# Patient Record
Sex: Female | Born: 1935 | Race: White | Hispanic: No | Smoking: Former smoker
Health system: Southern US, Community
[De-identification: ages and names within clinical notes are randomized; demographics above are authoritative.]

## PROBLEM LIST (undated history)

## (undated) DIAGNOSIS — M810 Age-related osteoporosis without current pathological fracture: Secondary | ICD-10-CM

## (undated) DIAGNOSIS — I10 Essential (primary) hypertension: Secondary | ICD-10-CM

## (undated) DIAGNOSIS — J189 Pneumonia, unspecified organism: Secondary | ICD-10-CM

## (undated) DIAGNOSIS — R410 Disorientation, unspecified: Secondary | ICD-10-CM

## (undated) DIAGNOSIS — Z9981 Dependence on supplemental oxygen: Secondary | ICD-10-CM

## (undated) DIAGNOSIS — K529 Noninfective gastroenteritis and colitis, unspecified: Secondary | ICD-10-CM

## (undated) DIAGNOSIS — S3210XA Unspecified fracture of sacrum, initial encounter for closed fracture: Secondary | ICD-10-CM

## (undated) DIAGNOSIS — M1990 Unspecified osteoarthritis, unspecified site: Secondary | ICD-10-CM

## (undated) DIAGNOSIS — S32000A Wedge compression fracture of unspecified lumbar vertebra, initial encounter for closed fracture: Secondary | ICD-10-CM

## (undated) DIAGNOSIS — J449 Chronic obstructive pulmonary disease, unspecified: Secondary | ICD-10-CM

## (undated) DIAGNOSIS — G8929 Other chronic pain: Secondary | ICD-10-CM

## (undated) DIAGNOSIS — M549 Dorsalgia, unspecified: Secondary | ICD-10-CM

## (undated) HISTORY — PX: ABDOMINAL HYSTERECTOMY: SHX81

---

## 1998-10-03 ENCOUNTER — Emergency Department (HOSPITAL_COMMUNITY): Admission: EM | Admit: 1998-10-03 | Discharge: 1998-10-03 | Payer: Self-pay | Admitting: Emergency Medicine

## 2000-03-31 ENCOUNTER — Encounter: Payer: Self-pay | Admitting: Emergency Medicine

## 2000-03-31 ENCOUNTER — Emergency Department (HOSPITAL_COMMUNITY): Admission: EM | Admit: 2000-03-31 | Discharge: 2000-03-31 | Payer: Self-pay | Admitting: Emergency Medicine

## 2000-04-01 ENCOUNTER — Emergency Department (HOSPITAL_COMMUNITY): Admission: EM | Admit: 2000-04-01 | Discharge: 2000-04-01 | Payer: Self-pay

## 2000-04-02 ENCOUNTER — Encounter: Payer: Self-pay | Admitting: Emergency Medicine

## 2000-04-02 ENCOUNTER — Emergency Department (HOSPITAL_COMMUNITY): Admission: EM | Admit: 2000-04-02 | Discharge: 2000-04-02 | Payer: Self-pay | Admitting: Emergency Medicine

## 2000-04-04 ENCOUNTER — Encounter: Payer: Self-pay | Admitting: Emergency Medicine

## 2000-04-04 ENCOUNTER — Emergency Department (HOSPITAL_COMMUNITY): Admission: EM | Admit: 2000-04-04 | Discharge: 2000-04-04 | Payer: Self-pay | Admitting: Emergency Medicine

## 2000-04-09 ENCOUNTER — Emergency Department (HOSPITAL_COMMUNITY): Admission: EM | Admit: 2000-04-09 | Discharge: 2000-04-09 | Payer: Self-pay | Admitting: Emergency Medicine

## 2000-06-05 ENCOUNTER — Encounter: Payer: Self-pay | Admitting: Emergency Medicine

## 2000-06-05 ENCOUNTER — Emergency Department (HOSPITAL_COMMUNITY): Admission: EM | Admit: 2000-06-05 | Discharge: 2000-06-05 | Payer: Self-pay | Admitting: Emergency Medicine

## 2001-05-25 ENCOUNTER — Emergency Department (HOSPITAL_COMMUNITY): Admission: EM | Admit: 2001-05-25 | Discharge: 2001-05-25 | Payer: Self-pay | Admitting: Emergency Medicine

## 2001-09-03 ENCOUNTER — Emergency Department (HOSPITAL_COMMUNITY): Admission: EM | Admit: 2001-09-03 | Discharge: 2001-09-03 | Payer: Self-pay | Admitting: Emergency Medicine

## 2001-09-03 ENCOUNTER — Encounter: Payer: Self-pay | Admitting: Emergency Medicine

## 2001-09-06 ENCOUNTER — Emergency Department (HOSPITAL_COMMUNITY): Admission: EM | Admit: 2001-09-06 | Discharge: 2001-09-06 | Payer: Self-pay

## 2001-09-11 ENCOUNTER — Emergency Department (HOSPITAL_COMMUNITY): Admission: EM | Admit: 2001-09-11 | Discharge: 2001-09-11 | Payer: Self-pay | Admitting: Emergency Medicine

## 2001-09-15 ENCOUNTER — Encounter: Payer: Self-pay | Admitting: Orthopedic Surgery

## 2001-09-15 ENCOUNTER — Ambulatory Visit (HOSPITAL_COMMUNITY): Admission: RE | Admit: 2001-09-15 | Discharge: 2001-09-15 | Payer: Self-pay | Admitting: Orthopedic Surgery

## 2001-10-02 ENCOUNTER — Emergency Department (HOSPITAL_COMMUNITY): Admission: EM | Admit: 2001-10-02 | Discharge: 2001-10-02 | Payer: Self-pay | Admitting: Emergency Medicine

## 2001-10-04 ENCOUNTER — Encounter: Admission: RE | Admit: 2001-10-04 | Discharge: 2001-10-04 | Payer: Self-pay | Admitting: Orthopedic Surgery

## 2001-10-04 ENCOUNTER — Encounter: Payer: Self-pay | Admitting: Orthopedic Surgery

## 2001-10-14 ENCOUNTER — Emergency Department (HOSPITAL_COMMUNITY): Admission: EM | Admit: 2001-10-14 | Discharge: 2001-10-14 | Payer: Self-pay

## 2001-10-18 ENCOUNTER — Encounter: Payer: Self-pay | Admitting: Orthopedic Surgery

## 2001-10-18 ENCOUNTER — Encounter: Admission: RE | Admit: 2001-10-18 | Discharge: 2001-10-18 | Payer: Self-pay | Admitting: Orthopedic Surgery

## 2001-10-21 ENCOUNTER — Emergency Department (HOSPITAL_COMMUNITY): Admission: EM | Admit: 2001-10-21 | Discharge: 2001-10-21 | Payer: Self-pay | Admitting: Emergency Medicine

## 2001-11-01 ENCOUNTER — Encounter: Admission: RE | Admit: 2001-11-01 | Discharge: 2001-11-01 | Payer: Self-pay | Admitting: Orthopedic Surgery

## 2001-11-01 ENCOUNTER — Encounter: Payer: Self-pay | Admitting: Orthopedic Surgery

## 2001-11-03 ENCOUNTER — Emergency Department (HOSPITAL_COMMUNITY): Admission: EM | Admit: 2001-11-03 | Discharge: 2001-11-03 | Payer: Self-pay | Admitting: Emergency Medicine

## 2001-11-09 ENCOUNTER — Emergency Department (HOSPITAL_COMMUNITY): Admission: EM | Admit: 2001-11-09 | Discharge: 2001-11-09 | Payer: Self-pay | Admitting: Emergency Medicine

## 2001-11-14 ENCOUNTER — Emergency Department (HOSPITAL_COMMUNITY): Admission: EM | Admit: 2001-11-14 | Discharge: 2001-11-14 | Payer: Self-pay | Admitting: Emergency Medicine

## 2001-11-21 ENCOUNTER — Emergency Department (HOSPITAL_COMMUNITY): Admission: EM | Admit: 2001-11-21 | Discharge: 2001-11-21 | Payer: Self-pay | Admitting: Emergency Medicine

## 2002-07-08 ENCOUNTER — Encounter: Payer: Self-pay | Admitting: Cardiology

## 2002-07-08 ENCOUNTER — Inpatient Hospital Stay (HOSPITAL_COMMUNITY): Admission: AD | Admit: 2002-07-08 | Discharge: 2002-07-10 | Payer: Self-pay | Admitting: Cardiology

## 2002-07-08 ENCOUNTER — Encounter: Payer: Self-pay | Admitting: Emergency Medicine

## 2002-07-09 ENCOUNTER — Encounter: Payer: Self-pay | Admitting: Cardiology

## 2002-08-14 ENCOUNTER — Emergency Department (HOSPITAL_COMMUNITY): Admission: EM | Admit: 2002-08-14 | Discharge: 2002-08-15 | Payer: Self-pay | Admitting: Emergency Medicine

## 2002-08-15 ENCOUNTER — Encounter: Payer: Self-pay | Admitting: Emergency Medicine

## 2002-08-16 ENCOUNTER — Emergency Department (HOSPITAL_COMMUNITY): Admission: EM | Admit: 2002-08-16 | Discharge: 2002-08-17 | Payer: Self-pay | Admitting: Emergency Medicine

## 2002-08-16 ENCOUNTER — Encounter: Payer: Self-pay | Admitting: Emergency Medicine

## 2003-02-24 ENCOUNTER — Encounter: Payer: Self-pay | Admitting: Emergency Medicine

## 2003-02-24 ENCOUNTER — Emergency Department (HOSPITAL_COMMUNITY): Admission: EM | Admit: 2003-02-24 | Discharge: 2003-02-24 | Payer: Self-pay | Admitting: Emergency Medicine

## 2004-01-13 ENCOUNTER — Encounter: Payer: Self-pay | Admitting: Emergency Medicine

## 2004-01-14 ENCOUNTER — Encounter (INDEPENDENT_AMBULATORY_CARE_PROVIDER_SITE_OTHER): Payer: Self-pay | Admitting: Cardiology

## 2004-01-14 ENCOUNTER — Inpatient Hospital Stay (HOSPITAL_COMMUNITY): Admission: EM | Admit: 2004-01-14 | Discharge: 2004-01-17 | Payer: Self-pay | Admitting: *Deleted

## 2004-01-25 ENCOUNTER — Inpatient Hospital Stay (HOSPITAL_COMMUNITY): Admission: EM | Admit: 2004-01-25 | Discharge: 2004-02-05 | Payer: Self-pay | Admitting: Psychiatry

## 2004-02-07 ENCOUNTER — Other Ambulatory Visit (HOSPITAL_COMMUNITY): Admission: RE | Admit: 2004-02-07 | Discharge: 2004-02-13 | Payer: Self-pay | Admitting: Psychiatry

## 2004-02-20 ENCOUNTER — Inpatient Hospital Stay (HOSPITAL_COMMUNITY): Admission: AD | Admit: 2004-02-20 | Discharge: 2004-02-29 | Payer: Self-pay | Admitting: Psychiatry

## 2004-02-22 ENCOUNTER — Encounter: Payer: Self-pay | Admitting: Psychiatry

## 2004-03-03 ENCOUNTER — Other Ambulatory Visit (HOSPITAL_COMMUNITY): Admission: RE | Admit: 2004-03-03 | Discharge: 2004-03-11 | Payer: Self-pay | Admitting: Psychiatry

## 2004-03-15 ENCOUNTER — Inpatient Hospital Stay (HOSPITAL_COMMUNITY): Admission: EM | Admit: 2004-03-15 | Discharge: 2004-03-25 | Payer: Self-pay | Admitting: Psychiatry

## 2004-03-15 ENCOUNTER — Emergency Department (HOSPITAL_COMMUNITY): Admission: EM | Admit: 2004-03-15 | Discharge: 2004-03-15 | Payer: Self-pay

## 2005-01-27 ENCOUNTER — Inpatient Hospital Stay (HOSPITAL_COMMUNITY): Admission: EM | Admit: 2005-01-27 | Discharge: 2005-02-02 | Payer: Self-pay | Admitting: Emergency Medicine

## 2005-02-16 ENCOUNTER — Inpatient Hospital Stay (HOSPITAL_COMMUNITY): Admission: EM | Admit: 2005-02-16 | Discharge: 2005-02-24 | Payer: Self-pay | Admitting: Emergency Medicine

## 2005-02-20 ENCOUNTER — Encounter: Payer: Self-pay | Admitting: Internal Medicine

## 2005-02-20 ENCOUNTER — Encounter (INDEPENDENT_AMBULATORY_CARE_PROVIDER_SITE_OTHER): Payer: Self-pay | Admitting: *Deleted

## 2005-03-02 ENCOUNTER — Ambulatory Visit: Payer: Self-pay | Admitting: Pulmonary Disease

## 2005-03-05 ENCOUNTER — Ambulatory Visit (HOSPITAL_COMMUNITY): Admission: RE | Admit: 2005-03-05 | Discharge: 2005-03-05 | Payer: Self-pay | Admitting: Interventional Radiology

## 2005-03-13 ENCOUNTER — Ambulatory Visit (HOSPITAL_COMMUNITY): Admission: RE | Admit: 2005-03-13 | Discharge: 2005-03-13 | Payer: Self-pay | Admitting: Interventional Radiology

## 2005-03-24 ENCOUNTER — Ambulatory Visit (HOSPITAL_COMMUNITY): Admission: RE | Admit: 2005-03-24 | Discharge: 2005-03-24 | Payer: Self-pay | Admitting: Interventional Radiology

## 2005-04-06 ENCOUNTER — Ambulatory Visit (HOSPITAL_COMMUNITY): Admission: RE | Admit: 2005-04-06 | Discharge: 2005-04-06 | Payer: Self-pay | Admitting: Interventional Radiology

## 2005-04-10 ENCOUNTER — Encounter: Admission: RE | Admit: 2005-04-10 | Discharge: 2005-04-10 | Payer: Self-pay | Admitting: Interventional Radiology

## 2005-11-15 ENCOUNTER — Emergency Department (HOSPITAL_COMMUNITY): Admission: EM | Admit: 2005-11-15 | Discharge: 2005-11-16 | Payer: Self-pay | Admitting: Emergency Medicine

## 2006-01-18 ENCOUNTER — Ambulatory Visit (HOSPITAL_COMMUNITY): Admission: RE | Admit: 2006-01-18 | Discharge: 2006-01-18 | Payer: Self-pay | Admitting: Interventional Radiology

## 2006-01-19 ENCOUNTER — Ambulatory Visit (HOSPITAL_COMMUNITY): Admission: RE | Admit: 2006-01-19 | Discharge: 2006-01-19 | Payer: Self-pay | Admitting: Interventional Radiology

## 2006-06-05 ENCOUNTER — Inpatient Hospital Stay (HOSPITAL_COMMUNITY): Admission: EM | Admit: 2006-06-05 | Discharge: 2006-06-11 | Payer: Self-pay | Admitting: Emergency Medicine

## 2007-05-21 ENCOUNTER — Ambulatory Visit: Payer: Self-pay | Admitting: *Deleted

## 2007-05-21 ENCOUNTER — Inpatient Hospital Stay (HOSPITAL_COMMUNITY): Admission: EM | Admit: 2007-05-21 | Discharge: 2007-05-24 | Payer: Self-pay | Admitting: Emergency Medicine

## 2008-05-08 ENCOUNTER — Emergency Department (HOSPITAL_COMMUNITY): Admission: EM | Admit: 2008-05-08 | Discharge: 2008-05-08 | Payer: Self-pay | Admitting: Emergency Medicine

## 2008-05-10 ENCOUNTER — Emergency Department (HOSPITAL_COMMUNITY): Admission: EM | Admit: 2008-05-10 | Discharge: 2008-05-10 | Payer: Self-pay | Admitting: Emergency Medicine

## 2008-05-14 ENCOUNTER — Encounter: Payer: Self-pay | Admitting: Interventional Radiology

## 2008-05-15 ENCOUNTER — Ambulatory Visit (HOSPITAL_COMMUNITY): Admission: RE | Admit: 2008-05-15 | Discharge: 2008-05-15 | Payer: Self-pay | Admitting: Interventional Radiology

## 2008-05-25 ENCOUNTER — Ambulatory Visit (HOSPITAL_COMMUNITY): Admission: RE | Admit: 2008-05-25 | Discharge: 2008-05-25 | Payer: Self-pay | Admitting: Interventional Radiology

## 2010-03-13 ENCOUNTER — Encounter: Payer: Self-pay | Admitting: Interventional Radiology

## 2011-01-18 ENCOUNTER — Encounter: Payer: Self-pay | Admitting: Interventional Radiology

## 2011-05-12 NOTE — Consult Note (Signed)
NAME:  GRIER, CZERWINSKI NO.:  [id number]   MEDICAL RECORD NO.:  [id number]          PATIENT TYPE:  INP   LOCATION:  1827                         FACILITY:  [facility]   PHYSICIAN:  Georga Hacking, M.D.DATE OF BIRTH:  [birth year]   DATE OF CONSULTATION:  DATE OF DISCHARGE:                                 CONSULTATION   Thank you for asking me to see this 75 year old female for evaluation of  chest pain.  The patient has a longstanding history of tobacco abuse,  cigarette abuse, and severe chronic low back pain with multiple  kyphoplasties.  She was evaluated by me in January, 2005 with  significant sharp substernal chest pain and arm pain.  During that  admission, she underwent a cardiac catheterization that showed mild-to-  moderate diffuse irregularity in the coronary arteries with no  significant obstructive stenoses noted and normal LV function.  Following that, she was later admitted to the psychiatry service for  depression in March of that year.  She has had admissions for  kyphoplasty and management of chronic back pain and about a year and a  half ago was admitted with severe shortness of breath and was diagnosed  with COPD and has been on home oxygen.  She states that she does not  feel well, she has no energy.  Complains of significant low back pain  and chronic shortness of breath but not much in the way of chest pain.  She lives with her son currently and complains of poor motivation to do  activities or other things.   She was awakened from sleep this morning at about 4:00 a.m. with a sharp  chest pain that she said would recur and last around 6 seconds and  recurred over the course of about 3-4 hours.  She had no sweating with  this and described it as sharp rather than pressure-like.  She complains  of some numbness involving the left arm, which frightened her, and she  was brought by EMS to the emergency room, where her symptoms gradually  subsided.   Her initial point of care enzymes and EKG have been negative  since then.  Her symptoms are not pleuritic and are not related to food.   Her past history is remarkable for hyperlipidemia and COPD.   PAST SURGICAL HISTORY:  Hysterectomy in the 1970s, bilateral  oophorectomy and appendectomy.   ALLERGIES:  None.   CURRENT MEDICATIONS:  She is on a couple of medicines for blood  pressure, she says.  Aspirin, atenolol, and evidently is on Cymbalta for  depression.  She evidently uses some inhalers also.   SOCIAL HISTORY:  She currently lives with her son.  She does not use  alcohol to excess.  She has smoked for a number of years and is  currently smoking around three cigarettes a day and is trying to reduce  the amount she is smoking.   FAMILY HISTORY:  Mother died at age 28 due to a brain tumor.  Father  died at age 33 of emphysema.  Three sisters, one sister with a  stroke at  age 48.  No family history of premature cardiac disease at an early age.  There is a family history of suicide and depression.   REVIEW OF SYSTEMS:  She has gained some weight.  She has significant  depressed and anxiety.  She has a chronic sleep disturbance.  She has  mild-to-moderate dyspnea on exertion.  She denies hematochezia,  hematemesis, gallbladder disease, or hiatal hernia but does have  occasional diarrhea.  She denies any urinary symptoms.  She has severe  chronic back pain and has had kyphoplasties done previously.   PHYSICAL EXAMINATION:  GENERAL:  On examination, she is an elderly woman  appearing her stated age.  VITAL SIGNS:  Blood pressure is currently 120/80.  Pulse was 76 and  regular.  SKIN:  Warm and dry.  HEENT:  EOMI.  PERRLA. C&S clear, Fundi not examined.  Pharynx negative.  NECK:  Supple without masses, JVD, thyromegaly, or bruits.  LUNGS:  Clear to A&P.  CARDIAC:  Normal S1 and S2.  No S3.  ABDOMEN:  Soft and nontender.  EXTREMITIES:  Femoral and distal pulses are 2+.  There is  no edema  noted.   A 12-lead EKG is normal.   Initial laboratory data shows normal CBC.  The pCO2 is 54.  Potassium is  2.9.  Glucose is 103.  Albumin is 3.3.  Initial point-of-care enzymes  were negative.  Drug screen is positive for opiates.   IMPRESSION:  1. Prolonged chest discomfort with atypical features with history of      insignificant coronary artery disease three years ago.  2. Chronic obstructive pulmonary disease with ongoing cigarette      smoking.  3. History of depression and anxiety.  4. Severe chronic low back pain with previous kyphoplasties.   RECOMMENDATIONS:  The pain is atypical at this time.  If her enzymes  remain negative, I would screen her with a Cardiolite stress test.  I  might also consider a gallbladder ultrasound to look for other sources  of chest pain.   Thank you for asking me to see her with you.      Georga Hacking, M.D.  Electronically Signed     WST/MEDQ  D:  05/21/2007  T:  05/21/2007  Job:  [id number]   cc:   Windle Guard, M.D.

## 2011-05-12 NOTE — Consult Note (Signed)
NAME:  [name], [name]                 ACCOUNT NO.:  [id number]   MEDICAL RECORD NO.:  [id number]          PATIENT TYPE:  OUT   LOCATION:  XRAY                         FACILITY:  [facility]   PHYSICIAN:  Sanjeev K. Deveshwar, M.D.DATE OF BIRTH:  [birth year]   DATE OF CONSULTATION:  05/14/2008  DATE OF DISCHARGE:                                 CONSULTATION   CHIEF COMPLAINT:  Back pain.   HISTORY OF PRESENT ILLNESS:  This is a pleasant 75 year old female known  to Dr. Corliss Skains in the past.  She was seen for an L3 compression  fracture in February 2006, and at that time, underwent a kyphoplasty at  that level.  She had recurrent pain, and in March 2006, she had  additional cement placed at the L3 level during a vertebroplasty.  This  resolved her pain.  The patient did well until approximately 1 week ago  when she was trying to raise a window that was stuck in her house, she  pulled very hard on the window and heard a pop in her back, at which  time, she developed severe back pain.  She went to the emergency room  where she had plain x-rays and was told that she had muscle strain.  She  was given a prescription for Percocet and sent home.  She is now out of  Percocet and continues to have 10/10 pain.  She says her pain is worse  when trying to get up or get out of bed.  It is better with ambulation.  She does have a history of chronic low back pain, but this is much worse  than what she normally experiences.  She is quite debilitated by the  pain.  She presents today to be seen in consultation by Dr. Corliss Skains.   PAST MEDICAL HISTORY:  Significant for:  1. Hypertension.  2. COPD.  She uses home oxygen.  3. Gastroesophageal reflux disease.  4. History of chronic back pain.  5. History of anxiety and depression.  6. She had a Cardiolite in June 2002 that was negative for ischemia.      Her ejection fraction was 65%.   PAST SURGICAL HISTORY:  Significant for:  1. Hysterectomy.  2.  Hemorrhoidectomy.  3. The above noted previous KP and VP of the L3 level in 2006.   ALLERGIES:  No known drug allergies.   MEDICATIONS:  The patient did not bring her medications to this visit,  but she believes she is on atenolol, hydrochlorothiazide, and Cymbalta.   SOCIAL HISTORY:  The patient is divorced.  She has 2 children.  She  lives in [place].  Her son lives with her.  She still smokes 1 pack  of cigarettes per day and has done so for greater than 20 years.  She  does not use alcohol.  She is a retired Theatre manager.   FAMILY HISTORY:  Her mother died at age 75 from a brain tumor.  Her  father died in his 34s from COPD.   IMPRESSION AND PLAN:  As noted, the patient presents today for  further  evaluation of back pain.  She was examined by Dr. Corliss Skains who felt  that the pain was probably at about the T8 level.  He is concerned that  she does have a new fracture.  We have ordered an MRI scan.  If the MRI  shows an acute or subacute fracture, we will set her up for a  kyphoplasty or vertebroplasty procedure.  She also requested pain  medication.  We did give her Percocet to be taken 1-2 q.6 h. p.r.n.,  #20, no refill.   Greater than 30 minutes were spent on this consult.      Delton See, P.A.    ______________________________  Grandville Silos. Corliss Skains, M.D.    DR/MEDQ  D:  05/14/2008  T:  05/15/2008  Job:  [id number]   cc:   Windle Guard, M.D.

## 2011-05-15 NOTE — H&P (Signed)
[name], [name] NO.:  [id number]   MEDICAL RECORD NO.:  [id number]          PATIENT TYPE:  EMS   LOCATION:  MAJO                         FACILITY:  [facility]   PHYSICIAN:  Michelene Gardener, MD    DATE OF BIRTH:  Oct 24, 1936   DATE OF ADMISSION:  06/04/2006  DATE OF DISCHARGE:                                HISTORY & PHYSICAL   PRIMARY CARE PHYSICIAN:  Vikki Ports, M.D.   CHIEF COMPLAINT:  The patient fell today.   HISTORY OF PRESENT ILLNESS:  This is a 75 year old Caucasian female with a  history of COPD and chronic back pain who presented with the above mentioned  complaint.  The patient stated that she walked out of her of closet and then  she lost her balance and then she fell down.  She did not lose her  consciousness and she has fell down many times in the last view months.  After she fell down, she hit the back of her head.  She hit her back and her  hips and she felt pain all over her body, especially her hip and her back  and extremities.  She came to the ER where she was evaluated.  X-rays were  done and returned negative for fractures. The patient was about to be sent  home but she had feeling of very bad as she cannot stand up so the  hospitalist service was called for further evaluation.   PAST MEDICAL HISTORY:  Her past medical history is significant for:  1.  History of COPD and emphysema.  2.  History of chronic back pain.  3.  History of hypertension.  4.  Depression.   MEDICATIONS:  1.  Atenolol, unknown dose.  2.  Hydrochlorothiazide, unknown dose.  3.  Prozac, unknown dose.   PAST SURGICAL HISTORY:  Denies.   SOCIAL HISTORY:  The patient smokes one pack per day.  She said that she  smoked since she was 75 years old.  Sometimes she stops smoking but she goes  back to smoking.  She does not drink alcohol and denies history of  recreational drugs.   FAMILY HISTORY:  No hypertension, no diabetes and no coronary artery   disease.   ALLERGIES:  No known drug allergies.   REVIEW OF SYSTEMS:  Significant for fall and generalized muscular pain and  some headaches.  CONSTITUTIONAL:  There is no fever, no weight changes.  EYES:  No blurred vision, no pain, no redness.  ENT:  No ear pain, no  hearing loss or epistaxis.  No visible swelling.  RESPIRATORY:  No cough, no  wheezes, no epistaxis.  No hemoptysis.  CARDIOVASCULAR:  No chest pain, no  orthopnea, no edema, and no palpitations.  GASTROINTESTINAL:  No nausea, no  vomiting, no diarrhea, no abdominal pain, no constipation.  GENITOURINARY:  No dysuria, no hematuria, no frequency.  ENDOCRINE:  No polyuria, no  nocturia, no sweating.  HEMATOLOGY:  No bruises, no bleeding.  INFECTIOUS  DISEASE:  No rashes, no lesions.  NEUROLOGICAL:  No numbness or tingling.  No  weakness, no dysarthria, and no tremors.  The rest of the systems were  reviewed, and they were negative.   PHYSICAL EXAMINATION:  VITAL SIGNS:  Temperature is 96.5, blood pressure  119/73, pulse is 69, respiratory rate 20.  GENERAL APPEARANCE:  She is an elderly Caucasian female who is lying down in  bed and looks in pain.  HEENT:  Conjunctivae are normal.  There is no pallor and no erythema.  Pupils are equal to reactive to light and accommodation.  There is no  ptosis.  Hearing is intact.  There is no ear discharge or infection.  There  is no nose discharge, infection, or bleeding.  Oral mucosa is dry, and there  is no pharyngeal erythema.  NECK:  Supple.  There is no JVD, no carotid bruits, no lymphadenopathy, no  thyroid enlargement or thyroid tenderness.  CARDIOVASCULAR:  S1 and S2 were heard.  There are no additional heart  sounds.  There are no murmurs, no gallops, and no thrills.  RESPIRATORY:  The patient is breathing at 16 to 18.  There is no use of  accessory muscles, no intercostal retractions, and no dullness.  No rales,  no rhonchi, and no wheezes.  ABDOMEN:  The abdomen is soft,  nondistended.  No tenderness.  No  hepatosplenomegaly.  Bowel sounds are normal.  Umbilicus is central.  EXTREMITIES:  Lower extremities had no edema, no rash, and no varicose  veins.  MUSCULOSKELETAL:  There is tenderness in her lower back with some tenderness  in her lower extremities.  The patient has multiple other points of  tenderness.  NEUROLOGIC:  Cranial nerves are intact, II-XII.  There are no motor or  sensory deficits.  PSYCHIATRIC:  The patient is alert and oriented x3.  She has poor insight.  The patient is anxious about her current medical condition.   LABORATORY DATA:  No labs were drawn.   RADIOLOGY:  X-rays of the back and hip returned to be normal.   IMPRESSION/ASSESSMENT:  1.  Weakness and multiple falls.  This patient will be admitted to the      hospital because she is still feeling very weak, and she is not able to      stand.  In addition, she is having very severe pain which is not      relieved by pain medications.  I will put her on regular floor.  I will      try to find some pain medication.  I will also get physical therapy for      further evaluation.  I think this patient might need to get some      assistance, either home health assistance or some kind of short term      rehabilitation if agreed by physical therapy.  2.  Hypertension.  Will continue on current medications and follow her blood      pressure.  3.  Chronic back pain.  As mentioned, will also give her some pain      medications and get physical therapy for further evaluation.  4.  Depression.  Will continue her present medications.  5.  Tobacco abuse.  The patient is advised about the risks of smoking      including coronary artery disease, COPD and different  malignancies.   Assessment time is 55 minutes.      Michelene Gardener, MD  Electronically Signed     NAE/MEDQ  D:  06/05/2006  T:  06/05/2006  Job:  [id number]   cc:   Vikki Ports, M.D.  Fax: [phone]

## 2011-05-15 NOTE — Discharge Summary (Signed)
NAMEJENNESIS, [name] NO.:  [id number]   MEDICAL RECORD NO.:  [id number]          PATIENT TYPE:  OUT   LOCATION:  XRAY                         FACILITY:  [facility]   PHYSICIAN:  Jackie Plum, M.D.DATE OF BIRTH:  1936/05/15   DATE OF ADMISSION:  02/20/2005  DATE OF DISCHARGE:  02/20/2005                                 DISCHARGE SUMMARY   ADDENDUM:  Patient was seen yesterday by me.  She had moderate-to-severe  back pain; however, her kyphoplasty site looked okay.  I therefore added a  fentanyl patch of 10 mcg q.72h. to her medication regimen, and this morning,  this seems to have helped remarkably.  She has gotten enough adequate pain  relief, and she feels ready for discharge today.   Her discharge labs are WBC count 8.4, hemoglobin 12, hematocrit 35.1, MCV  87.6, platelet count 466.  Sodium 139, potassium 3.9, chloride 101, CO2 29,  glucose 227, BUN 17, creatinine 0.9, calcium 8.2.   She is discharged to follow up with Dr. Theresia Lo in 7-10 days.  She will  have [facility] to follow up with her and will be calling regards  appointment for any questions regarding her kyphoplasty.      GO/MEDQ  D:  02/24/2005  T:  02/24/2005  Job:  [id number]

## 2011-05-15 NOTE — Discharge Summary (Signed)
NAMECARON, ODE NO.:  [id number]   MEDICAL RECORD NO.:  [id number]          PATIENT TYPE:  INP   LOCATION:  0357                         FACILITY:  [facility]   PHYSICIAN:  Hollice Espy, M.D.DATE OF BIRTH:  1936-05-07   DATE OF ADMISSION:  01/27/2005  DATE OF DISCHARGE:                                 DISCHARGE SUMMARY   PRIMARY CARE PHYSICIAN:  Patient is wanting to change primary care  physicians and we will forward this copy to her when she has established a  new primary care doctor.   DISCHARGE DIAGNOSES:  1.  Chronic obstructive pulmonary disease with acute exacerbation and      chronic hypoxia, requiring continuous home oxygen.  2.  Anxiety.  3.  Gastroesophageal reflux disease.  4.  Tobacco abuse.   DISCHARGE MEDICATIONS:  1.  Prednisone taper 50 mg p.o. b.i.d.  Decrease by 10 mg b.i.d. until      finished.  2.  Albuterol 2 puffs 4 times a day.  3.  Advair 250/50 1 puff b.i.d.  4.  Protonix 40 daily.  5.  Patient will also be on continuous home oxygen and is receiving a      prescription for Percocet 5/225 1 p.o. q.6h. p.r.n., total of #12.   HOSPITAL COURSE:  Patient is a 75 year old African-American female with a  past medical history of tobacco abuse, who has presented on January 27, 2005  with complaints of severe shortness of breath.  A chest x-ray was done but  showed no evidence of any pneumonia.  A CT was done which showed no evidence  of any pulmonary embolus.  The patient admitted to a heavy 24-pack-year  smoking history, which is ongoing.  She presented extremely hypoxic with an  O2 sat of 77%.  She was started on IV fluids, oxygen, and antibiotics as  well as nebulizing treatments.  An ABG was done, which confirmed her  hypoxia, with a pO2 at approximately 40.  The patient's CPK was slightly  elevated at 250.  Subsequent sets were negative and her troponin level was  not elevated.  This was felt to be mild elevation secondary  to  musculoskeletal breakdown from heavy coughing.  Patient initially also had a  white count of 15.6.  This improved during the course of her hospital stay.  The patient was continued to be treated and repeat ABGs confirmed that she  remained hypoxic.  A follow-up chest x-ray showed no evidence of any new  pneumonia.  It was felt the patient had chronic COPD with hypoxia and would  likely need continuous home oxygen over the next few days.  She continued to  have severe wheezing and was started on IV steroids.  In addition, a proton  pump inhibitor was added to minimize any silent aspiration from GERD.  Patient tolerated the steroids well, and home oxygen was set up through home  health following.  Patient tolerated the oxygen as well.  Prior to  discharge, she was ambulated in the hallway and saturated at 87% on room air  after five  minutes.  After oxygen was placed, her O2 saturations climbed to  92%.  It was felt that these numbers were enough to qualify her for home  oxygen.   Patient was symptomatically stable for discharge on February 02, 2005.  She  is doing well.  The plan will be to continue her prednisone until she is  completed the full 7-day course of antibiotics.  She will need continuous  home O2 as well as inhalers.  She tells me that she will indeed give up  cigarette smoking.  I have offered her a number of options, including  classes, patches, and gum, but she declines, saying that she will be able to  do it on her willpower because she says she does not want to stay on oxygen  forever, and she will not smoke while on oxygen, knowing that it can cause a  fire.   Patient is being discharged to home.  Her disposition is improved.  Her diet  will be a regular diet.  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  She has  requested that she would like a new physician to follow her as a PCP, and I  have given her numbers and contact information to establish with a new  primary  doctor.      SKK/MEDQ  D:  02/02/2005  T:  02/02/2005  Job:  [id number]

## 2011-05-15 NOTE — Consult Note (Signed)
NAMEMICHAELLA, IMAI NO.:  [id number]   MEDICAL RECORD NO.:  [id number]          PATIENT TYPE:  INP   LOCATION:  3009                         FACILITY:  [facility]   PHYSICIAN:  Antonietta Breach, M.D.  DATE OF BIRTH:  Sep 08, 1936   DATE OF CONSULTATION:  06/08/2006  DATE OF DISCHARGE:                                   CONSULTATION   PSYCHIATRIC CONSULTATION   REFERRING M.D.:  Jannette Spanner, M.D.   REASON FOR CONSULTATION:  Bizarre behavior.   HISTORY OF PRESENT ILLNESS:  Mrs. Ojo is a 75 year old female admitted on  June 8 after a fall at home.  She did not receive any fractures.  However,  she has had several falls.  She continues to be impulsive regarding her  safety.  She gets up without assistance and then repeatedly falls.  She has  pulled her IVs out on the floor, and she continues to complain of intense  feeling on edge and pain.  Recent stresses include a 77 year old son who has  had a stroke and has moved in with her since March, along with her 17-year-  old grandson.  The patient has been overwhelmed by this new stress on top of  her general medical problems.  She has poor sleep, severe feeling on edge,  poor energy and lack of interest.  There are no suicidal thoughts and no  homicidal thoughts.  No delusions and no hallucinations.   PAST PSYCHIATRIC HISTORY:  The patient has been tried on Paxil in the past.  She was admitted to [facility] in 2005.  She does have a  history of psychotic features during depression.   PAST PSYCHIATRIC MEDICATIONS:  Zyprexa, Neurontin, Celexa, Ativan and Paxil.   FAMILY PSYCHIATRIC HISTORY:  None known.   SOCIAL HISTORY:  Marital:  Widowed.  Children:  One son, see above and a  grandson.  Alcohol:  None.  Illegal drugs:  None.   GENERAL MEDICAL PROBLEMS:  1.  COPD.  2.  Recurrent falls.  3.  Chronic back pain.  4.  Hypertension.   MEDICATIONS:  MAR is reviewed.  Psychotropics include  Prozac.   LABORATORY DATA:  CBC is within normal limits.  LFTs within normal limits.  Chest x-ray shows cardiomegaly with pulmonary vascular congestion.  Head CT  is negative for acute and has chronic to small-vessel disease with mild  atrophy.   REVIEW OF SYSTEMS:  CONSTITUTIONAL:  No fever.  HEAD: No trauma to the head.  EYES: No visual difficulty.  ENT:  No sore throat.  RESPIRATIONS:  No cough.  CV:  No chest pain.  GI:  No nausea, vomiting or diarrhea.  GU:  No dysuria.  MUSCULOSKELETAL:  No deformities, weaknesses or atrophy.  HEMATOLOGIC/LYMPHATIC:  Unremarkable.  NEUROLOGICAL:  As above.  PSYCHIATRIC:  As above.  ENDOCRINE/METABOLIC:  Unremarkable.   EXAMINATION:  VITAL SIGNS:  Temperature 97.6, pulse 86, respirations 20,  blood pressure 116/61and O2 sat 95% on room air.  MENTAL STATUS EXAM:  Mrs. Mcfayden is an elderly female who is alert and  cooperative at  the time of exam.  She does have some pressured speech and is  sitting up in a hospital chair.  She talks much about the recent stresses  mentioned in the history of present illness.  By content:  The patient  states that she never feels good.  She has no thoughts of harming herself,  no thoughts of harming others, no delusions and no hallucinations.  Her  affect is anxious.  Her concentration is decreased.  Her insight is limited  and she is unable to appreciate the impact of her medical problems and the  concomitant risks.  On memory testing, she acknowledges she has had trouble  remembering how to take her medications.  Formal testing:  3/3 immediate and  2/3 on recall.   Judgment in paired.  Fund of knowledge and intelligence average.   ASSESSMENT:  AXIS I:  Anxiety disorder not otherwise specified, 293.84.  Mood disorder not otherwise specified, depressed, 293.83, rule out major  depressive disorder, severe, 296.33.   AXIS II:  Deferred.   AXIS III:  See general medical problems above.   AXIS IV:  General medical  and primary support group.   AXIS V:  45.   RECOMMENDATIONS:  Would discontinue the Prozac and resume the Paxil at 20 mg  p.o. daily.   DISCUSSION:  The patient has critical deficits, and appreciation of her  medical problems and the concomitant risks, and she also has difficulty  reasoning.  She does not have the capacity for informed consent.      Antonietta Breach, M.D.  Electronically Signed     JW/MEDQ  D:  06/10/2006  T:  06/10/2006  Job:  [id number]

## 2011-05-15 NOTE — Discharge Summary (Signed)
NAME:  [name], [name]                           ACCOUNT NO.:  [id number]   MEDICAL RECORD NO.:  [id number]                   PATIENT TYPE:  IPS   LOCATION:  0307                                 FACILITY:  [facility]   PHYSICIAN:  Jeanice Lim, M.D.              DATE OF BIRTH:  06-05-1936   DATE OF ADMISSION:  03/15/2004  DATE OF DISCHARGE:  03/25/2004                                 DISCHARGE SUMMARY   IDENTIFYING DATA:  This is a 75 year old Caucasian female voluntarily  admitted.  Had been here several times relatively recently.  I had been  actually attending in IOP for the last two weeks and yesterday felt her  depression had escalated and starting to hear voices again.  Realized that  she had no incentive to live and that she wished that God would take her.  She went to the emergency room.  Felt that her family has abandoned her.  Her 23 year old son had been spending the night at her house to help her  feel safe and he was to be in this residence for about two years.  The  patient apparently had difficulty with her income and affording utilities,  having trouble even affording food.   MEDICATIONS:  Vicoprofen, Nitrostat p.r.n., K-Dur.  Had been seen by Dr.  Ladona Ridgel and adjusted her medications to Zyprexa, Neurontin, Celexa, Colace,  Combivent inhaler, Ativan.   ALLERGIES:  No known drug allergies.   PHYSICAL EXAMINATION:  Essentially within normal limits.  Neurologically  nonfocal.  The patient had breath sounds consistent with emphysema on  physical examination.   LABORATORY DATA:  Routine admission labs essentially within normal limits.   MENTAL STATUS EXAM:  Alert and oriented, well-groomed, casually dressed.  Speech not pressured.  Mood somewhat labile, tearful.  Affect full.  Cognition intact.  Some distractibility.  Judgment and insight fair.  No  paranoia.  The patient reported hearing voices.  However, they are not  command in nature.  They appear to be more related to  sounds that she would  hear, which were somewhat not typical of true psychotic symptoms and there  was a question of whether this could be a form of tinnitus due to the  description of how this had developed.  However, they are very distressing  to her because she interpreted them as symptoms that she was going crazy.   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   AXIS II:  Deferred.   AXIS III:  1. Emphysema.  2. Hypertension.   AXIS IV:  Severe (limited support system, economic stressors).   AXIS V:  35/55.   HOSPITAL COURSE:  The patient was admitted and ordered routine p.r.n.  medications and underwent further monitoring.  Was encouraged to participate  in individual, group and milieu therapy.  The patient was resumed on  medications and  given trazodone to restore sleep.  Medications were  optimized and Valium discontinued and Ativan restarted due to patient's  previous difficulty tolerating Klonopin, which eventually caused  oversedation.  Restoril was titrated due to sedation with Zyprexa and  patient reported a decrease in the noises, although these continued to awake  her at times and, again, went quite typical of psychotic symptoms related to  depression.  The patient was agreeable to seeing the ENT regarding these  complaints and she was optimized on Celexa targeting depressive symptoms.  She showed improvement with clinical intervention.   CONDITION ON DISCHARGE:  She was discharged in significantly improved  condition.  Mood was much less depressed.  Affect full.  No longer labile  and mood overall even-keel.  Thought processes goal directed.  Increased  coping skills as well as denial of any acute psychotic symptoms and no  dangerous ideation including no passive suicidal ideation.  The patient was  given medication education.   DISCHARGE MEDICATIONS:  1. Neurontin 400 mg, 2 q.h.s.  2.  Restoril 7.5 mg, 3 q.h.s.  3. Zyprexa 15 mg q.h.s.  4. Celexa 40 mg q.a.m.  5. Ambien 10 mg q.h.s.  6. Aspirin 81 mg q.a.m.  7. Colace 100 mg b.i.d.  8. Combivent inhaler 2 puffs b.i.d.  9. Hydrochlorothiazide 25 mg q.a.m.  10.      Ativan 0.5 mg, 1/2 t.i.d. and 1 q.h.s.  11.      OxyContin 10 mg q.12h. p.r.n. pain.  12.      Vicoprofen 7.5\200 mg, 1-1/2 t.i.d. p.r.n. and q.h.s.   FOLLOW UP:  The patient was to follow up with outpatient services at  [facility] to restart IOP.   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   AXIS II:  Deferred.   AXIS III:  1. Emphysema.  2. Hypertension.   AXIS IV:  Severe (limited support system, economic stressors).   AXIS V:  Global Assessment of Functioning on discharge 55.                                               Jeanice Lim, M.D.    JEM/MEDQ  D:  04/27/2004  T:  04/28/2004  Job:  [id number]

## 2011-05-15 NOTE — Discharge Summary (Signed)
[name], [name] NO.:  [id number]   MEDICAL RECORD NO.:  [id number]          PATIENT TYPE:  INP   LOCATION:  3009                         FACILITY:  [facility]   PHYSICIAN:  Jackie Plum, M.D.DATE OF BIRTH:  01/04/1936   DATE OF ADMISSION:  06/04/2006  DATE OF DISCHARGE:  06/11/2006                                 DISCHARGE SUMMARY   DISCHARGE DIAGNOSES:  1. Falls . Discharge home with home health.  PT and OT to continue      therapy.  2. Chronic obstructive pulmonary disease.  3. Tobacco abuse.   DISCHARGE MEDICATIONS:  Patient was to continue on her pre admission  medications.  Please see admission H and P.  Ambien 5mg  po q.h.s. and Percocet 1 or 2 tablets q.4 hours p.r.n. were  prescribed for patient.   FOLLOWUP:  Patient has followup with PCP, Dr. Theresia Lo, in 2-3 weeks and she  is to report to MD for assistance with any problems.  Her diet was  __________  diet.   REASON FOR ADMISSION:  Fall.  Patient presented to ED after a fall episode.  She is said to have been feeling weak.  X-rays were done which were negative  .  She was seen by Jannette Spanner who, in evaluation, noted that the patient  was clinically dehydrated.  Her cardiopulmonary exam at admission revealed  no acute changes.  Abdomen was soft, non tender and she had some tenderness  in her lower back  .  She was admitted for weakness and multiple falls.  On  admission patient was seen by PT who worked with the patient and felt that  she would benefit from home PT and OT.  She was also seen by a psychiatrist  who felt that patient has anxiety disorder.  __________  and medication  regimen.  Hypertension was followed carefully.  She was continued on her  home antihypertensive medications and she was subsequently discharged with  significant improvement on June 11, 2006 in stable, satisfactory condition.      Jackie Plum, M.D.  Electronically Signed     GO/MEDQ  D:  09/11/2006   T:  09/11/2006  Job:  [id number]

## 2011-05-15 NOTE — Discharge Summary (Signed)
NAME:  [name], [name]                           ACCOUNT NO.:  [id number]   MEDICAL RECORD NO.:  [id number]                   PATIENT TYPE:  IPS   LOCATION:  0505                                 [facility]   PHYSICIAN:  Jeanice Lim, M.D.              DATE OF BIRTH:  06-Jan-1936   DATE OF ADMISSION:  02/20/2004  DATE OF DISCHARGE:  02/29/2004                                 DISCHARGE SUMMARY   IDENTIFYING DATA:  This was one of multiple admissions during a short time  period.  Again, this is a 75 year old Caucasian female voluntarily admitted.  Discharged on February 05, 2004.  Reported increase in auditory  hallucinations which began just prior to discharge from the last admission.  However, in description described more consistent symptoms with tinnitus,  which were noises that have gradually increased over a period of time and  not clear voices.  The patient felt that these sounds meant that she was  losing her mind and, at times, felt that they were noises of a group of  people talking.  Sister had moved away.  Husband died in 05/30/2000 and father had  died.  The patient was having to cope with being without support and appears  to have been somewhat dependent most of her life.  The patient had missed  IOP secondary to transportation issues as per patient.  Therefore,  noncompliant with aftercare plan.  Has two sons with a history of alcohol  and drug abuse with one son, who was her only support system after the first  discharge from [facility].   MEDICATIONS:  Aspirin, Lexapro, Combivent inhaler, hydrochlorothiazide,  Vicoprofen, Protonix.   ALLERGIES:  No known drug allergies except for fainting on ZOLOFT.   PHYSICAL EXAMINATION:  Essentially within normal limits.  Neurologically  nonfocal.   LABORATORY DATA:  Routine admission labs within normal limits.   MENTAL STATUS EXAM:  Fully alert, anxious.  Affect pleasant.  Cooperative.  Speech within normal limits.   Mood anxious, depressed when discussing how  she was doing but, at other times, observed on the unit, she appeared to be  quite social and comfortable, at least in the hospital.  Thought processes  goal directed.  Passive suicidal ideation.  No plan.  No active intent.  No  history of suicide attempts.  The patient reported auditory hallucinations,  again, feeling that maybe this was a crowd of people, not accepting that  this may be tinnitus and, instead, felt that this meant that she was going  crazy.  Cognitively intact.  Judgment and insight fair to poor.   ADMISSION DIAGNOSES:   AXIS I:  Major depressive disorder, recurrent, moderate to severe with  possible psychotic features.   AXIS II:  Dependent and histrionic traits.   AXIS III:  1. Gastroesophageal reflux disease.  2. Hypertension.  3. Chronic lower back pain.  4. Chronic obstructive  pulmonary disease.   AXIS IV:  Moderate to severe (limited support system, somewhat isolated,  some family losses and poor coping skills to live on own).   AXIS V:  35/60.   HOSPITAL COURSE:  The patient was admitted and ordered routine p.r.n.  medications and underwent further monitoring.  Was encouraged to participate  in individual, group and milieu therapy.  The patient was restarted on  Celexa, Ativan, Risperdal, Neurontin and CT of the brain, in August, was  negative.  The patient was placed on 15-minute safety checks and initially  reported positive suicidal ideation and severe anxiety with anxiety being  the primary complaint in addition to the distress related to the noises that  she was hearing.  The patient had some interaction with sister on the phone  and was quite upset by this, feeling that sister completely lacked  understanding and any form of support regarding her condition.  She  described being agitated from voices, frequently asked for p.r.n.  medications as well as frequent pain medications.  However, did report a   positive response to medication changes.  Risperdal was changed to Zyprexa  and Celexa optimized.  The patient tolerated these changes, reported a  decrease in noises as well as improvement in mood and decrease in anxiety.  The patient tolerated medications at this time without significant side  effects.   CONDITION ON DISCHARGE:  Markedly improved.   FOLLOW UP:  The patient was discharged to follow up with [facility], having mobilized to provide a support system and transportation and  socialization for patient.  The patient was given medication education.  The  patient was to start Monday in IOP and transportation was arranged.  She was  to follow up with Dr. Ladona Ridgel on March 14, 2004 at 1:15 p.m. and start  intensive outpatient.  She was also set up with Dr. Jearld Fenton at [facility]  on April 03, 2004 at 1 p.m.   DISCHARGE MEDICATIONS:  1. Vicoprofen 7.5\200 mg, 2 q.12h. p.r.n.  2. OxyContin 10 mg b.i.d. to t.i.d. p.r.n.  3. Zyprexa 12.5 mg q.h.s.  4. Celexa 20 mg b.i.d.  5. Aspirin 81 mg q.a.m.  6. Trazodone 100 mg q.h.s.  7. Ativan 1 mg, 1/2 t.i.d. and 1 q.h.s.   DISCHARGE DIAGNOSES:   AXIS I:  Major depressive disorder, recurrent, moderate to severe with  possible psychotic features.   AXIS II:  Dependent and histrionic traits.   AXIS III:  1. Gastroesophageal reflux disease.  2. Hypertension.  3. Chronic lower back pain.  4. Chronic obstructive pulmonary disease.   AXIS IV:  Moderate to severe (limited support system, somewhat isolated,  some family losses and poor coping skills to live on own).   AXIS V:  Global Assessment of Functioning on discharge 55-60.                                               Jeanice Lim, M.D.    Lovie Macadamia  D:  04/27/2004  T:  04/28/2004  Job:  [id number]

## 2011-05-15 NOTE — H&P (Signed)
NAME:  [name], [name]                           ACCOUNT NO.:  [id number]   MEDICAL RECORD NO.:  [id number]                   PATIENT TYPE:  IPS   LOCATION:  0300                                 [facility]ILITY[facility]  [facility]   PHYSICIAN:  Jeanice Lim, M.D.              DATE OF BIRTH:  1936/10/11   DATE OF ADMISSION:  03/15/2004  DATE OF DISCHARGE:                         PSYCHIATRIC ADMISSION ASSESSMENT   IDENTIFYING INFORMATION:  This is a 75 year old single white female who is  voluntarily admitted at this time.  She has been here several times  recently.  She actually has been attending the IOP the last two weeks and,  yesterday, felt that her depression had escalated and she was starting to  hear voices again.  She realized that she has no incentive to live, she just  wanted God to take her and she presented to the emergency room.  She states  that her family will not have anything to do with her.  Her 49 year old son  has been spending the night at her house to help her feel safe.  This  summer, she will be in this residence about two years.  Apparently, she sold  all of her things after her husband died with a plan to move in to a  permanent townhouse situation with her sister.  Six months into this  arrangement, her sister's daughter had some difficulties and her [place]  [place], leaving the patient totally responsible for the  mortgage and bills.  She states that her income is only $1050.00 a month.  The mortgage and utilities comes to $800-900 a month.  She can barely eat.   PAST PSYCHIATRIC HISTORY:  As already mentioned, she has had several recent  inpatient stays as well as attending IOP the past two weeks.  This is  problematic due to transportation issues but she enjoys the IOP and feels  that she has benefited from it.   SOCIAL HISTORY:  She is currently widowed.  She is living alone.  She does  have her son coming to visit her and her financial information has  already  been addressed.   FAMILY HISTORY:  She denies.   ALCOHOL/DRUG HISTORY:  She continues to smoke one pack of cigarettes per  day.   PRIMARY CARE PHYSICIAN:  Dr. Windle Guard in [place].   MEDICAL PROBLEMS:  Emphysema and back pain.   MEDICATIONS:  She is currently treated with Vicoprofen 7.5/200 mg, 2 in the  morning, 1-1/2 at 1 p.m., 6 p.m. and 2 q.h.s. every day (for her back pain),  Nitrostat 0.4 mg sublingually every five minutes for chest pain, K-Dur 20  mEq q.a.m.  She states that Dr. Ladona Ridgel had recently adjusted her medications  and he had recently written her a prescription for Zyprexa 10 mg q.h.s.  She  was also taking Neurontin 100 mg, 1 in the morning, 1 at  3 p.m. and 2  q.h.s., Celexa 10 mg q.a.m., aspirin 81 mg p.o. q.a.m., Colace 100 mg  b.i.d., Combivent inhaler 2 puffs t.i.d.  She was discharged on Ativan 0.5  mg, 1/2 t.i.d. and 1 mg q.h.s.  She would like [place] to replace it with Valium  and I wrote an order for 2.5 mg b.i.d., 5 mg q.h.s.  She was given Risperdal  last night and I will leave it to the weekly staff to see if they want to  switch her back to Zyprexa or not.   ALLERGIES:  No known drug allergies.   PHYSICAL EXAMINATION:  This is a well-developed, well-nourished, older white  female with no acute findings.  She does have a barrel chest with decreased  breath sounds consistent with her emphysema.   MENTAL STATUS EXAM:  She is alert and oriented x 3.  She is well-groomed.  She is casually dressed.  Her speech is not pressured.  Her mood is labile.  She cries.  Her affect shows a range.  Concentration and memory are intact.  Although she does get distracted, she can be easily redirected.  Judgment  and insight are fair.  Her intelligence is average.  She does not have  paranoia.  She may be somewhat delusional and she is endorsing hearing  voices.  They are not command in nature.   DIAGNOSES:   AXIS I:  Major depressive disorder,  recurrent, severe with psychotic  features, specifically auditory hallucinations.   AXIS II:  None.   AXIS III:  1. Emphysema.  2. Hypertension.   AXIS IV:  Severe (problems with primary support group, economic problems).   AXIS V:  35.   PLAN:  The patient will be admitted for safety.  We will adjust medications  as indicated.  We will try to maintain her participation in the IOP.     Vic Ripper, P.A.-C.               Jeanice Lim, M.D.    MD/MEDQ  D:  03/16/2004  T:  03/16/2004  Job:  [id number]

## 2011-05-15 NOTE — Consult Note (Signed)
[name], [name]                 ACCOUNT NO.:  [id number]   MEDICAL RECORD NO.:  [id number]          PATIENT TYPE:  OUT   LOCATION:  XRAY                         FACILITY:  [facility]   PHYSICIAN:  Sanjeev K. Deveshwar, M.D.DATE OF BIRTH:  01-12-1936   DATE OF CONSULTATION:  DATE OF DISCHARGE:                                   CONSULTATION   CHIEF COMPLAINT:  Back pain.   HISTORY OF PRESENT ILLNESS:  This is a 75 year old female with a long  history of back pain.  She had an L3 kyphoplasty performed by Dr. Corliss Skains  on February 20, 2005, secondary to a compression fracture.  She returned in  March of 2006 for a vertebroplasty at that same level.  She has done fine  since then until approximately three days ago when she was cleaning her  bathroom on her hands and knees.  She developed back pain afterwards.  She  states the pain is a 10 on a 1-10 scale.  She has not had any pain  medication.  She is here today for further evaluation.   PAST MEDICAL HISTORY:  1.  Significant for COPD.  She is on home oxygen therapy.  2.  She has gastroesophageal reflux disease.  3.  She had the above noted kyphoplasty and vertebroplasty.  4.  She has chronic back pain.  5.  She had a Cardiolite performed in June of 2002 that was negative for      ischemia with an ejection fraction of 65%.  6.  She has a history of anxiety and depression.   PAST SURGICAL HISTORY:  1.  Significant for hysterectomy.  2.  Hemorrhoidectomy.   ALLERGIES:  No known drug allergies.   MEDICATIONS:  1.  The patient has stated she is on two blood pressure medications.  She      believes they are atenolol and hydrochlorothiazide; however, she did not      bring her medications with her.  2.  She is also on Paxil.   SOCIAL HISTORY:  The patient is divorced.  She has two children.  She lives  in [place].  Her son lives with her but he was recently admitted to [facility] with a serious stroke.  The patient has a  history of tobacco  use.  She apparently smoked 1-1/2 packs of cigarettes per day for at least  16 years.  She does not use alcohol.  She is retired.   FAMILY HISTORY:  Her mother died at age 25 from a brain tumor.  Her father  died in his 62s from COPD.   IMPRESSION/PLAN:  As noted the patient presents today for evaluation of back  pain.  She had previously undergone kyphoplasty and vertebroplasty  procedures at the L3 level earlier this  year.  She had been pain free until  she cleaned her bathroom on her hands and knees several days ago.  She now  has 10/10 back pain.  She believes she has another compression fracture.   Dr. Corliss Skains evaluated the patient.  He believes that she probably has  a  new compression fracture as well, although the pain is in just about the  same area as her previous fracture.  Dr. Corliss Skains recommended an MRI for  further evaluation and we will schedule her for a repeat procedure in the  near future, probably later this week.   The patient also requested a prescription for pain medication.  She has not  had any pain medication and she is in severe pain.  We gave her a  prescription for Percocet 5/325 one q.6 h. p.r.n.. #30, no refills.  We also  had the nurse's station give her one Percocet here today as she is planning  on visiting her son after this visit and she will not be able to get the  prescription filled until she leaves the hospital.  She is being driven  today by her sister.   Greater than 30 minutes was spent on this consult.      Delton See, P.A.    ______________________________  Grandville Silos. Corliss Skains, M.D.    DR/MEDQ  D:  01/18/2006  T:  01/18/2006  Job:  [id number]   cc:   Vikki Ports, M.D.  Fax: [phone]   Oley Balm. Sung Amabile, M.D. [facility]  [address]  [place]  [place] [address]

## 2011-05-15 NOTE — Cardiovascular Report (Signed)
NAME:  NORENA, [name] NO.:  [id number]   MEDICAL RECORD NO.:  [id number]                   PATIENT TYPE:  OUT   LOCATION:  CATH                                 FACILITY:  [facility]   PHYSICIAN:  W. Ashley Royalty., M.D.         DATE OF BIRTH:  05/29/1936   DATE OF PROCEDURE:  DATE OF DISCHARGE:                              CARDIAC CATHETERIZATION   HISTORY:  This is a 75 year old who was admitted with severe chest pain, had  a myocardial infarction ruled out, continued to have severe chest pain, had  a negative Cardiolite several years ago.  The study is done to evaluate for  significant coronary artery disease.   COMMENTS ABOUT PROCEDURE:  The patient was brought by ambulance from [facility].  She tolerated the catheterization procedure well and had  good hemostasis and peripheral pulses noted at the end of the procedure.   HEMODYNAMIC DATA:  Aorta postcontrast 130/66, LV postcontrast 130/11-17.   ANGIOGRAPHIC DATA:  Left ventriculogram:  Performed in the 30-degree RAO  projection.  The aortic valve was normal.  The mitral valve was normal.  The  left ventricle appears normal in size.  Estimated ejection fraction is 60%.  Coronary arteries arise and distribute normally.  There is calcification  noted in the proximal left anterior descending coronary artery as well as  the circumflex.  The left main coronary artery is normal.  The left anterior  descending is calcified proximally.  There is mild to moderate diffuse  irregularity in the proximal portion of the vessel, but no severe focal  obstructive stenoses are noted.  The circumflex coronary artery has 2  marginal branches which are moderately tortuous but contain only mild  irregularities.  The right coronary artery is a dominant vessel with  moderate distal tortuosity.  It contains mild irregularities.  There are no  severe focal obstructive stenoses noted.  A comment is that the patient  was  complaining of significant chest pain before, during, and after the  procedure.  It was unaffected by the coronary injections.   IMPRESSION:  1. Normal left ventricular function.  2. Mild to moderate coronary artery disease with no severe focal obstructive     stenoses noted, and no stenoses noted to explain her level of pain.   RECOMMENDATIONS:  Evaluate for other causes of chest pain.  She will need  treatment for hyperlipidemia because of the calcification and needs to stop  smoking.                                               Darden Palmer., M.D.    WST/MEDQ  D:  01/15/2004  T:  01/16/2004  Job:  [id number]   cc:   Windle Guard, M.D.  [phone] [place]  [address]  [place], [place] [address]  Fax: [phone]   Elliot Cousin, M.D.

## 2011-05-15 NOTE — Consult Note (Signed)
NAME:  [name], [name]                 ACCOUNT NO.:  [id number]   MEDICAL RECORD NO.:  [id number]          PATIENT TYPE:  OUT   LOCATION:  MRI                          FACILITY:  [facility]   PHYSICIAN:  Sanjeev K. Deveshwar, M.D.DATE OF BIRTH:  May 02, 1936   DATE OF CONSULTATION:  01/19/2006  DATE OF DISCHARGE:                                   CONSULTATION   ADDENDUM:  This is an addendum. Please refer to complete dictated consult on  January 18, 2006. Ms. Kretsch returned today to have her MRI performed. She  had the study in the morning. Dr. Corliss Skains reviewed the results and had the  patient return in the afternoon to discuss the findings. The patient was not  found to have any new significant fractures. Dr. Corliss Skains did not feel that  she was a candidate for further interventions at this time as there was no  obvious fracture. He felt that there could possibly be microfractures  accounting for her pain, although he did not feel comfortable intervening  without definite evidence of a new compression fracture. He asked the  patient to wait another two or three weeks to see if her pain might improve  on its own. She is to call us if she continues to have pain after the next  several weeks.   INDICATIONS:      Delton See, P.A.    ______________________________  Grandville Silos. Corliss Skains, M.D.    DR/MEDQ  D:  01/19/2006  T:  01/19/2006  Job:  [id number]   cc:   Vikki Ports, M.D.  Fax: [phone]

## 2011-05-15 NOTE — H&P (Signed)
[name], [name] NO.:  [id number]   MEDICAL RECORD NO.:  [id number]          PATIENT TYPE:  INP   LOCATION:  0357                         FACILITY:  [facility]   PHYSICIAN:  Jackie Plum, M.D.DATE OF BIRTH:  [birth year]   DATE OF ADMISSION:  02/16/2005  DATE OF DISCHARGE:                                HISTORY & PHYSICAL   CHIEF COMPLAINT:  Shortness of breath.   HISTORY OF PRESENT ILLNESS:  The patient is a 75 year old Caucasian lady  with history of COPD/emphysema who continues to smoke cigarettes. She  presents to her PCP today and was referred for admission on account of  hypoxemia on oxygen. According to the patient, she has been having some  increased cough with change in her sputum color over the last few days, and  she has had some increased shortness of breath on oxygen without any relief.  She therefore went to see her PCP who asked the patient to be directly  admitted on account of very low O2s in the mid 92s. The patient admits to a  mild low-grade fever. No chest pain, but her chest feels congested. Does not  have any rhinorrhea. She denies any hematemesis or hemoptysis, abdominal  pain. No nausea or vomiting but feels quite weak. No lower extremity  swelling or ankle pain. The patient had an x-ray by her PCP which showed  increased pulmonary markings and then was referred to the ED for direct  admission.   PAST MEDICAL HISTORY:  Reveals history of COPD and emphysema. She has  history of chronic back pain.   CURRENT MEDICINES:  Includes:  1.  Vicodin p.r.n. for pain.  2.  Albuterol metered dose inhaler four times daily.  3.  She is on home oxygen at 2 L per minute by nasal cannula.   She does not have any medication allergies.   SOCIAL HISTORY:  The patient continues to smoke a half of a pack of  cigarettes daily. She has been smoking for more than 16 years. Does not  drink alcohol.   REVIEW OF SYSTEMS:  Significant positive and  negatives as in HPI; otherwise  unremarkable.   PHYSICAL EXAMINATION:  VITAL SIGNS:  Blood pressure 102/58, pulse 57 (pulse  was 103 on presentation), respiratory rate of 26. Respirations had come down  to 18 by time of my evaluation after receiving nebulizer treatment in the  ED. O2 saturation of 97% on 3 L of oxygen by nasal cannula.  GENERAL:  She was in mild respiratory distress.  HEENT:  Normocephalic. Pupils were equal, round, reactive to light.  Oropharynx was moist, no exudate or erythema.  NECK:  Supple, no JVD.  LUNGS:  She has decreased breath sounds with diffuse expiratory wheezes.  CARDIAC:  Regular rate and rhythm, no gallops or murmur.  ABDOMEN:  Soft, nontender.  EXTREMITIES:  No cyanosis, no edema.  NEUROLOGIC:  Oriented, no deficit.   LABORATORY DATA:  X-ray from the office indicated increased markings without  any obvious acute infiltrate. She had blood drawn in the ED:  WBC of 7.0,  hemoglobin  of 14.0, hematocrit 32.0, platelet count 393. Sodium 137,  potassium 3.8, chloride 102, CO2 29, glucose 114, BUN 11, creatinine 0.7.  The rest of her complete metabolic panel was unremarkable. Cardiac enzymes:  CK total was 28, MB was 1.0, troponin I 0.01.   IMPRESSION:  Acute chronic obstructive pulmonary disease exacerbation, the  exacerbating factor being acute bronchitis. The patient will be admitted to  telemetry bed for nebulizations, oxygen, steroids, and antibiotic.      GO/MEDQ  D:  02/16/2005  T:  02/16/2005  Job:  [id number]   cc:   Vikki Ports, M.D.  [address] [place] [place]  [place]  [place] [address]  Fax: [phone]

## 2011-05-15 NOTE — Discharge Summary (Signed)
[facility]  Patient:    [name], [name] Visit Number: [id number] MRN: [id number]          Service Type: MED Location: 2000 2021 01 Attending Physician:  Robynn Pane Dictated by:   Eduardo Osier Sharyn Lull, M.D. Admit Date:  07/08/2002 Discharge Date: 07/10/2002   CC:         [facility]   Discharge Summary  ADMISSION DIAGNOSES: 1. New onset angina, rule out myocardial infarction. 2. Tobacco abuse. 3. Questionable history of herniated disc. 4. Degenerative joint disease.  DISCHARGE DIAGNOSES: 1. Stable angina. 2. Hypercholesterolemia. 3. Degenerative joint disease. 4. Gastroesophageal reflux disease. 5. Tobacco abuse.  DISCHARGE MEDICATIONS: 1. Toprol XL 25 mg one tablet daily. 2. Baby aspirin 81 mg one tablet daily. 3. Celebrex 200 mg one capsule daily with food as needed. 4. Lipitor 10 mg one tablet daily. 5. Protonix 40 mg one tablet daily.  ACTIVITY:  As tolerated.  DIET: Low salt, low cholesterol.  FOLLOW-UP:  The patient will follow up with me in two weeks.  Follow-up with primary M.D. as scheduled.  CONDITION ON DISCHARGE:  Stable.  BRIEF HISTORY:  The patient is a 75 year old white female with past medical history significant for herniated disc, tobacco abuse.  She woke up with severe retrosternal chest pain, described as sharp, radiating to the left arm and left elbow associated with nausea and diaphoresis which lasted a few seconds to a minute.  She states she also had mild shortness of breath.  She denies any palpitations, lightheadedness or syncope.  She went to [facility] and was referred to [facility] ER.  The patient received one sublingual nitroglycerin with relief of chest pain from 8/10 to 0.  Presently, the patient denies any chest pain, nausea, vomiting, or diaphoresis. She denies any cardiac work-up in the past.  She denies history of exertional chest pain but  complains of exertional dyspnea.  She denies any fever, chills, cough, generalized PND, orthopnea or leg swelling.  PAST MEDICAL HISTORY:  Questionable history of herniated disc, history of meningitis in 1992.  PAST SURGICAL HISTORY:  She had hysterectomy in 1970s, status post hemorrhoidectomy many years ago.  SOCIAL HISTORY:  She is divorced. She has two children.  She smokes one pack per day for 20+ years.  No history of alcohol abuse.  She is retired.  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 [place]  FAMILY HISTORY:  Father died of abdominal aortic aneurysm rupture.  He also had emphysema at the age of 45.  Mother died of brain tumor. She had three sisters who had strokes.  PHYSICAL EXAMINATION:  GENERAL APPEARANCE:  She was awake, drowsy, and in no acute distress.  VITAL SIGNS:  Blood pressure 130/73, pulse 90, T-max 98.7.  HEENT:  Conjunctiva pink.  NECK:  Supple, no JVD, no bruit.  LUNGS:  There was occasional rhonchi.  Good air entry bilaterally.  CARDIOVASCULAR:  S1 and S2 was normal. There was no S3 gallop.  ABDOMEN:  Soft, bowel sounds present, nontender, no mass, no organomegaly.  EXTREMITIES:  There is no clubbing, cyanosis, or edema.  EKG done at [facility] showed normal sinus rhythm with poor R-wave progression and nonspecific T-wave changes.  Repeat EKG done in [facility] showed normal sinus rhythm with no acute ischemic changes.  LABORATORY:  Chest x-ray showed mild basilar atelectasis.  CRP was elevated which was 5.43. Cholesterol 218, LDL  135, HDL 52, triglyceride 153.  Two sets of CPK, MB and troponin I were negative. CK was 56, MB was 1.3.  Second set showed CK 55, MB 1.3.  Troponin I was 0.01 and 0.01.  Liver enzymes were normal.  Sodium was 145, potassium 3.5, chloride 105, bicarb 31, glucose 97, BUN 7, creatinine 0.9.  Hemoglobin 14.5, hematocrit 42.8.  HOSPITAL COURSE:  The patient was  admitted to telemetry unit.  MI was ruled out by serial enzymes and EKG.  The patient was started on aspirin, beta-blockers and subcu Lovenox.  The patient did not have any episodes of chest pain but complained of nausea and had one episode of vomiting.  The patient underwent Persantine Cardiolite on July 09, 2002, which showed no evidence of reversible ischemia with EF of 65%.  The patient, last night, tripped on the floor.  No apparent injuries were noted.  X-ray of the shoulder was obtained but it showed no evidence of fracture or dislocation.  The patient continues to have mild pain in the shoulder. There is no obvious shoulder injury.  Range of motion is normal. There is no evidence of swelling and x-ray was negative.  The patient will be discharged home on the above medications and will be followed up in my office in two weeks and her family medical doctor as scheduled. Dictated by:   Eduardo Osier Sharyn Lull, M.D. Attending Physician:  Robynn Pane DD:  07/10/02 TD:  07/13/02 Job: [id number]

## 2011-05-15 NOTE — H&P (Signed)
NAME:  [name], [name]                           ACCOUNT NO.:  [id number]   MEDICAL RECORD NO.:  [id number]                   PATIENT TYPE:  EMS   LOCATION:  MAJO                                 FACILITY:  [facility]   PHYSICIAN:  Hettie Holstein, D.O.                 DATE OF BIRTH:  16-Sep-1936   DATE OF ADMISSION:  01/13/2004  DATE OF DISCHARGE:                                HISTORY & PHYSICAL   PRIMARY CARE PHYSICIAN:  The patient is unassigned. She does utilize  [employer], Windle Guard, M.D.   CHIEF COMPLAINT:  Chest pain.   HISTORY OF PRESENT ILLNESS:  This is a 75 year old Caucasian female without  a significant past medical history, only a longstanding and heavy tobacco  abuse and chronic low back pain. She presents today  with chest pain that  initially started on Friday that was mild. She described this as sharp chest  pain. It persisted through Saturday. She stated that she had some light  headedness at this time. Around  6 p.m. the pain got worse. She stated it  radiated to her left arm. She stated her left arm was heavy. She said today  her left arm feels quite heavy. She did not sleep all night. She stated the  pain was off and on and got worse. She decided to  come to the emergency  room, at which time she was  found with a normal EKG and normal first set of  cardiac enzymes.   In review of  her records, it was noted that she  did have an Adenosine  stress test in July 2003. She was seen by Dr. Sharyn Lull at that time and she  was discharged with a diagnosis of stable angina. In the emergency  department she was sent for a CT scan to rule out pulmonary embolism. There  was no evidence of PE on that scan, however, they did report to the ER  physician that there was hilar and mediastinal adenopathy  which could be a  neoplasm. This was reviewed with the radiologist on duty at 9 p.m., and he  stated that this looked more hypoplastic and recommended only follow  up CT  scanning within the next  3 to 6  months.   In any event her pain subsided. She was started on  nitroglycerin and  heparin by  the emergency department physician. Her first set of cardiac  enzymes was unremarkable. Her EKG was normal.   PAST MEDICAL HISTORY:  As above. The patient has a history of stable angina.  However, she has  no medications for this at home, as well as the  hypercholesteremia for which she takes no medications either. She has  chronic  low back pain with a history of a bulging disk  and  gastroesophageal reflux disease. She has a history of  tobacco abuse. The  patient  is status post  hysterectomy in the 1970s  as well as bilateral  oophorectomy and appendectomy.   MEDICATIONS:  Vicodin 5 tablets a day.   ALLERGIES:  No known drug allergies.   SOCIAL HISTORY:  The patient lives alone. She had been living with her  sister until  August, however,  she left for [place]. She is divorced and  has 2 children. She smokes about 1 pack of  cigarettes per day for  the past  25 years, however, she used to have a 2 to 2-1/2 pack a day habit. 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   FAMILY HISTORY:  Her mother died at 29 with a brain  tumor. Her father died  at 80. He had emphysema. She does have a strong family history of suicide.  She has 3 sisters, 1 sister had a CVA at age 25.   REVIEW OF SYSTEMS:  The patient reports recent  weight gain. She also  reports severe depression since her sister moved away. She denies any nausea  or vomiting or diarrhea  or dysuria. The patient denies  hematemesis,  hematochezia, melena or coffee ground emesis. She does not follow routine  screening tests such as colonoscopy or mammograms. She does state that she  checks her breasts for lumps and has not noticed any.   PHYSICAL EXAMINATION:  VITAL SIGNS:  Blood pressure 142/67, heart rate 75,  respiratory rate 20, temperature 97, 99% saturation on room  air.  GENERAL:  The patient is awake, alert, in no acute distress.  HEENT:  Normocephalic and atraumatic. Extraocular muscles intact. No pallor  or jaundiced.  NECK:  Supple, nontender, no palpable thyromegaly.  CHEST:  On auscultation revealed scattered wheezes and crackles in the  peripheral fields. Otherwise her  chest revealed some reproducible  tenderness  in the infraclavicular  region. This was reviewed on the CT scan  with the radiologist  and there was no  finding of soft tissue masses or  lesions on further  review.  EXTREMITIES:  Symmetrical pulses bilaterally. No evidence of clubbing,  cyanosis or edema. She does have a large osteophytic deformity of her radial  side of  her right wrist which  is nontender.   LABORATORY DATA:  EKG was normal sinus rhythm,  no evidence of ischemia. CT  scan revealed no PE, only chronic lung disease and lymphadenopathy was noted  and as described above. Recommendations were for repeat  CT scan in 3 to 6  months.   Sodium 138, potassium 3.6, BUN 9, creatinine 0.6. INR 1.0. CK-MB, troponin I  negative.   IMPRESSION AND PLAN:  1. Left-sided chest pain, atypical with a prior history of angina. At this     time we are going to start her on low-dose beta blocker, aspirin,     nitroglycerin paste. We will initiate 24 hours worth of Lovenox. I do not     suspect this is an acute  ischemic event. However, it may be a     progression of her underlying stable angina. Perhaps we will need     cardiology's input in the morning. She has requested Dr. Donnie Aho, although     she is seen Dr. Sharyn Lull in the past. Dr. Donnie Aho was her father's     cardiologist and  she does request him.  2. Chronic obstructive pulmonary disease, emphysema.  3. Tobacco dependent.  4. Severe depression.  5. Lymphadenopathy.   PLAN:  At this time she will be admitted as noted above. We will ask social services to assist with her as well for possible resources to assist in  her   situation.                                                Hettie Holstein, D.O.    ESS/MEDQ  D:  01/13/2004  T:  01/13/2004  Job:  [id number]

## 2011-05-15 NOTE — Discharge Summary (Signed)
[name], [name] NO.:  [id number]   MEDICAL RECORD NO.:  [id number]          PATIENT TYPE:  INP   LOCATION:  3703                         FACILITY:  [facility]   PHYSICIAN:  Adriana Charity, MD     DATE OF BIRTH:  06-Aug-1936   DATE OF ADMISSION:  05/21/2007  DATE OF DISCHARGE:  05/24/2007                               DISCHARGE SUMMARY   DISCHARGE DIAGNOSES:  1. Atypical chest pain - resolved.  2. 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  3. Chronic obstructive pulmonary disease.  4. Hypertension.  5. Depression.  6. Chronic pain with history of an L3 compression fracture status post      kyphoplasty.  7. Coronary artery disease with moderate diffuse disease without focal      stenosis seen by catheterization in January 2005.  8. Osteoporosis.  9. Gastroesophageal reflux disease.  10.History of total abdominal hysterectomy and bilateral salpingo-      oophorectomy.   DISCHARGE MEDICATIONS:  1. Atenolol 25 mg p.o. daily.  2. Hydrochlorothiazide 25 mg p.o. daily.  3. Cymbalta 30 mg p.o. daily.  4. Advair 250/50 one puff b.i.d.  5. Aspirin 81 mg p.o. daily.  6. Ambien 5 mg p.o. q.h.s. p.r.n. insomnia.  7. Albuterol 2.5 mg nebs every four hours p.r.n. shortness of breath.  8. Vicodin 500/10 one tab p.o. every six hours p.r.n. pain.   DISPOSITION AND FOLLOWUP:  At time of discharge, the patient was in good  condition.  Her chest pain had completely resolved and she felt to be at  her baseline.  She has been scheduled for an outpatient hospital follow  up with Dr. Windle Guard at [facility] on May 31, 2007 at 10 a.m.  At that time, the patient's chest pain and other  chronic medical problems should be re-addressed.   PROCEDURES PERFORMED:  1. Abdominal ultrasound:  This was performed on May 22, 2007 and      showed a hyperechoic focus within the IVC just proximal to the      right atrium.  This was  felt to reflect dystrophic calcifications      related to chronic thrombus.  2. CT angiogram of the chest:  This was performed on May 23, 2007 and      showed mild airway thickening suggesting bronchitis or reactive      airway disease.  Cardiomegaly without overt edema was also noted.      No embolus was identified.  Review of the film with Dr. Charlett Nose      of radiology showed that no IVC thrombus was present to correlate      with the findings on ultrasound.  3. Stress Myoview cardiac imaging:  This was performed on May 24, 2007      and showed no evidence of ischemia or infarct.  Ejection fraction      was estimated at 86%.   CONSULTATIONS:  Dr. Viann Fish, cardiology.   BRIEF ADMITTING HISTORY AND PHYSICAL:  [name] is a 75 year old  woman  with a history of coronary artery disease, hypertension, COPD,  gastroesophageal reflux disease, and depression, who came to the  emergency department after awakening early that morning with 10/10 sharp  left-sided chest pain.  She reported heaviness in her left arm, but  otherwise no radiation of the pain.  Of note, she has baseline dyspnea  requiring 2 liters of oxygen at home, but did not have any significant  worsening of her baseline respiratory status.  She has felt nauseated,  but denied having any fevers, chills, diarrhea, urinary symptoms, or  abdominal pain.  Up until awakening with her chest pain on the morning  of admission, the patient had felt well and has never had this sort of  discomfort in the past.   PHYSICAL EXAMINATION:  VITAL SIGNS:  Temperature 97.2, blood pressure  92/52, pulse 63, respirations 20, oxygen saturation 94% on room air.  GENERAL:  The patient is a well-developed, well-nourished woman in no  acute distress.  HEENT:  Pupils equal, round, and reactive to light and accommodation.  Extraocular eye movements are intact.  The patient has moist mucus  membranes and a clear oropharynx.  NECK:  Supple  without lymphadenopathy or thyromegaly.  RESPIRATORY:  The patient has fair air movement bilaterally.  Bilateral  expiratory wheezes are noted.  No crackles are appreciated.  CARDIOVASCULAR:  The patient has a regular rate and rhythm without  murmurs, rubs, or gallops.  No carotid bruits or JVD are appreciated.  ABDOMEN:  Normoactive bowel sounds are present.  The abdomen is soft,  nontender, and nondistended.  EXTREMITIES:  No clubbing, cyanosis, or edema are seen.  SKIN:  This is without rashes or lesions.  LUNGS:  No supraclavicular or cervical lymphadenopathy is noted.  NEURO:  Cranial nerves are grossly intact.  The remainder of the exam is  nonfocal.  PSYCH:  The patient is alert and oriented times 3.  She is appropriate  in mood and affect.   ADMISSION LABS:  White blood cell count 8.3, hemoglobin 13.4, hematocrit  39.3, platelets 330,000.  ANC 4.8, MCV 89.5.  Sodium 136, potassium 2.9,  chloride 97, bicarbonate 28, BUN 13, creatinine 0.7, glucose 103.  Total  bilirubin 0.5.  Alkaline phosphatase 55, AST 15, ALT 15.  Total protein  6.4, albumin 3.3, calcium 8.5, magnesium 2.2.  Point of care markers  within normal limits times 3.  TSH 1.3.  Urine drug screen positive for  opiates.  Urinalysis within normal limits except for moderate  leukocytes, few squamous cells, 3 to 6 white blood cells, and a few  bacteria.   EKG:  This revealed normal sinus rhythm with no acute abnormalities.   Chest x-ray:  No active cardiopulmonary disease was identified.   HOSPITAL COURSE:  1. Atypical chest pain:  Though the patient's chest pain was atypical      in nature, she was felt to have multiple coronary artery disease      risk factors, including hypertension, a positive family history of      cardiovascular disease, a long smoking history, and prolonged      menopause.  She also underwent coronary angiography in January of     2005, which had shown nonobstructive but diffuse  atherosclerotic      changes of her coronary arteries.  The patient had serial EKGs and      cardiac enzymes obtained, which were all within normal limits.      However, it was felt that further  evaluation was necessary and thus      Dr. Donnie Aho, whom the patient had seen in the past, was consulted.      He recommended a stress Myoview and abdominal ultrasound.  Both of      these studies were subsequently found to be within normal limits      with the exception of the dystrophic IVC calcifications noted      above.  It was felt that the patient's atypical chest pain was not      cardiac in nature.  Furthermore, no evidence of pulmonary embolus,      pneumonia, pneumothorax, aortic dissection, or esophageal rupture      was identified.  It is likely that her chest pain was incidental      and musculoskeletal in nature.  She is to continue using Vicodin as      previously prescribed.  Further follow up will be deferred to Dr.      Jeannetta Nap.  2. Dystrophic calcification of the IVC:  This was an incidental      finding noted on abdominal ultrasound.  However, because this can      be seen with chronic thrombus, a CT angiogram of the chest and      upper abdomen was performed.  No evidence of thrombosis was seen on      this or on prior CT scans.  After discussing the case with Dr.      Charlett Nose, it was felt that further imaging was not necessary as      this was likely an incidental finding of no clinical significance.  3. COPD:  The patient has a history of COPD requiring home oxygen.      However, the patient was able to breath comfortably throughout the      hospitalization on 2 liters of oxygen by nasal cannula.  She was      continued on Advair and p.r.n. albuterol and Atrovent nebulized      treatments.  She is to resume her prior medications after discharge      with further adjustments as necessary by Dr. Jeannetta Nap.  4. Hypertension:  On admission, the patient's blood pressure was       borderline low at 92/52.  At that time, her anti-hypertensives were      held.  However, with resolution of her chest pain and gentle      hydration, the patient's blood pressure returned to the normal      range.  Anti-hypertensive therapy was restarted and the patient was      normotensive at the time of discharge.  5. Chronic pain:  The patient has a history of chronic back pain      secondary to an L3 compression fracture.  She has been taking      Vicodin for this and was continued on this as needed.   DISCHARGE LABS:  White blood cell count 8.2, hemoglobin 13, hematocrit  39, platelets 281,000.  Sodium 141, potassium 4, chloride 102,  bicarbonate 30, BUN 16, creatinine 0.8, glucose 104, calcium 8.7.  Total  cholesterol 168, triglycerides 152, HDL 37, LDL 101.   DISCHARGE VITALS:  Temperature 96.7, blood pressure 130/72, pulse 84,  respirations 20, oxygen saturation 97% on room air.      Yvonne Kendall, M.D. Electronically Signed      Adriana Charity, MD  Electronically Signed    CE/MEDQ  D:  05/25/2007  T:  05/25/2007  Job:  [id number]   cc:   Windle Guard, M.D.  Georga Hacking, M.D.

## 2011-05-15 NOTE — Consult Note (Signed)
NAME:  [name], [name]                           ACCOUNT NO.:  [id number]   MEDICAL RECORD NO.:  [id number]                   PATIENT TYPE:  INP   LOCATION:  0454                                 FACILITY:  [facility]   PHYSICIAN:  Darden Palmer., M.D.         DATE OF BIRTH:  06-11-1936   DATE OF CONSULTATION:  01/14/2004  DATE OF DISCHARGE:                                   CONSULTATION   IDENTIFYING DATA AND CHIEF COMPLAINT:  Thank you for asking me to see this  75 year old female for evaluation of chest discomfort and arm pain.   HISTORY OF PRESENT ILLNESS:  The patient previously had been in good health,  except for a history of severe tobacco abuse and chronic low-back pain.  She  had a three-day history of chest discomfort starting as mild and described  as a sharp shooting pain.  It persisted through Saturday.  She had some  lightheaded with it.  She became some nauseous with the pain and then the  pain radiated into her left midarm, and it was so painful that her arm felt  so heavy that she could not move it.  The arm pain persisted and she did not  sleep all night, and the pain waxed and waned through the evening.   The patient presented to the emergency room where she was found to have a  normal EKG and an initial set cardiac enzymes that were normal.   The patient was admitted to the hospital in July 2003 and had a Persantine  Cardiolite test at that time that was interpreted as normal.  She did not  follow up with Dr. Sharyn Lull at that time.  She has continued to complain of  chest pain since admission, which is described as a sharp pain and also  heavy pain involving her left arm.  It is not made worse with exertion, it  is not pleuritic and it is not made worse with food.  She evidently has a CT  scan of the chest that showed no evidence of dissection of pulmonary  embolus, and there was a question of whether she had some mild hilar  adenopathy.  Her pain subsided  nitroglycerin and heparin, and all serial  enzymes have been normal.  She, however, has had recurrence of pain without  EKG changes since then and I was asked to see her.   PAST HISTORY:  The past history is negative for hypertension.  There is some  mention on th chart of stable angina.  She has had no medications at home.  She has no history of diabetes and she denies any gastrointestinal problems.   PAST SURGICAL HISTORY:  Previous surgery include:  1. Hysterectomy in the 1970s.  2. Bilateral oophorectomy.  3. Appendectomy.   ALLERGIES:  None.   CURRENT MEDICATIONS:  Vicodin 5 mg daily.   FAMILY HISTORY:  Mother died at age 51 due  to a brain timor.  Father died at  age 32 with emphysema.  She has three sisters; one sister had a CVA at age  14.  No family history of premature cardiac disease.  Her father was a  patient of mine and did have some cardiac disease.  There is a strong family  history of suicide and depression.   SOCIAL HISTORY:  The patient has been married twice and has been divorced  twice.  She has two children, daughters; one lives locally and one lives in  [place].  She smoked heavily and continues to smoke about a pack-and-a-  half of cigarettes per day.  She currently lives alone.  She is a former  Theatre manager.   REVIEW OF SYSTEMS:  The patient's weight has been stable.  She has  significant depression as well as anxiety and does not to live alone.  She  has had significant chronic sleep disturbance.  She denies any eye, ear,  nose or throat complaints.  She does have some mild dyspnea, and some mild  cough and wheezing.  She denies an hematemesis,  hematochezia and melena.  She does not have any urinary symptoms.  She does have some low bulging disk  with chronic back pain and does not have claudication.  Other than as noted  above the remainder of the review of systems is unremarkable.   PHYSICAL EXAMINATION:  GENERAL APPEARANCE:  On  examination she is a pleasant  elderly woman in no acute distress.  She is mildly depressed.  VITAL SIGNS:  The patient's blood pressure is 140/80 and pulse is 70.  SKIN:  Skin is warm and dry.  HEENT:  ENT - EOMI, PERRLA and CNS clear.  Fundi not examined.  Pharynx is  negative.  NECK:  Neck is supple without masses, JVD, thyromegaly or bruits.  LYMPHATICS:  Lymph nodes are normal.  LUNGS:  Lungs are clear to A&P.  HEART:  Cardiac exam shows normal S1 and S2.  There is no S3, S4 or murmur.  ABDOMEN:  Abdomen is soft and nontender.  No masses, hepatosplenomegaly or  aneurysm palpated.  VASCULAR EXAMINATION:  Femoral pulses are 2+.  Dorsalis pedis and posterior  tibial pulses are 2+.   LABORATORY DATA:  Twelve-lead EKG is within normal limits.   Laboratory data shows a white count of 10,300, hemoglobin 13.4 and  hematocrit of 39.3.  Sodium is 134, potassium 3.2, chloride 106, CO2 26,  glucose 98, BUN 11, and creatinine 0.6.  All serial CPKs and troponins were  normal.  Cholesterol is 213 with triglycerides of 179, HDL cholesterol of 41  and LDL cholesterol of 136.   IMPRESSION:  1. Left-sided chest pain, which is atypical, with arm pain, which is also     atypical.  Many features of the pain are atypical for angina; however, she does have  risk factors of smoking and hyperlipidemia, and the patient has been ongoing  treatment.   1. Chronic obstructive pulmonary disease with cigarette abuse.  2. Significant depression.  3. Abnormal computerized tomographic scan.  4. Hyperlipidemia.   RECOMMENDATIONS:  At the present time she does have confounding problems  with depression and anxiety, but has severe ongoing chest discomfort.  There  are no EKG changes.  A Cardiolite scan within the past two years was  unremarkable.   I think that the best way to evaluate her is going to be with a cardiac catheterization particularly in light of confounding  depression and anxiety,  and her  focus on her chest pain since it has not bee relieved with  traditional measures.  I would recommend proceeding with catheterization  tomorrow.   The procedure was discussed with her fully including risks of myocardial  infarction, death, stroke, bleeding, arrhythmia, dye allergy, or renal  failure.  She understands and is willing to proceed.  I would check liver  enzymes.  We have advised her to stop smoking.   Thank you for asking me to see her with you.                                               Darden Palmer., M.D.    WST/MEDQ  D:  01/14/2004  T:  01/14/2004  Job:  [id number]   cc:   Windle Guard, M.D.  [address]  [place], [place] [address]  Fax: [phone]   Elliot Cousin, M.D.

## 2011-05-15 NOTE — Discharge Summary (Signed)
NAMEZAHRA, PEFFLEY NO.:  [id number]   MEDICAL RECORD NO.:  [id number]          PATIENT TYPE:  INP   LOCATION:  0357                         FACILITY:  [facility]   PHYSICIAN:  Sherin Quarry, MD      DATE OF BIRTH:  05-May-1936   DATE OF ADMISSION:  02/16/2005  DATE OF DISCHARGE:  02/23/2005                                 DISCHARGE SUMMARY   Emmaline Wahba is a 75 year old lady with severe COPD who continues to smoke  about 1/2 pack of cigarettes per day. She is maintained on home oxygen.  She  presented to [facility] Emergency Room on February 20 with increased cough,  productive of yellowish phlegm as well as progressive increase in shortness  of breath. Chest x-ray obtained in Dr. Aurelio Brash office showed evidence of  increased pulmonary infiltrates.   HOSPITAL COURSE:  Initial physical examination is performed by Dr. Cloyd Stagers-  Bonsu.  The patient blood pressure was 102/58, pulse 57, respirations 26.  O2 saturation was 97% on 3 L.  HEENT within normal limits.  Examination of  the chest revealed markedly decreased breath sounds diffusely with marked  expiratory wheezing.  Cardiovascular revealed normal S1 and S2 with no  murmur, rub, or gallops.  Abdomen was benign.  Neurologic testing and  examination of the extremities was normal.  CBC revealed a white count of  7000, hemoglobin 14. Sodium 137, potassium 3.8, creatinine 0.7, BUN 11.   On admission the patient was placed on oxygen and was given normal saline at  60 mL per hour. Nebulizer treatments were carried out with albuterol and  Atrovent on a q.4h. basis.  Solu-Medrol 60 mg IV every eight hours was  begun.  The patient's respiratory status improved substantially over the  next two days.  It became clear that her main problem seem to be severe low  back pain.  She indicated that she had had a longstanding problem with her  back which dated back at least a year.  However, she stated that about two  weeks prior to  this presentation, her back pain became much worse.  The pain  seemed to be localized to the lumbar portion of the back and did not  radiate.  Therefore, an MRI scan of the lumbar spine was carried out.  This  showed a compression fracture at L3 which appeared be acute with a loss of  height estimated to be about one-third and posterior bowing of posterior  margin of the vertebral body.  The appearance of this fracture was felt to  be consistent with a benign osteoporotic compression fracture.  As the  patient was having a lot of pain, I consulted Dr. Corliss Skains of the radiology  service and a L3 kyphoplasty was carried on February 24.  The patient  tolerated this procedure very well.  Since that time her back pain has  improved although it has not resolved.  Because the patient has limited  social support, it was felt best to keep her in the hospital the weekend and  discharge her on Monday.  Therefore,  on Monday, February 27, the patient was  discharged.   DISCHARGE DIAGNOSES:  1.  Chronic obstructive pulmonary disease with asthmatic bronchitis.  2.  Acute L3 compression fracture secondary to osteoporosis.  3.  Chronic back pain.  4.  Chronic cigarette smoking.   DISCHARGE MEDICATIONS:  1.  Combivent metered dose inhaler, three puffs q.i.d.  2.  Vicodin 10/325 one every four to six hours p.r.n. for pain.  3.  Avelox 400 mg which the patient will take for five additional days.  4.  Prednisone 40 mg p.o. for two days, 20 p.o. for three days, then      discontinue.  5.  The patient will continue home oxygen at 2 L by nasal prongs.   FOLLOW UP:  She will continue to have [facility] follow her in the  home on a regular schedule and physical therapy at home will be instituted.   The patient was very strongly urged to completely discontinue cigarette  smoking.  She will contact Dr. Aurelio Brash office for a followup appointment  on Monday.  It should be pointed that apparently she  has an appointment  which has been scheduled with Dr. Sung Amabile to evaluate her pulmonary status.  Therefore, I am sending a copy of this dictation to Dr. Sung Amabile' office.   CONDITION ON DISCHARGE:  Fair.      SY/MEDQ  D:  02/22/2005  T:  02/23/2005  Job:  [id number]   cc:   Vikki Ports, M.D.  [address] [place]  [place]  [place] [address]  Fax: [phone]   Oley Balm. Sung Amabile, M.D. [facility]   Sanjeev K. Corliss Skains, M.D.  [address]  [place]  [place] [address]  Fax: [phone]

## 2011-05-15 NOTE — Discharge Summary (Signed)
NAME:  [name], [name]                           ACCOUNT NO.:  [id number]   MEDICAL RECORD NO.:  [id number]                   PATIENT TYPE:  IPS   LOCATION:  0304                                 FACILITY:  [facility]   PHYSICIAN:  Jeanice Lim, M.D.              DATE OF BIRTH:  [birth year]   DATE OF ADMISSION:  01/25/2004  DATE OF DISCHARGE:  02/05/2004                                 DISCHARGE SUMMARY   IDENTIFYING DATA:  This is a 75 year old widowed Caucasian female  voluntarily admitted with a history of depression, having multiple losses.  Daughter-in-law suicided in 05-27-1998, father died in 53, husband died in May 27, 2000,  sister moved away.  The patient had a gradual increase in depression, much  worse over the last four weeks with poor concentration and crying spells,  feeling he was unable to calm himself down, anhedonia, and positive suicidal  ideation, feeling that she could not go on.  This was her first [facility] admission.   MEDICATIONS:  1. Neurontin.  2. Lexapro.  3. Protonix.  4. Aspirin.  5. Nitroglycerin.  6. Vicoprofen.   DRUG ALLERGIES:  ZOLOFT, causing fainting.   PHYSICAL EXAMINATION:  GENERAL:  Essentially within normal limits.  NEUROLOGIC:  Nonfocal.   LABORATORY DATA:  Routine admission labs:  Within normal limits.   MENTAL STATUS EXAM:  Fully alert.  Tearful affect.  Psychomotor slowing.  Some latency in responses.  Mood: Depressed, hopeless, and helpless.  Thought process: Positive thought agitation, positive possible thought  blocking, and passive suicidal ideation, possible auditory hallucinations as  well.  Cognitive: Intact.  Judgment and insight: Fair to poor.   ADMISSION DIAGNOSES:   AXIS I:  Major depressive disorder, recurrent, severe with psychotic  features.   AXIS II:  Deferred.   AXIS III:  1. Constipation.  2. Gastroesophageal reflux disease.  3. Chronic back pain.   AXIS IV:  Severe social isolation, problems related to  limited support  system.   AXIS V:  26/55   HOSPITAL COURSE:  The patient was admitted, ordered routine p.r.n.  medications, underwent further monitoring, and was encouraged to participate  in individual, group, and milieu therapy.  The patient was started on  Lexapro and Risperdal targeting severe depressive symptoms.  The patient  reported that she had not ever had to be alone but was having significant  difficulty now, feeling isolated.  Other stressors included and adult who  allegedly used drugs and 55 year old grandson was in custody of [employer].  The  patient felt guilty that she was unable to intervene and initially reported  hopelessness and helplessness.  The patient described significant anxiety,  feeling overwhelmed.  These complaints gradually improved as she was  stabilized on medications and received clinical intervention.  She tolerated  medication changes well, reported decrease in anxiety, improvement in mood,  and improvement in pain.  Anxiety was then her  primary complaint.  She was  able to contract for safety.  Collateral history was obtained and the  patient's report appeared to be mostly reliable.  The patient continued to  have suicidal thoughts periodically but becomes able to contract for safety.  She was seen by internal medicine regarding medical problems.  Medications  were held due to the patient being oversedated.  She slept over 24 hours or  close to 24 hours.  This was likely related to increase in Klonopin due to  the patient's report of severe stress and no response.  As she was resumed  on lower dose of medications, she reported a decrease in anxiety and  improvement in mood.  She was somewhat resistant to suggested coping  mechanisms as well as reaching out to be less isolated.  Care management did  an excellent job connecting the patient with senior services which could get  her to appointments and assist her in socialization, giving her external   support system.  The patient was quite anxious about being discharged,  reported increase in voices whenever talking about discharge.  However, at  the time of discharge, the patient's symptoms had markedly improved.  Her  mood was much less depressed, affect brighter.  She appeared much more calm.  She had no dangerous ideation and appeared to be able to cope with stressors  outside the hospital as long as she was compliant with the followup plan.   DISCHARGE MEDICATIONS:  1. Vicoprofen.  2. Cipro.  3. Ambien 5 mg q.h.s. p.r.n.  4. Nitrostat p.r.n. as previously directed chest pain.  5. K-Dur 20 mEq q.a.m.  6. Neurontin 100 mg b.i.d. and two q.h.s.  7. Ativan 0.5 mg one half t.i.d. and one q.h.s.  8. Celexa 10 mg q.a.m.  9. Risperdal 0.25 mg q.h.s.  10.      Protonix 40 mg q.a.m.  11.      Aspirin 81 mg q.a.m.  12.      Colace 100 mg b.i.d.  13.      Combivent inhaler two puffs t.i.d.  14.      Hydrochlorothiazide 25 mg q.a.m.   FOLLOW UP:  The patient was to follow up with [facility] on Wednesday, February 9 at 9 a.m.  The patient  reported to understand the importance of being complaint with this followup  and the fact that she had lack of coping skills and appeared to get  distressed quickly and then felt unable to manage being out of the hospital  all argue for the patient to be compliant with this intensive outpatient  followup, which she was willing to.  Transportation had been arranged for  her.  The patient was discharged in improved condition with no risk issues.   DISCHARGE DIAGNOSES:   AXIS I:  Major depressive disorder, recurrent, severe with psychotic  features.   AXIS II:  Deferred.   AXIS III:  1. Constipation.  2. Gastroesophageal reflux disease.  3. Chronic back pain.   AXIS IV:  Severe social isolation, problems related to limited support  system.  AXIS V:  Global assessment of functioning on discharge  was 55.                                               Jeanice Lim, M.D.  JEM/MEDQ  D:  04/27/2004  T:  04/28/2004  Job:  [id number]

## 2011-05-15 NOTE — Discharge Summary (Signed)
NAME:  [name], [name]                           ACCOUNT NO.:  [id number]   MEDICAL RECORD NO.:  [id number]                   PATIENT TYPE:  INP   LOCATION:  0352                                 FACILITY:  [facility]   PHYSICIAN:  Elliot Cousin, M.D.                 DATE OF BIRTH:  08/12/1936   DATE OF ADMISSION:  01/14/2004  DATE OF DISCHARGE:  01/17/2004                                 DISCHARGE SUMMARY   DISCHARGE DIAGNOSES:  1. Non-cardiac chest pain.     A. Cardiac catheterization per Dr. Donnie Aho on January 15, 2004.  The        results revealed an ejection fraction estimated at 60%, calcification        noted in the proximal left anterior descending artery as well as the        circumflex, the left main coronary artery is normal, the left anterior        descending is calcified proximally, mild diffuse irregularity in the        proximal portion of the vessel with no severe focal obstructive        stenosis, circumflex artery has 2 marginal branches with only mild        irregularities, right coronary artery is dominant with moderate distal        tortuosity, no severe focal obstructive stenosis noted.     B. 2D echocardiogram on January 14, 2004.  The results revealed an        estimated ejection fraction of 55-65%.  There was no left ventricular        regional wall motion abnormalities.  No significant valvular        abnormalities.     C. CT scan of the chest negative for pleural effusion.  2. Chronic obstructive pulmonary disease with ongoing tobacco abuse (CT scan     revealed emphysema).  3. Nonspecific bilateral hilar and mediastinal adenopathy.  4. Depression.   SECONDARY DISCHARGE DIAGNOSES:  1. Gastroesophageal reflux disease.  2. Chronic low back pain with a history of bulging disk.  3. Status post hysterectomy and bilateral oophorectomy in the past.  4. Status post appendectomy in the past.  5. History of mild hyperlipidemia.   DISCHARGE MEDICATIONS:  1. OxyContin  10 mg q.12 hrs.  2. Neurontin 100 mg q.8-12 hrs as needed.  3. Vicodin per Dr. Jeannetta Nap as previously ordered.  4. Xanax 0.25 mg b.i.d.  as needed.  5. Lexapro 10 mg daily.  6. Aspirin 81 mg daily.  7. Trazodone 50 mg at bedtime.\  8. Combivent MDI 2 puffs b.i.d. to q.i.d. as needed.   DISCHARGE DISPOSITION:  The patient was discharged to home in improved and  stable condition on January 17, 2004.  She has a follow-up appointment with  her primary care physician Dr. Jeannetta Nap on January 28 at 10 a.m.   CONSULTATIONS:  1. Karleen Hampshire  Donnie Aho, M.D.  2. Antonietta Breach, M.D.   HISTORY OF PRESENT ILLNESS:  The patient is a 75 year old lady without a  significant past ;medical history with exception of heavy tobacco abuse and  chronic low back pain.  She presented to the emergency department on January 13, 2004 with a 2-day history of sharp chest pain.  The chest pain  persisted, with intermittent relief throughout the 2 days.  It was  accompanied by lightheadedness occasionally.  The patient started radiating  to her left arm and her left arm began to feel heavy.  She decided to come  to the emergency room for evaluation.  The patient has a history of chest  pain and underwent an adenosine stress test in July of 2003 which was  negative.   HOSPITAL COURSE:  1. CHEST PAIN THOUGHT TO BE NON-CARDIAC.  The initial management included     oxygen therapy, nitro paste at 1 inch every 6 hours, Lovenox 1 mg/kg     subcutaneous q.12 hours, morphine IV as needed for pain, and aspirin     therapy.  For further evaluation cardiac enzymes were collected q.8 hours     x3 and an EKG was repeated the following morning.  The CK, CK MB and     Troponin I were all within normal limits x3 sets.  The EKG on admission     and the following morning revealed normal sinus rhythm with no     abnormalities.  A 2D echocardiogram was ordered for additional     evaluation.  The 2D echocardiogram was virtually unremarkable.   There was     no left ventricular wall motion abnormalities and the patient's ejection     fraction was 55-65%.  There was no significant valvular abnormalities.     The patient continued to complain of chest pain at rest and with minimal     activity.  It was relieved more with morphine than it was with the nitro     paste.  A CT scan of the chest was therefore ordered for evaluation of     PE.  The CT scan of the chest with contrast did not suggest a PE.  The CT     scan did reveal emphysema and changes which could be secondary to     pulmonary arterial hypertension.  The CT scan also revealed nonspecific     mediastinal and bi hilar adenopathy.  The patient was therefore started     on treatment with Combivent inhaler 2 puffs t.i.d. to q.i.d.Marland Kitchen  She was     also strongly advised to stop smoking.   Dr. Viann Fish was consulted for further cardiac evaluation given the  patient's risk factor, primarily cigarette smoking.  Dr. Donnie Aho performed a  cardiac catheterization on January 15, 2004.  The results of the  catheterization revealed mild to moderate coronary artery disease with no  severe focal obstructive stenosis noted and no stenosis noted to explain her  level of pain.  The patient was started on treatment with long-acting  OxyContin 10 mg q.12 hours and Neurontin 100 mg b.i.d. to t.i.d.  A  cervical/neck radiograph was ordered to evaluate for DJD as a possible cause  of her chest pain.  The cervical spine film was completely within normal  limits.  The patient's fasting lipid panel was assessed and was found to  have a total cholesterol of 213, triglycerides of 179, HDL cholesterol of  41, and LDL of  136.  The patient was not started on a statin medication,  however she may need statin therapy in the near future.  This decision will  be deferred to her primary care physician Dr. Jeannetta Nap.  Following the cardiac catheterization the patient's symptoms improved.  She  was  discharged on the medications for pain as stated above.  She also takes  p.r.n. Vicodin as prescribed by Dr. Jeannetta Nap.  1. DEPRESSION.  The patient had signs and symptoms consistent with     depression on history and physical.  The patient also had signs and     symptoms consistent with anxiety.  It was felt that the depression and     the anxiety were probable contributors to the patient's chest pain.  Dr.     Jeanie Sewer, psychiatrist, was consulted for evaluation and management.  He     started the patient on Lexapro at 5 mg daily and trazodone 50 mg q.h.s.     The Lexapro was titrated up to 10 mg daily prior to hospital discharge.     The patient was also treated by the hospitalist staff with Xanax 0.25 mg     t.i.d. p.r.n.  The patient demonstrated no suicidal ideation during the     hospital course.  She was advised to follow up with Behavioral Health in     1-2 weeks per Dr. Providence Crosby recommendation.  2. NONSPECIFIC BI HILAR ADENOPATHY AND MEDIASTINAL ADENOPATHY.  As stated     above, the CT scan revealed these incidental findings.  The radiologist     recommends that the CT scan of the chest be repeated in 3-6 months for     stabilization.  3. COPD/EMPHYSEMA PER CT SCAN OF THE CHEST.  ONGOING TOBACCO ABUSE.  The     patient has a long history of tobacco abuse.  She was advised to stop     smoking and to use the Combivent MDI 2-4 times daily as needed.                                               Elliot Cousin, M.D.    DF/MEDQ  D:  01/26/2004  T:  01/26/2004  Job:  [id number]   cc:   Windle Guard, M.D.  [address]  [place], [place] [address]  Fax: [phone]

## 2011-05-15 NOTE — H&P (Signed)
[name], [place] NO.:  [id number]   MEDICAL RECORD NO.:  [id number]          PATIENT TYPE:  INP   LOCATION:  0101                         [facility]ACILITY:  [facility]   PHYSICIAN:  Hollice Espy, M.D.DATE OF BIRTH:  [birth year]   DATE OF ADMISSION:  01/27/2005  DATE OF DISCHARGE:                                HISTORY & PHYSICAL   PRIMARY CARE PHYSICIAN:  Windle Guard, M.D.   CHIEF COMPLAINT:  Shortness of breath and weakness.   The patient is a 75 year old white female with past medical history of heavy  tobacco abuse and emphysema. She was diagnosed with emphysema a few years  ago; however, she tells me currently she is not on any medications at all;  no inhalers, oxygen, or otherwise. She smokes a pack and a half a day, and  she has been for the last 16 year. She came in. For the last two to three  weeks, she has been having increased dyspnea on exertion and productive  coughing spells of clearish to yellowish sputum. It has continued to worsen,  and then starting earlier on in the day, she started having problems with  some right sided chest wall pain located over the right breast. There was no  other symptoms such as arm radiation or radiation to the neck or jaw. No  dizziness or nausea or vomiting. The patient has felt severely weak all over  for the past few weeks, and she feels like it is getting worse. She came to  the emergency room and was noted to have an O2 saturation of 88% on room  air. She was started on 5 liters, and her saturations came up; however,  whenever she is talking and not breathing through her nose, her oxygen  saturations drop back down to about 88%. She was noted on lab work to have  an elevated white count of 15 with an 88% shift. Chest x-ray showed no  evidence of overt pneumonia or heart failure. BNP was normal. The patient  had a CT scan of her chest which showed some lymphadenopathy but was felt to  be more reactive than anything  else. She did not have any overt evidence of  a lung mass, pulmonary embolus, or pneumonia. Currently, the patient states  she is weak, hurting all over, and very tired. She continues to complain of  shortness of breath. Otherwise, she denies any other complaints such as  headaches or visual changes, dysphagia, palpitations, wheezing, abdominal  pain, hematuria, dysuria, constipation, or diarrhea. She denies any focal  extremity pain or weakness. She complains of hurting in her arms, legs, and  back and chest.   PAST MEDICAL HISTORY:  1.  Tobacco abuse.  2.  Emphysema.  3.  Chronic back pain.   MEDICATIONS:  None.   ALLERGIES:  None.   SOCIAL HISTORY:  She smokes a pack and a half a day for 16 years. She does  not drink or use any drugs.   FAMILY HISTORY:   PHYSICAL EXAMINATION:  VITAL SIGNS:  The patient's vitals on admission,  temperature  97, blood pressure 152/87, heart rate 134 coming down to about  116, O2 saturation is 88% on room air. Now, she is saturating about 95% on 5  liters. Respirations 28.  HEENT:  Normocephalic, atraumatic. Mucous membranes are dry. She has no  carotid bruits.  HEART:  Regular rate and rhythm. S1 and S2. She has decreased breath sounds  throughout with bilateral crackles and poor airway exchange.  ABDOMEN:  Soft, nontender, nondistended. Positive bowel sounds.  EXTREMITIES:  No clubbing, cyanosis, or edema. Trace pitting edema.   LABORATORY DATA:  White count of 15.6 with an 82% shift, H and H 15.2 and  45.4, MCV of 87, platelet count of 497, noted toxic granulation and large  platelets noted. Sodium 136, potassium 3.1, chloride 97, bicarb 28, BUN 12,  creatinine 0.8, glucose 130. LFTs are within normal limits. BNP is normal at  less than 30. D-dimer is normal at 0.36. CPK-MB is elevated at 442 and 5.9  with a normal troponin I of less than 0.05. Subsequent sets show a lessening  of CPK to 292 and then 250.   ASSESSMENT/PLAN:  1.  Chronic  obstructive pulmonary disease exacerbation. The patient is not      on any home medications and has a 24 pack-year smoking history ongoing.      By CT, no evidence of pneumonia or PE outright seen.  No reactive      lymphadenopathy and white blood count with shift. Will check an ABG.      Start O2 and antibiotics and nebulizers. Hold on steroids.  2.  Chronic back pain. Pain control.  3.  Hypokalemia. Replace.  4.  Elevated H and H likely secondary to polycythemia vera from heavy      smoking although clinically she does not really show this, but noted her      BUN and creatinine are normal, so I do not think that she is dehydrated,      and this is not fluid concentration.  5.  Elevated CPK-MB. Likely this is skeletal muscle breakdown from either      coughing heavily or minimal ambulation over the few days secondary to      weakness.      SKK/MEDQ  D:  01/27/2005  T:  01/27/2005  Job:  [id number]   cc:   Windle Guard, M.D.  [address]  [place], [place] [address]  Fax: [phone]

## 2011-09-23 LAB — BASIC METABOLIC PANEL
CO2: 27
Chloride: 94 — ABNORMAL LOW
GFR calc Af Amer: >60
GFR calc non Af Amer: >60
Glucose, Bld: 120 — ABNORMAL HIGH
Sodium: 136

## 2011-09-23 LAB — CBC
Hemoglobin: 15.5 — ABNORMAL HIGH
Platelets: 510 — ABNORMAL HIGH
WBC: 12.2 — ABNORMAL HIGH

## 2011-09-23 LAB — BUN: BUN: 16

## 2011-09-23 LAB — CREATININE, SERUM: GFR calc non Af Amer: 56 — ABNORMAL LOW

## 2011-09-23 LAB — PROTIME-INR: Prothrombin Time: 13.1

## 2011-11-02 ENCOUNTER — Institutional Professional Consult (permissible substitution): Payer: Self-pay | Admitting: Internal Medicine

## 2012-12-28 DIAGNOSIS — S3210XA Unspecified fracture of sacrum, initial encounter for closed fracture: Secondary | ICD-10-CM

## 2012-12-28 HISTORY — DX: Unspecified fracture of sacrum, initial encounter for closed fracture: S32.10XA

## 2013-05-01 ENCOUNTER — Emergency Department (HOSPITAL_COMMUNITY): Payer: Medicare Other

## 2013-05-01 ENCOUNTER — Encounter (HOSPITAL_COMMUNITY): Payer: Self-pay | Admitting: *Deleted

## 2013-05-01 ENCOUNTER — Emergency Department (HOSPITAL_COMMUNITY)
Admission: EM | Admit: 2013-05-01 | Discharge: 2013-05-01 | Disposition: A | Discharge status: Home / Self Care | Payer: Medicare Other | Attending: Emergency Medicine | Admitting: Emergency Medicine

## 2013-05-01 DIAGNOSIS — R05 Cough: Secondary | ICD-10-CM | POA: Insufficient documentation

## 2013-05-01 DIAGNOSIS — I10 Essential (primary) hypertension: Secondary | ICD-10-CM | POA: Insufficient documentation

## 2013-05-01 DIAGNOSIS — R059 Cough, unspecified: Secondary | ICD-10-CM | POA: Insufficient documentation

## 2013-05-01 DIAGNOSIS — M549 Dorsalgia, unspecified: Secondary | ICD-10-CM | POA: Insufficient documentation

## 2013-05-01 DIAGNOSIS — Z79899 Other long term (current) drug therapy: Secondary | ICD-10-CM | POA: Insufficient documentation

## 2013-05-01 DIAGNOSIS — J441 Chronic obstructive pulmonary disease with (acute) exacerbation: Secondary | ICD-10-CM | POA: Insufficient documentation

## 2013-05-01 DIAGNOSIS — Z8739 Personal history of other diseases of the musculoskeletal system and connective tissue: Secondary | ICD-10-CM | POA: Insufficient documentation

## 2013-05-01 DIAGNOSIS — Y9289 Other specified places as the place of occurrence of the external cause: Secondary | ICD-10-CM | POA: Insufficient documentation

## 2013-05-01 DIAGNOSIS — Y9389 Activity, other specified: Secondary | ICD-10-CM | POA: Insufficient documentation

## 2013-05-01 DIAGNOSIS — W1809XA Striking against other object with subsequent fall, initial encounter: Secondary | ICD-10-CM | POA: Insufficient documentation

## 2013-05-01 DIAGNOSIS — Z9981 Dependence on supplemental oxygen: Secondary | ICD-10-CM | POA: Insufficient documentation

## 2013-05-01 DIAGNOSIS — M25552 Pain in left hip: Secondary | ICD-10-CM

## 2013-05-01 DIAGNOSIS — G8929 Other chronic pain: Secondary | ICD-10-CM | POA: Insufficient documentation

## 2013-05-01 DIAGNOSIS — S79929A Unspecified injury of unspecified thigh, initial encounter: Secondary | ICD-10-CM | POA: Insufficient documentation

## 2013-05-01 DIAGNOSIS — S79919A Unspecified injury of unspecified hip, initial encounter: Secondary | ICD-10-CM | POA: Insufficient documentation

## 2013-05-01 HISTORY — DX: Other chronic pain: G89.29

## 2013-05-01 HISTORY — DX: Chronic obstructive pulmonary disease, unspecified: J44.9

## 2013-05-01 HISTORY — DX: Essential (primary) hypertension: I10

## 2013-05-01 HISTORY — DX: Dependence on supplemental oxygen: Z99.81

## 2013-05-01 HISTORY — DX: Dorsalgia, unspecified: M54.9

## 2013-05-01 HISTORY — DX: Age-related osteoporosis without current pathological fracture: M81.0

## 2013-05-01 MED ORDER — OXYCODONE-ACETAMINOPHEN 5-325 MG PO TABS: ORAL_TABLET | ORAL | Status: DC

## 2013-05-01 MED ORDER — HYDROCODONE-ACETAMINOPHEN 5-325 MG PO TABS
2.0000 | ORAL_TABLET | Freq: Once | ORAL | Status: AC
  Administered 2013-05-01: 2 via ORAL
  Filled 2013-05-01: qty 2

## 2013-05-01 MED ORDER — OXYCODONE-ACETAMINOPHEN 5-325 MG PO TABS
1.0000 | ORAL_TABLET | Freq: Once | ORAL | Status: AC
  Administered 2013-05-01: 1 via ORAL
  Filled 2013-05-01: qty 1

## 2013-05-01 MED ORDER — ACETAMINOPHEN 500 MG PO TABS: 500.0000 mg | ORAL_TABLET | Freq: Four times a day (QID) | ORAL | Status: DC | PRN

## 2013-05-01 NOTE — ED Provider Notes (Signed)
Medical screening examination/treatment/procedure(s) were conducted as a shared visit with non-physician practitioner(s) and myself.  I personally evaluated the patient during the encounter  Pt seen and evaluated. Pt with chronic low back pain and now hip pain/buttock pain.  No weakness of legs, no urinary retention or incontinence. No fever.  No midline tenderness of lumbar spine.  Strength full in lower extremities bilaterally.  Pt given pain control and advised outpatient followup.  Discharged with strict return precautions.  Pt agreeable with plan.  Ethelda Chick, MD 05/01/13 [phone]

## 2013-05-01 NOTE — ED Provider Notes (Signed)
History     CSN: [id number]  Arrival date & time 05/01/13  1356   First MD Initiated Contact with Patient 05/01/13 1429      Chief Complaint  Patient presents with  . Hip Pain    (Consider location/radiation/quality/duration/timing/severity/associated sxs/prior treatment) HPI Pt is a 77yo female with hx of chronic back pain, copd, and osteoporosis c/o left sided hip pain, sharp in nature that began 2wks ago but worsened over past week.  Reports fall in bathroom 1-46mo ago hitting left side on toilet and bathtub but states she felt fine the next day.  Pain constant but increases with weightbearing and walking.  Takes Vicodin for chronic back pain but does not help hip pain.  Pain occasionally radiates down left thigh w/o numbness or tingling.    Had routine f/u with pcp 32mo ago but did not mention fall because she was not in any pain at that time.  Has moved in with son and denies any other falls or injuries.  Pt is not on blood thinners.   Pt is on home oxygen for COPD.  Past Medical History  Diagnosis Date  . COPD (chronic obstructive pulmonary disease)   . Chronic back pain   . Osteoporosis   . On home oxygen therapy   . Hypertension     Past Surgical History  Procedure Laterality Date  . Abdominal hysterectomy      History reviewed. No pertinent family history.  History  Substance Use Topics  . Smoking status: Never Smoker   . Smokeless tobacco: Never Used  . Alcohol Use: No    OB History   Grav Para Term Preterm Abortions TAB SAB Ect Mult Living                  Review of Systems  Constitutional: Negative for fever and chills.  Respiratory: Positive for cough. Negative for shortness of breath.   Cardiovascular: Negative for chest pain.  Gastrointestinal: Negative for abdominal pain.  Musculoskeletal: Positive for myalgias, back pain and arthralgias. Negative for joint swelling.  All other systems reviewed and are negative.    Allergies  Review of patient's  allergies indicates no known allergies.  Home Medications   Current Outpatient Rx  Name  Route  Sig  Dispense  Refill  . albuterol (PROVENTIL) (2.5 MG/3ML) 0.083% nebulizer solution   Nebulization   Take 2.5 mg by nebulization every 6 (six) hours as needed for wheezing or shortness of breath.         Marland Kitchen amitriptyline (ELAVIL) 10 MG tablet   Oral   Take 20 mg by mouth at bedtime.         . diphenhydramine-acetaminophen (TYLENOL PM) 25-500 MG TABS   Oral   Take 1 tablet by mouth at bedtime.         . donepezil (ARICEPT) 5 MG tablet   Oral   Take 5 mg by mouth at bedtime.         . DULoxetine (CYMBALTA) 60 MG capsule   Oral   Take 60 mg by mouth at bedtime.         Marland Kitchen estrogens, conjugated, (PREMARIN) 0.625 MG tablet   Oral   Take 0.625 mg by mouth at bedtime.         . furosemide (LASIX) 40 MG tablet   Oral   Take 40 mg by mouth at bedtime.         Marland Kitchen HYDROcodone-acetaminophen (NORCO) 10-325 MG per tablet   Oral  Take 1 tablet by mouth every 4 (four) hours as needed for pain.         . verapamil (CALAN-SR) 120 MG CR tablet   Oral   Take 120 mg by mouth at bedtime.         Marland Kitchen acetaminophen (TYLENOL) 500 MG tablet   Oral   Take 1 tablet (500 mg total) by mouth every 6 (six) hours as needed for pain.   30 tablet   0   . oxyCODONE-acetaminophen (PERCOCET/ROXICET) 5-325 MG per tablet      Take 1-2 pills every 6 hours as needed for pain   6 tablet   0     BP 135/87  Pulse 96  Temp(Src) 97.9 F (36.6 C) (Oral)  Resp 18  SpO2 92%  Physical Exam  Nursing note and vitals reviewed. Constitutional: She appears well-developed and well-nourished. No distress.  Pt lying in exam bed with nasal canula in place. NAD.  HENT:  Head: Normocephalic and atraumatic.  Eyes: Conjunctivae are normal. No scleral icterus.  Neck: Normal range of motion. Neck supple.  Cardiovascular: Normal rate, regular rhythm and normal heart sounds.   Pulmonary/Chest: Effort  normal. No respiratory distress. She has wheezes (bibasilar ). She has rales ( bibasilar). She exhibits no tenderness.  Abdominal: Soft. Bowel sounds are normal. She exhibits no distension and no mass. There is no tenderness. There is no rebound and no guarding.  Musculoskeletal: Normal range of motion. She exhibits tenderness ( Left buttocks over sciatic notch).  Neurological: She is alert.  Skin: Skin is warm and dry. She is not diaphoretic.    ED Course  Procedures (including critical care time)  Labs Reviewed - No data to display Dg Pelvis 1-2 Views  05/01/2013  *RADIOLOGY REPORT*  Clinical Data: Left hip pain for 3 weeks.  No recent trauma.  PELVIS - 1-2 VIEW  Comparison: None.  Findings: Both hips are located.  No fracture is identified.  No notable degenerative change about the hips.  Lower lumbar degenerative disease is incompletely evaluated.  IMPRESSION:  1.  No acute finding.  Normal appearing hips. 2.  Lower lumbar spondylosis.   Original Report Authenticated By: Holley Dexter, M.D.      1. Left hip pain       MDM  Pt with hx of osteoporosis c/o left hip pain x2wks. Reports fall in bathroom 1-54mo ago where she landed on left side on tub and toilet.  Pain worse with weight bearing.  PE: ttp left buttocks over sciatic notch. Plain films pelvis: no fx, nl appearing hips. Lower lumbar spondylosis  Tx pain in ED with Vicodin which brought pain for 10/10 to 8/10. Discussed with Dr. Karma Ganja who also evaluated pt.  Will discharge pt home on pain medications and have f/u with PCP Dr. Jeannetta Nap and Dr. Corliss Skains, radiology, who manages her chronic back pain. Advised pt to alternate ice and heat.  May use a warm damp washcloth since pt is unable to use electric heat due to home oxygen use.  Rx: acetaminophen and percocet.    Vitals: unremarkable. Discharged in stable condition.    Discussed pt with attending during ED encounter.         Junius Finner, PA-C 05/01/13 1643

## 2013-05-01 NOTE — ED Notes (Signed)
Pt states a lil over a week ago started having L hip/back pain, states a month ago fell in bathroom hitting L side on toilet and bath tub, states unsure if this is related.

## 2013-05-03 ENCOUNTER — Other Ambulatory Visit (HOSPITAL_COMMUNITY): Payer: Self-pay | Admitting: Interventional Radiology

## 2013-05-03 DIAGNOSIS — M549 Dorsalgia, unspecified: Secondary | ICD-10-CM

## 2013-05-04 ENCOUNTER — Ambulatory Visit (HOSPITAL_COMMUNITY)
Admission: RE | Admit: 2013-05-04 | Discharge: 2013-05-04 | Disposition: A | Discharge status: Home / Self Care | Payer: Medicare Other | Source: Ambulatory Visit | Attending: Interventional Radiology | Admitting: Interventional Radiology

## 2013-05-04 DIAGNOSIS — M48061 Spinal stenosis, lumbar region without neurogenic claudication: Secondary | ICD-10-CM | POA: Insufficient documentation

## 2013-05-04 DIAGNOSIS — W19XXXA Unspecified fall, initial encounter: Secondary | ICD-10-CM | POA: Insufficient documentation

## 2013-05-04 DIAGNOSIS — S322XXA Fracture of coccyx, initial encounter for closed fracture: Secondary | ICD-10-CM | POA: Insufficient documentation

## 2013-05-04 DIAGNOSIS — S3210XA Unspecified fracture of sacrum, initial encounter for closed fracture: Secondary | ICD-10-CM | POA: Insufficient documentation

## 2013-05-04 DIAGNOSIS — M549 Dorsalgia, unspecified: Secondary | ICD-10-CM

## 2013-05-06 ENCOUNTER — Encounter (HOSPITAL_COMMUNITY): Payer: Self-pay | Admitting: Cardiology

## 2013-05-06 ENCOUNTER — Emergency Department (HOSPITAL_COMMUNITY)
Admission: EM | Admit: 2013-05-06 | Discharge: 2013-05-06 | Disposition: A | Discharge status: Home / Self Care | Payer: Medicare Other | Attending: Emergency Medicine | Admitting: Emergency Medicine

## 2013-05-06 DIAGNOSIS — S79929A Unspecified injury of unspecified thigh, initial encounter: Secondary | ICD-10-CM | POA: Insufficient documentation

## 2013-05-06 DIAGNOSIS — Y9289 Other specified places as the place of occurrence of the external cause: Secondary | ICD-10-CM | POA: Insufficient documentation

## 2013-05-06 DIAGNOSIS — J4489 Other specified chronic obstructive pulmonary disease: Secondary | ICD-10-CM | POA: Insufficient documentation

## 2013-05-06 DIAGNOSIS — S3210XA Unspecified fracture of sacrum, initial encounter for closed fracture: Secondary | ICD-10-CM | POA: Insufficient documentation

## 2013-05-06 DIAGNOSIS — J449 Chronic obstructive pulmonary disease, unspecified: Secondary | ICD-10-CM | POA: Insufficient documentation

## 2013-05-06 DIAGNOSIS — Z9981 Dependence on supplemental oxygen: Secondary | ICD-10-CM | POA: Insufficient documentation

## 2013-05-06 DIAGNOSIS — Y9389 Activity, other specified: Secondary | ICD-10-CM | POA: Insufficient documentation

## 2013-05-06 DIAGNOSIS — W1809XA Striking against other object with subsequent fall, initial encounter: Secondary | ICD-10-CM | POA: Insufficient documentation

## 2013-05-06 DIAGNOSIS — I10 Essential (primary) hypertension: Secondary | ICD-10-CM | POA: Insufficient documentation

## 2013-05-06 DIAGNOSIS — Z8739 Personal history of other diseases of the musculoskeletal system and connective tissue: Secondary | ICD-10-CM | POA: Insufficient documentation

## 2013-05-06 DIAGNOSIS — S322XXA Fracture of coccyx, initial encounter for closed fracture: Secondary | ICD-10-CM | POA: Insufficient documentation

## 2013-05-06 DIAGNOSIS — S79919A Unspecified injury of unspecified hip, initial encounter: Secondary | ICD-10-CM | POA: Insufficient documentation

## 2013-05-06 DIAGNOSIS — Z79899 Other long term (current) drug therapy: Secondary | ICD-10-CM | POA: Insufficient documentation

## 2013-05-06 MED ORDER — OXYCODONE-ACETAMINOPHEN 5-325 MG PO TABS
1.0000 | ORAL_TABLET | Freq: Once | ORAL | Status: AC
  Administered 2013-05-06: 1 via ORAL
  Filled 2013-05-06: qty 1

## 2013-05-06 MED ORDER — OXYCODONE-ACETAMINOPHEN 5-325 MG PO TABS: ORAL_TABLET | ORAL | Status: DC

## 2013-05-06 NOTE — ED Notes (Addendum)
Reports worsening back pain x 1 week. Denies injury. Ran out of pain meds, will see MD Monday

## 2013-05-06 NOTE — ED Notes (Signed)
Pt reports a hx of chronic back pain that has increased over the past couple of days. States she was seen at [facility] for the same a couple of days ago. Reports she followed up with an MRI but did not get the results. States the pain is now severe and she is unsure why. Reports more pain in the left hip.

## 2013-05-06 NOTE — ED Notes (Signed)
Pt ambulated inside the room, she c/o no weakness or dizziness just pain in pt back. PA notified.

## 2013-05-06 NOTE — ED Provider Notes (Signed)
History     CSN: [id number]  Arrival date & time 05/06/13  1253   First MD Initiated Contact with Patient 05/06/13 1311      Chief Complaint  Patient presents with  . Back Pain    (Consider location/radiation/quality/duration/timing/severity/associated sxs/prior treatment) HPI  77 year old female with history of chronic back pain, osteoporosis currently on home oxygen therapy presents complaining of pain to her low back and left hip. Patient recall falling 2 months ago hitting left side of the hip against a bathtub.   She has some pain after the fall but that has resolved. Pain has been increasing in intensity for the past week.  Described as sharp, stabbing sensation to L buttock, non radiating, worse with weight bearing and improves with rest.  Occasional tingling sensation to L leg but none currently .  NO fever, chills, cp, sob, abd pain, dysuria, hematuria, rectal pain or rash.  No urinary/bowel incontinence or saddle paresthesia.  Has had multiple kyphoplasty procedures to her low back in the past.  Has a significant hx of osteoporosis.  Pt has been taking percocet which has helped but ran out.  Had an MRI Lspine done recently which was ordered by her doctor, however unable to get in touch for the result yet.  Plan to f/u with her doctor on Monday but unable to bear the pain.    Past Medical History  Diagnosis Date  . COPD (chronic obstructive pulmonary disease)   . Chronic back pain   . Osteoporosis   . On home oxygen therapy   . Hypertension     Past Surgical History  Procedure Laterality Date  . Abdominal hysterectomy      History reviewed. No pertinent family history.  History  Substance Use Topics  . Smoking status: Never Smoker   . Smokeless tobacco: Never Used  . Alcohol Use: No    OB History   Grav Para Term Preterm Abortions TAB SAB Ect Mult Living                  Review of Systems  Constitutional:       10 Systems reviewed and all are negative for  acute change except as noted in the HPI.     Allergies  Review of patient's allergies indicates no known allergies.  Home Medications   Current Outpatient Rx  Name  Route  Sig  Dispense  Refill  . acetaminophen (TYLENOL) 500 MG tablet   Oral   Take 1 tablet (500 mg total) by mouth every 6 (six) hours as needed for pain.   30 tablet   0   . albuterol (PROVENTIL) (2.5 MG/3ML) 0.083% nebulizer solution   Nebulization   Take 2.5 mg by nebulization every 6 (six) hours as needed for wheezing or shortness of breath.         Marland Kitchen amitriptyline (ELAVIL) 10 MG tablet   Oral   Take 20 mg by mouth at bedtime.         . diphenhydramine-acetaminophen (TYLENOL PM) 25-500 MG TABS   Oral   Take 1 tablet by mouth at bedtime.         . donepezil (ARICEPT) 5 MG tablet   Oral   Take 5 mg by mouth at bedtime.         . DULoxetine (CYMBALTA) 60 MG capsule   Oral   Take 60 mg by mouth at bedtime.         Marland Kitchen estrogens, conjugated, (PREMARIN) 0.625 MG  tablet   Oral   Take 0.625 mg by mouth at bedtime.         . furosemide (LASIX) 40 MG tablet   Oral   Take 40 mg by mouth at bedtime.         Marland Kitchen HYDROcodone-acetaminophen (NORCO) 10-325 MG per tablet   Oral   Take 1 tablet by mouth every 4 (four) hours as needed for pain.         Marland Kitchen oxyCODONE-acetaminophen (PERCOCET/ROXICET) 5-325 MG per tablet      Take 1-2 pills every 6 hours as needed for pain   6 tablet   0   . verapamil (CALAN-SR) 120 MG CR tablet   Oral   Take 120 mg by mouth at bedtime.           BP 148/74  Pulse 109  Temp(Src) 97.9 F (36.6 C) (Oral)  Resp 20  SpO2 94%  Physical Exam  Nursing note and vitals reviewed. Constitutional: She appears well-developed and well-nourished. No distress.  Eyes: Conjunctivae are normal.  Neck: Neck supple.  Cardiovascular: Intact distal pulses.   Abdominal: Soft. There is no tenderness.  Musculoskeletal: She exhibits tenderness (tenderness to L sacroiliac region  on palpation.  Increasing pain with L hip flexion and extension.  Increasing pain with ambulation.  NO deformity.  NO significant midline spine tenderness, crepitus, or stepoff). She exhibits no edema.  Neurological: She is alert.  Skin: No rash noted.  Psychiatric: She has a normal mood and affect.    ED Course  Procedures (including critical care time)  1:30 PM Pt with pain to her L buttock x 1 week.  No immediate trauma recalled.  Has remote fall 2 weeks ago hitting the L side of her hip, but sts no significant pain since that fall up until last week. MRI shows fx on the left aspect of her sacrum, which corresponds to her current focal tenderness.  Otherwise NVI. Pt able to ambulate. Plan to offer pain medication and pt will f/u with her doctor on Monday.  My attending is aware.    Labs Reviewed - No data to display Mr Lumbar Spine Wo Contrast  05/04/2013  *RADIOLOGY REPORT*  Clinical Data: Larey Seat 1 month ago.  Severe low back pain across pelvis extending to left hip.  MRI LUMBAR SPINE WITHOUT CONTRAST  Technique:  Multiplanar and multiecho pulse sequences of the lumbar spine were obtained without intravenous contrast.  Comparison: 05/15/2008 MR lumbar spine.  05/01/2013 plain film exam of the pelvis.  Findings: Last fully open disc space is labeled L5-S1.  Present examination incorporates from T8-T9 disc space through lower sacrum.  Conus T12-L1 level.  Left renal parapelvic cyst versus extrarenal type pelvis unchanged.  Fracture of the left aspect of the sacrum.  Remote T11 vertebral body anterior wedge compression fracture involving superior endplate (10% loss of height) treated with cement augmentation.  Mild retrolisthesis posterior-superior aspect of the T11 vertebra slightly greater to the right contribute to mild spinal stenosis without cord compression.  Remote L3 vertebral body compression fracture most notable centrally with 50% loss of height treated with cement augmentation. Retropulsion  posterior-superior and posterior inferior aspect of the L3 vertebra.  Superimposed degenerative changes as detailed below.  T8-9:  At the edge of the field of view with bulge and mild spinal stenosis.  T9-10:  Negative.  T10-11:  Retropulsion posterior-superior aspect 11 as noted above.  T11-12:  Negative.  T12-L1:  Negative.  L1-2:  Minimal bulge.  L2-3:  Retropulsion as noted above.  Bulge.  Facet joint degenerative changes.  Multifactorial mild spinal stenosis with a trefoil appearance.  L3-4:  Retropulsion as noted above.  Bulge.  Facet joint degenerative changes.  Mild spinal stenosis and right lateral recess stenosis.  L4-5:  Moderate facet joint degenerative changes greater on the right.  Ligamentum flavum hypertrophy.  Minimal anterior slip L4. Bulge.  Multifactorial mild spinal stenosis with a trefoil appearance.  L5-S1:  Mild to moderate facet joint degenerative changes.  Right foraminal small disc protrusion/spur with minimal deflection of the exiting right L5 nerve root.  IMPRESSION: Fracture of the left aspect of the sacrum.  Remote T11 vertebral body anterior wedge compression fracture involving superior endplate (10% loss of height) treated with cement augmentation.  Mild retrolisthesis posterior-superior aspect of the T11 vertebra slightly greater to the right contribute to mild spinal stenosis without cord compression.  Remote L3 vertebral body compression fracture most notable centrally with 50% loss of height treated with cement augmentation. Retropulsion posterior-superior and posterior inferior aspect of the L3 vertebra.  L2-3 multifactorial mild spinal stenosis with a trefoil appearance.  L3-4 multifactorial mild spinal stenosis and right lateral recess stenosis.  L4-5 multifactorial mild spinal stenosis with a trefoil appearance.  L5-S1 right foraminal small disc protrusion/spur with minimal deflection of the exiting right L5 nerve root. Appearance without significant change.  This has been  made a PRA call report utilizing dashboard call feature.   Original Report Authenticated By: Lacy Duverney, M.D.      1. Fracture of sacrum, closed, initial encounter       MDM  BP 148/74  Pulse 109  Temp(Src) 97.9 F (36.6 C) (Oral)  Resp 20  SpO2 94%  I have reviewed nursing notes and vital signs. I personally reviewed the imaging tests through PACS system  I reviewed available ER/hospitalization records thought the EMR         Fayrene Helper, [place] 05/06/13 1341

## 2013-05-08 ENCOUNTER — Other Ambulatory Visit: Payer: Self-pay | Admitting: Radiology

## 2013-05-08 ENCOUNTER — Other Ambulatory Visit (HOSPITAL_COMMUNITY): Payer: Self-pay | Admitting: Interventional Radiology

## 2013-05-08 DIAGNOSIS — IMO0002 Reserved for concepts with insufficient information to code with codable children: Secondary | ICD-10-CM

## 2013-05-09 ENCOUNTER — Other Ambulatory Visit: Payer: Self-pay | Admitting: Radiology

## 2013-05-09 NOTE — ED Provider Notes (Signed)
Medical screening examination/treatment/procedure(s) were conducted as a shared visit with non-physician practitioner(s) and myself.  I personally evaluated the patient during the encounter Pt s/p prior fall, low back/sacral area pain. No radicular pain or numbness/weakness. Lower lumbar tenderness.   Suzi Roots, MD 05/09/13 [phone]

## 2013-05-11 ENCOUNTER — Encounter (HOSPITAL_COMMUNITY): Payer: Self-pay

## 2013-05-11 ENCOUNTER — Ambulatory Visit (HOSPITAL_COMMUNITY)
Admission: RE | Admit: 2013-05-11 | Discharge: 2013-05-11 | Disposition: A | Discharge status: Home / Self Care | Payer: Medicare Other | Source: Ambulatory Visit | Attending: Interventional Radiology | Admitting: Interventional Radiology

## 2013-05-11 DIAGNOSIS — M810 Age-related osteoporosis without current pathological fracture: Secondary | ICD-10-CM | POA: Insufficient documentation

## 2013-05-11 DIAGNOSIS — I10 Essential (primary) hypertension: Secondary | ICD-10-CM | POA: Insufficient documentation

## 2013-05-11 DIAGNOSIS — J449 Chronic obstructive pulmonary disease, unspecified: Secondary | ICD-10-CM | POA: Insufficient documentation

## 2013-05-11 DIAGNOSIS — S3210XA Unspecified fracture of sacrum, initial encounter for closed fracture: Secondary | ICD-10-CM | POA: Insufficient documentation

## 2013-05-11 DIAGNOSIS — X58XXXA Exposure to other specified factors, initial encounter: Secondary | ICD-10-CM | POA: Insufficient documentation

## 2013-05-11 DIAGNOSIS — Z9981 Dependence on supplemental oxygen: Secondary | ICD-10-CM | POA: Insufficient documentation

## 2013-05-11 DIAGNOSIS — G8929 Other chronic pain: Secondary | ICD-10-CM | POA: Insufficient documentation

## 2013-05-11 DIAGNOSIS — J4489 Other specified chronic obstructive pulmonary disease: Secondary | ICD-10-CM | POA: Insufficient documentation

## 2013-05-11 DIAGNOSIS — Z79899 Other long term (current) drug therapy: Secondary | ICD-10-CM | POA: Insufficient documentation

## 2013-05-11 DIAGNOSIS — M549 Dorsalgia, unspecified: Secondary | ICD-10-CM | POA: Insufficient documentation

## 2013-05-11 DIAGNOSIS — IMO0002 Reserved for concepts with insufficient information to code with codable children: Secondary | ICD-10-CM

## 2013-05-11 LAB — CBC WITH DIFFERENTIAL/PLATELET
Hemoglobin: 11.3 g/dL — ABNORMAL LOW (ref 12.0–15.0)
Lymphs Abs: 1.7 10*3/uL (ref 0.7–4.0)
MCH: 28.6 pg (ref 26.0–34.0)
Monocytes Relative: 4 % (ref 3–12)
Neutro Abs: 9 10*3/uL — ABNORMAL HIGH (ref 1.7–7.7)
Neutrophils Relative %: 79 % — ABNORMAL HIGH (ref 43–77)
RBC: 3.95 MIL/uL (ref 3.87–5.11)

## 2013-05-11 LAB — PROTIME-INR: INR: 0.91 (ref 0.00–1.49)

## 2013-05-11 LAB — APTT: aPTT: 29 seconds (ref 24–37)

## 2013-05-11 MED ORDER — IOHEXOL 300 MG/ML  SOLN
50.0000 mL | Freq: Once | INTRAMUSCULAR | Status: AC | PRN
  Administered 2013-05-11: 5 mL

## 2013-05-11 MED ORDER — SODIUM CHLORIDE 0.9 % IV SOLN: INTRAVENOUS | Status: DC

## 2013-05-11 MED ORDER — ONDANSETRON HCL 4 MG/2ML IJ SOLN
INTRAMUSCULAR | Status: AC
  Filled 2013-05-11: qty 2

## 2013-05-11 MED ORDER — OXYCODONE-ACETAMINOPHEN 5-325 MG PO TABS
1.0000 | ORAL_TABLET | Freq: Four times a day (QID) | ORAL | Status: DC | PRN
  Administered 2013-05-11: 2 via ORAL

## 2013-05-11 MED ORDER — HYDROMORPHONE HCL PF 1 MG/ML IJ SOLN
INTRAMUSCULAR | Status: AC
  Filled 2013-05-11: qty 3

## 2013-05-11 MED ORDER — MIDAZOLAM HCL 2 MG/2ML IJ SOLN
INTRAMUSCULAR | Status: AC
  Filled 2013-05-11: qty 6

## 2013-05-11 MED ORDER — CEFAZOLIN SODIUM-DEXTROSE 2-3 GM-% IV SOLR
INTRAVENOUS | Status: AC
  Filled 2013-05-11: qty 50

## 2013-05-11 MED ORDER — SODIUM CHLORIDE 0.9 % IV SOLN: INTRAVENOUS | Status: AC

## 2013-05-11 MED ORDER — CEFAZOLIN SODIUM-DEXTROSE 2-3 GM-% IV SOLR
2.0000 g | Freq: Once | INTRAVENOUS | Status: AC
  Administered 2013-05-11: 2 g via INTRAVENOUS

## 2013-05-11 MED ORDER — TOBRAMYCIN SULFATE 1.2 G IJ SOLR
INTRAMUSCULAR | Status: AC
  Administered 2013-05-11: 1.2 g
  Filled 2013-05-11: qty 1.2

## 2013-05-11 MED ORDER — MIDAZOLAM HCL 2 MG/2ML IJ SOLN
INTRAMUSCULAR | Status: AC | PRN
  Administered 2013-05-11 (×4): 1 mg via INTRAVENOUS

## 2013-05-11 MED ORDER — ONDANSETRON HCL 4 MG/2ML IJ SOLN
INTRAMUSCULAR | Status: AC | PRN
  Administered 2013-05-11: 4 mg via INTRAVENOUS

## 2013-05-11 MED ORDER — FENTANYL CITRATE 0.05 MG/ML IJ SOLN
INTRAMUSCULAR | Status: AC | PRN
  Administered 2013-05-11 (×4): 25 ug via INTRAVENOUS

## 2013-05-11 MED ORDER — FENTANYL CITRATE 0.05 MG/ML IJ SOLN
INTRAMUSCULAR | Status: AC
  Filled 2013-05-11: qty 4

## 2013-05-11 MED ORDER — GELATIN ABSORBABLE 12-7 MM EX MISC
CUTANEOUS | Status: AC
  Filled 2013-05-11: qty 1

## 2013-05-11 MED ORDER — OXYCODONE-ACETAMINOPHEN 5-325 MG PO TABS
ORAL_TABLET | ORAL | Status: AC
  Filled 2013-05-11: qty 2

## 2013-05-11 NOTE — H&P (Signed)
Chief Complaint: "Im here for a back fracture" HPI: [name] is an 77 y.o. female with findings of a sacral fracture that is causing her quite a bit of pain. She has had prior VP at the T11, L3 levels, but this new sacral fracture was recently found after a trip to the ER and MRI was done PMHx and meds reviewed Pt feels ok, no CP, SOB, fevers, illness  Past Medical History:  Past Medical History  Diagnosis Date  . COPD (chronic obstructive pulmonary disease)   . Chronic back pain   . Osteoporosis   . On home oxygen therapy   . Hypertension     Past Surgical History:  Past Surgical History  Procedure Laterality Date  . Abdominal hysterectomy      Family History: No family history on file.  Social History:  reports that she has never smoked. She has never used smokeless tobacco. She reports that she does not drink alcohol or use illicit drugs.  Allergies: No Known Allergies  Medications: Elavil, Aricept, Cymbalta, Premarin, Lasix, Calan-SR, Proventil  Please HPI for pertinent positives, otherwise complete 10 system ROS negative.  Physical Exam: Blood pressure 146/67, pulse 81, temperature 97.9 F (36.6 C), temperature source Oral, resp. rate 18, height 5' (1.524 m), weight 120 lb (54.432 kg), SpO2 98.00%. Body mass index is 23.44 kg/(m^2).   General Appearance:  Alert, cooperative, no distress, appears stated age  Head:  Normocephalic, without obvious abnormality, atraumatic  ENT: Unremarkable  Neck: Supple, symmetrical, trachea midline, no adenopathy, thyroid: not enlarged, symmetric, no tenderness/mass/nodules  Lungs:   Clear to auscultation bilaterally, no w/r/r, respirations unlabored without use of accessory muscles.  Back:  Tender upper sacral/lower lumbar region  Heart:  Regular rate and rhythm, S1, S2 normal, no murmur, rub or gallop. Carotids 2+ without bruit.  Neurologic: Normal affect, no gross deficits.   Results for orders placed during the hospital  encounter of 05/11/13 (from the past 48 hour(s))  CBC WITH DIFFERENTIAL     Status: Abnormal   Collection Time    05/11/13  9:14 AM      Result Value Range   WBC 11.3 (*) 4.0 - 10.5 K/uL   RBC 3.95  3.87 - 5.11 MIL/uL   Hemoglobin 11.3 (*) 12.0 - 15.0 g/dL   HCT 16.1  09.6 - 04.5 %   MCV 91.6  78.0 - 100.0 fL   MCH 28.6  26.0 - 34.0 pg   MCHC 31.2  30.0 - 36.0 g/dL   RDW 40.9  81.1 - 91.4 %   Platelets 435 (*) 150 - 400 K/uL   Neutrophils Relative % 79 (*) 43 - 77 %   Neutro Abs 9.0 (*) 1.7 - 7.7 K/uL   Lymphocytes Relative 15  12 - 46 %   Lymphs Abs 1.7  0.7 - 4.0 K/uL   Monocytes Relative 4  3 - 12 %   Monocytes Absolute 0.5  0.1 - 1.0 K/uL   Eosinophils Relative 1  0 - 5 %   Eosinophils Absolute 0.2  0.0 - 0.7 K/uL   Basophils Relative 0  0 - 1 %   Basophils Absolute 0.0  0.0 - 0.1 K/uL   No results found.  Assessment/Plan Sacral fracture Discussed sacroplasty procedure, risks, complications, use of sedation Labs pending Consent signed in chart  Brayton El PA-C 05/11/2013, 9:51 AM

## 2013-05-11 NOTE — Procedures (Signed)
S/P bilateral  S1 vertebroplasty(sacroplasty) for sacral fracture.

## 2013-05-12 ENCOUNTER — Other Ambulatory Visit (HOSPITAL_COMMUNITY): Payer: Self-pay | Admitting: Interventional Radiology

## 2013-05-12 DIAGNOSIS — IMO0002 Reserved for concepts with insufficient information to code with codable children: Secondary | ICD-10-CM

## 2013-05-25 ENCOUNTER — Ambulatory Visit (HOSPITAL_COMMUNITY): Admission: RE | Admit: 2013-05-25 | Payer: Medicare Other | Source: Ambulatory Visit

## 2013-06-01 ENCOUNTER — Ambulatory Visit (HOSPITAL_COMMUNITY)
Admission: RE | Admit: 2013-06-01 | Discharge: 2013-06-01 | Disposition: A | Discharge status: Home / Self Care | Payer: Medicare Other | Source: Ambulatory Visit | Attending: Interventional Radiology | Admitting: Interventional Radiology

## 2013-06-01 DIAGNOSIS — IMO0002 Reserved for concepts with insufficient information to code with codable children: Secondary | ICD-10-CM

## 2013-06-09 ENCOUNTER — Other Ambulatory Visit: Payer: Self-pay | Admitting: Gastroenterology

## 2013-06-09 DIAGNOSIS — R109 Unspecified abdominal pain: Secondary | ICD-10-CM

## 2013-06-12 ENCOUNTER — Ambulatory Visit
Admission: RE | Admit: 2013-06-12 | Discharge: 2013-06-12 | Disposition: A | Discharge status: Home / Self Care | Payer: Medicare Other | Source: Ambulatory Visit | Attending: Gastroenterology | Admitting: Gastroenterology

## 2013-06-12 DIAGNOSIS — R109 Unspecified abdominal pain: Secondary | ICD-10-CM

## 2013-06-12 MED ORDER — IOHEXOL 300 MG/ML  SOLN
100.0000 mL | Freq: Once | INTRAMUSCULAR | Status: AC | PRN
  Administered 2013-06-12: 100 mL via INTRAVENOUS

## 2013-06-14 ENCOUNTER — Other Ambulatory Visit: Payer: Self-pay | Admitting: Gastroenterology

## 2013-06-14 ENCOUNTER — Encounter (HOSPITAL_COMMUNITY): Payer: Self-pay | Admitting: *Deleted

## 2013-06-14 ENCOUNTER — Encounter (HOSPITAL_COMMUNITY): Payer: Self-pay | Admitting: Pharmacy Technician

## 2013-06-14 DIAGNOSIS — Z9981 Dependence on supplemental oxygen: Secondary | ICD-10-CM

## 2013-06-14 HISTORY — DX: Dependence on supplemental oxygen: Z99.81

## 2013-06-14 HISTORY — PX: OTHER SURGICAL HISTORY: SHX169

## 2013-06-15 ENCOUNTER — Ambulatory Visit (HOSPITAL_COMMUNITY): Payer: Medicare Other | Admitting: Anesthesiology

## 2013-06-15 ENCOUNTER — Ambulatory Visit (HOSPITAL_COMMUNITY)
Admission: RE | Admit: 2013-06-15 | Discharge: 2013-06-15 | Disposition: A | Discharge status: Home / Self Care | Payer: Medicare Other | Source: Ambulatory Visit | Attending: Gastroenterology | Admitting: Gastroenterology

## 2013-06-15 ENCOUNTER — Encounter (HOSPITAL_COMMUNITY): Payer: Self-pay | Admitting: Anesthesiology

## 2013-06-15 ENCOUNTER — Encounter (HOSPITAL_COMMUNITY)
Admission: RE | Disposition: A | Discharge status: Home / Self Care | Payer: Self-pay | Source: Ambulatory Visit | Attending: Gastroenterology

## 2013-06-15 ENCOUNTER — Encounter (HOSPITAL_COMMUNITY): Payer: Self-pay

## 2013-06-15 DIAGNOSIS — K259 Gastric ulcer, unspecified as acute or chronic, without hemorrhage or perforation: Secondary | ICD-10-CM | POA: Insufficient documentation

## 2013-06-15 DIAGNOSIS — K319 Disease of stomach and duodenum, unspecified: Secondary | ICD-10-CM | POA: Insufficient documentation

## 2013-06-15 DIAGNOSIS — K921 Melena: Secondary | ICD-10-CM | POA: Diagnosis present

## 2013-06-15 DIAGNOSIS — K2970 Gastritis, unspecified, without bleeding: Secondary | ICD-10-CM | POA: Insufficient documentation

## 2013-06-15 DIAGNOSIS — R1084 Generalized abdominal pain: Secondary | ICD-10-CM | POA: Diagnosis present

## 2013-06-15 DIAGNOSIS — K449 Diaphragmatic hernia without obstruction or gangrene: Secondary | ICD-10-CM | POA: Insufficient documentation

## 2013-06-15 DIAGNOSIS — D649 Anemia, unspecified: Secondary | ICD-10-CM | POA: Insufficient documentation

## 2013-06-15 DIAGNOSIS — R109 Unspecified abdominal pain: Secondary | ICD-10-CM | POA: Insufficient documentation

## 2013-06-15 HISTORY — PX: ESOPHAGOGASTRODUODENOSCOPY: SHX5428

## 2013-06-15 SURGERY — EGD (ESOPHAGOGASTRODUODENOSCOPY)
Anesthesia: Monitor Anesthesia Care

## 2013-06-15 MED ORDER — PROPOFOL INFUSION 10 MG/ML OPTIME
INTRAVENOUS | Status: DC | PRN
  Administered 2013-06-15: 140 ug/kg/min via INTRAVENOUS

## 2013-06-15 MED ORDER — LACTATED RINGERS IV SOLN
INTRAVENOUS | Status: DC
  Administered 2013-06-15: 13:00:00 via INTRAVENOUS

## 2013-06-15 MED ORDER — BUTAMBEN-TETRACAINE-BENZOCAINE 2-2-14 % EX AERO
INHALATION_SPRAY | CUTANEOUS | Status: DC | PRN
  Administered 2013-06-15: 1 via TOPICAL

## 2013-06-15 MED ORDER — LACTATED RINGERS IV SOLN
INTRAVENOUS | Status: DC | PRN
  Administered 2013-06-15: 14:00:00 via INTRAVENOUS

## 2013-06-15 MED ORDER — OMEPRAZOLE MAGNESIUM 20 MG PO TBEC: 20.0000 mg | DELAYED_RELEASE_TABLET | Freq: Every day | ORAL | Status: DC

## 2013-06-15 MED ORDER — SODIUM CHLORIDE 0.9 % IV SOLN: INTRAVENOUS | Status: DC

## 2013-06-15 NOTE — Op Note (Signed)
[facility] [address] [place], [address]   ENDOSCOPY PROCEDURE REPORT  PATIENT: [name], [name]  MR#: [id number] BIRTHDATE: 02/02/1936 , 76  yrs. old GENDER: Female  ENDOSCOPIST: Charlott Rakes, MD REFERRED [id number] Jeannetta Nap, M.D. ; Willis Modena, M.D.  PROCEDURE DATE:  06/15/2013 PROCEDURE:   EGD w/ biopsy ASA CLASS:   Class III INDICATIONS:Anemia.   abdominal pain.   Melena. MEDICATIONS: See Anesthesia Report.  TOPICAL ANESTHETIC:  DESCRIPTION OF PROCEDURE:   After the risks benefits and alternatives of the procedure were thoroughly explained, informed consent was obtained.  The PENTAX GASTOROSCOPE [id number]  endoscope was introduced through the mouth and advanced to the second portion of the duodenum , limited by Without limitations.   The instrument was slowly withdrawn as the mucosa was fully examined.     FINDINGS: The endoscope was inserted into the oropharynx and esophagus was intubated.  The esophagus was normal in appearance. The gastroesophageal junction was noted to be 38 cm from the incisors. Endoscope was advanced into the stomach, which revealed a few small superficial ulcerations and erosions in the distal stomach with minimal surrounding erythema.  The endoscope was advanced to the duodenal bulb and second portion of duodenum which were unremarkable. Biopsies were taken of the duodenum to check for celiac sprue. Biopsies were taken of the distal stomach for histologic purposes.  The endoscope was withdrawn back into the stomach and retroflexion revealed a small hiatal hernia.  COMPLICATIONS: None  ENDOSCOPIC IMPRESSION:     1. Minimal gastric ulcerations and gastritis - s/p biopsies 2. Small hiatal hernia o/w normal EGD 3. S/P duodenal biopsies to look for celiac sprue 4. No definite source of anemia unless villous blunting seen on duodenal biopsies  RECOMMENDATIONS: F/U on path; PPI QD; Avoid NSAIDs; F/U with Dr. Dulce Sellar as  previously planned   REPEAT EXAM: N/A  _______________________________ Charlott Rakes, MD eSigned:  Charlott Rakes, MD 06/15/2013 2:23 PM    [id number] Jeannetta Nap, MD  PATIENT NAME:  [name] MR#: [id number]

## 2013-06-15 NOTE — Preoperative (Signed)
Beta Blockers   Reason not to administer Beta Blockers:Not Applicable 

## 2013-06-15 NOTE — Anesthesia Postprocedure Evaluation (Signed)
Anesthesia Post Note  Patient: [name]  Procedure(s) Performed: Procedure(s) (LRB): ESOPHAGOGASTRODUODENOSCOPY (EGD) (N/A)  Anesthesia type: MAC  Patient location: PACU  Post pain: Pain level controlled  Post assessment: Post-op Vital signs reviewed  Last Vitals: BP 142/67  Pulse 89  Temp(Src) 36.7 C (Oral)  Resp 21  SpO2 93%  Post vital signs: Reviewed  Level of consciousness: awake  Complications: No apparent anesthesia complications

## 2013-06-15 NOTE — Anesthesia Preprocedure Evaluation (Signed)
Anesthesia Evaluation  Patient identified by MRN, date of birth, ID band Patient awake    Reviewed: Allergy & Precautions, H&P , NPO status , Patient's Chart, lab work & pertinent test results  Airway       Dental  (+) Dental Advisory Given   Pulmonary COPD COPD inhaler,          Cardiovascular hypertension, Pt. on medications     Neuro/Psych negative neurological ROS  negative psych ROS   GI/Hepatic negative GI ROS, Neg liver ROS,   Endo/Other  negative endocrine ROS  Renal/GU negative Renal ROS     Musculoskeletal negative musculoskeletal ROS (+)   Abdominal   Peds  Hematology negative hematology ROS (+)   Anesthesia Other Findings   Reproductive/Obstetrics negative OB ROS                           Anesthesia Physical Anesthesia Plan  ASA: III  Anesthesia Plan: MAC   Post-op Pain Management:    Induction: Intravenous  Airway Management Planned: Simple Face Mask and Nasal Cannula  Additional Equipment:   Intra-op Plan:   Post-operative Plan:   Informed Consent: I have reviewed the patients History and Physical, chart, labs and discussed the procedure including the risks, benefits and alternatives for the proposed anesthesia with the patient or authorized representative who has indicated his/her understanding and acceptance.   Dental advisory given  Plan Discussed with: CRNA  Anesthesia Plan Comments:         Anesthesia Quick Evaluation

## 2013-06-15 NOTE — Addendum Note (Signed)
Addended by: Charlott Rakes on: 06/15/2013 12:54 PM   Modules accepted: Orders

## 2013-06-15 NOTE — Interval H&P Note (Signed)
History and Physical Interval Note:  06/15/2013 1:54 PM  [name]  has presented today for surgery, with the diagnosis of low iron  The various methods of treatment have been discussed with the patient and family. After consideration of risks, benefits and other options for treatment, the patient has consented to  Procedure(s): ESOPHAGOGASTRODUODENOSCOPY (EGD) (N/A) as a surgical intervention .  The patient's history has been reviewed, patient examined, no change in status, stable for surgery.  I have reviewed the patient's chart and labs.  Questions were answered to the patient's satisfaction.     Niemah Schwebke C.

## 2013-06-15 NOTE — Transfer of Care (Signed)
Immediate Anesthesia Transfer of Care Note  Patient: [name]  Procedure(s) Performed: Procedure(s): ESOPHAGOGASTRODUODENOSCOPY (EGD) (N/A)  Patient Location: PACU and Endoscopy Unit  Anesthesia Type:MAC  Level of Consciousness: awake, alert , oriented and patient cooperative  Airway & Oxygen Therapy: Patient Spontanous Breathing and Patient connected to nasal cannula oxygen  Post-op Assessment: Report given to PACU RN and Post -op Vital signs reviewed and stable  Post vital signs: Reviewed and stable  Complications: No apparent anesthesia complications

## 2013-06-15 NOTE — H&P (Signed)
  Date of Initial H&P: 06/09/13  History reviewed, patient examined, no change in status, stable for surgery. EGD due to blood in stool, anemia, and abdominal pain.

## 2013-06-16 ENCOUNTER — Encounter (HOSPITAL_COMMUNITY): Payer: Self-pay | Admitting: Gastroenterology

## 2014-03-10 ENCOUNTER — Encounter (HOSPITAL_COMMUNITY): Payer: Self-pay | Admitting: Emergency Medicine

## 2014-03-10 ENCOUNTER — Inpatient Hospital Stay (HOSPITAL_COMMUNITY)
Admission: EM | Admit: 2014-03-10 | Discharge: 2014-03-13 | DRG: 177 | Disposition: A | Discharge status: Home / Self Care | Payer: Medicare Other | Attending: Internal Medicine | Admitting: Internal Medicine

## 2014-03-10 ENCOUNTER — Emergency Department (HOSPITAL_COMMUNITY)
Admission: EM | Admit: 2014-03-10 | Discharge: 2014-03-10 | Disposition: A | Discharge status: Home / Self Care | Payer: Medicare Other | Source: Home / Self Care | Attending: Emergency Medicine | Admitting: Emergency Medicine

## 2014-03-10 ENCOUNTER — Emergency Department (HOSPITAL_COMMUNITY): Payer: Medicare Other

## 2014-03-10 DIAGNOSIS — Z87891 Personal history of nicotine dependence: Secondary | ICD-10-CM | POA: Insufficient documentation

## 2014-03-10 DIAGNOSIS — R040 Epistaxis: Secondary | ICD-10-CM | POA: Insufficient documentation

## 2014-03-10 DIAGNOSIS — G8929 Other chronic pain: Secondary | ICD-10-CM | POA: Insufficient documentation

## 2014-03-10 DIAGNOSIS — Z79899 Other long term (current) drug therapy: Secondary | ICD-10-CM

## 2014-03-10 DIAGNOSIS — F3289 Other specified depressive episodes: Secondary | ICD-10-CM | POA: Diagnosis present

## 2014-03-10 DIAGNOSIS — J4489 Other specified chronic obstructive pulmonary disease: Secondary | ICD-10-CM

## 2014-03-10 DIAGNOSIS — Z8739 Personal history of other diseases of the musculoskeletal system and connective tissue: Secondary | ICD-10-CM | POA: Insufficient documentation

## 2014-03-10 DIAGNOSIS — T502X5A Adverse effect of carbonic-anhydrase inhibitors, benzothiadiazides and other diuretics, initial encounter: Secondary | ICD-10-CM | POA: Diagnosis present

## 2014-03-10 DIAGNOSIS — D509 Iron deficiency anemia, unspecified: Secondary | ICD-10-CM | POA: Diagnosis present

## 2014-03-10 DIAGNOSIS — K921 Melena: Secondary | ICD-10-CM

## 2014-03-10 DIAGNOSIS — I10 Essential (primary) hypertension: Secondary | ICD-10-CM

## 2014-03-10 DIAGNOSIS — J690 Pneumonitis due to inhalation of food and vomit: Principal | ICD-10-CM

## 2014-03-10 DIAGNOSIS — E876 Hypokalemia: Secondary | ICD-10-CM

## 2014-03-10 DIAGNOSIS — F329 Major depressive disorder, single episode, unspecified: Secondary | ICD-10-CM | POA: Diagnosis present

## 2014-03-10 DIAGNOSIS — Z9981 Dependence on supplemental oxygen: Secondary | ICD-10-CM

## 2014-03-10 DIAGNOSIS — J9600 Acute respiratory failure, unspecified whether with hypoxia or hypercapnia: Secondary | ICD-10-CM

## 2014-03-10 DIAGNOSIS — J449 Chronic obstructive pulmonary disease, unspecified: Secondary | ICD-10-CM | POA: Insufficient documentation

## 2014-03-10 DIAGNOSIS — J441 Chronic obstructive pulmonary disease with (acute) exacerbation: Secondary | ICD-10-CM | POA: Insufficient documentation

## 2014-03-10 DIAGNOSIS — D649 Anemia, unspecified: Secondary | ICD-10-CM

## 2014-03-10 DIAGNOSIS — Z720 Tobacco use: Secondary | ICD-10-CM | POA: Diagnosis present

## 2014-03-10 DIAGNOSIS — R1084 Generalized abdominal pain: Secondary | ICD-10-CM

## 2014-03-10 DIAGNOSIS — D62 Acute posthemorrhagic anemia: Secondary | ICD-10-CM | POA: Diagnosis present

## 2014-03-10 DIAGNOSIS — F17200 Nicotine dependence, unspecified, uncomplicated: Secondary | ICD-10-CM

## 2014-03-10 DIAGNOSIS — J9601 Acute respiratory failure with hypoxia: Secondary | ICD-10-CM | POA: Diagnosis present

## 2014-03-10 DIAGNOSIS — A419 Sepsis, unspecified organism: Secondary | ICD-10-CM

## 2014-03-10 DIAGNOSIS — D473 Essential (hemorrhagic) thrombocythemia: Secondary | ICD-10-CM | POA: Diagnosis present

## 2014-03-10 LAB — CBC WITH DIFFERENTIAL/PLATELET
Basophils Absolute: 0 10*3/uL (ref 0.0–0.1)
Basophils Absolute: 0 10*3/uL (ref 0.0–0.1)
Basophils Relative: 0 % (ref 0–1)
Basophils Relative: 0 % (ref 0–1)
EOS ABS: 0.1 10*3/uL (ref 0.0–0.7)
Eosinophils Absolute: 0.2 10*3/uL (ref 0.0–0.7)
Eosinophils Relative: 2 % (ref 0–5)
Eosinophils Relative: 1 % (ref 0–5)
HCT: 30.3 % — ABNORMAL LOW (ref 36.0–46.0)
HEMATOCRIT: 30.4 % — AB (ref 36.0–46.0)
HEMOGLOBIN: 9.7 g/dL — AB (ref 12.0–15.0)
HEMOGLOBIN: 9.2 g/dL — AB (ref 12.0–15.0)
LYMPHS ABS: 1.5 10*3/uL (ref 0.7–4.0)
LYMPHS ABS: 1.8 10*3/uL (ref 0.7–4.0)
LYMPHS PCT: 18 % (ref 12–46)
Lymphocytes Relative: 14 % (ref 12–46)
MCH: 27.9 pg (ref 26.0–34.0)
MCH: 26.5 pg (ref 26.0–34.0)
MCHC: 32 g/dL (ref 30.0–36.0)
MCHC: 30.3 g/dL (ref 30.0–36.0)
MCV: 87.1 fL (ref 78.0–100.0)
MCV: 87.6 fL (ref 78.0–100.0)
MONO ABS: 0.6 10*3/uL (ref 0.1–1.0)
MONOS PCT: 5 % (ref 3–12)
MONOS PCT: 4 % (ref 3–12)
Monocytes Absolute: 0.5 10*3/uL (ref 0.1–1.0)
NEUTROS PCT: 75 % (ref 43–77)
NEUTROS PCT: 81 % — AB (ref 43–77)
Neutro Abs: 6.5 10*3/uL (ref 1.7–7.7)
Neutro Abs: 10.1 10*3/uL — ABNORMAL HIGH (ref 1.7–7.7)
PLATELETS: 407 10*3/uL — AB (ref 150–400)
Platelets: 418 10*3/uL — ABNORMAL HIGH (ref 150–400)
RBC: 3.48 MIL/uL — AB (ref 3.87–5.11)
RBC: 3.47 MIL/uL — AB (ref 3.87–5.11)
RDW: 17.2 % — ABNORMAL HIGH (ref 11.5–15.5)
RDW: 17.2 % — ABNORMAL HIGH (ref 11.5–15.5)
WBC: 8.7 10*3/uL (ref 4.0–10.5)
WBC: 12.5 10*3/uL — ABNORMAL HIGH (ref 4.0–10.5)

## 2014-03-10 LAB — CBC
HCT: 30.8 % — ABNORMAL LOW (ref 36.0–46.0)
HEMOGLOBIN: 9.3 g/dL — AB (ref 12.0–15.0)
MCH: 26.3 pg (ref 26.0–34.0)
MCHC: 30.2 g/dL (ref 30.0–36.0)
MCV: 87.3 fL (ref 78.0–100.0)
Platelets: 414 10*3/uL — ABNORMAL HIGH (ref 150–400)
RBC: 3.53 MIL/uL — ABNORMAL LOW (ref 3.87–5.11)
RDW: 16.9 % — AB (ref 11.5–15.5)
WBC: 11.9 10*3/uL — AB (ref 4.0–10.5)

## 2014-03-10 LAB — BASIC METABOLIC PANEL
BUN: 17 mg/dL (ref 6–23)
BUN: 17 mg/dL (ref 6–23)
CHLORIDE: 98 meq/L (ref 96–112)
CO2: 32 meq/L (ref 19–32)
CO2: 33 mEq/L — ABNORMAL HIGH (ref 19–32)
CREATININE: 0.64 mg/dL (ref 0.50–1.10)
Calcium: 8.6 mg/dL (ref 8.4–10.5)
Calcium: 9.1 mg/dL (ref 8.4–10.5)
Chloride: 95 mEq/L — ABNORMAL LOW (ref 96–112)
Creatinine, Ser: 0.51 mg/dL (ref 0.50–1.10)
GFR calc Af Amer: >90 mL/min (ref >90)
GFR calc Af Amer: >90 mL/min (ref >90)
GFR, EST NON AFRICAN AMERICAN: 85 mL/min — AB (ref >90)
GLUCOSE: 114 mg/dL — AB (ref 70–99)
GLUCOSE: 167 mg/dL — AB (ref 70–99)
POTASSIUM: 3.5 meq/L — AB (ref 3.7–5.3)
POTASSIUM: 3.6 meq/L — AB (ref 3.7–5.3)
SODIUM: 140 meq/L (ref 137–147)
Sodium: 139 mEq/L (ref 137–147)

## 2014-03-10 LAB — PROTIME-INR
INR: 0.9 (ref 0.00–1.49)
PROTHROMBIN TIME: 12 s (ref 11.6–15.2)

## 2014-03-10 LAB — TYPE AND SCREEN
ABO/RH(D): O POS
Antibody Screen: NEGATIVE

## 2014-03-10 LAB — ABO/RH: ABO/RH(D): O POS

## 2014-03-10 MED ORDER — SODIUM CHLORIDE 0.9 % IJ SOLN: 3.0000 mL | INTRAMUSCULAR | Status: DC | PRN

## 2014-03-10 MED ORDER — PIPERACILLIN-TAZOBACTAM 3.375 G IVPB
3.3750 g | Freq: Three times a day (TID) | INTRAVENOUS | Status: DC
  Administered 2014-03-11 – 2014-03-13 (×7): 3.375 g via INTRAVENOUS
  Filled 2014-03-10 (×11): qty 50

## 2014-03-10 MED ORDER — CEPHALEXIN 500 MG PO CAPS: 500.0000 mg | ORAL_CAPSULE | Freq: Once | ORAL | Status: DC

## 2014-03-10 MED ORDER — PANTOPRAZOLE SODIUM 40 MG PO TBEC
40.0000 mg | DELAYED_RELEASE_TABLET | Freq: Every day | ORAL | Status: DC
  Administered 2014-03-11 – 2014-03-13 (×3): 40 mg via ORAL
  Filled 2014-03-10 (×3): qty 1

## 2014-03-10 MED ORDER — PAROXETINE HCL 20 MG PO TABS
20.0000 mg | ORAL_TABLET | Freq: Every morning | ORAL | Status: DC
  Administered 2014-03-11 – 2014-03-13 (×3): 20 mg via ORAL
  Filled 2014-03-10 (×3): qty 1

## 2014-03-10 MED ORDER — SODIUM CHLORIDE 0.9 % IJ SOLN: 3.0000 mL | Freq: Two times a day (BID) | INTRAMUSCULAR | Status: DC

## 2014-03-10 MED ORDER — POTASSIUM CHLORIDE CRYS ER 20 MEQ PO TBCR
40.0000 meq | EXTENDED_RELEASE_TABLET | Freq: Once | ORAL | Status: AC
  Administered 2014-03-10: 40 meq via ORAL
  Filled 2014-03-10: qty 2

## 2014-03-10 MED ORDER — HYDROCODONE-ACETAMINOPHEN 5-325 MG PO TABS
1.0000 | ORAL_TABLET | ORAL | Status: DC | PRN
  Administered 2014-03-11 (×3): 2 via ORAL
  Administered 2014-03-11: 1 via ORAL
  Administered 2014-03-12 – 2014-03-13 (×7): 2 via ORAL
  Filled 2014-03-10: qty 1
  Filled 2014-03-10 (×10): qty 2

## 2014-03-10 MED ORDER — ESTROGENS CONJUGATED 1.25 MG PO TABS
1.2500 mg | ORAL_TABLET | Freq: Every morning | ORAL | Status: DC
  Administered 2014-03-11 – 2014-03-13 (×3): 1.25 mg via ORAL
  Filled 2014-03-10 (×3): qty 1

## 2014-03-10 MED ORDER — LORAZEPAM 1 MG PO TABS
1.0000 mg | ORAL_TABLET | Freq: Once | ORAL | Status: AC
  Administered 2014-03-10: 1 mg via ORAL
  Filled 2014-03-10: qty 1

## 2014-03-10 MED ORDER — FERROUS SULFATE 325 (65 FE) MG PO TABS
325.0000 mg | ORAL_TABLET | Freq: Every day | ORAL | Status: DC
  Administered 2014-03-11 – 2014-03-13 (×3): 325 mg via ORAL
  Filled 2014-03-10 (×4): qty 1

## 2014-03-10 MED ORDER — SODIUM CHLORIDE 0.9 % IV SOLN
250.0000 mL | INTRAVENOUS | Status: DC | PRN
  Administered 2014-03-11: 250 mL via INTRAVENOUS

## 2014-03-10 MED ORDER — CLINDAMYCIN HCL 300 MG PO CAPS
300.0000 mg | ORAL_CAPSULE | Freq: Once | ORAL | Status: AC
  Administered 2014-03-10: 300 mg via ORAL
  Filled 2014-03-10: qty 1

## 2014-03-10 MED ORDER — FUROSEMIDE 40 MG PO TABS
40.0000 mg | ORAL_TABLET | Freq: Every day | ORAL | Status: DC
  Administered 2014-03-11 – 2014-03-13 (×3): 40 mg via ORAL
  Filled 2014-03-10 (×3): qty 1

## 2014-03-10 MED ORDER — ACETAMINOPHEN 325 MG PO TABS: 650.0000 mg | ORAL_TABLET | Freq: Four times a day (QID) | ORAL | Status: DC | PRN

## 2014-03-10 MED ORDER — VERAPAMIL HCL ER 120 MG PO TBCR
120.0000 mg | EXTENDED_RELEASE_TABLET | Freq: Every morning | ORAL | Status: DC
  Administered 2014-03-11 – 2014-03-13 (×3): 120 mg via ORAL
  Filled 2014-03-10 (×3): qty 1

## 2014-03-10 MED ORDER — SODIUM CHLORIDE 0.9 % IJ SOLN
3.0000 mL | Freq: Two times a day (BID) | INTRAMUSCULAR | Status: DC
  Administered 2014-03-11: 3 mL via INTRAVENOUS

## 2014-03-10 MED ORDER — AMITRIPTYLINE HCL 10 MG PO TABS
10.0000 mg | ORAL_TABLET | Freq: Every day | ORAL | Status: DC
  Administered 2014-03-10 – 2014-03-12 (×3): 10 mg via ORAL
  Filled 2014-03-10 (×4): qty 1

## 2014-03-10 MED ORDER — CLINDAMYCIN HCL 300 MG PO CAPS: 300.0000 mg | ORAL_CAPSULE | Freq: Three times a day (TID) | ORAL | Status: DC

## 2014-03-10 MED ORDER — PIPERACILLIN-TAZOBACTAM 3.375 G IVPB 30 MIN
3.3750 g | Freq: Once | INTRAVENOUS | Status: AC
  Administered 2014-03-10: 3.375 g via INTRAVENOUS
  Filled 2014-03-10: qty 50

## 2014-03-10 MED ORDER — SODIUM CHLORIDE 0.9 % IV SOLN
INTRAVENOUS | Status: DC
  Administered 2014-03-10: via INTRAVENOUS

## 2014-03-10 MED ORDER — ACETAMINOPHEN 650 MG RE SUPP: 650.0000 mg | Freq: Four times a day (QID) | RECTAL | Status: DC | PRN

## 2014-03-10 MED ORDER — SODIUM CHLORIDE 0.9 % IV SOLN: 250.0000 mL | INTRAVENOUS | Status: DC | PRN

## 2014-03-10 NOTE — ED Notes (Signed)
Bed: PQ98 Expected date:  Expected time:  Means of arrival:  Comments: EMS Nosebleed

## 2014-03-10 NOTE — ED Notes (Signed)
Bed: WA18 Expected date:  Expected time:  Means of arrival:  Comments: Hold 

## 2014-03-10 NOTE — ED Notes (Signed)
Continues to have nose bleeds. MD in to evaluate and consult ENT.

## 2014-03-10 NOTE — Procedures (Signed)
Preop diagnosis: Right posterior epistaxis Postop diagnosis: Same Procedure: Right anterior/posterior nasal packing Surgeon: Redmond Baseman Anesth: Topical with 4% lidocaine Compl: None Findings: The right anterior nasal passage had no active bleeding source in spite of bleeding coming down the throat. Description: After discussing risks, benefits, and alternatives, the right anterior pack was deflated and removed.  The patient began bleeding from the posterior nasal passage.  The right anterior nasal passage was examined and no source of bleeding was seen.  The right nasal passage was sprayed with topical anesthetic.  A 10 cm merocel pack was coated with surgilube and then placed in the right nasal passage.  Bleeding continued for a few minutes but gradually stopped.  Lidocaine and Neo-synephrine were added to the pack.  A drip pad was added.  The patient tolerated the patient well.

## 2014-03-10 NOTE — Discharge Instructions (Signed)

## 2014-03-10 NOTE — Progress Notes (Signed)
ANTIBIOTIC CONSULT NOTE - INITIAL  Pharmacy Consult for Zosyn Indication: Aspiration Pneumonia  No Known Allergies  Patient Measurements: Height: 5' (152.4 cm) Weight: 141 lb 5 oz (64.1 kg) IBW/kg (Calculated) : 45.5  Vital Signs: Temp: 97.4 F (36.3 C) (03/14 2214) Temp src: Oral (03/14 2214) BP: 144/75 mmHg (03/14 2214) Pulse Rate: 101 (03/14 2214) Intake/Output from previous day:   Intake/Output from this shift:    Labs:  Recent Labs  03/10/14 0148 03/10/14 1109 03/10/14 2010  WBC 11.9* 8.7 12.5*  HGB 9.3* 9.7* 9.2*  PLT 414* 407* 418*  CREATININE  --  0.51 0.64   Estimated Creatinine Clearance: 50.7 ml/min (by C-G formula based on Cr of 0.64). No results found for this basename: VANCOTROUGH, VANCOPEAK, VANCORANDOM, GENTTROUGH, GENTPEAK, GENTRANDOM, TOBRATROUGH, TOBRAPEAK, TOBRARND, AMIKACINPEAK, AMIKACINTROU, AMIKACIN,  in the last 72 hours   Microbiology: No results found for this or any previous visit (from the past 720 hour(s)).  Medical History: Past Medical History  Diagnosis Date  . COPD (chronic obstructive pulmonary disease)   . Chronic back pain   . Osteoporosis   . On home oxygen therapy 06-14-13    continuos 2.5l/m nasally-24/7  . Hypertension     Medications:  Scheduled:  . sodium chloride   Intravenous STAT  . amitriptyline  10 mg Oral QHS  . [START ON 03/11/2014] estrogens (conjugated)  1.25 mg Oral q morning - 10a  . [START ON 03/11/2014] ferrous sulfate  325 mg Oral QPC breakfast  . [START ON 03/11/2014] furosemide  40 mg Oral Daily  . [START ON 03/11/2014] pantoprazole  40 mg Oral Daily  . [START ON 03/11/2014] PARoxetine  20 mg Oral q morning - 10a  . [START ON 03/11/2014] piperacillin-tazobactam (ZOSYN)  IV  3.375 g Intravenous Q8H  . potassium chloride  40 mEq Oral Once  . sodium chloride  3 mL Intravenous Q12H  . [START ON 03/11/2014] verapamil  120 mg Oral q morning - 10a   Infusions:   Assessment:  78 yr female with main  complaint of nosebleeds over last few days (previously seen in ED). Today with report of swallowing blood.  Chest Xray = + infiltrate with emphysema  Patient received Clindamycin 300mg  @ 18:27 ad Zosyn 3.375gm @ 20:52  IV Zosyn per pharmacy dosing ordered for aspiration pneumonia  Plan to collect sputum culture  Goal of Therapy:  Eradication of infection  Plan:  Zosyn 3.375gm IV q8h (each dose infused over 4 hrs)  Gwendlyon Zumbro, Toribio Harbour, PharmD 03/10/2014,11:36 PM

## 2014-03-10 NOTE — ED Notes (Signed)
Pt just here for nose bleed and rhino placed.  Has returned by EMS for same.

## 2014-03-10 NOTE — ED Notes (Signed)
Pt with nosebleed x 2 episodes starting at 2230 on 03/09/14. EMS was able to control bleeding with pressure and 2 sprays of Afrin bilateral nostril given to stop the bleed. EMS was called back a second time and at that point brought pt to the ED. Pt on o2 at home (non-hummidified). Pt has been having nose bleeds for the past week however tonight was uncontrollable after 1 hour and then she removed a large blood clot. BP 140 by EMS. Pt states that she has ingested a lot of blood.

## 2014-03-10 NOTE — Discharge Instructions (Signed)
Nosebleed  Should your nose begin bleeding again, hold pressure (HARD!) just below the bony part of your nose.  Hold pressure continuously for 15 minutes.  NO PEEKING!.  If bleeding continues, blow your nose to get rid of any clots.  Spray afrin x 2 into affected nostril.  Then apply pressure again, this time for 30 minutes.  Should your nose continue to bleed after this time, return to the ER for further treatment.  EPISTAXIS - WITHOUT PACKING  EPISTAXIS: You have been seen for Epistaxis (bloody nose).  Epistaxis in the medical term for a bloody nose. Epistaxis is a common condition and although frightening, it seldom becomes serious. The skin in the front of the nose is very fragile and in children is most often damaged by direct trauma (such as nose-picking) or when the skin becomes very dry or irritated (such as in dry environments on when you have a cold).  The bleeding often begins at the end of the nose, so pinching the entire nose for 15 minutes is often effective at stopping the bleeding.  When the bleeding area can be found, the doctor may will use a chemical called Silver Nitrate to stop the bleeding.  Other chemicals are sometimes used to stop the bleeding by causing the oozing blood to clot.  You may have had either or both of these treatments done today.  If the bleeding does not stop with chemical treatment, packing may be placed into the nose.  Although packing is sometimes uncomfortable, it is usually very effective at stopping a bloody nose.  Sometimes the bleeding starts from a blood vessel way in the back of the nose or in the throat. This type of bleeding is difficult to control and often requires nasal packing by an Ear, Nose and Throat specialist.  Applying ice to the forehead or back of the neck is not effective and WILL NOT stop the bleeding. Instead, press or squeeze the nose directly.  If the bleeding doesnt stop in 15 minutes, return here or go to the nearest Emergency  Department.  Avoid aspirin and non-steroidal anti-inflammatory medications (Advil, Motrin, Ibuprofen, Aleve, Naprosyn, etc.) and alcohol. These medications will make it difficult for your blood to clot.  Avoid blowing your nose, sneezing and straining for the next several days.  YOU SHOULD SEEK MEDICAL ATTENTION IMMEDIATELY, EITHER HERE OR AT THE NEAREST EMERGENCY DEPARTMENT, IF ANY OF THE FOLLOWING OCCURS:      You are unable to stop the bleeding with direct pressure.     You experience bleeding down the back of the throat.     The packing gets out of place.  If you develop symptoms of Shortness of Breath, Chest Pain, Swelling of lips, mouth or tongue or if your condition becomes worse with any new symptoms, see your doctor or return to the Emergency Department for immediate care. Emergency services are not intended to be a substitute for comprehensive medical attention.  Please contact your doctor for follow up if not improving as expected.   Call your doctor in 5-7 days or as directed if there is no improvement.  

## 2014-03-10 NOTE — ED Provider Notes (Signed)
CSN: [id number]     Arrival date & time 03/10/14  1044 History   First MD Initiated Contact with Patient 03/10/14 1050     Chief Complaint  Patient presents with  . Epistaxis    HPI The patient presents to the emergency room for evaluation of recurrent nosebleed. Patient has been having trouble off and on throughout this past week. She has been trying Afrin as well as pinching her nose.  She had an episode last night that brought her to the emergency room. She was treated and released. Today, she had another episode this morning about 10 AM. She could not get the bleeding to stop so she called 911. EMS applied pressure and was able to get the bleeding to stop. Patient initially noticed bleeding from the right side of her nose but subsequently was noticing bleeding on both sides. She does chronically use nasal cannula oxygen. She does not have a humidifier in her oxygen. Patient has had some nausea after swallowing blood. She denies any trouble with chest pain abdominal pain or bleeding elsewhere. Past Medical History  Diagnosis Date  . COPD (chronic obstructive pulmonary disease)   . Chronic back pain   . Osteoporosis   . On home oxygen therapy 06-14-13    continuos 2.5l/m nasally-24/7  . Hypertension    Past Surgical History  Procedure Laterality Date  . Abdominal hysterectomy    . Sacroplasty  06-14-13    05-10-13 IVR CONE for fracture stabilization  . Esophagogastroduodenoscopy N/A 06/15/2013    Procedure: ESOPHAGOGASTRODUODENOSCOPY (EGD);  Surgeon: [name], MD;  Location: [facility];  Service: Endoscopy;  Laterality: N/A;   History reviewed. No pertinent family history. History  Substance Use Topics  . Smoking status: Former Smoker    Types: Cigarettes    Quit date: 06/15/2011  . Smokeless tobacco: Never Used  . Alcohol Use: No   OB History   Grav Para Term Preterm Abortions TAB SAB Ect Mult Living                 Review of Systems  All other systems reviewed  and are negative.      Allergies  Review of patient's allergies indicates no known allergies.  Home Medications   Current Outpatient Rx  Name  Route  Sig  Dispense  Refill  . amitriptyline (ELAVIL) 10 MG tablet   Oral   Take 10 mg by mouth daily.          Marland Kitchen estrogens, conjugated, (PREMARIN) 1.25 MG tablet   Oral   Take 1.25 mg by mouth every morning.         . ferrous sulfate 325 (65 FE) MG tablet   Oral   Take 325 mg by mouth daily with breakfast.         . furosemide (LASIX) 40 MG tablet   Oral   Take 40 mg by mouth daily.          Marland Kitchen omeprazole (PRILOSEC) 40 MG capsule   Oral   Take 40 mg by mouth every morning.         Marland Kitchen PARoxetine (PAXIL) 20 MG tablet   Oral   Take 20 mg by mouth every morning.         . verapamil (CALAN-SR) 120 MG CR tablet   Oral   Take 120 mg by mouth every morning.           BP 130/106  Pulse 98  Temp(Src) 97.8 F (36.6 C) (  Oral)  Resp 18  SpO2 91% Physical Exam  Nursing note and vitals reviewed. Constitutional: No distress.  HENT:  Head: Normocephalic and atraumatic.  Right Ear: External ear normal.  Left Ear: External ear normal.  Nose: Mucosal edema present. No rhinorrhea, nose lacerations, sinus tenderness, nasal deformity or nasal septal hematoma. Epistaxis is observed.  No foreign bodies.  Erythema bilateral nares,  Clots noted right side, no active bleeding on exam  Eyes: Conjunctivae are normal. Right eye exhibits no discharge. Left eye exhibits no discharge. No scleral icterus.  Neck: Neck supple. No tracheal deviation present.  Cardiovascular: Normal rate, regular rhythm and intact distal pulses.   Pulmonary/Chest: Effort normal and breath sounds normal. No stridor. No respiratory distress. She has no wheezes. She has no rales.  Abdominal: Soft. Bowel sounds are normal. She exhibits no distension. There is no tenderness. There is no rebound and no guarding.  Musculoskeletal: She exhibits no edema and no  tenderness.  Neurological: She is alert. She has normal strength. No cranial nerve deficit (no facial droop, extraocular movements intact, no slurred speech) or sensory deficit. She exhibits normal muscle tone. She displays no seizure activity. Coordination normal.  Skin: Skin is warm and dry. No rash noted.  Psychiatric: She has a normal mood and affect.    ED Course  EPISTAXIS MANAGEMENT Date/Time: 03/10/2014 11:39 AM Performed by: Dorie Rank R Authorized by: Dorie Rank R Consent: Verbal consent obtained. Risks and benefits: risks, benefits and alternatives were discussed Consent given by: patient Patient sedated: no Treatment site: right anterior Repair method: suction and nasal balloon Treatment complexity: simple Recurrence: recurrence of recent bleed Patient tolerance: Patient tolerated the procedure well with no immediate complications.   Labs Review Labs Reviewed  CBC WITH DIFFERENTIAL - Abnormal; Notable for the following:    RBC 3.48 (*)    Hemoglobin 9.7 (*)    HCT 30.3 (*)    RDW 17.2 (*)    Platelets 407 (*)    All other components within normal limits  BASIC METABOLIC PANEL - Abnormal; Notable for the following:    Potassium 3.5 (*)    Glucose, Bld 114 (*)    All other components within normal limits     MDM   Final diagnoses:  Epistaxis    Pt has been to the ED twice now in less than 24 hours.  She noticed the bleeding initially from the right side.  No visible vessel noted.  Will place 5.5 cm rhinostat device since initial more conservative measures were not effective.  Pt is not on humidified o2.  This is likely a contributing factor.  Will recheck CBC.  Inr last night was normal.  1239  Pt monitored in the ED .  No further bleeding.  Continue nasal humidifier.  Follow ENT  Kathalene Frames, MD 03/10/14 1240

## 2014-03-10 NOTE — ED Notes (Signed)
[employer] requested for welfare check to ensure someone is home to let patient into the house.

## 2014-03-10 NOTE — Discharge Instructions (Signed)
Take clindamycin

## 2014-03-10 NOTE — H&P (Signed)
[facility] History and Physical  [name] VQQ:[id number] DOB: 07-12-1936 DOA: 03/10/2014  Referring physician:  PCP: [name], MD  Specialists:   Chief Complaint: Nosebleed  HPI: [name] is a 78 y.o. female  With a history of COPD requiring 2 L of nasal cannula at home 24 hours, hypertension, that presents emergency department for nose bleeds. Patient has been having intermittent nosebleeds over the last several days. She's come to the emergency department twice. She came last night, she was placed on aspirin and was sent home as her bleeding had resolved. Patient is not on any anticoagulation. She presented again today with more bleeding. Patient was seen in the emergency department by Dr. Redmond Baseman, ENT, and Rhino Rocket packing was placed in the right nostril. Patient was then sent home afterwards and she returned for continued nosebleeds and swallowed blood. Chest x-ray in the emergency department did show left base infiltrate with underlying emphysema. Patient was found to be hypoxic on her usual amount of oxygen via nasal cannula. At this time she denies any shortness of breath more than usual, chest pain, dizziness, abdominal pain. Patient denies any sick contacts or recent travel. She denies any trauma to her nose.  Review of Systems:  Constitutional: Denies fever, chills, diaphoresis, appetite change and fatigue.  HEENT: Complains of nasal bleeding. Respiratory: Complains of shortness of breath and requires oxygenation via nasal cannula.  Cardiovascular: Denies chest pain, palpitations and leg swelling.  Gastrointestinal: Denies nausea, vomiting, abdominal pain, diarrhea, constipation, blood in stool and abdominal distention.  Genitourinary: Denies dysuria, urgency, frequency, hematuria, flank pain and difficulty urinating.  Musculoskeletal: Denies myalgias, back pain, joint swelling, arthralgias and gait problem.  Skin: Denies pallor, rash and wound.    Neurological: Denies dizziness, seizures, syncope, weakness, light-headedness, numbness and headaches.  Hematological: Denies adenopathy. Easy bruising, personal or family bleeding history  Psychiatric/Behavioral: Denies suicidal ideation, mood changes, confusion, nervousness, sleep disturbance and agitation  Past Medical History  Diagnosis Date  . COPD (chronic obstructive pulmonary disease)   . Chronic back pain   . Osteoporosis   . On home oxygen therapy 06-14-13    continuos 2.5l/m nasally-24/7  . Hypertension    Past Surgical History  Procedure Laterality Date  . Abdominal hysterectomy    . Sacroplasty  06-14-13    05-10-13 IVR CONE for fracture stabilization  . Esophagogastroduodenoscopy N/A 06/15/2013    Procedure: ESOPHAGOGASTRODUODENOSCOPY (EGD);  Surgeon: [name], MD;  Location: [facility];  Service: Endoscopy;  Laterality: N/A;   Social History:  reports that she quit smoking about 2 years ago. Her smoking use included Cigarettes. She smoked 0.00 packs per day. She has never used smokeless tobacco. She reports that she does not drink alcohol or use illicit drugs. Lives at home. Able to do her daily activities.  No Known Allergies  History reviewed. No pertinent family history.  Prior to Admission medications   Medication Sig Start Date End Date Taking? Authorizing Provider  amitriptyline (ELAVIL) 10 MG tablet Take 10 mg by mouth daily.    Yes Historical Provider, MD  estrogens, conjugated, (PREMARIN) 1.25 MG tablet Take 1.25 mg by mouth every morning.   Yes Historical Provider, MD  ferrous sulfate 325 (65 FE) MG tablet Take 325 mg by mouth daily with breakfast.   Yes Historical Provider, MD  furosemide (LASIX) 40 MG tablet Take 40 mg by mouth daily.    Yes Historical Provider, MD  omeprazole (PRILOSEC) 40 MG capsule Take 40 mg by  mouth every morning.   Yes Historical Provider, MD  PARoxetine (PAXIL) 20 MG tablet Take 20 mg by mouth every morning.   Yes  Historical Provider, MD  verapamil (CALAN-SR) 120 MG CR tablet Take 120 mg by mouth every morning.    Yes Historical Provider, MD  clindamycin (CLEOCIN) 300 MG capsule Take 1 capsule (300 mg total) by mouth 3 (three) times daily. X 7 days 03/10/14   Wandra Arthurs, MD   Physical Exam: Filed Vitals:   03/10/14 1930  BP: 150/56  Pulse: 107  Temp: 98.5 F (36.9 C)  Resp: 20     General: Well developed, well nourished, NAD, appears stated age  HEENT: NCAT, PERRLA, EOMI, Anicteic Sclera, mucous membranes moist.  Packing noted in the right nostril. Dried blood clots noted in the oropharynx.  Neck: Supple, no JVD, no masses  Cardiovascular: S1 S2 auscultated, tachycardic. No murmurs noted.  Respiratory: Essentially Clear to auscultation bilaterally with mild expiratory wheezes.  Abdomen: Soft, obese, nontender, nondistended, + bowel sounds  Extremities: warm dry without cyanosis clubbing or edema  Neuro: AAOx3, cranial nerves grossly intact. Strength 5/5 in patient's upper and lower extremities bilaterally  Skin: Without rashes exudates or nodules  Psych: Normal affect and demeanor with intact judgement and insight  Labs on Admission:  Basic Metabolic Panel:  Recent Labs Lab 03/10/14 1109 03/10/14 2010  NA 140 139  K 3.5* 3.6*  CL 98 95*  CO2 32 33*  GLUCOSE 114* 167*  BUN 17 17  CREATININE 0.51 0.64  CALCIUM 8.6 9.1   Liver Function Tests: No results found for this basename: AST, ALT, ALKPHOS, BILITOT, PROT, ALBUMIN,  in the last 168 hours No results found for this basename: LIPASE, AMYLASE,  in the last 168 hours No results found for this basename: AMMONIA,  in the last 168 hours CBC:  Recent Labs Lab 03/10/14 0148 03/10/14 1109 03/10/14 2010  WBC 11.9* 8.7 12.5*  NEUTROABS  --  6.5 10.1*  HGB 9.3* 9.7* 9.2*  HCT 30.8* 30.3* 30.4*  MCV 87.3 87.1 87.6  PLT 414* 407* 418*   Cardiac Enzymes: No results found for this basename: CKTOTAL, CKMB, CKMBINDEX,  TROPONINI,  in the last 168 hours  BNP (last 3 results) No results found for this basename: PROBNP,  in the last 8760 hours CBG: No results found for this basename: GLUCAP,  in the last 168 hours  Radiological Exams on Admission: Dg Chest Portable 1 View  03/10/2014   CLINICAL DATA:  Hypoxia  EXAM: PORTABLE CHEST - 1 VIEW  COMPARISON:  May 08, 2008  FINDINGS: There is left base infiltrate. There is underlying emphysema. Elsewhere lungs are clear. Heart is upper normal in size with normal pulmonary vascularity. No adenopathy.  IMPRESSION: Left base infiltrate.  Underlying emphysema.   Electronically Signed   By: Lowella Grip M.D.   On: 03/10/2014 19:56    EKG: None  Assessment/Plan  Acute respiratory failure with hypoxia secondary to aspiration pneumonia Patient will be admitted to medical floor. Her aspiration is likely due to her epistaxis. Chest x-ray showed left base infiltrate with underlying emphysema. Patient was placed on IV Zosyn. Wll obtain a sputum culture and Gram stain if possible. Will continue oxygenation via nasal cannula to maintain her oxygenation above 92%, however through the mouth this patient has nasal packing.  Will place patient on aspiration precautions.  Sepsis secondary to aspiration pneumonia Patient does have mild leukocytosis with tachycardia. Treatment and plan as stated above.  Epistaxis, right posterior Patient was seen by Dr. Redmond Baseman in the emergency department. Patient has packing noted in the right nostril. She is a poor surgical candidate.  She was instructed to avoid nose blowing and to spray the pack with saline spray several times daily. Avoid strenuous activity. Followup on Wednesday for pack removal. Currently her hemoglobin is stable. We'll continue to monitor.  Normocytic Anemia Patient's baseline hemoglobin is approximately 9. She's currently at 9.2.  Will continue to monitor as well as continue her ferrous sulfate.  Hypertension Continue  verapamil, Lasix.  Hypokalemia Likely secondary to diuretic use, will replace and continue to monitor her BMP.  Depression Continue Elavil and Paxil.  COPD Patient requires home oxygen at 2-3 L 24 hours daily. Will continue nasal cannula maintain her oxygen saturation above 90%. Patient is currently not on any type of inhalers.  She discusses with her primary care physician.  Tobacco abuse Patient continues to smoke despite her COPD and her oxygen requirements. She was counseled.   DVT prophylaxis: SCDs  Code Status: Full  Condition: Guarded  Family Communication: Son at bedside. Admission, patients condition and plan of care including tests being ordered have been discussed with the patient and son who indicate understanding and agree with the plan and Code Status.  Disposition Plan: Admitted  Time spent: 45 minutes  Tamre Cass D.O. [facility] Pager [phone]  If 7PM-7AM, please contact night-coverage [email or web address] Password [facility] 03/10/2014, 9:06 PM

## 2014-03-10 NOTE — ED Notes (Signed)
Per EMS, picked up from home.  Vitals:  140/80, hr 100, resp 20, 88% ra.  Placed on oxygen in route.

## 2014-03-10 NOTE — ED Notes (Addendum)
Patient with nosebleed which started at 10AM this morning.  She was also seen here last night for same.  EMS was able to stop the flow by spraying Afrin and pinching nostrils.  EMS reported a large amount of bleeding from nostrils.  Unsure of which side was bleeding.  Patient is on O2 at home 2L for COPD.  Patient also c/o nausea so EMS gave her 4mg  Zofran in route.

## 2014-03-10 NOTE — ED Notes (Addendum)
After several attempts to contact family by hospital staff. [facility] was able to get through to daughter to verify that someone was home.

## 2014-03-10 NOTE — ED Provider Notes (Addendum)
CSN: [id number]     Arrival date & time 03/10/14  1455 History   First MD Initiated Contact with Patient 03/10/14 1503     Chief Complaint  Patient presents with  . Epistaxis     (Consider location/radiation/quality/duration/timing/severity/associated sxs/prior Treatment) The history is provided by the patient.  [name] is a 78 y.o. female hx of COPD on home O2, HTN here with nose bleed. She has been having intermittent nosebleeds over the last several days. She is on oxygen for COPD. She tried Afrin and pinching her nose. She came to the ER last night and the bleeding resolved with afrin and INR was nl and she is not on anticoagulation. She came here earlier today for more bleeding and a rhinorocket was placed in right nostril. After she went home, she had another episode of nose bleeding and swallowed and coughed up some blood so came back.    Past Medical History  Diagnosis Date  . COPD (chronic obstructive pulmonary disease)   . Chronic back pain   . Osteoporosis   . On home oxygen therapy 06-14-13    continuos 2.5l/m nasally-24/7  . Hypertension    Past Surgical History  Procedure Laterality Date  . Abdominal hysterectomy    . Sacroplasty  06-14-13    05-10-13 IVR CONE for fracture stabilization  . Esophagogastroduodenoscopy N/A 06/15/2013    Procedure: ESOPHAGOGASTRODUODENOSCOPY (EGD);  Surgeon: [name], MD;  Location: [facility];  Service: Endoscopy;  Laterality: N/A;   History reviewed. No pertinent family history. History  Substance Use Topics  . Smoking status: Former Smoker    Types: Cigarettes    Quit date: 06/15/2011  . Smokeless tobacco: Never Used  . Alcohol Use: No   OB History   Grav Para Term Preterm Abortions TAB SAB Ect Mult Living                 Review of Systems  HENT:       Epistaxis   All other systems reviewed and are negative.      Allergies  Review of patient's allergies indicates no known allergies.  Home  Medications   Current Outpatient Rx  Name  Route  Sig  Dispense  Refill  . amitriptyline (ELAVIL) 10 MG tablet   Oral   Take 10 mg by mouth daily.          Marland Kitchen estrogens, conjugated, (PREMARIN) 1.25 MG tablet   Oral   Take 1.25 mg by mouth every morning.         . ferrous sulfate 325 (65 FE) MG tablet   Oral   Take 325 mg by mouth daily with breakfast.         . furosemide (LASIX) 40 MG tablet   Oral   Take 40 mg by mouth daily.          Marland Kitchen omeprazole (PRILOSEC) 40 MG capsule   Oral   Take 40 mg by mouth every morning.         Marland Kitchen PARoxetine (PAXIL) 20 MG tablet   Oral   Take 20 mg by mouth every morning.         . verapamil (CALAN-SR) 120 MG CR tablet   Oral   Take 120 mg by mouth every morning.          . clindamycin (CLEOCIN) 300 MG capsule   Oral   Take 1 capsule (300 mg total) by mouth 3 (three) times daily. X 7 days  21 capsule   0    BP 150/56  Pulse 107  Temp(Src) 98.5 F (36.9 C) (Oral)  Resp 20  SpO2 88% Physical Exam  Nursing note and vitals reviewed. Constitutional: She is oriented to person, place, and time.  Chronically ill, on oxygen   HENT:  Head: Normocephalic.  Mouth/Throat: Oropharynx is clear and moist.  R rhinorocket in place, soaked with blood. No active bleeding anterior aspect. However, in OP there seem to be some blood and dried clots.   Eyes: Conjunctivae and EOM are normal. Pupils are equal, round, and reactive to light.  Neck: Normal range of motion. Neck supple.  Cardiovascular: Normal rate, regular rhythm and normal heart sounds.   Pulmonary/Chest: Effort normal.  Minimal wheezing throughout, no retractions   Abdominal: Soft. Bowel sounds are normal. She exhibits no distension. There is no tenderness. There is no rebound and no guarding.  Musculoskeletal: Normal range of motion.  Neurological: She is alert and oriented to person, place, and time. No cranial nerve deficit. Coordination normal.  Skin: Skin is warm and  dry.  Psychiatric: She has a normal mood and affect. Her behavior is normal. Judgment and thought content normal.    ED Course  Procedures (including critical care time) Labs Review Labs Reviewed  CBC WITH DIFFERENTIAL - Abnormal; Notable for the following:    WBC 12.5 (*)    RBC 3.47 (*)    Hemoglobin 9.2 (*)    HCT 30.4 (*)    RDW 17.2 (*)    Platelets 418 (*)    Neutrophils Relative % 81 (*)    Neutro Abs 10.1 (*)    All other components within normal limits  BASIC METABOLIC PANEL - Abnormal; Notable for the following:    Potassium 3.6 (*)    Chloride 95 (*)    CO2 33 (*)    Glucose, Bld 167 (*)    GFR calc non Af Amer 85 (*)    All other components within normal limits  TYPE AND SCREEN  ABO/RH   Imaging Review Dg Chest Portable 1 View  03/10/2014   CLINICAL DATA:  Hypoxia  EXAM: PORTABLE CHEST - 1 VIEW  COMPARISON:  May 08, 2008  FINDINGS: There is left base infiltrate. There is underlying emphysema. Elsewhere lungs are clear. Heart is upper normal in size with normal pulmonary vascularity. No adenopathy.  IMPRESSION: Left base infiltrate.  Underlying emphysema.   Electronically Signed   By: Lowella Grip M.D.   On: 03/10/2014 19:56     EKG Interpretation None      MDM   Final diagnoses:  Epistaxis   [name] is a 78 y.o. female here with nose bleed. ? Posterior bleeding and this is her third ED visit since yesterday. CBC this AM was stable. Will call ENT for eval.   7 PM Dr. Redmond Baseman replaced packing. Given multiple attempts at packing, will give clinda for prophylaxis. However, when I tried to discharge patient, she has more bleeding. I called Dr. Redmond Baseman. He states that she is a poor surgical candidate and should be observed on the hospitalist service.   8:54 PM Patient borderline hypoxic. CXR showed new L base infiltrate. I am concerned for possible aspiration. WBC 13, Hg stable. Will admit for aspiration pneumonia. Dr. Redmond Baseman will see in AM.     Wandra Arthurs, MD 03/10/14 Stillmore Yao, MD 03/10/14 [phone]

## 2014-03-10 NOTE — ED Notes (Signed)
Nose continues to ooze slightly

## 2014-03-10 NOTE — Consult Note (Signed)
Reason for Consult:epistaxis Referring Physician: ER  [name] is an 78 y.o. female.  HPI: 78 year old female has been bleeding repetitively from the right nasal passage for the past five days.  Bleeding has been severe.  She came to the ER yesterday and was treated with Afrin and pressure and bleeding stopped.  This morning, bleeding resumed so she returned to the ER and an anterior Rapid Rhino pack was placed.  As soon as she got home, in spite of the pack remaining in place, bleeding resumed and she returned to the ER.  She has worn nasal canula oxygen for several years.  She has never had nosebleeds before.  Past Medical History  Diagnosis Date  . COPD (chronic obstructive pulmonary disease)   . Chronic back pain   . Osteoporosis   . On home oxygen therapy 06-14-13    continuos 2.5l/m nasally-24/7  . Hypertension     Past Surgical History  Procedure Laterality Date  . Abdominal hysterectomy    . Sacroplasty  06-14-13    05-10-13 IVR CONE for fracture stabilization  . Esophagogastroduodenoscopy N/A 06/15/2013    Procedure: ESOPHAGOGASTRODUODENOSCOPY (EGD);  Surgeon: Adriana Ng, MD;  Location: [facility];  Service: Endoscopy;  Laterality: N/A;    History reviewed. No pertinent family history.  Social History:  reports that she quit smoking about 2 years ago. Her smoking use included Cigarettes. She smoked 0.00 packs per day. She has never used smokeless tobacco. She reports that she does not drink alcohol or use illicit drugs.  Allergies: No Known Allergies  Medications: I have reviewed the patient's current medications.  Results for orders placed during the hospital encounter of 03/10/14 (from the past 48 hour(s))  CBC WITH DIFFERENTIAL     Status: Abnormal   Collection Time    03/10/14 11:09 AM      Result Value Ref Range   WBC 8.7  4.0 - 10.5 K/uL   RBC 3.48 (*) 3.87 - 5.11 MIL/uL   Hemoglobin 9.7 (*) 12.0 - 15.0 g/dL   HCT 30.3 (*) 36.0 - 46.0 %   MCV 87.1   78.0 - 100.0 fL   MCH 27.9  26.0 - 34.0 pg   MCHC 32.0  30.0 - 36.0 g/dL   RDW 17.2 (*) 11.5 - 15.5 %   Platelets 407 (*) 150 - 400 K/uL   Neutrophils Relative % 75  43 - 77 %   Neutro Abs 6.5  1.7 - 7.7 K/uL   Lymphocytes Relative 18  12 - 46 %   Lymphs Abs 1.5  0.7 - 4.0 K/uL   Monocytes Relative 5  3 - 12 %   Monocytes Absolute 0.5  0.1 - 1.0 K/uL   Eosinophils Relative 2  0 - 5 %   Eosinophils Absolute 0.2  0.0 - 0.7 K/uL   Basophils Relative 0  0 - 1 %   Basophils Absolute 0.0  0.0 - 0.1 K/uL  BASIC METABOLIC PANEL     Status: Abnormal   Collection Time    03/10/14 11:09 AM      Result Value Ref Range   Sodium 140  137 - 147 mEq/L   Potassium 3.5 (*) 3.7 - 5.3 mEq/L   Chloride 98  96 - 112 mEq/L   CO2 32  19 - 32 mEq/L   Glucose, Bld 114 (*) 70 - 99 mg/dL   BUN 17  6 - 23 mg/dL   Creatinine, Ser 0.51  0.50 -  1.10 mg/dL   Calcium 8.6  8.4 - 10.5 mg/dL   GFR calc non Af Amer >90  >90 mL/min   GFR calc Af Amer >90  >90 mL/min   Comment: (NOTE)     The eGFR has been calculated using the CKD EPI equation.     This calculation has not been validated in all clinical situations.     eGFR's persistently <90 mL/min signify possible Chronic Kidney     Disease.    No results found.  Review of Systems  All other systems reviewed and are negative.   Blood pressure 148/83, pulse 90, temperature 98.5 F (36.9 C), temperature source Oral, resp. rate 17, SpO2 93.00%. Physical Exam  Constitutional: She is oriented to person, place, and time. She appears well-developed and well-nourished. No distress.  HENT:  Head: Normocephalic and atraumatic.  Right Ear: External ear normal.  Left Ear: External ear normal.  Blood coating oropharynx.  Right nasal passage with Rapid Rhino pack in place, no active bleeding.  Eyes: Conjunctivae and EOM are normal. Pupils are equal, round, and reactive to light.  Neck: Normal range of motion. Neck supple.  Cardiovascular: Normal rate.    Respiratory: Effort normal.  Musculoskeletal: Normal range of motion.  Neurological: She is alert and oriented to person, place, and time. No cranial nerve deficit.  Skin: Skin is warm and dry.  Psychiatric: She has a normal mood and affect. Her behavior is normal. Judgment and thought content normal.    Assessment/Plan: Right posterior epistaxis The right anterior pack was removed and exchanged for an anterior/posterior pack with good control of bleeding.  She was instructed to avoid nose blowing and to spray the pack with saline spray several times per day.  She was told to avoid strenuous activity.  Follow-up Wednesday for pack removal.  [name] 03/10/2014, 5:12 PM

## 2014-03-10 NOTE — ED Provider Notes (Signed)
CSN: [id number]     Arrival date & time 03/10/14  0035 History   First MD Initiated Contact with Patient 03/10/14 0122     No chief complaint on file.    (Consider location/radiation/quality/duration/timing/severity/associated sxs/prior Treatment) HPI 78 yo female presents to the ER from home with complaint of nosebleed.  Pt reports over the last week she has had several nosebleeds, all controlled with holding pressure.  Pt has h/o COPD, on 2.5 liters chronically.  Bleeding has been mainly from the right nostril.  Pt seen tonight by EMS, given afrin with initial control of nosebleed, but then recurred with clots.  Pt had been humidified o2 in the past, but not currently.  Nosebleed has since resolved. Past Medical History  Diagnosis Date  . COPD (chronic obstructive pulmonary disease)   . Chronic back pain   . Osteoporosis   . On home oxygen therapy 06-14-13    continuos 2.5l/m nasally-24/7  . Hypertension    Past Surgical History  Procedure Laterality Date  . Abdominal hysterectomy    . Sacroplasty  06-14-13    05-10-13 IVR CONE for fracture stabilization  . Esophagogastroduodenoscopy N/A 06/15/2013    Procedure: ESOPHAGOGASTRODUODENOSCOPY (EGD);  Surgeon: [name], MD;  Location: [facility];  Service: Endoscopy;  Laterality: N/A;   History reviewed. No pertinent family history. History  Substance Use Topics  . Smoking status: Former Smoker    Types: Cigarettes    Quit date: 06/15/2011  . Smokeless tobacco: Never Used  . Alcohol Use: No   OB History   Grav Para Term Preterm Abortions TAB SAB Ect Mult Living                 Review of Systems  All other systems reviewed and are negative.      Allergies  Review of patient's allergies indicates no known allergies.  Home Medications   Current Outpatient Rx  Name  Route  Sig  Dispense  Refill  . amitriptyline (ELAVIL) 10 MG tablet   Oral   Take 10 mg by mouth at bedtime.          Marland Kitchen estrogens,  conjugated, (PREMARIN) 1.25 MG tablet   Oral   Take 1.25 mg by mouth every morning.         . ferrous sulfate 325 (65 FE) MG tablet   Oral   Take 325 mg by mouth daily with breakfast.         . furosemide (LASIX) 40 MG tablet   Oral   Take 40 mg by mouth at bedtime.         Marland Kitchen omeprazole (PRILOSEC) 40 MG capsule   Oral   Take 40 mg by mouth every morning.         Marland Kitchen PARoxetine (PAXIL) 20 MG tablet   Oral   Take 20 mg by mouth every morning.         . verapamil (CALAN-SR) 120 MG CR tablet   Oral   Take 120 mg by mouth every morning.           BP 132/60  Pulse 86  Temp(Src) 97.8 F (36.6 C) (Oral)  Resp 15  SpO2 98% Physical Exam  Nursing note and vitals reviewed. Constitutional: She is oriented to person, place, and time. She appears well-developed and well-nourished. No distress.  HENT:  Head: Normocephalic and atraumatic.  Right Ear: External ear normal.  Left Ear: External ear normal.  Mouth/Throat: Oropharynx is clear and moist.  Dried  blood noted in both nares.  Small area of excoriation to right anterior septum, no active bleeding.  Eyes: Conjunctivae and EOM are normal. Pupils are equal, round, and reactive to light.  Neck: Normal range of motion. Neck supple. No JVD present. No tracheal deviation present. No thyromegaly present.  Cardiovascular: Normal rate, regular rhythm, normal heart sounds and intact distal pulses.  Exam reveals no gallop and no friction rub.   No murmur heard. Pulmonary/Chest: Effort normal. No stridor. No respiratory distress. She has wheezes (mild end expiratory wheeze). She has no rales. She exhibits no tenderness.  Abdominal: Soft. Bowel sounds are normal. She exhibits no distension and no mass. There is no tenderness. There is no rebound and no guarding.  Musculoskeletal: Normal range of motion. She exhibits no edema and no tenderness.  Lymphadenopathy:    She has no cervical adenopathy.  Neurological: She is alert and  oriented to person, place, and time. She has normal reflexes. No cranial nerve deficit. She exhibits normal muscle tone. Coordination normal.  Skin: Skin is warm and dry. No rash noted. No erythema. No pallor.  Psychiatric: She has a normal mood and affect. Her behavior is normal. Judgment and thought content normal.    ED Course  Procedures (including critical care time) Labs Review Labs Reviewed  CBC - Abnormal; Notable for the following:    WBC 11.9 (*)    RBC 3.53 (*)    Hemoglobin 9.3 (*)    HCT 30.8 (*)    RDW 16.9 (*)    Platelets 414 (*)    All other components within normal limits  PROTIME-INR   Imaging Review No results found.   EKG Interpretation None      MDM   Final diagnoses:  Nosebleed    78 yo female on chronic 02 with nose bleed, not on blood thinners.  Bleeding controlled here.  No severe anemia.  Will place bactroban in nose, send home with rx for humidified 02.  F/u with ent prn.    Kalman Drape, MD 03/11/14 [phone]

## 2014-03-11 LAB — CBC
HCT: 29.5 % — ABNORMAL LOW (ref 36.0–46.0)
Hemoglobin: 9 g/dL — ABNORMAL LOW (ref 12.0–15.0)
MCH: 26.8 pg (ref 26.0–34.0)
MCHC: 30.5 g/dL (ref 30.0–36.0)
MCV: 87.8 fL (ref 78.0–100.0)
PLATELETS: 429 10*3/uL — AB (ref 150–400)
RBC: 3.36 MIL/uL — AB (ref 3.87–5.11)
RDW: 17.5 % — ABNORMAL HIGH (ref 11.5–15.5)
WBC: 11 10*3/uL — ABNORMAL HIGH (ref 4.0–10.5)

## 2014-03-11 LAB — BASIC METABOLIC PANEL
BUN: 17 mg/dL (ref 6–23)
CALCIUM: 8.8 mg/dL (ref 8.4–10.5)
CO2: 31 mEq/L (ref 19–32)
Chloride: 99 mEq/L (ref 96–112)
Creatinine, Ser: 0.52 mg/dL (ref 0.50–1.10)
GFR calc Af Amer: >90 mL/min (ref >90)
GLUCOSE: 110 mg/dL — AB (ref 70–99)
Potassium: 3.9 mEq/L (ref 3.7–5.3)
Sodium: 141 mEq/L (ref 137–147)

## 2014-03-11 MED ORDER — IPRATROPIUM-ALBUTEROL 0.5-2.5 (3) MG/3ML IN SOLN
3.0000 mL | RESPIRATORY_TRACT | Status: DC
  Administered 2014-03-11 (×3): 3 mL via RESPIRATORY_TRACT
  Filled 2014-03-11 (×3): qty 3

## 2014-03-11 MED ORDER — IPRATROPIUM-ALBUTEROL 0.5-2.5 (3) MG/3ML IN SOLN: 3.0000 mL | Freq: Four times a day (QID) | RESPIRATORY_TRACT | Status: DC | PRN

## 2014-03-11 MED ORDER — SALINE SPRAY 0.65 % NA SOLN
2.0000 | NASAL | Status: DC
  Administered 2014-03-11 – 2014-03-13 (×16): 2 via NASAL
  Filled 2014-03-11: qty 44

## 2014-03-11 MED ORDER — IPRATROPIUM-ALBUTEROL 0.5-2.5 (3) MG/3ML IN SOLN
3.0000 mL | Freq: Three times a day (TID) | RESPIRATORY_TRACT | Status: DC
  Administered 2014-03-12 (×2): 3 mL via RESPIRATORY_TRACT
  Filled 2014-03-11 (×3): qty 3

## 2014-03-11 NOTE — Progress Notes (Signed)
  Subjective: Doing well this morning.  Did not sleep much.  No active bleeding, only mild bloody drainage from pack.  Objective: Vital signs in last 24 hours: Temp:  [97.4 F (36.3 C)-98.5 F (36.9 C)] 97.4 F (36.3 C) (03/14 2214) Pulse Rate:  [90-108] 101 (03/14 2214) Resp:  [17-24] 20 (03/14 2214) BP: (117-153)/(56-106) 144/75 mmHg (03/14 2214) SpO2:  [88 %-97 %] 97 % (03/14 2214) Weight:  [64.1 kg (141 lb 5 oz)] 64.1 kg (141 lb 5 oz) (03/14 2214)    Intake/Output from previous day:   Intake/Output this shift:    General appearance: alert, cooperative and no distress Nose: Right nasal passage packed, no active bleeding.  Lab Results:   Recent Labs  03/10/14 1109 03/10/14 2010  WBC 8.7 12.5*  HGB 9.7* 9.2*  HCT 30.3* 30.4*  PLT 407* 418*   BMET  Recent Labs  03/10/14 1109 03/10/14 2010  NA 140 139  K 3.5* 3.6*  CL 98 95*  CO2 32 33*  GLUCOSE 114* 167*  BUN 17 17  CREATININE 0.51 0.64  CALCIUM 8.6 9.1   PT/INR  Recent Labs  03/10/14 0148  LABPROT 12.0  INR 0.90   ABG No results found for this basename: PHART, PCO2, PO2, HCO3,  in the last 72 hours  Studies/Results: Dg Chest Portable 1 View  03/10/2014   CLINICAL DATA:  Hypoxia  EXAM: PORTABLE CHEST - 1 VIEW  COMPARISON:  May 08, 2008  FINDINGS: There is left base infiltrate. There is underlying emphysema. Elsewhere lungs are clear. Heart is upper normal in size with normal pulmonary vascularity. No adenopathy.  IMPRESSION: Left base infiltrate.  Underlying emphysema.   Electronically Signed   By: Lowella Grip M.D.   On: 03/10/2014 19:56    Anti-infectives: Anti-infectives   Start     Dose/Rate Route Frequency Ordered Stop   03/11/14 0500  piperacillin-tazobactam (ZOSYN) IVPB 3.375 g     3.375 g 12.5 mL/hr over 240 Minutes Intravenous Every 8 hours 03/10/14 2335     03/10/14 2030  piperacillin-tazobactam (ZOSYN) IVPB 3.375 g     3.375 g 100 mL/hr over 30 Minutes Intravenous  Once  03/10/14 2024 03/10/14 2122   03/10/14 1800  cephALEXin (KEFLEX) capsule 500 mg  Status:  Discontinued     500 mg Oral  Once 03/10/14 1758 03/10/14 1758   03/10/14 1800  clindamycin (CLEOCIN) capsule 300 mg     300 mg Oral  Once 03/10/14 1758 03/10/14 1827   03/10/14 0000  clindamycin (CLEOCIN) 300 MG capsule     300 mg Oral 3 times daily 03/10/14 1851        Assessment/Plan: Right posterior epistaxis s/p packing Plan to keep pack in place until Wednesday.  No active bleeding since pack placement.  Add saline to pack several times per day.  Change drip pad as needed.  LOS: 1 day    [name] 03/11/2014

## 2014-03-11 NOTE — Progress Notes (Signed)
[facility] PROGRESS NOTE  [name] SWF:[id number] DOB: 1936/01/01 DOA: 03/10/2014 PCP: Adriana Downing, MD  Assessment/Plan  Acute respiratory failure with hypoxia and sepsis secondary to aspiration pneumonia, HR and WBC trending down -  Continue oxygen via nasal canula in mouth or mask, whichever patient prefers -  Continue IV zosyn  Epistaxis, right posterior, still having bright red blood draining down posterior OP and slow gtt from right nostril -  Appreciate Dr. Redmond [name]' assistance -  Hemoglobin drifting down -  zofran as needed for nausea -  Continue aspiration precautions  COPD  -  Patient requires home oxygen at 2-3 L 24 hours daily.  - start duonebs -  Consider addition of steroids if breathing declines  Iron deficiency and acute blood loss anemia -  Patient's baseline hemoglobin is approximately 9. She's currently at 9.  -  continue her ferrous sulfate -  Repeat H&H in AM  Hypertension BP elevated Continue verapamil, Lasix.   Depression  Continue Elavil and Paxil.   Tobacco abuse  Patient continues to smoke despite her COPD and her oxygen requirements. She was counseled.  Hypokalemia likely secondary to diuretic use, resolved with supplementation  Leukocytosis due to aspiration pneumonia, trending down  Thrombocytosis, acute phase reactant, rising  -  Repeat CBC in AM  Diet:  Healthy heart Access:  PIV IVF:  off Proph:  scds  Code Status: full Family Communication: patient alone Disposition Plan: pending improvement in breathing and bleeding slowing   Consultants:  Dr. Redmond [name], ENT  Procedures:  Packing right nostril  Antibiotics:  Zosyn 3/14 >>  HPI/Subjective:  Patient states she feels very fatigued and tired.  Initially denies SOB, then endorses.  Denies cough.  Blood still draining from right nostril.    Objective: Filed Vitals:   03/10/14 2100 03/10/14 2139 03/10/14 2214 03/11/14 0705  BP: 117/90 149/74 144/75 149/75   Pulse: 104 108 101 93  Temp:  98.2 F (36.8 C) 97.4 F (36.3 C) 97.3 F (36.3 C)  TempSrc:  Oral Oral Oral  Resp: 24 24 20 20   Height:   5' (1.524 m)   Weight:   64.1 kg (141 lb 5 oz)   SpO2: 97% 88% 97% 90%    Intake/Output Summary (Last 24 hours) at 03/11/14 0948 Last data filed at 03/11/14 2202  Gross per 24 hour  Intake    360 ml  Output    300 ml  Net     60 ml   Filed Weights   03/10/14 2214  Weight: 64.1 kg (141 lb 5 oz)    Exam:   General:  Thin CF, No acute distress  HEENT:  NCAT, MMM, right nare packing soaked with blood and small amount of dressing taped under nose.  Posterior OP with blood draining down and blood in mouth  Cardiovascular:  RRR, nl S1, S2 no mrg, 2+ pulses, warm extremities  Respiratory:  Diminished bilateral BS, faint wheeze, no obvious rales or rhonchi, no increased WOB  Abdomen:   NABS, soft, NT/ND  MSK:   Normal tone and bulk, no LEE  Neuro:  Grossly intact  Data Reviewed: Basic Metabolic Panel:  Recent Labs Lab 03/10/14 1109 03/10/14 2010 03/11/14 0513  NA 140 139 141  K 3.5* 3.6* 3.9  CL 98 95* 99  CO2 32 33* 31  GLUCOSE 114* 167* 110*  BUN 17 17 17   CREATININE 0.51 0.64 0.52  CALCIUM 8.6 9.1 8.8   Liver Function Tests: No results found for  this basename: AST, ALT, ALKPHOS, BILITOT, PROT, ALBUMIN,  in the last 168 hours No results found for this basename: LIPASE, AMYLASE,  in the last 168 hours No results found for this basename: AMMONIA,  in the last 168 hours CBC:  Recent Labs Lab 03/10/14 0148 03/10/14 1109 03/10/14 2010 03/11/14 0513  WBC 11.9* 8.7 12.5* 11.0*  NEUTROABS  --  6.5 10.1*  --   HGB 9.3* 9.7* 9.2* 9.0*  HCT 30.8* 30.3* 30.4* 29.5*  MCV 87.3 87.1 87.6 87.8  PLT 414* 407* 418* 429*   Cardiac Enzymes: No results found for this basename: CKTOTAL, CKMB, CKMBINDEX, TROPONINI,  in the last 168 hours BNP (last 3 results) No results found for this basename: PROBNP,  in the last 8760  hours CBG: No results found for this basename: GLUCAP,  in the last 168 hours  No results found for this or any previous visit (from the past 240 hour(s)).   Studies: Dg Chest Portable 1 View  03/10/2014   CLINICAL DATA:  Hypoxia  EXAM: PORTABLE CHEST - 1 VIEW  COMPARISON:  May 08, 2008  FINDINGS: There is left base infiltrate. There is underlying emphysema. Elsewhere lungs are clear. Heart is upper normal in size with normal pulmonary vascularity. No adenopathy.  IMPRESSION: Left base infiltrate.  Underlying emphysema.   Electronically Signed   By: Lowella Grip M.D.   On: 03/10/2014 19:56    Scheduled Meds: . sodium chloride   Intravenous STAT  . amitriptyline  10 mg Oral QHS  . estrogens (conjugated)  1.25 mg Oral q morning - 10a  . ferrous sulfate  325 mg Oral QPC breakfast  . furosemide  40 mg Oral Daily  . pantoprazole  40 mg Oral Daily  . PARoxetine  20 mg Oral q morning - 10a  . piperacillin-tazobactam (ZOSYN)  IV  3.375 g Intravenous Q8H  . sodium chloride  2 spray Each Nare Q2H while awake  . sodium chloride  3 mL Intravenous Q12H  . verapamil  120 mg Oral q morning - 10a   Continuous Infusions:   Principal Problem:   Acute respiratory failure with hypoxia Active Problems:   Anemia   Aspiration pneumonia   Epistaxis   Sepsis   Hypokalemia   Hypertension   COPD (chronic obstructive pulmonary disease)   Tobacco abuse    Time spent: 30 min    [name], [facility] Pager [phone]. If 7PM-7AM, please contact night-coverage at [email or web address], password [facility] 03/11/2014, 9:48 AM  LOS: 1 day

## 2014-03-11 NOTE — Progress Notes (Signed)
UR completed 

## 2014-03-12 LAB — CBC
HEMATOCRIT: 32.9 % — AB (ref 36.0–46.0)
HEMOGLOBIN: 9.5 g/dL — AB (ref 12.0–15.0)
MCH: 25.8 pg — ABNORMAL LOW (ref 26.0–34.0)
MCHC: 28.9 g/dL — ABNORMAL LOW (ref 30.0–36.0)
MCV: 89.4 fL (ref 78.0–100.0)
Platelets: 432 10*3/uL — ABNORMAL HIGH (ref 150–400)
RBC: 3.68 MIL/uL — ABNORMAL LOW (ref 3.87–5.11)
RDW: 17.6 % — ABNORMAL HIGH (ref 11.5–15.5)
WBC: 11 10*3/uL — ABNORMAL HIGH (ref 4.0–10.5)

## 2014-03-12 LAB — BASIC METABOLIC PANEL
BUN: 14 mg/dL (ref 6–23)
CO2: 36 mEq/L — ABNORMAL HIGH (ref 19–32)
CREATININE: 0.63 mg/dL (ref 0.50–1.10)
Calcium: 9.1 mg/dL (ref 8.4–10.5)
Chloride: 95 mEq/L — ABNORMAL LOW (ref 96–112)
GFR calc Af Amer: >90 mL/min (ref >90)
GFR, EST NON AFRICAN AMERICAN: 86 mL/min — AB (ref >90)
GLUCOSE: 112 mg/dL — AB (ref 70–99)
POTASSIUM: 4 meq/L (ref 3.7–5.3)
Sodium: 143 mEq/L (ref 137–147)

## 2014-03-12 MED ORDER — ONDANSETRON HCL 4 MG/2ML IJ SOLN
4.0000 mg | Freq: Four times a day (QID) | INTRAMUSCULAR | Status: DC | PRN
  Administered 2014-03-12 – 2014-03-13 (×2): 4 mg via INTRAVENOUS
  Filled 2014-03-12 (×2): qty 2

## 2014-03-12 NOTE — Evaluation (Signed)
Physical Therapy Evaluation Patient Details Name: [name] MRN: [id number] DOB: Oct 01, 1936 Today's Date: 03/12/2014 Time: 5188-4166 PT Time Calculation (min): 16 min  PT Assessment / Plan / Recommendation History of Present Illness  78 yo female admitted with nose bleed, acute resp failure.   Clinical Impression  On eval, pt required Min assist for mobility-able to ambulate ~100 feet with 1 HHA-very unsteady and at risk for falls. Recommend RW use, HHPT, 24/7 supervision/assist at discharge.     PT Assessment  Patient needs continued PT services    Follow Up Recommendations  Home health PT;Supervision/Assistance - 24 hour    Does the patient have the potential to tolerate intense rehabilitation      Barriers to Discharge        Equipment Recommendations  None recommended by PT (pt states she has a walker)    Recommendations for Other Services OT consult   Frequency Min 3X/week    Precautions / Restrictions Precautions Precautions: Fall Precaution Comments: nose bleed, monitor sats Restrictions Weight Bearing Restrictions: No   Pertinent Vitals/Pain Chronic back pain-unrated      Mobility  Bed Mobility Overal bed mobility: Modified Independent Transfers Overall transfer level: Needs assistance Transfers: Sit to/from Stand Sit to Stand: Min guard General transfer comment: close guard for safety. unsteady Ambulation/Gait Ambulation/Gait assistance: Min assist Ambulation Distance (Feet): 100 Feet Assistive device: 1 person hand held assist Gait Pattern/deviations: Decreased stride length;Staggering left;Staggering right;Scissoring;Step-through pattern General Gait Details: gait very unsteady. LOB multiple times during walk requiring external assist to prevent fall. Remained on O2 throughout ambulation (pt is O2 dependent at baseline)    Exercises     PT Diagnosis: Difficulty walking;Abnormality of gait;Generalized weakness  PT Problem List: Decreased  strength;Decreased activity tolerance;Decreased balance;Decreased mobility;Decreased knowledge of use of DME;Pain PT Treatment Interventions: DME instruction;Gait training;Functional mobility training;Therapeutic activities;Therapeutic exercise;Patient/family education;Balance training     PT Goals(Current goals can be found in the care plan section) Acute Rehab PT Goals Patient Stated Goal: for nose bleeds to stop; regain strength PT Goal Formulation: With patient Time For Goal Achievement: 03/26/14 Potential to Achieve Goals: Good  Visit Information  Last PT Received On: 03/12/14 Assistance Needed: +1 History of Present Illness: 78 yo female admitted with nose bleed, acute resp failure.        Prior [place] expects to be discharged to:: Private residence Living Arrangements: Children (son) Type of Home: House Home Access: Stairs to enter Technical brewer of Steps: 2 Entrance Stairs-Rails: Right Home Layout: One level Home Equipment: Environmental consultant - 2 wheels Prior Function Level of Independence: Needs assistance ADL's / Homemaking Assistance Needed: son assists with meals, transportation Communication Communication: No difficulties    Cognition  Cognition Arousal/Alertness: Awake/alert Behavior During Therapy: WFL for tasks assessed/performed Overall Cognitive Status: Within Functional Limits for tasks assessed    Extremity/Trunk Assessment Upper Extremity Assessment Upper Extremity Assessment: Generalized weakness Lower Extremity Assessment Lower Extremity Assessment: Generalized weakness Cervical / Trunk Assessment Cervical / Trunk Assessment: Normal   Balance    End of Session PT - End of Session Equipment Utilized During Treatment: Oxygen Activity Tolerance: Patient limited by fatigue Patient left: in bed;with call bell/phone within reach  GP     Weston Anna, MPT Pager: [phone]

## 2014-03-12 NOTE — Progress Notes (Signed)
Discussed with patient the importance of wearing SCD's but she states she feels she is active enough and does not want to wear at this time.

## 2014-03-12 NOTE — Progress Notes (Signed)
[facility] PROGRESS NOTE  [name]:[id number] DOB: December 06, 1936 DOA: 03/10/2014 PCP: [name], MD  Assessment/Plan  Acute respiratory failure with hypoxia and sepsis secondary to aspiration pneumonia, still on 6L O2 -  Continue oxygen via mask -  Continue IV zosyn day 3 -  Wean oxygen as tolerated  Epistaxis, right posterior, still having bright red blood draining down posterior OP and slow gtt from right nostril -  Appreciate Dr. Redmond Baseman' assistance -  zofran as needed for nausea -  Continue aspiration precautions  COPD  -  Patient requires home oxygen at 2-3 L 24 hours daily.  -  Continue duonebs -  Consider addition of steroids if breathing declines  Iron deficiency and acute blood loss anemia -  Patient's baseline hemoglobin is approximately 9. She's currently at 9.  -  continue her ferrous sulfate  Hypertension BP elevated Continue verapamil, Lasix.   Depression  Continue Elavil and Paxil.   Tobacco abuse  Patient continues to smoke despite her COPD and her oxygen requirements. She was counseled.  Hypokalemia likely secondary to diuretic use, resolved with supplementation  Leukocytosis due to aspiration pneumonia, trending down  Thrombocytosis, acute phase reactant, stable -  Will need outpatient follow up with PCP and possible oncology if persists for concern for essential thrombocytosis  Diet:  Healthy heart Access:  PIV IVF:  off Proph:  scds  Code Status: full Family Communication: patient alone Disposition Plan: pending improvement in breathing and bleeding slowing, PT/OT    Consultants:  Dr. Redmond Baseman, ENT  Procedures:  Packing right nostril  Antibiotics:  Zosyn 3/14 >>  HPI/Subjective:  Patient denies SOB and cough.  Blood still draining from right nostril.    Objective: Filed Vitals:   03/11/14 1455 03/11/14 1635 03/11/14 2123 03/12/14 0451  BP: 126/66  151/80 134/72  Pulse: 77  103 87  Temp: 97.8 F (36.6 C)   98 F (36.7 C) 97.8 F (36.6 C)  TempSrc: Oral  Oral Oral  Resp: 20  20 18   Height:      Weight:      SpO2: 96% 94% 93% 95%    Intake/Output Summary (Last 24 hours) at 03/12/14 0855 Last data filed at 03/11/14 1700  Gross per 24 hour  Intake    240 ml  Output    400 ml  Net   -160 ml   Filed Weights   03/10/14 2214  Weight: 64.1 kg (141 lb 5 oz)    Exam:   General:  Thin CF, No acute distress  HEENT:  NCAT, MMM, right nare packing soaked with blood and small amount of dressing taped under nose.  Posterior OP with blood draining down and blood in mouth  Cardiovascular:  RRR, nl S1, S2 no mrg, 2+ pulses, warm extremities  Respiratory:   Bilateral lower lobe rales and rhonchi, Diminished bilateral BS, no obvious wheeze, no increased WOB  Abdomen:   NABS, soft, NT/ND  MSK:   Normal tone and bulk, no LEE  Neuro:  Grossly intact  Data Reviewed: Basic Metabolic Panel:  Recent Labs Lab 03/10/14 1109 03/10/14 2010 03/11/14 0513 03/12/14 0515  NA 140 139 141 143  K 3.5* 3.6* 3.9 4.0  CL 98 95* 99 95*  CO2 32 33* 31 36*  GLUCOSE 114* 167* 110* 112*  BUN 17 17 17 14   CREATININE 0.51 0.64 0.52 0.63  CALCIUM 8.6 9.1 8.8 9.1   Liver Function Tests: No results found for this basename: AST, ALT, ALKPHOS,  BILITOT, PROT, ALBUMIN,  in the last 168 hours No results found for this basename: LIPASE, AMYLASE,  in the last 168 hours No results found for this basename: AMMONIA,  in the last 168 hours CBC:  Recent Labs Lab 03/10/14 0148 03/10/14 1109 03/10/14 2010 03/11/14 0513 03/12/14 0515  WBC 11.9* 8.7 12.5* 11.0* 11.0*  NEUTROABS  --  6.5 10.1*  --   --   HGB 9.3* 9.7* 9.2* 9.0* 9.5*  HCT 30.8* 30.3* 30.4* 29.5* 32.9*  MCV 87.3 87.1 87.6 87.8 89.4  PLT 414* 407* 418* 429* 432*   Cardiac Enzymes: No results found for this basename: CKTOTAL, CKMB, CKMBINDEX, TROPONINI,  in the last 168 hours BNP (last 3 results) No results found for this basename: PROBNP,  in  the last 8760 hours CBG: No results found for this basename: GLUCAP,  in the last 168 hours  No results found for this or any previous visit (from the past 240 hour(s)).   Studies: Dg Chest Portable 1 View  03/10/2014   CLINICAL DATA:  Hypoxia  EXAM: PORTABLE CHEST - 1 VIEW  COMPARISON:  May 08, 2008  FINDINGS: There is left base infiltrate. There is underlying emphysema. Elsewhere lungs are clear. Heart is upper normal in size with normal pulmonary vascularity. No adenopathy.  IMPRESSION: Left base infiltrate.  Underlying emphysema.   Electronically Signed   By: Lowella Grip M.D.   On: 03/10/2014 19:56    Scheduled Meds: . amitriptyline  10 mg Oral QHS  . estrogens (conjugated)  1.25 mg Oral q morning - 10a  . ferrous sulfate  325 mg Oral QPC breakfast  . furosemide  40 mg Oral Daily  . ipratropium-albuterol  3 mL Nebulization TID  . pantoprazole  40 mg Oral Daily  . PARoxetine  20 mg Oral q morning - 10a  . piperacillin-tazobactam (ZOSYN)  IV  3.375 g Intravenous Q8H  . sodium chloride  2 spray Each Nare Q2H while awake  . sodium chloride  3 mL Intravenous Q12H  . verapamil  120 mg Oral q morning - 10a   Continuous Infusions:   Principal Problem:   Acute respiratory failure with hypoxia Active Problems:   Anemia   Aspiration pneumonia   Epistaxis   Sepsis   Hypokalemia   Hypertension   COPD (chronic obstructive pulmonary disease)   Tobacco abuse    Time spent: 30 min    Gregary Blackard, [facility] Pager [phone]. If 7PM-7AM, please contact night-coverage at [email or web address], password [email or web address] 03/12/2014, 8:55 AM  LOS: 2 days

## 2014-03-13 LAB — BASIC METABOLIC PANEL
BUN: 16 mg/dL (ref 6–23)
CALCIUM: 8.4 mg/dL (ref 8.4–10.5)
CO2: 32 mEq/L (ref 19–32)
Chloride: 94 mEq/L — ABNORMAL LOW (ref 96–112)
Creatinine, Ser: 0.69 mg/dL (ref 0.50–1.10)
GFR calc Af Amer: >90 mL/min (ref >90)
GFR, EST NON AFRICAN AMERICAN: 83 mL/min — AB (ref >90)
Glucose, Bld: 141 mg/dL — ABNORMAL HIGH (ref 70–99)
Potassium: 3.9 mEq/L (ref 3.7–5.3)
Sodium: 138 mEq/L (ref 137–147)

## 2014-03-13 LAB — CBC
HEMATOCRIT: 34 % — AB (ref 36.0–46.0)
HEMOGLOBIN: 10.1 g/dL — AB (ref 12.0–15.0)
MCH: 26.2 pg (ref 26.0–34.0)
MCHC: 29.7 g/dL — AB (ref 30.0–36.0)
MCV: 88.3 fL (ref 78.0–100.0)
Platelets: 458 10*3/uL — ABNORMAL HIGH (ref 150–400)
RBC: 3.85 MIL/uL — ABNORMAL LOW (ref 3.87–5.11)
RDW: 17.8 % — ABNORMAL HIGH (ref 11.5–15.5)
WBC: 10.1 10*3/uL (ref 4.0–10.5)

## 2014-03-13 MED ORDER — SALINE SPRAY 0.65 % NA SOLN: 2.0000 | NASAL | Status: DC

## 2014-03-13 MED ORDER — AMOXICILLIN-POT CLAVULANATE 875-125 MG PO TABS
1.0000 | ORAL_TABLET | Freq: Two times a day (BID) | ORAL | Status: DC
  Filled 2014-03-13 (×2): qty 1

## 2014-03-13 MED ORDER — SACCHAROMYCES BOULARDII 250 MG PO CAPS: 250.0000 mg | ORAL_CAPSULE | Freq: Two times a day (BID) | ORAL | Status: DC

## 2014-03-13 MED ORDER — CLINDAMYCIN HCL 300 MG PO CAPS
300.0000 mg | ORAL_CAPSULE | Freq: Three times a day (TID) | ORAL | Status: DC
  Administered 2014-03-13: 300 mg via ORAL
  Filled 2014-03-13 (×5): qty 1

## 2014-03-13 NOTE — Discharge Summary (Signed)
Physician Discharge Summary  [name] [id number] DOB: Jun 17, 1936 DOA: 03/10/2014  PCP: [name], MD  Admit date: 03/10/2014 Discharge date: 03/13/2014  Recommendations for Outpatient Follow-up:  1. Follow up with Dr. Redmond Baseman at already scheduled appointment tomorrow 2. F/u with primary care doctor within 1 week for follow up of aspiration pneumonia.  Given face mask to use for oxygen at home.  Recommend 24 hour assistance.  Home health PT and tub bench.   3. Clindamycin at discharge   Discharge Diagnoses:  Principal Problem:   Acute respiratory failure with hypoxia Active Problems:   Anemia   Aspiration pneumonia   Epistaxis   Sepsis   Hypokalemia   Hypertension   COPD (chronic obstructive pulmonary disease)   Tobacco abuse   Discharge Condition: stable, improved  Diet recommendation: healthy heart  Wt Readings from Last 3 Encounters:  03/10/14 64.1 kg (141 lb 5 oz)  05/11/13 54.432 kg (120 lb)    History of present illness:  ] [name] is a 78 y.o. female  With a history of COPD requiring 2 L of nasal cannula at home 24 hours, hypertension, that presents emergency department for nose bleeds. Patient has been having intermittent nosebleeds over the last several days. She's come to the emergency department twice. She came last night, she was placed on aspirin and was sent home as her bleeding had resolved. Patient is not on any anticoagulation. She presented again today with more bleeding. Patient was seen in the emergency department by Dr. Redmond Baseman, ENT, and Rhino Rocket packing was placed in the right nostril. Patient was then sent home afterwards and she returned for continued nosebleeds and swallowed blood. Chest x-ray in the emergency department did show left base infiltrate with underlying emphysema. Patient was found to be hypoxic on her usual amount of oxygen via nasal cannula. At this time she denies any shortness of breath more than usual, chest pain,  dizziness, abdominal pain. Patient denies any sick contacts or recent travel. She denies any trauma to her nose.   Hospital Course:   Acute respiratory failure with hypoxia and sepsis secondary to aspiration pneumonia, transitioned to venturi mask because she was not able to breath through her nose.  She completed 4 days of zosyn and was transitioned to clindamycin at discharge to complete a 10-day course.   Epistaxis, right posterior, still having bright red blood draining down posterior OP and slow gtt from right nostril.  She will follow up with Dr. Redmond Baseman in one day for reevaluation and possible cautery.    COPD, stable.  Patient requires home oxygen at 2-3 L 24 hours daily, although she suggests that she is not always compliant with her oxgyen at home.  She continued duonebs and did not require systemic steroids.    Iron deficiency and acute blood loss anemia, hemoglobin decreased to 9mg /dl range and remained stable.  She continued iron supplementation and did not require blood transfusion.      Hypertension BP elevated.  Continued verapamil, Lasix.   Depression, Continued Elavil and Paxil.   Tobacco abuse, Patient continues to smoke despite her COPD and her oxygen requirements. She was counseled to stop smoking. Hypokalemia likely secondary to diuretic use, resolved with supplementation  Leukocytosis due to aspiration pneumonia, resolved with abx Thrombocytosis, acute phase reactant, stable.  Will need outpatient follow up with PCP and possible oncology if this  persists for concern for essential thrombocytosis   Consultants:  Dr. Redmond Baseman, ENT Procedures:  Packing right nostril  Antibiotics:  Zosyn 3/14 >>3/17 Augmentin 3/17 >> 3/20   Discharge Exam: Filed Vitals:   03/13/14 0937  BP: 122/56  Pulse:   Temp:   Resp:    Filed Vitals:   03/12/14 1407 03/12/14 2114 03/13/14 0558 03/13/14 0937  BP: 105/50 144/80 118/65 122/56  Pulse: 86 108 100   Temp: 97.4 F (36.3 C) 98 F  (36.7 C) 98.2 F (36.8 C)   TempSrc: Oral Oral Oral   Resp: 24 22 16    Height:      Weight:      SpO2: 96% 96% 95%     General: Thin CF, No acute distress  HEENT: NCAT, MMM, dressing taped under nose clean and intact. Posterior OP with blood draining down Cardiovascular: RRR, nl S1, S2 no mrg, 2+ pulses, warm extremities  Respiratory: Bilateral lower lobe rales and rhonchi, Diminished bilateral BS, no obvious wheeze, no increased WOB  Abdomen: NABS, soft, NT/ND  MSK: Normal tone and bulk, no LEE  Neuro: Grossly intact   Discharge Instructions      Discharge Orders   Future Orders Complete By Expires   Call MD for:  difficulty breathing, headache or visual disturbances  As directed    Call MD for:  extreme fatigue  As directed    Call MD for:  hives  As directed    Call MD for:  persistant dizziness or light-headedness  As directed    Call MD for:  persistant nausea and vomiting  As directed    Call MD for:  severe uncontrolled pain  As directed    Call MD for:  temperature >100.4  As directed    Diet - low sodium heart healthy  As directed    Discharge instructions  As directed    Comments:     You were hospitalized with aspiration pneumonia probably from your nose bleed.  Please continue taking clindamycin until it is completely gone.  Please leave your packing alone in your right nostril.  Use the nasal saline soaked gauze under the nose every few hours as instructed by Dr. Redmond Baseman and follow up with him in clinic tomorrow.  If you have increased shortness of breath or increased bleeding from your nose, please return to the hospital.  Please do not use ibuprofen or aspiration for your chronic pain, but tylenol is okay.  Use heating compresses as needed.  Talk to your primary care doctor about your chronic pain and treatment options.  If you developed watery diarrhea, this can be a sign of infectious diarrhea.  Please return to the hospital if this occurs because infectious diarrhea  can easily cause dehydration and needs to be treated with additional antibiotics.  To reduce the risk of infectious diarrhea, you may take florastor (prescription provided), however, this probiotic is somewhat expensive so if you are unable to afford it, please try eating yogurt with lactobacillus in it every day.   Driving Restrictions  As directed    Comments:     Until deemed safe by your primary care doctor   Increase activity slowly  As directed        Medication List         amitriptyline 10 MG tablet  Commonly known as:  ELAVIL  Take 10 mg by mouth daily.     clindamycin 300 MG capsule  Commonly known as:  CLEOCIN  Take 1 capsule (300 mg total) by mouth 3 (three) times daily. X 7 days     estrogens (  conjugated) 1.25 MG tablet  Commonly known as:  PREMARIN  Take 1.25 mg by mouth every morning.     ferrous sulfate 325 (65 FE) MG tablet  Take 325 mg by mouth daily with breakfast.     furosemide 40 MG tablet  Commonly known as:  LASIX  Take 40 mg by mouth daily.     omeprazole 40 MG capsule  Commonly known as:  PRILOSEC  Take 40 mg by mouth every morning.     PARoxetine 20 MG tablet  Commonly known as:  PAXIL  Take 20 mg by mouth every morning.     saccharomyces boulardii 250 MG capsule  Commonly known as:  FLORASTOR  Take 1 capsule (250 mg total) by mouth 2 (two) times daily.     sodium chloride 0.65 % Soln nasal spray  Commonly known as:  OCEAN  Place 2 sprays into both nostrils every 2 (two) hours while awake.     verapamil 120 MG CR tablet  Commonly known as:  CALAN-SR  Take 120 mg by mouth every morning.       Follow-up Information   Follow up with BATES, DWIGHT, MD. Schedule an appointment as soon as possible for a visit on 03/14/2014.   Specialty:  Otolaryngology   Contact information:   [address] [place] [address] [phone]       Follow up with [name], MD In 1 week.   Specialty:  Family Medicine    Contact information:   [place] [address] [phone]       The results of significant diagnostics from this hospitalization (including imaging, microbiology, ancillary and laboratory) are listed below for reference.    Significant Diagnostic Studies: Dg Chest Portable 1 View  03/10/2014   CLINICAL DATA:  Hypoxia  EXAM: PORTABLE CHEST - 1 VIEW  COMPARISON:  May 08, 2008  FINDINGS: There is left base infiltrate. There is underlying emphysema. Elsewhere lungs are clear. Heart is upper normal in size with normal pulmonary vascularity. No adenopathy.  IMPRESSION: Left base infiltrate.  Underlying emphysema.   Electronically Signed   By: Lowella Grip M.D.   On: 03/10/2014 19:56    Microbiology: No results found for this or any previous visit (from the past 240 hour(s)).   Labs: Basic Metabolic Panel:  Recent Labs Lab 03/10/14 1109 03/10/14 2010 03/11/14 0513 03/12/14 0515 03/13/14 0545  NA 140 139 141 143 138  K 3.5* 3.6* 3.9 4.0 3.9  CL 98 95* 99 95* 94*  CO2 32 33* 31 36* 32  GLUCOSE 114* 167* 110* 112* 141*  BUN 17 17 17 14 16   CREATININE 0.51 0.64 0.52 0.63 0.69  CALCIUM 8.6 9.1 8.8 9.1 8.4   Liver Function Tests: No results found for this basename: AST, ALT, ALKPHOS, BILITOT, PROT, ALBUMIN,  in the last 168 hours No results found for this basename: LIPASE, AMYLASE,  in the last 168 hours No results found for this basename: AMMONIA,  in the last 168 hours CBC:  Recent Labs Lab 03/10/14 1109 03/10/14 2010 03/11/14 0513 03/12/14 0515 03/13/14 0545  WBC 8.7 12.5* 11.0* 11.0* 10.1  NEUTROABS 6.5 10.1*  --   --   --   HGB 9.7* 9.2* 9.0* 9.5* 10.1*  HCT 30.3* 30.4* 29.5* 32.9* 34.0*  MCV 87.1 87.6 87.8 89.4 88.3  PLT 407* 418* 429* 432* 458*   Cardiac Enzymes: No results found for this basename: CKTOTAL, CKMB, CKMBINDEX, TROPONINI,  in  the last 168 hours BNP: BNP (last 3 results) No results found for this basename: PROBNP,  in the  last 8760 hours CBG: No results found for this basename: GLUCAP,  in the last 168 hours  Time coordinating discharge: 45 minutes  Signed:  Javayah Magaw  [facility] 03/13/2014, 10:06 AM

## 2014-03-13 NOTE — Care Management Note (Unsigned)
    Page 1 of 1   03/13/2014     11:31:01 AM   CARE MANAGEMENT NOTE 03/13/2014  Patient:  [name], [name]   Account Number:  [id number]  Date Initiated:  03/13/2014  Documentation initiated by:  [facility]  Subjective/Objective Assessment:   78 year old female admitted with respiratory failure.     Action/Plan:   From home. PT is recommending HHPT services but pt refuses the services.   Anticipated DC Date:  03/13/2014   Anticipated DC Plan:  [place]  CM consult  Patient refused services      Choice offered to / List presented to:             Status of service:  In process, will continue to follow Medicare Important Message given?  NA - LOS <3 / Initial given by admissions (If response is "NO", the following Medicare IM given date fields will be blank) Date Medicare IM given:   Date Additional Medicare IM given:    Discharge Disposition:    Per UR Regulation:  Reviewed for med. necessity/level of care/duration of stay  If discussed at [place] of Stay Meetings, dates discussed:    Comments:  03/13/14 Allene Dillon RN BSN Met with pt to discuss d/c planning. PT has recommended HHPT but she stated she does not want the services. She stated she lives with her son and his girlfriend and they assist her when needed.  Pt is needing oxygen at 6 liters via mask at d/c. [place] her DME company has been notified and they are able to accomodate the increase. She was previously on 2-3 liters [place]. Rodrigo Ran will bring her a tank for transport home and will assess if she needs another/new concentrator at home.

## 2014-03-13 NOTE — Evaluation (Signed)
Occupational Therapy Evaluation Patient Details Name: [name] MRN: [id number] DOB: [birth year] Today's Date: 03/13/2014 Time: 4854-6270 OT Time Calculation (min): 20 min  OT Assessment / Plan / Recommendation History of present illness 78 yo female admitted with nose bleed, acute resp failure.    Clinical Impression   Pt was seen for initial eval. She is at a set up to supervision level and no safety concerns noted during eval.  Pt feels she is at her baseline.  Discussed tub seat for safety and energy conservation, and pt is considering getting one herself.  She does have a non-skid mat    OT Assessment  Patient does not need any further OT services    Follow Up Recommendations  No OT follow up    Barriers to Discharge      Equipment Recommendations   (tub seat, which pt is considering getting)    Recommendations for Other Services    Frequency       Precautions / Restrictions Precautions Precautions: Fall Precaution Comments: nose bleed, monitor sats Restrictions Weight Bearing Restrictions: No   Pertinent Vitals/Pain No pain reported.  Pt is on 6 liters.  She wears 3 at baseline    ADL  Grooming: Teeth care Where Assessed - Grooming: Unsupported standing Toilet Transfer: Copy Method: Sit to Loss adjuster, chartered: Therapist, occupational and Hygiene: Supervision/safety Where Assessed - Toileting Clothing Manipulation and Hygiene: Sit to stand from 3-in-1 or toilet Transfers/Ambulation Related to ADLs: ambulated to sink and back to chair. Was on 3:1 when I arrived.   ADL Comments: Pt able to perform adls with set up.  She feels she is at baseline and doesn't feel she needs any more OT.  Lives with son and his girlfiend as has help as needed, but she is very independent.  Pt does not have a shower seat and is considering getting this.  She is of short stature and doesn't need 3:1 commode    OT  Diagnosis:    OT Problem List:   OT Treatment Interventions:     OT Goals(Current goals can be found in the care plan section)    Visit Information  Last OT Received On: 03/13/14 Assistance Needed: +1 History of Present Illness: 78 yo female admitted with nose bleed, acute resp failure.        Prior [place] expects to be discharged to:: Private residence Living Arrangements: Children Type of Home: [place]: [place] - 2 wheels Prior Function ADL's / Homemaking Assistance Needed: son assists with meals, transportation Communication Communication: No difficulties         Vision/Perception     Cognition  Cognition Arousal/Alertness: Awake/alert Behavior During Therapy: WFL for tasks assessed/performed Overall Cognitive Status: Within Functional Limits for tasks assessed    Extremity/Trunk Assessment Upper Extremity Assessment Upper Extremity Assessment: Generalized weakness     Mobility Transfers Sit to Stand: Supervision General transfer comment: no LOB     Exercise     Balance     End of Session OT - End of Session Activity Tolerance: Patient tolerated treatment well Patient left: in chair;with call bell/phone within reach;with nursing/sitter in room  GO     [name] 03/13/2014, 9:44 AM [name], OTR/L [phone] 03/13/2014

## 2014-03-19 ENCOUNTER — Encounter (HOSPITAL_COMMUNITY): Payer: Self-pay | Admitting: Emergency Medicine

## 2014-03-19 ENCOUNTER — Emergency Department (HOSPITAL_COMMUNITY): Payer: Medicare Other

## 2014-03-19 ENCOUNTER — Inpatient Hospital Stay (HOSPITAL_COMMUNITY)
Admission: EM | Admit: 2014-03-19 | Discharge: 2014-03-21 | DRG: 392 | Disposition: A | Discharge status: Home / Self Care | Payer: Medicare Other | Attending: Internal Medicine | Admitting: Internal Medicine

## 2014-03-19 DIAGNOSIS — J690 Pneumonitis due to inhalation of food and vomit: Secondary | ICD-10-CM

## 2014-03-19 DIAGNOSIS — J9610 Chronic respiratory failure, unspecified whether with hypoxia or hypercapnia: Secondary | ICD-10-CM | POA: Diagnosis present

## 2014-03-19 DIAGNOSIS — D649 Anemia, unspecified: Secondary | ICD-10-CM

## 2014-03-19 DIAGNOSIS — F17200 Nicotine dependence, unspecified, uncomplicated: Secondary | ICD-10-CM | POA: Diagnosis present

## 2014-03-19 DIAGNOSIS — R040 Epistaxis: Secondary | ICD-10-CM

## 2014-03-19 DIAGNOSIS — K529 Noninfective gastroenteritis and colitis, unspecified: Secondary | ICD-10-CM

## 2014-03-19 DIAGNOSIS — F329 Major depressive disorder, single episode, unspecified: Secondary | ICD-10-CM | POA: Diagnosis present

## 2014-03-19 DIAGNOSIS — Z9981 Dependence on supplemental oxygen: Secondary | ICD-10-CM

## 2014-03-19 DIAGNOSIS — D638 Anemia in other chronic diseases classified elsewhere: Secondary | ICD-10-CM | POA: Diagnosis present

## 2014-03-19 DIAGNOSIS — J449 Chronic obstructive pulmonary disease, unspecified: Secondary | ICD-10-CM | POA: Diagnosis present

## 2014-03-19 DIAGNOSIS — K5289 Other specified noninfective gastroenteritis and colitis: Secondary | ICD-10-CM | POA: Diagnosis present

## 2014-03-19 DIAGNOSIS — F3289 Other specified depressive episodes: Secondary | ICD-10-CM | POA: Diagnosis present

## 2014-03-19 DIAGNOSIS — I10 Essential (primary) hypertension: Secondary | ICD-10-CM | POA: Diagnosis present

## 2014-03-19 DIAGNOSIS — J4489 Other specified chronic obstructive pulmonary disease: Secondary | ICD-10-CM | POA: Diagnosis present

## 2014-03-19 DIAGNOSIS — A0811 Acute gastroenteropathy due to Norwalk agent: Secondary | ICD-10-CM | POA: Diagnosis present

## 2014-03-19 DIAGNOSIS — F32A Depression, unspecified: Secondary | ICD-10-CM | POA: Diagnosis present

## 2014-03-19 DIAGNOSIS — A080 Rotaviral enteritis: Secondary | ICD-10-CM | POA: Diagnosis present

## 2014-03-19 DIAGNOSIS — G8929 Other chronic pain: Secondary | ICD-10-CM | POA: Diagnosis present

## 2014-03-19 DIAGNOSIS — J9601 Acute respiratory failure with hypoxia: Secondary | ICD-10-CM

## 2014-03-19 DIAGNOSIS — K921 Melena: Secondary | ICD-10-CM

## 2014-03-19 DIAGNOSIS — M810 Age-related osteoporosis without current pathological fracture: Secondary | ICD-10-CM | POA: Diagnosis present

## 2014-03-19 DIAGNOSIS — Z79899 Other long term (current) drug therapy: Secondary | ICD-10-CM | POA: Diagnosis not present

## 2014-03-19 DIAGNOSIS — E876 Hypokalemia: Secondary | ICD-10-CM

## 2014-03-19 DIAGNOSIS — R1084 Generalized abdominal pain: Secondary | ICD-10-CM

## 2014-03-19 DIAGNOSIS — A419 Sepsis, unspecified organism: Secondary | ICD-10-CM

## 2014-03-19 DIAGNOSIS — Z720 Tobacco use: Secondary | ICD-10-CM

## 2014-03-19 HISTORY — DX: Noninfective gastroenteritis and colitis, unspecified: K52.9

## 2014-03-19 LAB — CBC WITH DIFFERENTIAL/PLATELET
Basophils Absolute: 0 10*3/uL (ref 0.0–0.1)
Basophils Relative: 0 % (ref 0–1)
EOS ABS: 0 10*3/uL (ref 0.0–0.7)
Eosinophils Relative: 0 % (ref 0–5)
HCT: 34 % — ABNORMAL LOW (ref 36.0–46.0)
HEMOGLOBIN: 10.7 g/dL — AB (ref 12.0–15.0)
Lymphocytes Relative: 8 % — ABNORMAL LOW (ref 12–46)
Lymphs Abs: 1.8 10*3/uL (ref 0.7–4.0)
MCH: 26.6 pg (ref 26.0–34.0)
MCHC: 31.5 g/dL (ref 30.0–36.0)
MCV: 84.4 fL (ref 78.0–100.0)
MONOS PCT: 3 % (ref 3–12)
Monocytes Absolute: 0.7 10*3/uL (ref 0.1–1.0)
NEUTROS PCT: 89 % — AB (ref 43–77)
Neutro Abs: 20.6 10*3/uL — ABNORMAL HIGH (ref 1.7–7.7)
Platelets: 692 10*3/uL — ABNORMAL HIGH (ref 150–400)
RBC: 4.03 MIL/uL (ref 3.87–5.11)
RDW: 16.6 % — ABNORMAL HIGH (ref 11.5–15.5)
WBC: 23.2 10*3/uL — ABNORMAL HIGH (ref 4.0–10.5)

## 2014-03-19 LAB — COMPREHENSIVE METABOLIC PANEL
ALT: 10 U/L (ref 0–35)
AST: 12 U/L (ref 0–37)
Albumin: 3.1 g/dL — ABNORMAL LOW (ref 3.5–5.2)
Alkaline Phosphatase: 74 U/L (ref 39–117)
BUN: 9 mg/dL (ref 6–23)
CO2: 23 mEq/L (ref 19–32)
Calcium: 8.7 mg/dL (ref 8.4–10.5)
Chloride: 100 mEq/L (ref 96–112)
Creatinine, Ser: 0.52 mg/dL (ref 0.50–1.10)
GFR calc Af Amer: >90 mL/min (ref >90)
GFR calc non Af Amer: >90 mL/min (ref >90)
Glucose, Bld: 123 mg/dL — ABNORMAL HIGH (ref 70–99)
POTASSIUM: 3.8 meq/L (ref 3.7–5.3)
Sodium: 137 mEq/L (ref 137–147)
TOTAL PROTEIN: 6.9 g/dL (ref 6.0–8.3)

## 2014-03-19 LAB — URINALYSIS, ROUTINE W REFLEX MICROSCOPIC
Bilirubin Urine: NEGATIVE
Glucose, UA: NEGATIVE mg/dL
Hgb urine dipstick: NEGATIVE
KETONES UR: 15 mg/dL — AB
LEUKOCYTES UA: NEGATIVE
Nitrite: NEGATIVE
PH: 5.5 (ref 5.0–8.0)
PROTEIN: NEGATIVE mg/dL
Specific Gravity, Urine: >1.046 — ABNORMAL HIGH (ref 1.005–1.030)
UROBILINOGEN UA: 0.2 mg/dL (ref 0.0–1.0)

## 2014-03-19 LAB — CLOSTRIDIUM DIFFICILE BY PCR: Toxigenic C. Difficile by PCR: NEGATIVE

## 2014-03-19 MED ORDER — SALINE SPRAY 0.65 % NA SOLN
2.0000 | NASAL | Status: DC
  Administered 2014-03-19 – 2014-03-21 (×13): 2 via NASAL
  Filled 2014-03-19: qty 44

## 2014-03-19 MED ORDER — PAROXETINE HCL 20 MG PO TABS
20.0000 mg | ORAL_TABLET | Freq: Every morning | ORAL | Status: DC
Start: 2014-03-20 — End: 2014-03-21
  Administered 2014-03-20 – 2014-03-21 (×2): 20 mg via ORAL
  Filled 2014-03-19 (×2): qty 1

## 2014-03-19 MED ORDER — MORPHINE SULFATE 2 MG/ML IJ SOLN
1.0000 mg | INTRAMUSCULAR | Status: DC | PRN
  Administered 2014-03-20 – 2014-03-21 (×5): 1 mg via INTRAVENOUS
  Filled 2014-03-19 (×5): qty 1

## 2014-03-19 MED ORDER — FENTANYL CITRATE 0.05 MG/ML IJ SOLN
50.0000 ug | Freq: Once | INTRAMUSCULAR | Status: AC
  Administered 2014-03-19: 50 ug via INTRAVENOUS
  Filled 2014-03-19: qty 2

## 2014-03-19 MED ORDER — ONDANSETRON HCL 4 MG PO TABS
4.0000 mg | ORAL_TABLET | Freq: Four times a day (QID) | ORAL | Status: DC | PRN
  Administered 2014-03-20: 4 mg via ORAL
  Filled 2014-03-19: qty 1

## 2014-03-19 MED ORDER — VANCOMYCIN 50 MG/ML ORAL SOLUTION
125.0000 mg | Freq: Four times a day (QID) | ORAL | Status: DC
  Filled 2014-03-19 (×3): qty 2.5

## 2014-03-19 MED ORDER — ONDANSETRON HCL 4 MG/2ML IJ SOLN: 4.0000 mg | Freq: Once | INTRAMUSCULAR | Status: DC

## 2014-03-19 MED ORDER — POTASSIUM CHLORIDE IN NACL 20-0.9 MEQ/L-% IV SOLN
INTRAVENOUS | Status: DC
  Administered 2014-03-19 – 2014-03-21 (×3): via INTRAVENOUS
  Filled 2014-03-19 (×7): qty 1000

## 2014-03-19 MED ORDER — FAMOTIDINE 20 MG PO TABS
20.0000 mg | ORAL_TABLET | Freq: Every day | ORAL | Status: DC
  Administered 2014-03-19 – 2014-03-21 (×3): 20 mg via ORAL
  Filled 2014-03-19 (×4): qty 1

## 2014-03-19 MED ORDER — ACETAMINOPHEN 650 MG RE SUPP: 650.0000 mg | Freq: Four times a day (QID) | RECTAL | Status: DC | PRN

## 2014-03-19 MED ORDER — IOHEXOL 300 MG/ML  SOLN
100.0000 mL | Freq: Once | INTRAMUSCULAR | Status: AC | PRN
  Administered 2014-03-19: 100 mL via INTRAVENOUS

## 2014-03-19 MED ORDER — CIPROFLOXACIN IN D5W 400 MG/200ML IV SOLN
400.0000 mg | Freq: Two times a day (BID) | INTRAVENOUS | Status: DC
  Administered 2014-03-19 – 2014-03-20 (×3): 400 mg via INTRAVENOUS
  Filled 2014-03-19 (×5): qty 200

## 2014-03-19 MED ORDER — IPRATROPIUM-ALBUTEROL 0.5-2.5 (3) MG/3ML IN SOLN
3.0000 mL | Freq: Three times a day (TID) | RESPIRATORY_TRACT | Status: DC
  Filled 2014-03-19 (×2): qty 3

## 2014-03-19 MED ORDER — GUAIFENESIN-DM 100-10 MG/5ML PO SYRP: 5.0000 mL | ORAL_SOLUTION | ORAL | Status: DC | PRN

## 2014-03-19 MED ORDER — OXYCODONE HCL 5 MG PO TABS
5.0000 mg | ORAL_TABLET | ORAL | Status: DC | PRN
  Administered 2014-03-19 – 2014-03-21 (×7): 5 mg via ORAL
  Filled 2014-03-19 (×8): qty 1

## 2014-03-19 MED ORDER — IOHEXOL 300 MG/ML  SOLN
50.0000 mL | Freq: Once | INTRAMUSCULAR | Status: AC | PRN
  Administered 2014-03-19: 50 mL via ORAL

## 2014-03-19 MED ORDER — ESTROGENS CONJUGATED 1.25 MG PO TABS
1.2500 mg | ORAL_TABLET | Freq: Every morning | ORAL | Status: DC
  Administered 2014-03-20 – 2014-03-21 (×2): 1.25 mg via ORAL
  Filled 2014-03-19 (×2): qty 1

## 2014-03-19 MED ORDER — SACCHAROMYCES BOULARDII 250 MG PO CAPS
250.0000 mg | ORAL_CAPSULE | Freq: Two times a day (BID) | ORAL | Status: DC
  Administered 2014-03-19 – 2014-03-21 (×4): 250 mg via ORAL
  Filled 2014-03-19 (×5): qty 1

## 2014-03-19 MED ORDER — AMITRIPTYLINE HCL 10 MG PO TABS
10.0000 mg | ORAL_TABLET | Freq: Every day | ORAL | Status: DC
  Administered 2014-03-19 – 2014-03-21 (×3): 10 mg via ORAL
  Filled 2014-03-19 (×4): qty 1

## 2014-03-19 MED ORDER — ONDANSETRON HCL 4 MG/2ML IJ SOLN
4.0000 mg | Freq: Four times a day (QID) | INTRAMUSCULAR | Status: DC | PRN
  Administered 2014-03-19 – 2014-03-21 (×5): 4 mg via INTRAVENOUS
  Filled 2014-03-19 (×5): qty 2

## 2014-03-19 MED ORDER — ACETAMINOPHEN 325 MG PO TABS: 650.0000 mg | ORAL_TABLET | Freq: Four times a day (QID) | ORAL | Status: DC | PRN

## 2014-03-19 MED ORDER — SODIUM CHLORIDE 0.9 % IV BOLUS (SEPSIS)
500.0000 mL | Freq: Once | INTRAVENOUS | Status: AC
  Administered 2014-03-19: 500 mL via INTRAVENOUS

## 2014-03-19 MED ORDER — VERAPAMIL HCL ER 120 MG PO TBCR
120.0000 mg | EXTENDED_RELEASE_TABLET | Freq: Every morning | ORAL | Status: DC
  Administered 2014-03-20 – 2014-03-21 (×2): 120 mg via ORAL
  Filled 2014-03-19 (×2): qty 1

## 2014-03-19 MED ORDER — ALBUTEROL SULFATE (2.5 MG/3ML) 0.083% IN NEBU: 2.5000 mg | INHALATION_SOLUTION | RESPIRATORY_TRACT | Status: DC | PRN

## 2014-03-19 MED ORDER — METRONIDAZOLE IN NACL 5-0.79 MG/ML-% IV SOLN
500.0000 mg | Freq: Three times a day (TID) | INTRAVENOUS | Status: DC
  Administered 2014-03-19 – 2014-03-21 (×5): 500 mg via INTRAVENOUS
  Filled 2014-03-19 (×6): qty 100

## 2014-03-19 NOTE — Progress Notes (Signed)
Received report from ED RN, Pt arrived unit on stretcher. Will continue with current plan of care.

## 2014-03-19 NOTE — ED Notes (Signed)
Pt not wanting to go to 5th floor. Charge confirmed with [facility] this was the only floor available for pt to go to. Pt explained this and willing to go to floor.

## 2014-03-19 NOTE — H&P (Signed)
PATIENT DETAILS Name: [name] Age: 78 y.o. Sex: female Date of Birth: December 23, 1936 Admit Date: 03/19/2014 JOA:CZYSAY,TKZSWF OLIVER, MD   CHIEF COMPLAINT:  Lower abdominal pain, diarrhea and vomiting since yesterday  HPI: [name] is a 78 y.o. female recently hospitalized and was discharged last week with a Past Medical History COPD with chronic respiratory failure on home O2, hypertension who presents today with the above noted complaint. Patient since discharge from the hospital last week she was doing relatively okay, she did not fill the antibiotic she was prescribed. Yesterday she started having bilateral lower abdominal pain, which he describes as crampy, 10/10 at its worst without any radiation. She denies any associated fever. She also then started to have nausea with vomiting. Patient claims to have vomited numerous times yesterday and numerous times today as well. No blood in the vomitus. Patient claims to have had numerous episodes of loose watery stools, she claims that today she has noticed some frank blood in her stools. She was brought to the emergency room, CBC showed a white count of 23,000, CT of the abdomen shows transverse colitis. I was subsequently asked to admit this patient for further evaluation and treatment. During my evaluation, patient had just received IV fentanyl, she had no abdominal pain during my assessment. She denies any headache, fever, chest pain, worsening shortness of breath, dysuria or hematuria he did  ALLERGIES:  No Known Allergies  PAST MEDICAL HISTORY: Past Medical History  Diagnosis Date  . COPD (chronic obstructive pulmonary disease)   . Chronic back pain   . Osteoporosis   . On home oxygen therapy 06-14-13    continuos 2.5l/m nasally-24/7  . Hypertension     PAST SURGICAL HISTORY: Past Surgical History  Procedure Laterality Date  . Abdominal hysterectomy    . Sacroplasty  06-14-13    05-10-13 IVR CONE for fracture  stabilization  . Esophagogastroduodenoscopy N/A 06/15/2013    Procedure: ESOPHAGOGASTRODUODENOSCOPY (EGD);  Surgeon: [name], MD;  Location: [facility];  Service: Endoscopy;  Laterality: N/A;    MEDICATIONS AT HOME: Prior to Admission medications   Medication Sig Start Date End Date Taking? Authorizing Provider  amitriptyline (ELAVIL) 10 MG tablet Take 10 mg by mouth daily.    Yes Historical Provider, MD  DULoxetine (CYMBALTA) 60 MG capsule Take 60 mg by mouth daily.   Yes Historical Provider, MD  estrogens, conjugated, (PREMARIN) 1.25 MG tablet Take 1.25 mg by mouth every morning.   Yes Historical Provider, MD  furosemide (LASIX) 40 MG tablet Take 40 mg by mouth daily as needed for fluid.    Yes Historical Provider, MD  omeprazole (PRILOSEC) 40 MG capsule Take 40 mg by mouth every morning.   Yes Historical Provider, MD  PARoxetine (PAXIL) 20 MG tablet Take 20 mg by mouth every morning.   Yes Historical Provider, MD  saccharomyces boulardii (FLORASTOR) 250 MG capsule Take 1 capsule (250 mg total) by mouth 2 (two) times daily. 03/13/14  Yes Janece Canterbury, MD  sodium chloride (OCEAN) 0.65 % SOLN nasal spray Place 2 sprays into both nostrils every 2 (two) hours while awake. 03/13/14  Yes Janece Canterbury, MD  verapamil (CALAN-SR) 120 MG CR tablet Take 120 mg by mouth every morning.    Yes Historical Provider, MD  clindamycin (CLEOCIN) 300 MG capsule Take 1 capsule (300 mg total) by mouth 3 (three) times daily. X 7 days 03/10/14   Wandra Arthurs, MD  FAMILY HISTORY: No family history of coronary artery disease  SOCIAL HISTORY:  reports that she still smokes. She reports that she does not drink alcohol or use illicit drugs.  REVIEW OF SYSTEMS:  Constitutional:   No  weight loss, night sweats,  Fevers, chills, fatigue.  HEENT:    No headaches, Difficulty swallowing,Tooth/dental problems,Sore throat,  No sneezing, itching, ear ache, nasal congestion, post nasal drip,    Cardio-vascular: No chest pain,  Orthopnea, PND, swelling in lower extremities, anasarca,  dizziness, palpitations  GI:  No heartburn, indigestion Resp: No shortness of breath with exertion or at rest.  No excess mucus, no productive cough, No non-productive cough,  No coughing up of blood.No change in color of mucus.No wheezing.No chest wall deformity  Skin:  no rash or lesions.  GU:  no dysuria, change in color of urine, no urgency or frequency.  No flank pain.  Musculoskeletal: No joint pain or swelling.  No decreased range of motion.  No back pain.  Psych: No change in mood or affect. No depression or anxiety.  No memory loss.   PHYSICAL EXAM: Blood pressure 153/77, pulse 108, temperature 98.5 F (36.9 C), temperature source Rectal, resp. rate 18, SpO2 100.00%.  General appearance :Awake, alert, not in any distress. Speech Clear. Not toxic Looking HEENT: Atraumatic and Normocephalic, pupils equally reactive to light and accomodation Neck: supple, no JVD. No cervical lymphadenopathy.  Chest:Good air entry bilaterally, no added sounds  CVS: S1 S2 regular, no murmurs.  Abdomen: Bowel sounds present, mildly tender in the lower abdomen (status post IV fentanyl) and not distended with no gaurding, rigidity or rebound. Extremities: B/L Lower Ext shows no edema, both legs are warm to touch Neurology: Awake alert, and oriented X 3, CN II-XII intact, Non focal Skin:No Rash Wounds:N/A  LABS ON ADMISSION:   Recent Labs  03/19/14 1429  NA 137  K 3.8  CL 100  CO2 23  GLUCOSE 123*  BUN 9  CREATININE 0.52  CALCIUM 8.7    Recent Labs  03/19/14 1429  AST 12  ALT 10  ALKPHOS 74  BILITOT <0.2*  PROT 6.9  ALBUMIN 3.1*   No results found for this basename: LIPASE, AMYLASE,  in the last 72 hours  Recent Labs  03/19/14 1429  WBC 23.2*  NEUTROABS 20.6*  HGB 10.7*  HCT 34.0*  MCV 84.4  PLT 692*   No results found for this basename: CKTOTAL, CKMB, CKMBINDEX,  TROPONINI,  in the last 72 hours No results found for this basename: DDIMER,  in the last 72 hours No components found with this basename: POCBNP,    RADIOLOGIC STUDIES ON ADMISSION: Ct Abdomen Pelvis W Contrast  03/19/2014   CLINICAL DATA:  Diarrhea.  EXAM: CT ABDOMEN AND PELVIS WITH CONTRAST  TECHNIQUE: Multidetector CT imaging of the abdomen and pelvis was performed using the standard protocol following bolus administration of intravenous contrast.  CONTRAST:  172mL OMNIPAQUE IOHEXOL 300 MG/ML  SOLN  COMPARISON:  03/19/2014 plain film exam.  06/12/2013 CT.  FINDINGS: Diffuse colitis predominately involving the transverse colon although with extension into the proximal ascending colon. No free intraperitoneal air or pneumatosis. Free fluid within the pelvis most likely related to the colitis. Source of the colitis is indeterminate whether from infectious, inflammatory or ischemic etiology.  Major vascular structures are patent. There are significant atherosclerotic type changes of the abdominal aorta and branch vessels. Ectatic abdominal aorta measuring up to 1.9 cm. Narrowing of the common iliac arteries.  Elongated  liver spanning over 19.2 cm with slightly lobulated contour and mildly widened fissures but without definitive findings of cirrhosis and appearance unchanged.  No calcified gallstones. No focal splenic, pancreatic or adrenal abnormality.  Sub cm right renal low-density structure too small to characterize and without significant change (possibly a small cyst). 5 mm and 3 mm nonobstructing left lower pole renal calculi. Extra renal type pelvis more notable on the left.  Fatty containing periumbilical hernia. Fatty and vessel containing left Spigelian hernia.  Remote fracture T11 and L3 treated with cement augmentation. Cement augmentation of the sacrum. No new compression fracture noted.  Heart slightly enlarged.  Non contrast filled views of the urinary bladder unremarkable.  Post hysterectomy.   Left ovarian cyst slightly larger than on the prior examination with maximal dimension of 2.8 cm versus prior 2.4 cm. This can be assessed with elective pelvic sonogram. .  IMPRESSION: Diffuse colitis predominantly involving transverse colon. Please see above for further detail and additional findings.  These results were called by telephone at the time of interpretation on 03/19/2014 at 4:20 PM to Dr. Mali Shelton , who verbally acknowledged these results.   Electronically Signed   By: Chauncey Cruel M.D.   On: 03/19/2014 16:35   Dg Abd Acute W/chest  03/19/2014   CLINICAL DATA:  Abdominal pain.  EXAM: ACUTE ABDOMEN SERIES (ABDOMEN 2 VIEW & CHEST 1 VIEW)  COMPARISON:  03/10/2014 chest x-ray. CT abdomen pelvis performed same day.  FINDINGS: Lung parenchymal scarring suspected without segmental infiltrate, pulmonary edema, or pneumothorax.  Fullness in the aortic pulmonary window region. It is possible this is related to technique however, followup two view chest or chest CT may be considered for further delineation.  Abnormal bowel gas pattern better demonstrated on the accompanying CT which reveals diffuse colitis predominantly involving the transverse colon without evidence of free intraperitoneal air.  Remote cement augmentation L3, T11 and bilateral upper sacrum.  IMPRESSION: Abnormal bowel gas pattern with transverse colon colitis better demonstrated on CT performed same day.  Fullness of the aorta pulmonary window region may be projectional but will require followup two view chest or chest CT to help exclude mass/adenopathy.   Electronically Signed   By: Chauncey Cruel M.D.   On: 03/19/2014 16:45    EKG: Independently reviewed. Sinus tachycardia  ASSESSMENT AND PLAN: Present on Admission:  . Colitis - High suspicion for infectious colitis-likely C. difficile colitis, await C. difficile PCR, in the meantime empirically start oral vancomycin. Continue with probiotic. Continue with supportive care in the  form of IV fluids, IV antiemetics and narcotics. If C. difficile PCR negative, will send out further stool studies. Will continue to monitor clinically and follow her clinical course and make adjustments plans accordingly. Patient looks much better than her lab numbers, she is afebrile and Has a nontoxic appearance.  . Mild hematochezia - Suspect likely secondary to above other than a overt GI bleeding. Continue to monitor CBC.  Marland Kitchen COPD (chronic obstructive pulmonary disease) - Stable, lungs clear. As needed nebulized bronchodilators   . Hypertension - Continue verapamil   . Chronic respiratory failure - On home O2, continue   . Depression - Continue Elavil and Paxil   Further plan will depend as patient's clinical course evolves and further radiologic and laboratory data become available. Patient will be monitored closely.  Above noted plan was discussed with patient she was in agreement.   DVT Prophylaxis: SCD's  Code Status: Full Code  Total time spent for admission  equals 45 minutes.  [facility] Pager [phone]  If 7PM-7AM, please contact night-coverage [email or web address] Password [email or web address] 03/19/2014, 5:27 PM

## 2014-03-19 NOTE — Progress Notes (Signed)
ANTIBIOTIC CONSULT NOTE - INITIAL  Pharmacy Consult for Vancomycin Indication: C.diff  No Known Allergies  Patient Measurements:     Vital Signs: Temp: 98.5 F (36.9 C) (03/23 1507) Temp src: Rectal (03/23 1507) BP: 153/77 mmHg (03/23 1338) Pulse Rate: 108 (03/23 1338) Intake/Output from previous day:   Intake/Output from this shift:    Labs:  Recent Labs  03/19/14 1429  WBC 23.2*  HGB 10.7*  PLT 692*  CREATININE 0.52   The CrCl is unknown because both a height and weight (above a minimum accepted value) are required for this calculation. No results found for this basename: VANCOTROUGH, VANCOPEAK, VANCORANDOM, GENTTROUGH, GENTPEAK, GENTRANDOM, TOBRATROUGH, TOBRAPEAK, TOBRARND, AMIKACINPEAK, AMIKACINTROU, AMIKACIN,  in the last 72 hours   Microbiology: No results found for this or any previous visit (from the past 720 hour(s)).  Medical History: Past Medical History  Diagnosis Date  . COPD (chronic obstructive pulmonary disease)   . Chronic back pain   . Osteoporosis   . On home oxygen therapy 06-14-13    continuos 2.5l/m nasally-24/7  . Hypertension     Assessment: 60 yoF presents with nausea, vomiting, abdominal pain, and bloody diarrhea.  Patient recently discharged 3/17 for acute respiratory failure and sepsis secondary to aspiration pneumonia.  She completed 4 days of zosyn and was transitioned to clindamycin at discharge to complete a 10-day course.  Per EDP note, patient did not take outpatient antibiotics.  Patient's WBC is 23K and has frank red blood on rectal exam.  CT abdomen pending.  Pharmacy consulted to dose PO vancomycin for r/o C.diff.    Afebrile  WBC 23.2  SCr 0.52  C.diff PCR pending.  Goal of Therapy:  Eradication of infection  Plan:  1.  Vancomycin 125 mg PO QID x 14 days. 2.  F/u result of C.diff PCR.  Hershal Coria 03/19/2014,4:31 PM

## 2014-03-19 NOTE — Progress Notes (Signed)
C Diff PCR negative, will stop oral Vanco, and start empiric Cipro/Flagy-?ischemic colitis, ?infectious-await further stool studies.

## 2014-03-19 NOTE — ED Provider Notes (Signed)
CSN: [id number]     Arrival date & time 03/19/14  1329 History   First MD Initiated Contact with Patient 03/19/14 1342     Chief Complaint  Patient presents with  . Diarrhea  . Emesis  . Nausea  . Abdominal Pain     (Consider location/radiation/quality/duration/timing/severity/associated sxs/prior Treatment) Patient is a 77 y.o. female presenting with diarrhea, vomiting, and abdominal pain. The history is provided by the patient.  Diarrhea Associated symptoms: abdominal pain, chills and vomiting   Associated symptoms: no fever and no headaches   Emesis Associated symptoms: abdominal pain, chills and diarrhea   Associated symptoms: no headaches   Abdominal Pain Associated symptoms: chills, diarrhea, fatigue, nausea and vomiting   Associated symptoms: no chest pain, no fever and no shortness of breath    patient presents with nausea vomiting and diarrhea. She also states she's had pain and diarrhea with blood in it. She was recently in the hospital with a pneumonia. She was supposed to clindamycin, but states she did not get filled. She states her breathing has been doing okay. She also states she's severe lower abdominal pain. She denies fever but states she's had some chills. She denies dysuria. She's had a decreased appetite.  Past Medical History  Diagnosis Date  . COPD (chronic obstructive pulmonary disease)   . Chronic back pain   . Osteoporosis   . On home oxygen therapy 06-14-13    continuos 2.5l/m nasally-24/7  . Hypertension    Past Surgical History  Procedure Laterality Date  . Abdominal hysterectomy    . Sacroplasty  06-14-13    05-10-13 IVR CONE for fracture stabilization  . Esophagogastroduodenoscopy N/A 06/15/2013    Procedure: ESOPHAGOGASTRODUODENOSCOPY (EGD);  Surgeon: [name], MD;  Location: [facility];  Service: Endoscopy;  Laterality: N/A;   No family history on file. History  Substance Use Topics  . Smoking status: Former Smoker    Types:  Cigarettes    Quit date: 06/15/2011  . Smokeless tobacco: Never Used  . Alcohol Use: No   OB History   Grav Para Term Preterm Abortions TAB SAB Ect Mult Living                 Review of Systems  Constitutional: Positive for chills, appetite change and fatigue. Negative for fever and activity change.  Eyes: Negative for pain.  Respiratory: Negative for chest tightness and shortness of breath.   Cardiovascular: Negative for chest pain and leg swelling.  Gastrointestinal: Positive for nausea, vomiting, abdominal pain and diarrhea.  Genitourinary: Negative for flank pain.  Musculoskeletal: Negative for back pain and neck stiffness.  Skin: Negative for rash.  Neurological: Negative for weakness, numbness and headaches.  Psychiatric/Behavioral: Negative for behavioral problems.      Allergies  Review of patient's allergies indicates no known allergies.  Home Medications   Current Outpatient Rx  Name  Route  Sig  Dispense  Refill  . amitriptyline (ELAVIL) 10 MG tablet   Oral   Take 10 mg by mouth daily.          . DULoxetine (CYMBALTA) 60 MG capsule   Oral   Take 60 mg by mouth daily.         Marland Kitchen estrogens, conjugated, (PREMARIN) 1.25 MG tablet   Oral   Take 1.25 mg by mouth every morning.         . furosemide (LASIX) 40 MG tablet   Oral   Take 40 mg by mouth daily as needed  for fluid.          Marland Kitchen omeprazole (PRILOSEC) 40 MG capsule   Oral   Take 40 mg by mouth every morning.         Marland Kitchen PARoxetine (PAXIL) 20 MG tablet   Oral   Take 20 mg by mouth every morning.         Marland Kitchen saccharomyces boulardii (FLORASTOR) 250 MG capsule   Oral   Take 1 capsule (250 mg total) by mouth 2 (two) times daily.   60 capsule   0   . sodium chloride (OCEAN) 0.65 % SOLN nasal spray   Each Nare   Place 2 sprays into both nostrils every 2 (two) hours while awake.         . verapamil (CALAN-SR) 120 MG CR tablet   Oral   Take 120 mg by mouth every morning.          .  clindamycin (CLEOCIN) 300 MG capsule   Oral   Take 1 capsule (300 mg total) by mouth 3 (three) times daily. X 7 days   21 capsule   0    BP 153/77  Pulse 108  Temp(Src) 98.5 F (36.9 C) (Rectal)  Resp 18  SpO2 100% Physical Exam  Constitutional: She is oriented to person, place, and time. She appears well-developed and well-nourished.  HENT:  Head: Normocephalic.  Eyes: Pupils are equal, round, and reactive to light.  Cardiovascular: Regular rhythm.   Mild tachycardia  Pulmonary/Chest: Effort normal and breath sounds normal.  Patient is wearing a face mask for oxygen  Abdominal: She exhibits distension.  Hyperactive bowel sounds. Mild distention. Tender worse inferiorly. Reducible umbilical hernia.  Genitourinary:  Mild thin red gross blood.  Musculoskeletal: She exhibits edema.  Neurological: She is alert and oriented to person, place, and time.  Skin: Skin is warm.    ED Course  Procedures (including critical care time) Labs Review Labs Reviewed  CBC WITH DIFFERENTIAL - Abnormal; Notable for the following:    WBC 23.2 (*)    Hemoglobin 10.7 (*)    HCT 34.0 (*)    RDW 16.6 (*)    Platelets 692 (*)    Neutrophils Relative % 89 (*)    Neutro Abs 20.6 (*)    Lymphocytes Relative 8 (*)    All other components within normal limits  COMPREHENSIVE METABOLIC PANEL - Abnormal; Notable for the following:    Glucose, Bld 123 (*)    Albumin 3.1 (*)    Total Bilirubin <0.2 (*)    All other components within normal limits  CLOSTRIDIUM DIFFICILE BY PCR  URINALYSIS, ROUTINE W REFLEX MICROSCOPIC   Imaging Review No results found.   EKG Interpretation   Date/Time:  Monday March 19 2014 13:42:56 EDT Ventricular Rate:  105 PR Interval:  150 QRS Duration: 90 QT Interval:  376 QTC Calculation: 497 R Axis:   27 Text Interpretation:  Sinus tachycardia Low voltage, precordial leads  Borderline prolonged QT interval Confirmed by Alvino Chapel  MD, Ovid Curd  [phone]) on 03/19/2014  3:16:27 PM      MDM   Final diagnoses:  None    Patient presents with nausea vomiting abdominal pain and some bloody diarrhea. Recent admission to the hospital. Lab work shows a white count of 23. She has frank blood on rectal exam. Had had recent pneumonia and did not take outpatient antibiotics. Will likely require admission. CT scan of abdomen and x-ray of chest and abdomen pending.    Ovid Curd  Rita Ohara, MD 03/19/14 [phone]

## 2014-03-19 NOTE — ED Notes (Signed)
Bed: WA06 Expected date:  Expected time:  Means of arrival:  Comments: ems- diarrhea, vomiting,

## 2014-03-19 NOTE — ED Notes (Signed)
Per EMS, pt states she has had diarrhea ever since returning home from hospital last Wednesday. Pt has had nausea, emesis, "pink" blood in stool, and 8/10 abdominal pain since yesterday afternoon. Pt was treated for pnuemonia last week, she was not able to fill her abx prescription. Pt given 4mg  zofran and 364ml NS via EMS.

## 2014-03-20 LAB — CBC
HEMATOCRIT: 29.5 % — AB (ref 36.0–46.0)
Hemoglobin: 9.2 g/dL — ABNORMAL LOW (ref 12.0–15.0)
MCH: 26.7 pg (ref 26.0–34.0)
MCHC: 31.2 g/dL (ref 30.0–36.0)
MCV: 85.5 fL (ref 78.0–100.0)
Platelets: 619 10*3/uL — ABNORMAL HIGH (ref 150–400)
RBC: 3.45 MIL/uL — ABNORMAL LOW (ref 3.87–5.11)
RDW: 16.9 % — ABNORMAL HIGH (ref 11.5–15.5)
WBC: 17.7 10*3/uL — ABNORMAL HIGH (ref 4.0–10.5)

## 2014-03-20 LAB — GI PATHOGEN PANEL BY PCR, STOOL
C DIFFICILE TOXIN A/B: NEGATIVE
Campylobacter by PCR: NEGATIVE
Cryptosporidium by PCR: NEGATIVE
E coli (ETEC) LT/ST: NEGATIVE
E coli (STEC): NEGATIVE
E coli 0157 by PCR: NEGATIVE
G lamblia by PCR: NEGATIVE
Norovirus GI/GII: POSITIVE
ROTAVIRUS A BY PCR: POSITIVE
SALMONELLA BY PCR: NEGATIVE
Shigella by PCR: NEGATIVE

## 2014-03-20 LAB — BASIC METABOLIC PANEL
BUN: 7 mg/dL (ref 6–23)
CHLORIDE: 101 meq/L (ref 96–112)
CO2: 26 mEq/L (ref 19–32)
CREATININE: 0.64 mg/dL (ref 0.50–1.10)
Calcium: 8.4 mg/dL (ref 8.4–10.5)
GFR calc Af Amer: >90 mL/min (ref >90)
GFR, EST NON AFRICAN AMERICAN: 85 mL/min — AB (ref >90)
Glucose, Bld: 120 mg/dL — ABNORMAL HIGH (ref 70–99)
Potassium: 3.5 mEq/L — ABNORMAL LOW (ref 3.7–5.3)
Sodium: 138 mEq/L (ref 137–147)

## 2014-03-20 LAB — GLUCOSE, CAPILLARY: Glucose-Capillary: 98 mg/dL (ref 70–99)

## 2014-03-20 MED ORDER — METOCLOPRAMIDE HCL 5 MG/ML IJ SOLN
5.0000 mg | Freq: Four times a day (QID) | INTRAMUSCULAR | Status: DC | PRN
  Administered 2014-03-20: 5 mg via INTRAVENOUS
  Filled 2014-03-20: qty 2

## 2014-03-20 NOTE — Progress Notes (Addendum)
PATIENT DETAILS Name: [name] Age: 78 y.o. Sex: female Date of Birth: 1936-06-25 Admit Date: 03/19/2014 Admitting Physician Evalee Mutton Kristeen Mans, MD IEP:PIRJJO,ACZYSA Danne Baxter, MD  Subjective: Claims to have decreased abd pain, diarrhea seems to have slowed down, no vomiting since last evening.  Assessment/Plan: Principal Problem:   Colitis -involving transverse colon-given recent admission and antibiotic use-initially C Diff Colitis suspected and started on oral Vanco, however, C Diff PCR neg-therefore now on Cipro/Flagy day 2. -Suspect infectious colitis-await Stool culture and GI pathogen panel -some clinical improvement-c/w supportive care and follow clinical course  Active Problems: . Mild hematochezia  - Suspect likely secondary to above other than a overt GI bleeding. Continue to monitor CBC. Hb stable -Per patient no further hematochezia today-continue to monitor-if any worsening will consult GI  . Anemia -Mild drop in Hb-close to usual levels from recent Hb-suspect was hemoconcentrated on admission-therefore Hb 10.7 on admit -anemia likely from acute/chronic illness rather than hematochezia  . COPD (chronic obstructive pulmonary disease)  - Stable, lungs clear. As needed nebulized bronchodilators -refuses scheduled nebs  . Hypertension  - Continue verapamil   . Chronic respiratory failure  - On home O2, continue   . Depression  - Continue Elavil and Paxil   Disposition: Remain inpatient  DVT Prophylaxis: SCD's  Code Status: Full code   Family Communication None at bedside  Procedures:  None  CONSULTS:  None  Time spent 40 minutes-which includes 50% of the time with face-to-face with patient/ family and coordinating care related to the above assessment and plan.    MEDICATIONS: Scheduled Meds: . amitriptyline  10 mg Oral Daily  . ciprofloxacin  400 mg Intravenous Q12H  . estrogens (conjugated)  1.25 mg Oral q morning - 10a  .  famotidine  20 mg Oral Daily  . metronidazole  500 mg Intravenous Q8H  . PARoxetine  20 mg Oral q morning - 10a  . saccharomyces boulardii  250 mg Oral BID  . sodium chloride  2 spray Each Nare Q2H while awake  . verapamil  120 mg Oral q morning - 10a   Continuous Infusions: . 0.9 % NaCl with KCl 20 mEq / L 100 mL/hr at 03/19/14 1954   PRN Meds:.acetaminophen, acetaminophen, albuterol, guaiFENesin-dextromethorphan, metoCLOPramide (REGLAN) injection, morphine injection, ondansetron (ZOFRAN) IV, ondansetron, oxyCODONE  Antibiotics: Anti-infectives   Start     Dose/Rate Route Frequency Ordered Stop   03/19/14 2000  ciprofloxacin (CIPRO) IVPB 400 mg     400 mg 200 mL/hr over 60 Minutes Intravenous Every 12 hours 03/19/14 1922     03/19/14 2000  metroNIDAZOLE (FLAGYL) IVPB 500 mg     500 mg 100 mL/hr over 60 Minutes Intravenous Every 8 hours 03/19/14 1922     03/19/14 1800  vancomycin (VANCOCIN) 50 mg/mL oral solution 125 mg  Status:  Discontinued     125 mg Oral 4 times per day 03/19/14 1640 03/19/14 1922       PHYSICAL EXAM: Vital signs in last 24 hours: Filed Vitals:   03/20/14 0500 03/20/14 0631 03/20/14 0739 03/20/14 0800  BP:  124/62 113/54   Pulse:  94 86   Temp:   97.9 F (36.6 C)   TempSrc:  Oral Oral   Resp:  16 16   Height:      Weight: 62.732 kg (138 lb 4.8 oz)     SpO2:  100% 100% 100%    Weight change:  [employer]  03/20/14 0500  Weight: 62.732 kg (138 lb 4.8 oz)   Body mass index is 27.92 kg/(m^2).   Gen Exam: Awake and alert with clear speech.   Neck: Supple, No JVD.   Chest: B/L Clear.   CVS: S1 S2 Regular, no murmurs.  Abdomen: soft, BS +, mildly tender in mid abdomen, but no rebound rigidity or gaurding  Extremities: no edema, lower extremities warm to touch. Neurologic: Non Focal.   Skin: No Rash.   Wounds: N/A.   Intake/Output from previous day: No intake or output data in the 24 hours ending 03/20/14 1252   LAB  RESULTS: CBC  Recent Labs Lab 03/19/14 1429 03/20/14 0446  WBC 23.2* 17.7*  HGB 10.7* 9.2*  HCT 34.0* 29.5*  PLT 692* 619*  MCV 84.4 85.5  MCH 26.6 26.7  MCHC 31.5 31.2  RDW 16.6* 16.9*  LYMPHSABS 1.8  --   MONOABS 0.7  --   EOSABS 0.0  --   BASOSABS 0.0  --     Chemistries   Recent Labs Lab 03/19/14 1429 03/20/14 0446  NA 137 138  K 3.8 3.5*  CL 100 101  CO2 23 26  GLUCOSE 123* 120*  BUN 9 7  CREATININE 0.52 0.64  CALCIUM 8.7 8.4    CBG:  Recent Labs Lab 03/20/14 0728  GLUCAP 98    GFR Estimated Creatinine Clearance: 48.9 ml/min (by C-G formula based on Cr of 0.64).  Coagulation profile No results found for this basename: INR, PROTIME,  in the last 168 hours  Cardiac Enzymes No results found for this basename: CK, CKMB, TROPONINI, MYOGLOBIN,  in the last 168 hours  No components found with this basename: POCBNP,  No results found for this basename: DDIMER,  in the last 72 hours No results found for this basename: HGBA1C,  in the last 72 hours No results found for this basename: CHOL, HDL, LDLCALC, TRIG, CHOLHDL, LDLDIRECT,  in the last 72 hours No results found for this basename: TSH, T4TOTAL, FREET3, T3FREE, THYROIDAB,  in the last 72 hours No results found for this basename: VITAMINB12, FOLATE, FERRITIN, TIBC, IRON, RETICCTPCT,  in the last 72 hours No results found for this basename: LIPASE, AMYLASE,  in the last 72 hours  Urine Studies No results found for this basename: UACOL, UAPR, USPG, UPH, UTP, UGL, UKET, UBIL, UHGB, UNIT, UROB, ULEU, UEPI, UWBC, URBC, UBAC, CAST, CRYS, UCOM, BILUA,  in the last 72 hours  MICROBIOLOGY: Recent Results (from the past 240 hour(s))  CLOSTRIDIUM DIFFICILE BY PCR     Status: None   Collection Time    03/19/14  3:00 PM      Result Value Ref Range Status   C difficile by pcr NEGATIVE  NEGATIVE Final   Comment: Performed at [facility]     Status: None   Collection Time    03/19/14   8:58 PM      Result Value Ref Range Status   Specimen Description STOOL   Final   Special Requests NONE   Final   Culture     Final   Value: Culture reincubated for better growth     Performed at [facility]   Report Status PENDING   Incomplete    RADIOLOGY STUDIES/RESULTS: Ct Abdomen Pelvis W Contrast  03/19/2014   CLINICAL DATA:  Diarrhea.  EXAM: CT ABDOMEN AND PELVIS WITH CONTRAST  TECHNIQUE: Multidetector CT imaging of the abdomen and pelvis was performed using the standard  protocol following bolus administration of intravenous contrast.  CONTRAST:  162mL OMNIPAQUE IOHEXOL 300 MG/ML  SOLN  COMPARISON:  03/19/2014 plain film exam.  06/12/2013 CT.  FINDINGS: Diffuse colitis predominately involving the transverse colon although with extension into the proximal ascending colon. No free intraperitoneal air or pneumatosis. Free fluid within the pelvis most likely related to the colitis. Source of the colitis is indeterminate whether from infectious, inflammatory or ischemic etiology.  Major vascular structures are patent. There are significant atherosclerotic type changes of the abdominal aorta and branch vessels. Ectatic abdominal aorta measuring up to 1.9 cm. Narrowing of the common iliac arteries.  Elongated liver spanning over 19.2 cm with slightly lobulated contour and mildly widened fissures but without definitive findings of cirrhosis and appearance unchanged.  No calcified gallstones. No focal splenic, pancreatic or adrenal abnormality.  Sub cm right renal low-density structure too small to characterize and without significant change (possibly a small cyst). 5 mm and 3 mm nonobstructing left lower pole renal calculi. Extra renal type pelvis more notable on the left.  Fatty containing periumbilical hernia. Fatty and vessel containing left Spigelian hernia.  Remote fracture T11 and L3 treated with cement augmentation. Cement augmentation of the sacrum. No new compression fracture noted.   Heart slightly enlarged.  Non contrast filled views of the urinary bladder unremarkable.  Post hysterectomy.  Left ovarian cyst slightly larger than on the prior examination with maximal dimension of 2.8 cm versus prior 2.4 cm. This can be assessed with elective pelvic sonogram. .  IMPRESSION: Diffuse colitis predominantly involving transverse colon. Please see above for further detail and additional findings.  These results were called by telephone at the time of interpretation on 03/19/2014 at 4:20 PM to Dr. Mali Shelton , who verbally acknowledged these results.   Electronically Signed   By: Chauncey Cruel M.D.   On: 03/19/2014 16:35   Dg Chest Portable 1 View  03/10/2014   CLINICAL DATA:  Hypoxia  EXAM: PORTABLE CHEST - 1 VIEW  COMPARISON:  May 08, 2008  FINDINGS: There is left base infiltrate. There is underlying emphysema. Elsewhere lungs are clear. Heart is upper normal in size with normal pulmonary vascularity. No adenopathy.  IMPRESSION: Left base infiltrate.  Underlying emphysema.   Electronically Signed   By: Lowella Grip M.D.   On: 03/10/2014 19:56   Dg Abd Acute W/chest  03/19/2014   CLINICAL DATA:  Abdominal pain.  EXAM: ACUTE ABDOMEN SERIES (ABDOMEN 2 VIEW & CHEST 1 VIEW)  COMPARISON:  03/10/2014 chest x-ray. CT abdomen pelvis performed same day.  FINDINGS: Lung parenchymal scarring suspected without segmental infiltrate, pulmonary edema, or pneumothorax.  Fullness in the aortic pulmonary window region. It is possible this is related to technique however, followup two view chest or chest CT may be considered for further delineation.  Abnormal bowel gas pattern better demonstrated on the accompanying CT which reveals diffuse colitis predominantly involving the transverse colon without evidence of free intraperitoneal air.  Remote cement augmentation L3, T11 and bilateral upper sacrum.  IMPRESSION: Abnormal bowel gas pattern with transverse colon colitis better demonstrated on CT performed same  day.  Fullness of the aorta pulmonary window region may be projectional but will require followup two view chest or chest CT to help exclude mass/adenopathy.   Electronically Signed   By: Chauncey Cruel M.D.   On: 03/19/2014 16:45    Oren Binet, MD  [facility] Pager:[phone]  If 7PM-7AM, please contact night-coverage [email or web address] Password [facility] 03/20/2014, 12:52 PM  LOS: 1 day

## 2014-03-21 DIAGNOSIS — D649 Anemia, unspecified: Secondary | ICD-10-CM

## 2014-03-21 LAB — CBC
HCT: 28 % — ABNORMAL LOW (ref 36.0–46.0)
Hemoglobin: 8 g/dL — ABNORMAL LOW (ref 12.0–15.0)
MCH: 25.2 pg — ABNORMAL LOW (ref 26.0–34.0)
MCHC: 28.6 g/dL — ABNORMAL LOW (ref 30.0–36.0)
MCV: 88.3 fL (ref 78.0–100.0)
Platelets: 553 K/uL — ABNORMAL HIGH (ref 150–400)
RBC: 3.17 MIL/uL — ABNORMAL LOW (ref 3.87–5.11)
RDW: 16.9 % — ABNORMAL HIGH (ref 11.5–15.5)
WBC: 9.2 K/uL (ref 4.0–10.5)

## 2014-03-21 LAB — BASIC METABOLIC PANEL
BUN: 6 mg/dL (ref 6–23)
CO2: 26 meq/L (ref 19–32)
Calcium: 8.5 mg/dL (ref 8.4–10.5)
Chloride: 103 mEq/L (ref 96–112)
Creatinine, Ser: 0.59 mg/dL (ref 0.50–1.10)
GFR calc Af Amer: >90 mL/min (ref >90)
GFR calc non Af Amer: 87 mL/min — ABNORMAL LOW (ref >90)
Glucose, Bld: 105 mg/dL — ABNORMAL HIGH (ref 70–99)
Potassium: 3.9 mEq/L (ref 3.7–5.3)
Sodium: 139 mEq/L (ref 137–147)

## 2014-03-21 LAB — GLUCOSE, CAPILLARY: Glucose-Capillary: 103 mg/dL — ABNORMAL HIGH (ref 70–99)

## 2014-03-21 MED ORDER — OXYCODONE HCL 5 MG PO TABS: 5.0000 mg | ORAL_TABLET | ORAL | Status: DC | PRN

## 2014-03-21 MED ORDER — ONDANSETRON HCL 4 MG PO TABS: 4.0000 mg | ORAL_TABLET | Freq: Four times a day (QID) | ORAL | Status: DC | PRN

## 2014-03-21 NOTE — Progress Notes (Signed)
Notified that pt needed an oxygen tank for travel home. I called Lecretia with [facility] and requested a tank.   Allene Dillon RN BSN   [phone]

## 2014-03-21 NOTE — Progress Notes (Signed)
Overheard nurse Ene, RN stating that patient called her a "dumbass" and that she decided to just walk away. Kizzy, NT states that she overheard her.  I approached patient, asked how she was doing and if she had indeed had a not so positive interaction with one of the nurses. The patient stated no. I then asked her if she had called anyone a bad name. She stated "Who said that?", I didn't say anything. I replied that it was "an expectation that we treat each other with respect. I expect that my nurse would respect her and that she'd do the same to my nurse". Patient become upset and stated loudly to "get on out of my face", "y'all are trying to start something", "you don't tell me". Patient stated that she would "call back here and handle things" (after her discharge home). I gave her my business card and let her know that this would be fine. She stated, she had a ride waiting on her downstairs. I replied, ok and stated that I hope she does well at home and have a nice day. Pt. left via wheelchair mumbling negative comments to me and about the staff. Kizzy, NT took patient to awaiting vehicle in stable condition.

## 2014-03-21 NOTE — Discharge Summary (Signed)
Physician Discharge Summary  [name] VEH:[id number] DOB: 1936/04/04 DOA: 03/19/2014  PCP: Adriana Downing, MD  Admit date: 03/19/2014 Discharge date: 03/21/2014  Time spent: 45 minutes  Recommendations for Outpatient Follow-up:  -Will be discharged home today. -Advised to follow up with her PCP in 2 weeks.   Discharge Diagnoses:  Principal Problem:   Colitis Active Problems:   Hypertension   COPD (chronic obstructive pulmonary disease)   Tobacco abuse   Chronic respiratory failure   Depression   Discharge Condition: Stable and improved  Filed Weights   03/20/14 0500 03/21/14 0659  Weight: 62.732 kg (138 lb 4.8 oz) 64.003 kg (141 lb 1.6 oz)    History of present illness:  [name] is a 78 y.o. female recently hospitalized and was discharged last week with a Past Medical History COPD with chronic respiratory failure on home O2, hypertension who presents today with the above noted complaint. Patient since discharge from the hospital last week she was doing relatively okay, she did not fill the antibiotic she was prescribed. Yesterday she started having bilateral lower abdominal pain, which he describes as crampy, 10/10 at its worst without any radiation. She denies any associated fever. She also then started to have nausea with vomiting. Patient claims to have vomited numerous times yesterday and numerous times today as well. No blood in the vomitus. Patient claims to have had numerous episodes of loose watery stools, she claims that today she has noticed some frank blood in her stools. She was brought to the emergency room, CBC showed a white count of 23,000, CT of the abdomen shows transverse colitis. Admission was requested  Hospital Course:   Acute viral gastroenteritis/colitis -Stool pathogen panel positive for rotavirus and norovirus. -No need for antibiotics, they have been discontinued. -Patient is symptomatically improved, no longer has diarrhea, no nausea or  vomiting. -She has tolerated solid meals without issues.  COPD, without acute exacerbation -Stable. Continue home oxygen and when necessary nebulizers.  Depression -Continue Elavil and Paxil  Hypertension -Well controlled. -Continue verapamil  Procedures:  None   Consultations:  None  Discharge Instructions  Discharge Orders   Future Orders Complete By Expires   Discontinue IV  As directed    Increase activity slowly  As directed        Medication List    STOP taking these medications       clindamycin 300 MG capsule  Commonly known as:  CLEOCIN     furosemide 40 MG tablet  Commonly known as:  LASIX      TAKE these medications       amitriptyline 10 MG tablet  Commonly known as:  ELAVIL  Take 10 mg by mouth daily.     DULoxetine 60 MG capsule  Commonly known as:  CYMBALTA  Take 60 mg by mouth daily.     estrogens (conjugated) 1.25 MG tablet  Commonly known as:  PREMARIN  Take 1.25 mg by mouth every morning.     omeprazole 40 MG capsule  Commonly known as:  PRILOSEC  Take 40 mg by mouth every morning.     ondansetron 4 MG tablet  Commonly known as:  ZOFRAN  Take 1 tablet (4 mg total) by mouth every 6 (six) hours as needed for nausea.     oxyCODONE 5 MG immediate release tablet  Commonly known as:  Oxy IR/ROXICODONE  Take 1 tablet (5 mg total) by mouth every 4 (four) hours as needed for moderate pain.  PARoxetine 20 MG tablet  Commonly known as:  PAXIL  Take 20 mg by mouth every morning.     saccharomyces boulardii 250 MG capsule  Commonly known as:  FLORASTOR  Take 1 capsule (250 mg total) by mouth 2 (two) times daily.     sodium chloride 0.65 % Soln nasal spray  Commonly known as:  OCEAN  Place 2 sprays into both nostrils every 2 (two) hours while awake.     verapamil 120 MG CR tablet  Commonly known as:  CALAN-SR  Take 120 mg by mouth every morning.       No Known Allergies     Follow-up Information   Follow up with  Adriana Downing, MD. Schedule an appointment as soon as possible for a visit in 2 weeks.   Specialty:  Family Medicine   Contact information:   [address] [place] [address] [phone]        The results of significant diagnostics from this hospitalization (including imaging, microbiology, ancillary and laboratory) are listed below for reference.    Significant Diagnostic Studies: Ct Abdomen Pelvis W Contrast  03/19/2014   CLINICAL DATA:  Diarrhea.  EXAM: CT ABDOMEN AND PELVIS WITH CONTRAST  TECHNIQUE: Multidetector CT imaging of the abdomen and pelvis was performed using the standard protocol following bolus administration of intravenous contrast.  CONTRAST:  155mL OMNIPAQUE IOHEXOL 300 MG/ML  SOLN  COMPARISON:  03/19/2014 plain film exam.  06/12/2013 CT.  FINDINGS: Diffuse colitis predominately involving the transverse colon although with extension into the proximal ascending colon. No free intraperitoneal air or pneumatosis. Free fluid within the pelvis most likely related to the colitis. Source of the colitis is indeterminate whether from infectious, inflammatory or ischemic etiology.  Major vascular structures are patent. There are significant atherosclerotic type changes of the abdominal aorta and branch vessels. Ectatic abdominal aorta measuring up to 1.9 cm. Narrowing of the common iliac arteries.  Elongated liver spanning over 19.2 cm with slightly lobulated contour and mildly widened fissures but without definitive findings of cirrhosis and appearance unchanged.  No calcified gallstones. No focal splenic, pancreatic or adrenal abnormality.  Sub cm right renal low-density structure too small to characterize and without significant change (possibly a small cyst). 5 mm and 3 mm nonobstructing left lower pole renal calculi. Extra renal type pelvis more notable on the left.  Fatty containing periumbilical hernia. Fatty and vessel containing left Spigelian hernia.  Remote  fracture T11 and L3 treated with cement augmentation. Cement augmentation of the sacrum. No new compression fracture noted.  Heart slightly enlarged.  Non contrast filled views of the urinary bladder unremarkable.  Post hysterectomy.  Left ovarian cyst slightly larger than on the prior examination with maximal dimension of 2.8 cm versus prior 2.4 cm. This can be assessed with elective pelvic sonogram. .  IMPRESSION: Diffuse colitis predominantly involving transverse colon. Please see above for further detail and additional findings.  These results were called by telephone at the time of interpretation on 03/19/2014 at 4:20 PM to Dr. Mali Shelton , who verbally acknowledged these results.   Electronically Signed   By: Chauncey Cruel M.D.   On: 03/19/2014 16:35   Dg Chest Portable 1 View  03/10/2014   CLINICAL DATA:  Hypoxia  EXAM: PORTABLE CHEST - 1 VIEW  COMPARISON:  May 08, 2008  FINDINGS: There is left base infiltrate. There is underlying emphysema. Elsewhere lungs are clear. Heart is upper normal in size with normal pulmonary vascularity. No adenopathy.  IMPRESSION: Left base infiltrate.  Underlying emphysema.   Electronically Signed   By: Lowella Grip M.D.   On: 03/10/2014 19:56   Dg Abd Acute W/chest  03/19/2014   CLINICAL DATA:  Abdominal pain.  EXAM: ACUTE ABDOMEN SERIES (ABDOMEN 2 VIEW & CHEST 1 VIEW)  COMPARISON:  03/10/2014 chest x-ray. CT abdomen pelvis performed same day.  FINDINGS: Lung parenchymal scarring suspected without segmental infiltrate, pulmonary edema, or pneumothorax.  Fullness in the aortic pulmonary window region. It is possible this is related to technique however, followup two view chest or chest CT may be considered for further delineation.  Abnormal bowel gas pattern better demonstrated on the accompanying CT which reveals diffuse colitis predominantly involving the transverse colon without evidence of free intraperitoneal air.  Remote cement augmentation L3, T11 and bilateral  upper sacrum.  IMPRESSION: Abnormal bowel gas pattern with transverse colon colitis better demonstrated on CT performed same day.  Fullness of the aorta pulmonary window region may be projectional but will require followup two view chest or chest CT to help exclude mass/adenopathy.   Electronically Signed   By: Chauncey Cruel M.D.   On: 03/19/2014 16:45    Microbiology: Recent Results (from the past 240 hour(s))  CLOSTRIDIUM DIFFICILE BY PCR     Status: None   Collection Time    03/19/14  3:00 PM      Result Value Ref Range Status   C difficile by pcr NEGATIVE  NEGATIVE Final   Comment: Performed at [facility]     Status: None   Collection Time    03/19/14  8:58 PM      Result Value Ref Range Status   Specimen Description STOOL   Final   Special Requests NONE   Final   Culture     Final   Value: NO SUSPICIOUS COLONIES, CONTINUING TO HOLD     Performed at [facility]   Report Status PENDING   Incomplete     Labs: Basic Metabolic Panel:  Recent Labs Lab 03/19/14 1429 03/20/14 0446 03/21/14 0422  NA 137 138 139  K 3.8 3.5* 3.9  CL 100 101 103  CO2 23 26 26   GLUCOSE 123* 120* 105*  BUN 9 7 6   CREATININE 0.52 0.64 0.59  CALCIUM 8.7 8.4 8.5   Liver Function Tests:  Recent Labs Lab 03/19/14 1429  AST 12  ALT 10  ALKPHOS 74  BILITOT <0.2*  PROT 6.9  ALBUMIN 3.1*   No results found for this basename: LIPASE, AMYLASE,  in the last 168 hours No results found for this basename: AMMONIA,  in the last 168 hours CBC:  Recent Labs Lab 03/19/14 1429 03/20/14 0446 03/21/14 0422  WBC 23.2* 17.7* 9.2  NEUTROABS 20.6*  --   --   HGB 10.7* 9.2* 8.0*  HCT 34.0* 29.5* 28.0*  MCV 84.4 85.5 88.3  PLT 692* 619* 553*   Cardiac Enzymes: No results found for this basename: CKTOTAL, CKMB, CKMBINDEX, TROPONINI,  in the last 168 hours BNP: BNP (last 3 results) No results found for this basename: PROBNP,  in the last 8760 hours CBG:  Recent  Labs Lab 03/20/14 0728 03/21/14 0822  GLUCAP 98 103*       Signed:  HERNANDEZ ACOSTA,ESTELA  [facility] Pager: [phone] 03/21/2014, 3:57 PM

## 2014-03-23 LAB — STOOL CULTURE

## 2014-04-16 ENCOUNTER — Other Ambulatory Visit (HOSPITAL_COMMUNITY): Payer: Self-pay | Admitting: Physician Assistant

## 2014-04-16 ENCOUNTER — Ambulatory Visit (HOSPITAL_COMMUNITY)
Admission: RE | Admit: 2014-04-16 | Discharge: 2014-04-16 | Disposition: A | Discharge status: Home / Self Care | Payer: Medicare Other | Source: Ambulatory Visit | Attending: Physician Assistant | Admitting: Physician Assistant

## 2014-04-16 DIAGNOSIS — R9389 Abnormal findings on diagnostic imaging of other specified body structures: Secondary | ICD-10-CM

## 2014-04-16 DIAGNOSIS — R0602 Shortness of breath: Secondary | ICD-10-CM | POA: Insufficient documentation

## 2014-04-16 DIAGNOSIS — R059 Cough, unspecified: Secondary | ICD-10-CM | POA: Insufficient documentation

## 2014-04-16 DIAGNOSIS — R05 Cough: Secondary | ICD-10-CM | POA: Insufficient documentation

## 2015-12-19 ENCOUNTER — Emergency Department (HOSPITAL_COMMUNITY): Payer: Medicare Other

## 2015-12-19 ENCOUNTER — Inpatient Hospital Stay (HOSPITAL_COMMUNITY): Payer: Medicare Other

## 2015-12-19 ENCOUNTER — Encounter (HOSPITAL_COMMUNITY): Payer: Self-pay

## 2015-12-19 ENCOUNTER — Inpatient Hospital Stay (HOSPITAL_COMMUNITY)
Admission: EM | Admit: 2015-12-19 | Discharge: 2015-12-26 | DRG: 871 | Disposition: A | Discharge status: Home Health | Payer: Medicare Other | Attending: Internal Medicine | Admitting: Internal Medicine

## 2015-12-19 DIAGNOSIS — D649 Anemia, unspecified: Secondary | ICD-10-CM | POA: Diagnosis present

## 2015-12-19 DIAGNOSIS — J189 Pneumonia, unspecified organism: Secondary | ICD-10-CM | POA: Diagnosis present

## 2015-12-19 DIAGNOSIS — F329 Major depressive disorder, single episode, unspecified: Secondary | ICD-10-CM | POA: Diagnosis present

## 2015-12-19 DIAGNOSIS — F419 Anxiety disorder, unspecified: Secondary | ICD-10-CM | POA: Diagnosis present

## 2015-12-19 DIAGNOSIS — G8929 Other chronic pain: Secondary | ICD-10-CM | POA: Diagnosis present

## 2015-12-19 DIAGNOSIS — J440 Chronic obstructive pulmonary disease with acute lower respiratory infection: Secondary | ICD-10-CM | POA: Diagnosis present

## 2015-12-19 DIAGNOSIS — I159 Secondary hypertension, unspecified: Secondary | ICD-10-CM

## 2015-12-19 DIAGNOSIS — I10 Essential (primary) hypertension: Secondary | ICD-10-CM | POA: Diagnosis present

## 2015-12-19 DIAGNOSIS — M810 Age-related osteoporosis without current pathological fracture: Secondary | ICD-10-CM | POA: Diagnosis present

## 2015-12-19 DIAGNOSIS — A419 Sepsis, unspecified organism: Secondary | ICD-10-CM

## 2015-12-19 DIAGNOSIS — J9620 Acute and chronic respiratory failure, unspecified whether with hypoxia or hypercapnia: Secondary | ICD-10-CM | POA: Diagnosis present

## 2015-12-19 DIAGNOSIS — G9340 Encephalopathy, unspecified: Secondary | ICD-10-CM | POA: Diagnosis present

## 2015-12-19 DIAGNOSIS — J449 Chronic obstructive pulmonary disease, unspecified: Secondary | ICD-10-CM | POA: Diagnosis present

## 2015-12-19 DIAGNOSIS — R0602 Shortness of breath: Secondary | ICD-10-CM | POA: Diagnosis not present

## 2015-12-19 DIAGNOSIS — Z9981 Dependence on supplemental oxygen: Secondary | ICD-10-CM

## 2015-12-19 DIAGNOSIS — Z87891 Personal history of nicotine dependence: Secondary | ICD-10-CM

## 2015-12-19 DIAGNOSIS — F19921 Other psychoactive substance use, unspecified with intoxication with delirium: Secondary | ICD-10-CM

## 2015-12-19 DIAGNOSIS — G92 Toxic encephalopathy: Secondary | ICD-10-CM | POA: Diagnosis not present

## 2015-12-19 DIAGNOSIS — J9621 Acute and chronic respiratory failure with hypoxia: Secondary | ICD-10-CM | POA: Diagnosis not present

## 2015-12-19 DIAGNOSIS — Z79899 Other long term (current) drug therapy: Secondary | ICD-10-CM

## 2015-12-19 DIAGNOSIS — T50905A Adverse effect of unspecified drugs, medicaments and biological substances, initial encounter: Secondary | ICD-10-CM | POA: Diagnosis present

## 2015-12-19 DIAGNOSIS — J9601 Acute respiratory failure with hypoxia: Secondary | ICD-10-CM | POA: Diagnosis present

## 2015-12-19 HISTORY — DX: Noninfective gastroenteritis and colitis, unspecified: K52.9

## 2015-12-19 LAB — LACTIC ACID, PLASMA
LACTIC ACID, VENOUS: 4.4 mmol/L — AB (ref 0.5–2.0)
LACTIC ACID, VENOUS: 3.6 mmol/L — AB (ref 0.5–2.0)

## 2015-12-19 LAB — BLOOD GAS, ARTERIAL
Acid-Base Excess: 8.4 mmol/L — ABNORMAL HIGH (ref 0.0–2.0)
BICARBONATE: 34.7 meq/L — AB (ref 20.0–24.0)
DRAWN BY: 295031
O2 CONTENT: 3.5 L/min
O2 SAT: 91.8 %
Patient temperature: 98.6
TCO2: 32.1 mmol/L (ref 0–100)
pCO2 arterial: 60.2 mmHg (ref 35.0–45.0)
pH, Arterial: 7.379 (ref 7.350–7.450)
pO2, Arterial: 64.9 mmHg — ABNORMAL LOW (ref 80.0–100.0)

## 2015-12-19 LAB — CBC WITH DIFFERENTIAL/PLATELET
BASOS PCT: 0 %
Basophils Absolute: 0 10*3/uL (ref 0.0–0.1)
Basophils Absolute: 0 10*3/uL (ref 0.0–0.1)
Basophils Relative: 0 %
Eosinophils Absolute: 0.1 10*3/uL (ref 0.0–0.7)
Eosinophils Absolute: 0 10*3/uL (ref 0.0–0.7)
Eosinophils Relative: 1 %
Eosinophils Relative: 0 %
HEMATOCRIT: 37.5 % (ref 36.0–46.0)
HEMATOCRIT: 33.4 % — AB (ref 36.0–46.0)
Hemoglobin: 11.3 g/dL — ABNORMAL LOW (ref 12.0–15.0)
Hemoglobin: 10.1 g/dL — ABNORMAL LOW (ref 12.0–15.0)
LYMPHS ABS: 0.5 10*3/uL — AB (ref 0.7–4.0)
Lymphocytes Relative: 13 %
Lymphocytes Relative: 2 %
Lymphs Abs: 2.6 10*3/uL (ref 0.7–4.0)
MCH: 28.8 pg (ref 26.0–34.0)
MCH: 28.1 pg (ref 26.0–34.0)
MCHC: 30.1 g/dL (ref 30.0–36.0)
MCHC: 30.2 g/dL (ref 30.0–36.0)
MCV: 95.4 fL (ref 78.0–100.0)
MCV: 93 fL (ref 78.0–100.0)
MONO ABS: 1.5 10*3/uL — AB (ref 0.1–1.0)
MONO ABS: 0.5 10*3/uL (ref 0.1–1.0)
MONOS PCT: 7 %
MONOS PCT: 2 %
NEUTROS ABS: 15.8 10*3/uL — AB (ref 1.7–7.7)
NEUTROS PCT: 96 %
Neutro Abs: 21.6 10*3/uL — ABNORMAL HIGH (ref 1.7–7.7)
Neutrophils Relative %: 79 %
Platelets: 493 10*3/uL — ABNORMAL HIGH (ref 150–400)
Platelets: 489 10*3/uL — ABNORMAL HIGH (ref 150–400)
RBC: 3.93 MIL/uL (ref 3.87–5.11)
RBC: 3.59 MIL/uL — ABNORMAL LOW (ref 3.87–5.11)
RDW: 13.5 % (ref 11.5–15.5)
RDW: 13.4 % (ref 11.5–15.5)
WBC: 19.9 10*3/uL — ABNORMAL HIGH (ref 4.0–10.5)
WBC: 22.7 10*3/uL — ABNORMAL HIGH (ref 4.0–10.5)

## 2015-12-19 LAB — PROCALCITONIN: PROCALCITONIN: 0.11 ng/mL

## 2015-12-19 LAB — PHOSPHORUS: PHOSPHORUS: 3.9 mg/dL (ref 2.5–4.6)

## 2015-12-19 LAB — PROTIME-INR
INR: 1.2 (ref 0.00–1.49)
PROTHROMBIN TIME: 15.3 s — AB (ref 11.6–15.2)

## 2015-12-19 LAB — COMPREHENSIVE METABOLIC PANEL
ALBUMIN: 3.3 g/dL — AB (ref 3.5–5.0)
ALBUMIN: 3.1 g/dL — AB (ref 3.5–5.0)
ALT: 22 U/L (ref 14–54)
ALT: 22 U/L (ref 14–54)
ANION GAP: 15 (ref 5–15)
ANION GAP: 17 — AB (ref 5–15)
AST: 23 U/L (ref 15–41)
AST: 27 U/L (ref 15–41)
Alkaline Phosphatase: 81 U/L (ref 38–126)
Alkaline Phosphatase: 76 U/L (ref 38–126)
BILIRUBIN TOTAL: 0.7 mg/dL (ref 0.3–1.2)
BILIRUBIN TOTAL: 0.2 mg/dL — AB (ref 0.3–1.2)
BUN: 22 mg/dL — ABNORMAL HIGH (ref 6–20)
BUN: 22 mg/dL — ABNORMAL HIGH (ref 6–20)
CALCIUM: 9.1 mg/dL (ref 8.9–10.3)
CO2: 37 mmol/L — ABNORMAL HIGH (ref 22–32)
CO2: 34 mmol/L — ABNORMAL HIGH (ref 22–32)
Calcium: 9.5 mg/dL (ref 8.9–10.3)
Chloride: 88 mmol/L — ABNORMAL LOW (ref 101–111)
Chloride: 85 mmol/L — ABNORMAL LOW (ref 101–111)
Creatinine, Ser: 0.75 mg/dL (ref 0.44–1.00)
Creatinine, Ser: 0.87 mg/dL (ref 0.44–1.00)
GFR calc Af Amer: >60 mL/min (ref >60)
GLUCOSE: 186 mg/dL — AB (ref 65–99)
Glucose, Bld: 383 mg/dL — ABNORMAL HIGH (ref 65–99)
POTASSIUM: 3.9 mmol/L (ref 3.5–5.1)
POTASSIUM: 3.9 mmol/L (ref 3.5–5.1)
Sodium: 140 mmol/L (ref 135–145)
Sodium: 136 mmol/L (ref 135–145)
TOTAL PROTEIN: 7.9 g/dL (ref 6.5–8.1)
Total Protein: 8.5 g/dL — ABNORMAL HIGH (ref 6.5–8.1)

## 2015-12-19 LAB — I-STAT TROPONIN, ED: TROPONIN I, POC: 0 ng/mL (ref 0.00–0.08)

## 2015-12-19 LAB — MAGNESIUM: Magnesium: 2.2 mg/dL (ref 1.7–2.4)

## 2015-12-19 LAB — APTT: aPTT: 30 seconds (ref 24–37)

## 2015-12-19 LAB — LIPASE, BLOOD: LIPASE: 26 U/L (ref 11–51)

## 2015-12-19 LAB — MRSA PCR SCREENING: MRSA by PCR: NEGATIVE

## 2015-12-19 MED ORDER — DEXTROSE 5 % IV SOLN: 1.0000 g | Freq: Once | INTRAVENOUS | Status: DC

## 2015-12-19 MED ORDER — ONDANSETRON HCL 4 MG PO TABS: 4.0000 mg | ORAL_TABLET | Freq: Four times a day (QID) | ORAL | Status: DC | PRN

## 2015-12-19 MED ORDER — VERAPAMIL HCL ER 120 MG PO TBCR
120.0000 mg | EXTENDED_RELEASE_TABLET | Freq: Every morning | ORAL | Status: DC
Start: 2015-12-19 — End: 2015-12-26
  Administered 2015-12-19 – 2015-12-26 (×8): 120 mg via ORAL
  Filled 2015-12-19 (×8): qty 1

## 2015-12-19 MED ORDER — ONDANSETRON HCL 4 MG/2ML IJ SOLN
4.0000 mg | Freq: Four times a day (QID) | INTRAMUSCULAR | Status: DC | PRN
  Administered 2015-12-19 – 2015-12-22 (×3): 4 mg via INTRAVENOUS
  Filled 2015-12-19 (×3): qty 2

## 2015-12-19 MED ORDER — DULOXETINE HCL 60 MG PO CPEP
60.0000 mg | ORAL_CAPSULE | Freq: Every day | ORAL | Status: DC
  Administered 2015-12-19 – 2015-12-26 (×8): 60 mg via ORAL
  Filled 2015-12-19: qty 1
  Filled 2015-12-19: qty 2
  Filled 2015-12-19: qty 1
  Filled 2015-12-19: qty 2
  Filled 2015-12-19 (×4): qty 1

## 2015-12-19 MED ORDER — HEPARIN SODIUM (PORCINE) 5000 UNIT/ML IJ SOLN
5000.0000 [IU] | Freq: Three times a day (TID) | INTRAMUSCULAR | Status: DC
  Administered 2015-12-19 – 2015-12-26 (×21): 5000 [IU] via SUBCUTANEOUS
  Filled 2015-12-19 (×24): qty 1

## 2015-12-19 MED ORDER — METHYLPREDNISOLONE SODIUM SUCC 40 MG IJ SOLR
30.0000 mg | Freq: Two times a day (BID) | INTRAMUSCULAR | Status: DC
  Administered 2015-12-19 – 2015-12-20 (×2): 30 mg via INTRAVENOUS
  Filled 2015-12-19 (×2): qty 1

## 2015-12-19 MED ORDER — ACETAMINOPHEN 325 MG PO TABS
650.0000 mg | ORAL_TABLET | Freq: Four times a day (QID) | ORAL | Status: DC | PRN
  Administered 2015-12-23: 650 mg via ORAL
  Filled 2015-12-19: qty 2

## 2015-12-19 MED ORDER — SODIUM CHLORIDE 0.9 % IJ SOLN: 3.0000 mL | INTRAMUSCULAR | Status: DC | PRN

## 2015-12-19 MED ORDER — DEXTROSE 5 % IV SOLN
500.0000 mg | INTRAVENOUS | Status: DC
  Administered 2015-12-19: 500 mg via INTRAVENOUS
  Filled 2015-12-19: qty 500

## 2015-12-19 MED ORDER — ACETAMINOPHEN 650 MG RE SUPP: 650.0000 mg | Freq: Four times a day (QID) | RECTAL | Status: DC | PRN

## 2015-12-19 MED ORDER — IPRATROPIUM-ALBUTEROL 0.5-2.5 (3) MG/3ML IN SOLN
3.0000 mL | Freq: Four times a day (QID) | RESPIRATORY_TRACT | Status: DC
  Administered 2015-12-19 – 2015-12-24 (×14): 3 mL via RESPIRATORY_TRACT
  Filled 2015-12-19 (×16): qty 3

## 2015-12-19 MED ORDER — MORPHINE SULFATE (PF) 2 MG/ML IV SOLN
1.0000 mg | INTRAVENOUS | Status: DC | PRN
  Administered 2015-12-19 – 2015-12-23 (×10): 1 mg via INTRAVENOUS
  Filled 2015-12-19 (×10): qty 1

## 2015-12-19 MED ORDER — KETOROLAC TROMETHAMINE 30 MG/ML IJ SOLN
30.0000 mg | Freq: Once | INTRAMUSCULAR | Status: AC
  Administered 2015-12-19: 30 mg via INTRAVENOUS
  Filled 2015-12-19: qty 1

## 2015-12-19 MED ORDER — SODIUM CHLORIDE 0.9 % IV SOLN: 250.0000 mL | INTRAVENOUS | Status: DC | PRN

## 2015-12-19 MED ORDER — DEXTROSE 5 % IV SOLN: 500.0000 mg | Freq: Once | INTRAVENOUS | Status: DC

## 2015-12-19 MED ORDER — DEXTROSE 5 % IV SOLN
500.0000 mg | INTRAVENOUS | Status: DC
  Administered 2015-12-20 – 2015-12-22 (×3): 500 mg via INTRAVENOUS
  Filled 2015-12-19 (×4): qty 500

## 2015-12-19 MED ORDER — DEXTROSE 5 % IV SOLN
1.0000 g | INTRAVENOUS | Status: DC
  Administered 2015-12-19: 1 g via INTRAVENOUS
  Filled 2015-12-19: qty 10

## 2015-12-19 MED ORDER — SODIUM CHLORIDE 0.9 % IV SOLN
INTRAVENOUS | Status: DC
  Administered 2015-12-19 (×2): via INTRAVENOUS
  Administered 2015-12-20: 75 mL/h via INTRAVENOUS

## 2015-12-19 MED ORDER — SODIUM CHLORIDE 0.9 % IJ SOLN
3.0000 mL | Freq: Two times a day (BID) | INTRAMUSCULAR | Status: DC
  Administered 2015-12-20 – 2015-12-25 (×4): 3 mL via INTRAVENOUS

## 2015-12-19 MED ORDER — DEXTROSE 5 % IV SOLN
1.0000 g | INTRAVENOUS | Status: DC
  Administered 2015-12-20 – 2015-12-23 (×4): 1 g via INTRAVENOUS
  Filled 2015-12-19 (×4): qty 10

## 2015-12-19 MED ORDER — MORPHINE SULFATE (PF) 2 MG/ML IV SOLN: 1.0000 mg | Freq: Once | INTRAVENOUS | Status: DC | PRN

## 2015-12-19 MED ORDER — SODIUM CHLORIDE 0.9 % IV BOLUS (SEPSIS)
500.0000 mL | Freq: Once | INTRAVENOUS | Status: AC
  Administered 2015-12-19: 500 mL via INTRAVENOUS

## 2015-12-19 MED ORDER — SODIUM CHLORIDE 0.9 % IJ SOLN
3.0000 mL | Freq: Two times a day (BID) | INTRAMUSCULAR | Status: DC
  Administered 2015-12-21 – 2015-12-26 (×8): 3 mL via INTRAVENOUS

## 2015-12-19 MED ORDER — CETYLPYRIDINIUM CHLORIDE 0.05 % MT LIQD
7.0000 mL | Freq: Two times a day (BID) | OROMUCOSAL | Status: DC
  Administered 2015-12-20 – 2015-12-26 (×12): 7 mL via OROMUCOSAL

## 2015-12-19 MED ORDER — OXYCODONE HCL 5 MG PO TABS
5.0000 mg | ORAL_TABLET | ORAL | Status: DC | PRN
  Administered 2015-12-19 – 2015-12-26 (×21): 5 mg via ORAL
  Filled 2015-12-19 (×21): qty 1

## 2015-12-19 MED ORDER — MORPHINE SULFATE (PF) 4 MG/ML IV SOLN
4.0000 mg | Freq: Once | INTRAVENOUS | Status: AC
  Administered 2015-12-19: 4 mg via INTRAVENOUS
  Filled 2015-12-19: qty 1

## 2015-12-19 NOTE — Consult Note (Signed)
PULMONARY / CRITICAL CARE MEDICINE   Name: [name] MRN: [id number] DOB: 1936/08/27    ADMISSION DATE:  12/19/2015 CONSULTATION DATE:  12/22  REFERRING MD:  Wendee Beavers ([facility])   CHIEF COMPLAINT:  Respiratory failure   HISTORY OF PRESENT ILLNESS:   79yo female with hx COPD on 3.5L home O2, HTN, chronic pain, presented 12/22 with 3 day hx L sided pleuritic chest pain and malaise.  She has SOB at baseline r/t her COPD and on a "good day" can ambulate around the house with mild SOB but cannot leave the home, grocery shop, etc.  She currently denies SOB, cough or increased sputum production.  Denies fevers, chills, hemoptysis, leg swelling, recent sick contacts or recent travel.  Denies recent trauma.  In ER found to have O2 sats in the 80's, WBC 20, and L sided infiltrate and ?effusion.  Admitted by [facility] with CAP and PCCM consulted for hypoxia.     PAST MEDICAL HISTORY :  She  has a past medical history of COPD (chronic obstructive pulmonary disease) ([place]); Chronic back pain; Osteoporosis; On home oxygen therapy (06-14-13); and Hypertension.  PAST SURGICAL HISTORY: She  has past surgical history that includes Abdominal hysterectomy; sacroplasty (06-14-13); and Esophagogastroduodenoscopy (N/A, 06/15/2013).  No Known Allergies  No current facility-administered medications on file prior to encounter.   Current Outpatient Prescriptions on File Prior to Encounter  Medication Sig  . DULoxetine (CYMBALTA) 60 MG capsule Take 60 mg by mouth daily.  . sodium chloride (OCEAN) 0.65 % SOLN nasal spray Place 2 sprays into both nostrils 2 (two) times daily as needed for congestion.   . verapamil (CALAN-SR) 120 MG CR tablet Take 120 mg by mouth every morning.     FAMILY HISTORY:  Her has no family status information on file.   No known family hx autoimmune disease.   SOCIAL HISTORY: She  reports that she quit smoking about 4 years ago. Her smoking use included Cigarettes. She has never used smokeless  tobacco. She reports that she does not drink alcohol or use illicit drugs.  REVIEW OF SYSTEMS:   As per HPI - All other systems reviewed and were neg.   SUBJECTIVE:    VITAL SIGNS: BP 169/69 mmHg  Pulse 119  Temp(Src) 97.9 F (36.6 C) (Oral)  Resp 15  Ht 4\' 10"  (1.473 m)  Wt 144 lb 13.5 oz (65.7 kg)  BMI 30.28 kg/m2  SpO2 86%  HEMODYNAMICS:    VENTILATOR SETTINGS: Vent Mode:  [-]  FiO2 (%):  [55 %] 55 %  INTAKE / OUTPUT:    PHYSICAL EXAMINATION: General:  Chronically ill appearing female, appears uncomfortable  Neuro:  Awake, alert, appropriate, MAE  HEENT:  Mm dry, venti mask, no JVD  Cardiovascular:  s1s2 rrr, tachycardic  Lungs:  resps even, obvious splinting, sats improved on venti mask Abdomen:  Round, soft, non tender  Musculoskeletal:  Warm and dry, no edema  LABS:  BMET  Recent Labs Lab 12/19/15 0827  NA 140  K 3.9  CL 88*  CO2 37*  BUN 22*  CREATININE 0.75  GLUCOSE 186*    Electrolytes  Recent Labs Lab 12/19/15 0827  CALCIUM 9.5    CBC  Recent Labs Lab 12/19/15 0827  WBC 19.9*  HGB 11.3*  HCT 37.5  PLT 493*    Coag's No results for input(s): APTT, INR in the last 168 hours.  Sepsis Markers No results for input(s): LATICACIDVEN, PROCALCITON, O2SATVEN in the last 168 hours.  ABG  Recent Labs Lab 12/19/15 1010  PHART 7.379  PCO2ART 60.2*  PO2ART 64.9*    Liver Enzymes  Recent Labs Lab 12/19/15 0827  AST 23  ALT 22  ALKPHOS 81  BILITOT 0.7  ALBUMIN 3.3*    Cardiac Enzymes No results for input(s): TROPONINI, PROBNP in the last 168 hours.  Glucose No results for input(s): GLUCAP in the last 168 hours.  Imaging Dg Chest 2 View  12/19/2015  CLINICAL DATA:  Worsening shortness of breath and chest pain over 3 days EXAM: CHEST - 2 VIEW COMPARISON:  04/16/2014 FINDINGS: Cardiac shadow is stable. The right lung remains clear. There is diffuse infiltrate identified in the right lung base as well as associated  effusion. This extends superiorly along the lateral chest wall. Changes of prior vertebral augmentation are seen. IMPRESSION: New left basilar infiltrate with associated effusion. Followup PA and lateral chest X-ray is recommended in 3-4 weeks following trial of antibiotic therapy to ensure resolution and exclude underlying malignancy. Electronically Signed   By: Inez Catalina M.D.   On: 12/19/2015 08:42     STUDIES:    CULTURES: BCx2 12/22>>>  ANTIBIOTICS: Rocephin 12/22>>> Azithro 12/22>>>  SIGNIFICANT EVENTS: U/s L chest 12/22- minimal effusion   LINES/TUBES:   DISCUSSION: 79yo female with hx COPD on 3L home O2 presenting with L pleuritic chest pain r/t severe L sided CAP and ?effusion.  Only minimal effusion seen on bedside u/s eval.    ASSESSMENT / PLAN:  PULMONARY Acute respiratory failure - r/t CAP  P:   Supplemental O2 - titrate as able to keep sats 88-92%  Low dose solumedrol  F/u CXR in am  Pct protocol  BD's  Aggressive pain control to avoid splinting  Toradol x 1   Aggressive pulmonary hygiene once pain managed  If no sig improvement, consider further imaging chest - hold for now   CARDIOVASCULAR Hx HTN  Tachycardia - sinus - r/t pain  P:  Pain control as below  Cont home antiHTN   RENAL No active issue  P:   F/u chem   GASTROINTESTINAL No active issue  P:   Heart healthy diet   HEMATOLOGIC Leukocytosis  Anemia - mild, chronic  P:  F/u CBC   INFECTIOUS Severe CAP  P:   Trend pct  Rocephin, Azithro as above  F/u culture data   ENDOCRINE No active issue P:   F/u glucose with steroids   NEUROLOGIC Severe pleuritic chest pain - r/t CAP  P:   PRN morphine, oxy IR  Toradol x 1    FAMILY  - Updates:  Friend and son updated at length at bedside 12/22  PCCM will cont to follow.   Nickolas Madrid, NP 12/19/2015  11:56 AM Pager: [phone] or [phone]

## 2015-12-19 NOTE — Progress Notes (Signed)
Pt extremely upset c/o "I've never been treated so bad in my life from a hospital and place", When asked to elaborate she stated, "I have asked and asked for something for pain, there have been one nurse after another come and go, one doctor came in and sent another doctor in and they've been gone for well over an hour, damn it I hurt!" This RN looked to see orders for pain med, and checked with her RN to assure she had seen the orders. Her bedside RN had pain meds in hand on the way to the room. I reassured pt that it had not been an hour and we had to get the order before we could give medication. I also retreived Toradol to be given with her Morphine and Zofran. Bedside RN in room with meds.

## 2015-12-19 NOTE — Progress Notes (Signed)
ANTIBIOTIC CONSULT NOTE - INITIAL  Pharmacy Consult for ceftriaxone/azithromycin Indication: pneumonia  No Known Allergies  Patient Measurements: Height: 4\' 10"  (147.3 cm) Weight: 144 lb 13.5 oz (65.7 kg) IBW/kg (Calculated) : 40.9 Adjusted Body Weight:   Vital Signs: Temp: 98.4 F (36.9 C) (12/22 1200) Temp Source: Oral (12/22 1200) BP: 138/48 mmHg (12/22 1300) Pulse Rate: 106 (12/22 1300) Intake/Output from previous day:   Intake/Output from this shift: Total I/O In: 300 [IV Piggyback:300] Out: -   Labs:  Recent Labs  12/19/15 0827 12/19/15 1201  WBC 19.9* 22.7*  HGB 11.3* 10.1*  PLT 493* 489*  CREATININE 0.75 0.87   Estimated Creatinine Clearance: 43.4 mL/min (by C-G formula based on Cr of 0.87). No results for input(s): VANCOTROUGH, VANCOPEAK, VANCORANDOM, GENTTROUGH, GENTPEAK, GENTRANDOM, TOBRATROUGH, TOBRAPEAK, TOBRARND, AMIKACINPEAK, AMIKACINTROU, AMIKACIN in the last 72 hours.   Microbiology: Recent Results (from the past 720 hour(s))  Blood culture (routine x 2)     Status: None (Preliminary result)   Collection Time: 12/19/15  9:45 AM  Result Value Ref Range Status   Specimen Description BLOOD LEFT ARM  Final   Special Requests   Final    BOTTLES DRAWN AEROBIC AND ANAEROBIC 5 CC Performed at [facility]    Culture PENDING  Incomplete   Report Status PENDING  Incomplete  Blood culture (routine x 2)     Status: None (Preliminary result)   Collection Time: 12/19/15  9:55 AM  Result Value Ref Range Status   Specimen Description BLOOD RIGHT HAND  Final   Special Requests   Final    BOTTLES DRAWN AEROBIC AND ANAEROBIC 5 CC Performed at [facility]    Culture PENDING  Incomplete   Report Status PENDING  Incomplete  MRSA PCR Screening     Status: None   Collection Time: 12/19/15 11:01 AM  Result Value Ref Range Status   MRSA by PCR NEGATIVE NEGATIVE Final    Comment:        The GeneXpert MRSA Assay (FDA approved for NASAL  specimens only), is one component of a comprehensive MRSA colonization surveillance program. It is not intended to diagnose MRSA infection nor to guide or monitor treatment for MRSA infections.     Medical History: Past Medical History  Diagnosis Date  . COPD (chronic obstructive pulmonary disease) ([place])   . Chronic back pain   . Osteoporosis   . On home oxygen therapy 06-14-13    continuos 2.5l/m nasally-24/7  . Hypertension    Assessment: 74 YOF with respiratory failure and pleuritic chest pain.  History of O2 dependent COPD.  New infiltrate on CXR. Ceftriaxone and azithromycin order for CAP.   12/22 >>ceftriaxone >> 12/22 >>azithro >>  12/22 blood:  MRSA PCR: neg  Goal of Therapy:  Dose for indication and for patient-specific parameters   Plan:   Ceftriaxone 1gm IV q24h  Azithromycin 500mg  IV q24h  Both antibiotics dose appropriately and do NOT require renal dose adjustment, thus pharmacy will sign-off at this time  Doreene Eland, PharmD, BCPS.   Pager: [phone] 12/19/2015 1:41 PM

## 2015-12-19 NOTE — ED Notes (Signed)
Pt requesting pain meds before being transferred to the floor. Attempted to find ER physician and was unable to locate her to request pain meds. No holding orders for pain meds at this time. Will transfer pt to floor and advise nurse about situation and pt's request for pain meds so as not to delay pt care.

## 2015-12-19 NOTE — ED Provider Notes (Signed)
CSN: [id number]     Arrival date & time 12/19/15  0745 History   First MD Initiated Contact with Patient 12/19/15 [phone]     Chief Complaint  Patient presents with  . Shortness of Breath     (Consider location/radiation/quality/duration/timing/severity/associated sxs/prior Treatment) HPI Comments: 79 y.o. Female with history of COPD on 3.5 L [place] chronically, chronic back pain, osteoporosis, HTN presents for chest pain.  The patient reports that over the last 3 days she has pain in her chest that was initially right sided and is now left sided that seems to radiate from her chest down her side.  She reports the pain has been so bad that she has just been laying in bed which is unusual for her.  She reports she has needed to increased her supplemental O2 but denies increased shortness of breath or cough.  She denies sputum production.  She says she was hoping this was just related to pneumonia because she has had that many times in the past but that this feels different from those episodes.   Past Medical History  Diagnosis Date  . COPD (chronic obstructive pulmonary disease) ([place])   . Chronic back pain   . Osteoporosis   . On home oxygen therapy 06-14-13    continuos 2.5l/m nasally-24/7  . Hypertension    Past Surgical History  Procedure Laterality Date  . Abdominal hysterectomy    . Sacroplasty  06-14-13    05-10-13 IVR [facility] for fracture stabilization  . Esophagogastroduodenoscopy N/A 06/15/2013    Procedure: ESOPHAGOGASTRODUODENOSCOPY (EGD);  Surgeon: [name], MD;  Location: [facility];  Service: Endoscopy;  Laterality: N/A;   No family history on file. Social History  Substance Use Topics  . Smoking status: Former Smoker    Types: Cigarettes    Quit date: 06/15/2011  . Smokeless tobacco: Never Used  . Alcohol Use: No   OB History    No data available     Review of Systems  Constitutional: Positive for fatigue. Negative for fever, chills and appetite change.   HENT: Negative for congestion, postnasal drip and rhinorrhea.   Eyes: Negative for visual disturbance.  Respiratory: Positive for cough (chronic). Negative for chest tightness and shortness of breath.   Cardiovascular: Positive for chest pain (left sided). Negative for palpitations and leg swelling.  Gastrointestinal: Negative for nausea, vomiting and diarrhea.  Genitourinary: Negative for dysuria, urgency and flank pain.  Musculoskeletal: Positive for back pain. Negative for myalgias.  Skin: Negative for rash.  Neurological: Negative for dizziness, syncope, light-headedness, numbness and headaches.  Hematological: Does not bruise/bleed easily.      Allergies  Review of patient's allergies indicates no known allergies.  Home Medications   Prior to Admission medications   Medication Sig Start Date End Date Taking? Authorizing Provider  DULoxetine (CYMBALTA) 60 MG capsule Take 60 mg by mouth daily.   Yes Historical Provider, MD  sodium chloride (OCEAN) 0.65 % SOLN nasal spray Place 2 sprays into both nostrils 2 (two) times daily as needed for congestion.  03/13/14  Yes Janece Canterbury, MD  verapamil (CALAN-SR) 120 MG CR tablet Take 120 mg by mouth every morning.    Yes Historical Provider, MD   BP 155/59 mmHg  Pulse 118  Temp(Src) 97.9 F (36.6 C) (Oral)  Resp 24  SpO2 91% Physical Exam  Constitutional: She is oriented to person, place, and time. She appears well-developed and well-nourished. No distress.  HENT:  Head: Normocephalic and atraumatic.  Right Ear: External  ear normal.  Left Ear: External ear normal.  Nose: Nose normal.  Mouth/Throat: Oropharynx is clear and moist. No oropharyngeal exudate.  Eyes: EOM are normal. Pupils are equal, round, and reactive to light.  Neck: Normal range of motion. Neck supple.  Cardiovascular: Regular rhythm, normal heart sounds and intact distal pulses.  Tachycardia present.   No murmur heard. Pulmonary/Chest: Effort normal. No  respiratory distress. She has wheezes (mild, scattered). She has rales (bilateral especially on coughing).  Abdominal: Soft. She exhibits no distension. There is no tenderness.  Musculoskeletal: Normal range of motion. She exhibits no edema or tenderness.  Neurological: She is alert and oriented to person, place, and time.  Skin: Skin is warm and dry. No rash noted. She is not diaphoretic.  Vitals reviewed.   ED Course  Procedures (including critical care time) Labs Review Labs Reviewed  CBC WITH DIFFERENTIAL/PLATELET - Abnormal; Notable for the following:    WBC 19.9 (*)    Hemoglobin 11.3 (*)    Platelets 493 (*)    Neutro Abs 15.8 (*)    Monocytes Absolute 1.5 (*)    All other components within normal limits  COMPREHENSIVE METABOLIC PANEL - Abnormal; Notable for the following:    Chloride 88 (*)    CO2 37 (*)    Glucose, Bld 186 (*)    BUN 22 (*)    Total Protein 8.5 (*)    Albumin 3.3 (*)    All other components within normal limits  CULTURE, BLOOD (ROUTINE X 2)  CULTURE, BLOOD (ROUTINE X 2)  LIPASE, BLOOD  BLOOD GAS, ARTERIAL  I-STAT TROPOININ, ED    Imaging Review Dg Chest 2 View  12/19/2015  CLINICAL DATA:  Worsening shortness of breath and chest pain over 3 days EXAM: CHEST - 2 VIEW COMPARISON:  04/16/2014 FINDINGS: Cardiac shadow is stable. The right lung remains clear. There is diffuse infiltrate identified in the right lung base as well as associated effusion. This extends superiorly along the lateral chest wall. Changes of prior vertebral augmentation are seen. IMPRESSION: New left basilar infiltrate with associated effusion. Followup PA and lateral chest X-ray is recommended in 3-4 weeks following trial of antibiotic therapy to ensure resolution and exclude underlying malignancy. Electronically Signed   By: Inez Catalina M.D.   On: 12/19/2015 08:42   I have personally reviewed and evaluated these images and lab results as part of my medical decision-making.   EKG  Interpretation   Date/Time:  Thursday December 19 2015 07:55:41 EST Ventricular Rate:  112 PR Interval:  137 QRS Duration: 82 QT Interval:  322 QTC Calculation: 439 R Axis:   46 Text Interpretation:  Sinus tachycardia No significant change since last  tracing Confirmed by NGUYEN, EMILY ([id number]) on 12/19/2015 9:17:14 AM      MDM  Patient seen and evaluated at bedside.  Patient on breathing treatment and tachycardic on initial evaluation.  Patient already given 125 mg solumedrol.  Chest xray with pneumonia with effusion.  Trop normal.  WBC 19.9.  Patient started on Rocephin and Azithromycin for CAP.  Discussed with Dr. Wendee Beavers who agreed with admission.  Patient admitted under the care of Dr. Wendee Beavers to the stepdown unit.  Patietn updated on results and plan of care. Final diagnoses:  CAP (community acquired pneumonia)  Sepsis, due to unspecified organism ([place])    1. Community Acquired Pneumonia  2. Sepsis      Harvel Quale, MD 12/19/15 [phone]

## 2015-12-19 NOTE — Progress Notes (Signed)
*  PRELIMINARY RESULTS* Echocardiogram 2D Echocardiogram has been performed.  Leavy Cella 12/19/2015, 1:47 PM

## 2015-12-19 NOTE — ED Notes (Signed)
Pt presents via EMS from home with c/o chest pain and shortness of breath. Per EMS, pt reports that her symptoms started 3 days ago, started on the right side and moved to the left and around to her back. Hx of COPD. Pt is also coughing. Pt wears home O2, 3.5 liters. Pt did turn her O2 up to 5L to compensate. Per EMS, diminished in the upper fields and wheezing in the lower fields. Pt has a 20 g left AC placed by EMS, 5 albuterol and .5 atrovent, 125 solumedrol given by EMS en route.

## 2015-12-19 NOTE — ED Notes (Signed)
Bed: WA20 Expected date:  Expected time:  Means of arrival:  Comments: respiratory

## 2015-12-19 NOTE — ED Notes (Signed)
Respiratory called for ABG

## 2015-12-20 ENCOUNTER — Inpatient Hospital Stay (HOSPITAL_COMMUNITY): Payer: Medicare Other

## 2015-12-20 DIAGNOSIS — G92 Toxic encephalopathy: Secondary | ICD-10-CM

## 2015-12-20 LAB — CBC
HCT: 30.8 % — ABNORMAL LOW (ref 36.0–46.0)
HEMOGLOBIN: 9.3 g/dL — AB (ref 12.0–15.0)
MCH: 28.3 pg (ref 26.0–34.0)
MCHC: 30.2 g/dL (ref 30.0–36.0)
MCV: 93.6 fL (ref 78.0–100.0)
PLATELETS: 491 10*3/uL — AB (ref 150–400)
RBC: 3.29 MIL/uL — AB (ref 3.87–5.11)
RDW: 13.5 % (ref 11.5–15.5)
WBC: 20.7 10*3/uL — ABNORMAL HIGH (ref 4.0–10.5)

## 2015-12-20 LAB — BASIC METABOLIC PANEL
Anion gap: 11 (ref 5–15)
BUN: 22 mg/dL — AB (ref 6–20)
CHLORIDE: 92 mmol/L — AB (ref 101–111)
CO2: 36 mmol/L — ABNORMAL HIGH (ref 22–32)
Calcium: 8.7 mg/dL — ABNORMAL LOW (ref 8.9–10.3)
Creatinine, Ser: 0.68 mg/dL (ref 0.44–1.00)
GFR calc Af Amer: >60 mL/min (ref >60)
GFR calc non Af Amer: >60 mL/min (ref >60)
GLUCOSE: 185 mg/dL — AB (ref 65–99)
POTASSIUM: 4.2 mmol/L (ref 3.5–5.1)
Sodium: 139 mmol/L (ref 135–145)

## 2015-12-20 LAB — LACTIC ACID, PLASMA: Lactic Acid, Venous: 1 mmol/L (ref 0.5–2.0)

## 2015-12-20 LAB — STREP PNEUMONIAE URINARY ANTIGEN: Strep Pneumo Urinary Antigen: NEGATIVE

## 2015-12-20 MED ORDER — HALOPERIDOL 0.5 MG PO TABS
0.5000 mg | ORAL_TABLET | ORAL | Status: AC
  Administered 2015-12-20: 0.5 mg via ORAL
  Filled 2015-12-20: qty 1

## 2015-12-20 MED ORDER — PREDNISONE 50 MG PO TABS
50.0000 mg | ORAL_TABLET | Freq: Every day | ORAL | Status: DC
  Administered 2015-12-21 – 2015-12-26 (×6): 50 mg via ORAL
  Filled 2015-12-20 (×8): qty 1

## 2015-12-20 MED ORDER — ZOLPIDEM TARTRATE 5 MG PO TABS
5.0000 mg | ORAL_TABLET | Freq: Every evening | ORAL | Status: DC | PRN
  Administered 2015-12-20: 5 mg via ORAL
  Filled 2015-12-20: qty 1

## 2015-12-20 NOTE — Progress Notes (Signed)
   12/20/15 1500  Clinical Encounter Type  Visited With Patient;Health care provider;Other (Comment) (Nurse)  Visit Type Initial;Spiritual support;Psychological support;Critical Care  Referral From Nurse  Consult/Referral To [place] (Comment) (Pastoral Conversation)   The Chaplain was referred to the patient via the nurse.  The patient was confused and anxious upon the Chaplain's arrival. The patient stated that she wanted to talk to her son. The patient was attempting to get out of the bed and the sitter was able to position her back into the bed. Patient continued to get angry with the staff.  Chaplain gave her a neck pillow and that seemed to calm her down. Patient requested to hug the Chaplain, which was done in front of the sitter.  Follow-up upon request.    Chaplain Shanon Ace M.Div.

## 2015-12-20 NOTE — Progress Notes (Signed)
[facility] PROGRESS NOTE  [name] [id number] DOB: [birth year] DOA: 12/19/2015 PCP: [name], MD  Assessment/Plan:  Active Problems:   Sepsis ([place]) - resolving with antibiotics - f/u with cultures    CAP (community acquired pneumonia) - improving on current antibiotic therapy azithromycin and Rocephin  AMS/toxic encephalopathy - most likely delirium secondary to [place] - Will discontinue ambien - administered low dose haldol  Depression - continue cymbalta   Code Status: full Family Communication: no family at bedside Disposition Plan: awaiting resolution of delirium and continued improvement in respiratory condition.   Consultants:  Critical care  Procedures:  None  Antibiotics:  Azithromycin and Rocephin  HPI/Subjective: Pt has been confused and required closer monitoring. Nursing reports patient was not following recommendations and kept trying to climb out of bed.  Objective: Filed Vitals:   12/20/15 1200 12/20/15 1545  BP:  128/64  Pulse:  97  Temp: 98.3 F (36.8 C) 97.8 F (36.6 C)  Resp:  20    Intake/Output Summary (Last 24 hours) at 12/20/15 1643 Last data filed at 12/20/15 1400  Gross per 24 hour  Intake 2063.75 ml  Output    300 ml  Net 1763.75 ml   Filed Weights   12/19/15 1100 12/20/15 0026  Weight: 65.7 kg (144 lb 13.5 oz) 54.8 kg (120 lb 13 oz)    Exam:   General:  Patient in no acute distress, alert and awake, confused  Cardiovascular: S1 and S2 within normal limits, no rubs  Respiratory: No increased work of breathing, no wheezes  Abdomen: Soft, nondistended, nontender  Musculoskeletal: No cyanosis or clubbing   Data Reviewed: Basic Metabolic Panel:  Recent Labs Lab 12/19/15 0827 12/19/15 1201 12/20/15 0810  NA 140 136 139  K 3.9 3.9 4.2  CL 88* 85* 92*  CO2 37* 34* 36*  GLUCOSE 186* 383* 185*  BUN 22* 22* 22*  CREATININE 0.75 0.87 0.68  CALCIUM 9.5 9.1 8.7*  MG  --  2.2  --    PHOS  --  3.9  --    Liver Function Tests:  Recent Labs Lab 12/19/15 0827 12/19/15 1201  AST 23 27  ALT 22 22  ALKPHOS 81 76  BILITOT 0.7 0.2*  PROT 8.5* 7.9  ALBUMIN 3.3* 3.1*    Recent Labs Lab 12/19/15 0827  LIPASE 26   No results for input(s): AMMONIA in the last 168 hours. CBC:  Recent Labs Lab 12/19/15 0827 12/19/15 1201 12/20/15 0810  WBC 19.9* 22.7* 20.7*  NEUTROABS 15.8* 21.6*  --   HGB 11.3* 10.1* 9.3*  HCT 37.5 33.4* 30.8*  MCV 95.4 93.0 93.6  PLT 493* 489* 491*   Cardiac Enzymes: No results for input(s): CKTOTAL, CKMB, CKMBINDEX, TROPONINI in the last 168 hours. BNP (last 3 results) No results for input(s): BNP in the last 8760 hours.  ProBNP (last 3 results) No results for input(s): PROBNP in the last 8760 hours.  CBG: No results for input(s): GLUCAP in the last 168 hours.  Recent Results (from the past 240 hour(s))  Blood culture (routine x 2)     Status: None (Preliminary result)   Collection Time: 12/19/15  9:45 AM  Result Value Ref Range Status   Specimen Description BLOOD LEFT ARM  Final   Special Requests BOTTLES DRAWN AEROBIC AND ANAEROBIC 5 CC  Final   Culture   Final    NO GROWTH 1 DAY Performed at [facility]    Report Status PENDING  Incomplete  Blood culture (routine x 2)     Status: None (Preliminary result)   Collection Time: 12/19/15  9:55 AM  Result Value Ref Range Status   Specimen Description BLOOD RIGHT HAND  Final   Special Requests BOTTLES DRAWN AEROBIC AND ANAEROBIC 5 CC  Final   Culture   Final    NO GROWTH 1 DAY Performed at [facility]    Report Status PENDING  Incomplete  MRSA PCR Screening     Status: None   Collection Time: 12/19/15 11:01 AM  Result Value Ref Range Status   MRSA by PCR NEGATIVE NEGATIVE Final    Comment:        The GeneXpert MRSA Assay (FDA approved for NASAL specimens only), is one component of a comprehensive MRSA colonization surveillance program. It is  not intended to diagnose MRSA infection nor to guide or monitor treatment for MRSA infections.      Studies: Dg Chest 2 View  12/19/2015  CLINICAL DATA:  Worsening shortness of breath and chest pain over 3 days EXAM: CHEST - 2 VIEW COMPARISON:  04/16/2014 FINDINGS: Cardiac shadow is stable. The right lung remains clear. There is diffuse infiltrate identified in the right lung base as well as associated effusion. This extends superiorly along the lateral chest wall. Changes of prior vertebral augmentation are seen. IMPRESSION: New left basilar infiltrate with associated effusion. Followup PA and lateral chest X-ray is recommended in 3-4 weeks following trial of antibiotic therapy to ensure resolution and exclude underlying malignancy. Electronically Signed   By: Inez Catalina M.D.   On: 12/19/2015 08:42   Dg Chest Port 1 View  12/20/2015  CLINICAL DATA:  Community acquired pneumonia. EXAM: PORTABLE CHEST 1 VIEW COMPARISON:  12/19/2015 FINDINGS: The patient is rotated to the right, limiting evaluation of the mediastinum. The cardiac silhouette is not grossly enlarged. Thoracic aortic calcification is noted. The patient has taken a shallower inspiration than on the prior study. There is mild pulmonary vascular congestion and diffuse interstitial prominence which is new from the prior study. Subsegmental atelectasis is present in the right lung base. Left basilar consolidation does not appear significantly changed, again with an associated small left pleural effusion. No pneumothorax is identified. IMPRESSION: 1. Unchanged left basilar consolidation consistent with pneumonia. Left pleural effusion. 2. Hypoinflation with new pulmonary vascular congestion and diffuse interstitial prominence. Electronically Signed   By: Logan Bores M.D.   On: 12/20/2015 07:27    Scheduled Meds: . antiseptic oral rinse  7 mL Mouth Rinse BID  . azithromycin (ZITHROMAX) 500 MG IVPB  500 mg Intravenous Q24H  . cefTRIAXone  (ROCEPHIN)  IV  1 g Intravenous Q24H  . DULoxetine  60 mg Oral Daily  . heparin  5,000 Units Subcutaneous 3 times per day  . ipratropium-albuterol  3 mL Nebulization Q6H  . [START ON 12/21/2015] predniSONE  50 mg Oral Q breakfast  . sodium chloride  3 mL Intravenous Q12H  . sodium chloride  3 mL Intravenous Q12H  . verapamil  120 mg Oral q morning - 10a   Continuous Infusions: . sodium chloride 75 mL/hr (12/20/15 1058)   Time spent: > 35 minutes  Keri Veale, [facility] Pager [phone]. If 7PM-7AM, please contact night-coverage at [email or web address], password [facility] 12/20/2015, 4:43 PM  LOS: 1 day

## 2015-12-20 NOTE — Care Management Note (Signed)
Case Management Note  Patient Details  Name: [name] MRN: [id number] Date of Birth: [birth year]  Subjective/Objective:              sepsis      Action/Plan:Date: December 20, 2015 Chart reviewed for concurrent status and case management needs. Will continue to follow patient for changes and needs: Adriana Harman, RN, BSN, [place]   [phone]   Expected Discharge Date:   (UNKNOWN)               Expected Discharge Plan:  Home/Self Care  In-House Referral:  NA  Discharge planning Services  CM Consult  Post Acute Care Choice:  NA Choice offered to:  NA  DME Arranged:    DME Agency:     HH Arranged:    HH Agency:     Status of Service:  In process, will continue to follow  Medicare Important Message Given:    Date Medicare IM Given:    Medicare IM give by:    Date Additional Medicare IM Given:    Additional Medicare Important Message give by:     If discussed at [place] of Stay Meetings, dates discussed:    Additional Comments:  Adriana Cha, RN 12/20/2015, 10:16 AM

## 2015-12-20 NOTE — Progress Notes (Signed)
The patient is confused, verbally abusive, agitated, consistently climbing out of bed, removing equipment and hallucinating.  Notified charge RN and MD that this patient is an extreme fall risk.  Patient stated. "I want to find my slippers, I will do what I want when you're not in the room".  The patient also stated, "Why is everyone running in and out of my house, this is my resident".  Have reoriented the pat consistently but soon forgets where she is and what is going on.  Notified the MD.

## 2015-12-20 NOTE — Progress Notes (Signed)
Inpatient Diabetes Program Recommendations  AACE/ADA: New Consensus Statement on Inpatient Glycemic Control (2015)  Target Ranges:  Prepandial:   less than 140 mg/dL      Peak postprandial:   less than 180 mg/dL (1-2 hours)      Critically ill patients:  140 - 180 mg/dL    Results for [name], [name] (MRN [id number]) as of 12/20/2015 07:34  Ref. Range 12/19/2015 08:27 12/19/2015 12:01  Glucose Latest Ref Range: 65-99 mg/dL 186 (H) 383 (H)     -Patient currently receiving IV Solumedrol 30 mg bid.  -No History of DM noted in H&P.  -Glucose levels likely elevated due to IV steroids.     MD- Please consider placing order for CBG checks TID AC + HS   If elevated, may want to start Novolog Sensitive SSI (0-9 units) TID AC + HS     --Will follow patient during hospitalization--  Wyn Quaker RN, MSN, CDE Diabetes Coordinator Inpatient Glycemic Control Team Team Pager: [phone] (8a-5p)

## 2015-12-20 NOTE — Progress Notes (Signed)
Received report from [facility], Pt arrived unit accompanied by bed side sitter. MD notified of Pt's location. Will continue with current plan of care.

## 2015-12-20 NOTE — Consult Note (Signed)
PULMONARY / CRITICAL CARE MEDICINE   Name: [name] MRN: [id number] DOB: 21-Jun-1936    ADMISSION DATE:  12/19/2015 CONSULTATION DATE:  12/22  REFERRING MD:  Wendee Beavers ([facility])   CHIEF COMPLAINT:  Respiratory failure   HISTORY OF PRESENT ILLNESS:   79yo female with hx COPD on 3.5L home O2, HTN, chronic pain, presented 12/22 with 3 day hx L sided pleuritic chest pain and malaise.  She has SOB at baseline r/t her COPD and on a "good day" can ambulate around the house with mild SOB but cannot leave the home, grocery shop, etc.  She currently denies SOB, cough or increased sputum production.  Denies fevers, chills, hemoptysis, leg swelling, recent sick contacts or recent travel.  Denies recent trauma.  In ER found to have O2 sats in the 80's, WBC 20, and L sided infiltrate and ?effusion.  Admitted by [facility] with CAP and PCCM consulted for hypoxia.     PAST MEDICAL HISTORY :  She  has a past medical history of COPD (chronic obstructive pulmonary disease) ([place]); Chronic back pain; Osteoporosis; On home oxygen therapy (06-14-13); and Hypertension.  PAST SURGICAL HISTORY: She  has past surgical history that includes Abdominal hysterectomy; sacroplasty (06-14-13); and Esophagogastroduodenoscopy (N/A, 06/15/2013).  Allergies  Allergen Reactions  . Ambien [Zolpidem] Other (See Comments)    Hallucinations and paranoid    No current facility-administered medications on file prior to encounter.   Current Outpatient Prescriptions on File Prior to Encounter  Medication Sig  . DULoxetine (CYMBALTA) 60 MG capsule Take 60 mg by mouth daily.  . sodium chloride (OCEAN) 0.65 % SOLN nasal spray Place 2 sprays into both nostrils 2 (two) times daily as needed for congestion.   . verapamil (CALAN-SR) 120 MG CR tablet Take 120 mg by mouth every morning.     FAMILY HISTORY:  Her has no family status information on file.   No known family hx autoimmune disease.   SOCIAL HISTORY: She  reports that she quit  smoking about 4 years ago. Her smoking use included Cigarettes. She has never used smokeless tobacco. She reports that she does not drink alcohol or use illicit drugs.  REVIEW OF SYSTEMS:   As per HPI - All other systems reviewed and were neg.   SUBJECTIVE:  Feels better today. Pain is improved.  VITAL SIGNS: BP 151/58 mmHg  Pulse 82  Temp(Src) 97.5 F (36.4 C) (Oral)  Resp 27  Ht 4\' 10"  (1.473 m)  Wt 54.8 kg (120 lb 13 oz)  BMI 25.26 kg/m2  SpO2 92%  HEMODYNAMICS:    VENTILATOR SETTINGS: Vent Mode:  [-]  FiO2 (%):  [55 %] 55 %  INTAKE / OUTPUT: I/O last 3 completed shifts: In: 1263.8 [I.V.:963.8; IV Piggyback:300] Out: 300 [Urine:300]  PHYSICAL EXAMINATION: General:  Chronically ill appearing female, appears uncomfortable  Neuro:  Awake, alert, appropriate, MAE  HEENT:  Mm dry, venti mask, no JVD  Cardiovascular:  s1s2 rrr, tachycardic  Lungs:  resps even, obvious splinting, sats improved on venti mask Abdomen:  Round, soft, non tender  Musculoskeletal:  Warm and dry, no edema  LABS:  BMET  Recent Labs Lab 12/19/15 0827 12/19/15 1201 12/20/15 0810  NA 140 136 139  K 3.9 3.9 4.2  CL 88* 85* 92*  CO2 37* 34* 36*  BUN 22* 22* 22*  CREATININE 0.75 0.87 0.68  GLUCOSE 186* 383* 185*    Electrolytes  Recent Labs Lab 12/19/15 0827 12/19/15 1201 12/20/15 0810  CALCIUM 9.5 9.1  8.7*  MG  --  2.2  --   PHOS  --  3.9  --     CBC  Recent Labs Lab 12/19/15 0827 12/19/15 1201 12/20/15 0810  WBC 19.9* 22.7* 20.7*  HGB 11.3* 10.1* 9.3*  HCT 37.5 33.4* 30.8*  PLT 493* 489* 491*    Coag's  Recent Labs Lab 12/19/15 1201  APTT 30  INR 1.20    Sepsis Markers  Recent Labs Lab 12/19/15 1201 12/19/15 1506  LATICACIDVEN 4.4* 3.6*  PROCALCITON 0.11  --     ABG  Recent Labs Lab 12/19/15 1010  PHART 7.379  PCO2ART 60.2*  PO2ART 64.9*    Liver Enzymes  Recent Labs Lab 12/19/15 0827 12/19/15 1201  AST 23 27  ALT 22 22  ALKPHOS  81 76  BILITOT 0.7 0.2*  ALBUMIN 3.3* 3.1*    Cardiac Enzymes No results for input(s): TROPONINI, PROBNP in the last 168 hours.  Glucose No results for input(s): GLUCAP in the last 168 hours.  Imaging Dg Chest Port 1 View  12/20/2015  CLINICAL DATA:  Community acquired pneumonia. EXAM: PORTABLE CHEST 1 VIEW COMPARISON:  12/19/2015 FINDINGS: The patient is rotated to the right, limiting evaluation of the mediastinum. The cardiac silhouette is not grossly enlarged. Thoracic aortic calcification is noted. The patient has taken a shallower inspiration than on the prior study. There is mild pulmonary vascular congestion and diffuse interstitial prominence which is new from the prior study. Subsegmental atelectasis is present in the right lung base. Left basilar consolidation does not appear significantly changed, again with an associated small left pleural effusion. No pneumothorax is identified. IMPRESSION: 1. Unchanged left basilar consolidation consistent with pneumonia. Left pleural effusion. 2. Hypoinflation with new pulmonary vascular congestion and diffuse interstitial prominence. Electronically Signed   By: Logan Bores M.D.   On: 12/20/2015 07:27     STUDIES:    CULTURES: BCx2 12/22>>>  ANTIBIOTICS: Rocephin 12/22>>> Azithro 12/22>>>  SIGNIFICANT EVENTS: U/s L chest 12/22- minimal effusion   LINES/TUBES:   DISCUSSION: 79yo female with hx COPD on 3L home O2 presenting with L pleuritic chest pain r/t severe L sided CAP and ?effusion.  Only minimal effusion seen on bedside u/s eval.    ASSESSMENT / PLAN:  PULMONARY Acute respiratory failure - r/t CAP  P:   Supplemental O2 - titrate as able to keep sats 88-92%  Continue steroids. Can change to prednisone 50 mg for total of 5 days.  Pct protocol  BD's  Aggressive pain control to avoid splinting  Aggressive pulmonary hygiene once pain managed. Incentive spirometry   CARDIOVASCULAR Hx HTN  Tachycardia - sinus - r/t  pain  P:  Pain control as below  Cont home antiHTN   RENAL No active issue  P:   F/u chem   GASTROINTESTINAL No active issue  P:   Heart healthy diet   HEMATOLOGIC Leukocytosis  Anemia - mild, chronic  P:  F/u CBC   INFECTIOUS Severe CAP  P:   Trend pct  Rocephin, Azithro as above  F/u culture data   ENDOCRINE No active issue P:   F/u glucose with steroids   NEUROLOGIC Severe pleuritic chest pain - r/t CAP  P:   PRN morphine, oxy IR  Toradol x 1   FAMILY  - Updates:  Patient updated at bedside. PCCM will sign off. Please call back if any new questions arise.  Marshell Garfinkel MD [facility] Pager [phone] If no answer  or after 3pm call: [phone] 12/20/2015, 9:14 AM

## 2015-12-20 NOTE — Progress Notes (Signed)
Haldol 0.5 mg PO administered at 1414.  Agitated, aggressive behavior improved within 30 minutes of receiving the Haldol.  Dr. Wendee Beavers came and assessed the patient.  He helped deescalate her behavior and ordered a Air cabin crew.  Patient transferred to room 1502 via bed.

## 2015-12-21 LAB — PROCALCITONIN: Procalcitonin: <0.1 ng/mL

## 2015-12-21 NOTE — Progress Notes (Signed)
[facility] PROGRESS NOTE  [name] [id number] DOB: 1936/08/26 DOA: 12/19/2015 PCP: [name], MD  Assessment/Plan:  Active Problems:   Sepsis ([place]) - resolving with antibiotics - f/u with cultures    CAP (community acquired pneumonia) - improving on current antibiotic therapy azithromycin and Rocephin  AMS/toxic encephalopathy - most likely delirium secondary to [place] - Will continue to hold ambien - administered low dose haldol  Depression - continue cymbalta   Code Status: full Family Communication: d/c patient Disposition Plan: awaiting resolution of delirium and continued improvement in respiratory condition.   Consultants:  Critical care  Procedures:  None  Antibiotics:  Azithromycin and Rocephin  HPI/Subjective: Pt has no new complaints. She states she feels better and is less confused. States she does not want any more ambien moving forward  Objective: Filed Vitals:   12/20/15 2135 12/21/15 0534  BP: 127/61 118/53  Pulse: 91 76  Temp: 98.2 F (36.8 C) 97.3 F (36.3 C)  Resp: 18 18    Intake/Output Summary (Last 24 hours) at 12/21/15 1151 Last data filed at 12/20/15 1400  Gross per 24 hour  Intake    850 ml  Output      0 ml  Net    850 ml   Filed Weights   12/19/15 1100 12/20/15 0026  Weight: 65.7 kg (144 lb 13.5 oz) 54.8 kg (120 lb 13 oz)    Exam:   General:  Patient in no acute distress, alert and awake, confused  Cardiovascular: S1 and S2 within normal limits, no rubs  Respiratory: No increased work of breathing, no wheezes  Abdomen: Soft, nondistended, nontender  Musculoskeletal: No cyanosis or clubbing   Data Reviewed: Basic Metabolic Panel:  Recent Labs Lab 12/19/15 0827 12/19/15 1201 12/20/15 0810  NA 140 136 139  K 3.9 3.9 4.2  CL 88* 85* 92*  CO2 37* 34* 36*  GLUCOSE 186* 383* 185*  BUN 22* 22* 22*  CREATININE 0.75 0.87 0.68  CALCIUM 9.5 9.1 8.7*  MG  --  2.2  --   PHOS  --   3.9  --    Liver Function Tests:  Recent Labs Lab 12/19/15 0827 12/19/15 1201  AST 23 27  ALT 22 22  ALKPHOS 81 76  BILITOT 0.7 0.2*  PROT 8.5* 7.9  ALBUMIN 3.3* 3.1*    Recent Labs Lab 12/19/15 0827  LIPASE 26   No results for input(s): AMMONIA in the last 168 hours. CBC:  Recent Labs Lab 12/19/15 0827 12/19/15 1201 12/20/15 0810  WBC 19.9* 22.7* 20.7*  NEUTROABS 15.8* 21.6*  --   HGB 11.3* 10.1* 9.3*  HCT 37.5 33.4* 30.8*  MCV 95.4 93.0 93.6  PLT 493* 489* 491*   Cardiac Enzymes: No results for input(s): CKTOTAL, CKMB, CKMBINDEX, TROPONINI in the last 168 hours. BNP (last 3 results) No results for input(s): BNP in the last 8760 hours.  ProBNP (last 3 results) No results for input(s): PROBNP in the last 8760 hours.  CBG: No results for input(s): GLUCAP in the last 168 hours.  Recent Results (from the past 240 hour(s))  Blood culture (routine x 2)     Status: None (Preliminary result)   Collection Time: 12/19/15  9:45 AM  Result Value Ref Range Status   Specimen Description BLOOD LEFT ARM  Final   Special Requests BOTTLES DRAWN AEROBIC AND ANAEROBIC 5 CC  Final   Culture   Final    NO GROWTH 1 DAY Performed at [facility]  Report Status PENDING  Incomplete  Blood culture (routine x 2)     Status: None (Preliminary result)   Collection Time: 12/19/15  9:55 AM  Result Value Ref Range Status   Specimen Description BLOOD RIGHT HAND  Final   Special Requests BOTTLES DRAWN AEROBIC AND ANAEROBIC 5 CC  Final   Culture   Final    NO GROWTH 1 DAY Performed at [facility]    Report Status PENDING  Incomplete  MRSA PCR Screening     Status: None   Collection Time: 12/19/15 11:01 AM  Result Value Ref Range Status   MRSA by PCR NEGATIVE NEGATIVE Final    Comment:        The GeneXpert MRSA Assay (FDA approved for NASAL specimens only), is one component of a comprehensive MRSA colonization surveillance program. It is not intended to  diagnose MRSA infection nor to guide or monitor treatment for MRSA infections.      Studies: Dg Chest Port 1 View  12/20/2015  CLINICAL DATA:  Community acquired pneumonia. EXAM: PORTABLE CHEST 1 VIEW COMPARISON:  12/19/2015 FINDINGS: The patient is rotated to the right, limiting evaluation of the mediastinum. The cardiac silhouette is not grossly enlarged. Thoracic aortic calcification is noted. The patient has taken a shallower inspiration than on the prior study. There is mild pulmonary vascular congestion and diffuse interstitial prominence which is new from the prior study. Subsegmental atelectasis is present in the right lung base. Left basilar consolidation does not appear significantly changed, again with an associated small left pleural effusion. No pneumothorax is identified. IMPRESSION: 1. Unchanged left basilar consolidation consistent with pneumonia. Left pleural effusion. 2. Hypoinflation with new pulmonary vascular congestion and diffuse interstitial prominence. Electronically Signed   By: Logan Bores M.D.   On: 12/20/2015 07:27    Scheduled Meds: . antiseptic oral rinse  7 mL Mouth Rinse BID  . azithromycin (ZITHROMAX) 500 MG IVPB  500 mg Intravenous Q24H  . cefTRIAXone (ROCEPHIN)  IV  1 g Intravenous Q24H  . DULoxetine  60 mg Oral Daily  . heparin  5,000 Units Subcutaneous 3 times per day  . ipratropium-albuterol  3 mL Nebulization Q6H  . predniSONE  50 mg Oral Q breakfast  . sodium chloride  3 mL Intravenous Q12H  . sodium chloride  3 mL Intravenous Q12H  . verapamil  120 mg Oral q morning - 10a   Continuous Infusions:   Time spent: > 35 minutes  Velvet Bathe  [facility] Pager [phone]. If 7PM-7AM, please contact night-coverage at [email or web address], password [email or web address] 12/21/2015, 11:51 AM  LOS: 2 days

## 2015-12-22 LAB — BASIC METABOLIC PANEL
Anion gap: 10 (ref 5–15)
BUN: 19 mg/dL (ref 6–20)
CHLORIDE: 94 mmol/L — AB (ref 101–111)
CO2: 39 mmol/L — ABNORMAL HIGH (ref 22–32)
CREATININE: 0.6 mg/dL (ref 0.44–1.00)
Calcium: 9.1 mg/dL (ref 8.9–10.3)
GFR calc non Af Amer: >60 mL/min (ref >60)
Glucose, Bld: 109 mg/dL — ABNORMAL HIGH (ref 65–99)
POTASSIUM: 4.2 mmol/L (ref 3.5–5.1)
SODIUM: 143 mmol/L (ref 135–145)

## 2015-12-22 LAB — CBC
HCT: 34.4 % — ABNORMAL LOW (ref 36.0–46.0)
HEMOGLOBIN: 10.2 g/dL — AB (ref 12.0–15.0)
MCH: 27.8 pg (ref 26.0–34.0)
MCHC: 29.7 g/dL — ABNORMAL LOW (ref 30.0–36.0)
MCV: 93.7 fL (ref 78.0–100.0)
Platelets: 585 10*3/uL — ABNORMAL HIGH (ref 150–400)
RBC: 3.67 MIL/uL — AB (ref 3.87–5.11)
RDW: 13.4 % (ref 11.5–15.5)
WBC: 16.3 10*3/uL — AB (ref 4.0–10.5)

## 2015-12-22 NOTE — Progress Notes (Signed)
[facility] PROGRESS NOTE  [name] [id number] DOB: 1936-03-09 DOA: 12/19/2015 PCP: [name], MD  Assessment/Plan:  Active Problems:   Sepsis ([place]) - resolving with antibiotics - f/u with cultures    CAP (community acquired pneumonia) - improving on current antibiotic therapy azithromycin and Rocephin will continue for another day then transition to oral regimen. - wbc trending down  AMS/toxic encephalopathy - most likely delirium secondary to [place] - Will continue to hold ambien - administered low dose haldol  Depression - continue cymbalta   Code Status: full Family Communication: d/c patient Disposition Plan: awaiting resolution of delirium and continued improvement in respiratory condition.   Consultants:  Critical care  Procedures:  None  Antibiotics:  Azithromycin and Rocephin  HPI/Subjective: Pt has no new complaints. Was asking for a diet change yesterday. Is happy with her diet today.  Objective: Filed Vitals:   12/21/15 2110 12/22/15 0611  BP: 137/62 118/82  Pulse: 94 77  Temp: 98.2 F (36.8 C) 97.6 F (36.4 C)  Resp: 18 17    Intake/Output Summary (Last 24 hours) at 12/22/15 1204 Last data filed at 12/21/15 2130  Gross per 24 hour  Intake    360 ml  Output      0 ml  Net    360 ml   Filed Weights   12/19/15 1100 12/20/15 0026  Weight: 65.7 kg (144 lb 13.5 oz) 54.8 kg (120 lb 13 oz)    Exam:   General:  Patient in no acute distress, alert and awake, confused  Cardiovascular: S1 and S2 within normal limits, no rubs  Respiratory: No increased work of breathing, no wheezes  Abdomen: Soft, nondistended, nontender  Musculoskeletal: No cyanosis or clubbing   Data Reviewed: Basic Metabolic Panel:  Recent Labs Lab 12/19/15 0827 12/19/15 1201 12/20/15 0810 12/22/15 0509  NA 140 136 139 143  K 3.9 3.9 4.2 4.2  CL 88* 85* 92* 94*  CO2 37* 34* 36* 39*  GLUCOSE 186* 383* 185* 109*  BUN 22* 22* 22*  19  CREATININE 0.75 0.87 0.68 0.60  CALCIUM 9.5 9.1 8.7* 9.1  MG  --  2.2  --   --   PHOS  --  3.9  --   --    Liver Function Tests:  Recent Labs Lab 12/19/15 0827 12/19/15 1201  AST 23 27  ALT 22 22  ALKPHOS 81 76  BILITOT 0.7 0.2*  PROT 8.5* 7.9  ALBUMIN 3.3* 3.1*    Recent Labs Lab 12/19/15 0827  LIPASE 26   No results for input(s): AMMONIA in the last 168 hours. CBC:  Recent Labs Lab 12/19/15 0827 12/19/15 1201 12/20/15 0810 12/22/15 0509  WBC 19.9* 22.7* 20.7* 16.3*  NEUTROABS 15.8* 21.6*  --   --   HGB 11.3* 10.1* 9.3* 10.2*  HCT 37.5 33.4* 30.8* 34.4*  MCV 95.4 93.0 93.6 93.7  PLT 493* 489* 491* 585*   Cardiac Enzymes: No results for input(s): CKTOTAL, CKMB, CKMBINDEX, TROPONINI in the last 168 hours. BNP (last 3 results) No results for input(s): BNP in the last 8760 hours.  ProBNP (last 3 results) No results for input(s): PROBNP in the last 8760 hours.  CBG: No results for input(s): GLUCAP in the last 168 hours.  Recent Results (from the past 240 hour(s))  Blood culture (routine x 2)     Status: None (Preliminary result)   Collection Time: 12/19/15  9:45 AM  Result Value Ref Range Status   Specimen Description BLOOD LEFT  ARM  Final   Special Requests BOTTLES DRAWN AEROBIC AND ANAEROBIC 5 CC  Final   Culture   Final    NO GROWTH 2 DAYS Performed at [facility]    Report Status PENDING  Incomplete  Blood culture (routine x 2)     Status: None (Preliminary result)   Collection Time: 12/19/15  9:55 AM  Result Value Ref Range Status   Specimen Description BLOOD RIGHT HAND  Final   Special Requests BOTTLES DRAWN AEROBIC AND ANAEROBIC 5 CC  Final   Culture   Final    NO GROWTH 2 DAYS Performed at [facility]    Report Status PENDING  Incomplete  MRSA PCR Screening     Status: None   Collection Time: 12/19/15 11:01 AM  Result Value Ref Range Status   MRSA by PCR NEGATIVE NEGATIVE Final    Comment:        The GeneXpert MRSA  Assay (FDA approved for NASAL specimens only), is one component of a comprehensive MRSA colonization surveillance program. It is not intended to diagnose MRSA infection nor to guide or monitor treatment for MRSA infections.      Studies: No results found.  Scheduled Meds: . antiseptic oral rinse  7 mL Mouth Rinse BID  . azithromycin (ZITHROMAX) 500 MG IVPB  500 mg Intravenous Q24H  . cefTRIAXone (ROCEPHIN)  IV  1 g Intravenous Q24H  . DULoxetine  60 mg Oral Daily  . heparin  5,000 Units Subcutaneous 3 times per day  . ipratropium-albuterol  3 mL Nebulization Q6H  . predniSONE  50 mg Oral Q breakfast  . sodium chloride  3 mL Intravenous Q12H  . sodium chloride  3 mL Intravenous Q12H  . verapamil  120 mg Oral q morning - 10a   Continuous Infusions:   Time spent: > 35 minutes  Velvet Bathe  [facility] Pager [phone]. If 7PM-7AM, please contact night-coverage at [email or web address], password [facility] 12/22/2015, 12:04 PM  LOS: 3 days

## 2015-12-23 LAB — CBC
HEMATOCRIT: 31.2 % — AB (ref 36.0–46.0)
Hemoglobin: 9.5 g/dL — ABNORMAL LOW (ref 12.0–15.0)
MCH: 28.4 pg (ref 26.0–34.0)
MCHC: 30.4 g/dL (ref 30.0–36.0)
MCV: 93.4 fL (ref 78.0–100.0)
Platelets: 463 10*3/uL — ABNORMAL HIGH (ref 150–400)
RBC: 3.34 MIL/uL — ABNORMAL LOW (ref 3.87–5.11)
RDW: 13.3 % (ref 11.5–15.5)
WBC: 15.1 10*3/uL — AB (ref 4.0–10.5)

## 2015-12-23 LAB — LEGIONELLA ANTIGEN, URINE

## 2015-12-23 MED ORDER — CEFPODOXIME PROXETIL 200 MG PO TABS
200.0000 mg | ORAL_TABLET | Freq: Two times a day (BID) | ORAL | Status: DC
  Administered 2015-12-23 – 2015-12-26 (×7): 200 mg via ORAL
  Filled 2015-12-23 (×8): qty 1

## 2015-12-23 MED ORDER — AZITHROMYCIN 500 MG PO TABS
500.0000 mg | ORAL_TABLET | Freq: Every day | ORAL | Status: DC
  Administered 2015-12-23 – 2015-12-26 (×4): 500 mg via ORAL
  Filled 2015-12-23 (×4): qty 1

## 2015-12-23 NOTE — Progress Notes (Signed)
[facility] PROGRESS NOTE  [name] [id number] DOB: [birth year] DOA: 12/19/2015 PCP: [name], MD  Assessment/Plan:  Active Problems:   Sepsis ([place]) - resolving with antibiotics - f/u with cultures    CAP (community acquired pneumonia) - Given improvement will transition to oral antibiotic regimen and observe. If doing well will plan on d/c next am. - wbc trending down  AMS/toxic encephalopathy - most likely delirium secondary to ambien - resolved  Depression - continue cymbalta   Code Status: full Family Communication: d/c patient Disposition Plan: awaiting resolution of delirium and continued improvement in respiratory condition.   Consultants:  Critical care  Procedures:  None  Antibiotics:  Azithromycin and Rocephin  HPI/Subjective: Pt has no new complaints. No acute issues overnight reported.  Objective: Filed Vitals:   12/23/15 0600 12/23/15 1418  BP: 152/63 151/80  Pulse: 79 90  Temp: 97.7 F (36.5 C) 98.5 F (36.9 C)  Resp: 18 19    Intake/Output Summary (Last 24 hours) at 12/23/15 1626 Last data filed at 12/23/15 1228  Gross per 24 hour  Intake   1320 ml  Output      0 ml  Net   1320 ml   Filed Weights   12/19/15 1100 12/20/15 0026  Weight: 65.7 kg (144 lb 13.5 oz) 54.8 kg (120 lb 13 oz)    Exam:   General:  Patient in no acute distress, alert and awake, confused  Cardiovascular: S1 and S2 within normal limits, no rubs  Respiratory: No increased work of breathing, no wheezes  Abdomen: Soft, nondistended, nontender  Musculoskeletal: No cyanosis or clubbing   Data Reviewed: Basic Metabolic Panel:  Recent Labs Lab 12/19/15 0827 12/19/15 1201 12/20/15 0810 12/22/15 0509  NA 140 136 139 143  K 3.9 3.9 4.2 4.2  CL 88* 85* 92* 94*  CO2 37* 34* 36* 39*  GLUCOSE 186* 383* 185* 109*  BUN 22* 22* 22* 19  CREATININE 0.75 0.87 0.68 0.60  CALCIUM 9.5 9.1 8.7* 9.1  MG  --  2.2  --   --   PHOS  --   3.9  --   --    Liver Function Tests:  Recent Labs Lab 12/19/15 0827 12/19/15 1201  AST 23 27  ALT 22 22  ALKPHOS 81 76  BILITOT 0.7 0.2*  PROT 8.5* 7.9  ALBUMIN 3.3* 3.1*    Recent Labs Lab 12/19/15 0827  LIPASE 26   No results for input(s): AMMONIA in the last 168 hours. CBC:  Recent Labs Lab 12/19/15 0827 12/19/15 1201 12/20/15 0810 12/22/15 0509 12/23/15 0541  WBC 19.9* 22.7* 20.7* 16.3* 15.1*  NEUTROABS 15.8* 21.6*  --   --   --   HGB 11.3* 10.1* 9.3* 10.2* 9.5*  HCT 37.5 33.4* 30.8* 34.4* 31.2*  MCV 95.4 93.0 93.6 93.7 93.4  PLT 493* 489* 491* 585* 463*   Cardiac Enzymes: No results for input(s): CKTOTAL, CKMB, CKMBINDEX, TROPONINI in the last 168 hours. BNP (last 3 results) No results for input(s): BNP in the last 8760 hours.  ProBNP (last 3 results) No results for input(s): PROBNP in the last 8760 hours.  CBG: No results for input(s): GLUCAP in the last 168 hours.  Recent Results (from the past 240 hour(s))  Blood culture (routine x 2)     Status: None (Preliminary result)   Collection Time: 12/19/15  9:45 AM  Result Value Ref Range Status   Specimen Description BLOOD LEFT ARM  Final   Special Requests BOTTLES  DRAWN AEROBIC AND ANAEROBIC 5 CC  Final   Culture   Final    NO GROWTH 4 DAYS Performed at [facility]    Report Status PENDING  Incomplete  Blood culture (routine x 2)     Status: None (Preliminary result)   Collection Time: 12/19/15  9:55 AM  Result Value Ref Range Status   Specimen Description BLOOD RIGHT HAND  Final   Special Requests BOTTLES DRAWN AEROBIC AND ANAEROBIC 5 CC  Final   Culture   Final    NO GROWTH 4 DAYS Performed at [facility]    Report Status PENDING  Incomplete  MRSA PCR Screening     Status: None   Collection Time: 12/19/15 11:01 AM  Result Value Ref Range Status   MRSA by PCR NEGATIVE NEGATIVE Final    Comment:        The GeneXpert MRSA Assay (FDA approved for NASAL specimens only), is  one component of a comprehensive MRSA colonization surveillance program. It is not intended to diagnose MRSA infection nor to guide or monitor treatment for MRSA infections.      Studies: No results found.  Scheduled Meds: . antiseptic oral rinse  7 mL Mouth Rinse BID  . azithromycin  500 mg Oral Daily  . cefpodoxime  200 mg Oral Q12H  . DULoxetine  60 mg Oral Daily  . heparin  5,000 Units Subcutaneous 3 times per day  . ipratropium-albuterol  3 mL Nebulization Q6H  . predniSONE  50 mg Oral Q breakfast  . sodium chloride  3 mL Intravenous Q12H  . sodium chloride  3 mL Intravenous Q12H  . verapamil  120 mg Oral q morning - 10a   Continuous Infusions:   Time spent: > 35 minutes  Velvet Bathe  [facility] Pager [phone]. If 7PM-7AM, please contact night-coverage at [email or web address], password [facility] 12/23/2015, 4:26 PM  LOS: 4 days

## 2015-12-24 LAB — CBC
HEMATOCRIT: 34.5 % — AB (ref 36.0–46.0)
Hemoglobin: 10.5 g/dL — ABNORMAL LOW (ref 12.0–15.0)
MCH: 28.3 pg (ref 26.0–34.0)
MCHC: 30.4 g/dL (ref 30.0–36.0)
MCV: 93 fL (ref 78.0–100.0)
PLATELETS: 520 10*3/uL — AB (ref 150–400)
RBC: 3.71 MIL/uL — ABNORMAL LOW (ref 3.87–5.11)
RDW: 13.5 % (ref 11.5–15.5)
WBC: 14.6 10*3/uL — AB (ref 4.0–10.5)

## 2015-12-24 LAB — CULTURE, BLOOD (ROUTINE X 2)
Culture: NO GROWTH
Culture: NO GROWTH

## 2015-12-24 MED ORDER — LIP MEDEX EX OINT
TOPICAL_OINTMENT | CUTANEOUS | Status: AC
  Administered 2015-12-24: 16:00:00
  Filled 2015-12-24: qty 7

## 2015-12-24 NOTE — Care Management Note (Signed)
Case Management Note  Patient Details  Name: [name] MRN: [id number] Date of Birth: 1936-09-28  Subjective/Objective:    Admitted with PNA                Action/Plan: Discharge planning, no HH needs identified. Home O2 in place already, has travel tank.   Expected Discharge Date:   (UNKNOWN)               Expected Discharge Plan:  Home/Self Care  In-House Referral:  NA  Discharge planning Services  CM Consult  Post Acute Care Choice:  NA Choice offered to:  NA  DME Arranged:  N/A DME Agency:  NA  HH Arranged:  NA HH Agency:  NA  Status of Service:  Completed, signed off  Medicare Important Message Given:    Date Medicare IM Given:    Medicare IM give by:    Date Additional Medicare IM Given:    Additional Medicare Important Message give by:     If discussed at [place] of Stay Meetings, dates discussed:    Additional Comments:  Guadalupe Maple, RN 12/24/2015, 11:47 AM

## 2015-12-24 NOTE — Progress Notes (Signed)
[facility] PROGRESS NOTE  [name] [id number] DOB: [birth year] DOA: 12/19/2015 PCP: [name], MD  Brief narrative: Patient is a 79 year old with history of chronic respiratory failure and COPD on home oxygen 3 L. Presented to the hospital sepsis secondary to community-acquired pneumonia. Hospital stay was complicated by delirium secondary to Ambien which has been discontinued.  Assessment/Plan:  Active Problems:   Sepsis ([place]) - resolving with antibiotics - f/u with cultures - WBC trending down    CAP (community acquired pneumonia) - Continue oral antibiotic regimen - Patient still at 6 L oxygen supplementation. Her home regimen is 3 L. We'll try to wean back down to her home regimen  AMS/toxic encephalopathy - most likely delirium secondary to ambien - resolved  Depression - continue cymbalta   Code Status: full Family Communication: d/c patient Disposition Plan: d/c in the next 1-2 days with continued improvement in respiratory condition with supplemental oxygen coming back down to home regimen   Consultants:  Critical care  Procedures:  None  Antibiotics:  Azithromycin and Vantin  HPI/Subjective: Pt has no new complaints. No acute issues overnight reported.  Objective: Filed Vitals:   12/24/15 0529 12/24/15 1336  BP: 132/68 142/54  Pulse: 96 91  Temp: 98.8 F (37.1 C) 98.2 F (36.8 C)  Resp: 18 16    Intake/Output Summary (Last 24 hours) at 12/24/15 1413 Last data filed at 12/24/15 1258  Gross per 24 hour  Intake    720 ml  Output      0 ml  Net    720 ml   Filed Weights   12/19/15 1100 12/20/15 0026  Weight: 65.7 kg (144 lb 13.5 oz) 54.8 kg (120 lb 13 oz)    Exam:   General:  Patient in no acute distress, alert and awake, confused  Cardiovascular: S1 and S2 within normal limits, no rubs  Respiratory: No increased work of breathing, no wheezes  Abdomen: Soft, nondistended, nontender  Musculoskeletal: No  cyanosis or clubbing   Data Reviewed: Basic Metabolic Panel:  Recent Labs Lab 12/19/15 0827 12/19/15 1201 12/20/15 0810 12/22/15 0509  NA 140 136 139 143  K 3.9 3.9 4.2 4.2  CL 88* 85* 92* 94*  CO2 37* 34* 36* 39*  GLUCOSE 186* 383* 185* 109*  BUN 22* 22* 22* 19  CREATININE 0.75 0.87 0.68 0.60  CALCIUM 9.5 9.1 8.7* 9.1  MG  --  2.2  --   --   PHOS  --  3.9  --   --    Liver Function Tests:  Recent Labs Lab 12/19/15 0827 12/19/15 1201  AST 23 27  ALT 22 22  ALKPHOS 81 76  BILITOT 0.7 0.2*  PROT 8.5* 7.9  ALBUMIN 3.3* 3.1*    Recent Labs Lab 12/19/15 0827  LIPASE 26   No results for input(s): AMMONIA in the last 168 hours. CBC:  Recent Labs Lab 12/19/15 0827 12/19/15 1201 12/20/15 0810 12/22/15 0509 12/23/15 0541 12/24/15 0521  WBC 19.9* 22.7* 20.7* 16.3* 15.1* 14.6*  NEUTROABS 15.8* 21.6*  --   --   --   --   HGB 11.3* 10.1* 9.3* 10.2* 9.5* 10.5*  HCT 37.5 33.4* 30.8* 34.4* 31.2* 34.5*  MCV 95.4 93.0 93.6 93.7 93.4 93.0  PLT 493* 489* 491* 585* 463* 520*   Cardiac Enzymes: No results for input(s): CKTOTAL, CKMB, CKMBINDEX, TROPONINI in the last 168 hours. BNP (last 3 results) No results for input(s): BNP in the last 8760 hours.  ProBNP (last  3 results) No results for input(s): PROBNP in the last 8760 hours.  CBG: No results for input(s): GLUCAP in the last 168 hours.  Recent Results (from the past 240 hour(s))  Blood culture (routine x 2)     Status: None   Collection Time: 12/19/15  9:45 AM  Result Value Ref Range Status   Specimen Description BLOOD LEFT ARM  Final   Special Requests BOTTLES DRAWN AEROBIC AND ANAEROBIC 5 CC  Final   Culture   Final    NO GROWTH 5 DAYS Performed at [facility]    Report Status 12/24/2015 FINAL  Final  Blood culture (routine x 2)     Status: None   Collection Time: 12/19/15  9:55 AM  Result Value Ref Range Status   Specimen Description BLOOD RIGHT HAND  Final   Special Requests BOTTLES DRAWN  AEROBIC AND ANAEROBIC 5 CC  Final   Culture   Final    NO GROWTH 5 DAYS Performed at [facility]    Report Status 12/24/2015 FINAL  Final  MRSA PCR Screening     Status: None   Collection Time: 12/19/15 11:01 AM  Result Value Ref Range Status   MRSA by PCR NEGATIVE NEGATIVE Final    Comment:        The GeneXpert MRSA Assay (FDA approved for NASAL specimens only), is one component of a comprehensive MRSA colonization surveillance program. It is not intended to diagnose MRSA infection nor to guide or monitor treatment for MRSA infections.      Studies: No results found.  Scheduled Meds: . antiseptic oral rinse  7 mL Mouth Rinse BID  . azithromycin  500 mg Oral Daily  . cefpodoxime  200 mg Oral Q12H  . DULoxetine  60 mg Oral Daily  . heparin  5,000 Units Subcutaneous 3 times per day  . ipratropium-albuterol  3 mL Nebulization Q6H  . predniSONE  50 mg Oral Q breakfast  . sodium chloride  3 mL Intravenous Q12H  . sodium chloride  3 mL Intravenous Q12H  . verapamil  120 mg Oral q morning - 10a   Continuous Infusions:   Time spent: > 35 minutes  Velvet Bathe  [facility] Pager [phone]. If 7PM-7AM, please contact night-coverage at [email or web address], password [email or web address] 12/24/2015, 2:13 PM  LOS: 5 days

## 2015-12-25 ENCOUNTER — Encounter (HOSPITAL_COMMUNITY): Payer: Self-pay | Admitting: Internal Medicine

## 2015-12-25 DIAGNOSIS — G9340 Encephalopathy, unspecified: Secondary | ICD-10-CM

## 2015-12-25 DIAGNOSIS — R410 Disorientation, unspecified: Secondary | ICD-10-CM | POA: Diagnosis present

## 2015-12-25 DIAGNOSIS — J9620 Acute and chronic respiratory failure, unspecified whether with hypoxia or hypercapnia: Secondary | ICD-10-CM | POA: Diagnosis present

## 2015-12-25 DIAGNOSIS — J449 Chronic obstructive pulmonary disease, unspecified: Secondary | ICD-10-CM

## 2015-12-25 DIAGNOSIS — F19921 Other psychoactive substance use, unspecified with intoxication with delirium: Secondary | ICD-10-CM

## 2015-12-25 DIAGNOSIS — I10 Essential (primary) hypertension: Secondary | ICD-10-CM

## 2015-12-25 DIAGNOSIS — J9621 Acute and chronic respiratory failure with hypoxia: Secondary | ICD-10-CM

## 2015-12-25 MED ORDER — IPRATROPIUM-ALBUTEROL 0.5-2.5 (3) MG/3ML IN SOLN
3.0000 mL | Freq: Three times a day (TID) | RESPIRATORY_TRACT | Status: DC
  Administered 2015-12-25 – 2015-12-26 (×5): 3 mL via RESPIRATORY_TRACT
  Filled 2015-12-25 (×5): qty 3

## 2015-12-25 MED ORDER — ALBUTEROL SULFATE (2.5 MG/3ML) 0.083% IN NEBU
2.5000 mg | INHALATION_SOLUTION | RESPIRATORY_TRACT | Status: DC | PRN
  Administered 2015-12-26: 2.5 mg via RESPIRATORY_TRACT
  Filled 2015-12-25: qty 3

## 2015-12-25 MED ORDER — ALPRAZOLAM 0.25 MG PO TABS
0.2500 mg | ORAL_TABLET | Freq: Two times a day (BID) | ORAL | Status: DC | PRN
  Administered 2015-12-25 – 2015-12-26 (×3): 0.25 mg via ORAL
  Filled 2015-12-25 (×3): qty 1

## 2015-12-25 MED ORDER — POLYETHYLENE GLYCOL 3350 17 G PO PACK
17.0000 g | PACK | Freq: Every day | ORAL | Status: DC
  Administered 2015-12-25 – 2015-12-26 (×2): 17 g via ORAL
  Filled 2015-12-25 (×2): qty 1

## 2015-12-25 NOTE — Progress Notes (Signed)
Progress Note   JANET GOELZ [id number] DOB: 24-Oct-1936 DOA: 12/19/2015 PCP: [name], MD   Brief Narrative:   SELENY SMULLEN is an 79 y.o. female the PMH of chronic respiratory failure secondary to COPD, on 3 L of home oxygen, who was admitted 12/19/15 with sepsis secondary to community-acquired pneumonia. Hospital course complicated by the development of delirium secondary to Ambien which has been discontinued.  Assessment/Plan:   Principal Problem:   Sepsis ([place]) secondary to community-acquired pneumonia - Sepsis resolved with IV antibiotics: Rocephin/azithromycin. - Blood cultures negative.  Active Problems:   Anxiety - Already on Cymbalta.  Will add low dose Xanax.    Acute on chronic respiratory failure with hypoxia (HCC)/COPD - Oxygen weaned to her usual home dose of 3.5 L/min.    Hypertension - Continue verapamil.    Acute encephalopathy/Drug-induced delirium ([place]) - Secondary to Ambien which has been discontinued.    DVT Prophylaxis - Heparin ordered.   Family Communication/Anticipated D/C date and plan/Code Status   Family Communication: No family at the bedside. Disposition Plan: Home tomorrow pending PT evaluation.  Lives with son. Anticipated D/C date:   12/26/15 if PT agrees she is stable for home placement. Code Status: Full code.   IV Access:    Peripheral IV   Procedures and diagnostic studies:   Dg Chest 2 View  12/19/2015  CLINICAL DATA:  Worsening shortness of breath and chest pain over 3 days EXAM: CHEST - 2 VIEW COMPARISON:  04/16/2014 FINDINGS: Cardiac shadow is stable. The right lung remains clear. There is diffuse infiltrate identified in the right lung base as well as associated effusion. This extends superiorly along the lateral chest wall. Changes of prior vertebral augmentation are seen. IMPRESSION: New left basilar infiltrate with associated effusion. Followup PA and lateral chest X-ray is recommended in 3-4  weeks following trial of antibiotic therapy to ensure resolution and exclude underlying malignancy. Electronically Signed   By: Inez Catalina M.D.   On: 12/19/2015 08:42   Dg Chest Port 1 View  12/20/2015  CLINICAL DATA:  Community acquired pneumonia. EXAM: PORTABLE CHEST 1 VIEW COMPARISON:  12/19/2015 FINDINGS: The patient is rotated to the right, limiting evaluation of the mediastinum. The cardiac silhouette is not grossly enlarged. Thoracic aortic calcification is noted. The patient has taken a shallower inspiration than on the prior study. There is mild pulmonary vascular congestion and diffuse interstitial prominence which is new from the prior study. Subsegmental atelectasis is present in the right lung base. Left basilar consolidation does not appear significantly changed, again with an associated small left pleural effusion. No pneumothorax is identified. IMPRESSION: 1. Unchanged left basilar consolidation consistent with pneumonia. Left pleural effusion. 2. Hypoinflation with new pulmonary vascular congestion and diffuse interstitial prominence. Electronically Signed   By: Logan Bores M.D.   On: 12/20/2015 07:27     Medical Consultants:    PCCM: Marshell Garfinkel, MD  Anti-Infectives:   Rocephin 12/19/15---> 12/23/15 Azithromycin 12/19/15---> 12/23/15 Cefpodoxime 12/23/15--->   Subjective:   KONICA MCHARG says her breathing is better, but still has a tightness in the chest, especially on the right.  No cough.  No N/V.  Appetite poor.  Last BM was several days ago, but says she hasn't eaten much. Ambulated in hall yesterday. Did well but had some pleuritic pain.  Objective:    Filed Vitals:   12/24/15 1957 12/24/15 2119 12/25/15 0533 12/25/15 0825  BP:  131/57 156/60   Pulse:  98 73  Temp:  98.1 F (36.7 C) 97.8 F (36.6 C)   TempSrc:  Oral Oral   Resp:  16 16   Height:      Weight:      SpO2: 98% 94% 99% 95%    Intake/Output Summary (Last 24 hours) at 12/25/15  0856 Last data filed at 12/24/15 1258  Gross per 24 hour  Intake    480 ml  Output      0 ml  Net    480 ml   Filed Weights   12/19/15 1100 12/20/15 0026  Weight: 65.7 kg (144 lb 13.5 oz) 54.8 kg (120 lb 13 oz)    Exam: Gen:  NAD Cardiovascular:  RRR, No M/R/G Respiratory:  Lungs markedly diminished Gastrointestinal:  Abdomen soft, NT/ND, + BS Extremities:  No C/E/C   Data Reviewed:    Labs: Basic Metabolic Panel:  Recent Labs Lab 12/19/15 0827 12/19/15 1201 12/20/15 0810 12/22/15 0509  NA 140 136 139 143  K 3.9 3.9 4.2 4.2  CL 88* 85* 92* 94*  CO2 37* 34* 36* 39*  GLUCOSE 186* 383* 185* 109*  BUN 22* 22* 22* 19  CREATININE 0.75 0.87 0.68 0.60  CALCIUM 9.5 9.1 8.7* 9.1  MG  --  2.2  --   --   PHOS  --  3.9  --   --    GFR Estimated Creatinine Clearance: 43.2 mL/min (by C-G formula based on Cr of 0.6). Liver Function Tests:  Recent Labs Lab 12/19/15 0827 12/19/15 1201  AST 23 27  ALT 22 22  ALKPHOS 81 76  BILITOT 0.7 0.2*  PROT 8.5* 7.9  ALBUMIN 3.3* 3.1*    Recent Labs Lab 12/19/15 0827  LIPASE 26   Coagulation profile  Recent Labs Lab 12/19/15 1201  INR 1.20    CBC:  Recent Labs Lab 12/19/15 0827 12/19/15 1201 12/20/15 0810 12/22/15 0509 12/23/15 0541 12/24/15 0521  WBC 19.9* 22.7* 20.7* 16.3* 15.1* 14.6*  NEUTROABS 15.8* 21.6*  --   --   --   --   HGB 11.3* 10.1* 9.3* 10.2* 9.5* 10.5*  HCT 37.5 33.4* 30.8* 34.4* 31.2* 34.5*  MCV 95.4 93.0 93.6 93.7 93.4 93.0  PLT 493* 489* 491* 585* 463* 520*   Sepsis Labs:  Recent Labs Lab 12/19/15 1201 12/19/15 1506 12/20/15 0810 12/20/15 0929 12/21/15 0510 12/22/15 0509 12/23/15 0541 12/24/15 0521  PROCALCITON 0.11  --   --   --  <0.10  --   --   --   WBC 22.7*  --  20.7*  --   --  16.3* 15.1* 14.6*  LATICACIDVEN 4.4* 3.6*  --  1.0  --   --   --   --    Microbiology Recent Results (from the past 240 hour(s))  Blood culture (routine x 2)     Status: None   Collection Time:  12/19/15  9:45 AM  Result Value Ref Range Status   Specimen Description BLOOD LEFT ARM  Final   Special Requests BOTTLES DRAWN AEROBIC AND ANAEROBIC 5 CC  Final   Culture   Final    NO GROWTH 5 DAYS Performed at [facility]    Report Status 12/24/2015 FINAL  Final  Blood culture (routine x 2)     Status: None   Collection Time: 12/19/15  9:55 AM  Result Value Ref Range Status   Specimen Description BLOOD RIGHT HAND  Final   Special Requests BOTTLES DRAWN AEROBIC AND ANAEROBIC 5 CC  Final   Culture   Final    NO GROWTH 5 DAYS Performed at [facility]    Report Status 12/24/2015 FINAL  Final  MRSA PCR Screening     Status: None   Collection Time: 12/19/15 11:01 AM  Result Value Ref Range Status   MRSA by PCR NEGATIVE NEGATIVE Final    Comment:        The GeneXpert MRSA Assay (FDA approved for NASAL specimens only), is one component of a comprehensive MRSA colonization surveillance program. It is not intended to diagnose MRSA infection nor to guide or monitor treatment for MRSA infections.      Medications:   . antiseptic oral rinse  7 mL Mouth Rinse BID  . azithromycin  500 mg Oral Daily  . cefpodoxime  200 mg Oral Q12H  . DULoxetine  60 mg Oral Daily  . heparin  5,000 Units Subcutaneous 3 times per day  . ipratropium-albuterol  3 mL Nebulization TID  . predniSONE  50 mg Oral Q breakfast  . sodium chloride  3 mL Intravenous Q12H  . sodium chloride  3 mL Intravenous Q12H  . verapamil  120 mg Oral q morning - 10a   Continuous Infusions:   Time spent: 25 minutes.   LOS: 6 days   [facility] Pager [phone]. If unable to reach me by pager, please call my cell phone at [phone].  *Please refer to [email or web address], password [email or web address] to get updated schedule on who will round on this patient, as hospitalists switch teams weekly. If 7PM-7AM, please contact night-coverage at [email or web address], password [email or web address] for any overnight  needs.  12/25/2015, 8:56 AM

## 2015-12-26 MED ORDER — ALPRAZOLAM 0.25 MG PO TABS: 0.2500 mg | ORAL_TABLET | Freq: Two times a day (BID) | ORAL | Status: DC | PRN

## 2015-12-26 MED ORDER — PREDNISONE 10 MG (21) PO TBPK: ORAL_TABLET | ORAL | Status: DC

## 2015-12-26 NOTE — Progress Notes (Signed)
Patient given discharge instructions, and verbalized an understanding of all discharge instructions.  Patient agrees with discharge plan, and is being discharged in stable medical condition.  Patient given transportation via [facility]. 

## 2015-12-26 NOTE — Clinical Social Work Note (Signed)
CSW arranged transportation for pt via [facility]. Transport form was given to RN, Shanon Brow for d/c.   Cindra Presume, LCSW [phone] Hospital [facility] & 5E, 5W [id number] Licensed Clinical Social Worker

## 2015-12-26 NOTE — Discharge Summary (Signed)
Physician Discharge Summary  KAZIYAH PIGNOTTI [id number] DOB: 1936-07-22 DOA: 12/19/2015  PCP: [name], MD  Admit date: 12/19/2015 Discharge date: 12/26/2015   Recommendations for Outpatient Follow-Up:   1. Recommend F/U with PCP in 1 week to ensure stability. 2. Home health PT, RN, aide set up.   Discharge Diagnosis:   Principal Problem:    Sepsis ([place]) secondary to CAP Active Problems:    Acute respiratory failure with hypoxia (HCC)    Hypertension    COPD (chronic obstructive pulmonary disease) ([place])    CAP (community acquired pneumonia)    Acute encephalopathy    Drug-induced delirium ([place])    Acute on chronic respiratory failure [facility])   Discharge disposition:  Home.    Discharge Condition: Improved.  Diet recommendation: Low sodium, heart healthy.     History of Present Illness:   [name] is an 79 y.o. female the PMH of chronic respiratory failure secondary to COPD, on 3 L of home oxygen, who was admitted 12/19/15 with sepsis secondary to community-acquired pneumonia. Hospital course complicated by the development of delirium secondary to Ambien which has been discontinued.   Hospital Course by Problem:      Principal Problem:  Sepsis ([place]) secondary to community-acquired pneumonia - Sepsis resolved with IV antibiotics: Rocephin/azithromycin. - Blood cultures negative.  Active Problems:  Anxiety - Already on Cymbalta. Will add low dose Xanax.   Acute on chronic respiratory failure with hypoxia (HCC)/COPD - Oxygen weaned to her usual home dose of 3.5 L/min.   Hypertension - Continue verapamil.   Acute encephalopathy/Drug-induced delirium ([place]) - Secondary to Ambien which has been discontinued.      Medical Consultants:    None.   Discharge Exam:   Filed Vitals:   12/25/15 2112 12/26/15 0642  BP: 134/58 136/62  Pulse: 82 76  Temp: 97.6 F (36.4 C) 97.9 F (36.6 C)  Resp: 18 19   Filed Vitals:   12/25/15 2034 12/25/15 2112 12/26/15 0642 12/26/15 0903  BP:  134/58 136/62   Pulse:  82 76   Temp:  97.6 F (36.4 C) 97.9 F (36.6 C)   TempSrc:  Oral Oral   Resp:  18 19   Height:      Weight:      SpO2: 98% 97% 99% 98%    Gen:  NAD Cardiovascular:  RRR, No M/R/G Respiratory: Lungs diminished Gastrointestinal: Abdomen soft, NT/ND with normal active bowel sounds. Extremities: No C/E/C   The results of significant diagnostics from this hospitalization (including imaging, microbiology, ancillary and laboratory) are listed below for reference.     Procedures and Diagnostic Studies:   Dg Chest 2 View  12/19/2015  CLINICAL DATA:  Worsening shortness of breath and chest pain over 3 days EXAM: CHEST - 2 VIEW COMPARISON:  04/16/2014 FINDINGS: Cardiac shadow is stable. The right lung remains clear. There is diffuse infiltrate identified in the right lung base as well as associated effusion. This extends superiorly along the lateral chest wall. Changes of prior vertebral augmentation are seen. IMPRESSION: New left basilar infiltrate with associated effusion. Followup PA and lateral chest X-ray is recommended in 3-4 weeks following trial of antibiotic therapy to ensure resolution and exclude underlying malignancy. Electronically Signed   By: Inez Catalina M.D.   On: 12/19/2015 08:42   Dg Chest Port 1 View  12/20/2015  CLINICAL DATA:  Community acquired pneumonia. EXAM: PORTABLE CHEST 1 VIEW COMPARISON:  12/19/2015 FINDINGS: The patient is rotated to the right, limiting  evaluation of the mediastinum. The cardiac silhouette is not grossly enlarged. Thoracic aortic calcification is noted. The patient has taken a shallower inspiration than on the prior study. There is mild pulmonary vascular congestion and diffuse interstitial prominence which is new from the prior study. Subsegmental atelectasis is present in the right lung base. Left basilar consolidation does not appear significantly changed,  again with an associated small left pleural effusion. No pneumothorax is identified. IMPRESSION: 1. Unchanged left basilar consolidation consistent with pneumonia. Left pleural effusion. 2. Hypoinflation with new pulmonary vascular congestion and diffuse interstitial prominence. Electronically Signed   By: Logan Bores M.D.   On: 12/20/2015 07:27     Labs:   Basic Metabolic Panel:  Recent Labs Lab 12/20/15 0810 12/22/15 0509  NA 139 143  K 4.2 4.2  CL 92* 94*  CO2 36* 39*  GLUCOSE 185* 109*  BUN 22* 19  CREATININE 0.68 0.60  CALCIUM 8.7* 9.1   GFR Estimated Creatinine Clearance: 43.2 mL/min (by C-G formula based on Cr of 0.6).  CBC:  Recent Labs Lab 12/20/15 0810 12/22/15 0509 12/23/15 0541 12/24/15 0521  WBC 20.7* 16.3* 15.1* 14.6*  HGB 9.3* 10.2* 9.5* 10.5*  HCT 30.8* 34.4* 31.2* 34.5*  MCV 93.6 93.7 93.4 93.0  PLT 491* 585* 463* 520*   Microbiology Recent Results (from the past 240 hour(s))  Blood culture (routine x 2)     Status: None   Collection Time: 12/19/15  9:45 AM  Result Value Ref Range Status   Specimen Description BLOOD LEFT ARM  Final   Special Requests BOTTLES DRAWN AEROBIC AND ANAEROBIC 5 CC  Final   Culture   Final    NO GROWTH 5 DAYS Performed at [facility]    Report Status 12/24/2015 FINAL  Final  Blood culture (routine x 2)     Status: None   Collection Time: 12/19/15  9:55 AM  Result Value Ref Range Status   Specimen Description BLOOD RIGHT HAND  Final   Special Requests BOTTLES DRAWN AEROBIC AND ANAEROBIC 5 CC  Final   Culture   Final    NO GROWTH 5 DAYS Performed at [facility]    Report Status 12/24/2015 FINAL  Final  MRSA PCR Screening     Status: None   Collection Time: 12/19/15 11:01 AM  Result Value Ref Range Status   MRSA by PCR NEGATIVE NEGATIVE Final    Comment:        The GeneXpert MRSA Assay (FDA approved for NASAL specimens only), is one component of a comprehensive MRSA colonization surveillance  program. It is not intended to diagnose MRSA infection nor to guide or monitor treatment for MRSA infections.      Discharge Instructions:   Discharge Instructions    Call MD for:  extreme fatigue    Complete by:  As directed      Call MD for:  severe uncontrolled pain    Complete by:  As directed      Call MD for:  temperature >100.4    Complete by:  As directed      Diet - low sodium heart healthy    Complete by:  As directed      Face-to-face encounter (required for Medicare/Medicaid patients)    Complete by:  As directed   I Issiah Huffaker certify that this patient is under my care and that I, or a nurse practitioner or physician's assistant working with me, had a face-to-face encounter that meets the physician  face-to-face encounter requirements with this patient on 12/26/2015. The encounter with the patient was in whole, or in part for the following medical condition(s) which is the primary reason for home health care (List medical condition): Deconditioned, lives alone, will need help with ADLs, PT for strength training, and an RN to assess stability given deconditioning.  The encounter with the patient was in whole, or in part, for the following medical condition, which is the primary reason for home health care:  Deconditioning post hosp for PNA  I certify that, based on my findings, the following services are medically necessary home health services:   Nursing Physical therapy    Reason for Medically Necessary Home Health Services:   Skilled Nursing- Skilled Assessment/Observation Skilled Nursing- Change/Decline in Patient Status Skilled Nursing- Changes in Medication/Medication Management Therapy- Home Adaptation to Facilitate Safety    My clinical findings support the need for the above services:  Unable to leave home safely without assistance and/or assistive device  Further, I certify that my clinical findings support that this patient is homebound due to:  Unable to  leave home safely without assistance     Home Health    Complete by:  As directed   To provide the following care/treatments:   PT RN Home Health Aide       Increase activity slowly    Complete by:  As directed             Medication List    TAKE these medications        ALPRAZolam 0.25 MG tablet  Commonly known as:  XANAX  Take 1 tablet (0.25 mg total) by mouth 2 (two) times daily as needed for anxiety.     DULoxetine 60 MG capsule  Commonly known as:  CYMBALTA  Take 60 mg by mouth daily.     predniSONE 10 MG (21) Tbpk tablet  Commonly known as:  STERAPRED UNI-PAK 21 TAB  Take 6 tabs tomorrow, then decrease by 1 tab daily until all pills gone.     sodium chloride 0.65 % Soln nasal spray  Commonly known as:  OCEAN  Place 2 sprays into both nostrils 2 (two) times daily as needed for congestion.     verapamil 120 MG CR tablet  Commonly known as:  CALAN-SR  Take 120 mg by mouth every morning.           Follow-up Information    Follow up with [name], MD In 1 week.   Specialty:  Family Medicine   Why:  Hospital follow up.   Contact information:   [place] [address] [phone]        Time coordinating discharge: 35 minutes.  Signed:  Shearon Clonch  Pager [phone] [facility] 12/26/2015, 1:56 PM

## 2015-12-26 NOTE — Evaluation (Signed)
Physical Therapy Evaluation Patient Details Name: [name] MRN: [id number] DOB: 1936-11-04 Today's Date: 12/26/2015   History of Present Illness  79 yo female admitted with sepsis.   Clinical Impression  On eval, pt was Min guard assist for mobility-walked ~115 feet with RW. O2 sats 91% on 4L O2 during ambulation (pt  wears 3.5L at baseline). Pt states her son lives with her and that she would like to d/c home. Recommend HHPT, [place], and home health aide (if possible).     Follow Up Recommendations Home health PT;Supervision/Assistance - 24 hour; HHOT; [place] (if possible)    Equipment Recommendations  None recommended by PT    Recommendations for Other Services       Precautions / Restrictions Precautions Precautions: Fall Precaution Comments: O2 dep Restrictions Weight Bearing Restrictions: No      Mobility  Bed Mobility               General bed mobility comments: sitting EOB  Transfers Overall transfer level: Needs assistance Equipment used: Rolling walker (2 wheeled) Transfers: Sit to/from Stand Sit to Stand: Min guard         General transfer comment: close guard for safety. Increased time.   Ambulation/Gait Ambulation/Gait assistance: Min guard Ambulation Distance (Feet): 115 Feet Assistive device: Rolling walker (2 wheeled) Gait Pattern/deviations: Step-through pattern;Decreased stride length     General Gait Details: close guard for safety. dyspneas 2/4. O2 sats 91% on 4L O2 during ambulation.  Stairs            Wheelchair Mobility    Modified Rankin (Stroke Patients Only)       Balance Overall balance assessment: Needs assistance         Standing balance support: During functional activity Standing balance-Leahy Scale: Fair Standing balance comment: RW required at this time                             Pertinent Vitals/Pain Pain Assessment: No/denies pain    Home Living Family/patient expects  to be discharged to:: Private residence Living Arrangements: Children (son lives with her (he does not work per pt)) Available Help at Discharge: Family Type of Home: House Home Access: Stairs to enter   Technical brewer of Steps: 2 Home Layout: One level [place]: Environmental consultant - 2 wheels      Prior Function Level of Independence: Needs assistance   Gait / Transfers Assistance Needed: ambulating without device prior to admission.           Hand Dominance        Extremity/Trunk Assessment   Upper Extremity Assessment: Generalized weakness           Lower Extremity Assessment: Generalized weakness      Cervical / Trunk Assessment: Kyphotic  Communication   Communication: No difficulties  Cognition Arousal/Alertness: Awake/alert Behavior During Therapy: WFL for tasks assessed/performed Overall Cognitive Status: Within Functional Limits for tasks assessed                      General Comments      Exercises        Assessment/Plan    PT Assessment Patient needs continued PT services  PT Diagnosis Difficulty walking;Generalized weakness   PT Problem List Decreased strength;Decreased activity tolerance;Decreased balance;Decreased mobility  PT Treatment Interventions DME instruction;Gait training;Functional mobility training;Therapeutic activities;Patient/family education;Therapeutic exercise;Balance training   PT Goals (Current goals can be found in  the Care Plan section) Acute Rehab PT Goals Patient Stated Goal: home today! PT Goal Formulation: With patient Time For Goal Achievement: 01/09/16 Potential to Achieve Goals: Good    Frequency Min 3X/week   Barriers to discharge        Co-evaluation               End of Session Equipment Utilized During Treatment: Oxygen;Gait belt Activity Tolerance: Patient tolerated treatment well Patient left: in bed;with call bell/phone within reach           Time: 1101-1118 PT Time  Calculation (min) (ACUTE ONLY): 17 min   Charges:   PT Evaluation $Initial PT Evaluation Tier I: 1 Procedure     PT G Codes:        Weston Anna, MPT Pager: [phone]

## 2015-12-26 NOTE — Progress Notes (Signed)
[id number] accept patient for RN, PT and Aide are not covered under Medicaid glines/tct-Dr. Rama-made aware of the above/Akeen Ledyard,RN,BSN,CCM.

## 2016-01-16 NOTE — H&P (Signed)
[facility] History and Physical  [name] [id number] DOB: 12/16/1936 DOA: 12/19/2015  Referring physician: ED physician PCP: [name], MD   Chief Complaint: SOB  HPI: [name] is a 80 y.o. female  80 year old female with history of COPD on 3.5 L of supplemental oxygen via nasal cannula chronically, hypertension, osteoporosis, presented to the ED complaining of chest discomfort. I-STAT troponin negative. Imaging studies specifically chest x-ray reported new left basilar infiltrate with associated effusion. Patient was referred to [place] for further evaluation recommendations.   Review of Systems:  12 point review of system reviewed and negative unless otherwise mentioned above  Past Medical History  Diagnosis Date  . COPD (chronic obstructive pulmonary disease) ([place])   . Chronic back pain   . Osteoporosis   . On home oxygen therapy 06-14-13    continuos 2.5l/m nasally-24/7  . Hypertension   . Colitis 03/19/2014   Past Surgical History  Procedure Laterality Date  . Abdominal hysterectomy    . Sacroplasty  06-14-13    05-10-13 IVR [facility] for fracture stabilization  . Esophagogastroduodenoscopy N/A 06/15/2013    Procedure: ESOPHAGOGASTRODUODENOSCOPY (EGD);  Surgeon: [name], MD;  Location: [facility];  Service: Endoscopy;  Laterality: N/A;   Social History:  reports that she quit smoking about 4 years ago. Her smoking use included Cigarettes. She has never used smokeless tobacco. She reports that she does not drink alcohol or use illicit drugs.  Allergies  Allergen Reactions  . Ambien [Zolpidem] Other (See Comments)    Hallucinations and paranoid    Family hx - None reported to me  Prior to Admission medications   Medication Sig Start Date End Date Taking? Authorizing Provider  DULoxetine (CYMBALTA) 60 MG capsule Take 60 mg by mouth daily.   Yes Historical Provider, MD  sodium chloride (OCEAN) 0.65 % SOLN nasal spray Place 2 sprays  into both nostrils 2 (two) times daily as needed for congestion.  03/13/14  Yes Janece Canterbury, MD  verapamil (CALAN-SR) 120 MG CR tablet Take 120 mg by mouth every morning.    Yes Historical Provider, MD  ALPRAZolam (XANAX) 0.25 MG tablet Take 1 tablet (0.25 mg total) by mouth 2 (two) times daily as needed for anxiety. 12/26/15   Christina P Rama, MD  predniSONE (STERAPRED UNI-PAK 21 TAB) 10 MG (21) TBPK tablet Take 6 tabs tomorrow, then decrease by 1 tab daily until all pills gone. 12/26/15   Venetia Maxon Rama, MD   Physical Exam: Filed Vitals:   12/25/15 2112 12/26/15 0642 12/26/15 0903 12/26/15 1506  BP: 134/58 136/62  114/47  Pulse: 82 76  76  Temp: 97.6 F (36.4 C) 97.9 F (36.6 C)  98 F (36.7 C)  TempSrc: Oral Oral  Oral  Resp: 18 19  18   Height:      Weight:      SpO2: 97% 99% 98% 97%    Wt Readings from Last 3 Encounters:  12/20/15 54.8 kg (120 lb 13 oz)  03/21/14 64.003 kg (141 lb 1.6 oz)  03/10/14 64.1 kg (141 lb 5 oz)     General: Patient in no acute distress, alert and awake, confused  Cardiovascular: S1 and S2 within normal limits, no rubs  Respiratory: No increased work of breathing, no wheezes, equal chest rise, decreased breath sounds over left lung field  Abdomen: Soft, nondistended, nontender  Musculoskeletal: No cyanosis or clubbing Psychiatric: grossly normal mood and affect, speech fluent and appropriate Neurologic: grossly non-focal.  Labs on Admission:  Basic Metabolic Panel: No results for input(s): NA, K, CL, CO2, GLUCOSE, BUN, CREATININE, CALCIUM, MG, PHOS in the last 168 hours. Liver Function Tests: No results for input(s): AST, ALT, ALKPHOS, BILITOT, PROT, ALBUMIN in the last 168 hours. No results for input(s): LIPASE, AMYLASE in the last 168 hours. No results for input(s): AMMONIA in the last 168 hours. CBC: No results for input(s): WBC, NEUTROABS, HGB, HCT, MCV, PLT in the last 168 hours. Cardiac Enzymes: No results for  input(s): CKTOTAL, CKMB, CKMBINDEX, TROPONINI in the last 168 hours.  BNP (last 3 results) No results for input(s): BNP in the last 8760 hours.  ProBNP (last 3 results) No results for input(s): PROBNP in the last 8760 hours.  CBG: No results for input(s): GLUCAP in the last 168 hours.  Radiological Exams on Admission: No results found.  EKG: Independently reviewed. Sinus tachycardia with no ST elevations or depressions  Assessment/Plan Active Problems:  Sepsis ([place]) - resolving with antibiotics - f/u with cultures   CAP (community acquired pneumonia) - improving on current antibiotic therapy azithromycin and Rocephin  AMS/toxic encephalopathy - most likely delirium secondary to [place] - Will discontinue ambien - administered low dose haldol  Depression - continue cymbalta   Code Status: full DVT Prophylaxis: heparin  Time spent: > 35 minutes  Velvet Bathe [facility] Pager [phone]

## 2016-10-10 ENCOUNTER — Emergency Department (HOSPITAL_COMMUNITY): Payer: Medicare Other

## 2016-10-10 ENCOUNTER — Inpatient Hospital Stay (HOSPITAL_COMMUNITY)
Admission: EM | Admit: 2016-10-10 | Discharge: 2016-10-17 | DRG: 516 | Disposition: A | Discharge status: Home Health | Payer: Medicare Other | Attending: Internal Medicine | Admitting: Internal Medicine

## 2016-10-10 ENCOUNTER — Encounter (HOSPITAL_COMMUNITY): Payer: Self-pay

## 2016-10-10 DIAGNOSIS — W010XXA Fall on same level from slipping, tripping and stumbling without subsequent striking against object, initial encounter: Secondary | ICD-10-CM | POA: Diagnosis present

## 2016-10-10 DIAGNOSIS — M545 Low back pain: Secondary | ICD-10-CM | POA: Diagnosis not present

## 2016-10-10 DIAGNOSIS — F329 Major depressive disorder, single episode, unspecified: Secondary | ICD-10-CM | POA: Diagnosis present

## 2016-10-10 DIAGNOSIS — M48061 Spinal stenosis, lumbar region without neurogenic claudication: Secondary | ICD-10-CM | POA: Diagnosis present

## 2016-10-10 DIAGNOSIS — N3000 Acute cystitis without hematuria: Secondary | ICD-10-CM

## 2016-10-10 DIAGNOSIS — Z9981 Dependence on supplemental oxygen: Secondary | ICD-10-CM

## 2016-10-10 DIAGNOSIS — J9610 Chronic respiratory failure, unspecified whether with hypoxia or hypercapnia: Secondary | ICD-10-CM | POA: Diagnosis present

## 2016-10-10 DIAGNOSIS — R52 Pain, unspecified: Secondary | ICD-10-CM

## 2016-10-10 DIAGNOSIS — Z8249 Family history of ischemic heart disease and other diseases of the circulatory system: Secondary | ICD-10-CM

## 2016-10-10 DIAGNOSIS — J449 Chronic obstructive pulmonary disease, unspecified: Secondary | ICD-10-CM | POA: Diagnosis not present

## 2016-10-10 DIAGNOSIS — S32011A Stable burst fracture of first lumbar vertebra, initial encounter for closed fracture: Principal | ICD-10-CM | POA: Diagnosis present

## 2016-10-10 DIAGNOSIS — J9611 Chronic respiratory failure with hypoxia: Secondary | ICD-10-CM | POA: Diagnosis present

## 2016-10-10 DIAGNOSIS — Z87891 Personal history of nicotine dependence: Secondary | ICD-10-CM

## 2016-10-10 DIAGNOSIS — D649 Anemia, unspecified: Secondary | ICD-10-CM | POA: Diagnosis present

## 2016-10-10 DIAGNOSIS — S32010A Wedge compression fracture of first lumbar vertebra, initial encounter for closed fracture: Secondary | ICD-10-CM

## 2016-10-10 DIAGNOSIS — I159 Secondary hypertension, unspecified: Secondary | ICD-10-CM | POA: Diagnosis not present

## 2016-10-10 DIAGNOSIS — M810 Age-related osteoporosis without current pathological fracture: Secondary | ICD-10-CM | POA: Diagnosis present

## 2016-10-10 DIAGNOSIS — Z23 Encounter for immunization: Secondary | ICD-10-CM

## 2016-10-10 DIAGNOSIS — R339 Retention of urine, unspecified: Secondary | ICD-10-CM | POA: Diagnosis present

## 2016-10-10 DIAGNOSIS — S32029A Unspecified fracture of second lumbar vertebra, initial encounter for closed fracture: Secondary | ICD-10-CM | POA: Diagnosis present

## 2016-10-10 DIAGNOSIS — Y92003 Bedroom of unspecified non-institutional (private) residence as the place of occurrence of the external cause: Secondary | ICD-10-CM

## 2016-10-10 DIAGNOSIS — I10 Essential (primary) hypertension: Secondary | ICD-10-CM | POA: Diagnosis present

## 2016-10-10 DIAGNOSIS — Z9889 Other specified postprocedural states: Secondary | ICD-10-CM

## 2016-10-10 DIAGNOSIS — M549 Dorsalgia, unspecified: Secondary | ICD-10-CM

## 2016-10-10 HISTORY — DX: Dependence on supplemental oxygen: Z99.81

## 2016-10-10 LAB — URINE MICROSCOPIC-ADD ON: Squamous Epithelial / LPF: NONE SEEN

## 2016-10-10 LAB — CBC WITH DIFFERENTIAL/PLATELET
BASOS ABS: 0 10*3/uL (ref 0.0–0.1)
Basophils Relative: 0 %
EOS ABS: 0 10*3/uL (ref 0.0–0.7)
EOS PCT: 0 %
HCT: 36.7 % (ref 36.0–46.0)
Hemoglobin: 10.7 g/dL — ABNORMAL LOW (ref 12.0–15.0)
Lymphocytes Relative: 4 %
Lymphs Abs: 0.6 10*3/uL — ABNORMAL LOW (ref 0.7–4.0)
MCH: 25.8 pg — ABNORMAL LOW (ref 26.0–34.0)
MCHC: 29.2 g/dL — ABNORMAL LOW (ref 30.0–36.0)
MCV: 88.6 fL (ref 78.0–100.0)
Monocytes Absolute: 0.6 10*3/uL (ref 0.1–1.0)
Monocytes Relative: 4 %
Neutro Abs: 14.3 10*3/uL — ABNORMAL HIGH (ref 1.7–7.7)
Neutrophils Relative %: 92 %
PLATELETS: 394 10*3/uL (ref 150–400)
RBC: 4.14 MIL/uL (ref 3.87–5.11)
RDW: 14.1 % (ref 11.5–15.5)
WBC: 15.5 10*3/uL — AB (ref 4.0–10.5)

## 2016-10-10 LAB — URINALYSIS, ROUTINE W REFLEX MICROSCOPIC
BILIRUBIN URINE: NEGATIVE
Glucose, UA: NEGATIVE mg/dL
KETONES UR: NEGATIVE mg/dL
Leukocytes, UA: NEGATIVE
Nitrite: NEGATIVE
Protein, ur: NEGATIVE mg/dL
Specific Gravity, Urine: 1.019 (ref 1.005–1.030)
pH: 6.5 (ref 5.0–8.0)

## 2016-10-10 LAB — BASIC METABOLIC PANEL
ANION GAP: 7 (ref 5–15)
BUN: 15 mg/dL (ref 6–20)
CALCIUM: 8.7 mg/dL — AB (ref 8.9–10.3)
CO2: 33 mmol/L — ABNORMAL HIGH (ref 22–32)
Chloride: 96 mmol/L — ABNORMAL LOW (ref 101–111)
Creatinine, Ser: 0.56 mg/dL (ref 0.44–1.00)
GFR calc Af Amer: >60 mL/min (ref >60)
Glucose, Bld: 166 mg/dL — ABNORMAL HIGH (ref 65–99)
POTASSIUM: 4.1 mmol/L (ref 3.5–5.1)
SODIUM: 136 mmol/L (ref 135–145)

## 2016-10-10 MED ORDER — DOCUSATE SODIUM 100 MG PO CAPS
100.0000 mg | ORAL_CAPSULE | Freq: Two times a day (BID) | ORAL | Status: DC
Start: 2016-10-10 — End: 2016-10-16
  Administered 2016-10-10 – 2016-10-15 (×11): 100 mg via ORAL
  Filled 2016-10-10 (×11): qty 1

## 2016-10-10 MED ORDER — FENTANYL CITRATE (PF) 100 MCG/2ML IJ SOLN
100.0000 ug | Freq: Once | INTRAMUSCULAR | Status: AC
  Administered 2016-10-10: 100 ug via INTRAVENOUS
  Filled 2016-10-10: qty 2

## 2016-10-10 MED ORDER — OXYCODONE HCL 5 MG PO TABS
5.0000 mg | ORAL_TABLET | ORAL | Status: DC | PRN
  Administered 2016-10-10 – 2016-10-11 (×4): 5 mg via ORAL
  Filled 2016-10-10 (×4): qty 1

## 2016-10-10 MED ORDER — INFLUENZA VAC SPLIT QUAD 0.5 ML IM SUSY
0.5000 mL | PREFILLED_SYRINGE | INTRAMUSCULAR | Status: AC
  Administered 2016-10-11: 0.5 mL via INTRAMUSCULAR

## 2016-10-10 MED ORDER — ACETAMINOPHEN 650 MG RE SUPP: 650.0000 mg | Freq: Four times a day (QID) | RECTAL | Status: DC | PRN

## 2016-10-10 MED ORDER — MORPHINE SULFATE (PF) 2 MG/ML IV SOLN
1.0000 mg | INTRAVENOUS | Status: DC | PRN
  Administered 2016-10-11: 1 mg via INTRAVENOUS
  Administered 2016-10-12: 2 mg via INTRAVENOUS
  Administered 2016-10-12 (×2): 1 mg via INTRAVENOUS
  Administered 2016-10-13 – 2016-10-15 (×6): 2 mg via INTRAVENOUS
  Administered 2016-10-15: 1 mg via INTRAVENOUS
  Administered 2016-10-15 – 2016-10-17 (×8): 2 mg via INTRAVENOUS
  Filled 2016-10-10 (×20): qty 1

## 2016-10-10 MED ORDER — HEPARIN SODIUM (PORCINE) 5000 UNIT/ML IJ SOLN
5000.0000 [IU] | Freq: Three times a day (TID) | INTRAMUSCULAR | Status: DC
  Administered 2016-10-10 – 2016-10-11 (×2): 5000 [IU] via SUBCUTANEOUS
  Filled 2016-10-10 (×2): qty 1

## 2016-10-10 MED ORDER — DULOXETINE HCL 30 MG PO CPEP
30.0000 mg | ORAL_CAPSULE | Freq: Every day | ORAL | Status: DC
  Administered 2016-10-10 – 2016-10-11 (×2): 30 mg via ORAL
  Filled 2016-10-10 (×2): qty 1

## 2016-10-10 MED ORDER — ONDANSETRON HCL 4 MG PO TABS
4.0000 mg | ORAL_TABLET | Freq: Four times a day (QID) | ORAL | Status: DC | PRN
  Administered 2016-10-12: 4 mg via ORAL
  Filled 2016-10-10: qty 1

## 2016-10-10 MED ORDER — POLYETHYLENE GLYCOL 3350 17 G PO PACK
17.0000 g | PACK | Freq: Every day | ORAL | Status: DC | PRN
  Administered 2016-10-13 – 2016-10-14 (×2): 17 g via ORAL
  Filled 2016-10-10 (×2): qty 1

## 2016-10-10 MED ORDER — ONDANSETRON HCL 4 MG/2ML IJ SOLN
4.0000 mg | Freq: Four times a day (QID) | INTRAMUSCULAR | Status: DC | PRN
  Administered 2016-10-13 – 2016-10-17 (×3): 4 mg via INTRAVENOUS
  Filled 2016-10-10 (×3): qty 2

## 2016-10-10 MED ORDER — METHOCARBAMOL 500 MG PO TABS
500.0000 mg | ORAL_TABLET | Freq: Three times a day (TID) | ORAL | Status: DC | PRN
  Administered 2016-10-10: 500 mg via ORAL
  Filled 2016-10-10: qty 1

## 2016-10-10 MED ORDER — ALBUTEROL SULFATE (2.5 MG/3ML) 0.083% IN NEBU
2.5000 mg | INHALATION_SOLUTION | RESPIRATORY_TRACT | Status: DC | PRN
Start: 2016-10-10 — End: 2016-10-17

## 2016-10-10 MED ORDER — BISACODYL 10 MG RE SUPP
10.0000 mg | Freq: Every day | RECTAL | Status: DC | PRN
  Administered 2016-10-14: 10 mg via RECTAL
  Filled 2016-10-10: qty 1

## 2016-10-10 MED ORDER — ACETAMINOPHEN 325 MG PO TABS: 650.0000 mg | ORAL_TABLET | Freq: Four times a day (QID) | ORAL | Status: DC | PRN

## 2016-10-10 NOTE — ED Notes (Signed)
Upon instruction of Dr. Winfred Leeds, I attempt to assist pt. Out of bed to ambulate after waiting a few minutes after the second dose of IV pain medicine. I sit up the head of the bed; however, she simply found it intolerable d/t pain in her low back to sit up any higher than about 30 degrees; and I ultimately ceased the attempt and notified Dr. Lenna Sciara of same.

## 2016-10-10 NOTE — H&P (Signed)
TRH H&P   Patient Demographics:    [name], is a 80 y.o. female  MRN: [id number]   DOB - July 18, 1936  Admit Date - 10/10/2016  Outpatient Primary MD for the patient is [name], MD  Referring MD/NP/PA: Dr. Cathleen Fears  Patient coming from: Home  Chief Complaint  Patient presents with  . Weakness      HPI:    [name]  is a 80 y.o. female, With past medical history of COPD, chronic respiratory failure on 3 L nasal cannula, hypertension, osteoporosis, depression, patient presents with mechanical fall this morning and lower back pain, report she had a full 3 AM today, when she tripped on her oxygen tube, she denies any loss of consciousness, dizziness or lightheadedness, no shortness of breath, fever or chills or chest pain, worse of significant mid back pain after the fall, ED patient received IV pain medication 2, where she could not stand up secondary to severe pain, number spine were obtained, significant for L2 fracture, patient with history of kyphoplasty in the past, she is with no focal deficits. - Of note: Patient stopped taking all her medication a few months ago after her son passed away secondary to MI.    Review of systems:    In addition to the HPI above No Fever-chills, No Headache, No changes with Vision or hearing, No problems swallowing food or Liquids, No Chest pain, Cough or Shortness of Breath, No Abdominal pain, No Nausea or Vommitting, Bowel movements are regular, No Blood in stool or Urine, No dysuria, No new skin rashes or bruises, Complains of severe lower back pain No new weakness, tingling, numbness in any extremity, No recent weight gain or loss, No polyuria, polydypsia or polyphagia, She reports she is feeling sad that she lost her son, but no suicidal ideation or thoughts.  A full 10 point Review of Systems was done, except  as stated above, all other Review of Systems were negative.   With Past History of the following :    Past Medical History:  Diagnosis Date  . Chronic back pain   . Colitis 03/19/2014  . COPD (chronic obstructive pulmonary disease) ([place])   . Hypertension   . On home oxygen therapy 06-14-13   continuos 2.5l/m nasally-24/7  . Osteoporosis   . Oxygen dependent       Past Surgical History:  Procedure Laterality Date  . ABDOMINAL HYSTERECTOMY    . ESOPHAGOGASTRODUODENOSCOPY N/A 06/15/2013   Procedure: ESOPHAGOGASTRODUODENOSCOPY (EGD);  Surgeon: [name], MD;  Location: [facility];  Service: Endoscopy;  Laterality: N/A;  . sacroplasty  06-14-13   05-10-13 IVR CONE for fracture stabilization      Social History:     Social History  Substance Use Topics  . Smoking status: Former Smoker    Types: Cigarettes    Quit date: 06/15/2011  . Smokeless tobacco: Never Used  .  Alcohol use No     Lives - At home  Mobility - with assistance from her son, denies any cocaine use. To full today     Family History :   Significant for CAD in the family, son died at age of 37 because of MI   Home Medications:   Prior to Admission medications   Medication Sig Start Date End Date Taking? Authorizing Provider  ALPRAZolam (XANAX) 0.25 MG tablet Take 1 tablet (0.25 mg total) by mouth 2 (two) times daily as needed for anxiety. Patient not taking: Reported on 10/10/2016 12/26/15   Venetia Maxon Rama, MD     Allergies:     Allergies  Allergen Reactions  . Ambien [Zolpidem] Other (See Comments)    Hallucinations and paranoid     Physical Exam:   Vitals  Blood pressure 135/65, pulse 92, temperature 97.5 F (36.4 C), temperature source Oral, resp. rate 21, SpO2 95 %.   1. General Frail elderly female lying in bed in NAD,    2. Shunt appears to be depressed but Not Suicidal or Homicidal, Awake Alert, Oriented X 3.  3. No F.N deficits, ALL C.Nerves Intact, Strength 5/5 all  4 extremities, Sensation intact all 4 extremities, Plantars down going.  4. Ears and Eyes appear Normal, Conjunctivae clear, PERRLA. Moist Oral Mucosa.  5. Supple Neck, No JVD, No cervical lymphadenopathy appriciated, No Carotid Bruits.  6. Symmetrical Chest wall movement, Good air movement bilaterally, CTAB.  7. RRR, No Gallops, Rubs or Murmurs, No Parasternal Heave.  8. Positive Bowel Sounds, Abdomen Soft, No tenderness, No organomegaly appriciated,No rebound -guarding or rigidity.  9.  No Cyanosis, Normal Skin Turgor, No Skin Rash or Bruise.  10. Good muscle tone,  joints appear normal , no effusions, significant tenderness to palpation in mid back area  11. No Palpable Lymph Nodes in Neck or Axillae    Data Review:    CBC  Recent Labs Lab 10/10/16 1200  WBC 15.5*  HGB 10.7*  HCT 36.7  PLT 394  MCV 88.6  MCH 25.8*  MCHC 29.2*  RDW 14.1  LYMPHSABS 0.6*  MONOABS 0.6  EOSABS 0.0  BASOSABS 0.0   ------------------------------------------------------------------------------------------------------------------  Chemistries   Recent Labs Lab 10/10/16 1200  NA 136  K 4.1  CL 96*  CO2 33*  GLUCOSE 166*  BUN 15  CREATININE 0.56  CALCIUM 8.7*   ------------------------------------------------------------------------------------------------------------------ CrCl cannot be calculated (Unknown ideal weight.). ------------------------------------------------------------------------------------------------------------------ No results for input(s): TSH, T4TOTAL, T3FREE, THYROIDAB in the last 72 hours.  Invalid input(s): FREET3  Coagulation profile No results for input(s): INR, PROTIME in the last 168 hours. ------------------------------------------------------------------------------------------------------------------- No results for input(s): DDIMER in the last 72  hours. -------------------------------------------------------------------------------------------------------------------  Cardiac Enzymes No results for input(s): CKMB, TROPONINI, MYOGLOBIN in the last 168 hours.  Invalid input(s): CK ------------------------------------------------------------------------------------------------------------------ No results found for: BNP   ---------------------------------------------------------------------------------------------------------------  Urinalysis    Component Value Date/Time   COLORURINE YELLOW 10/10/2016 1200   APPEARANCEUR CLEAR 10/10/2016 1200   LABSPEC 1.019 10/10/2016 1200   PHURINE 6.5 10/10/2016 1200   GLUCOSEU NEGATIVE 10/10/2016 1200   HGBUR MODERATE (A) 10/10/2016 1200   BILIRUBINUR NEGATIVE 10/10/2016 1200   KETONESUR NEGATIVE 10/10/2016 1200   PROTEINUR NEGATIVE 10/10/2016 1200   UROBILINOGEN 0.2 03/19/2014 1657   NITRITE NEGATIVE 10/10/2016 1200   LEUKOCYTESUR NEGATIVE 10/10/2016 1200    ----------------------------------------------------------------------------------------------------------------   Imaging Results:    Dg Thoracic Spine 2 View  Result Date: 10/10/2016 CLINICAL DATA:  Tripped over oxygen tubing now  with generalized back pain, worse regional to T12. EXAM: THORACIC SPINE 2 VIEWS COMPARISON:  Chest radiograph- 12/19/2015 ; lumbar spine radiographs - earlier same day CT abdomen pelvis - 03/19/2014 FINDINGS: There is a very mild scoliotic curvature of the thoracolumbar spine, presumably degenerative in etiology. No definite anterolisthesis or retrolisthesis. Post cement augmentation of the T12 vertebral body with cement seen within the adjacent lumbar veins, unchanged compared to remote CT the abdomen pelvis performed 03/19/2014. Remaining thoracic and lumbar vertebral body heights appear preserved. Thoracic intervertebral disc space heights appear preserved. Limited visualization adjacent thorax  demonstrates atherosclerotic plaque within the thoracic aorta. Regional soft tissues appear normal. IMPRESSION: 1. No acute findings. 2. Post cement augmentation of T12. 3.  Aortic Atherosclerosis (ICD10-170.0) Electronically Signed   By: Sandi Mariscal M.D.   On: 10/10/2016 12:05   Dg Lumbar Spine Complete  Result Date: 10/10/2016 CLINICAL DATA:  Patient tripped over oxygen tubing now with generalized back pain. EXAM: LUMBAR SPINE - COMPLETE 4+ VIEW COMPARISON:  Thoracic spine radiographs - earlier same day ; CT abdomen pelvis - 03/19/2014 FINDINGS: There are 6 non rib-bearing lumbar type vertebral bodies. For the purposes of this dictation, lumbar levels be labeled L1 through L6. There is minimal (approximately 6 mm) of retrolisthesis of L4 upon L5, unchanged. Otherwise, normal alignment of the lumbar spine. Stable sequela of a T12, L4 and bilateral sacroplasty without interval change. Cement is again seen within draining lumbar veins at both the T12 and L4 vertebral bodies. Interval development of a mild (approximately 25%) compression deformity involving the superior endplate of the L2 vertebral body with approximately 5 mm of retropulsion at this level. Remaining lumbar vertebral body heights appear preserved. Mild to moderate multilevel lumbar spine DDD, worse at L3-L4 with disc space height loss, endplate irregularity and sclerosis. Limited visualization the bilateral SI joints is normal. Multiple phleboliths overlie the lower pelvis bilaterally, left greater than right. Calcified atherosclerotic plaque within the abdominal aorta. Regional bowel gas pattern is normal. IMPRESSION: 1. Transitional anatomy with spinal labeling as above. 2. Interval development of a presumably acute mild (approximately 25%) compression deformity involving the superior endplate of the L2 vertebral body with minimal (approximately 5 mm) of retropulsion at this level. Correlation for point tenderness at this location is  recommended. Further evaluation lumbar spine MRI could be performed as clinically indicated. 3. Stable sequela of cement augmentation of T12, L4 and bilateral sacroplasty. 4. Mild-to-moderate multilevel lumbar spine DDD, worse at L3-L4. Electronically Signed   By: Sandi Mariscal M.D.   On: 10/10/2016 12:10     Assessment & Plan:    Active Problems:   Hypertension   COPD (chronic obstructive pulmonary disease) (HCC)   Chronic respiratory failure (HCC)   Lower back pain  Lumbar 2 fracture/acute Lower Back Pain - This secondary to fall, and L2 fracture, discussed with IR Dr. Earleen Newport, patient would be a candidate for kyphoplasty, they will see tomorrow. - Continue with when necessary pain medication, muscle relaxant, laxatives, will consult PT.  Hypertension - Patient stop taking home medication , will monitor closely, if elevated will start on amlodipine .   Chronic hypoxic resp failure - at baseline, 3.5 L  COPD - stable, No wheezing, Cont with PRN albuterol  Brief reaction/depression - Lost her son 6 months ago, consult chaplain/spiritual care -We'll start on Cymbalta for depression ,it will help with pain as well(patient reports she was on it in the past but stopped it with rest of her medication)  DVT Prophylaxis Heparin - SCDs   AM Labs Ordered, also please review Full Orders  Family Communication: Admission, patients condition and plan of care including tests being ordered have been discussed with the patient  who indicate understanding and agree with the plan and Code Status.  Code Status Full, confirmed by patient  Likely DC to  Home/Pending PT  Condition GUARDED    Consults called: None  Admission status: Observation  Time spent in minutes : 50 minutes   [name] M.D on 10/10/2016 at 3:08 PM  Between 7am to 7pm - Pager - [phone]. After 7pm go to [email or web address] - password [facility]  [facility] - Office  [phone]

## 2016-10-10 NOTE — ED Notes (Signed)
She has just been seen by the hospitalist; and I take her to 6th floor at this time and give bedside report.

## 2016-10-10 NOTE — ED Notes (Signed)
She remains in no distress and is breathing easier--resp. Rate now 20.

## 2016-10-10 NOTE — ED Triage Notes (Signed)
She c/o feeling "weak" since yesterday. She phoned EMS this morning d/t persistent weakness and states she tripped and fell by her bed this morning. She c/o some non-radiating low back pain; and EMS found her to be wheezing, which was made much better/;nearly resolved with a Duoneb treatment. She was also given Zofran IV for mild nausea en route to hospital.  She arrives here in no distress. EMS were told by pt. That "My sone died, so I just quit going to doctors".

## 2016-10-10 NOTE — ED Notes (Signed)
She states she tripped "over my oxygen tube"; fell backward and struck a wall, then slid down same. She tells me she hurts in her (low) back; belly arms and legs". She moves extremities on command with ease and is oriented x 4 with clear speech. She is mild/mod. Short of breath and in no distress.

## 2016-10-10 NOTE — ED Provider Notes (Signed)
[facility] DEPT Provider Note   CSN: [id number] Arrival date & time: 10/10/16  0930     History   Chief Complaint Chief Complaint  Patient presents with  . Weakness   Chief complaint back pain after fall HPI [name] is a 80 y.o. female.  HPI patient tripped over her oxygen tank 3 AM today while on her home falling onto the floor. She complains of pain at midthoracic back and lower back since the fall. No other injury. She was not feeling weak at all. She did complain of some weakness 7 or 8 days ago which has since resolved. She denies any chest pain denies shortness of breath denies cough denies fever denies abdominal pain she complains of nonradiating back pain since the fall. Felt well prior to the fall at 3 AM today. Pain is worse with attempting to walk or move improved with remaining still. No treatment prior to coming here  Past Medical History:  Diagnosis Date  . Chronic back pain   . Colitis 03/19/2014  . COPD (chronic obstructive pulmonary disease) ([place])   . Hypertension   . On home oxygen therapy 06-14-13   continuos 2.5l/m nasally-24/7  . Osteoporosis   . Oxygen dependent     Patient Active Problem List   Diagnosis Date Noted  . Acute encephalopathy 12/25/2015  . Drug-induced delirium ([place]) 12/25/2015  . Acute on chronic respiratory failure ([place]) 12/25/2015  . CAP (community acquired pneumonia) 12/19/2015  . Chronic respiratory failure ([place]) 03/19/2014  . Depression 03/19/2014  . Aspiration pneumonia ([place]) 03/10/2014  . Acute respiratory failure with hypoxia ([place]) 03/10/2014  . Sepsis ([place]) 03/10/2014  . Hypokalemia 03/10/2014  . Hypertension 03/10/2014  . COPD (chronic obstructive pulmonary disease) ([place]) 03/10/2014  . Tobacco abuse 03/10/2014  . Anemia 06/15/2013  . Generalized abdominal pain 06/15/2013  . Blood in the stool 06/15/2013    Past Surgical History:  Procedure Laterality Date  . ABDOMINAL HYSTERECTOMY    .  ESOPHAGOGASTRODUODENOSCOPY N/A 06/15/2013   Procedure: ESOPHAGOGASTRODUODENOSCOPY (EGD);  Surgeon: [name], MD;  Location: [facility];  Service: Endoscopy;  Laterality: N/A;  . sacroplasty  06-14-13   05-10-13 IVR CONE for fracture stabilization    OB History    No data available       Home Medications    Prior to Admission medications   Medication Sig Start Date End Date Taking? Authorizing Provider  ALPRAZolam (XANAX) 0.25 MG tablet Take 1 tablet (0.25 mg total) by mouth 2 (two) times daily as needed for anxiety. 12/26/15   Christina P Rama, MD  DULoxetine (CYMBALTA) 60 MG capsule Take 60 mg by mouth daily.    Historical Provider, MD  predniSONE (STERAPRED UNI-PAK 21 TAB) 10 MG (21) TBPK tablet Take 6 tabs tomorrow, then decrease by 1 tab daily until all pills gone. 12/26/15   Venetia Maxon Rama, MD  sodium chloride (OCEAN) 0.65 % SOLN nasal spray Place 2 sprays into both nostrils 2 (two) times daily as needed for congestion.  03/13/14   Janece Canterbury, MD  verapamil (CALAN-SR) 120 MG CR tablet Take 120 mg by mouth every morning.     Historical Provider, MD    Family History No family history on file.  Social History Social History  Substance Use Topics  . Smoking status: Former Smoker    Types: Cigarettes    Quit date: 06/15/2011  . Smokeless tobacco: Never Used  . Alcohol use No     Allergies   Ambien [zolpidem]  Review of Systems Review of Systems  Constitutional: Negative.   HENT: Negative.   Respiratory: Negative.   Cardiovascular: Negative.   Gastrointestinal: Negative.   Musculoskeletal: Positive for back pain.  Skin: Negative.   Neurological: Negative.   Psychiatric/Behavioral: Negative.   All other systems reviewed and are negative.    Physical Exam Updated Vital Signs BP 175/69   Pulse 92   Temp 97.5 F (36.4 C) (Oral)   Resp 23   SpO2 94%   Physical Exam  Constitutional: She is oriented to person, place, and time. She appears  well-developed and well-nourished. She appears distressed.  Appears uncomfortable alert Glasgow Coma Score 15  HENT:  Head: Normocephalic and atraumatic.  Eyes: Conjunctivae are normal. Pupils are equal, round, and reactive to light.  Neck: Neck supple. No tracheal deviation present. No thyromegaly present.  Cardiovascular: Normal rate and regular rhythm.   No murmur heard. Pulmonary/Chest: Effort normal and breath sounds normal.  Abdominal: Soft. Bowel sounds are normal. She exhibits no distension. There is no tenderness.  Musculoskeletal: Normal range of motion. She exhibits no edema or tenderness.  Cervical spine nontender. Mild diffuse tenderness along thoracic and lumbar spine. No ecchymosis. pelvis stable nontender. all 4 extremities or contusion abrasion or tenderness neurovascularly intact  Neurological: She is alert and oriented to person, place, and time. Coordination normal.  Skin: Skin is warm and dry. No rash noted.  Psychiatric: She has a normal mood and affect.  Nursing note and vitals reviewed.    ED Treatments / Results  Labs (all labs ordered are listed, but only abnormal results are displayed) Labs Reviewed  CBC WITH DIFFERENTIAL/PLATELET  BASIC METABOLIC PANEL  URINALYSIS, ROUTINE W REFLEX MICROSCOPIC (NOT AT [facility])    EKG  EKG Interpretation None       Radiology No results found.  Procedures Procedures (including critical care time)  Medications Ordered in ED Medications  fentaNYL (SUBLIMAZE) injection 100 mcg (not administered)    X-rays viewed by me Results for orders placed or performed during the hospital encounter of 10/10/16  CBC with Differential/Platelet  Result Value Ref Range   WBC 15.5 (H) 4.0 - 10.5 K/uL   RBC 4.14 3.87 - 5.11 MIL/uL   Hemoglobin 10.7 (L) 12.0 - 15.0 g/dL   HCT 36.7 36.0 - 46.0 %   MCV 88.6 78.0 - 100.0 fL   MCH 25.8 (L) 26.0 - 34.0 pg   MCHC 29.2 (L) 30.0 - 36.0 g/dL   RDW 14.1 11.5 - 15.5 %   Platelets 394  150 - 400 K/uL   Neutrophils Relative % 92 %   Neutro Abs 14.3 (H) 1.7 - 7.7 K/uL   Lymphocytes Relative 4 %   Lymphs Abs 0.6 (L) 0.7 - 4.0 K/uL   Monocytes Relative 4 %   Monocytes Absolute 0.6 0.1 - 1.0 K/uL   Eosinophils Relative 0 %   Eosinophils Absolute 0.0 0.0 - 0.7 K/uL   Basophils Relative 0 %   Basophils Absolute 0.0 0.0 - 0.1 K/uL  Basic metabolic panel  Result Value Ref Range   Sodium 136 135 - 145 mmol/L   Potassium 4.1 3.5 - 5.1 mmol/L   Chloride 96 (L) 101 - 111 mmol/L   CO2 33 (H) 22 - 32 mmol/L   Glucose, Bld 166 (H) 65 - 99 mg/dL   BUN 15 6 - 20 mg/dL   Creatinine, Ser 0.56 0.44 - 1.00 mg/dL   Calcium 8.7 (L) 8.9 - 10.3 mg/dL   GFR calc  non Af Amer >60 >60 mL/min   GFR calc Af Amer >60 >60 mL/min   Anion gap 7 5 - 15  Urinalysis, Routine w reflex microscopic (not at [facility])  Result Value Ref Range   Color, Urine YELLOW YELLOW   APPearance CLEAR CLEAR   Specific Gravity, Urine 1.019 1.005 - 1.030   pH 6.5 5.0 - 8.0   Glucose, UA NEGATIVE NEGATIVE mg/dL   Hgb urine dipstick MODERATE (A) NEGATIVE   Bilirubin Urine NEGATIVE NEGATIVE   Ketones, ur NEGATIVE NEGATIVE mg/dL   Protein, ur NEGATIVE NEGATIVE mg/dL   Nitrite NEGATIVE NEGATIVE   Leukocytes, UA NEGATIVE NEGATIVE  Urine microscopic-add on  Result Value Ref Range   Squamous Epithelial / LPF NONE SEEN NONE SEEN   WBC, UA 0-5 0 - 5 WBC/hpf   RBC / HPF TOO NUMEROUS TO COUNT 0 - 5 RBC/hpf   Bacteria, UA RARE (A) NONE SEEN   Dg Thoracic Spine 2 View  Result Date: 10/10/2016 CLINICAL DATA:  Tripped over oxygen tubing now with generalized back pain, worse regional to T12. EXAM: THORACIC SPINE 2 VIEWS COMPARISON:  Chest radiograph- 12/19/2015 ; lumbar spine radiographs - earlier same day CT abdomen pelvis - 03/19/2014 FINDINGS: There is a very mild scoliotic curvature of the thoracolumbar spine, presumably degenerative in etiology. No definite anterolisthesis or retrolisthesis. Post cement augmentation of the  T12 vertebral body with cement seen within the adjacent lumbar veins, unchanged compared to remote CT the abdomen pelvis performed 03/19/2014. Remaining thoracic and lumbar vertebral body heights appear preserved. Thoracic intervertebral disc space heights appear preserved. Limited visualization adjacent thorax demonstrates atherosclerotic plaque within the thoracic aorta. Regional soft tissues appear normal. IMPRESSION: 1. No acute findings. 2. Post cement augmentation of T12. 3.  Aortic Atherosclerosis (ICD10-170.0) Electronically Signed   By: Sandi Mariscal M.D.   On: 10/10/2016 12:05   Dg Lumbar Spine Complete  Result Date: 10/10/2016 CLINICAL DATA:  Patient tripped over oxygen tubing now with generalized back pain. EXAM: LUMBAR SPINE - COMPLETE 4+ VIEW COMPARISON:  Thoracic spine radiographs - earlier same day ; CT abdomen pelvis - 03/19/2014 FINDINGS: There are 6 non rib-bearing lumbar type vertebral bodies. For the purposes of this dictation, lumbar levels be labeled L1 through L6. There is minimal (approximately 6 mm) of retrolisthesis of L4 upon L5, unchanged. Otherwise, normal alignment of the lumbar spine. Stable sequela of a T12, L4 and bilateral sacroplasty without interval change. Cement is again seen within draining lumbar veins at both the T12 and L4 vertebral bodies. Interval development of a mild (approximately 25%) compression deformity involving the superior endplate of the L2 vertebral body with approximately 5 mm of retropulsion at this level. Remaining lumbar vertebral body heights appear preserved. Mild to moderate multilevel lumbar spine DDD, worse at L3-L4 with disc space height loss, endplate irregularity and sclerosis. Limited visualization the bilateral SI joints is normal. Multiple phleboliths overlie the lower pelvis bilaterally, left greater than right. Calcified atherosclerotic plaque within the abdominal aorta. Regional bowel gas pattern is normal. IMPRESSION: 1. Transitional  anatomy with spinal labeling as above. 2. Interval development of a presumably acute mild (approximately 25%) compression deformity involving the superior endplate of the L2 vertebral body with minimal (approximately 5 mm) of retropulsion at this level. Correlation for point tenderness at this location is recommended. Further evaluation lumbar spine MRI could be performed as clinically indicated. 3. Stable sequela of cement augmentation of T12, L4 and bilateral sacroplasty. 4. Mild-to-moderate multilevel lumbar spine DDD, worse  at L3-L4. Electronically Signed   By: Sandi Mariscal M.D.   On: 10/10/2016 12:10   Initial Impression / Assessment and Plan / ED Course  I have reviewed the triage vital signs and the nursing notes.  Pertinent labs & imaging results that were available during my care of the patient were reviewed by me and considered in my medical decision making (see chart for details).  Clinical Course    3:40 PM after treatment with 2 doses of intravenous opioids pain medicine patient has too much pain to stand up. Dr.Elgergawy consulted and will arrange for overnight stay Compression fracture at L2 is felt to be acute in light of acute pain as result fall. Anemia is chronic Final Clinical Impressions(s) / ED Diagnoses  Diagnosis #1 lumbar compression fracture L2 #2 intractable pain #3 hyperglycemia Final diagnoses:  None    New Prescriptions New Prescriptions   No medications on file     Orlie Dakin, MD 10/10/16 1544

## 2016-10-10 NOTE — ED Notes (Signed)
Unable to collect labs patient is not in the room 

## 2016-10-10 NOTE — ED Notes (Signed)
Bed: [id number] Expected date: 10/10/16 Expected time: 9:27 AM Means of arrival: Ambulance Comments: fall

## 2016-10-11 DIAGNOSIS — J9611 Chronic respiratory failure with hypoxia: Secondary | ICD-10-CM | POA: Diagnosis not present

## 2016-10-11 DIAGNOSIS — M545 Low back pain: Secondary | ICD-10-CM | POA: Diagnosis not present

## 2016-10-11 DIAGNOSIS — R52 Pain, unspecified: Secondary | ICD-10-CM | POA: Diagnosis not present

## 2016-10-11 DIAGNOSIS — S32029A Unspecified fracture of second lumbar vertebra, initial encounter for closed fracture: Secondary | ICD-10-CM | POA: Diagnosis not present

## 2016-10-11 DIAGNOSIS — N3000 Acute cystitis without hematuria: Secondary | ICD-10-CM | POA: Diagnosis not present

## 2016-10-11 LAB — BASIC METABOLIC PANEL
ANION GAP: 8 (ref 5–15)
BUN: 11 mg/dL (ref 6–20)
CHLORIDE: 96 mmol/L — AB (ref 101–111)
CO2: 33 mmol/L — AB (ref 22–32)
CREATININE: 0.6 mg/dL (ref 0.44–1.00)
Calcium: 8.6 mg/dL — ABNORMAL LOW (ref 8.9–10.3)
GFR calc non Af Amer: >60 mL/min (ref >60)
Glucose, Bld: 162 mg/dL — ABNORMAL HIGH (ref 65–99)
POTASSIUM: 3.7 mmol/L (ref 3.5–5.1)
SODIUM: 137 mmol/L (ref 135–145)

## 2016-10-11 LAB — CBC
HEMATOCRIT: 35.8 % — AB (ref 36.0–46.0)
HEMOGLOBIN: 10.8 g/dL — AB (ref 12.0–15.0)
MCH: 25.8 pg — AB (ref 26.0–34.0)
MCHC: 30.2 g/dL (ref 30.0–36.0)
MCV: 85.6 fL (ref 78.0–100.0)
PLATELETS: 353 10*3/uL (ref 150–400)
RBC: 4.18 MIL/uL (ref 3.87–5.11)
RDW: 14.1 % (ref 11.5–15.5)
WBC: 10.2 10*3/uL (ref 4.0–10.5)

## 2016-10-11 MED ORDER — HYDROCODONE-ACETAMINOPHEN 7.5-325 MG PO TABS
1.0000 | ORAL_TABLET | ORAL | Status: DC | PRN
  Administered 2016-10-11 – 2016-10-12 (×4): 1 via ORAL
  Filled 2016-10-11 (×4): qty 1

## 2016-10-11 MED ORDER — DULOXETINE HCL 60 MG PO CPEP
60.0000 mg | ORAL_CAPSULE | Freq: Every day | ORAL | Status: DC
  Administered 2016-10-12 – 2016-10-17 (×6): 60 mg via ORAL
  Filled 2016-10-11 (×6): qty 1

## 2016-10-11 MED ORDER — METHOCARBAMOL 500 MG PO TABS
500.0000 mg | ORAL_TABLET | Freq: Four times a day (QID) | ORAL | Status: DC
  Administered 2016-10-11 – 2016-10-17 (×23): 500 mg via ORAL
  Filled 2016-10-11 (×23): qty 1

## 2016-10-11 MED ORDER — ENOXAPARIN SODIUM 40 MG/0.4ML ~~LOC~~ SOLN
40.0000 mg | SUBCUTANEOUS | Status: DC
  Administered 2016-10-11 – 2016-10-12 (×2): 40 mg via SUBCUTANEOUS
  Filled 2016-10-11 (×2): qty 0.4

## 2016-10-11 MED ORDER — HYDROCODONE-ACETAMINOPHEN 7.5-325 MG/15ML PO SOLN: 10.0000 mL | ORAL | Status: DC | PRN

## 2016-10-11 NOTE — Progress Notes (Signed)
PROGRESS NOTE    [name]  [id number] DOB: June 28, 1936 DOA: 10/10/2016 PCP: [name], MD    Brief Narrative: 80 y.o. female w/ past medical history of COPD, chronic respiratory failure on 3 L nasal cannula, hypertension, osteoporosis, depression, presented with mechanical fall (tripped on her oxygen tube) and lower back pain.Xray significant for L2 fracture, patient with history of kyphoplasty in the past, she is with no focal deficits.  Assessment & Plan:   # Severe lower back due to L2 vertebral fracture after the fall:  -Continue Norco and morphine when necessary for pain control. -Changed Robaxin to 4 times a day -Bowel regimen -Patient has no sign of cord compression. -IR was consulted for evaluation and possible kyphoplasty. -PT and OT therapy. PT recommended SNF. Social worker consulted for safe discharge planning.  # Essential Hypertension: Monitor blood pressure. Not on medication this time.  # COPD (chronic obstructive pulmonary disease) with chronic hypoxic respiratory failure on home oxygen: Stable. Patient is currently on 2-3 L of oxygen. She has no respiratory symptoms.    #Normocytic anemia: Hemoglobin around baseline.    Continue current medical and supportive care.  DVT prophylaxis: Lovenox subcutaneous Code Status: Full code Family Communication: No family present at bedside Disposition Plan: Likely discharge to SNF versus rehabilitation versus home with PT in 1-2 days.   Consultants:   IR  Procedures: None Antimicrobials: None  Subjective: Patient was seen and examined at bedside. Patient reported back pain after the fall at home. Denied headache, dizziness, fever, chills, nausea, vomiting, chest pain, shortness of breath. Denies bowel or urinary incontinence. Denies weakness, numbness or tingling in lower extremities.   Objective: Vitals:   10/10/16 1615 10/10/16 1930 10/10/16 2235 10/11/16 0654  BP: (!) 147/56  (!) 143/66 (!)  119/47  Pulse: 99  (!) 106 98  Resp:   20 16  Temp: 98.7 F (37.1 C)  97.9 F (36.6 C) 98.4 F (36.9 C)  TempSrc: Oral  Oral Oral  SpO2: 94%  94% 96%  Weight:  62.6 kg (138 lb)    Height:  5' (1.524 m)      Intake/Output Summary (Last 24 hours) at 10/11/16 1317 Last data filed at 10/11/16 0755  Gross per 24 hour  Intake              444 ml  Output              100 ml  Net              344 ml   Filed Weights   10/10/16 1930  Weight: 62.6 kg (138 lb)    Examination:  General exam: Appears calm and comfortable  Respiratory system: Clear to auscultation. Respiratory effort normal. Cardiovascular system: S1 & S2 heard, RRR. No pedal edema. Gastrointestinal system: Abdomen is nondistended, soft and nontender.Normal bowel sounds heard. Central nervous system: Alert and oriented. No focal neurological deficits. Extremities: Symmetric 5 x 5 power. Skin: No rashes, lesions or ulcers Psychiatry: Judgement and insight appear normal. Mood & affect appropriate.     Data Reviewed: I have personally reviewed following labs and imaging studies  CBC:  Recent Labs Lab 10/10/16 1200 10/11/16 0423  WBC 15.5* 10.2  NEUTROABS 14.3*  --   HGB 10.7* 10.8*  HCT 36.7 35.8*  MCV 88.6 85.6  PLT 394 0000000   Basic Metabolic Panel:  Recent Labs Lab 10/10/16 1200 10/11/16 0423  NA 136 137  K 4.1 3.7  CL 96* 96*  CO2 33* 33*  GLUCOSE 166* 162*  BUN 15 11  CREATININE 0.56 0.60  CALCIUM 8.7* 8.6*   GFR: Estimated Creatinine Clearance: 48.6 mL/min (by C-G formula based on SCr of 0.6 mg/dL). Liver Function Tests: No results for input(s): AST, ALT, ALKPHOS, BILITOT, PROT, ALBUMIN in the last 168 hours. No results for input(s): LIPASE, AMYLASE in the last 168 hours. No results for input(s): AMMONIA in the last 168 hours. Coagulation Profile: No results for input(s): INR, PROTIME in the last 168 hours. Cardiac Enzymes: No results for input(s): CKTOTAL, CKMB, CKMBINDEX, TROPONINI in  the last 168 hours. BNP (last 3 results) No results for input(s): PROBNP in the last 8760 hours. HbA1C: No results for input(s): HGBA1C in the last 72 hours. CBG: No results for input(s): GLUCAP in the last 168 hours. Lipid Profile: No results for input(s): CHOL, HDL, LDLCALC, TRIG, CHOLHDL, LDLDIRECT in the last 72 hours. Thyroid Function Tests: No results for input(s): TSH, T4TOTAL, FREET4, T3FREE, THYROIDAB in the last 72 hours. Anemia Panel: No results for input(s): VITAMINB12, FOLATE, FERRITIN, TIBC, IRON, RETICCTPCT in the last 72 hours. Sepsis Labs: No results for input(s): PROCALCITON, LATICACIDVEN in the last 168 hours.  No results found for this or any previous visit (from the past 240 hour(s)).       Radiology Studies: Dg Thoracic Spine 2 View  Result Date: 10/10/2016 CLINICAL DATA:  Tripped over oxygen tubing now with generalized back pain, worse regional to T12. EXAM: THORACIC SPINE 2 VIEWS COMPARISON:  Chest radiograph- 12/19/2015 ; lumbar spine radiographs - earlier same day CT abdomen pelvis - 03/19/2014 FINDINGS: There is a very mild scoliotic curvature of the thoracolumbar spine, presumably degenerative in etiology. No definite anterolisthesis or retrolisthesis. Post cement augmentation of the T12 vertebral body with cement seen within the adjacent lumbar veins, unchanged compared to remote CT the abdomen pelvis performed 03/19/2014. Remaining thoracic and lumbar vertebral body heights appear preserved. Thoracic intervertebral disc space heights appear preserved. Limited visualization adjacent thorax demonstrates atherosclerotic plaque within the thoracic aorta. Regional soft tissues appear normal. IMPRESSION: 1. No acute findings. 2. Post cement augmentation of T12. 3.  Aortic Atherosclerosis (ICD10-170.0) Electronically Signed   By: Sandi Mariscal M.D.   On: 10/10/2016 12:05   Dg Lumbar Spine Complete  Result Date: 10/10/2016 CLINICAL DATA:  Patient tripped over oxygen  tubing now with generalized back pain. EXAM: LUMBAR SPINE - COMPLETE 4+ VIEW COMPARISON:  Thoracic spine radiographs - earlier same day ; CT abdomen pelvis - 03/19/2014 FINDINGS: There are 6 non rib-bearing lumbar type vertebral bodies. For the purposes of this dictation, lumbar levels be labeled L1 through L6. There is minimal (approximately 6 mm) of retrolisthesis of L4 upon L5, unchanged. Otherwise, normal alignment of the lumbar spine. Stable sequela of a T12, L4 and bilateral sacroplasty without interval change. Cement is again seen within draining lumbar veins at both the T12 and L4 vertebral bodies. Interval development of a mild (approximately 25%) compression deformity involving the superior endplate of the L2 vertebral body with approximately 5 mm of retropulsion at this level. Remaining lumbar vertebral body heights appear preserved. Mild to moderate multilevel lumbar spine DDD, worse at L3-L4 with disc space height loss, endplate irregularity and sclerosis. Limited visualization the bilateral SI joints is normal. Multiple phleboliths overlie the lower pelvis bilaterally, left greater than right. Calcified atherosclerotic plaque within the abdominal aorta. Regional bowel gas pattern is normal. IMPRESSION: 1. Transitional anatomy with spinal labeling as above. 2. Interval development of a  presumably acute mild (approximately 25%) compression deformity involving the superior endplate of the L2 vertebral body with minimal (approximately 5 mm) of retropulsion at this level. Correlation for point tenderness at this location is recommended. Further evaluation lumbar spine MRI could be performed as clinically indicated. 3. Stable sequela of cement augmentation of T12, L4 and bilateral sacroplasty. 4. Mild-to-moderate multilevel lumbar spine DDD, worse at L3-L4. Electronically Signed   By: Sandi Mariscal M.D.   On: 10/10/2016 12:10        Scheduled Meds: . docusate sodium  100 mg Oral BID  . [START ON  10/12/2016] DULoxetine  60 mg Oral Daily  . heparin  5,000 Units Subcutaneous Q8H   Continuous Infusions:    LOS: 0 days    Time spent: 25 minutes.    Vayla Wilhelmi Tanna Furry, MD [facility] Pager [phone]  If 7PM-7AM, please contact night-coverage [email or web address] Password [email or web address] 10/11/2016, 1:17 PM

## 2016-10-11 NOTE — Evaluation (Signed)
Physical Therapy Evaluation Patient Details Name: [name] MRN: [id number] DOB: [birth year] Today's Date: 10/11/2016   History of Present Illness  80 y/o female admitted from home s/p fall with c/o back pain.  X-ray report 25% L2 comression fx.  Pt wtih hx of COPD, chronic back pain, osteoporosis, home O2 dependency and, per pt, "several kyphoplasties.  Clinical Impression  Pt s/p fall with L2 compression fx and presents with functional mobility limitations 2* generalized weakness, balance deficits and debilitating back pain with attempts to mobilize.  Dependent on acute care progress and pain control, pt may benefit from follow up rehab at SNF level to maximize IND and safety prior to return home.    Follow Up Recommendations SNF;Home health PT (dependent on pain control and acute stay progress)    Equipment Recommendations  None recommended by PT    Recommendations for Other Services OT consult     Precautions / Restrictions Precautions Precautions: Back;Fall Restrictions Weight Bearing Restrictions: No      Mobility  Bed Mobility Overal bed mobility: Needs Assistance;+ 2 for safety/equipment Bed Mobility: Sit to Supine       Sit to supine: Mod assist;+2 for safety/equipment   General bed mobility comments: cues and mod assist for log roll technique  Transfers Overall transfer level: Needs assistance Equipment used: Rolling walker (2 wheeled) Transfers: Sit to/from Stand Sit to Stand: Min assist;Mod assist;+2 physical assistance;+2 safety/equipment;From elevated surface Stand pivot transfers: Min assist;Mod assist;+2 physical assistance;+2 safety/equipment;From elevated surface       General transfer comment: 2 attempts and increased time with cues for use of UEs to self assist and for basic saftey awareness  Ambulation/Gait             General Gait Details: Pt performed stand/pvt from [facility] to bedside only.  Ltd by report of 10/10 pain with attempts to  mobilize  Financial trader Rankin (Stroke Patients Only)       Balance                                             Pertinent Vitals/Pain Pain Assessment: 0-10 Pain Score: 10-Worst pain ever Pain Location: back pain 10/10 with attempts to mobilize but min pain in supine Pain Descriptors / Indicators: Aching;Sore;Grimacing Pain Intervention(s): Limited activity within patient's tolerance;Monitored during session;Premedicated before session    Home Living Family/patient expects to be discharged to:: Private residence Living Arrangements: Children Available Help at Discharge: Family Type of Home: House Home Access: Stairs to enter Entrance Stairs-Rails: Right Entrance Stairs-Number of Steps: 2 Home Layout: One level Home Equipment: Environmental consultant - 2 wheels;Cane - single point      Prior Function Level of Independence: Needs assistance   Gait / Transfers Assistance Needed: ambulating without device prior to admission.  ADL's / Homemaking Assistance Needed: son performs all homemaking tasks.  Pt states she is IND with ADLs        Hand Dominance        Extremity/Trunk Assessment   Upper Extremity Assessment: Generalized weakness           Lower Extremity Assessment: Generalized weakness      Cervical / Trunk Assessment: Kyphotic  Communication   Communication: No difficulties  Cognition Arousal/Alertness: Awake/alert Behavior During Therapy: WFL for tasks assessed/performed Overall  Cognitive Status: Within Functional Limits for tasks assessed                      General Comments      Exercises     Assessment/Plan    PT Assessment Patient needs continued PT services  PT Problem List Decreased strength;Decreased activity tolerance;Decreased mobility;Decreased balance;Decreased knowledge of use of DME;Pain;Decreased safety awareness          PT Treatment Interventions DME  instruction;Gait training;Stair training;Functional mobility training;Therapeutic activities;Therapeutic exercise;Patient/family education    PT Goals (Current goals can be found in the Care Plan section)  Acute Rehab PT Goals Patient Stated Goal: less pain PT Goal Formulation: With patient Time For Goal Achievement: 10/24/16 Potential to Achieve Goals: Fair    Frequency Min 3X/week   Barriers to discharge        Co-evaluation               End of Session Equipment Utilized During Treatment: Gait belt;Oxygen Activity Tolerance: Patient limited by pain Patient left: in bed;with call bell/phone within reach Nurse Communication: Mobility status    Functional Assessment Tool Used: Clinical jugement Functional Limitation: Mobility: Walking and moving around Mobility: Walking and Moving Around Current Status [id number]): At least 60 percent but less than 80 percent impaired, limited or restricted Mobility: Walking and Moving Around Goal Status [phone]): At least 20 percent but less than 40 percent impaired, limited or restricted    Time: 0815-0837 PT Time Calculation (min) (ACUTE ONLY): 22 min   Charges:   PT Evaluation $PT Eval Low Complexity: 1 Procedure     PT G Codes:   PT G-Codes **NOT FOR INPATIENT CLASS** Functional Assessment Tool Used: Clinical jugement Functional Limitation: Mobility: Walking and moving around Mobility: Walking and Moving Around Current Status [id number]): At least 60 percent but less than 80 percent impaired, limited or restricted Mobility: Walking and Moving Around Goal Status [phone]): At least 20 percent but less than 40 percent impaired, limited or restricted    Zuzanna Maroney 10/11/2016, 1:02 PM

## 2016-10-11 NOTE — Progress Notes (Signed)
Pt was sitting up in bed about to have a snack when [facility] arrived. She asked for a visit later. I learned pt had arrived to the floor shortly before my visit. Please page when additional assistance is needed.[place], M.Div.   10/10/16 1615  Clinical Encounter Type  Visited With Patient  Visit Type Initial  Referral From Nurse  Consult/Referral To Chaplain

## 2016-10-11 NOTE — Care Management Note (Signed)
Case Management Note  Patient Details  Name: [name] MRN: [id number] Date of Birth: 05-10-1936  Subjective/Objective:   Lumbar fx                 Action/Plan: Discharge Planning:  NCM spoke to pt and explained Advanced Beneficiary of Noncoverage. States she did understand notice. She lives at home with son. Offered choice for [facility] and states she had [facility] in the past. Pt is hoping she can have her surgery while she is in the hospital. States she does not want to go to SNF-rehab. Will continue to follow for dc needs.    Expected Discharge Date:                  Expected Discharge Plan:  [place]  In-House Referral:  Clinical Social Work  Discharge planning Services  CM Consult  Post Acute Care Choice:  Home Health Choice offered to:  Patient  DME Arranged:    DME Agency:     HH Arranged:    [place] Agency:  [place]  Status of Service:  In process, will continue to follow  If discussed at Long Length of Stay Meetings, dates discussed:    Additional Comments:  Erenest Rasher, RN 10/11/2016, 7:21 PM

## 2016-10-11 NOTE — Progress Notes (Signed)
Patient having difficulty voiding. States that she feels like her bladder is full, and cannot empty. Bladder scan volume is 800 mL.  MD paged about results, and RN requested a straight cath order. MD gave order to straight cath x1 at this time. Patient straight cathed at 1145. 853ml of urine obtained

## 2016-10-12 ENCOUNTER — Observation Stay (HOSPITAL_COMMUNITY): Payer: Medicare Other

## 2016-10-12 ENCOUNTER — Encounter (HOSPITAL_COMMUNITY): Payer: Self-pay | Admitting: Radiology

## 2016-10-12 DIAGNOSIS — S32029A Unspecified fracture of second lumbar vertebra, initial encounter for closed fracture: Secondary | ICD-10-CM | POA: Diagnosis not present

## 2016-10-12 DIAGNOSIS — R52 Pain, unspecified: Secondary | ICD-10-CM | POA: Diagnosis not present

## 2016-10-12 DIAGNOSIS — J9611 Chronic respiratory failure with hypoxia: Secondary | ICD-10-CM | POA: Diagnosis not present

## 2016-10-12 MED ORDER — DEXAMETHASONE SODIUM PHOSPHATE 4 MG/ML IJ SOLN
4.0000 mg | Freq: Four times a day (QID) | INTRAMUSCULAR | Status: AC
  Administered 2016-10-12 – 2016-10-13 (×3): 4 mg via INTRAVENOUS
  Filled 2016-10-12 (×5): qty 1

## 2016-10-12 MED ORDER — LIP MEDEX EX OINT
TOPICAL_OINTMENT | CUTANEOUS | Status: AC
  Administered 2016-10-12: 16:00:00
  Filled 2016-10-12: qty 7

## 2016-10-12 MED ORDER — OXYCODONE-ACETAMINOPHEN 7.5-325 MG PO TABS
1.0000 | ORAL_TABLET | ORAL | Status: DC | PRN
  Administered 2016-10-12 – 2016-10-16 (×15): 1 via ORAL
  Filled 2016-10-12 (×15): qty 1

## 2016-10-12 MED ORDER — GADOBENATE DIMEGLUMINE 529 MG/ML IV SOLN
13.0000 mL | Freq: Once | INTRAVENOUS | Status: AC | PRN
  Administered 2016-10-12: 13 mL via INTRAVENOUS

## 2016-10-12 NOTE — Progress Notes (Signed)
CSW received consult for SNF placement, note per [facility] & RN, Hinton Dyer - patient is declining SNF placement & plans to return home at discharge.   No further CSW needs identified - CSW signing off.   Raynaldo Opitz, [facility] Clinical Social Worker cell #: [phone]

## 2016-10-12 NOTE — Care Management Note (Signed)
Case Management Note  Patient Details  Name: [name] MRN: [id number] Date of Birth: 08-19-1936  Subjective/Objective:  Patient for MRI, IR-VP/KP. [facility] already following, patient has rw, shower chair, uses [facility]-home  02.PT-recc SNF vs HHC. Await post procedure recc.                  Action/Plan:d/c plan SNF/HHC   Expected Discharge Date:                  Expected Discharge Plan:  [name]  In-House Referral:  Clinical Social Work  Discharge planning Services  CM Consult  Post Acute Care Choice:  Home Health Choice offered to:  Patient  DME Arranged:    DME Agency:     HH Arranged:    [place] Agency:  [place]  Status of Service:  In process, will continue to follow  If discussed at Long Length of Stay Meetings, dates discussed:    Additional Comments:  Dessa Phi, RN 10/12/2016, 12:38 PM

## 2016-10-12 NOTE — Progress Notes (Signed)
Physical Therapy Treatment Patient Details Name: AMERICA CALICA MRN: [id number] DOB: 12/17/1936 Today's Date: 10/12/2016    History of Present Illness 80 y/o female admitted from home s/p fall with c/o back pain.  X-ray report 25% L2 comression fx.  Pt wtih hx of COPD, chronic back pain, osteoporosis, home O2 dependency and, per pt, "several kyphoplasties.    PT Comments    Earlier pt crying in pain.  Morphine given prior to PT session.  With increased time, assisted OOB to [facility] then back to bed only due to pain level.  Very limited activity tolerance.    Follow Up Recommendations  SNF;Home health PT     Equipment Recommendations  None recommended by PT    Recommendations for Other Services       Precautions / Restrictions Precautions Precautions: Back;Fall Restrictions Weight Bearing Restrictions: No    Mobility  Bed Mobility Overal bed mobility: Needs Assistance;+ 2 for safety/equipment Bed Mobility: Supine to Sit;Sit to Supine     Supine to sit: Mod assist;Max assist Sit to supine: Mod assist;Max assist   General bed mobility comments: cues and mod assist for log roll technique and increased time to minimize pain level  Transfers Overall transfer level: Needs assistance Equipment used: None Transfers: Sit to/from Stand;Stand Pivot Transfers Sit to Stand: Min assist;Mod assist Stand pivot transfers: Min assist;Mod assist       General transfer comment: assisted from elevated bed to [facility] then back to bed with 50% VC's on proper hand placement and turn completion  Ambulation/Gait Ambulation/Gait assistance: Min assist Ambulation Distance (Feet): 1 Feet Assistive device: Rolling walker (2 wheeled) Gait Pattern/deviations: Step-to pattern;Decreased step length - right;Decreased step length - left     General Gait Details: pt [facility] with increased time and VC's for direction.  Pain with standing < pain with transfer but still  intense.    Stairs            Wheelchair Mobility    Modified Rankin (Stroke Patients Only)       Balance                                    Cognition Arousal/Alertness: Awake/alert Behavior During Therapy: WFL for tasks assessed/performed Overall Cognitive Status: Within Functional Limits for tasks assessed                      Exercises      General Comments        Pertinent Vitals/Pain Pain Assessment: Faces Faces Pain Scale: Hurts whole lot Pain Location: back and now neck Pain Descriptors / Indicators: Grimacing;Tightness Pain Intervention(s): Monitored during session;Repositioned    Home Living                      Prior Function            PT Goals (current goals can now be found in the care plan section) Progress towards PT goals: Progressing toward goals    Frequency    Min 3X/week      PT Plan Current plan remains appropriate    Co-evaluation             End of Session Equipment Utilized During Treatment: Gait belt Activity Tolerance: Patient limited by pain Patient left: in bed;with call bell/phone within reach;with bed alarm set  Time: [id number] PT Time Calculation (min) (ACUTE ONLY): 24 min  Charges:  $Therapeutic Activity: 23-37 mins                    G Codes:      Rica Koyanagi  PTA [facility]  Acute  Rehab Pager      [phone]

## 2016-10-12 NOTE — Progress Notes (Signed)
PROGRESS NOTE    [name]  [id number] DOB: 03/11/1936 DOA: 10/10/2016 PCP: [name], MD    Brief Narrative: 80 y.o. female w/ past medical history of COPD, chronic respiratory failure on 3 L nasal cannula, hypertension, osteoporosis, depression, presented with mechanical fall (tripped on her oxygen tube) and lower back pain.Xray significant for L2 fracture, patient with history of kyphoplasty in the past, she is with no focal deficits.  Assessment & Plan:   # Severe lower back due to L2 vertebral fracture after the fall, pain management: -pain is not controlled. Changed norco to percocet as per patients' request. Continue scheduled robaxin and as needed IV morphine. Added decadron IV for 4 doses for antiinflammatory property. -Bowel regimen colace and miralax prn. -Patient has no sign of cord compression. -IR consult appreciated, plan for MRI spine and possible kyphoplasty. -PT and OT therapy. PT recommended SNF. Social worker consulted for safe discharge planning. Discussed with SW today.  # Essential Hypertension: Monitor blood pressure. Not on medication this time. Elevated BP readings likely due to pain.  # acute urinary retention on 10/11/16  Required straight cath: bladder scan, mobility.   # COPD (chronic obstructive pulmonary disease) with chronic hypoxic respiratory failure on home oxygen: Stable. Patient is currently on 2-3 L of oxygen. She has no respiratory symptoms.    #Normocytic anemia: Hemoglobin around baseline.    Continue current medical and supportive care.  DVT prophylaxis: Lovenox subcutaneous Code Status: Full code Family Communication: No family present at bedside Disposition Plan: Likely discharge to SNF versus rehabilitation versus home with PT in 1-2 days.   Consultants:   IR  Procedures: None Antimicrobials: None  Subjective: Patient was seen and examined at bedside. Patient with severe back pain and reported weakness.  Reported difficulty moving because of back pain. No headache, dizziness, nausea, vomiting, chest pain, shortness of breath. Reported urinating today.  Objective: Vitals:   10/10/16 2235 10/11/16 0654 10/11/16 1357 10/11/16 2248  BP: (!) 143/66 (!) 119/47 130/72 (!) 144/46  Pulse: (!) 106 98 90 (!) 101  Resp: 20 16 16 16   Temp: 97.9 F (36.6 C) 98.4 F (36.9 C) 98.3 F (36.8 C) 98.3 F (36.8 C)  TempSrc: Oral Oral Oral Oral  SpO2: 94% 96%  93%  Weight:      Height:        Intake/Output Summary (Last 24 hours) at 10/12/16 1246 Last data filed at 10/12/16 0843  Gross per 24 hour  Intake              600 ml  Output              550 ml  Net               50 ml   Filed Weights   10/10/16 1930  Weight: 62.6 kg (138 lb)    Examination:  General exam: Appears distressed because of back pain. Respiratory system: Clear to auscultation, no crackle or wheeze. Cardiovascular system: Regular rate rhythm, S1-S2 normal. Gastrointestinal system: Abdomen soft, nontender, nondistended. Bowel sound positive. Central nervous system: Alert, awake, following commands. No focal deficit.  Extremities: Symmetric 5 x 5 power. Skin: No rashes, lesions or ulcers Psychiatry: Judgement and insight appear normal. Mood & affect appropriate.     Data Reviewed: I have personally reviewed following labs and imaging studies  CBC:  Recent Labs Lab 10/10/16 1200 10/11/16 0423  WBC 15.5* 10.2  NEUTROABS 14.3*  --   HGB 10.7* 10.8*  HCT 36.7 35.8*  MCV 88.6 85.6  PLT 394 0000000   Basic Metabolic Panel:  Recent Labs Lab 10/10/16 1200 10/11/16 0423  NA 136 137  K 4.1 3.7  CL 96* 96*  CO2 33* 33*  GLUCOSE 166* 162*  BUN 15 11  CREATININE 0.56 0.60  CALCIUM 8.7* 8.6*   GFR: Estimated Creatinine Clearance: 48.6 mL/min (by C-G formula based on SCr of 0.6 mg/dL). Liver Function Tests: No results for input(s): AST, ALT, ALKPHOS, BILITOT, PROT, ALBUMIN in the last 168 hours. No results for  input(s): LIPASE, AMYLASE in the last 168 hours. No results for input(s): AMMONIA in the last 168 hours. Coagulation Profile: No results for input(s): INR, PROTIME in the last 168 hours. Cardiac Enzymes: No results for input(s): CKTOTAL, CKMB, CKMBINDEX, TROPONINI in the last 168 hours. BNP (last 3 results) No results for input(s): PROBNP in the last 8760 hours. HbA1C: No results for input(s): HGBA1C in the last 72 hours. CBG: No results for input(s): GLUCAP in the last 168 hours. Lipid Profile: No results for input(s): CHOL, HDL, LDLCALC, TRIG, CHOLHDL, LDLDIRECT in the last 72 hours. Thyroid Function Tests: No results for input(s): TSH, T4TOTAL, FREET4, T3FREE, THYROIDAB in the last 72 hours. Anemia Panel: No results for input(s): VITAMINB12, FOLATE, FERRITIN, TIBC, IRON, RETICCTPCT in the last 72 hours. Sepsis Labs: No results for input(s): PROCALCITON, LATICACIDVEN in the last 168 hours.  No results found for this or any previous visit (from the past 240 hour(s)).       Radiology Studies: No results found.      Scheduled Meds: . dexamethasone  4 mg Intravenous Q6H  . docusate sodium  100 mg Oral BID  . DULoxetine  60 mg Oral Daily  . enoxaparin (LOVENOX) injection  40 mg Subcutaneous Q24H  . methocarbamol  500 mg Oral QID   Continuous Infusions:    LOS: 0 days    Time spent: 26 minutes.    Dron Tanna Furry, MD [facility] Pager [phone]  If 7PM-7AM, please contact night-coverage [email or web address] Password [email or web address] 10/12/2016, 12:46 PM

## 2016-10-12 NOTE — Care Management Note (Signed)
Case Management Note  Patient Details  Name: [name] MRN: [id number] Date of Birth: 1936-12-28  Subjective/Objective:  Per [facility] pcp do not sign their [place] orders. IR-do not sign post d/c HHC orders.Will check w/other [facility] agencies.                  Action/Plan:d/c home w/HHC.   Expected Discharge Date:                  Expected Discharge Plan:  [place]  In-House Referral:  Clinical Social Work  Discharge planning Services  CM Consult  Post Acute Care Choice:  Home Health Choice offered to:  Patient  DME Arranged:    DME Agency:     HH Arranged:    [place] Agency:     Status of Service:  In process, will continue to follow  If discussed at Long Length of Stay Meetings, dates discussed:    Additional Comments:  Dessa Phi, RN 10/12/2016, 12:55 PM

## 2016-10-12 NOTE — Progress Notes (Signed)
Patient ID: [name], female   DOB: 11/07/1936, 80 y.o.   MRN: [id number] Aware of request for VP/KP on pt. Hx noted; she has had prior T1,L3 VP and sacroplasty . Will need MRI of lumbar spine to properly assess for additional VP/KP- study ordered today. Will continue to monitor.

## 2016-10-12 NOTE — Care Management Note (Signed)
Case Management Note  Patient Details  Name: [name] MRN: [id number] Date of Birth: Sep 09, 1936  Subjective/Objective:[facility] will be able to accept patient-Ptient agrees to [place] aware & following for [facility] orders.RN/HHPT,[id number]  Order, [facility] already provides home 02. Reminded patient of Advanced Beneficiary Notice, & also IR-Kevin aware.                    Action/Plan:d/c plan home w/HHC.   Expected Discharge Date:                  Expected Discharge Plan:  [place]  In-House Referral:  Clinical Social Work  Discharge planning Services  CM Consult  Post Acute Care Choice:  Home Health Choice offered to:  Patient  DME Arranged:    DME Agency:     HH Arranged:    [place] Agency:  [facility]  Status of Service:  In process, will continue to follow  If discussed at Long Length of Stay Meetings, dates discussed:    Additional Comments:  Dessa Phi, RN 10/12/2016, 2:32 PM

## 2016-10-12 NOTE — Progress Notes (Signed)
Physical Therapy Treatment Patient Details Name: [name] MRN: [id number] DOB: 1936-02-16 Today's Date: 10/12/2016    History of Present Illness 80 y/o female admitted from home s/p fall with c/o back pain.  X-ray report 25% L2 comression fx.  Pt wtih hx of COPD, chronic back pain, osteoporosis, home O2 dependency and, per pt, "several kyphoplasties.    PT Comments    Assisted pt OOB to [facility] then back to bed.  Increased pain level greater this session.  Still very limited tolerance and inability to functionally amb due to pain.  Pt awaiting MRI.    Follow Up Recommendations  SNF;Home health PT     Equipment Recommendations  None recommended by PT    Recommendations for Other Services       Precautions / Restrictions Precautions Precautions: Back;Fall Restrictions Weight Bearing Restrictions: No    Mobility  Bed Mobility Overal bed mobility: Needs Assistance;+ 2 for safety/equipment Bed Mobility: Supine to Sit;Sit to Supine     Supine to sit: Mod assist;Max assist Sit to supine: Mod assist;Max assist   General bed mobility comments: cues and mod assist for log roll technique and increased time to minimize pain level  Transfers Overall transfer level: Needs assistance Equipment used: None Transfers: Sit to/from Stand;Stand Pivot Transfers Sit to Stand: Min assist;Mod assist Stand pivot transfers: Min assist;Mod assist       General transfer comment: assisted from elevated bed to [facility] then back to bed with 50% VC's on proper hand placement and turn completion  Ambulation/Gait Ambulation/Gait assistance: Min assist Ambulation Distance (Feet): 1 Feet Assistive device: Rolling walker (2 wheeled) Gait Pattern/deviations: Step-to pattern;Decreased step length - right;Decreased step length - left     General Gait Details: pt was able to take several steps twoards [facility] with increased time and VC's for direction.  Pain with standing < pain with transfer but still  intense.    Stairs            Wheelchair Mobility    Modified Rankin (Stroke Patients Only)       Balance                                    Cognition Arousal/Alertness: Awake/alert Behavior During Therapy: WFL for tasks assessed/performed Overall Cognitive Status: Within Functional Limits for tasks assessed                      Exercises      General Comments        Pertinent Vitals/Pain Pain Assessment: Faces Faces Pain Scale: Hurts whole lot Pain Location: back and now neck Pain Descriptors / Indicators: Grimacing;Tightness Pain Intervention(s): Monitored during session;Repositioned    Home Living                      Prior Function            PT Goals (current goals can now be found in the care plan section) Progress towards PT goals: Progressing toward goals    Frequency    Min 3X/week      PT Plan Current plan remains appropriate    Co-evaluation             End of Session Equipment Utilized During Treatment: Gait belt Activity Tolerance: Patient limited by pain Patient left: in bed;with call bell/phone within reach;with bed alarm set  Time: [id number] PT Time Calculation (min) (ACUTE ONLY): 17 min  Charges:  $Therapeutic Activity: 8-22 mins                    G Codes:      Rica Koyanagi  PTA [facility]  Acute  Rehab Pager      [phone]

## 2016-10-12 NOTE — Care Management Obs Status (Signed)
Effort NOTIFICATION   Patient Details  Name: SIMRYN KRISTY MRN: [id number] Date of Birth: Dec 10, 1936   Medicare Observation Status Notification Given:  Yes    MahabirJuliann Pulse, RN 10/12/2016, 12:35 PM

## 2016-10-12 NOTE — Progress Notes (Signed)
OT Cancellation Note  Patient Details Name: [name] MRN: [id number] DOB: March 05, 1936   Cancelled Treatment:    Reason Eval/Treat Not Completed: Pain limiting ability to participate -- pt reports 10/10 pain, also states she received "stronger pain medication" and is very groggy. Will attempt OT evaluation tomorrow.  Trino Higinbotham A 10/12/2016, 11:30 AM

## 2016-10-13 DIAGNOSIS — Y92003 Bedroom of unspecified non-institutional (private) residence as the place of occurrence of the external cause: Secondary | ICD-10-CM | POA: Diagnosis not present

## 2016-10-13 DIAGNOSIS — D649 Anemia, unspecified: Secondary | ICD-10-CM | POA: Diagnosis present

## 2016-10-13 DIAGNOSIS — R339 Retention of urine, unspecified: Secondary | ICD-10-CM | POA: Diagnosis present

## 2016-10-13 DIAGNOSIS — Z87891 Personal history of nicotine dependence: Secondary | ICD-10-CM | POA: Diagnosis not present

## 2016-10-13 DIAGNOSIS — N3000 Acute cystitis without hematuria: Secondary | ICD-10-CM | POA: Diagnosis not present

## 2016-10-13 DIAGNOSIS — Z9981 Dependence on supplemental oxygen: Secondary | ICD-10-CM | POA: Diagnosis not present

## 2016-10-13 DIAGNOSIS — R52 Pain, unspecified: Secondary | ICD-10-CM | POA: Diagnosis not present

## 2016-10-13 DIAGNOSIS — M545 Low back pain: Secondary | ICD-10-CM | POA: Diagnosis present

## 2016-10-13 DIAGNOSIS — M48061 Spinal stenosis, lumbar region without neurogenic claudication: Secondary | ICD-10-CM | POA: Diagnosis present

## 2016-10-13 DIAGNOSIS — Z23 Encounter for immunization: Secondary | ICD-10-CM | POA: Diagnosis not present

## 2016-10-13 DIAGNOSIS — S32029A Unspecified fracture of second lumbar vertebra, initial encounter for closed fracture: Secondary | ICD-10-CM | POA: Diagnosis not present

## 2016-10-13 DIAGNOSIS — J449 Chronic obstructive pulmonary disease, unspecified: Secondary | ICD-10-CM | POA: Diagnosis not present

## 2016-10-13 DIAGNOSIS — S32011A Stable burst fracture of first lumbar vertebra, initial encounter for closed fracture: Secondary | ICD-10-CM | POA: Diagnosis present

## 2016-10-13 DIAGNOSIS — M810 Age-related osteoporosis without current pathological fracture: Secondary | ICD-10-CM | POA: Diagnosis present

## 2016-10-13 DIAGNOSIS — W010XXA Fall on same level from slipping, tripping and stumbling without subsequent striking against object, initial encounter: Secondary | ICD-10-CM | POA: Diagnosis present

## 2016-10-13 DIAGNOSIS — S32010A Wedge compression fracture of first lumbar vertebra, initial encounter for closed fracture: Secondary | ICD-10-CM | POA: Diagnosis not present

## 2016-10-13 DIAGNOSIS — F329 Major depressive disorder, single episode, unspecified: Secondary | ICD-10-CM | POA: Diagnosis present

## 2016-10-13 DIAGNOSIS — Z8249 Family history of ischemic heart disease and other diseases of the circulatory system: Secondary | ICD-10-CM | POA: Diagnosis not present

## 2016-10-13 DIAGNOSIS — I10 Essential (primary) hypertension: Secondary | ICD-10-CM | POA: Diagnosis present

## 2016-10-13 DIAGNOSIS — J9611 Chronic respiratory failure with hypoxia: Secondary | ICD-10-CM | POA: Diagnosis not present

## 2016-10-13 LAB — URINALYSIS, ROUTINE W REFLEX MICROSCOPIC
Bilirubin Urine: NEGATIVE
Glucose, UA: NEGATIVE mg/dL
KETONES UR: NEGATIVE mg/dL
NITRITE: POSITIVE — AB
PROTEIN: NEGATIVE mg/dL
Specific Gravity, Urine: 1.017 (ref 1.005–1.030)
pH: 5.5 (ref 5.0–8.0)

## 2016-10-13 LAB — URINE MICROSCOPIC-ADD ON

## 2016-10-13 MED ORDER — CEFTRIAXONE SODIUM 1 G IJ SOLR
1.0000 g | INTRAMUSCULAR | Status: DC
  Administered 2016-10-13 – 2016-10-14 (×2): 1 g via INTRAVENOUS
  Filled 2016-10-13 (×4): qty 10

## 2016-10-13 MED ORDER — CEFAZOLIN SODIUM-DEXTROSE 2-4 GM/100ML-% IV SOLN
2.0000 g | INTRAVENOUS | Status: AC
  Administered 2016-10-15: 2 g via INTRAVENOUS
  Filled 2016-10-13 (×2): qty 100

## 2016-10-13 MED ORDER — DEXAMETHASONE SODIUM PHOSPHATE 4 MG/ML IJ SOLN
4.0000 mg | Freq: Four times a day (QID) | INTRAMUSCULAR | Status: AC
  Administered 2016-10-13 – 2016-10-14 (×5): 4 mg via INTRAVENOUS
  Filled 2016-10-13 (×5): qty 1

## 2016-10-13 NOTE — Progress Notes (Signed)
PROGRESS NOTE    [name]  [id number] DOB: 11-09-1936 DOA: 10/10/2016 PCP: [name], MD    Brief Narrative: 80 y.o. female w/ past medical history of COPD, chronic respiratory failure on 3 L nasal cannula, hypertension, osteoporosis, depression, presented with mechanical fall (tripped on her oxygen tube) and lower back pain.Xray significant for L2 fracture, patient with history of kyphoplasty in the past, she is with no focal deficits.  Assessment & Plan:   # Severe lower back due to L2 vertebral fracture after the fall, pain management: -Pain is not adequately controlled with Robaxin, hydrocodone and morphine as needed. Added IV Decadron yesterday for uncontrolled pain and anti-inflammatory property. MRI of spine reviewed. Concern of possible epidural fluid likely blurred therefore Lovenox discontinued. I discussed with interventional radiology, planning for kyphoplasty tomorrow. Continue bowel regimen and pain management. -Patient has difficulty getting out of bed because of severe back pain. I hope the kyphoplasty with current medication will help better control of pain. Continue PT, OT therapy. Patient has no bowel or urinary incontinence. She has no weakness, numbness or tingling in lower extremities. -IR and Education officer, museum consult appreciated. I discussed with the both regarding further plan. Social worker evaluation ongoing for safe discharge to SNF at the end of this hospitalization.  # Essential Hypertension: Monitor blood pressure. Not on medication this time.  # Acute cystitis without hematuria, has acute urinary retention required a straight cath: Patient reported dysuria and urgency. We will start IV ceftriaxone pending culture result. Discussed with the pharmacist.  # COPD (chronic obstructive pulmonary disease) with chronic hypoxic respiratory failure on home oxygen: Stable. Patient is currently on 2-3 L of oxygen. She has no respiratory symptoms.      #Normocytic anemia: Hemoglobin around baseline.    Continue current medical and supportive care.  DVT prophylaxis: SCD and early ambulation. Holding anticoagulation because of MRI finding. Code Status: Full code Family Communication: No family present at bedside Disposition Plan: Likely discharge to SNF versus rehabilitation in 1-2 days.   Consultants:   IR  Procedures: None Antimicrobials: Ceftriaxone started on October 17.   Subjective: Patient was seen and examined at bedside. Patient reported that back pain is somewhat better than yesterday after starting Decadron and with pain medication. Very difficult getting out of bed because of severe back pain. Reported dysuria and urgency. Denied incontinence of bowel or urine. No fever, chills, nausea, vomiting, chest pain or shortness of breath. Objective: Vitals:   10/11/16 2248 10/12/16 1323 10/12/16 2214 10/13/16 0600  BP: (!) 144/46 (!) 142/65 (!) 158/68 130/72  Pulse: (!) 101 (!) 102 (!) 107 (!) 101  Resp: 16 16 16 15   Temp: 98.3 F (36.8 C) 97.5 F (36.4 C) 97.5 F (36.4 C) 97.6 F (36.4 C)  TempSrc: Oral Oral Oral Oral  SpO2: 93% 95% 93% 97%  Weight:      Height:        Intake/Output Summary (Last 24 hours) at 10/13/16 1229 Last data filed at 10/13/16 0913  Gross per 24 hour  Intake              700 ml  Output             1350 ml  Net             -650 ml   Filed Weights   10/10/16 1930  Weight: 62.6 kg (138 lb)    Examination:  General exam: Not on distress today. Complaining of back pain and  difficulty getting out of bed. Respiratory system: Clear to auscultation bilateral, no crackle or wheeze. Cardiovascular system: RRR, s1s2 nl, no murmur appreciated. Gastrointestinal system: Abdomen soft, non-tender, non-distended. BS+ Central nervous system: alert, awake, non-focal deficit.  Extremities: Symmetric 5 x 5 power. Skin: No rashes, lesions or ulcers Psychiatry: Judgement and insight appear normal. Mood &  affect appropriate.     Data Reviewed: I have personally reviewed following labs and imaging studies  CBC:  Recent Labs Lab 10/10/16 1200 10/11/16 0423  WBC 15.5* 10.2  NEUTROABS 14.3*  --   HGB 10.7* 10.8*  HCT 36.7 35.8*  MCV 88.6 85.6  PLT 394 0000000   Basic Metabolic Panel:  Recent Labs Lab 10/10/16 1200 10/11/16 0423  NA 136 137  K 4.1 3.7  CL 96* 96*  CO2 33* 33*  GLUCOSE 166* 162*  BUN 15 11  CREATININE 0.56 0.60  CALCIUM 8.7* 8.6*   GFR: Estimated Creatinine Clearance: 48.6 mL/min (by C-G formula based on SCr of 0.6 mg/dL). Liver Function Tests: No results for input(s): AST, ALT, ALKPHOS, BILITOT, PROT, ALBUMIN in the last 168 hours. No results for input(s): LIPASE, AMYLASE in the last 168 hours. No results for input(s): AMMONIA in the last 168 hours. Coagulation Profile: No results for input(s): INR, PROTIME in the last 168 hours. Cardiac Enzymes: No results for input(s): CKTOTAL, CKMB, CKMBINDEX, TROPONINI in the last 168 hours. BNP (last 3 results) No results for input(s): PROBNP in the last 8760 hours. HbA1C: No results for input(s): HGBA1C in the last 72 hours. CBG: No results for input(s): GLUCAP in the last 168 hours. Lipid Profile: No results for input(s): CHOL, HDL, LDLCALC, TRIG, CHOLHDL, LDLDIRECT in the last 72 hours. Thyroid Function Tests: No results for input(s): TSH, T4TOTAL, FREET4, T3FREE, THYROIDAB in the last 72 hours. Anemia Panel: No results for input(s): VITAMINB12, FOLATE, FERRITIN, TIBC, IRON, RETICCTPCT in the last 72 hours. Sepsis Labs: No results for input(s): PROCALCITON, LATICACIDVEN in the last 168 hours.  No results found for this or any previous visit (from the past 240 hour(s)).       Radiology Studies: Mr Lumbar Spine W Wo Contrast  Result Date: 10/12/2016 CLINICAL DATA:  Back pain. Recent fall. History of osteoporosis and fractures. EXAM: MRI LUMBAR SPINE WITHOUT AND WITH CONTRAST TECHNIQUE: Multiplanar and  multiecho pulse sequences of the lumbar spine were obtained without and with intravenous contrast. CONTRAST:  38mL MULTIHANCE GADOBENATE DIMEGLUMINE 529 MG/ML IV SOLN COMPARISON:  Lumbar MRI 05/04/2013.  Lumbar radiographs 10/10/2016 FINDINGS: Segmentation:  Normal Alignment: Mild anterior slip L2-3. Mild retrolisthesis L3-4. This is unchanged from prior studies. Vertebrae: Chronic compression fracture T11 with cement augmentation unchanged Moderate compression fracture of L1 appears acute or subacute. This appears to be a burst fracture with fracture the posterior cortex of the vertebral body. There is diffuse bone marrow edema and enhancement in the bone marrow. There is fluid within the vertebral body. There is a small amount of ventral epidural fluid bilaterally. There is retropulsion of bone into the canal with mild spinal stenosis. This appears to be a benign fracture. Chronic compression fracture L3 with cement vertebral augmentation unchanged from the prior MRI. Conus medullaris: Extends to the T12-L1 level and appears normal. Paraspinal and other soft tissues: Paraspinous muscles intact. No retroperitoneal mass. Disc levels: T12-L1: Mild disc degeneration. Mild spinal stenosis due to retropulsion of L1 into the canal. L1-2:  Negative L2-3: Mild spinal stenosis due to endplate spurring. Mild facet degeneration L3-4:  Disc bulging  and spurring without significant spinal stenosis L4-5: Mild disc degeneration. Moderate facet hypertrophy causing mild spinal stenosis. L5-S1:  Mild facet degeneration without stenosis. IMPRESSION: Acute or subacute burst fracture of L1 causing mild spinal stenosis due to retropulsion of bone into the canal. In addition, ventral epidural fluid, likely blood, it is present contributing to spinal stenosis. Chronic compression fractures T11 and L3 Mild spinal stenosis L4-5. Electronically Signed   By: Franchot Gallo M.D.   On: 10/12/2016 17:37        Scheduled Meds: .  cefTRIAXone (ROCEPHIN)  IV  1 g Intravenous Q24H  . dexamethasone  4 mg Intravenous Q6H  . docusate sodium  100 mg Oral BID  . DULoxetine  60 mg Oral Daily  . methocarbamol  500 mg Oral QID   Continuous Infusions:    LOS: 0 days    Time spent: 25 minutes.    Jaecob Lowden Tanna Furry, MD [facility] Pager [phone]  If 7PM-7AM, please contact night-coverage [email or web address] Password [email or web address] 10/13/2016, 12:29 PM

## 2016-10-13 NOTE — Progress Notes (Signed)
Occupational Therapy Evaluation Patient Details Name: TRESE JAEGERS MRN: [id number] DOB: [birth year] Today's Date: 10/13/2016    History of Present Illness 80 y/o female admitted from home s/p fall with c/o back pain.  X-ray report 25% L2 comression fx.  Pt wtih hx of COPD, chronic back pain, osteoporosis, home O2 dependency and, per pt, "several kyphoplasties.   Clinical Impression   Limited evaluation performed due to patient's pain; continue OT per plan of care. Plan is for pt to have procedure tomorrow per RN.    Follow Up Recommendations  Supervision/Assistance - 24 hour;Home health OT    Equipment Recommendations  None recommended by OT    Recommendations for Other Services       Precautions / Restrictions Precautions Precautions: Back;Fall Restrictions Weight Bearing Restrictions: No      Mobility Bed Mobility                  Transfers                      Balance                                            ADL Overall ADL's : Needs assistance/impaired Eating/Feeding: Set up;Bed level   Grooming: Set up;Bed level       Lower Body Bathing: Total assistance;Bed level       Lower Body Dressing: Total assistance;Bed level                 General ADL Comments: Patient had just toileted with nursing staff. Needs assistance with bed mobility, BSC transfer and toilet hygiene at this time. Plan is for procedure tomorrow. OT will follow.     Vision     Perception     Praxis      Pertinent Vitals/Pain Pain Assessment: 0-10 Pain Score: 8  Pain Location: back Pain Descriptors / Indicators: Aching;Constant;Sore Pain Intervention(s): Limited activity within patient's tolerance;Monitored during session     Hand Dominance     Extremity/Trunk Assessment Upper Extremity Assessment Upper Extremity Assessment: Generalized weakness   Lower Extremity Assessment Lower Extremity Assessment: Defer to PT  evaluation       Communication Communication Communication: No difficulties   Cognition Arousal/Alertness: Awake/alert Behavior During Therapy: WFL for tasks assessed/performed Overall Cognitive Status: Within Functional Limits for tasks assessed                     General Comments       Exercises       Shoulder Instructions      Home Living Family/patient expects to be discharged to:: Private residence Living Arrangements: Children Available Help at Discharge: Family;Available PRN/intermittently Type of Home: House Home Access: Stairs to enter [employer] of Steps: 2 Entrance Stairs-Rails: Right Home Layout: One level     Bathroom Shower/Tub: Teacher,  years/pre: Standard     Home Equipment: Environmental consultant - 2 wheels;Cane - single point;Shower seat;Bedside commode          Prior Functioning/Environment Level of Independence: Needs assistance  Gait / Transfers Assistance Needed: ambulating without device prior to admission. ADL's / Homemaking Assistance Needed: son performs all homemaking tasks.  Pt states she is IND with ADLs            OT Problem List: Decreased activity tolerance;Decreased strength;Impaired  balance (sitting and/or standing);Decreased safety awareness;Decreased knowledge of use of DME or AE;Decreased knowledge of precautions;Pain   OT Treatment/Interventions: Self-care/ADL training;DME and/or AE instruction;Therapeutic activities;Patient/family education    OT Goals(Current goals can be found in the care plan section) Acute Rehab OT Goals Patient Stated Goal: less pain OT Goal Formulation: With patient Time For Goal Achievement: 10/27/16 Potential to Achieve Goals: Good ADL Goals Pt Will Perform Upper Body Bathing: with set-up;sitting Pt Will Perform Lower Body Bathing: with min assist;sit to/from stand Pt Will Perform Upper Body Dressing: with set-up;sitting Pt Will Perform Lower Body Dressing: with min  assist;sit to/from stand Pt Will Transfer to Toilet: with supervision;ambulating;bedside commode Pt Will Perform Toileting - Clothing Manipulation and hygiene: with supervision;sit to/from stand  OT Frequency: Min 2X/week   Barriers to D/C:            Co-evaluation              End of Session    Activity Tolerance: Patient limited by pain Patient left: in bed;with call bell/phone within reach;with bed alarm set   Time: 1206-1219 OT Time Calculation (min): 13 min Charges:  OT General Charges $OT Visit: 1 Procedure OT Evaluation $OT Eval Low Complexity: 1 Procedure G-Codes: OT G-codes **NOT FOR INPATIENT CLASS** Functional Assessment Tool Used: clinical judgment Functional Limitation: Self care Self Care Current Status [id number]): At least 60 percent but less than 80 percent impaired, limited or restricted Self Care Goal Status [id number]): At least 20 percent but less than 40 percent impaired, limited or restricted  Mariellen Blaney A 10/13/2016, 1:33 PM

## 2016-10-13 NOTE — Progress Notes (Signed)
Referring Physician(s): Elgergawy,D  Supervising Physician: Luanne Bras  Patient Status:  [name] - In-pt  Chief Complaint:  Back pain, L1 compression fracture  Subjective: Patient familiar to [facility] service from prior L3 kyphoplasty in February 2006 with additional vertebroplasty at same level in April 2006,  T11 vertebroplasty in 2009, and S1 sacroplasty in 2014. She has a known past medical history significant for COPD with chronic respiratory failure on 3 L nasal cannula, hypertension, osteoporosis, and depression who presented to the [place] ED on 10/14 after fall at home with subsequent lower back pain. MRI lumbar spine performed on 10/16 revealed acute/subacute burst fracture of L1 causing mild spinal stenosis due to retropulsion of bone into the canal with chronic compression fractures at T11 and L3 and mild spinal stenosis at L4-5. Request now received for L1 vertebroplasty/kyphoplasty. Additional medical history as listed below. She currently denies fever, headache, chest pain, nausea, vomiting or abnormal bleeding. She currently rates her back pain as an 8 out of 10. Past Medical History:  Diagnosis Date  . Chronic back pain   . Colitis 03/19/2014  . COPD (chronic obstructive pulmonary disease) ([place])   . Hypertension   . On home oxygen therapy 06-14-13   continuos 2.5l/m nasally-24/7  . Osteoporosis   . Oxygen dependent    Past Surgical History:  Procedure Laterality Date  . ABDOMINAL HYSTERECTOMY    . ESOPHAGOGASTRODUODENOSCOPY N/A 06/15/2013   Procedure: ESOPHAGOGASTRODUODENOSCOPY (EGD);  Surgeon: [name], MD;  Location: [facility];  Service: Endoscopy;  Laterality: N/A;  . sacroplasty  06-14-13   05-10-13 IVR CONE for fracture stabilization      Allergies: Ambien [zolpidem]  Medications: Prior to Admission medications   Medication Sig Start Date End Date Taking? Authorizing Provider  ALPRAZolam (XANAX) 0.25 MG tablet Take 1 tablet (0.25 mg  total) by mouth 2 (two) times daily as needed for anxiety. Patient not taking: Reported on 10/10/2016 12/26/15   Venetia Maxon Rama, MD     Vital Signs: BP 130/72 (BP Location: Right Arm)   Pulse (!) 101   Temp 97.6 F (36.4 C) (Oral)   Resp 15   Ht 5' (1.524 m)   Wt 138 lb (62.6 kg)   SpO2 97%   BMI 26.95 kg/m   Physical Exam pt awake, alert. Chest with distant breath sounds bilaterally. Heart with slightly tachycardic but regular rhythm; abdomen soft, last bowel sounds, nontender. Moderate to severe paravertebral tenderness L1 region; moving all fours okay; no significant lower extremity edema  Imaging: Dg Thoracic Spine 2 View  Result Date: 10/10/2016 CLINICAL DATA:  Tripped over oxygen tubing now with generalized back pain, worse regional to T12. EXAM: THORACIC SPINE 2 VIEWS COMPARISON:  Chest radiograph- 12/19/2015 ; lumbar spine radiographs - earlier same day CT abdomen pelvis - 03/19/2014 FINDINGS: There is a very mild scoliotic curvature of the thoracolumbar spine, presumably degenerative in etiology. No definite anterolisthesis or retrolisthesis. Post cement augmentation of the T12 vertebral body with cement seen within the adjacent lumbar veins, unchanged compared to remote CT the abdomen pelvis performed 03/19/2014. Remaining thoracic and lumbar vertebral body heights appear preserved. Thoracic intervertebral disc space heights appear preserved. Limited visualization adjacent thorax demonstrates atherosclerotic plaque within the thoracic aorta. Regional soft tissues appear normal. IMPRESSION: 1. No acute findings. 2. Post cement augmentation of T12. 3.  Aortic Atherosclerosis (ICD10-170.0) Electronically Signed   By: Sandi Mariscal M.D.   On: 10/10/2016 12:05   Dg Lumbar Spine Complete  Result Date:  10/10/2016 CLINICAL DATA:  Patient tripped over oxygen tubing now with generalized back pain. EXAM: LUMBAR SPINE - COMPLETE 4+ VIEW COMPARISON:  Thoracic spine radiographs - earlier same  day ; CT abdomen pelvis - 03/19/2014 FINDINGS: There are 6 non rib-bearing lumbar type vertebral bodies. For the purposes of this dictation, lumbar levels be labeled L1 through L6. There is minimal (approximately 6 mm) of retrolisthesis of L4 upon L5, unchanged. Otherwise, normal alignment of the lumbar spine. Stable sequela of a T12, L4 and bilateral sacroplasty without interval change. Cement is again seen within draining lumbar veins at both the T12 and L4 vertebral bodies. Interval development of a mild (approximately 25%) compression deformity involving the superior endplate of the L2 vertebral body with approximately 5 mm of retropulsion at this level. Remaining lumbar vertebral body heights appear preserved. Mild to moderate multilevel lumbar spine DDD, worse at L3-L4 with disc space height loss, endplate irregularity and sclerosis. Limited visualization the bilateral SI joints is normal. Multiple phleboliths overlie the lower pelvis bilaterally, left greater than right. Calcified atherosclerotic plaque within the abdominal aorta. Regional bowel gas pattern is normal. IMPRESSION: 1. Transitional anatomy with spinal labeling as above. 2. Interval development of a presumably acute mild (approximately 25%) compression deformity involving the superior endplate of the L2 vertebral body with minimal (approximately 5 mm) of retropulsion at this level. Correlation for point tenderness at this location is recommended. Further evaluation lumbar spine MRI could be performed as clinically indicated. 3. Stable sequela of cement augmentation of T12, L4 and bilateral sacroplasty. 4. Mild-to-moderate multilevel lumbar spine DDD, worse at L3-L4. Electronically Signed   By: Sandi Mariscal M.D.   On: 10/10/2016 12:10   Mr Lumbar Spine W Wo Contrast  Result Date: 10/12/2016 CLINICAL DATA:  Back pain. Recent fall. History of osteoporosis and fractures. EXAM: MRI LUMBAR SPINE WITHOUT AND WITH CONTRAST TECHNIQUE: Multiplanar and  multiecho pulse sequences of the lumbar spine were obtained without and with intravenous contrast. CONTRAST:  15mL MULTIHANCE GADOBENATE DIMEGLUMINE 529 MG/ML IV SOLN COMPARISON:  Lumbar MRI 05/04/2013.  Lumbar radiographs 10/10/2016 FINDINGS: Segmentation:  Normal Alignment: Mild anterior slip L2-3. Mild retrolisthesis L3-4. This is unchanged from prior studies. Vertebrae: Chronic compression fracture T11 with cement augmentation unchanged Moderate compression fracture of L1 appears acute or subacute. This appears to be a burst fracture with fracture the posterior cortex of the vertebral body. There is diffuse bone marrow edema and enhancement in the bone marrow. There is fluid within the vertebral body. There is a small amount of ventral epidural fluid bilaterally. There is retropulsion of bone into the canal with mild spinal stenosis. This appears to be a benign fracture. Chronic compression fracture L3 with cement vertebral augmentation unchanged from the prior MRI. Conus medullaris: Extends to the T12-L1 level and appears normal. Paraspinal and other soft tissues: Paraspinous muscles intact. No retroperitoneal mass. Disc levels: T12-L1: Mild disc degeneration. Mild spinal stenosis due to retropulsion of L1 into the canal. L1-2:  Negative L2-3: Mild spinal stenosis due to endplate spurring. Mild facet degeneration L3-4:  Disc bulging and spurring without significant spinal stenosis L4-5: Mild disc degeneration. Moderate facet hypertrophy causing mild spinal stenosis. L5-S1:  Mild facet degeneration without stenosis. IMPRESSION: Acute or subacute burst fracture of L1 causing mild spinal stenosis due to retropulsion of bone into the canal. In addition, ventral epidural fluid, likely blood, it is present contributing to spinal stenosis. Chronic compression fractures T11 and L3 Mild spinal stenosis L4-5. Electronically Signed   By: Juanda Crumble  Carlis Abbott M.D.   On: 10/12/2016 17:37    Labs:  CBC:  Recent Labs   12/23/15 0541 12/24/15 0521 10/10/16 1200 10/11/16 0423  WBC 15.1* 14.6* 15.5* 10.2  HGB 9.5* 10.5* 10.7* 10.8*  HCT 31.2* 34.5* 36.7 35.8*  PLT 463* 520* 394 353    COAGS:  Recent Labs  12/19/15 1201  INR 1.20  APTT 30    BMP:  Recent Labs  12/20/15 0810 12/22/15 0509 10/10/16 1200 10/11/16 0423  NA 139 143 136 137  K 4.2 4.2 4.1 3.7  CL 92* 94* 96* 96*  CO2 36* 39* 33* 33*  GLUCOSE 185* 109* 166* 162*  BUN 22* 19 15 11   CALCIUM 8.7* 9.1 8.7* 8.6*  CREATININE 0.68 0.60 0.56 0.60  GFRNONAA >60 >60 >60 >60  GFRAA >60 >60 >60 >60    LIVER FUNCTION TESTS:  Recent Labs  12/19/15 0827 12/19/15 1201  BILITOT 0.7 0.2*  AST 23 27  ALT 22 22  ALKPHOS 81 76  PROT 8.5* 7.9  ALBUMIN 3.3* 3.1*    Assessment and Plan: Pt with prior L3 kyphoplasty in February 2006 with additional vertebroplasty at same level in April 2006,  T11 vertebroplasty in 2009, and S1 sacroplasty in 2014. She has a known past medical history significant for COPD with chronic respiratory failure on 3 L nasal cannula, hypertension, osteoporosis, and depression who presented to the [place] ED on 10/14 after fall at home with subsequent lower back pain. MRI lumbar spine performed on 10/16 revealed acute/subacute burst fracture of L1 causing mild spinal stenosis due to retropulsion of bone into the canal with chronic compression fractures at T11 and L3 and mild spinal stenosis at L4-5. Request now received for L1 vertebroplasty/kyphoplasty. Imaging studies have been reviewed by Dr.  Estanislado Pandy and he feels patient is candidate for L1 VP/KP. Details/risks of procedure, including but not limited to, internal bleeding, infection, PMMA migration, injury to adjacent structures, inability to relieve back pain d/w pt with her understanding and consent. Procedure tent scheduled for 10/19 at 1000 am at [facility]. She would return to [name] following procedure.    Electronically Signed: D. Rowe Robert 10/13/2016, 12:40  PM   I spent a total of 30 minutes at the the patient's bedside AND on the patient's hospital floor or unit, greater than 50% of which was counseling/coordinating care for L1 vertebroplasty/kyphoplasty    Patient ID: [name], female   DOB: [birth year], 80 y.o.   MRN: [id number]

## 2016-10-13 NOTE — Progress Notes (Addendum)
PT Cancellation Note  Patient Details Name: KALYANA FLUHR MRN: [id number] DOB: 09-19-1936   Cancelled Treatment:     will see after Vertebral Plasty.  Pt is getting OOB with nursing to use [facility] but with much pain.    Rica Koyanagi  PTA [facility]  Acute  Rehab Pager      [phone]

## 2016-10-14 DIAGNOSIS — N3000 Acute cystitis without hematuria: Secondary | ICD-10-CM

## 2016-10-14 DIAGNOSIS — M545 Low back pain: Secondary | ICD-10-CM

## 2016-10-14 DIAGNOSIS — S32010A Wedge compression fracture of first lumbar vertebra, initial encounter for closed fracture: Secondary | ICD-10-CM

## 2016-10-14 DIAGNOSIS — J9611 Chronic respiratory failure with hypoxia: Secondary | ICD-10-CM

## 2016-10-14 LAB — URINE CULTURE

## 2016-10-14 NOTE — Progress Notes (Signed)
PT Cancellation Note  Patient Details Name: [name] MRN: [id number] DOB: 01-19-1936   Cancelled Treatment:       Pt vertebroplasty scheduled for yesterday deferred to 10/15/16.  Pt in "a lot of pain" this am.  PT deferred to after procedure.  Will follow at that time.    Sharronda Schweers 10/14/2016, 11:57 AM

## 2016-10-14 NOTE — Progress Notes (Signed)
[facility] PROGRESS NOTE  [name] [id number] DOB: 06-Sep-1936 DOA: 10/10/2016  PCP: [name], MD  Brief History/Interval Summary: 80 year old Caucasian female with a past medical history of COPD, chronic respiratory failure on home oxygen, hypertension, osteoporosis, presented after she had a mechanical fall resulting in lower back pain. Imaging studies revealed L1 fracture. She did not have any neurological deficits. She was hospitalized for pain control.  Reason for Visit: Acute back pain due to vertebral fracture  Consultants: Interventional radiology  Procedures: Plan is for kyphoplasty on 10/19  Antibiotics: None  Subjective/Interval History: Patient continues to have severe low back pain. Disappointed that her procedure will only be done tomorrow. Denies any nausea, vomiting  ROS: Denies any chest pain or shortness of breath  Objective:  Vital Signs  Vitals:   10/13/16 0600 10/13/16 1353 10/13/16 2131 10/14/16 0532  BP: 130/72 (!) 127/58 (!) 196/65 137/63  Pulse: (!) 101 91 81 70  Resp: 15 16 18 16   Temp: 97.6 F (36.4 C) 97.9 F (36.6 C) 97.9 F (36.6 C) 97.5 F (36.4 C)  TempSrc: Oral Oral Other (Comment) Oral  SpO2: 97% 98% 97% 100%  Weight:      Height:        Intake/Output Summary (Last 24 hours) at 10/14/16 1136 Last data filed at 10/14/16 1039  Gross per 24 hour  Intake              770 ml  Output              750 ml  Net               20 ml   Filed Weights   10/10/16 1930  Weight: 62.6 kg (138 lb)    General appearance: alert, cooperative, appears stated age and no distress Resp: clear to auscultation bilaterally Cardio: regular rate and rhythm, S1, S2 normal, no murmur, click, rub or gallop GI: soft, non-tender; bowel sounds normal; no masses,  no organomegaly Extremities: extremities normal, atraumatic, no cyanosis or edema Neurologic: Awake and alert. Oriented 3. Able to move both legs and left him without  any difficulty.  Lab Results:  Data Reviewed: I have personally reviewed following labs and imaging studies  CBC:  Recent Labs Lab 10/10/16 1200 10/11/16 0423  WBC 15.5* 10.2  NEUTROABS 14.3*  --   HGB 10.7* 10.8*  HCT 36.7 35.8*  MCV 88.6 85.6  PLT 394 0000000    Basic Metabolic Panel:  Recent Labs Lab 10/10/16 1200 10/11/16 0423  NA 136 137  K 4.1 3.7  CL 96* 96*  CO2 33* 33*  GLUCOSE 166* 162*  BUN 15 11  CREATININE 0.56 0.60  CALCIUM 8.7* 8.6*    GFR: Estimated Creatinine Clearance: 48.6 mL/min (by C-G formula based on SCr of 0.6 mg/dL).    Recent Results (from the past 240 hour(s))  Culture, Urine     Status: Abnormal   Collection Time: 10/13/16  7:50 AM  Result Value Ref Range Status   Specimen Description URINE, CATHETERIZED  Final   Special Requests NONE  Final   Culture MULTIPLE SPECIES PRESENT, SUGGEST RECOLLECTION (A)  Final   Report Status 10/14/2016 FINAL  Final      Radiology Studies: Mr Lumbar Spine W Wo Contrast  Result Date: 10/12/2016 CLINICAL DATA:  Back pain. Recent fall. History of osteoporosis and fractures. EXAM: MRI LUMBAR SPINE WITHOUT AND WITH CONTRAST TECHNIQUE: Multiplanar and multiecho pulse sequences of the lumbar spine were obtained  without and with intravenous contrast. CONTRAST:  45mL MULTIHANCE GADOBENATE DIMEGLUMINE 529 MG/ML IV SOLN COMPARISON:  Lumbar MRI 05/04/2013.  Lumbar radiographs 10/10/2016 FINDINGS: Segmentation:  Normal Alignment: Mild anterior slip L2-3. Mild retrolisthesis L3-4. This is unchanged from prior studies. Vertebrae: Chronic compression fracture T11 with cement augmentation unchanged Moderate compression fracture of L1 appears acute or subacute. This appears to be a burst fracture with fracture the posterior cortex of the vertebral body. There is diffuse bone marrow edema and enhancement in the bone marrow. There is fluid within the vertebral body. There is a small amount of ventral epidural fluid  bilaterally. There is retropulsion of bone into the canal with mild spinal stenosis. This appears to be a benign fracture. Chronic compression fracture L3 with cement vertebral augmentation unchanged from the prior MRI. Conus medullaris: Extends to the T12-L1 level and appears normal. Paraspinal and other soft tissues: Paraspinous muscles intact. No retroperitoneal mass. Disc levels: T12-L1: Mild disc degeneration. Mild spinal stenosis due to retropulsion of L1 into the canal. L1-2:  Negative L2-3: Mild spinal stenosis due to endplate spurring. Mild facet degeneration L3-4:  Disc bulging and spurring without significant spinal stenosis L4-5: Mild disc degeneration. Moderate facet hypertrophy causing mild spinal stenosis. L5-S1:  Mild facet degeneration without stenosis. IMPRESSION: Acute or subacute burst fracture of L1 causing mild spinal stenosis due to retropulsion of bone into the canal. In addition, ventral epidural fluid, likely blood, it is present contributing to spinal stenosis. Chronic compression fractures T11 and L3 Mild spinal stenosis L4-5. Electronically Signed   By: Franchot Gallo M.D.   On: 10/12/2016 17:37     Medications:  Scheduled: . [START ON 10/15/2016]  ceFAZolin (ANCEF) IV  2 g Intravenous to XRAY  . cefTRIAXone (ROCEPHIN)  IV  1 g Intravenous Q24H  . dexamethasone  4 mg Intravenous Q6H  . docusate sodium  100 mg Oral BID  . DULoxetine  60 mg Oral Daily  . methocarbamol  500 mg Oral QID   Continuous:  [id number] **OR** acetaminophen, albuterol, bisacodyl, morphine injection, ondansetron **OR** ondansetron (ZOFRAN) IV, oxyCODONE-acetaminophen, polyethylene glycol  Assessment/Plan:  Active Problems:   Hypertension   COPD (chronic obstructive pulmonary disease) (H[facility])   Chronic respiratory failure ([facility])   Lower back pain   L2 vertebral fracture ([facility])   Intractable pain   Acute cystitis without hematuria   Pain management    Severe lower back due to L1  vertebral fracture after the fall Pain appears to be well-controlled today compared to previous days. Patient was given steroids, which seems to have helped. Continue with Robaxin, hydrocodone and morphine as needed. Concern of possible epidural fluid likely blood therefore Lovenox discontinued. No evidence for neurological deficits. Plan is for kyphoplasty tomorrow. PT and OT is ongoing. May need to go to SNF.   Essential Hypertension Monitor blood pressure. Not on medication this time.  Acute cystitis without hematuria Has had acute urinary retention required a straight cath. Patient reported dysuria and urgency. UA was abnormal. Patient started on IV ceftriaxone. Urine culture showed multiple species. We will resend it.   History of COPD (chronic obstructive pulmonary disease) with chronic hypoxic respiratory failure on home oxygen Stable. Patient is currently on 2-3 L of oxygen. She has no respiratory symptoms.    Normocytic anemia Hemoglobin around baseline.  DVT Prophylaxis: SCDs    Code Status: Full code  Family Communication: Discussed with the patient  Disposition Plan: Await kyphoplasty. PT and OT ongoing.    LOS: 1 day  [facility]  [facility] Pager [phone] 10/14/2016, 11:36 AM  If 7PM-7AM, please contact night-coverage at [email or web address], password [facility]

## 2016-10-14 NOTE — Progress Notes (Signed)
Transportation to [facility] Radiology Dept for vertebraplasty arranged with [facility]. [facility] will arrive ~08:30 on 10/19 to transport pt to [facility] Radiology Dept.

## 2016-10-15 ENCOUNTER — Ambulatory Visit (HOSPITAL_COMMUNITY)
Admit: 2016-10-15 | Discharge: 2016-10-15 | Disposition: A | Discharge status: Other Acute Inpatient Hospital | Payer: Medicare Other | Attending: Internal Medicine | Admitting: Internal Medicine

## 2016-10-15 DIAGNOSIS — J449 Chronic obstructive pulmonary disease, unspecified: Secondary | ICD-10-CM | POA: Insufficient documentation

## 2016-10-15 DIAGNOSIS — M810 Age-related osteoporosis without current pathological fracture: Secondary | ICD-10-CM | POA: Insufficient documentation

## 2016-10-15 DIAGNOSIS — Z9981 Dependence on supplemental oxygen: Secondary | ICD-10-CM | POA: Insufficient documentation

## 2016-10-15 DIAGNOSIS — J9611 Chronic respiratory failure with hypoxia: Secondary | ICD-10-CM | POA: Insufficient documentation

## 2016-10-15 DIAGNOSIS — S32019A Unspecified fracture of first lumbar vertebra, initial encounter for closed fracture: Secondary | ICD-10-CM | POA: Insufficient documentation

## 2016-10-15 DIAGNOSIS — D649 Anemia, unspecified: Secondary | ICD-10-CM | POA: Insufficient documentation

## 2016-10-15 DIAGNOSIS — N3000 Acute cystitis without hematuria: Secondary | ICD-10-CM | POA: Insufficient documentation

## 2016-10-15 DIAGNOSIS — Z79899 Other long term (current) drug therapy: Secondary | ICD-10-CM | POA: Insufficient documentation

## 2016-10-15 DIAGNOSIS — W19XXXA Unspecified fall, initial encounter: Secondary | ICD-10-CM | POA: Insufficient documentation

## 2016-10-15 DIAGNOSIS — I10 Essential (primary) hypertension: Secondary | ICD-10-CM | POA: Insufficient documentation

## 2016-10-15 HISTORY — PX: IR GENERIC HISTORICAL: IMG1180011

## 2016-10-15 LAB — PROTIME-INR
INR: 1.05
Prothrombin Time: 13.8 seconds (ref 11.4–15.2)

## 2016-10-15 LAB — URINE CULTURE

## 2016-10-15 LAB — BASIC METABOLIC PANEL
Anion gap: 11 (ref 5–15)
BUN: 23 mg/dL — AB (ref 6–20)
CHLORIDE: 92 mmol/L — AB (ref 101–111)
CO2: 33 mmol/L — ABNORMAL HIGH (ref 22–32)
CREATININE: 0.66 mg/dL (ref 0.44–1.00)
Calcium: 9 mg/dL (ref 8.9–10.3)
GFR calc Af Amer: >60 mL/min (ref >60)
Glucose, Bld: 116 mg/dL — ABNORMAL HIGH (ref 65–99)
Potassium: 3.9 mmol/L (ref 3.5–5.1)
SODIUM: 136 mmol/L (ref 135–145)

## 2016-10-15 LAB — CBC WITH DIFFERENTIAL/PLATELET
BASOS ABS: 0 10*3/uL (ref 0.0–0.1)
BASOS PCT: 0 %
EOS PCT: 0 %
Eosinophils Absolute: 0 10*3/uL (ref 0.0–0.7)
HCT: 37.5 % (ref 36.0–46.0)
Hemoglobin: 11.2 g/dL — ABNORMAL LOW (ref 12.0–15.0)
Lymphocytes Relative: 17 %
Lymphs Abs: 1.5 10*3/uL (ref 0.7–4.0)
MCH: 25.9 pg — ABNORMAL LOW (ref 26.0–34.0)
MCHC: 29.9 g/dL — ABNORMAL LOW (ref 30.0–36.0)
MCV: 86.6 fL (ref 78.0–100.0)
MONO ABS: 1 10*3/uL (ref 0.1–1.0)
Monocytes Relative: 11 %
Neutro Abs: 6.5 10*3/uL (ref 1.7–7.7)
Neutrophils Relative %: 72 %
PLATELETS: 469 10*3/uL — AB (ref 150–400)
RBC: 4.33 MIL/uL (ref 3.87–5.11)
RDW: 13.9 % (ref 11.5–15.5)
WBC: 9 10*3/uL (ref 4.0–10.5)

## 2016-10-15 MED ORDER — BUPIVACAINE HCL (PF) 0.25 % IJ SOLN
INTRAMUSCULAR | Status: AC
  Filled 2016-10-15: qty 30

## 2016-10-15 MED ORDER — MIDAZOLAM HCL 2 MG/2ML IJ SOLN
INTRAMUSCULAR | Status: AC
  Filled 2016-10-15: qty 4

## 2016-10-15 MED ORDER — HYDROMORPHONE HCL 1 MG/ML IJ SOLN
INTRAMUSCULAR | Status: AC
  Filled 2016-10-15: qty 1

## 2016-10-15 MED ORDER — BUPIVACAINE HCL (PF) 0.5 % IJ SOLN
INTRAMUSCULAR | Status: AC | PRN
  Administered 2016-10-15: 15 mL

## 2016-10-15 MED ORDER — IOPAMIDOL (ISOVUE-300) INJECTION 61%
INTRAVENOUS | Status: AC
  Administered 2016-10-15: 5 mL
  Filled 2016-10-15: qty 50

## 2016-10-15 MED ORDER — TOBRAMYCIN SULFATE 1.2 G IJ SOLR
INTRAMUSCULAR | Status: AC
  Administered 2016-10-15: 0.1 g
  Filled 2016-10-15: qty 1.2

## 2016-10-15 MED ORDER — MIDAZOLAM HCL 2 MG/2ML IJ SOLN
INTRAMUSCULAR | Status: AC | PRN
  Administered 2016-10-15 (×2): 1 mg via INTRAVENOUS

## 2016-10-15 MED ORDER — FENTANYL CITRATE (PF) 100 MCG/2ML IJ SOLN
INTRAMUSCULAR | Status: AC | PRN
  Administered 2016-10-15 (×2): 25 ug via INTRAVENOUS

## 2016-10-15 MED ORDER — SODIUM CHLORIDE 0.9 % IV SOLN
INTRAVENOUS | Status: AC | PRN
  Administered 2016-10-15: 10 mL/h via INTRAVENOUS

## 2016-10-15 MED ORDER — HYDROMORPHONE HCL 1 MG/ML IJ SOLN
INTRAMUSCULAR | Status: AC | PRN
  Administered 2016-10-15: 1 mg via INTRAVENOUS

## 2016-10-15 MED ORDER — SODIUM CHLORIDE 0.9 % IV SOLN
INTRAVENOUS | Status: AC
  Administered 2016-10-15: 12:00:00 via INTRAVENOUS

## 2016-10-15 MED ORDER — CEFAZOLIN SODIUM-DEXTROSE 2-4 GM/100ML-% IV SOLN
INTRAVENOUS | Status: AC
  Filled 2016-10-15: qty 100

## 2016-10-15 MED ORDER — FENTANYL CITRATE (PF) 100 MCG/2ML IJ SOLN
INTRAMUSCULAR | Status: AC
  Filled 2016-10-15: qty 4

## 2016-10-15 NOTE — Procedures (Signed)
S/P L1 balloon KP 

## 2016-10-15 NOTE — Progress Notes (Addendum)
Pt very confused and agitated. Very hard to reorient and redirect. Pt wants to get out of bed alone and refuses to wear yellow safety socks. Feels that this is not her assigned room and her room is across the hall with her dresser drawer of belongs and wants to call Law enforcement. Pt stated "Im going to punch that teenage girl in the mouth" (referring to NT), because NT encouraged pt to put on safety socks and maintain bed alarm.   Call out to Ortencia Kick [phone])  to help with reorientation of pt.  Pt able to finally calm down and go to sleep. Pt maintained NPO since Midnight. Will continue to monitor per Doctor orders and unit policy.

## 2016-10-15 NOTE — Progress Notes (Signed)
[facility] PROGRESS NOTE  KHARLI TROUNG [id number] DOB: 11/15/1936 DOA: 10/10/2016  PCP: [name], MD  Brief History/Interval Summary: 80 year old Caucasian female with a past medical history of COPD, chronic respiratory failure on home oxygen, hypertension, osteoporosis, presented after she had a mechanical fall resulting in lower back pain. Imaging studies revealed L1 fracture. She did not have any neurological deficits. She was hospitalized for pain control.  Reason for Visit: Acute back pain due to vertebral fracture  Consultants: Interventional radiology  Procedures: Plan is for kyphoplasty on 10/19  Antibiotics: None  Subjective/Interval History: Patient had a rough night. Continues to have severe pain in her lower back. She is also quite upset when she was told that she may not be able to go to [facility] inpatient rehabilitation and may have to consider a skilled nursing facility. Denies any other complaints at this time. She was confused earlier today, which is thought to be secondary to pain medications. No longer confused.   ROS: Denies any chest pain or shortness of breath  Objective:  Vital Signs  Vitals:   10/13/16 2131 10/14/16 0532 10/14/16 1335 10/14/16 2130  BP: (!) 196/65 137/63 101/74 122/84  Pulse: 81 70 88   Resp: 18 16 16 18   Temp: 97.9 F (36.6 C) 97.5 F (36.4 C) 97.5 F (36.4 C) 98.4 F (36.9 C)  TempSrc: Other (Comment) Oral Oral Oral  SpO2: 97% 100% 97% 92%  Weight:      Height:        Intake/Output Summary (Last 24 hours) at 10/15/16 0801 Last data filed at 10/15/16 0142  Gross per 24 hour  Intake              530 ml  Output              650 ml  Net             -120 ml   Filed Weights   10/10/16 1930  Weight: 62.6 kg (138 lb)    General appearance: alert, cooperative. Somewhat upset. Resp: clear to auscultation bilaterally Cardio: regular rate and rhythm, S1, S2 normal, no murmur, click, rub or gallop GI:  soft, non-tender; bowel sounds normal; no masses,  no organomegaly Extremities: extremities normal, atraumatic, no cyanosis or edema Neurologic: Awake and alert. Oriented 3. Able to move both legs and left him without any difficulty.  Lab Results:  Data Reviewed: I have personally reviewed following labs and imaging studies  CBC:  Recent Labs Lab 10/10/16 1200 10/11/16 0423 10/15/16 0428  WBC 15.5* 10.2 9.0  NEUTROABS 14.3*  --  6.5  HGB 10.7* 10.8* 11.2*  HCT 36.7 35.8* 37.5  MCV 88.6 85.6 86.6  PLT 394 353 469*    Basic Metabolic Panel:  Recent Labs Lab 10/10/16 1200 10/11/16 0423 10/15/16 0428  NA 136 137 136  K 4.1 3.7 3.9  CL 96* 96* 92*  CO2 33* 33* 33*  GLUCOSE 166* 162* 116*  BUN 15 11 23*  CREATININE 0.56 0.60 0.66  CALCIUM 8.7* 8.6* 9.0    GFR: Estimated Creatinine Clearance: 48.6 mL/min (by C-G formula based on SCr of 0.66 mg/dL).    Recent Results (from the past 240 hour(s))  Culture, Urine     Status: Abnormal   Collection Time: 10/13/16  7:50 AM  Result Value Ref Range Status   Specimen Description URINE, CATHETERIZED  Final   Special Requests NONE  Final   Culture MULTIPLE SPECIES PRESENT, SUGGEST RECOLLECTION (A)  Final   Report Status 10/14/2016 FINAL  Final      Radiology Studies: No results found.   Medications:  Scheduled: .  ceFAZolin (ANCEF) IV  2 g Intravenous to XRAY  . cefTRIAXone (ROCEPHIN)  IV  1 g Intravenous Q24H  . docusate sodium  100 mg Oral BID  . DULoxetine  60 mg Oral Daily  . methocarbamol  500 mg Oral QID   Continuous:  [id number] **OR** acetaminophen, albuterol, bisacodyl, morphine injection, ondansetron **OR** ondansetron (ZOFRAN) IV, oxyCODONE-acetaminophen, polyethylene glycol  Assessment/Plan:  Active Problems:   Hypertension   COPD (chronic obstructive pulmonary disease) (HCC)   Chronic respiratory failure (HCC)   Back pain   L2 vertebral fracture (HCC)   Intractable pain   Acute  cystitis without hematuria   Pain management   Closed compression fracture of L1 lumbar vertebra (HCC)    Severe lower back due to L1 vertebral fracture after the fall Continues to have severe pain in her lower back. Patient was given steroids. Continue with Robaxin, hydrocodone and morphine as needed. Concern of possible epidural fluid likely blood therefore Lovenox discontinued. No evidence for neurological deficits. Plan is for kyphoplasty today. PT and OT is ongoing. May need to go to SNF. Patient requesting evaluation by inpatient rehabilitation, although have told her that it is unlikely she meets the criteria for inpatient rehabilitation. We will go ahead and consult them for now.  Essential Hypertension Monitor blood pressure. Not on medication this time.  Acute cystitis without hematuria Has had acute urinary retention required a straight cath. Patient reported dysuria and urgency. UA was abnormal. Patient started on IV ceftriaxone. Urine culture showed multiple species. Another sample has been sent for culture.  History of COPD (chronic obstructive pulmonary disease) with chronic hypoxic respiratory failure on home oxygen Stable. Patient is currently on 2-3 L of oxygen. She has no respiratory symptoms.    Normocytic anemia Hemoglobin around baseline.  DVT Prophylaxis: SCDs    Code Status: Full code  Family Communication: Discussed with the patient  Disposition Plan: Await kyphoplasty. PT and OT ongoing. Anticipate discharge tomorrow.    LOS: 2 days   [place] [facility] Pager [phone] 10/15/2016, 8:01 AM  If 7PM-7AM, please contact night-coverage at [email or web address], password [facility]

## 2016-10-15 NOTE — Progress Notes (Signed)
Rehab admissions - Patient has [employer].  It is unlikely that they would authorize an inpatient rehab stay given current diagnosis.  Likely insurance would approve HH or perhaps SNF.  Need to see how patient does post procedure with therapies.  Call me for questions.  [phone]

## 2016-10-15 NOTE — Progress Notes (Signed)
Patient returned from procedure via EMS.  VSS. Pain under control. Pt aaox4. Orders reviewed. Diet resumed.  Will cont to monitor pt.

## 2016-10-15 NOTE — Progress Notes (Signed)
Pt given 2mg  morphine prior to transport via [facility] to [place]. Report given to [facility] RN.

## 2016-10-15 NOTE — Discharge Instructions (Signed)
1.No stooping,bending or lifting more than 10 lbs for 2 weeks. °2.Use walker to ambulate for 2 weeks. °3.RTC PRN in 2 weeks °

## 2016-10-15 NOTE — Sedation Documentation (Signed)
Pt transported back to [facility] by [facility]. Awake and alert. In no distress.

## 2016-10-15 NOTE — Progress Notes (Signed)
CM spoke with [facility] rep, LaShauna (sp?) to update on pt's progress (kyphoplasyty).  Plan is for [facility] to render [facility] services.  CM will NEED to fax [facility] orders to [facility] on day of discharge [phone].  Will continue to follow.

## 2016-10-16 DIAGNOSIS — R52 Pain, unspecified: Secondary | ICD-10-CM

## 2016-10-16 LAB — BASIC METABOLIC PANEL
Anion gap: 7 (ref 5–15)
BUN: 23 mg/dL — AB (ref 6–20)
CO2: 34 mmol/L — ABNORMAL HIGH (ref 22–32)
CREATININE: 0.6 mg/dL (ref 0.44–1.00)
Calcium: 8.5 mg/dL — ABNORMAL LOW (ref 8.9–10.3)
Chloride: 96 mmol/L — ABNORMAL LOW (ref 101–111)
GFR calc Af Amer: >60 mL/min (ref >60)
GLUCOSE: 134 mg/dL — AB (ref 65–99)
Potassium: 3.8 mmol/L (ref 3.5–5.1)
SODIUM: 137 mmol/L (ref 135–145)

## 2016-10-16 LAB — CBC
HCT: 35.3 % — ABNORMAL LOW (ref 36.0–46.0)
Hemoglobin: 10.6 g/dL — ABNORMAL LOW (ref 12.0–15.0)
MCH: 25.7 pg — AB (ref 26.0–34.0)
MCHC: 30 g/dL (ref 30.0–36.0)
MCV: 85.7 fL (ref 78.0–100.0)
PLATELETS: 433 10*3/uL — AB (ref 150–400)
RBC: 4.12 MIL/uL (ref 3.87–5.11)
RDW: 13.9 % (ref 11.5–15.5)
WBC: 7.6 10*3/uL (ref 4.0–10.5)

## 2016-10-16 MED ORDER — DULOXETINE HCL 60 MG PO CPEP: 60.0000 mg | ORAL_CAPSULE | Freq: Every day | ORAL | 1 refills | Status: DC

## 2016-10-16 MED ORDER — OXYCODONE-ACETAMINOPHEN 7.5-325 MG PO TABS
1.0000 | ORAL_TABLET | ORAL | Status: DC | PRN
  Administered 2016-10-16 – 2016-10-17 (×7): 2 via ORAL
  Filled 2016-10-16 (×7): qty 2

## 2016-10-16 MED ORDER — OXYCODONE-ACETAMINOPHEN 7.5-325 MG PO TABS: 1.0000 | ORAL_TABLET | ORAL | 0 refills | Status: DC | PRN

## 2016-10-16 MED ORDER — CEFPODOXIME PROXETIL 200 MG PO TABS
200.0000 mg | ORAL_TABLET | Freq: Two times a day (BID) | ORAL | Status: DC
  Administered 2016-10-16 – 2016-10-17 (×3): 200 mg via ORAL
  Filled 2016-10-16 (×3): qty 1

## 2016-10-16 MED ORDER — METHOCARBAMOL 500 MG PO TABS
500.0000 mg | ORAL_TABLET | Freq: Four times a day (QID) | ORAL | 0 refills | Status: DC
Start: 2016-10-16 — End: 2018-06-09

## 2016-10-16 MED ORDER — SENNA 8.6 MG PO TABS
1.0000 | ORAL_TABLET | Freq: Two times a day (BID) | ORAL | Status: DC
  Administered 2016-10-16 – 2016-10-17 (×3): 8.6 mg via ORAL
  Filled 2016-10-16 (×3): qty 1

## 2016-10-16 MED ORDER — CEFPODOXIME PROXETIL 200 MG PO TABS: 200.0000 mg | ORAL_TABLET | Freq: Two times a day (BID) | ORAL | 0 refills | Status: DC

## 2016-10-16 MED ORDER — SENNA 8.6 MG PO TABS: 1.0000 | ORAL_TABLET | Freq: Two times a day (BID) | ORAL | 0 refills | Status: DC

## 2016-10-16 MED ORDER — POLYETHYLENE GLYCOL 3350 17 G PO PACK: 17.0000 g | PACK | Freq: Every day | ORAL | 0 refills | Status: DC | PRN

## 2016-10-16 NOTE — Progress Notes (Signed)
[facility] PROGRESS NOTE  [name] [id number] DOB: 27-Mar-1936 DOA: 10/10/2016  PCP: [name], MD  Brief History/Interval Summary: 80 year old Caucasian female with a past medical history of COPD, chronic respiratory failure on home oxygen, hypertension, osteoporosis, presented after she had a mechanical fall resulting in lower back pain. Imaging studies revealed L1 fracture. She did not have any neurological deficits. She was hospitalized for pain control.  Reason for Visit: Acute back pain due to vertebral fracture  Consultants: Interventional radiology  Procedures: Kyphoplasty on 10/19  Antibiotics: None  Subjective/Interval History: Patient continued to have back pain through the night. She is somewhat disappointed that the kyphoplasty has not helped her pain significantly. She is very upset that she is not able to go to the inpatient rehabilitation unit at [facility]. She adamantly refuses any attempts at discussing short-term rehabilitation at a skilled nursing facility.   ROS: Denies any chest pain or shortness of breath  Objective:  Vital Signs  Vitals:   10/15/16 1140 10/15/16 1300 10/15/16 2200 10/16/16 0520  BP: (!) 144/59 (!) 141/49 138/61 (!) 159/68  Pulse: 84 95 87 77  Resp: 18 18 16 16   Temp: 97.5 F (36.4 C) 97.5 F (36.4 C) 97.6 F (36.4 C) 97.5 F (36.4 C)  TempSrc: Oral Oral Oral Oral  SpO2: 98% 98% 96% 98%  Weight:      Height:        Intake/Output Summary (Last 24 hours) at 10/16/16 0925 Last data filed at 10/16/16 0805  Gross per 24 hour  Intake             1020 ml  Output             1080 ml  Net              -60 ml   Filed Weights   10/10/16 1930  Weight: 62.6 kg (138 lb)    General appearance: alert, cooperative. Somewhat upsetAnd anxious. Resp: clear to auscultation bilaterally Cardio: regular rate and rhythm, S1, S2 normal, no murmur, click, rub or gallop GI: soft, non-tender; bowel sounds  normal; no masses,  no organomegaly Extremities: extremities normal, atraumatic, no cyanosis or edema Neurologic: Awake and alert. Oriented 3. Able to move both legs without any difficulty.  Lab Results:  Data Reviewed: I have personally reviewed following labs and imaging studies  CBC:  Recent Labs Lab 10/10/16 1200 10/11/16 0423 10/15/16 0428 10/16/16 0502  WBC 15.5* 10.2 9.0 7.6  NEUTROABS 14.3*  --  6.5  --   HGB 10.7* 10.8* 11.2* 10.6*  HCT 36.7 35.8* 37.5 35.3*  MCV 88.6 85.6 86.6 85.7  PLT 394 353 469* 433*    Basic Metabolic Panel:  Recent Labs Lab 10/10/16 1200 10/11/16 0423 10/15/16 0428 10/16/16 0502  NA 136 137 136 137  K 4.1 3.7 3.9 3.8  CL 96* 96* 92* 96*  CO2 33* 33* 33* 34*  GLUCOSE 166* 162* 116* 134*  BUN 15 11 23* 23*  CREATININE 0.56 0.60 0.66 0.60  CALCIUM 8.7* 8.6* 9.0 8.5*    GFR: Estimated Creatinine Clearance: 48.6 mL/min (by C-G formula based on SCr of 0.6 mg/dL).    Recent Results (from the past 240 hour(s))  Culture, Urine     Status: Abnormal   Collection Time: 10/13/16  7:50 AM  Result Value Ref Range Status   Specimen Description URINE, CATHETERIZED  Final   Special Requests NONE  Final   Culture MULTIPLE SPECIES PRESENT, SUGGEST  RECOLLECTION (A)  Final   Report Status 10/14/2016 FINAL  Final  Urine culture     Status: Abnormal   Collection Time: 10/14/16 11:50 AM  Result Value Ref Range Status   Specimen Description URINE, CLEAN CATCH  Final   Special Requests NONE  Final   Culture (A)  Final    <10,000 COLONIES/mL INSIGNIFICANT GROWTH Performed at [facility]    Report Status 10/15/2016 FINAL  Final      Radiology Studies: No results found.   Medications:  Scheduled: . cefpodoxime  200 mg Oral Q12H  . DULoxetine  60 mg Oral Daily  . methocarbamol  500 mg Oral QID  . senna  1 tablet Oral BID   Continuous:  [id number] **OR** acetaminophen, albuterol, bisacodyl, morphine injection,  ondansetron **OR** ondansetron (ZOFRAN) IV, oxyCODONE-acetaminophen, polyethylene glycol  Assessment/Plan:  Active Problems:   Hypertension   COPD (chronic obstructive pulmonary disease) (HCC)   Chronic respiratory failure (HCC)   Back pain   L2 vertebral fracture (HCC)   Intractable pain   Acute cystitis without hematuria   Pain management   Closed compression fracture of L1 lumbar vertebra (HCC)    Severe lower back due to L1 vertebral fracture after the fall Continues to have severe pain in her lower back. Patient was given steroids. Continue with Robaxin, hydrocodone and morphine as needed. Concern of possible epidural fluid likely blood therefore Lovenox discontinued. No evidence for neurological deficits. Patient is status post kyphoplasty. Continues to have ongoing pain. Repeat PT evaluation is pending. Patient refuses skilled nursing facility. Have told her that she will, in all likelihood, not qualify for inpatient rehabilitation.   Essential Hypertension Monitor blood pressure. Not on medication this time.  Acute cystitis without hematuria Has had acute urinary retention required a straight cath. Patient reported dysuria and urgency. UA was abnormal. Patient started on IV ceftriaxone. Urine culture showed multiple species. Repeat sample, also did not show any growth. Change to Vantin.   History of COPD (chronic obstructive pulmonary disease) with chronic hypoxic respiratory failure on home oxygen Stable. Patient is currently on 2-3 L of oxygen. She has no respiratory symptoms.    Normocytic anemia Hemoglobin around baseline.  DVT Prophylaxis: SCDs    Code Status: Full code  Family Communication: Discussed with the patient  Disposition Plan: Await repeat PT evaluation. Increase oral pain medications. Patient reassured. Discharge in 1-2 days.    LOS: 3 days   [place] [facility] Pager [phone] 10/16/2016, 9:25 AM  If 7PM-7AM, please contact  night-coverage at [email or web address], password [email or web address]

## 2016-10-16 NOTE — Progress Notes (Signed)
Physical Therapy Treatment Patient Details Name: SOUTHERN FLANDERS MRN: [id number] DOB: 09-08-1936 Today's Date: 10/16/2016    History of Present Illness 80 y/o female admitted from home s/p fall with c/o back pain.  X-ray report 25% L2 comression fx.  Pt wtih hx of COPD, chronic back pain, osteoporosis, home O2 dependency and, per pt, "several kyphoplasties.    PT Comments    Pt sitting EOB indep on arrival.  Attempted amb however pt unable to due back pain.  Required increased time to mobilize.    Follow Up Recommendations  SNF;Home health PT (will need 24/7 if D/C to home)     Equipment Recommendations       Recommendations for Other Services       Precautions / Restrictions Precautions Precautions: Back;Fall Restrictions Weight Bearing Restrictions: No    Mobility  Bed Mobility               General bed mobility comments: NT pt sitting EOB on arrival  Transfers Overall transfer level: Needs assistance Equipment used: Rolling walker (2 wheeled) Transfers: Sit to/from [facility] Sit to Stand: Min assist;Mod assist Stand pivot transfers: Min assist;Mod assist       General transfer comment: assisted from elevated bed to standing to attempt amb with 50% VC's on proper hand placement and 75% VC's for turn completion prior to sit  Ambulation/Gait Ambulation/Gait assistance: Min assist;+2 safety/equipment Ambulation Distance (Feet): 2 Feet Assistive device: Rolling walker (2 wheeled) Gait Pattern/deviations: Step-to pattern;Decreased step length - right;Decreased step length - left Gait velocity: decreased   General Gait Details: limited amb distance due to pain.  Excessive WBing thru walker B LE due to pain and limited activity tolerance due to pain.     Stairs            Wheelchair Mobility    Modified Rankin (Stroke Patients Only)       Balance                                    Cognition Arousal/Alertness:  Awake/alert Behavior During Therapy: WFL for tasks assessed/performed Overall Cognitive Status: Within Functional Limits for tasks assessed                      Exercises      General Comments        Pertinent Vitals/Pain Pain Assessment: 0-10 Pain Score: 10-Worst pain ever Pain Location: back Pain Descriptors / Indicators: Aching;Grimacing Pain Intervention(s): Monitored during session;Repositioned;Ice applied    Home Living                      Prior Function            PT Goals (current goals can now be found in the care plan section) Progress towards PT goals: Progressing toward goals    Frequency    Min 3X/week      PT Plan Current plan remains appropriate    Co-evaluation             End of Session Equipment Utilized During Treatment: Gait belt Activity Tolerance: Patient tolerated treatment well Patient left: in chair;with call bell/phone within reach     Time: 1115-1145 PT Time Calculation (min) (ACUTE ONLY): 30 min  Charges:  $Gait Training: 8-22 mins $Therapeutic Activity: 8-22 mins  G Codes:      Rica Koyanagi  PTA [facility]  Acute  Rehab Pager      [phone]

## 2016-10-16 NOTE — Progress Notes (Signed)
    Chief Complaint: Patient was seen today for follow up kyphoplasty   Supervising Physician: Luanne Bras  Patient Status: [facility] - In-pt  Subjective: S/p L1 kyphoplasty yesterday., Did well, no immediate complications. States pain is improved today, but still present.   Objective: Physical Exam: BP (!) 144/60 (BP Location: Right Arm)   Pulse 93   Temp 97.6 F (36.4 C) (Oral)   Resp 16   Ht 5' (1.524 m)   Wt 138 lb (62.6 kg)   SpO2 94%   BMI 26.95 kg/m  Back: puncture site clean, dressing dry, soft, no hematoma   Current Facility-Administered Medications:  .  acetaminophen (TYLENOL) tablet 650 mg, 650 mg, Oral, Q6H PRN **OR** acetaminophen (TYLENOL) suppository 650 mg, 650 mg, Rectal, Q6H PRN, Dawood S Elgergawy, MD .  albuterol (PROVENTIL) (2.5 MG/3ML) 0.083% nebulizer solution 2.5 mg, 2.5 mg, Nebulization, Q4H PRN, Albertine Patricia, MD .  bisacodyl (DULCOLAX) suppository 10 mg, 10 mg, Rectal, Daily PRN, Albertine Patricia, MD, 10 mg at 10/14/16 1403 .  cefpodoxime (VANTIN) tablet 200 mg, 200 mg, Oral, Q12H, Bonnielee Haff, MD, 200 mg at 10/16/16 1058 .  DULoxetine (CYMBALTA) DR capsule 60 mg, 60 mg, Oral, Daily, Dron Tanna Furry, MD, 60 mg at 10/16/16 0946 .  methocarbamol (ROBAXIN) tablet 500 mg, 500 mg, Oral, QID, Dron Tanna Furry, MD, 500 mg at 10/16/16 0946 .  morphine 2 MG/ML injection 1-2 mg, 1-2 mg, Intravenous, Q3H PRN, Albertine Patricia, MD, 2 mg at 10/16/16 0843 .  ondansetron (ZOFRAN) tablet 4 mg, 4 mg, Oral, Q6H PRN, 4 mg at 10/12/16 0256 **OR** ondansetron (ZOFRAN) injection 4 mg, 4 mg, Intravenous, Q6H PRN, Albertine Patricia, MD, 4 mg at 10/14/16 0928 .  oxyCODONE-acetaminophen (PERCOCET) 7.5-325 MG per tablet 1-2 tablet, 1-2 tablet, Oral, Q4H PRN, Bonnielee Haff, MD, 2 tablet at 10/16/16 1058 .  polyethylene glycol (MIRALAX / GLYCOLAX) packet 17 g, 17 g, Oral, Daily PRN, Albertine Patricia, MD, 17 g at 10/14/16 1158 .  senna (SENOKOT) tablet  8.6 mg, 1 tablet, Oral, BID, Bonnielee Haff, MD, 8.6 mg at 10/16/16 0946  Labs: CBC  Recent Labs  10/15/16 0428 10/16/16 0502  WBC 9.0 7.6  HGB 11.2* 10.6*  HCT 37.5 35.3*  PLT 469* 433*   BMET  Recent Labs  10/15/16 0428 10/16/16 0502  NA 136 137  K 3.9 3.8  CL 92* 96*  CO2 33* 34*  GLUCOSE 116* 134*  BUN 23* 23*  CREATININE 0.66 0.60  CALCIUM 9.0 8.5*   LFT No results for input(s): PROT, ALBUMIN, AST, ALT, ALKPHOS, BILITOT, BILIDIR, IBILI, LIPASE in the last 72 hours. PT/INR  Recent Labs  10/15/16 0428  LABPROT 13.8  INR 1.05     Studies/Results: No results found.  Assessment/Plan: S/p L1 KP-improved. Hopefully will continue to see pain improvement over next 7-10 days Discussed post op care with pt. She does not want to go to rehab facility, but will participate in home PT Encourage use of RW at all times for the next 2 weeks. Can follow up with Dr. Estanislado Pandy prn. Can call Anderson Malta at [phone] to schedule follow up appt if still having pain at 2 weeks.     LOS: 3 days   I spent a total of 20 minutes in face to face in clinical consultation, greater than 50% of which was counseling/coordinating care for KP  Ascencion Dike PA-C 10/16/2016 1:56 PM

## 2016-10-16 NOTE — Progress Notes (Signed)
Occupational Therapy Treatment Patient Details Name: [name] MRN: [id number] DOB: 12-Dec-1936 Today's Date: 10/16/2016    History of present illness 81 y/o female admitted from home s/p fall with c/o back pain.  X-ray report 25% L2 comression fx.  Pt wtih hx of COPD, chronic back pain, osteoporosis, home O2 dependency and, per pt, "several kyphoplasties.   OT comments  Pt making progress with functional goals, however requires cues for safety using RW and encouragement to increase activity. Pt is limited by pain  Follow Up Recommendations  Supervision/Assistance - 24 hour;Home health OT (may need SNF if unable to 24 hour sup/assist at home)    Equipment Recommendations  ADL A/E kit    Recommendations for Other Services      Precautions / Restrictions Precautions Precautions: Back;Fall Precaution Comments: reviewed back precautions with pt Restrictions Weight Bearing Restrictions: No       Mobility Bed Mobility Overal bed mobility: Needs Assistance Bed Mobility: Sit to Sidelying;Rolling Rolling: Min assist       Sit to sidelying: Mod assist General bed mobility comments: pt able to assist scooting to [facility]  Transfers Overall transfer level: Needs assistance Equipment used: Rolling walker (2 wheeled) Transfers: Sit to/from [facility] Sit to Stand: Min assist;Mod assist Stand pivot transfers: Min assist;Mod assist       General transfer comment: cues for correct hand placement and safe technique during stand - sit transitions    Balance Overall balance assessment: Needs assistance   Sitting balance-Leahy Scale: Good       Standing balance-Leahy Scale: Fair                     ADL Overall ADL's : Needs assistance/impaired     Grooming: Set up;Sitting   Upper Body Bathing: Set up;Sitting   Lower Body Bathing: Maximal assistance   Upper Body Dressing : Set up;Sitting   Lower Body Dressing: Maximal assistance   Toilet  Transfer: BSC;RW;Cueing for safety;Minimal assistance;Moderate assistance Toilet Transfer Details (indicate cue type and reason): cues for correct hand placement and safe technique during stand - sit transitions Toileting- Clothing Manipulation and Hygiene: Moderate assistance   Tub/ Shower Transfer: Minimal assistance;Moderate assistance;3 in 1;Cueing for safety     General ADL Comments: pt educated on ADL A/E for home use      Vision  no change from baseline                              Cognition   Behavior During Therapy: WFL for tasks assessed/performed Overall Cognitive Status: Within Functional Limits for tasks assessed                       Extremity/Trunk Assessment   WFL                        General Comments  pt pleasant    Pertinent Vitals/ Pain       Pain Assessment: 0-10 Pain Score: 7  Pain Location: back Pain Descriptors / Indicators: Aching;Grimacing;Guarding Pain Intervention(s): Limited activity within patient's tolerance;Monitored during session;Premedicated before session;Repositioned  Home Living Family/patient expects to be discharged to:: Private residence  Frequency  Min 2X/week        Progress Toward Goals  OT Goals(current goals can now be found in the care plan section)  Progress towards OT goals: Progressing toward goals     Plan Discharge plan remains appropriate                     End of Session Equipment Utilized During Treatment: Gait belt;Rolling walker   Activity Tolerance Patient limited by pain   Patient Left in bed;with call bell/phone within reach;with bed alarm set             Time: 1220-1251 OT Time Calculation (min): 31 min  Charges: OT General Charges $OT Visit: 1 Procedure OT Treatments $Self Care/Home Management : 8-22 mins $Therapeutic Activity: 8-22 mins  Britt Bottom 10/16/2016, 1:39 PM

## 2016-10-16 NOTE — Progress Notes (Signed)
OT Cancellation Note  Patient Details Name: [name] MRN: [id number] DOB: [birth year]   Cancelled Treatment:    Reason Eval/Treat Not Completed: Pain limiting ability to participate;Fatigue/lethargy limiting ability to participate. Pt just got back in bed from RN assist to [facility] and pain has increased and due for next pain meds shortly. Pt requested OT return later  Britt Bottom 10/16/2016, 10:08 AM

## 2016-10-16 NOTE — Discharge Instructions (Signed)
Balloon Kyphoplasty Balloon kyphoplasty is a procedure to treat a spinal compression fracture, which is a collapse of the bones that form the spine (vertebrae). With this type of fracture, the vertebrae become squashed (compressed) into a wedge shape, and this causes pain. In this procedure, the collapsed vertebrae are expanded with a balloon, and bone cement is injected into them to strengthen them. LET [facility] CARE PROVIDER KNOW ABOUT:  Any allergies you have.  All medicines you are taking, including vitamins, herbs, eye drops, creams, and over-the-counter medicines.  Previous problems you or members of your family have had with the use of anesthetics.  Any blood disorders you have.  Previous surgeries you have had.  Any medical conditions you have.  Whether you are pregnant or may be pregnant. RISKS AND COMPLICATIONS Generally, this is a safe procedure. However, problems may occur, including:  Infection.  Bleeding.  Allergic reactions to medicines.  Damage to other structures or organs.  Leaking of bone cement into other parts of the body. BEFORE THE PROCEDURE  Follow instructions from your health care provider about eating or drinking restrictions.  Ask your health care provider about:  Changing or stopping your regular medicines. This is especially important if you are taking diabetes medicines or blood thinners.  Taking medicines such as aspirin and ibuprofen. These medicines can thin your blood. Do not take these medicines before your procedure if your health care provider instructs you not to.  Ask your health care provider how your surgical site will be marked or identified.  You may be given antibiotic medicine to help prevent infection.  Do not use tobacco products, including cigarettes, chewing tobacco, or e-cigarettes. If you need help quitting, ask your health care provider.  Plan to have someone take you home after the procedure.  If you go home  right after the procedure, plan to have someone with you for 24 hours. PROCEDURE  To reduce your risk of infection:  Your health care team will wash or sanitize their hands.  Your skin will be washed with soap.  An IV tube will be inserted into one of your veins.  You will be given one or more of the following:  A medicine to help you relax (sedative).  A medicine to numb the area (local anesthetic).  A medicine to make you fall asleep (general anesthetic).  Your surgeon will use an X-ray machine to see your spinal compression fracture.  Two small incisions will be made near your spine.  A thin tube will be inserted into your spine. Through this tube, the balloon will be placed in your spine where the fractures are.  The balloon will be inflated. This will create space and push the bone back toward its normal height and shape.  The balloon will be removed.  The newly created space in your spine will be filled with bone cement.  When the cement hardens, the tube in your spine will be removed.  Your incisions will be closed with stitches (sutures), skin glue, or adhesive strips.  A bandage (dressing) may be used to cover your incisions. The procedure may vary among health care providers and hospitals. AFTER THE PROCEDURE  Your blood pressure, heart rate, breathing rate, and blood oxygen level will be monitored often until the medicines you were given have worn off.  You will have some pain. Pain medicine will be available to help you.   This information is not intended to replace advice given to you by your  health care provider. Make sure you discuss any questions you have with your health care provider.   Document Released: 11/19/2004 Document Revised: 09/04/2015 Document Reviewed: 04/08/2015 Elsevier Interactive Patient Education 2016 [employer]   Vertebral Fracture A vertebral fracture is a break in one of the bones that make up the spine (vertebrae). The  vertebrae are stacked on top of each other to form the spinal column. They support the body and protect the spinal cord. The vertebral column has an upper part (cervical spine), a middle part (thoracic spine), and a lower part (lumbar spine). Most vertebral fractures occur in the thoracic spine or lumbar spine. There are three main types of vertebral fractures:  Flexion fracture. This happens when vertebrae collapse. Vertebrae can collapse:  In the front (compression fracture). This type of fracture is common in people who have a condition that causes their bones to be weak and brittle (osteoporosis). The fracture can make a person lose height.  In the front and back (axial burst fracture).  Extension fracture. This happens when an external force pulls apart the vertebrae.  Rotation fracture. This happens when the spine bends extremely in one direction. This type can cause a piece of a vertebra to break off (transverse process fracture) or move out of its normal position (fracture dislocation). This type of fracture has a high risk for spinal cord injury. Vertebral fractures can range from mild to very severe. The most severe types are those that cause the broken bones to move out of place (unstable) and those that injure or press on the spinal cord. CAUSES This condition is usually caused by a forceful injury. This type of injury commonly results from:  Car accidents.  Falling or jumping from a great height.  Collisions in contact sports.  Violent acts, such as an assault or a gunshot wound. RISK FACTORS This injury is more likely to happen to people who:  Have osteoporosis.  Participate in contact sports.  Are in situations that could result in falls or other violent injuries. SYMPTOMS Symptoms of this injury depend on the location and the type of fracture. The most common symptom is back pain that gets worse with movement. You may also have trouble standing or walking. If a  fracture has damaged your spinal cord or is pressing on it, you may also have:  Numbness.  Tingling.  Weakness.  Loss of movement.  Loss of bowel or bladder control. DIAGNOSIS This injury may be diagnosed based on symptoms, medical history, and a physical exam. You may also have imaging tests to confirm the diagnosis. These may include:  Spine X-ray.  CT scan.  MRI. TREATMENT Treatment for this injury depends on the type of fracture. If your fracture is stable and does not affect your spinal cord, it may heal with nonsurgical treatment, such as:  Taking pain medicine.  Wearing a cast or a brace.  Doing physical therapy exercises. If your vertebral fracture is unstable or it affects your spinal cord, you may need surgical treatment, such as:  Laminectomy. This procedure involves removing the part of a vertebra that is pushing on the spinal cord (spinal decompression surgery). Bone fragments may also be removed.  Spinal fusion. This procedure is used to stabilize an unstable fracture. Vertebrae may be joined together with a piece of bone from another part of your body (graft) and held in place with rods, plates, or screws.  Vertebroplasty. In this procedure, bone cement is used to rebuild collapsed vertebrae. HOME CARE  INSTRUCTIONS General Instructions  Take medicines only as directed by your health care provider.  Do not drive or operate heavy machinery while taking pain medicine.  If directed, apply ice to the injured area:  Put ice in a plastic bag.  Place a towel between your skin and the bag.  Leave the ice on for 30 minutes every two hours at first. Then apply the ice as needed.  Wear your neck brace or back brace as directed by your health care provider.  Do not drink alcohol. Alcohol can interfere with your treatment.  Keep all follow-up visits as directed by your health care provider. This is important. It can help to prevent permanent injury, disability,  and long-lasting (chronic) pain. Activity  Stay in bed (on bed rest) only as directed by your health care provider. Being on bed rest for too long can make your condition worse.  Return to your normal activities as directed by your health care provider. Ask what activities are safe for you.  Do exercises to improve motion and strength in your back (physical therapy), as recommended by your health care provider.   Exercise regularly as directed by your health care provider. SEEK MEDICAL CARE IF:  You have a fever.  You develop a cough that makes your pain worse.  Your pain medicine is not helping.  Your pain does not get better over time.  You cannot return to your normal activities as planned or expected. SEEK IMMEDIATE MEDICAL CARE IF:  Your pain is very bad and it suddenly gets worse.  You are unable to move any body part (paralysis) that is below the level of your injury.  You have numbness, tingling, or weakness in any body part that is below the level of your injury.  You cannot control your bladder or bowels.   This information is not intended to replace advice given to you by your health care provider. Make sure you discuss any questions you have with your health care provider.   Document Released: 01/21/2005 Document Revised: 04/30/2015 Document Reviewed: 12/19/2014 Elsevier Interactive Patient Education [employer].

## 2016-10-17 LAB — BASIC METABOLIC PANEL
ANION GAP: 6 (ref 5–15)
BUN: 16 mg/dL (ref 6–20)
CO2: 36 mmol/L — AB (ref 22–32)
Calcium: 8.8 mg/dL — ABNORMAL LOW (ref 8.9–10.3)
Chloride: 96 mmol/L — ABNORMAL LOW (ref 101–111)
Creatinine, Ser: 0.65 mg/dL (ref 0.44–1.00)
GFR calc Af Amer: >60 mL/min (ref >60)
GLUCOSE: 149 mg/dL — AB (ref 65–99)
POTASSIUM: 3.9 mmol/L (ref 3.5–5.1)
Sodium: 138 mmol/L (ref 135–145)

## 2016-10-17 LAB — CBC
HEMATOCRIT: 38.6 % (ref 36.0–46.0)
HEMOGLOBIN: 11.3 g/dL — AB (ref 12.0–15.0)
MCH: 25.9 pg — AB (ref 26.0–34.0)
MCHC: 29.3 g/dL — AB (ref 30.0–36.0)
MCV: 88.5 fL (ref 78.0–100.0)
Platelets: 447 10*3/uL — ABNORMAL HIGH (ref 150–400)
RBC: 4.36 MIL/uL (ref 3.87–5.11)
RDW: 14 % (ref 11.5–15.5)
WBC: 8.7 10*3/uL (ref 4.0–10.5)

## 2016-10-17 NOTE — Care Management Note (Addendum)
Case Management Note  Patient Details  Name: [name] MRN: [id number] Date of Birth: November 02, 1936  Subjective/Objective:    S/p LI KP                Action/Plan: Discharge Planning: AVS reviewed: NCM spoke to pt and states she lives at home with her son. HH was arranged with [facility]. Faxed orders, dc summary and facesheet to [facility] # [phone]. PTAR arranged. Faxed dc summary to Dr. Arelia Sneddon office requesting [facility].   PCP [name] MD    Expected Discharge Date:  10/17/2016              Expected Discharge Plan:  [place]  In-House Referral:  Clinical Social Work  Discharge planning Services  CM Consult  Post Acute Care Choice:  Home Health Choice offered to:  Patient  DME Arranged:  N/A DME Agency:  NA  HH Arranged:  PT HH Agency:  [facility]  Status of Service:  Completed, signed off  If discussed at [place] of Stay Meetings, dates discussed:    Additional Comments:  Erenest Rasher, RN 10/17/2016, 1:05 PM

## 2016-10-17 NOTE — Progress Notes (Signed)
Occupational Therapy Treatment Patient Details Name: [name] MRN: [id number] DOB: 11-21-1936 Today's Date: 10/17/2016    History of present illness 80 y/o female admitted from home s/p fall with c/o back pain.  X-ray report 25% L2 comression fx.  Pt wtih hx of COPD, chronic back pain, osteoporosis, home O2 dependency and, per pt, "several kyphoplasties.   OT comments  Patient limited by pain this session. Still requires hands on assistance with all mobility and ADLs. Would not be safe to discharge home without 24/7 A. OT will continue to follow.   Follow Up Recommendations  Supervision/Assistance - 24 hour;Home health OT (recommend SNF if no 24/7 A available)   Equipment Recommendations  None recommended by OT    Recommendations for Other Services      Precautions / Restrictions Precautions Precautions: Back;Fall Precaution Comments: reviewed back precautions with pt Restrictions Weight Bearing Restrictions: No       Mobility Bed Mobility               General bed mobility comments: sitting EOB upon arrival  Transfers Overall transfer level: Needs assistance Equipment used: Rolling walker (2 wheeled) Transfers: Sit to/from [facility] Sit to Stand: Min assist;From elevated surface Stand pivot transfers: Min assist       General transfer comment: cues for correct hand placement and safe technique during stand - sit transitions    Balance                                   ADL Overall ADL's : Needs assistance/impaired Eating/Feeding: Set up;Sitting   Grooming: Set up;Sitting                   Toilet Transfer: Minimal assistance;Stand-pivot;Cueing for safety;BSC;RW   Toileting- Clothing Manipulation and Hygiene: Moderate assistance       Functional mobility during ADLs: Minimal assistance;Rolling walker        Vision                     Perception     Praxis      Cognition   Behavior  During Therapy: WFL for tasks assessed/performed Overall Cognitive Status: Within Functional Limits for tasks assessed                       Extremity/Trunk Assessment               Exercises     Shoulder Instructions       General Comments      Pertinent Vitals/ Pain       Pain Assessment: 0-10 Pain Score: 10-Worst pain ever Pain Location: back, legs Pain Descriptors / Indicators: Aching;Constant;Burning;Sore Pain Intervention(s): Limited activity within patient's tolerance;Monitored during session;Patient requesting pain meds-RN notified;Repositioned  Home Living                                          Prior Functioning/Environment              Frequency  Min 2X/week        Progress Toward Goals  OT Goals(current goals can now be found in the care plan section)  Progress towards OT goals: Progressing toward goals     Plan Discharge plan remains appropriate    Co-evaluation  End of Session Equipment Utilized During Treatment: Rolling walker;Oxygen   Activity Tolerance Patient limited by pain   Patient Left in chair;with call bell/phone within reach;with chair alarm set   Nurse Communication Patient requests pain meds;Mobility status        Time: [id number] OT Time Calculation (min): 12 min  Charges: OT General Charges $OT Visit: 1 Procedure OT Treatments $Self Care/Home Management : 8-22 mins  Asyia Hornung A 10/17/2016, 8:43 AM

## 2016-10-17 NOTE — Progress Notes (Signed)
Discharged from floor via EMS stretcher for transport home. Belongings with pt, family member waiting at home. No changes in assessment. Teniola Tseng, [employer]

## 2016-10-17 NOTE — Progress Notes (Signed)
RNCM assisted with Transportation home.

## 2016-10-17 NOTE — Discharge Summary (Signed)
[facility]  Physician Discharge Summary   Patient ID: RALPHINE CAMMER MRN: [id number] DOB/AGE: 01-26-1936 80 y.o.  Admit date: 10/10/2016 Discharge date: 10/17/2016  PCP: [name], MD  DISCHARGE DIAGNOSES:  Active Problems:   Hypertension   COPD (chronic obstructive pulmonary disease) (HCC)   Chronic respiratory failure (HCC)   Back pain   L2 vertebral fracture (HCC)   Intractable pain   Acute cystitis without hematuria   Pain management   Closed compression fracture of L1 lumbar vertebra (HCC)   RECOMMENDATIONS FOR OUTPATIENT FOLLOW UP: 1. Outpatient follow-up with Dr. Estanislado Pandy if her pain does not improve in 7-10 days 2. Needs outpatient blood pressure monitoring   DISCHARGE CONDITION: fair  Diet recommendation: As before  Filed Weights   10/10/16 1930  Weight: 62.6 kg (138 lb)    INITIAL HISTORY: 80 year old Caucasian female with a past medical history of COPD, chronic respiratory failure on home oxygen, hypertension, osteoporosis, presented after she had a mechanical fall resulting in lower back pain. Imaging studies revealed L1 fracture. She did not have any neurological deficits. She was hospitalized for pain control.  Consultants: Interventional radiology  Procedures: Kyphoplasty on 10/19  HOSPITAL COURSE:   Severe lower back due to L1 vertebral fracture after the fall Patient was admitted to the hospital. She was given steroids along with narcotic pain medications and Robaxin. Pain did not improve. She was subsequently seen by interventional radiology. She underwent kyphoplasty. Continue to have significant pain yesterday, so she was kept in the hospital an additional day. Pain appears to be improved today. Still upset that pain hasn't completely resolved. Told her that this may take up to 2 weeks for resolution. She does not have any neurological deficits. Patient seen by physical and occupational therapy. Skilled nursing facility was  offered, however, patient adamantly declines. She wants to go back home with her son. Home health will be ordered. Okay for discharge home today.   Questionable history of Essential Hypertension Noted to be on any medications at home for the same. Blood pressure has been high due to pain. Needs outpatient monitoring.   Acute cystitis without hematuria Has had acute urinary retention required a straight cath. Patient reported dysuria and urgency. UA was abnormal. Patient started on IV ceftriaxone. Urine culture showed multiple species. Repeat sample, also did not show any growth. Changed to Vantin.   History of COPD (chronic obstructive pulmonary disease) with chronic hypoxic respiratory failure on home oxygen Stable. Patient is currently on 2-3 L of oxygen. She has no respiratory symptoms.  Normocytic anemia Hemoglobin around baseline.  Overall, stable. Okay for discharge home today.   PERTINENT LABS:  The results of significant diagnostics from this hospitalization (including imaging, microbiology, ancillary and laboratory) are listed below for reference.    Microbiology: Recent Results (from the past 240 hour(s))  Culture, Urine     Status: Abnormal   Collection Time: 10/13/16  7:50 AM  Result Value Ref Range Status   Specimen Description URINE, CATHETERIZED  Final   Special Requests NONE  Final   Culture MULTIPLE SPECIES PRESENT, SUGGEST RECOLLECTION (A)  Final   Report Status 10/14/2016 FINAL  Final  Urine culture     Status: Abnormal   Collection Time: 10/14/16 11:50 AM  Result Value Ref Range Status   Specimen Description URINE, CLEAN CATCH  Final   Special Requests NONE  Final   Culture (A)  Final    <10,000 COLONIES/mL INSIGNIFICANT GROWTH Performed at [facility]  Report Status 10/15/2016 FINAL  Final     Labs: Basic Metabolic Panel:  Recent Labs Lab 10/11/16 0423 10/15/16 0428 10/16/16 0502 10/17/16 0523  NA 137 136 137 138  K 3.7 3.9 3.8  3.9  CL 96* 92* 96* 96*  CO2 33* 33* 34* 36*  GLUCOSE 162* 116* 134* 149*  BUN 11 23* 23* 16  CREATININE 0.60 0.66 0.60 0.65  CALCIUM 8.6* 9.0 8.5* 8.8*   CBC:  Recent Labs Lab 10/11/16 0423 10/15/16 0428 10/16/16 0502 10/17/16 0523  WBC 10.2 9.0 7.6 8.7  NEUTROABS  --  6.5  --   --   HGB 10.8* 11.2* 10.6* 11.3*  HCT 35.8* 37.5 35.3* 38.6  MCV 85.6 86.6 85.7 88.5  PLT 353 469* 433* 447*     IMAGING STUDIES Dg Thoracic Spine 2 View  Result Date: 10/10/2016 CLINICAL DATA:  Tripped over oxygen tubing now with generalized back pain, worse regional to T12. EXAM: THORACIC SPINE 2 VIEWS COMPARISON:  Chest radiograph- 12/19/2015 ; lumbar spine radiographs - earlier same day CT abdomen pelvis - 03/19/2014 FINDINGS: There is a very mild scoliotic curvature of the thoracolumbar spine, presumably degenerative in etiology. No definite anterolisthesis or retrolisthesis. Post cement augmentation of the T12 vertebral body with cement seen within the adjacent lumbar veins, unchanged compared to remote CT the abdomen pelvis performed 03/19/2014. Remaining thoracic and lumbar vertebral body heights appear preserved. Thoracic intervertebral disc space heights appear preserved. Limited visualization adjacent thorax demonstrates atherosclerotic plaque within the thoracic aorta. Regional soft tissues appear normal. IMPRESSION: 1. No acute findings. 2. Post cement augmentation of T12. 3.  Aortic Atherosclerosis (ICD10-170.0) Electronically Signed   By: Sandi Mariscal M.D.   On: 10/10/2016 12:05   Dg Lumbar Spine Complete  Result Date: 10/10/2016 CLINICAL DATA:  Patient tripped over oxygen tubing now with generalized back pain. EXAM: LUMBAR SPINE - COMPLETE 4+ VIEW COMPARISON:  Thoracic spine radiographs - earlier same day ; CT abdomen pelvis - 03/19/2014 FINDINGS: There are 6 non rib-bearing lumbar type vertebral bodies. For the purposes of this dictation, lumbar levels be labeled L1 through L6. There is  minimal (approximately 6 mm) of retrolisthesis of L4 upon L5, unchanged. Otherwise, normal alignment of the lumbar spine. Stable sequela of a T12, L4 and bilateral sacroplasty without interval change. Cement is again seen within draining lumbar veins at both the T12 and L4 vertebral bodies. Interval development of a mild (approximately 25%) compression deformity involving the superior endplate of the L2 vertebral body with approximately 5 mm of retropulsion at this level. Remaining lumbar vertebral body heights appear preserved. Mild to moderate multilevel lumbar spine DDD, worse at L3-L4 with disc space height loss, endplate irregularity and sclerosis. Limited visualization the bilateral SI joints is normal. Multiple phleboliths overlie the lower pelvis bilaterally, left greater than right. Calcified atherosclerotic plaque within the abdominal aorta. Regional bowel gas pattern is normal. IMPRESSION: 1. Transitional anatomy with spinal labeling as above. 2. Interval development of a presumably acute mild (approximately 25%) compression deformity involving the superior endplate of the L2 vertebral body with minimal (approximately 5 mm) of retropulsion at this level. Correlation for point tenderness at this location is recommended. Further evaluation lumbar spine MRI could be performed as clinically indicated. 3. Stable sequela of cement augmentation of T12, L4 and bilateral sacroplasty. 4. Mild-to-moderate multilevel lumbar spine DDD, worse at L3-L4. Electronically Signed   By: Sandi Mariscal M.D.   On: 10/10/2016 12:10   Mr Lumbar Spine W Wo Contrast  Result Date: 10/12/2016 CLINICAL DATA:  Back pain. Recent fall. History of osteoporosis and fractures. EXAM: MRI LUMBAR SPINE WITHOUT AND WITH CONTRAST TECHNIQUE: Multiplanar and multiecho pulse sequences of the lumbar spine were obtained without and with intravenous contrast. CONTRAST:  32mL MULTIHANCE GADOBENATE DIMEGLUMINE 529 MG/ML IV SOLN COMPARISON:  Lumbar MRI  05/04/2013.  Lumbar radiographs 10/10/2016 FINDINGS: Segmentation:  Normal Alignment: Mild anterior slip L2-3. Mild retrolisthesis L3-4. This is unchanged from prior studies. Vertebrae: Chronic compression fracture T11 with cement augmentation unchanged Moderate compression fracture of L1 appears acute or subacute. This appears to be a burst fracture with fracture the posterior cortex of the vertebral body. There is diffuse bone marrow edema and enhancement in the bone marrow. There is fluid within the vertebral body. There is a small amount of ventral epidural fluid bilaterally. There is retropulsion of bone into the canal with mild spinal stenosis. This appears to be a benign fracture. Chronic compression fracture L3 with cement vertebral augmentation unchanged from the prior MRI. Conus medullaris: Extends to the T12-L1 level and appears normal. Paraspinal and other soft tissues: Paraspinous muscles intact. No retroperitoneal mass. Disc levels: T12-L1: Mild disc degeneration. Mild spinal stenosis due to retropulsion of L1 into the canal. L1-2:  Negative L2-3: Mild spinal stenosis due to endplate spurring. Mild facet degeneration L3-4:  Disc bulging and spurring without significant spinal stenosis L4-5: Mild disc degeneration. Moderate facet hypertrophy causing mild spinal stenosis. L5-S1:  Mild facet degeneration without stenosis. IMPRESSION: Acute or subacute burst fracture of L1 causing mild spinal stenosis due to retropulsion of bone into the canal. In addition, ventral epidural fluid, likely blood, it is present contributing to spinal stenosis. Chronic compression fractures T11 and L3 Mild spinal stenosis L4-5. Electronically Signed   By: Franchot Gallo M.D.   On: 10/12/2016 17:37    DISCHARGE EXAMINATION: Vitals:   10/16/16 1326 10/16/16 2200 10/17/16 0326 10/17/16 0600  BP: (!) 144/60 133/62 (!) 144/69 (!) 172/79  Pulse: 93 85 77 82  Resp: 16 16 14 16   Temp: 97.6 F (36.4 C) 98.4 F (36.9 C) 97.4  F (36.3 C) 97.8 F (36.6 C)  TempSrc: Oral Oral Oral Oral  SpO2: 94% 93% 95% 98%  Weight:      Height:       General appearance: alert, cooperative, appears stated age and no distress Resp: clear to auscultation bilaterally Cardio: regular rate and rhythm, S1, S2 normal, no murmur, click, rub or gallop GI: soft, non-tender; bowel sounds normal; no masses,  no organomegaly Extremities: extremities normal, atraumatic, no cyanosis or edema  DISPOSITION: Home with home health  Discharge Instructions    Call MD for:  difficulty breathing, headache or visual disturbances    Complete by:  As directed    Call MD for:  extreme fatigue    Complete by:  As directed    Call MD for:  persistant dizziness or light-headedness    Complete by:  As directed    Call MD for:  persistant nausea and vomiting    Complete by:  As directed    Call MD for:  severe uncontrolled pain    Complete by:  As directed    Call MD for:  temperature >100.4    Complete by:  As directed    Diet general    Complete by:  As directed    Discharge instructions    Complete by:  As directed    Please be sure to follow-up with either your primary care  provider or with Dr. Estanislado Pandy for persistent back pain. Blood pressure is high, most likely due to pain. This will need outpatient check.  You were cared for by a hospitalist during your hospital stay. If you have any questions about your discharge medications or the care you received while you were in the hospital after you are discharged, you can call the unit and asked to speak with the hospitalist on call if the hospitalist that took care of you is not available. Once you are discharged, your primary care physician will handle any further medical issues. Please note that NO REFILLS for any discharge medications will be authorized once you are discharged, as it is imperative that you return to your primary care physician (or establish a relationship with a primary care  physician if you do not have one) for your aftercare needs so that they can reassess your need for medications and monitor your lab values. If you do not have a primary care physician, you can call [phone] for a physician referral.   Increase activity slowly    Complete by:  As directed       ALLERGIES:  Allergies  Allergen Reactions  . Ambien [Zolpidem] Other (See Comments)    Hallucinations and paranoid     Current Discharge Medication List    START taking these medications   Details  cefpodoxime (VANTIN) 200 MG tablet Take 1 tablet (200 mg total) by mouth every 12 (twelve) hours. For 5 more days Qty: 10 tablet, Refills: 0    DULoxetine (CYMBALTA) 60 MG capsule Take 1 capsule (60 mg total) by mouth daily. Qty: 30 capsule, Refills: 1    methocarbamol (ROBAXIN) 500 MG tablet Take 1 tablet (500 mg total) by mouth 4 (four) times daily. Qty: 30 tablet, Refills: 0    oxyCODONE-acetaminophen (PERCOCET) 7.5-325 MG tablet Take 1-2 tablets by mouth every 4 (four) hours as needed for moderate pain. Qty: 30 tablet, Refills: 0    polyethylene glycol (MIRALAX / GLYCOLAX) packet Take 17 g by mouth daily as needed for mild constipation. Qty: 14 each, Refills: 0    senna (SENOKOT) 8.6 MG TABS tablet Take 1 tablet (8.6 mg total) by mouth 2 (two) times daily. Qty: 120 each, Refills: 0      STOP taking these medications     ALPRAZolam (XANAX) 0.25 MG tablet          Follow-up Information    [facility] .   Specialty:  Home Health Services Contact information: [address] [phone]        [name], MD. Schedule an appointment as soon as possible for a visit in 1 week(s).   Specialty:  Family Medicine Contact information: [place] [address] [phone]        Rob Hickman, MD .   Specialty:  Interventional Radiology Why:  call if pain hasn't improved in 7 days. Contact  information: [address] [place] [address] [phone]           TOTAL DISCHARGE TIME: 35 minutes  [facility]  [facility] Pager [phone]  10/17/2016, 1:14 PM

## 2016-10-17 NOTE — Care Management Important Message (Signed)
Important Message  Patient Details  Name: [name] MRN: [id number] Date of Birth: 1936/09/11   Medicare Important Message Given:  Yes    Erenest Rasher, RN 10/17/2016, 1:05 PM

## 2016-10-17 NOTE — Progress Notes (Signed)
Physical Therapy Treatment Patient Details Name: [name] MRN: [id number] DOB: 09-18-1936 Today's Date: 10/17/2016    History of Present Illness 80 y/o female admitted from home s/p fall with c/o back pain.  X-ray report 25% L2 comression fx.  Pt wtih hx of COPD, chronic back pain, osteoporosis, home O2 dependency and, per pt, "several kyphoplasties.    PT Comments    Pt progressing slowly; she adamantly refuses SNF; recommend assist/supervision for transfers and amb/any OOB activity  Follow Up Recommendations  SNF;Home health PT (refuses SNF)     Equipment Recommendations  None recommended by PT    Recommendations for Other Services       Precautions / Restrictions Precautions Precautions: Back;Fall Restrictions Weight Bearing Restrictions: No    Mobility  Bed Mobility Overal bed mobility: Needs Assistance Bed Mobility: Rolling;Sidelying to Sit Rolling: Supervision Sidelying to sit: Supervision       General bed mobility comments: incr time, effortful but no physical assist  Transfers Overall transfer level: Needs assistance Equipment used: Rolling walker (2 wheeled) Transfers: Sit to/from Stand Sit to Stand: Min assist;From elevated surface         General transfer comment: cues for correct hand placement and safe technique   Ambulation/Gait Ambulation/Gait assistance: Min assist;Min guard Ambulation Distance (Feet): 4 Feet Assistive device: Rolling walker (2 wheeled) Gait Pattern/deviations: Step-to pattern;Decreased step length - right;Decreased step length - left;Narrow base of support;Trunk flexed     General Gait Details: cues for posture, RW position, moves slowly but no overt LOB--pivotal steps to chair   Stairs            Wheelchair Mobility    Modified Rankin (Stroke Patients Only)       Balance                                    Cognition Arousal/Alertness: Awake/alert Behavior During Therapy: WFL for  tasks assessed/performed Overall Cognitive Status: Within Functional Limits for tasks assessed                      Exercises      General Comments        Pertinent Vitals/Pain Pain Assessment: 0-10 Pain Score: 5  Pain Location: back, legs Pain Descriptors / Indicators: Burning;Grimacing Pain Intervention(s): Limited activity within patient's tolerance;Monitored during session;Premedicated before session;Repositioned    Home Living                      Prior Function            PT Goals (current goals can now be found in the care plan section) Acute Rehab PT Goals Patient Stated Goal: less pain PT Goal Formulation: With patient Time For Goal Achievement: 10/24/16 Potential to Achieve Goals: Fair Progress towards PT goals: Progressing toward goals    Frequency    Min 3X/week      PT Plan Current plan remains appropriate    Co-evaluation             End of Session Equipment Utilized During Treatment: Gait belt Activity Tolerance: Patient tolerated treatment well Patient left: in chair;with call bell/phone within reach;with chair alarm set     Time: [id number] PT Time Calculation (min) (ACUTE ONLY): 20 min  Charges:  $Therapeutic Activity: 8-22 mins  G CodesKenyon Ana 10/17/2016, 1:35 PM

## 2016-10-20 ENCOUNTER — Telehealth (HOSPITAL_COMMUNITY): Payer: Self-pay

## 2016-10-20 NOTE — Telephone Encounter (Signed)
Daughter stated that pt was still have some pain after procedure. Will talk with pt and call back to let us know if f/u is needed. AW

## 2016-10-26 ENCOUNTER — Encounter (HOSPITAL_COMMUNITY): Payer: Self-pay | Admitting: Interventional Radiology

## 2016-12-10 ENCOUNTER — Other Ambulatory Visit (HOSPITAL_COMMUNITY): Payer: Self-pay | Admitting: Interventional Radiology

## 2016-12-10 DIAGNOSIS — IMO0001 Reserved for inherently not codable concepts without codable children: Secondary | ICD-10-CM

## 2016-12-10 DIAGNOSIS — M4850XS Collapsed vertebra, not elsewhere classified, site unspecified, sequela of fracture: Principal | ICD-10-CM

## 2016-12-14 ENCOUNTER — Telehealth (HOSPITAL_COMMUNITY): Payer: Self-pay | Admitting: Radiology

## 2016-12-15 ENCOUNTER — Ambulatory Visit (HOSPITAL_COMMUNITY): Admission: RE | Admit: 2016-12-15 | Payer: Medicare Other | Source: Ambulatory Visit

## 2018-06-09 ENCOUNTER — Encounter (HOSPITAL_COMMUNITY): Payer: Self-pay | Admitting: Emergency Medicine

## 2018-06-09 ENCOUNTER — Emergency Department (HOSPITAL_COMMUNITY): Payer: Medicare Other

## 2018-06-09 ENCOUNTER — Emergency Department (HOSPITAL_COMMUNITY)
Admission: EM | Admit: 2018-06-09 | Discharge: 2018-06-09 | Disposition: A | Discharge status: Home / Self Care | Payer: Medicare Other | Attending: Emergency Medicine | Admitting: Emergency Medicine

## 2018-06-09 DIAGNOSIS — Z87891 Personal history of nicotine dependence: Secondary | ICD-10-CM | POA: Diagnosis not present

## 2018-06-09 DIAGNOSIS — I10 Essential (primary) hypertension: Secondary | ICD-10-CM | POA: Diagnosis not present

## 2018-06-09 DIAGNOSIS — R0602 Shortness of breath: Secondary | ICD-10-CM | POA: Insufficient documentation

## 2018-06-09 DIAGNOSIS — R4182 Altered mental status, unspecified: Secondary | ICD-10-CM | POA: Insufficient documentation

## 2018-06-09 DIAGNOSIS — J449 Chronic obstructive pulmonary disease, unspecified: Secondary | ICD-10-CM | POA: Diagnosis not present

## 2018-06-09 DIAGNOSIS — F418 Other specified anxiety disorders: Secondary | ICD-10-CM

## 2018-06-09 DIAGNOSIS — Z79899 Other long term (current) drug therapy: Secondary | ICD-10-CM | POA: Diagnosis not present

## 2018-06-09 DIAGNOSIS — F329 Major depressive disorder, single episode, unspecified: Secondary | ICD-10-CM | POA: Diagnosis not present

## 2018-06-09 DIAGNOSIS — F419 Anxiety disorder, unspecified: Secondary | ICD-10-CM | POA: Insufficient documentation

## 2018-06-09 DIAGNOSIS — J441 Chronic obstructive pulmonary disease with (acute) exacerbation: Secondary | ICD-10-CM

## 2018-06-09 LAB — CBC WITH DIFFERENTIAL/PLATELET
BASOS PCT: 0 %
Basophils Absolute: 0 10*3/uL (ref 0.0–0.1)
EOS ABS: 0.2 10*3/uL (ref 0.0–0.7)
EOS PCT: 2 %
HCT: 38.7 % (ref 36.0–46.0)
Hemoglobin: 11.7 g/dL — ABNORMAL LOW (ref 12.0–15.0)
LYMPHS PCT: 20 %
Lymphs Abs: 1.2 10*3/uL (ref 0.7–4.0)
MCH: 27.5 pg (ref 26.0–34.0)
MCHC: 30.2 g/dL (ref 30.0–36.0)
MCV: 91.1 fL (ref 78.0–100.0)
Monocytes Absolute: 0.3 10*3/uL (ref 0.1–1.0)
Monocytes Relative: 6 %
Neutro Abs: 4.5 10*3/uL (ref 1.7–7.7)
Neutrophils Relative %: 72 %
PLATELETS: 366 10*3/uL (ref 150–400)
RBC: 4.25 MIL/uL (ref 3.87–5.11)
RDW: 13.4 % (ref 11.5–15.5)
WBC: 6.2 10*3/uL (ref 4.0–10.5)

## 2018-06-09 LAB — COMPREHENSIVE METABOLIC PANEL
ALBUMIN: 3.8 g/dL (ref 3.5–5.0)
ALT: 12 U/L — AB (ref 14–54)
AST: 17 U/L (ref 15–41)
Alkaline Phosphatase: 65 U/L (ref 38–126)
Anion gap: 8 (ref 5–15)
BUN: 11 mg/dL (ref 6–20)
CHLORIDE: 100 mmol/L — AB (ref 101–111)
CO2: 37 mmol/L — AB (ref 22–32)
CREATININE: 0.62 mg/dL (ref 0.44–1.00)
Calcium: 8.9 mg/dL (ref 8.9–10.3)
GFR calc Af Amer: >60 mL/min (ref >60)
GFR calc non Af Amer: >60 mL/min (ref >60)
Glucose, Bld: 121 mg/dL — ABNORMAL HIGH (ref 65–99)
Potassium: 3.7 mmol/L (ref 3.5–5.1)
SODIUM: 145 mmol/L (ref 135–145)
Total Bilirubin: 0.5 mg/dL (ref 0.3–1.2)
Total Protein: 7.2 g/dL (ref 6.5–8.1)

## 2018-06-09 LAB — TROPONIN I: Troponin I: <0.03 ng/mL (ref <0.03)

## 2018-06-09 LAB — BRAIN NATRIURETIC PEPTIDE: B Natriuretic Peptide: 33.4 pg/mL (ref 0.0–100.0)

## 2018-06-09 MED ORDER — OXYCODONE-ACETAMINOPHEN 5-325 MG PO TABS: 1.0000 | ORAL_TABLET | Freq: Four times a day (QID) | ORAL | 0 refills | Status: DC | PRN

## 2018-06-09 MED ORDER — AEROCHAMBER PLUS FLO-VU MEDIUM MISC
1.0000 | Freq: Once | Status: AC
  Administered 2018-06-09: 1
  Filled 2018-06-09: qty 1

## 2018-06-09 MED ORDER — ALBUTEROL SULFATE (2.5 MG/3ML) 0.083% IN NEBU
5.0000 mg | INHALATION_SOLUTION | Freq: Once | RESPIRATORY_TRACT | Status: DC
  Filled 2018-06-09: qty 6

## 2018-06-09 MED ORDER — DULOXETINE HCL 30 MG PO CPEP: 30.0000 mg | ORAL_CAPSULE | Freq: Every day | ORAL | 0 refills | Status: DC

## 2018-06-09 MED ORDER — AMLODIPINE BESYLATE 2.5 MG PO TABS: 2.5000 mg | ORAL_TABLET | Freq: Every day | ORAL | 0 refills | Status: DC

## 2018-06-09 MED ORDER — PREDNISONE 20 MG PO TABS: 40.0000 mg | ORAL_TABLET | Freq: Every day | ORAL | 0 refills | Status: AC

## 2018-06-09 MED ORDER — ALBUTEROL SULFATE HFA 108 (90 BASE) MCG/ACT IN AERS
2.0000 | INHALATION_SPRAY | Freq: Once | RESPIRATORY_TRACT | Status: AC
  Administered 2018-06-09: 2 via RESPIRATORY_TRACT
  Filled 2018-06-09: qty 6.7

## 2018-06-09 MED ORDER — IPRATROPIUM-ALBUTEROL 0.5-2.5 (3) MG/3ML IN SOLN
3.0000 mL | Freq: Once | RESPIRATORY_TRACT | Status: AC
  Administered 2018-06-09: 3 mL via RESPIRATORY_TRACT
  Filled 2018-06-09: qty 3

## 2018-06-09 MED ORDER — ALBUTEROL SULFATE HFA 108 (90 BASE) MCG/ACT IN AERS: 1.0000 | INHALATION_SPRAY | RESPIRATORY_TRACT | 0 refills | Status: DC | PRN

## 2018-06-09 MED ORDER — METHOCARBAMOL 500 MG PO TABS: 500.0000 mg | ORAL_TABLET | Freq: Three times a day (TID) | ORAL | 0 refills | Status: DC | PRN

## 2018-06-09 MED ORDER — PREDNISONE 20 MG PO TABS
60.0000 mg | ORAL_TABLET | Freq: Once | ORAL | Status: AC
  Administered 2018-06-09: 60 mg via ORAL
  Filled 2018-06-09: qty 3

## 2018-06-09 MED ORDER — DOCUSATE SODIUM 250 MG PO CAPS
250.0000 mg | ORAL_CAPSULE | Freq: Every day | ORAL | 0 refills | Status: DC
Start: 2018-06-09 — End: 2019-06-20

## 2018-06-09 NOTE — ED Notes (Signed)
Patient out of ED via [facility]. Prescriptions and discharge paperwork reviewed with patient. Patient out of ED in no distress.

## 2018-06-09 NOTE — ED Triage Notes (Signed)
Pt comes to ed via em[facility], c/o sob. V/s 180/81, rr 18, spo2 100 3 liters, hr 72 reg. Sob started last nightt.   hx of copd. Hx of htn, ran out of bp meds.

## 2018-06-09 NOTE — Discharge Instructions (Addendum)
I think that many of your symptoms are due to being off your medications.  For your breathing, use the ALBUTEROL INHALER every 4 to 6 hours as needed for wheezing. Start taking the PREDNISONE (a steroid) as prescribed.  For your depression, we'll restart the cymbalta at a lower dose. Be aware that when taking this again, it does put you at a slight increase risk of worsening depression/suicidal thoughts, so follow-up with your doctor.  Take the pain medications only as prescribed.

## 2018-06-09 NOTE — ED Provider Notes (Signed)
[facility] DEPT Provider Note   CSN: [id number] Arrival date & time: 06/09/18  0706     History   Chief Complaint Chief Complaint  Patient presents with  . Shortness of Breath    HPI [name] is a 82 y.o. female.  HPI   82 yo F with PMHx COPD, HTN, chronic O2 requirement here with multiple complaints. Primary complaint is SOB x several weeks. Pt reports she stopped taken her inhalers and meds after her son passed away 1 year ago - she was reporteldy depressed then but is now "doing better," denies any HI, SI, AVH. She reports persistent fatigue, SOB x 1 week with worsening cough. She does not move much but does report she has had worsening dyspnea at rest as well. No LE swelling. She also reports intermittent HA and states she has not been taking her BP meds. She called EMS for eval and SBP 200/110s, so she was told to come. Denies any CP. No LE swelling. No known weight changes. No other med complaints.   Past Medical History:  Diagnosis Date  . Chronic back pain   . Colitis 03/19/2014  . COPD (chronic obstructive pulmonary disease) ([place])   . Hypertension   . On home oxygen therapy 06-14-13   continuos 2.5l/m nasally-24/7  . Osteoporosis   . Oxygen dependent     Patient Active Problem List   Diagnosis Date Noted  . Closed compression fracture of L1 lumbar vertebra   . Acute cystitis without hematuria   . Pain management   . Intractable pain   . Back pain 10/10/2016  . L2 vertebral fracture ([place]) 10/10/2016  . Acute encephalopathy 12/25/2015  . Drug-induced delirium ([place]) 12/25/2015  . Acute on chronic respiratory failure ([place]) 12/25/2015  . CAP (community acquired pneumonia) 12/19/2015  . Chronic respiratory failure ([place]) 03/19/2014  . Depression 03/19/2014  . Aspiration pneumonia ([place]) 03/10/2014  . Acute respiratory failure with hypoxia ([place]) 03/10/2014  . Sepsis ([place]) 03/10/2014  . Hypokalemia 03/10/2014  . Hypertension  03/10/2014  . COPD (chronic obstructive pulmonary disease) ([place]) 03/10/2014  . Tobacco abuse 03/10/2014  . Anemia 06/15/2013  . Generalized abdominal pain 06/15/2013  . Blood in the stool 06/15/2013    Past Surgical History:  Procedure Laterality Date  . ABDOMINAL HYSTERECTOMY    . ESOPHAGOGASTRODUODENOSCOPY N/A 06/15/2013   Procedure: ESOPHAGOGASTRODUODENOSCOPY (EGD);  Surgeon: [name], MD;  Location: [facility];  Service: Endoscopy;  Laterality: N/A;  . IR GENERIC HISTORICAL  10/15/2016   IR KYPHO LUMBAR INC FX REDUCE BONE BX UNI/BIL CANNULATION INC/IMAGING 10/15/2016 Luanne Bras, MD [facility]  . sacroplasty  06-14-13   05-10-13 IVR [facility] for fracture stabilization     OB History   None      Home Medications    Prior to Admission medications   Medication Sig Start Date End Date Taking? Authorizing Provider  albuterol (PROVENTIL HFA;VENTOLIN HFA) 108 (90 Base) MCG/ACT inhaler Inhale 1-2 puffs into the lungs every 4 (four) hours as needed for wheezing or shortness of breath. 06/09/18   Duffy Bruce, MD  cefpodoxime (VANTIN) 200 MG tablet Take 1 tablet (200 mg total) by mouth every 12 (twelve) hours. For 5 more days 10/16/16   Bonnielee Haff, MD  docusate sodium (COLACE) 250 MG capsule Take 1 capsule (250 mg total) by mouth daily. 06/09/18   Duffy Bruce, MD  DULoxetine (CYMBALTA) 30 MG capsule Take 1 capsule (30 mg total) by mouth daily for 14 days.  06/09/18 06/23/18  Duffy Bruce, MD  methocarbamol (ROBAXIN) 500 MG tablet Take 1 tablet (500 mg total) by mouth every 8 (eight) hours as needed for muscle spasms. 06/09/18   Duffy Bruce, MD  oxyCODONE-acetaminophen (PERCOCET/ROXICET) 5-325 MG tablet Take 1-2 tablets by mouth every 6 (six) hours as needed for moderate pain or severe pain. 06/09/18   Duffy Bruce, MD  polyethylene glycol [employer] / Floria Raveling) packet Take 17 g by mouth daily as needed for mild constipation. 10/16/16   Bonnielee Haff, MD    predniSONE (DELTASONE) 20 MG tablet Take 2 tablets (40 mg total) by mouth daily for 5 days. 06/09/18 06/14/18  Duffy Bruce, MD  senna (SENOKOT) 8.6 MG TABS tablet Take 1 tablet (8.6 mg total) by mouth 2 (two) times daily. 10/16/16   Bonnielee Haff, MD    Family History History reviewed. No pertinent family history.  Social History Social History   Tobacco Use  . Smoking status: Former Smoker    Types: Cigarettes    Last attempt to quit: 06/15/2011    Years since quitting: 6.9  . Smokeless tobacco: Never Used  Substance Use Topics  . Alcohol use: No  . Drug use: No     Allergies   Ambien [zolpidem]   Review of Systems Review of Systems  Constitutional: Positive for fatigue.  Respiratory: Positive for cough, shortness of breath and wheezing.   Neurological: Positive for weakness and headaches.  All other systems reviewed and are negative.    Physical Exam Updated Vital Signs BP (!) 168/72 (BP Location: Left Arm)   Pulse 82   Temp (!) 97.5 F (36.4 C) (Oral)   Resp 18   SpO2 100%   Physical Exam  Constitutional: She is oriented to person, place, and time. She appears well-developed and well-nourished. No distress.  HENT:  Head: Normocephalic and atraumatic.  Mouth/Throat: Oropharynx is clear and moist.  Eyes: Conjunctivae are normal.  Neck: Neck supple.  Cardiovascular: Normal rate, regular rhythm and normal heart sounds. Exam reveals no friction rub.  No murmur heard. Pulmonary/Chest: Effort normal. Tachypnea noted. No respiratory distress. She has decreased breath sounds. She has wheezes in the right upper field, the right middle field, the right lower field, the left upper field, the left middle field and the left lower field. She has no rhonchi. She has no rales.  Abdominal: She exhibits no distension.  Musculoskeletal: She exhibits no edema.  Neurological: She is alert and oriented to person, place, and time. She exhibits normal muscle tone.  Skin: Skin  is warm. Capillary refill takes less than 2 seconds.  Psychiatric: She has a normal mood and affect.  Nursing note and vitals reviewed.    ED Treatments / Results  Labs (all labs ordered are listed, but only abnormal results are displayed) Labs Reviewed  CBC WITH DIFFERENTIAL/PLATELET - Abnormal; Notable for the following components:      Result Value   Hemoglobin 11.7 (*)    All other components within normal limits  COMPREHENSIVE METABOLIC PANEL - Abnormal; Notable for the following components:   Chloride 100 (*)    CO2 37 (*)    Glucose, Bld 121 (*)    ALT 12 (*)    All other components within normal limits  TROPONIN I  BRAIN NATRIURETIC PEPTIDE    EKG EKG Interpretation  Date/Time:  Thursday June 09 2018 08:17:54 EDT Ventricular Rate:  69 PR Interval:    QRS Duration: 76 QT Interval:  424 QTC Calculation: 455 R Axis:  38 Text Interpretation:  Sinus rhythm No significant change since last tracing Confirmed by Duffy Bruce [phone]) on 06/09/2018 9:00:37 AM   Radiology Dg Chest 2 View  Result Date: 06/09/2018 CLINICAL DATA:  Dyspnea EXAM: CHEST - 2 VIEW COMPARISON:  12/20/2015 chest radiograph. FINDINGS: Stable cardiomediastinal silhouette with mild cardiomegaly. No pneumothorax. No pleural effusion. No pulmonary edema. No acute consolidative airspace disease. Vertebroplasty material overlies lower thoracic/upper lumbar vertebral bodies. IMPRESSION: Stable mild cardiomegaly without pulmonary edema. No active pulmonary disease. Electronically Signed   By: Ilona Sorrel M.D.   On: 06/09/2018 08:00   Ct Head Wo Contrast  Result Date: 06/09/2018 CLINICAL DATA:  Altered level of consciousness EXAM: CT HEAD WITHOUT CONTRAST TECHNIQUE: Contiguous axial images were obtained from the base of the skull through the vertex without intravenous contrast. COMPARISON:  06/04/2006 FINDINGS: Brain: Mild age related volume loss. No acute intracranial abnormality. Specifically, no  hemorrhage, hydrocephalus, mass lesion, acute infarction, or significant intracranial injury. Vascular: No hyperdense vessel or unexpected calcification. Skull: No acute calvarial abnormality. Sinuses/Orbits: Mucosal thickening in the right paranasal sinuses. Mastoid air cells are clear. Orbital soft tissues unremarkable. Other: None IMPRESSION: No acute intracranial abnormality. Chronic sinusitis. Electronically Signed   By: Rolm Baptise M.D.   On: 06/09/2018 08:47    Procedures Procedures (including critical care time)  Medications Ordered in ED Medications  ipratropium-albuterol (DUONEB) 0.5-2.5 (3) MG/3ML nebulizer solution 3 mL (3 mLs Nebulization Given 06/09/18 0807)  predniSONE (DELTASONE) tablet 60 mg (60 mg Oral Given 06/09/18 0807)  albuterol (PROVENTIL HFA;VENTOLIN HFA) 108 (90 Base) MCG/ACT inhaler 2 puff (2 puffs Inhalation Given 06/09/18 0930)  AEROCHAMBER PLUS FLO-VU MEDIUM MISC 1 each (1 each Other Given 06/09/18 0930)     Initial Impression / Assessment and Plan / ED Course  I have reviewed the triage vital signs and the nursing notes.  Pertinent labs & imaging results that were available during my care of the patient were reviewed by me and considered in my medical decision making (see chart for details).    82 yo F here with multiple chronic complaints, primary c/o SOB.  SOB: Likely 2/2 untreated COPD. On O2 only, has been off her meds. CXR is w/o PNA. EKG non-ischemic. Trop, BNP neg despite constant sx - doubt [name], ACS. No actual CP. Feels better after nebs here. Will place on steroid burst, start rescue inhaler at home, refer to PCP as she likely needs a controller med.  Pain: Chronic, not acutely changed. Likely worsened 2/2 her chronic depression which is untreated. I had a long discussion wth pt re: her depression. No SI, HI, AVH but I suspect this is a primary driver for her sx. Recently ran out of her cymbalta. Will place back on lower dose, refer to psychiatrist and  PCP.  HTN: Likely essential HTN with component of anxiety/stressors. Tx above, will hold on antiHTN agents at this time and defer to PCP give no signs of HTN urgency or emergency.   Final Clinical Impressions(s) / ED Diagnoses   Final diagnoses:  COPD exacerbation ([place])  Depression with anxiety    ED Discharge Orders        Ordered    DULoxetine (CYMBALTA) 30 MG capsule  Daily     06/09/18 1006    methocarbamol (ROBAXIN) 500 MG tablet  Every 8 hours PRN     06/09/18 1006    predniSONE (DELTASONE) 20 MG tablet  Daily     06/09/18 1006    oxyCODONE-acetaminophen (PERCOCET/ROXICET) 5-325  MG tablet  Every 6 hours PRN     06/09/18 1006    docusate sodium (COLACE) 250 MG capsule  Daily     06/09/18 1006    albuterol (PROVENTIL HFA;VENTOLIN HFA) 108 (90 Base) MCG/ACT inhaler  Every 4 hours PRN     06/09/18 1008       Duffy Bruce, MD 06/09/18 1037

## 2018-06-09 NOTE — ED Notes (Signed)
Bed: WA04 Expected date:  Expected time:  Means of arrival:  Comments: Ems-sob

## 2018-06-09 NOTE — ED Notes (Addendum)
[phone] Pandora Leiter, [name]).

## 2018-08-18 ENCOUNTER — Emergency Department (HOSPITAL_COMMUNITY): Payer: Medicare Other

## 2018-08-18 ENCOUNTER — Inpatient Hospital Stay (HOSPITAL_COMMUNITY)
Admission: EM | Admit: 2018-08-18 | Discharge: 2018-08-22 | DRG: 682 | Disposition: A | Discharge status: Home Health | Payer: Medicare Other | Attending: Internal Medicine | Admitting: Internal Medicine

## 2018-08-18 DIAGNOSIS — J431 Panlobular emphysema: Secondary | ICD-10-CM | POA: Diagnosis not present

## 2018-08-18 DIAGNOSIS — M810 Age-related osteoporosis without current pathological fracture: Secondary | ICD-10-CM | POA: Diagnosis not present

## 2018-08-18 DIAGNOSIS — E538 Deficiency of other specified B group vitamins: Secondary | ICD-10-CM | POA: Diagnosis present

## 2018-08-18 DIAGNOSIS — R410 Disorientation, unspecified: Secondary | ICD-10-CM | POA: Diagnosis not present

## 2018-08-18 DIAGNOSIS — M549 Dorsalgia, unspecified: Secondary | ICD-10-CM

## 2018-08-18 DIAGNOSIS — M48061 Spinal stenosis, lumbar region without neurogenic claudication: Secondary | ICD-10-CM | POA: Diagnosis present

## 2018-08-18 DIAGNOSIS — R4182 Altered mental status, unspecified: Secondary | ICD-10-CM | POA: Diagnosis present

## 2018-08-18 DIAGNOSIS — F0390 Unspecified dementia without behavioral disturbance: Secondary | ICD-10-CM | POA: Diagnosis not present

## 2018-08-18 DIAGNOSIS — G8929 Other chronic pain: Secondary | ICD-10-CM | POA: Diagnosis not present

## 2018-08-18 DIAGNOSIS — M4856XA Collapsed vertebra, not elsewhere classified, lumbar region, initial encounter for fracture: Secondary | ICD-10-CM | POA: Diagnosis present

## 2018-08-18 DIAGNOSIS — M545 Low back pain: Secondary | ICD-10-CM | POA: Diagnosis present

## 2018-08-18 DIAGNOSIS — M4854XA Collapsed vertebra, not elsewhere classified, thoracic region, initial encounter for fracture: Secondary | ICD-10-CM | POA: Diagnosis not present

## 2018-08-18 DIAGNOSIS — R7989 Other specified abnormal findings of blood chemistry: Secondary | ICD-10-CM | POA: Diagnosis not present

## 2018-08-18 DIAGNOSIS — Z87891 Personal history of nicotine dependence: Secondary | ICD-10-CM | POA: Diagnosis not present

## 2018-08-18 DIAGNOSIS — I10 Essential (primary) hypertension: Secondary | ICD-10-CM | POA: Diagnosis present

## 2018-08-18 DIAGNOSIS — S32010A Wedge compression fracture of first lumbar vertebra, initial encounter for closed fracture: Secondary | ICD-10-CM | POA: Diagnosis present

## 2018-08-18 DIAGNOSIS — F329 Major depressive disorder, single episode, unspecified: Secondary | ICD-10-CM | POA: Diagnosis not present

## 2018-08-18 DIAGNOSIS — G9341 Metabolic encephalopathy: Secondary | ICD-10-CM | POA: Diagnosis not present

## 2018-08-18 DIAGNOSIS — E860 Dehydration: Secondary | ICD-10-CM | POA: Diagnosis not present

## 2018-08-18 DIAGNOSIS — J449 Chronic obstructive pulmonary disease, unspecified: Secondary | ICD-10-CM | POA: Diagnosis not present

## 2018-08-18 DIAGNOSIS — N179 Acute kidney failure, unspecified: Secondary | ICD-10-CM | POA: Diagnosis not present

## 2018-08-18 DIAGNOSIS — R000 Tachycardia, unspecified: Secondary | ICD-10-CM | POA: Diagnosis not present

## 2018-08-18 DIAGNOSIS — Z8673 Personal history of transient ischemic attack (TIA), and cerebral infarction without residual deficits: Secondary | ICD-10-CM

## 2018-08-18 DIAGNOSIS — Z90710 Acquired absence of both cervix and uterus: Secondary | ICD-10-CM

## 2018-08-18 DIAGNOSIS — Z888 Allergy status to other drugs, medicaments and biological substances status: Secondary | ICD-10-CM

## 2018-08-18 DIAGNOSIS — G9340 Encephalopathy, unspecified: Secondary | ICD-10-CM | POA: Diagnosis present

## 2018-08-18 DIAGNOSIS — Z79899 Other long term (current) drug therapy: Secondary | ICD-10-CM

## 2018-08-18 DIAGNOSIS — D72829 Elevated white blood cell count, unspecified: Secondary | ICD-10-CM | POA: Diagnosis present

## 2018-08-18 DIAGNOSIS — R52 Pain, unspecified: Secondary | ICD-10-CM

## 2018-08-18 LAB — BLOOD GAS, VENOUS
ACID-BASE EXCESS: 0.5 mmol/L (ref 0.0–2.0)
Bicarbonate: 25.7 mmol/L (ref 20.0–28.0)
FIO2: 21
O2 SAT: 56 %
Patient temperature: 98.2
pCO2, Ven: 45.5 mmHg (ref 44.0–60.0)
pH, Ven: 7.368 (ref 7.250–7.430)
pO2, Ven: 31.7 mmHg — CL (ref 32.0–45.0)

## 2018-08-18 LAB — RAPID URINE DRUG SCREEN, HOSP PERFORMED
Amphetamines: NOT DETECTED
BARBITURATES: NOT DETECTED
Benzodiazepines: NOT DETECTED
Cocaine: NOT DETECTED
Opiates: NOT DETECTED
Tetrahydrocannabinol: NOT DETECTED

## 2018-08-18 LAB — CBC
HEMATOCRIT: 42.2 % (ref 36.0–46.0)
Hemoglobin: 13.3 g/dL (ref 12.0–15.0)
MCH: 27.8 pg (ref 26.0–34.0)
MCHC: 31.5 g/dL (ref 30.0–36.0)
MCV: 88.3 fL (ref 78.0–100.0)
Platelets: 478 10*3/uL — ABNORMAL HIGH (ref 150–400)
RBC: 4.78 MIL/uL (ref 3.87–5.11)
RDW: 14.2 % (ref 11.5–15.5)
WBC: 14.9 10*3/uL — ABNORMAL HIGH (ref 4.0–10.5)

## 2018-08-18 LAB — COMPREHENSIVE METABOLIC PANEL
ALK PHOS: 76 U/L (ref 38–126)
ALT: 11 U/L (ref 0–44)
ANION GAP: 16 — AB (ref 5–15)
AST: 24 U/L (ref 15–41)
Albumin: 4.6 g/dL (ref 3.5–5.0)
BUN: 36 mg/dL — ABNORMAL HIGH (ref 8–23)
CALCIUM: 9.7 mg/dL (ref 8.9–10.3)
CHLORIDE: 97 mmol/L — AB (ref 98–111)
CO2: 27 mmol/L (ref 22–32)
Creatinine, Ser: 1.31 mg/dL — ABNORMAL HIGH (ref 0.44–1.00)
GFR calc Af Amer: 44 mL/min — ABNORMAL LOW (ref >60)
GFR calc non Af Amer: 38 mL/min — ABNORMAL LOW (ref >60)
GLUCOSE: 151 mg/dL — AB (ref 70–99)
Potassium: 4.4 mmol/L (ref 3.5–5.1)
SODIUM: 140 mmol/L (ref 135–145)
Total Bilirubin: 1 mg/dL (ref 0.3–1.2)
Total Protein: 8.9 g/dL — ABNORMAL HIGH (ref 6.5–8.1)

## 2018-08-18 LAB — URINALYSIS, ROUTINE W REFLEX MICROSCOPIC
BACTERIA UA: NONE SEEN
Bilirubin Urine: NEGATIVE
GLUCOSE, UA: NEGATIVE mg/dL
Hgb urine dipstick: NEGATIVE
Ketones, ur: 5 mg/dL — AB
Nitrite: NEGATIVE
PROTEIN: 30 mg/dL — AB
Specific Gravity, Urine: 1.019 (ref 1.005–1.030)
pH: 5 (ref 5.0–8.0)

## 2018-08-18 LAB — AMMONIA: AMMONIA: 40 umol/L — AB (ref 9–35)

## 2018-08-18 LAB — I-STAT CG4 LACTIC ACID, ED
LACTIC ACID, VENOUS: 4.06 mmol/L — AB (ref 0.5–1.9)
LACTIC ACID, VENOUS: 2.16 mmol/L — AB (ref 0.5–1.9)

## 2018-08-18 LAB — CBG MONITORING, ED: Glucose-Capillary: 144 mg/dL — ABNORMAL HIGH (ref 70–99)

## 2018-08-18 MED ORDER — ENOXAPARIN SODIUM 40 MG/0.4ML ~~LOC~~ SOLN
40.0000 mg | Freq: Every day | SUBCUTANEOUS | Status: DC
  Administered 2018-08-19 – 2018-08-21 (×4): 40 mg via SUBCUTANEOUS
  Filled 2018-08-18 (×4): qty 0.4

## 2018-08-18 MED ORDER — SODIUM CHLORIDE 0.9 % IV BOLUS
1000.0000 mL | Freq: Once | INTRAVENOUS | Status: AC
  Administered 2018-08-18: 1000 mL via INTRAVENOUS

## 2018-08-18 MED ORDER — CLONIDINE HCL 0.1 MG PO TABS
0.1000 mg | ORAL_TABLET | Freq: Three times a day (TID) | ORAL | Status: DC | PRN
  Administered 2018-08-19 (×2): 0.1 mg via ORAL
  Filled 2018-08-18 (×2): qty 1

## 2018-08-18 MED ORDER — GABAPENTIN 100 MG PO CAPS
100.0000 mg | ORAL_CAPSULE | Freq: Every day | ORAL | Status: DC
  Administered 2018-08-19: 100 mg via ORAL
  Filled 2018-08-18: qty 1

## 2018-08-18 MED ORDER — SENNA 8.6 MG PO TABS
1.0000 | ORAL_TABLET | Freq: Two times a day (BID) | ORAL | Status: DC
  Administered 2018-08-19 – 2018-08-22 (×7): 8.6 mg via ORAL
  Filled 2018-08-18 (×8): qty 1

## 2018-08-18 MED ORDER — IPRATROPIUM-ALBUTEROL 0.5-2.5 (3) MG/3ML IN SOLN
3.0000 mL | Freq: Four times a day (QID) | RESPIRATORY_TRACT | Status: DC
  Filled 2018-08-18: qty 3

## 2018-08-18 MED ORDER — ONDANSETRON HCL 4 MG/2ML IJ SOLN: 4.0000 mg | Freq: Four times a day (QID) | INTRAMUSCULAR | Status: DC | PRN

## 2018-08-18 MED ORDER — DULOXETINE HCL 60 MG PO CPEP
60.0000 mg | ORAL_CAPSULE | Freq: Every day | ORAL | Status: DC
  Administered 2018-08-20 – 2018-08-22 (×3): 60 mg via ORAL
  Filled 2018-08-18: qty 1
  Filled 2018-08-18 (×2): qty 2
  Filled 2018-08-18 (×2): qty 1
  Filled 2018-08-18 (×2): qty 2
  Filled 2018-08-18: qty 1

## 2018-08-18 MED ORDER — SODIUM CHLORIDE 0.9 % IV SOLN
INTRAVENOUS | Status: DC
  Administered 2018-08-18 – 2018-08-22 (×7): via INTRAVENOUS

## 2018-08-18 MED ORDER — ONDANSETRON HCL 4 MG PO TABS: 4.0000 mg | ORAL_TABLET | Freq: Four times a day (QID) | ORAL | Status: DC | PRN

## 2018-08-18 NOTE — ED Notes (Signed)
Bed: LV74 Expected date:  Expected time:  Means of arrival:  Comments: EMS-AMS
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
## 2018-08-18 NOTE — ED Notes (Signed)
EDP [place] notified of POC [place] of 4.06

## 2018-08-18 NOTE — Progress Notes (Addendum)
CSW reviewed chart from June and saw pt's son's on last vist to the ED provided the number ph: [phone] which is the pt's home number.  CSW called pt's son [name] at ph: [phone] and left was unable to leave a VM.  CSW called non-emergency dispatch and requested that [employer] conduct a wellness check to the pt's home to insure that pt's son is aware the hospital is attempting contact him for assistance with treating the pt in the ED and for collateral information.   CSW awaiting return call from [employer] who is en route now.  CSW will continue to follow for D/C needs.  Alphonse Guild. Jeorgia Helming, LCSW, LCAS, CSI Clinical Social Worker Ph: [phone]

## 2018-08-18 NOTE — ED Notes (Addendum)
Patient transported to X-ray 
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
## 2018-08-18 NOTE — ED Notes (Signed)
Pt able to tell me her name however unable to state time, situation or location. Pt repeating phrase such as "there she goes". When attempting to perform assessment or provide any care, pt becomes increasingly agitated and yells outs. Pt freely moves all extremities with equal strength to all extremeties.

## 2018-08-18 NOTE — ED Notes (Signed)
EDP [place] notified of 2.16 POC [place]

## 2018-08-18 NOTE — ED Notes (Signed)
Pt continues to call out and repeat her words and talk to an unseen person. RN has tried to communicate with pt multiple times. Pt will repeat what RN says but does not respond well when asked questions.
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
## 2018-08-18 NOTE — H&P (Signed)
History and Physical    [name] FXT:[id number] DOB: [birth year] DOA: 08/18/2018  Referring MD/NP/PA: Maury Dus, PA  PCP: [name], MD   Patient coming from: Home  Chief Complaint: Confusion  HPI: [name] is a 82 y.o. female with medical history significant of hypertension and questionable dementia who was brought in by EMS with altered mental status from home.  Patient is currently confused and not able to give any history.  She is oriented to self only.  She has so on the is not available.  He himself has issues with polysubstance abuse apparently and is therefore not reliable.  No prior psychiatric history.  There was not that the nurses communicated with the son who said mother was doing fine when he left work early this morning but by 4 PM she was altered.  She was previously able to participate in her ADLs and take care of herself.  She has history of COPD but not on home oxygen.  Patient is noted to have osteoporosis and chronic back pain for which she takes narcotics and other medications.  Initial work-up in the ER shows no obvious cause of her confusion.  ED Course: Patient is afebrile with a temperature 98.2 but blood pressure 173/76 pulse 140 respiratory of 21 oxygen sat 92% room air.  She has a white count of 15,000 hemoglobin 13.3 and platelet 478.  Sodium 140 with potassium 4.4, chloride is 97 CO2 27 BUN is 36 creatinine 1.31.  Glucose is 151 and lactic acid 2.16.  Head CT without contrast is negative urinalysis is negative and chest x-ray is negative.  She has an ABG showed normal pH 7.36 PCO2 45 this is venous blood.  Review of Systems: Patient is confused so a full review of system is not obtainable   Past Medical History:  Diagnosis Date  . Chronic back pain   . Colitis 03/19/2014  . COPD (chronic obstructive pulmonary disease) ([place])   . Hypertension   . On home oxygen therapy 06-14-13   continuos 2.5l/m nasally-24/7  . Osteoporosis   .  Oxygen dependent     Past Surgical History:  Procedure Laterality Date  . ABDOMINAL HYSTERECTOMY    . ESOPHAGOGASTRODUODENOSCOPY N/A 06/15/2013   Procedure: ESOPHAGOGASTRODUODENOSCOPY (EGD);  Surgeon: [name], MD;  Location: [facility];  Service: Endoscopy;  Laterality: N/A;  . IR GENERIC HISTORICAL  10/15/2016   IR KYPHO LUMBAR INC FX REDUCE BONE BX UNI/BIL CANNULATION INC/IMAGING 10/15/2016 Luanne Bras, MD [facility]  . sacroplasty  06-14-13   05-10-13 IVR [facility] for fracture stabilization     reports that she quit smoking about 7 years ago. Her smoking use included cigarettes. She has never used smokeless tobacco. She reports that she does not drink alcohol or use drugs.  Allergies  Allergen Reactions  . Ambien [Zolpidem] Other (See Comments)    Hallucinations and paranoid    No family history on file.   Prior to Admission medications   Medication Sig Start Date End Date Taking? Authorizing Provider  albuterol (PROVENTIL HFA;VENTOLIN HFA) 108 (90 Base) MCG/ACT inhaler Inhale 1-2 puffs into the lungs every 4 (four) hours as needed for wheezing or shortness of breath. 06/09/18  Yes Duffy Bruce, MD  DULoxetine (CYMBALTA) 30 MG capsule Take 1 capsule by mouth as directed. 1 capsule daily for 1 week, increase to 2 capsules therafter 07/12/18  Yes [provider]  gabapentin (NEURONTIN) 100 MG capsule Take 100-300 mg by mouth at bedtime as  needed for pain. 07/12/18  Yes [provider]  lisinopril-hydrochlorothiazide (PRINZIDE,ZESTORETIC) 10-12.5 MG tablet Take 1 tablet by mouth daily. 07/12/18  Yes [provider]  senna (SENOKOT) 8.6 MG TABS tablet Take 1 tablet (8.6 mg total) by mouth 2 (two) times daily. 10/16/16  Yes Bonnielee Haff, MD  amLODipine (NORVASC) 2.5 MG tablet Take 1 tablet (2.5 mg total) by mouth daily for 14 days. Patient not taking: Reported on 08/18/2018 06/09/18 06/23/18  Duffy Bruce, MD  cefpodoxime (VANTIN) 200 MG  tablet Take 1 tablet (200 mg total) by mouth every 12 (twelve) hours. For 5 more days Patient not taking: Reported on 08/18/2018 10/16/16   Bonnielee Haff, MD  docusate sodium (COLACE) 250 MG capsule Take 1 capsule (250 mg total) by mouth daily. Patient not taking: Reported on 08/18/2018 06/09/18   Duffy Bruce, MD  DULoxetine (CYMBALTA) 30 MG capsule Take 1 capsule (30 mg total) by mouth daily for 14 days. Patient not taking: Reported on 08/18/2018 06/09/18 06/23/18  Duffy Bruce, MD  methocarbamol (ROBAXIN) 500 MG tablet Take 1 tablet (500 mg total) by mouth every 8 (eight) hours as needed for muscle spasms. Patient not taking: Reported on 08/18/2018 06/09/18   Duffy Bruce, MD  oxyCODONE-acetaminophen (PERCOCET/ROXICET) 5-325 MG tablet Take 1-2 tablets by mouth every 6 (six) hours as needed for moderate pain or severe pain. Patient not taking: Reported on 08/18/2018 06/09/18   Duffy Bruce, MD  polyethylene glycol [facility] / Floria Raveling) packet Take 17 g by mouth daily as needed for mild constipation. Patient not taking: Reported on 08/18/2018 10/16/16   Bonnielee Haff, MD    Physical Exam: Vitals:   08/18/18 1931 08/18/18 2051 08/18/18 2226 08/18/18 2251  BP:  (!) 162/73 (!) 173/76 (!) 169/82  Pulse:  98 (!) 104 (!) 103  Resp:  18 17 (!) 21  Temp: 98.3 F (36.8 C)   98.3 F (36.8 C)  TempSrc: Rectal     SpO2:  95% 95% 96%      Constitutional: NAD, calm, comfortable Vitals:   08/18/18 1931 08/18/18 2051 08/18/18 2226 08/18/18 2251  BP:  (!) 162/73 (!) 173/76 (!) 169/82  Pulse:  98 (!) 104 (!) 103  Resp:  18 17 (!) 21  Temp: 98.3 F (36.8 C)   98.3 F (36.8 C)  TempSrc: Rectal     SpO2:  95% 95% 96%   Eyes: PERRL, lids and conjunctivae normal ENMT: Mucous membranes are moist. Posterior pharynx clear of any exudate or lesions.Normal dentition.  Neck: normal, supple, no masses, no thyromegaly Respiratory: clear to auscultation bilaterally, no wheezing, no crackles. Normal  respiratory effort. No accessory muscle use.  Cardiovascular: Regular rate and rhythm, no murmurs / rubs / gallops. No extremity edema. 2+ pedal pulses. No carotid bruits.  Abdomen: no tenderness, no masses palpated. No hepatosplenomegaly. Bowel sounds positive.  Musculoskeletal: no clubbing / cyanosis. No joint deformity upper and lower extremities. Good ROM, no contractures. Normal muscle tone.  Skin: no rashes, lesions, ulcers. No induration Neurologic: Confused, no focal findings CN 2-12 grossly intact. Sensation intact, DTR normal. Strength 5/5 in all 4.  Psychiatric: Patient is confused, alert but disoriented, she is only oriented in person.   Labs on Admission: I have personally reviewed following labs and imaging studies  CBC: Recent Labs  Lab 08/18/18 1747  WBC 14.9*  HGB 13.3  HCT 42.2  MCV 88.3  PLT 834*   Basic Metabolic Panel: Recent Labs  Lab 08/18/18 1747  NA 140  K  4.4  CL 97*  CO2 27  GLUCOSE 151*  BUN 36*  CREATININE 1.31*  CALCIUM 9.7   GFR: CrCl cannot be calculated (Unknown ideal weight.). Liver Function Tests: Recent Labs  Lab 08/18/18 1747  AST 24  ALT 11  ALKPHOS 76  BILITOT 1.0  PROT 8.9*  ALBUMIN 4.6   No results for input(s): LIPASE, AMYLASE in the last 168 hours. Recent Labs  Lab 08/18/18 1935  AMMONIA 40*   Coagulation Profile: No results for input(s): INR, PROTIME in the last 168 hours. Cardiac Enzymes: No results for input(s): CKTOTAL, CKMB, CKMBINDEX, TROPONINI in the last 168 hours. BNP (last 3 results) No results for input(s): PROBNP in the last 8760 hours. HbA1C: No results for input(s): HGBA1C in the last 72 hours. CBG: Recent Labs  Lab 08/18/18 1807  GLUCAP 144*   Lipid Profile: No results for input(s): CHOL, HDL, LDLCALC, TRIG, CHOLHDL, LDLDIRECT in the last 72 hours. Thyroid Function Tests: No results for input(s): TSH, T4TOTAL, FREET4, T3FREE, THYROIDAB in the last 72 hours. Anemia Panel: No results for  input(s): VITAMINB12, FOLATE, FERRITIN, TIBC, IRON, RETICCTPCT in the last 72 hours. Urine analysis:    Component Value Date/Time   COLORURINE YELLOW 08/18/2018 1820   APPEARANCEUR CLEAR 08/18/2018 1820   LABSPEC 1.019 08/18/2018 1820   PHURINE 5.0 08/18/2018 1820   GLUCOSEU NEGATIVE 08/18/2018 1820   HGBUR NEGATIVE 08/18/2018 1820   BILIRUBINUR NEGATIVE 08/18/2018 1820   KETONESUR 5 (A) 08/18/2018 1820   PROTEINUR 30 (A) 08/18/2018 1820   UROBILINOGEN 0.2 03/19/2014 1657   NITRITE NEGATIVE 08/18/2018 1820   LEUKOCYTESUR TRACE (A) 08/18/2018 1820   Sepsis Labs: @LABRCNTIP (procalcitonin:4,lacticidven:4) )No results found for this or any previous visit (from the past 240 hour(s)).   Radiological Exams on Admission: Dg Chest 2 View  Result Date: 08/18/2018 CLINICAL DATA:  Altered mental status.  Sepsis. EXAM: CHEST - 2 VIEW COMPARISON:  None. FINDINGS: Lungs are hypoinflated without focal airspace consolidation or effusion. Cardiomediastinal silhouette and remainder of the exam is unchanged. IMPRESSION: Hypoinflation without acute cardiopulmonary disease. Electronically Signed   By: Marin Olp M.D.   On: 08/18/2018 18:46   Ct Head Wo Contrast  Result Date: 08/18/2018 CLINICAL DATA:  Altered mental status. EXAM: CT HEAD WITHOUT CONTRAST TECHNIQUE: Contiguous axial images were obtained from the base of the skull through the vertex without intravenous contrast. COMPARISON:  06/09/2018 FINDINGS: Brain: Ventricles, cisterns and other CSF spaces are normal. Small old lacune infarct over the left lentiform nucleus. No mass, mass effect, shift of midline structures or acute hemorrhage. No evidence of acute infarction. Vascular: No hyperdense vessel or unexpected calcification. Skull: Normal. Negative for fracture or focal lesion. Sinuses/Orbits: Orbits are within normal. There is opacification/mucosal membrane thickening involving the right maxillary sinus unchanged. Other: None. IMPRESSION: No  acute findings. Old small left central lacunar infarct. Chronic inflammatory change of the right maxillary sinus. Electronically Signed   By: Marin Olp M.D.   On: 08/18/2018 21:37      Assessment/Plan Principal Problem:   Altered mental state Active Problems:   Hypertension   COPD (chronic obstructive pulmonary disease) ([place])   Acute encephalopathy     #1 altered mental status: Patient appears to be having acute encephalopathy probably metabolic due to dehydration.  She could also be having this due to medications.  She is taking narcotics and muscle relaxants for chronic low back pain.  We will check urine drug screen.  Patient lives with her son and there is  question of medication abuse by the son.  If so patient may have mistakenly taken some medication that she should not be taking.  Her urinalysis as well as chest x-ray showed no obvious infection.  We will monitor patient closely especially neurologically.  Social worker consult as well as PT OT consult when stable.  She may require going to skilled facility.  #2 COPD: No evidence of exacerbation.  No significant hypercarbia to explain the current confusion.  We will empirically treat patient with nebulizer.  #3 hypertension: continue with blood pressure control.  Avoid diuretics.  #4 leukocytosis: Probably secondary to dehydration.  Aggressively hydrate patient.  #5 dehydration: Elevated BUN and creatinine.  Patient has received 2 L of normal saline in the ER.  We will continue with normal saline infusion in the hospital.  #6 acute kidney injury: Most likely prerenal secondary to dehydration.  Continue with aggressive hydration with   DVT prophylaxis: 11  Code Status: Full code  Family Communication: No family available.  Nurses have discussed with son over the phone but not reachable now  Disposition Plan: To be determined  Consults called: Social worker  Admission status: Inpatient  Severity of Illness: The appropriate  patient status for this patient is INPATIENT. Inpatient status is judged to be reasonable and necessary in order to provide the required intensity of service to ensure the patient's safety. The patient's presenting symptoms, physical exam findings, and initial radiographic and laboratory data in the context of their chronic comorbidities is felt to place them at high risk for further clinical deterioration. Furthermore, it is not anticipated that the patient will be medically stable for discharge from the hospital within 2 midnights of admission. The following factors support the patient status of inpatient.   " The patient's presenting symptoms include confusion. " The worrisome physical exam findings include altered mental status. " The initial radiographic and laboratory data are worrisome because of evidence of dehydration and leukocytosis. " The chronic co-morbidities include history of COPD with hypertension.   * I certify that at the point of admission it is my clinical judgment that the patient will require inpatient hospital care spanning beyond 2 midnights from the point of admission due to high intensity of service, high risk for further deterioration and high frequency of surveillance required.Barbette Merino MD [facility] Pager [phone]  If 7PM-7AM, please contact night-coverage [email or web address] Password [facility]  08/18/2018, 11:16 PM

## 2018-08-19 ENCOUNTER — Inpatient Hospital Stay (HOSPITAL_COMMUNITY): Payer: Medicare Other

## 2018-08-19 ENCOUNTER — Encounter (HOSPITAL_COMMUNITY): Payer: Self-pay

## 2018-08-19 DIAGNOSIS — E860 Dehydration: Secondary | ICD-10-CM

## 2018-08-19 DIAGNOSIS — M4856XA Collapsed vertebra, not elsewhere classified, lumbar region, initial encounter for fracture: Secondary | ICD-10-CM | POA: Diagnosis not present

## 2018-08-19 DIAGNOSIS — I10 Essential (primary) hypertension: Secondary | ICD-10-CM

## 2018-08-19 DIAGNOSIS — R4182 Altered mental status, unspecified: Secondary | ICD-10-CM | POA: Diagnosis not present

## 2018-08-19 DIAGNOSIS — E538 Deficiency of other specified B group vitamins: Secondary | ICD-10-CM | POA: Diagnosis present

## 2018-08-19 DIAGNOSIS — G9340 Encephalopathy, unspecified: Secondary | ICD-10-CM

## 2018-08-19 DIAGNOSIS — J431 Panlobular emphysema: Secondary | ICD-10-CM | POA: Diagnosis not present

## 2018-08-19 DIAGNOSIS — M4854XA Collapsed vertebra, not elsewhere classified, thoracic region, initial encounter for fracture: Secondary | ICD-10-CM | POA: Diagnosis not present

## 2018-08-19 DIAGNOSIS — G9341 Metabolic encephalopathy: Secondary | ICD-10-CM | POA: Diagnosis not present

## 2018-08-19 DIAGNOSIS — N179 Acute kidney failure, unspecified: Secondary | ICD-10-CM | POA: Diagnosis not present

## 2018-08-19 DIAGNOSIS — R000 Tachycardia, unspecified: Secondary | ICD-10-CM

## 2018-08-19 LAB — COMPREHENSIVE METABOLIC PANEL
ALK PHOS: 55 U/L (ref 38–126)
ALT: 11 U/L (ref 0–44)
AST: 16 U/L (ref 15–41)
Albumin: 3.4 g/dL — ABNORMAL LOW (ref 3.5–5.0)
Anion gap: 9 (ref 5–15)
BUN: 28 mg/dL — AB (ref 8–23)
CALCIUM: 8.2 mg/dL — AB (ref 8.9–10.3)
CHLORIDE: 106 mmol/L (ref 98–111)
CO2: 24 mmol/L (ref 22–32)
CREATININE: 0.75 mg/dL (ref 0.44–1.00)
GFR calc Af Amer: >60 mL/min (ref >60)
Glucose, Bld: 112 mg/dL — ABNORMAL HIGH (ref 70–99)
Potassium: 3.9 mmol/L (ref 3.5–5.1)
SODIUM: 139 mmol/L (ref 135–145)
Total Bilirubin: 0.8 mg/dL (ref 0.3–1.2)
Total Protein: 6.6 g/dL (ref 6.5–8.1)

## 2018-08-19 LAB — CBC
HCT: 33.7 % — ABNORMAL LOW (ref 36.0–46.0)
Hemoglobin: 10.5 g/dL — ABNORMAL LOW (ref 12.0–15.0)
MCH: 27.1 pg (ref 26.0–34.0)
MCHC: 31.2 g/dL (ref 30.0–36.0)
MCV: 87.1 fL (ref 78.0–100.0)
Platelets: 378 10*3/uL (ref 150–400)
RBC: 3.87 MIL/uL (ref 3.87–5.11)
RDW: 14.5 % (ref 11.5–15.5)
WBC: 8.7 10*3/uL (ref 4.0–10.5)

## 2018-08-19 LAB — IRON AND TIBC
IRON: 69 ug/dL (ref 28–170)
Saturation Ratios: 21 % (ref 10.4–31.8)
TIBC: 335 ug/dL (ref 250–450)
UIBC: 266 ug/dL

## 2018-08-19 LAB — VITAMIN B12: VITAMIN B 12: 104 pg/mL — AB (ref 180–914)

## 2018-08-19 LAB — TSH
TSH: 3.182 u[IU]/mL (ref 0.350–4.500)
TSH: 1.884 u[IU]/mL (ref 0.350–4.500)

## 2018-08-19 LAB — FOLATE: FOLATE: 10 ng/mL (ref >5.9)

## 2018-08-19 LAB — FERRITIN: Ferritin: 50 ng/mL (ref 11–307)

## 2018-08-19 LAB — AMMONIA: Ammonia: 18 umol/L (ref 9–35)

## 2018-08-19 LAB — LACTIC ACID, PLASMA: Lactic Acid, Venous: 0.7 mmol/L (ref 0.5–1.9)

## 2018-08-19 MED ORDER — SODIUM CHLORIDE 0.9 % IV BOLUS
500.0000 mL | Freq: Once | INTRAVENOUS | Status: AC
  Administered 2018-08-19: 500 mL via INTRAVENOUS

## 2018-08-19 MED ORDER — AMLODIPINE BESYLATE 5 MG PO TABS
2.5000 mg | ORAL_TABLET | Freq: Every day | ORAL | Status: DC
  Administered 2018-08-19 – 2018-08-20 (×2): 2.5 mg via ORAL
  Filled 2018-08-19 (×2): qty 1

## 2018-08-19 MED ORDER — HYDRALAZINE HCL 20 MG/ML IJ SOLN
5.0000 mg | Freq: Four times a day (QID) | INTRAMUSCULAR | Status: DC | PRN
  Filled 2018-08-19 (×2): qty 1

## 2018-08-19 MED ORDER — GABAPENTIN 100 MG PO CAPS: 100.0000 mg | ORAL_CAPSULE | Freq: Every evening | ORAL | Status: DC | PRN

## 2018-08-19 MED ORDER — CYANOCOBALAMIN 1000 MCG/ML IJ SOLN
1000.0000 ug | Freq: Every day | INTRAMUSCULAR | Status: DC
  Administered 2018-08-19 – 2018-08-22 (×4): 1000 ug via INTRAMUSCULAR
  Filled 2018-08-19 (×4): qty 1

## 2018-08-19 MED ORDER — IPRATROPIUM-ALBUTEROL 0.5-2.5 (3) MG/3ML IN SOLN: 3.0000 mL | RESPIRATORY_TRACT | Status: DC | PRN

## 2018-08-19 NOTE — Progress Notes (Signed)
Received pt. From ED A&OX2.VS stabl with slightly elevated BP.169/82,93% on room air.Pt. oriented to the room ,denied pain when asked,pt. repeats to  Her own words several times and talked to the unseen persons.Pt. Is resting conformably and continue to monitor.

## 2018-08-19 NOTE — Care Management Note (Signed)
Case Management Note  Patient Details  Name: [name] MRN: [id number] Date of Birth: 20-Jul-1936  Subjective/Objective:                  82 y.o. female with medical history significant of hypertension and questionable dementia who was brought in by EMS with altered mental status from home.  Patient is currently confused and not able to give any history.  She is oriented to self only.  She has so on the is not available.  He himself has issues with polysubstance abuse apparently and is therefore not reliable.  No prior psychiatric history.  There was not that the nurses communicated with the son who said mother was doing fine when he left work early this morning but by 4 PM she was altered.  She was previously able to participate in her ADLs and take care of herself.  She has history of COPD but not on home oxygen.  Patient is noted to have osteoporosis and chronic back pain for which she takes narcotics and other medications.  Initial work-up in the ER shows no obvious cause of her confusion. afebrile with a temperature 98.2 but blood pressure 173/76 pulse 140 respiratory of 21 oxygen sat 92% room air.  She has a white count of 15,000 hemoglobin 13.3 and platelet 478.  Sodium 140 with potassium 4.4, chloride is 97 CO2 27 BUN is 36 creatinine 1.31.  Glucose is 151 and lactic acid 2.16.  Head CT without contrast is negative urinalysis is negative and chest x-ray is negative.  She has an ABG showed normal pH 7.36 PCO2 45 this is venous blood.   Action/Plan: Will follow for cm needs aND PROGRESSION Expected Discharge Date:                  Expected Discharge Plan:  Home/Self Care  In-House Referral:     Discharge planning Services  CM Consult  Post Acute Care Choice:    Choice offered to:     DME Arranged:    DME Agency:     HH Arranged:    HH Agency:     Status of Service:  In process, will continue to follow  If discussed at Long Length of Stay Meetings, dates discussed:     Additional Comments:  Leeroy Cha, RN 08/19/2018, 2:35 PM

## 2018-08-19 NOTE — Progress Notes (Addendum)
PROGRESS NOTE    [name]  JQB:[id number] DOB: 1936/11/24 DOA: 08/18/2018 PCP: [name], MD    Brief Narrative: Patient 82 year old female history of hypertension, questionable dementia presented to the ED via EMS secondary to altered mental status.  Infectious work-up negative to date.  Patient admitted.  CT head negative.  Patient noted to be dehydrated and placed on IV fluids.   Assessment & Plan:   Principal Problem:   Altered mental state Active Problems:   Hypertension   COPD (chronic obstructive pulmonary disease) ([facility])   Acute encephalopathy  #1 acute metabolic encephalopathy Questionable etiology.  Likely secondary to dehydration in the setting of possible underlying dementia.  Patient noted to be on narcotics and muscle relaxants for chronic low back pain in the past at home however patient and son state patient has not been on any narcotic pain medications recently.  Patient improving clinically.  Patient was being very repetitive however once patient was redirected she seemed to answer questions appropriately and following commands.  Patient alert and oriented x2 only.  Continue hydration with IV fluids.  Follow.  2.  COPD Stable.  Continue nebs as needed.  3.  Hypertension Start Norvasc 2.5 mg daily uptitrate as needed for better blood pressure control.  4.  Dehydration Continue hydration with IV fluids.  5.  Acute kidney injury Likely secondary to a prerenal azotemia.  Urinalysis with trace leukocytes, nitrite negative, no bacteria seen, 0-5 WBCs.  Renal function improving with hydration.  Continue IV fluids.  Follow.  6 tachycardia Patient noted to be tachycardiac early on.  EKG obtained consistent with a sinus tachycardia.  Check a TSH.  Give a bolus of normal saline.  Continue IV fluids.  Follow.  7.  Vitamin B12 deficiency Placed on vitamin B12 1000 MCG's IM daily x7 days, and then weekly x1 month, and then monthly.  Outpatient follow-up  with PCP.   DVT prophylaxis: Lovenox Code Status: Full Family Communication: Updated patient and son at bedside. Disposition Plan: Likely back home when clinically improved and back at baseline.   Consultants:   None  Procedures:   CT head 08/18/2018  Chest x-ray 08/18/2018  Antimicrobials:   None   Subjective: Patient in bed initially yelling repetitively however went home down answers questions appropriately and following commands.  No chest pain.  No shortness of breath.  No abdominal pain.  No dysuria.  Objective: Vitals:   08/19/18 0606 08/19/18 0725 08/19/18 1013 08/19/18 1016  BP: (!) 169/80  (!) 174/66 (!) 158/75  Pulse: 95 (!) 102 93 93  Resp: 20 18    Temp: 98.6 F (37 C)     TempSrc:      SpO2: 93% 94%    Weight: 64.1 kg      No intake or output data in the 24 hours ending 08/19/18 1024 Filed Weights   08/19/18 0606  Weight: 64.1 kg    Examination:  General exam: Appears calm and comfortable  Respiratory system: Clear to auscultation. Respiratory effort normal. Cardiovascular system: S1 & S2 heard, RRR. No JVD, murmurs, rubs, gallops or clicks. No pedal edema. Gastrointestinal system: Abdomen is nondistended, soft and nontender. No organomegaly or masses felt. Normal bowel sounds heard. Central nervous system: Alert and oriented. No focal neurological deficits. Extremities: Symmetric 5 x 5 power. Skin: No rashes, lesions or ulcers Psychiatry: Judgement and insight appear fair. Mood & affect appropriate.     Data Reviewed: I have personally reviewed following labs and imaging  studies  CBC: Recent Labs  Lab 08/18/18 1747 08/19/18 0544  WBC 14.9* 8.7  HGB 13.3 10.5*  HCT 42.2 33.7*  MCV 88.3 87.1  PLT 478* 220   Basic Metabolic Panel: Recent Labs  Lab 08/18/18 1747 08/19/18 0544  NA 140 139  K 4.4 3.9  CL 97* 106  CO2 27 24  GLUCOSE 151* 112*  BUN 36* 28*  CREATININE 1.31* 0.75  CALCIUM 9.7 8.2*   GFR: CrCl cannot be  calculated (Unknown ideal weight.). Liver Function Tests: Recent Labs  Lab 08/18/18 1747 08/19/18 0544  AST 24 16  ALT 11 11  ALKPHOS 76 55  BILITOT 1.0 0.8  PROT 8.9* 6.6  ALBUMIN 4.6 3.4*   No results for input(s): LIPASE, AMYLASE in the last 168 hours. Recent Labs  Lab 08/18/18 1935 08/19/18 0836  AMMONIA 40* 18   Coagulation Profile: No results for input(s): INR, PROTIME in the last 168 hours. Cardiac Enzymes: No results for input(s): CKTOTAL, CKMB, CKMBINDEX, TROPONINI in the last 168 hours. BNP (last 3 results) No results for input(s): PROBNP in the last 8760 hours. HbA1C: No results for input(s): HGBA1C in the last 72 hours. CBG: Recent Labs  Lab 08/18/18 1807  GLUCAP 144*   Lipid Profile: No results for input(s): CHOL, HDL, LDLCALC, TRIG, CHOLHDL, LDLDIRECT in the last 72 hours. Thyroid Function Tests: Recent Labs    08/18/18 2331  TSH 1.884   Anemia Panel: Recent Labs    08/19/18 0836  VITAMINB12 104*  FOLATE 10.0  FERRITIN 50  TIBC 335  IRON 69   Sepsis Labs: Recent Labs  Lab 08/18/18 1754 08/18/18 1944 08/19/18 0836  LATICACIDVEN 4.06* 2.16* 0.7    No results found for this or any previous visit (from the past 240 hour(s)).       Radiology Studies: Dg Chest 2 View  Result Date: 08/18/2018 CLINICAL DATA:  Altered mental status.  Sepsis. EXAM: CHEST - 2 VIEW COMPARISON:  None. FINDINGS: Lungs are hypoinflated without focal airspace consolidation or effusion. Cardiomediastinal silhouette and remainder of the exam is unchanged. IMPRESSION: Hypoinflation without acute cardiopulmonary disease. Electronically Signed   By: Marin Olp M.D.   On: 08/18/2018 18:46   Ct Head Wo Contrast  Result Date: 08/18/2018 CLINICAL DATA:  Altered mental status. EXAM: CT HEAD WITHOUT CONTRAST TECHNIQUE: Contiguous axial images were obtained from the base of the skull through the vertex without intravenous contrast. COMPARISON:  06/09/2018 FINDINGS:  Brain: Ventricles, cisterns and other CSF spaces are normal. Small old lacune infarct over the left lentiform nucleus. No mass, mass effect, shift of midline structures or acute hemorrhage. No evidence of acute infarction. Vascular: No hyperdense vessel or unexpected calcification. Skull: Normal. Negative for fracture or focal lesion. Sinuses/Orbits: Orbits are within normal. There is opacification/mucosal membrane thickening involving the right maxillary sinus unchanged. Other: None. IMPRESSION: No acute findings. Old small left central lacunar infarct. Chronic inflammatory change of the right maxillary sinus. Electronically Signed   By: Marin Olp M.D.   On: 08/18/2018 21:37        Scheduled Meds: . amLODipine  2.5 mg Oral Daily  . DULoxetine  60 mg Oral Daily  . enoxaparin (LOVENOX) injection  40 mg Subcutaneous QHS  . senna  1 tablet Oral BID   Continuous Infusions: . sodium chloride 100 mL/hr at 08/18/18 2322     LOS: 1 day    Time spent: 35 minutes    Irine Seal, MD [facility] Pager [phone]  If 7PM-7AM, please contact night-coverage [email or web address] Password [facility] 08/19/2018, 10:24 AM

## 2018-08-20 ENCOUNTER — Other Ambulatory Visit: Payer: Self-pay

## 2018-08-20 ENCOUNTER — Encounter (HOSPITAL_COMMUNITY): Payer: Self-pay

## 2018-08-20 DIAGNOSIS — M4854XA Collapsed vertebra, not elsewhere classified, thoracic region, initial encounter for fracture: Secondary | ICD-10-CM | POA: Diagnosis not present

## 2018-08-20 DIAGNOSIS — M545 Low back pain: Secondary | ICD-10-CM

## 2018-08-20 DIAGNOSIS — E860 Dehydration: Secondary | ICD-10-CM | POA: Diagnosis not present

## 2018-08-20 DIAGNOSIS — J431 Panlobular emphysema: Secondary | ICD-10-CM | POA: Diagnosis not present

## 2018-08-20 DIAGNOSIS — R4182 Altered mental status, unspecified: Secondary | ICD-10-CM | POA: Diagnosis not present

## 2018-08-20 DIAGNOSIS — N179 Acute kidney failure, unspecified: Secondary | ICD-10-CM | POA: Diagnosis not present

## 2018-08-20 DIAGNOSIS — I10 Essential (primary) hypertension: Secondary | ICD-10-CM | POA: Diagnosis not present

## 2018-08-20 DIAGNOSIS — M4856XA Collapsed vertebra, not elsewhere classified, lumbar region, initial encounter for fracture: Secondary | ICD-10-CM | POA: Diagnosis not present

## 2018-08-20 DIAGNOSIS — G9340 Encephalopathy, unspecified: Secondary | ICD-10-CM | POA: Diagnosis not present

## 2018-08-20 DIAGNOSIS — G9341 Metabolic encephalopathy: Secondary | ICD-10-CM | POA: Diagnosis not present

## 2018-08-20 DIAGNOSIS — G8929 Other chronic pain: Secondary | ICD-10-CM

## 2018-08-20 LAB — CBC WITH DIFFERENTIAL/PLATELET
BASOS PCT: 0 %
Basophils Absolute: 0 10*3/uL (ref 0.0–0.1)
EOS ABS: 0.2 10*3/uL (ref 0.0–0.7)
Eosinophils Relative: 2 %
HCT: 33.2 % — ABNORMAL LOW (ref 36.0–46.0)
Hemoglobin: 10.6 g/dL — ABNORMAL LOW (ref 12.0–15.0)
LYMPHS ABS: 1.3 10*3/uL (ref 0.7–4.0)
Lymphocytes Relative: 15 %
MCH: 27.8 pg (ref 26.0–34.0)
MCHC: 31.9 g/dL (ref 30.0–36.0)
MCV: 87.1 fL (ref 78.0–100.0)
MONO ABS: 0.6 10*3/uL (ref 0.1–1.0)
MONOS PCT: 7 %
Neutro Abs: 6.4 10*3/uL (ref 1.7–7.7)
Neutrophils Relative %: 76 %
Platelets: 345 10*3/uL (ref 150–400)
RBC: 3.81 MIL/uL — ABNORMAL LOW (ref 3.87–5.11)
RDW: 14.4 % (ref 11.5–15.5)
WBC: 8.5 10*3/uL (ref 4.0–10.5)

## 2018-08-20 LAB — BASIC METABOLIC PANEL
Anion gap: 14 (ref 5–15)
BUN: 14 mg/dL (ref 8–23)
CALCIUM: 8.2 mg/dL — AB (ref 8.9–10.3)
CO2: 20 mmol/L — AB (ref 22–32)
CREATININE: 0.61 mg/dL (ref 0.44–1.00)
Chloride: 106 mmol/L (ref 98–111)
GFR calc non Af Amer: >60 mL/min (ref >60)
Glucose, Bld: 91 mg/dL (ref 70–99)
Potassium: 3.5 mmol/L (ref 3.5–5.1)
Sodium: 140 mmol/L (ref 135–145)

## 2018-08-20 LAB — URINE CULTURE: Culture: NO GROWTH

## 2018-08-20 MED ORDER — ACETAMINOPHEN 500 MG PO TABS
500.0000 mg | ORAL_TABLET | Freq: Three times a day (TID) | ORAL | Status: DC
  Administered 2018-08-20 – 2018-08-22 (×7): 500 mg via ORAL
  Filled 2018-08-20 (×7): qty 1

## 2018-08-20 MED ORDER — AMLODIPINE BESYLATE 5 MG PO TABS
5.0000 mg | ORAL_TABLET | Freq: Every day | ORAL | Status: DC
  Administered 2018-08-21 – 2018-08-22 (×2): 5 mg via ORAL
  Filled 2018-08-20 (×2): qty 1

## 2018-08-20 MED ORDER — OXYCODONE HCL 5 MG PO TABS
5.0000 mg | ORAL_TABLET | Freq: Four times a day (QID) | ORAL | Status: DC | PRN
  Administered 2018-08-20: 5 mg via ORAL
  Filled 2018-08-20: qty 1

## 2018-08-20 MED ORDER — POTASSIUM CHLORIDE CRYS ER 20 MEQ PO TBCR
40.0000 meq | EXTENDED_RELEASE_TABLET | Freq: Once | ORAL | Status: AC
  Administered 2018-08-20: 40 meq via ORAL
  Filled 2018-08-20: qty 2

## 2018-08-20 MED ORDER — LIDOCAINE 5 % EX PTCH
1.0000 | MEDICATED_PATCH | CUTANEOUS | Status: DC
  Administered 2018-08-20 – 2018-08-22 (×3): 1 via TRANSDERMAL
  Filled 2018-08-20 (×3): qty 1

## 2018-08-20 MED ORDER — IBUPROFEN 200 MG PO TABS
400.0000 mg | ORAL_TABLET | Freq: Four times a day (QID) | ORAL | Status: DC | PRN
  Administered 2018-08-20 – 2018-08-21 (×2): 400 mg via ORAL
  Filled 2018-08-20 (×3): qty 2

## 2018-08-20 NOTE — Progress Notes (Signed)
PROGRESS NOTE    [name]  XNA:[id number] DOB: 11/10/1936 DOA: 08/18/2018 PCP: [name], MD    Brief Narrative: Patient 82 year old female history of hypertension, questionable dementia presented to the ED via EMS secondary to altered mental status.  Infectious work-up negative to date.  Patient admitted.  CT head negative.  Patient noted to be dehydrated and placed on IV fluids.   Assessment & Plan:   Principal Problem:   Altered mental state Active Problems:   Hypertension   COPD (chronic obstructive pulmonary disease) (HCC)   Acute encephalopathy   Vitamin B12 deficiency   Dehydration   Tachycardia  #1 acute metabolic encephalopathy Questionable etiology.  Likely secondary to dehydration in the setting of possible underlying dementia.  Patient noted to be on narcotics and muscle relaxants for chronic low back pain in the past at home however patient and son state patient has not been on any narcotic pain medications recently.  Patient improving clinically.  Patient was being very repetitive however once patient was redirected on 08/19/2018, she seemed to answer questions appropriately and following commands.  Patient alert and oriented x2 only.  Continue hydration with IV fluids.  Follow.  2.  COPD Stable.  Continue nebs as needed.  3.  Hypertension Increase Norvasc to 5 mg daily for better blood pressure control.    4.  Dehydration Continue IV fluids.    5.  Acute kidney injury Likely secondary to a prerenal azotemia.  Urinalysis with trace leukocytes, nitrite negative, no bacteria seen, 0-5 WBCs.  Renal function improving with hydration.  Continue IV fluids.  Follow.  6 tachycardia Patient noted to be tachycardiac on 08/19/2018.  Could be secondary to pain as patient with complaints of worsening chronic low back pain.  TSH within normal limits.  Continue IV fluids.   7.  Vitamin B12 deficiency Patient started on vitamin B12 1000 MCG's IM daily x7 days,  and then weekly x1 month, and then monthly.  Outpatient follow-up with PCP.  8.  Acute on chronic low back pain Patient states has had worsening significant low back pain which has essentially made her sedentary with difficulty ambulating and getting out of bed.  Patient states has a prior history of compression fractures and has had what she describes like a kyphoplasty in the past.  Patient denied any lower extremity numbness or weakness.  MRI of the L-spine done 10/12/2016 with acute or subacute burst fracture of L1 causing mild spinal stenosis due to retropulsion of bone into the canal.  Chronic compression fractures T11 and L3, mild spinal stenosis L4-5.  We will get a MRI of the lumbar spine to rule out any new compression fractures.  Oxycodone 5 mg p.o. every 4 hours as needed.  Place a Lidoderm patch.  Follow.   DVT prophylaxis: Lovenox Code Status: Full Family Communication: Updated patient.  No family at bedside. Disposition Plan: Likely back home when clinically improved and back at baseline.   Consultants:   None  Procedures:   CT head 08/18/2018  Chest x-ray 08/18/2018  MRI L-spine pending  Antimicrobials:   None   Subjective: Patient complaining of low back pain which she states has been worsened recently.  Patient concerned she might have another compression fracture.  Patient denies any chest pain.  No shortness of breath.  No abdominal pain.  Mentation improving.    Objective: Vitals:   08/19/18 1016 08/19/18 1419 08/19/18 2159 08/20/18 0513  BP: (!) 158/75 (!) 165/73 (!) 166/74 Marland Kitchen)  152/80  Pulse: 93 (!) 117 86 89  Resp:  20 20 18   Temp:  98.3 F (36.8 C) 98.1 F (36.7 C) 98 F (36.7 C)  TempSrc:   Oral Oral  SpO2:  97% 96% 98%  Weight:        Intake/Output Summary (Last 24 hours) at 08/20/2018 1149 Last data filed at 08/20/2018 0939 Gross per 24 hour  Intake 1620.64 ml  Output 1400 ml  Net 220.64 ml   Filed Weights   08/19/18 0606  Weight: 64.1 kg     Examination:  General exam: NAD Respiratory system: CTAB.  No wheezes, no crackles, no rhonchi.  Normal respiratory effort.   Cardiovascular system: Regular rate and rhythm no murmurs rubs or gallops.  No JVD.  No lower extremity edema.  Gastrointestinal system: Abdomen is soft, nontender, nondistended, positive bowel sounds.  No rebound.  No guarding. Central nervous system: Alert and oriented. No focal neurological deficits. Extremities: Symmetric 5 x 5 power. Skin: No rashes, lesions or ulcers Psychiatry: Judgement and insight appear fair. Mood & affect appropriate.     Data Reviewed: I have personally reviewed following labs and imaging studies  CBC: Recent Labs  Lab 08/18/18 1747 08/19/18 0544 08/20/18 0548  WBC 14.9* 8.7 8.5  NEUTROABS  --   --  6.4  HGB 13.3 10.5* 10.6*  HCT 42.2 33.7* 33.2*  MCV 88.3 87.1 87.1  PLT 478* 378 500   Basic Metabolic Panel: Recent Labs  Lab 08/18/18 1747 08/19/18 0544 08/20/18 0548  NA 140 139 140  K 4.4 3.9 3.5  CL 97* 106 106  CO2 27 24 20*  GLUCOSE 151* 112* 91  BUN 36* 28* 14  CREATININE 1.31* 0.75 0.61  CALCIUM 9.7 8.2* 8.2*   GFR: CrCl cannot be calculated (Unknown ideal weight.). Liver Function Tests: Recent Labs  Lab 08/18/18 1747 08/19/18 0544  AST 24 16  ALT 11 11  ALKPHOS 76 55  BILITOT 1.0 0.8  PROT 8.9* 6.6  ALBUMIN 4.6 3.4*   No results for input(s): LIPASE, AMYLASE in the last 168 hours. Recent Labs  Lab 08/18/18 1935 08/19/18 0836  AMMONIA 40* 18   Coagulation Profile: No results for input(s): INR, PROTIME in the last 168 hours. Cardiac Enzymes: No results for input(s): CKTOTAL, CKMB, CKMBINDEX, TROPONINI in the last 168 hours. BNP (last 3 results) No results for input(s): PROBNP in the last 8760 hours. HbA1C: No results for input(s): HGBA1C in the last 72 hours. CBG: Recent Labs  Lab 08/18/18 1807  GLUCAP 144*   Lipid Profile: No results for input(s): CHOL, HDL, LDLCALC, TRIG,  CHOLHDL, LDLDIRECT in the last 72 hours. Thyroid Function Tests: Recent Labs    08/19/18 1600  TSH 3.182   Anemia Panel: Recent Labs    08/19/18 0836  VITAMINB12 104*  FOLATE 10.0  FERRITIN 50  TIBC 335  IRON 69   Sepsis Labs: Recent Labs  Lab 08/18/18 1754 08/18/18 1944 08/19/18 0836  LATICACIDVEN 4.06* 2.16* 0.7    Recent Results (from the past 240 hour(s))  Blood Culture (routine x 2)     Status: None (Preliminary result)   Collection Time: 08/18/18  5:47 PM  Result Value Ref Range Status   Specimen Description   Final    BLOOD RIGHT ANTECUBITAL Performed at [facility], [place] [address], [place], [place] [address]    Special Requests   Final    BOTTLES DRAWN AEROBIC AND ANAEROBIC Blood Culture adequate volume Performed at  [facility], [place] [address], [place], [place] [address]    Culture   Final    NO GROWTH 2 DAYS Performed at [place] [address], [place], [place] [address]    Report Status PENDING  Incomplete  Urine culture     Status: None   Collection Time: 08/18/18  6:09 PM  Result Value Ref Range Status   Specimen Description   Final    URINE, RANDOM Performed at [facility] [address], [place], [place] [address]    Special Requests   Final    NONE Performed at [facility], [place] [address], [place], [place] [address]    Culture   Final    NO GROWTH Performed at [facility], [place] [address], [place], [place] [address]    Report Status 08/20/2018 FINAL  Final  Blood Culture (routine x 2)     Status: None (Preliminary result)   Collection Time: 08/18/18  6:14 PM  Result Value Ref Range Status   Specimen Description   Final    BLOOD BLOOD LEFT FOREARM Performed at [facility] [place] [address] [place], [place] [address]    Special Requests   Final    BOTTLES DRAWN AEROBIC AND ANAEROBIC Blood Culture results may not be  optimal due to an excessive volume of blood received in culture bottles Performed at [place] [address], [place], [place] [address]    Culture   Final    NO GROWTH 2 DAYS Performed at [place] [address], [place],  [address]    Report Status PENDING  Incomplete         Radiology Studies: Dg Chest 2 View  Result Date: 08/18/2018 CLINICAL DATA:  Altered mental status.  Sepsis. EXAM: CHEST - 2 VIEW COMPARISON:  None. FINDINGS: Lungs are hypoinflated without focal airspace consolidation or effusion. Cardiomediastinal silhouette and remainder of the exam is unchanged. IMPRESSION: Hypoinflation without acute cardiopulmonary disease. Electronically Signed   By: Marin Olp M.D.   On: 08/18/2018 18:46   Ct Head Wo Contrast  Result Date: 08/18/2018 CLINICAL DATA:  Altered mental status. EXAM: CT HEAD WITHOUT CONTRAST TECHNIQUE: Contiguous axial images were obtained from the base of the skull through the vertex without intravenous contrast. COMPARISON:  06/09/2018 FINDINGS: Brain: Ventricles, cisterns and other CSF spaces are normal. Small old lacune infarct over the left lentiform nucleus. No mass, mass effect, shift of midline structures or acute hemorrhage. No evidence of acute infarction. Vascular: No hyperdense vessel or unexpected calcification. Skull: Normal. Negative for fracture or focal lesion. Sinuses/Orbits: Orbits are within normal. There is opacification/mucosal membrane thickening involving the right maxillary sinus unchanged. Other: None. IMPRESSION: No acute findings. Old small left central lacunar infarct. Chronic inflammatory change of the right maxillary sinus. Electronically Signed   By: Marin Olp M.D.   On: 08/18/2018 21:37   Dg Abd 2 Views  Result Date: 08/19/2018 CLINICAL DATA:  Generalized abdominal pain. EXAM: ABDOMEN - 2 VIEW COMPARISON:  Lumbar spine radiographs from 10/10/2016. FINDINGS: Numbering system of the  thoracolumbar spine as per prior. Six non ribbed lumbar vertebrae were noted. Cement augmentation performed at T12, L2, L4 and both sacral ala. Bowel gas pattern is unremarkable. There is no free air. No obstruction. Calcifications project over the left renal shadow compatible with left-sided nephrolithiasis. Pelvic phleboliths are noted bilaterally. IMPRESSION: 1. Nonobstructed, nondistended bowel gas pattern. 2. Left-sided  nephrolithiasis. 3. Six non rib-bearing lumbar vertebrae with numbering system as per prior. Vertebral augmentation has been performed and based on this numbering system, cement noted at T12, L2, L4 and both sacral ala. Electronically Signed   By: Ashley Royalty M.D.   On: 08/19/2018 21:18        Scheduled Meds: . amLODipine  2.5 mg Oral Daily  . cyanocobalamin  1,000 mcg Intramuscular Daily  . DULoxetine  60 mg Oral Daily  . enoxaparin (LOVENOX) injection  40 mg Subcutaneous QHS  . senna  1 tablet Oral BID   Continuous Infusions: . sodium chloride 75 mL/hr at 08/20/18 0829     LOS: 2 days    Time spent: 35 minutes    Irine Seal, MD [facility] Pager [phone]  If 7PM-7AM, please contact night-coverage [email or web address] Password [facility] 08/20/2018, 11:49 AM

## 2018-08-20 NOTE — Progress Notes (Signed)
Patient requesting ibuprofen for back pain.  MD notified via text page.

## 2018-08-21 DIAGNOSIS — J431 Panlobular emphysema: Secondary | ICD-10-CM | POA: Diagnosis not present

## 2018-08-21 DIAGNOSIS — R4182 Altered mental status, unspecified: Secondary | ICD-10-CM | POA: Diagnosis not present

## 2018-08-21 DIAGNOSIS — E860 Dehydration: Secondary | ICD-10-CM | POA: Diagnosis not present

## 2018-08-21 DIAGNOSIS — G9340 Encephalopathy, unspecified: Secondary | ICD-10-CM | POA: Diagnosis not present

## 2018-08-21 DIAGNOSIS — I10 Essential (primary) hypertension: Secondary | ICD-10-CM | POA: Diagnosis not present

## 2018-08-21 DIAGNOSIS — N179 Acute kidney failure, unspecified: Secondary | ICD-10-CM | POA: Diagnosis not present

## 2018-08-21 LAB — BASIC METABOLIC PANEL
Anion gap: 10 (ref 5–15)
BUN: 10 mg/dL (ref 8–23)
CALCIUM: 8.5 mg/dL — AB (ref 8.9–10.3)
CO2: 24 mmol/L (ref 22–32)
CREATININE: 0.56 mg/dL (ref 0.44–1.00)
Chloride: 106 mmol/L (ref 98–111)
GFR calc Af Amer: >60 mL/min (ref >60)
GFR calc non Af Amer: >60 mL/min (ref >60)
Glucose, Bld: 107 mg/dL — ABNORMAL HIGH (ref 70–99)
Potassium: 3.6 mmol/L (ref 3.5–5.1)
SODIUM: 140 mmol/L (ref 135–145)

## 2018-08-21 LAB — CBC
HCT: 34.9 % — ABNORMAL LOW (ref 36.0–46.0)
Hemoglobin: 11.2 g/dL — ABNORMAL LOW (ref 12.0–15.0)
MCH: 27.6 pg (ref 26.0–34.0)
MCHC: 32.1 g/dL (ref 30.0–36.0)
MCV: 86 fL (ref 78.0–100.0)
Platelets: 345 10*3/uL (ref 150–400)
RBC: 4.06 MIL/uL (ref 3.87–5.11)
RDW: 14.2 % (ref 11.5–15.5)
WBC: 7.4 10*3/uL (ref 4.0–10.5)

## 2018-08-21 MED ORDER — OXYCODONE HCL 5 MG PO TABS
2.5000 mg | ORAL_TABLET | Freq: Four times a day (QID) | ORAL | Status: DC | PRN
  Administered 2018-08-21 – 2018-08-22 (×4): 5 mg via ORAL
  Filled 2018-08-21 (×4): qty 1

## 2018-08-21 MED ORDER — POTASSIUM CHLORIDE CRYS ER 20 MEQ PO TBCR: 40.0000 meq | EXTENDED_RELEASE_TABLET | Freq: Once | ORAL | Status: DC

## 2018-08-21 MED ORDER — RISPERIDONE 0.25 MG PO TABS
0.5000 mg | ORAL_TABLET | Freq: Once | ORAL | Status: AC
  Administered 2018-08-21: 0.5 mg via ORAL
  Filled 2018-08-21: qty 2

## 2018-08-21 NOTE — Evaluation (Signed)
Physical Therapy Evaluation Patient Details Name: [name] MRN: [id number] DOB: 07-Jan-1936 Today's Date: 08/21/2018   History of Present Illness  Pt was admitted for AMS. 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  Clinical Impression  Worked with OT during the session due to patient was anxious and felt more comfortable trying to move for the first time with more than one person. Pt with decreased strength, balance and tolerance to activity to benefit from continued PT to increase mobility and  Safety.     Follow Up Recommendations SNF    Equipment Recommendations  None recommended by PT    Recommendations for Other Services       Precautions / Restrictions Precautions Precautions: Fall Precaution Comments: pt was initially anxious when OT arrived, saying "I don't know you; I've never met you"   Restrictions Weight Bearing Restrictions: No      Mobility  Bed Mobility Overal bed mobility: Needs Assistance Bed Mobility: Supine to Sit     Supine to sit: Mod assist;+2 for physical assistance     General bed mobility comments: assist for trunk; use of pad to scoot to EOB  Transfers Overall transfer level: Needs assistance Equipment used: Rolling walker (2 wheeled) Transfers: Sit to/from [facility] Sit to Stand: Min assist;+2 safety/equipment Stand pivot transfers: Min assist;+2 safety/equipment       General transfer comment: pt fearful.  Assist to rise and steady.  Pt pulled up on RW and therapist stabilized.  Encouragement to continue turning during SPT. tolerated well, limited a lot by fear and some weakness definietley present;.   Ambulation/Gait Ambulation/Gait assistance: Min assist;+2 safety/equipment Gait Distance (Feet): 5 Feet Assistive device: Rolling walker (2 wheeled)       General Gait Details: took about 5 steps with pivot to transfer to the reclinwer  Stairs            Wheelchair Mobility    Modified Rankin  (Stroke Patients Only)       Balance Overall balance assessment: Needs assistance   Sitting balance-Leahy Scale: Fair       Standing balance-Leahy Scale: Poor                               Pertinent Vitals/Pain Pain Assessment: Faces Faces Pain Scale: Hurts a little bit Pain Location: back, right upper lumbar area  Pain Descriptors / Indicators: Discomfort;Aching Pain Intervention(s): Limited activity within patient's tolerance    Home Living Family/patient expects to be discharged to:: Private residence Living Arrangements: Children Available Help at Discharge: Available 24 hours/day Type of Home: House Home Access: Stairs to enter Entrance Stairs-Rails: Right Entrance Stairs-Number of Steps: 2   Home Equipment: [place] - 2 wheels;Cane - single point;Bedside commode Additional Comments: pt states son was just incarcerated . Pt states he has a substance abuse problem     Prior Function Level of Independence: Needs assistance   Gait / Transfers Assistance Needed: pt states she "holds on to furniture and things in her house because when she is on her feet her back pain is worse"      Comments: pt states she does all BADLs and son helps with IADLs     Hand Dominance        Extremity/Trunk Assessment   Upper Extremity Assessment Upper Extremity Assessment: Generalized weakness;LUE deficits/detail LUE Deficits / Details: index finger doesn't flex entirely; ? 4th trigger finger    Lower Extremity Assessment  Lower Extremity Assessment: Generalized weakness(tight DF b ankles and wekaness in knee extensiona dn hip flexion. )       Communication   Communication: No difficulties  Cognition Arousal/Alertness: Awake/alert Behavior During Therapy: Anxious(initially, then WFLs) Overall Cognitive Status: No family/caregiver present to determine baseline cognitive functioning                                 General Comments: per chart,  questionnable dementia      General Comments      Exercises     Assessment/Plan    PT Assessment Patient needs continued PT services  PT Problem List Decreased strength;Decreased mobility;Decreased activity tolerance       PT Treatment Interventions DME instruction;Functional mobility training;Gait training;Therapeutic activities;Therapeutic exercise;Patient/family education    PT Goals (Current goals can be found in the Care Plan section)  Acute Rehab PT Goals Patient Stated Goal: none stated; agreeable to working with therapy PT Goal Formulation: With patient Time For Goal Achievement: 09/04/18    Frequency Min 3X/week   Barriers to discharge        Co-evaluation PT/OT/SLP Co-Evaluation/Treatment: Yes Reason for Co-Treatment: Necessary to address cognition/behavior during functional activity PT goals addressed during session: Mobility/safety with mobility         AM-PAC PT "6 Clicks" Daily Activity  Outcome Measure Difficulty turning over in bed (including adjusting bedclothes, sheets and blankets)?: Unable Difficulty moving from lying on back to sitting on the side of the bed? : Unable Difficulty sitting down on and standing up from a chair with arms (e.g., wheelchair, bedside commode, etc,.)?: Unable Help needed moving to and from a bed to chair (including a wheelchair)?: A Little Help needed walking in hospital room?: A Lot Help needed climbing 3-5 steps with a railing? : Total 6 Click Score: 9    End of Session   Activity Tolerance: Patient tolerated treatment well;Other (comment)(limited some by fear of falling and anxious ) Patient left: in chair;with call bell/phone within reach;with chair alarm set Nurse Communication: Mobility status PT Visit Diagnosis: Unsteadiness on feet (R26.81);Muscle weakness (generalized) (M62.81)    Time: 1000-1034 PT Time Calculation (min) (ACUTE ONLY): 34 min   Charges:   PT Evaluation $PT Eval Low Complexity: 1 Low           Clide Dales, PT Pager: [phone] 08/21/2018   Tedd Cottrill, Gatha Mayer 08/21/2018, 1:35 PM

## 2018-08-21 NOTE — Progress Notes (Addendum)
Pt. Is A&Ox2,this morning when vitals were taken she was refusing by stating that ''you are planning to lock me up in mental institution''.This nurse explained and pt. Agrees vitals  taken.Pt. B/P was high and ordered  prn med.was given.Eventualy pt.refused phlebotomist  to take blood sample and refused to recheck BP.

## 2018-08-21 NOTE — Progress Notes (Signed)
PROGRESS NOTE    [name]  GLO:[id number] DOB: 10/18/1936 DOA: 08/18/2018 PCP: [name], MD    Brief Narrative: Patient 82 year old female history of hypertension, questionable dementia presented to the ED via EMS secondary to altered mental status.  Infectious work-up negative to date.  Patient admitted.  CT head negative.  Patient noted to be dehydrated and placed on IV fluids.   Assessment & Plan:   Principal Problem:   Altered mental state Active Problems:   Hypertension   COPD (chronic obstructive pulmonary disease) (HCC)   Acute encephalopathy   Vitamin B12 deficiency   Dehydration   Tachycardia  #1 acute metabolic encephalopathy Questionable etiology.  Likely secondary to dehydration in the setting of possible underlying dementia and probable vitamin B12 deficiency.  Patient noted to be on narcotics and muscle relaxants for chronic low back pain in the past at home however patient and son state patient has not been on any narcotic pain medications recently.  Patient improving clinically.  Patient was being very repetitive however once patient was redirected on 08/19/2018, she seemed to answer questions appropriately and following commands.  Patient noted to be agitated overnight with some confusion and per RN was calling 911.  When patient was assessed and spoken to she answers questions appropriately and alluded to the fact that she is under a lot of family stress at this time.  Patient also with complaints of back pain not fully controlled.  Continue gentle hydration, vitamin B12 supplementation, pain management.  Follow.  2.  COPD Stable.  Continue nebs as needed.  3.  Hypertension Improved on Norvasc 5 mg daily.    4.  Dehydration Continue IV fluids.  Follow.   5.  Acute kidney injury Likely secondary to a prerenal azotemia.  Urinalysis with trace leukocytes, nitrite negative, no bacteria seen, 0-5 WBCs.  Renal function improving with hydration.   Continue IV fluids.  Follow.  6 tachycardia Patient noted to be tachycardiac on 08/19/2018.  Could be secondary to pain as patient with complaints of worsening chronic low back pain.  TSH within normal limits.  Tachycardia improved.  Continue IV fluids.   7.  Vitamin B12 deficiency Patient started on vitamin B12 1000 MCG's IM daily x7 days, and then weekly x1 month, and then monthly.  Outpatient follow-up with PCP.  8.  Acute on chronic low back pain Patient states has had worsening significant low back pain which has essentially made her sedentary with difficulty ambulating and getting out of bed.  Patient states has a prior history of compression fractures and has had what she describes like a kyphoplasty in the past.  Patient denied any lower extremity numbness or weakness.  MRI of the L-spine done 10/12/2016 with acute or subacute burst fracture of L1 causing mild spinal stenosis due to retropulsion of bone into the canal.  Chronic compression fractures T11 and L3, mild spinal stenosis L4-5.  MRI of the lumbar spine to rule out any new compression fractures pending.  Continue Lidoderm patch.  Change oxycodone to 2.5 to 5 mg every 6 hours as needed pain. Follow.   DVT prophylaxis: Lovenox Code Status: Full Family Communication: Updated patient.  No family at bedside. Disposition Plan: Likely back home when clinically improved and back at baseline.   Consultants:   None  Procedures:   CT head 08/18/2018  Chest x-ray 08/18/2018  MRI L-spine pending.  Antimicrobials:   None   Subjective: Patient sitting up in chair.  Patient noted to  be agitated overnight and early this morning.  Patient states complains of low back pain and states has been under a lot of stress.  It was noted that patient was calling 911 last night and this morning.  Patient complaining of some family stresses.  No chest pain.  No shortness of breath.     Objective: Vitals:   08/20/18 1454 08/20/18 2024 08/21/18  0500 08/21/18 0750  BP:  (!) 157/61 (!) 202/90 (!) 167/66  Pulse:  85 93 98  Resp:  (!) 22 (!) 22   Temp:  97.7 F (36.5 C)    TempSrc:  Oral    SpO2:  96% 92%   Weight:      Height: 4\' 10"  (1.473 m)       Intake/Output Summary (Last 24 hours) at 08/21/2018 1222 Last data filed at 08/21/2018 1000 Gross per 24 hour  Intake 892.14 ml  Output 1500 ml  Net -607.86 ml   Filed Weights   08/19/18 0606  Weight: 64.1 kg    Examination:  General exam: NAD Respiratory system: Lungs clear to auscultation bilaterally.  No wheezes, no crackles, no rhonchi.  Normal respiratory effort.  Cardiovascular system: RRR no murmurs rubs or gallops.  No JVD.  No lower extremity edema.   Gastrointestinal system: Abdomen is nontender, nondistended, soft, positive bowel sounds.  No rebound.  No guarding.   Central nervous system: Alert and oriented. No focal neurological deficits. Extremities: Symmetric 5 x 5 power. Skin: No rashes, lesions or ulcers Psychiatry: Judgement and insight appear fair. Mood & affect appropriate.     Data Reviewed: I have personally reviewed following labs and imaging studies  CBC: Recent Labs  Lab 08/18/18 1747 08/19/18 0544 08/20/18 0548 08/21/18 0818  WBC 14.9* 8.7 8.5 7.4  NEUTROABS  --   --  6.4  --   HGB 13.3 10.5* 10.6* 11.2*  HCT 42.2 33.7* 33.2* 34.9*  MCV 88.3 87.1 87.1 86.0  PLT 478* 378 345 433   Basic Metabolic Panel: Recent Labs  Lab 08/18/18 1747 08/19/18 0544 08/20/18 0548 08/21/18 0818  NA 140 139 140 140  K 4.4 3.9 3.5 3.6  CL 97* 106 106 106  CO2 27 24 20* 24  GLUCOSE 151* 112* 91 107*  BUN 36* 28* 14 10  CREATININE 1.31* 0.75 0.61 0.56  CALCIUM 9.7 8.2* 8.2* 8.5*   GFR: Estimated Creatinine Clearance: 45.2 mL/min (by C-G formula based on SCr of 0.56 mg/dL). Liver Function Tests: Recent Labs  Lab 08/18/18 1747 08/19/18 0544  AST 24 16  ALT 11 11  ALKPHOS 76 55  BILITOT 1.0 0.8  PROT 8.9* 6.6  ALBUMIN 4.6 3.4*   No  results for input(s): LIPASE, AMYLASE in the last 168 hours. Recent Labs  Lab 08/18/18 1935 08/19/18 0836  AMMONIA 40* 18   Coagulation Profile: No results for input(s): INR, PROTIME in the last 168 hours. Cardiac Enzymes: No results for input(s): CKTOTAL, CKMB, CKMBINDEX, TROPONINI in the last 168 hours. BNP (last 3 results) No results for input(s): PROBNP in the last 8760 hours. HbA1C: No results for input(s): HGBA1C in the last 72 hours. CBG: Recent Labs  Lab 08/18/18 1807  GLUCAP 144*   Lipid Profile: No results for input(s): CHOL, HDL, LDLCALC, TRIG, CHOLHDL, LDLDIRECT in the last 72 hours. Thyroid Function Tests: Recent Labs    08/19/18 1600  TSH 3.182   Anemia Panel: Recent Labs    08/19/18 0836  VITAMINB12 104*  FOLATE 10.0  FERRITIN  50  TIBC 335  IRON 69   Sepsis Labs: Recent Labs  Lab 08/18/18 1754 08/18/18 1944 08/19/18 0836  LATICACIDVEN 4.06* 2.16* 0.7    Recent Results (from the past 240 hour(s))  Blood Culture (routine x 2)     Status: None (Preliminary result)   Collection Time: 08/18/18  5:47 PM  Result Value Ref Range Status   Specimen Description   Final    BLOOD RIGHT ANTECUBITAL Performed at [facility], [place] [address], [place], [place] [address]    Special Requests   Final    BOTTLES DRAWN AEROBIC AND ANAEROBIC Blood Culture adequate volume Performed at [facility] [address], [place], [place] [address]    Culture   Final    NO GROWTH 2 DAYS Performed at [facility] [address] [place], [place] [address]    Report Status PENDING  Incomplete  Urine culture     Status: None   Collection Time: 08/18/18  6:09 PM  Result Value Ref Range Status   Specimen Description   Final    URINE, RANDOM Performed at [facility] [address], [place], [place] [address]    Special Requests   Final    NONE Performed at [facility], [place]  [address], [place], [place] [address]    Culture   Final    NO GROWTH Performed at [facility], [place] [address] [place], [place] [address]    Report Status 08/20/2018 FINAL  Final  Blood Culture (routine x 2)     Status: None (Preliminary result)   Collection Time: 08/18/18  6:14 PM  Result Value Ref Range Status   Specimen Description   Final    BLOOD BLOOD LEFT FOREARM Performed at [facility] [address], [place], [place] [address]    Special Requests   Final    BOTTLES DRAWN AEROBIC AND ANAEROBIC Blood Culture results may not be optimal due to an excessive volume of blood received in culture bottles Performed at [facility] [address], [place], [place] [address]    Culture   Final    NO GROWTH 2 DAYS Performed at [facility] [address] [place],  [address]    Report Status PENDING  Incomplete         Radiology Studies: Dg Abd 2 Views  Result Date: 08/19/2018 CLINICAL DATA:  Generalized abdominal pain. EXAM: ABDOMEN - 2 VIEW COMPARISON:  Lumbar spine radiographs from 10/10/2016. FINDINGS: Numbering system of the thoracolumbar spine as per prior. Six non ribbed lumbar vertebrae were noted. Cement augmentation performed at T12, L2, L4 and both sacral ala. Bowel gas pattern is unremarkable. There is no free air. No obstruction. Calcifications project over the left renal shadow compatible with left-sided nephrolithiasis. Pelvic phleboliths are noted bilaterally. IMPRESSION: 1. Nonobstructed, nondistended bowel gas pattern. 2. Left-sided nephrolithiasis. 3. Six non rib-bearing lumbar vertebrae with numbering system as per prior. Vertebral augmentation has been performed and based on this numbering system, cement noted at T12, L2, L4 and both sacral ala. Electronically Signed   By: Ashley Royalty M.D.   On: 08/19/2018 21:18        Scheduled Meds: . acetaminophen  500 mg Oral TID  . amLODipine  5 mg Oral Daily    . cyanocobalamin  1,000 mcg Intramuscular Daily  . DULoxetine  60 mg Oral Daily  . enoxaparin (LOVENOX) injection  40 mg Subcutaneous QHS  . lidocaine  1 patch Transdermal Q24H  . senna  1 tablet Oral BID   Continuous Infusions: . sodium chloride 75 mL/hr at 08/21/18 1131     LOS: 3 days    Time spent: 35 minutes    Irine Seal, MD [facility] Pager [phone]  If 7PM-7AM, please contact night-coverage [email or web address] Password [facility] 08/21/2018, 12:22 PM

## 2018-08-21 NOTE — Evaluation (Signed)
Occupational Therapy Evaluation Patient Details Name: [name] MRN: [id number] DOB: 1936-02-16 Today's Date: 08/21/2018    History of Present Illness Pt was admitted for AMS. 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   Clinical Impression   This 82 year old female was admitted for the above. She reports that she is mod independent at baseline for adls; she furniture walks or uses RW.  Per chart, son has substance abuse problems and she reports he is incarcerated.  Pt would benefit from continued OT both in acute setting and at SNF. Goals in acute setting are for min guard to min A    Follow Up Recommendations  SNF    Equipment Recommendations  3 in 1 bedside commode    Recommendations for Other Services       Precautions / Restrictions Precautions Precautions: Fall Precaution Comments: pt was initially anxious when OT arrived, saying "I don't know you; I've never met you"   Restrictions Weight Bearing Restrictions: No      Mobility Bed Mobility Overal bed mobility: Needs Assistance Bed Mobility: Supine to Sit     Supine to sit: Mod assist;+2 for physical assistance     General bed mobility comments: assist for trunk; use of pad to scoot to EOB  Transfers Overall transfer level: Needs assistance Equipment used: Rolling walker (2 wheeled) Transfers: Sit to/from [facility] Sit to Stand: Min assist;+2 safety/equipment Stand pivot transfers: Min assist;+2 safety/equipment       General transfer comment: pt fearful.  Assist to rise and steady.  Pt pulled up on RW and therapist stabilized.  Encouragement to continue turning during SPT    Balance Overall balance assessment: Needs assistance   Sitting balance-Leahy Scale: Fair       Standing balance-Leahy Scale: Poor                             ADL either performed or assessed with clinical judgement   ADL Overall ADL's : Needs assistance/impaired Eating/Feeding: Set up    Grooming: Oral care;Minimal assistance;Sitting   Upper Body Bathing: Minimal assistance;Sitting   Lower Body Bathing: Moderate assistance;Sit to/from stand   Upper Body Dressing : Sitting;Set up   Lower Body Dressing: (doesn't wear LB clothing)   Toilet Transfer: Minimal assistance;+2 for safety/equipment;Stand-pivot;RW(chair)             General ADL Comments: sat EOB for grooming then transferred to chair     Vision         Perception     Praxis      Pertinent Vitals/Pain Pain Assessment: Faces Faces Pain Scale: Hurts a little bit Pain Location: back Pain Descriptors / Indicators: Discomfort Pain Intervention(s): Limited activity within patient's tolerance;Repositioned     Hand Dominance     Extremity/Trunk Assessment Upper Extremity Assessment Upper Extremity Assessment: Generalized weakness;LUE deficits/detail LUE Deficits / Details: index finger doesn't flex entirely; ? 4th trigger finger           Communication Communication Communication: No difficulties   Cognition Arousal/Alertness: Awake/alert Behavior During Therapy: Anxious(initially, then WFLs)                                   General Comments: per chart, questionnable dementia   General Comments       Exercises     Shoulder Instructions      Home  Living Family/patient expects to be discharged to:: Private residence Living Arrangements: Children                 Bathroom Shower/Tub: Teacher, early years/pre: Standard         Additional Comments: pt states son was just incarcerated      Prior Functioning/Environment          Comments: pt states she does all BADLs and son helps with IADLs        OT Problem List: Decreased strength;Decreased activity tolerance;Impaired balance (sitting and/or standing);Decreased knowledge of use of DME or AE;Pain      OT Treatment/Interventions: Self-care/ADL training;DME and/or AE instruction;Balance  training;Patient/family education;Therapeutic activities    OT Goals(Current goals can be found in the care plan section) Acute Rehab OT Goals Patient Stated Goal: none stated; agreeable to working with therapy OT Goal Formulation: With patient Time For Goal Achievement: 09/04/18 Potential to Achieve Goals: Good ADL Goals Pt Will Transfer to Toilet: with min guard assist;bedside commode;stand pivot transfer Pt Will Perform Toileting - Clothing Manipulation and hygiene: with min assist;sit to/from stand Additional ADL Goal #1: pt will perform bed mobility with min A in preparation for adls Additional ADL Goal #2: pt will perform 2 sets of 5 AROM exercises to increase strength for adls  OT Frequency: Min 2X/week   Barriers to D/C:            Co-evaluation              AM-PAC PT "6 Clicks" Daily Activity     Outcome Measure Help from another person eating meals?: A Little Help from another person taking care of personal grooming?: A Little Help from another person toileting, which includes using toliet, bedpan, or urinal?: A Lot Help from another person bathing (including washing, rinsing, drying)?: A Lot Help from another person to put on and taking off regular upper body clothing?: A Little Help from another person to put on and taking off regular lower body clothing?: A Lot 6 Click Score: 15   End of Session Nurse Communication: Mobility status  Activity Tolerance: Patient tolerated treatment well Patient left: in chair;with call bell/phone within reach;with chair alarm set  OT Visit Diagnosis: Unsteadiness on feet (R26.81);Muscle weakness (generalized) (M62.81)                Time: 8251-8984 OT Time Calculation (min): 39 min Charges:  OT General Charges $OT Visit: 1 Visit OT Evaluation $OT Eval Low Complexity: 1 Low OT Treatments $Self Care/Home Management : 8-22 mins  Lesle Chris, OTR/L [phone] 08/21/2018  Dennise Raabe 08/21/2018, 12:01 PM

## 2018-08-22 ENCOUNTER — Inpatient Hospital Stay (HOSPITAL_COMMUNITY): Payer: Medicare Other

## 2018-08-22 ENCOUNTER — Encounter (HOSPITAL_COMMUNITY): Payer: Self-pay | Admitting: Internal Medicine

## 2018-08-22 DIAGNOSIS — G9340 Encephalopathy, unspecified: Secondary | ICD-10-CM | POA: Diagnosis present

## 2018-08-22 DIAGNOSIS — G8929 Other chronic pain: Secondary | ICD-10-CM

## 2018-08-22 DIAGNOSIS — S32010D Wedge compression fracture of first lumbar vertebra, subsequent encounter for fracture with routine healing: Secondary | ICD-10-CM

## 2018-08-22 DIAGNOSIS — E860 Dehydration: Secondary | ICD-10-CM | POA: Diagnosis not present

## 2018-08-22 DIAGNOSIS — M549 Dorsalgia, unspecified: Secondary | ICD-10-CM

## 2018-08-22 DIAGNOSIS — N179 Acute kidney failure, unspecified: Secondary | ICD-10-CM | POA: Diagnosis not present

## 2018-08-22 DIAGNOSIS — R4182 Altered mental status, unspecified: Secondary | ICD-10-CM | POA: Diagnosis not present

## 2018-08-22 DIAGNOSIS — M545 Low back pain: Secondary | ICD-10-CM | POA: Diagnosis not present

## 2018-08-22 HISTORY — DX: Other chronic pain: G89.29

## 2018-08-22 LAB — BASIC METABOLIC PANEL
ANION GAP: 8 (ref 5–15)
BUN: 10 mg/dL (ref 8–23)
CO2: 28 mmol/L (ref 22–32)
Calcium: 8.8 mg/dL — ABNORMAL LOW (ref 8.9–10.3)
Chloride: 106 mmol/L (ref 98–111)
Creatinine, Ser: 0.61 mg/dL (ref 0.44–1.00)
GLUCOSE: 107 mg/dL — AB (ref 70–99)
POTASSIUM: 3.5 mmol/L (ref 3.5–5.1)
Sodium: 142 mmol/L (ref 135–145)

## 2018-08-22 MED ORDER — SENNA 8.6 MG PO TABS: 1.0000 | ORAL_TABLET | Freq: Two times a day (BID) | ORAL | 0 refills | Status: DC

## 2018-08-22 MED ORDER — "SYRINGE 22G X 3/4"" 3 ML MISC": 1000.0000 ug | Freq: Every day | 0 refills | Status: DC

## 2018-08-22 MED ORDER — AMLODIPINE BESYLATE 5 MG PO TABS: 5.0000 mg | ORAL_TABLET | Freq: Every day | ORAL | 0 refills | Status: DC

## 2018-08-22 MED ORDER — ACETAMINOPHEN 500 MG PO TABS: 500.0000 mg | ORAL_TABLET | Freq: Three times a day (TID) | ORAL | 0 refills | Status: DC

## 2018-08-22 MED ORDER — GADOBENATE DIMEGLUMINE 529 MG/ML IV SOLN
15.0000 mL | Freq: Once | INTRAVENOUS | Status: AC | PRN
  Administered 2018-08-22: 13 mL via INTRAVENOUS

## 2018-08-22 MED ORDER — LIDOCAINE 5 % EX PTCH: 1.0000 | MEDICATED_PATCH | CUTANEOUS | 0 refills | Status: DC

## 2018-08-22 MED ORDER — CYANOCOBALAMIN 1000 MCG/ML IJ SOLN: 1000.0000 ug | Freq: Every day | INTRAMUSCULAR | 0 refills | Status: DC

## 2018-08-22 MED ORDER — OXYCODONE HCL 5 MG PO TABS: 5.0000 mg | ORAL_TABLET | Freq: Four times a day (QID) | ORAL | 0 refills | Status: DC | PRN

## 2018-08-22 NOTE — Progress Notes (Signed)
[name] to be D/C'd Home with home health per MD order.  Discussed prescriptions and follow up appointments with the patient. Prescriptions given to patient, medication list explained in detail. Pt verbalized understanding.  Allergies as of 08/22/2018      Reactions   Ambien [zolpidem] Other (See Comments)   Hallucinations and paranoid      Medication List    STOP taking these medications   cefpodoxime 200 MG tablet Commonly known as:  VANTIN   lisinopril-hydrochlorothiazide 10-12.5 MG tablet Commonly known as:  PRINZIDE,ZESTORETIC   methocarbamol 500 MG tablet Commonly known as:  ROBAXIN   oxyCODONE-acetaminophen 5-325 MG tablet Commonly known as:  PERCOCET/ROXICET     TAKE these medications   acetaminophen 500 MG tablet Commonly known as:  TYLENOL Take 1 tablet (500 mg total) by mouth 3 (three) times daily.   albuterol 108 (90 Base) MCG/ACT inhaler Commonly known as:  PROVENTIL HFA;VENTOLIN HFA Inhale 1-2 puffs into the lungs every 4 (four) hours as needed for wheezing or shortness of breath.   amLODipine 5 MG tablet Commonly known as:  NORVASC Take 1 tablet (5 mg total) by mouth daily. What changed:    medication strength  how much to take   cyanocobalamin 1000 MCG/ML injection Commonly known as:  (VITAMIN B-12) Inject 1 mL (1,000 mcg total) into the muscle daily. Take 1037mcg IM daily x 4 days, then weekly x 1 month, then monthly Start taking on:  08/23/2018   docusate sodium 250 MG capsule Commonly known as:  COLACE Take 1 capsule (250 mg total) by mouth daily.   DULoxetine 30 MG capsule Commonly known as:  CYMBALTA Take 1 capsule (30 mg total) by mouth daily for 14 days.   DULoxetine 30 MG capsule Commonly known as:  CYMBALTA Take 1 capsule by mouth as directed. 1 capsule daily for 1 week, increase to 2 capsules therafter   gabapentin 100 MG capsule Commonly known as:  NEURONTIN Take 100-300 mg by mouth at bedtime as needed for pain.    lidocaine 5 % Commonly known as:  LIDODERM Place 1 patch onto the skin daily. Remove & Discard patch within 12 hours or as directed by MD Start taking on:  08/23/2018   oxyCODONE 5 MG immediate release tablet Commonly known as:  Oxy IR/ROXICODONE Take 1 tablet (5 mg total) by mouth every 6 (six) hours as needed for breakthrough pain.   polyethylene glycol packet Commonly known as:  MIRALAX / GLYCOLAX Take 17 g by mouth daily as needed for mild constipation.   senna 8.6 MG Tabs tablet Commonly known as:  SENOKOT Take 1 tablet (8.6 mg total) by mouth 2 (two) times daily.   SYRINGE 3CC/22GX3/4" 22G X 3/4" 3 ML Misc 1,000 mcg by Does not apply route daily.            Durable Medical Equipment  (From admission, onward)         Start     Ordered   08/22/18 0833  For home use only DME 3 n 1  Once     08/22/18 0832          Vitals:   08/22/18 1443 08/22/18 1448  BP: (!) 160/81 124/77  Pulse: 91 76  Resp: 16 14  Temp: 98.7 F (37.1 C) 98.1 F (36.7 C)  SpO2: 100% 96%    Skin clean, dry and intact without evidence of skin break down, no evidence of skin tears noted. IV catheter discontinued intact. Site without  signs and symptoms of complications. Dressing and pressure applied. Pt denies pain at this time. No complaints noted.  An After Visit Summary was printed and given to the patient. Patient escorted via [place], and D/C home via private auto.  Lolita Rieger 08/22/2018 5:23 PM

## 2018-08-22 NOTE — Discharge Summary (Signed)
Physician Discharge Summary  [name] NWG:[id number] DOB: May 06, 1936 DOA: 08/18/2018  PCP: Adriana Downing, MD  Admit date: 08/18/2018 Discharge date: 08/22/2018  Time spent: 65 minutes  Recommendations for Outpatient Follow-up:  1. Follow-up with Adriana Downing, MD in 2 weeks.  On follow-up patient will need a basic metabolic profile done to follow-up on electrolytes and renal function.  Patient's blood pressure need to be reassessed.  Patient also discharged on vitamin B12 supplementation and this will need to be followed up upon in the outpatient setting.  Patient blood pressure will need to be reassessed at that time. 2. Patient will be discharged home with home health therapies.   Discharge Diagnoses:  Principal Problem:   Acute encephalopathy Active Problems:   Hypertension   COPD (chronic obstructive pulmonary disease) (HCC)   Closed compression fracture of L1 vertebra (HCC)   Altered mental state   Vitamin B12 deficiency   Dehydration   Tachycardia   Encephalopathy acute   Chronic back pain   Discharge Condition: Stable and improved  Diet recommendation: Regular  Filed Weights   08/19/18 0606  Weight: 64.1 kg    History of present illness:  Per [name] is a 82 y.o. female with medical history significant of hypertension and questionable dementia who was brought in by EMS with altered mental status from home.  Patient was confused and not able to give any history.  She was oriented to self only.  She has a son who is not available.  He himself has issues with polysubstance abuse apparently and was therefore not reliable.  No prior psychiatric history.  There was not that the nurses communicated with the son who said mother was doing fine when he left work early this morning but by 4 PM she was altered.  She was previously able to participate in her ADLs and take care of herself.  She has history of COPD but not on home oxygen.  Patient was  noted to have osteoporosis and chronic back pain for which she takes narcotics and other medications.  Initial work-up in the ER showed no obvious cause of her confusion.  ED Course: Patient is afebrile with a temperature 98.2 but blood pressure 173/76 pulse 140 respiratory of 21 oxygen sat 92% room air.  She has a white count of 15,000 hemoglobin 13.3 and platelet 478.  Sodium 140 with potassium 4.4, chloride is 97 CO2 27 BUN is 36 creatinine 1.31.  Glucose is 151 and lactic acid 2.16.  Head CT without contrast is negative urinalysis is negative and chest x-ray is negative.  She has an ABG showed normal pH 7.36 PCO2 45 this is venous blood.  Hospital Course:  1 acute metabolic encephalopathy Questionable etiology.  Likely secondary to dehydration in the setting of possible underlying dementia and probable vitamin B12 deficiency.  Patient noted to be on narcotics and muscle relaxants for chronic low back pain in the past at home however patient and son state patient has not been on any narcotic pain medications recently.  Patient was admitted hydrated with IV fluids with clinical improvement.  Infectious work-up negative to date. Patient was being very repetitive however once patient was redirected on 08/19/2018, she seemed to answer questions appropriately and following commands.  Patient noted to be agitated during the hospitalization with some confusion and per RN was calling 911.  When patient was assessed and spoken to she answers questions appropriately and alluded to the fact that she is  under a lot of family stress at this time.  Patient also with complaints of back pain not fully controlled.    Patient hydrated, patient received vitamin B12 supplementation via IM and pain was managed with clinical improvement.  Patient was back to baseline by day of discharge.    2.  COPD Stable.  Patient placed on nebs as needed.   3.  Hypertension Patient's ACE inhibitor/diuretic as well as Norvasc was  initially held on admission due to dehydration and acute kidney injury.  Patient was hydrated with IV fluids.  Patient improved clinically and was subsequently started back on Norvasc 2.5 mg daily and dose increased to 5 mg daily for better blood pressure control.  Patient's ACE inhibitor and diuretic subsequently discontinued on discharge.  Outpatient follow-up with PCP.   4.  Dehydration Patient placed on IV fluids and was euvolemic by day of discharge.   5.  Acute kidney injury Likely secondary to a prerenal azotemia.  Urinalysis with trace leukocytes, nitrite negative, no bacteria seen, 0-5 WBCs.  Renal function improved with hydration.  Outpatient follow-up with PCP.  6 tachycardia Patient noted to be tachycardiac on 08/19/2018.  Likely secondary to pain as patient with complaints of worsening chronic low back pain.  TSH within normal limits.  Tachycardia improved.    7.  Vitamin B12 deficiency Patient started on vitamin B12 1000 MCG's IM daily as patient was noted to have a vitamin B12 deficiency with a B12 level of 104.  Patient will be discharged home on vitamin B12 1000 MCG's IM daily x4 days, and then weekly x1 month, and then monthly.  Outpatient follow-up with PCP.    8.  Acute on chronic low back pain Patient states has had worsening significant low back pain which has essentially made her sedentary with difficulty ambulating and getting out of bed.  Patient states has a prior history of compression fractures and has had what she describes like a kyphoplasty in the past.  Patient denied any lower extremity numbness or weakness.  MRI of the L-spine done 10/12/2016 with acute or subacute burst fracture of L1 causing mild spinal stenosis due to retropulsion of bone into the canal.  Chronic compression fractures T11 and L3, mild spinal stenosis L4-5.  MRI of the lumbar spine to rule out any new compression fractures.  Patient was placed on a Lidoderm patch as well as oxycodone 5 mg every  6 hours as needed for pain control.  Patient's pain was better controlled on this regimen.  Patient also placed on scheduled Tylenol.  MRI of patient's lumbar spine was obtained which was negative for any acute or unhealed fractures.  Patient will be discharged home with close follow-up with PCP.    Procedures:  CT head 08/18/2018  Chest x-ray 08/18/2018  MRI L-spine  08/22/2018  Consultations:  None  Discharge Exam: Vitals:   08/22/18 1443 08/22/18 1448  BP: (!) 160/81 124/77  Pulse: 91 76  Resp: 16 14  Temp: 98.7 F (37.1 C) 98.1 F (36.7 C)  SpO2: 100% 96%    General: NAD Cardiovascular: RRR Respiratory: CTAB  Discharge Instructions   Discharge Instructions    Diet general   Complete by:  As directed    Increase activity slowly   Complete by:  As directed      Allergies as of 08/22/2018      Reactions   Ambien [zolpidem] Other (See Comments)   Hallucinations and paranoid      Medication List  STOP taking these medications   cefpodoxime 200 MG tablet Commonly known as:  VANTIN   lisinopril-hydrochlorothiazide 10-12.5 MG tablet Commonly known as:  PRINZIDE,ZESTORETIC   methocarbamol 500 MG tablet Commonly known as:  ROBAXIN   oxyCODONE-acetaminophen 5-325 MG tablet Commonly known as:  PERCOCET/ROXICET     TAKE these medications   acetaminophen 500 MG tablet Commonly known as:  TYLENOL Take 1 tablet (500 mg total) by mouth 3 (three) times daily.   albuterol 108 (90 Base) MCG/ACT inhaler Commonly known as:  PROVENTIL HFA;VENTOLIN HFA Inhale 1-2 puffs into the lungs every 4 (four) hours as needed for wheezing or shortness of breath.   amLODipine 5 MG tablet Commonly known as:  NORVASC Take 1 tablet (5 mg total) by mouth daily. What changed:    medication strength  how much to take   cyanocobalamin 1000 MCG/ML injection Commonly known as:  (VITAMIN B-12) Inject 1 mL (1,000 mcg total) into the muscle daily. Take 1055mcg IM daily x 4 days,  then weekly x 1 month, then monthly Start taking on:  08/23/2018   docusate sodium 250 MG capsule Commonly known as:  COLACE Take 1 capsule (250 mg total) by mouth daily.   DULoxetine 30 MG capsule Commonly known as:  CYMBALTA Take 1 capsule (30 mg total) by mouth daily for 14 days.   DULoxetine 30 MG capsule Commonly known as:  CYMBALTA Take 1 capsule by mouth as directed. 1 capsule daily for 1 week, increase to 2 capsules therafter   gabapentin 100 MG capsule Commonly known as:  NEURONTIN Take 100-300 mg by mouth at bedtime as needed for pain.   lidocaine 5 % Commonly known as:  LIDODERM Place 1 patch onto the skin daily. Remove & Discard patch within 12 hours or as directed by MD Start taking on:  08/23/2018   oxyCODONE 5 MG immediate release tablet Commonly known as:  Oxy IR/ROXICODONE Take 1 tablet (5 mg total) by mouth every 6 (six) hours as needed for breakthrough pain.   polyethylene glycol packet Commonly known as:  MIRALAX / GLYCOLAX Take 17 g by mouth daily as needed for mild constipation.   senna 8.6 MG Tabs tablet Commonly known as:  SENOKOT Take 1 tablet (8.6 mg total) by mouth 2 (two) times daily.   SYRINGE 3CC/22GX3/4" 22G X 3/4" 3 ML Misc 1,000 mcg by Does not apply route daily.            Durable Medical Equipment  (From admission, onward)         Start     Ordered   08/22/18 0833  For home use only DME 3 n 1  Once     08/22/18 0832         Allergies  Allergen Reactions  . Ambien [Zolpidem] Other (See Comments)    Hallucinations and paranoid   Follow-up Information    Adriana Downing, MD. Schedule an appointment as soon as possible for a visit in 2 week(s).   Specialty:  Family Medicine Contact information: [place] [address] [phone]            The results of significant diagnostics from this hospitalization (including imaging, microbiology, ancillary and laboratory) are listed below for  reference.    Significant Diagnostic Studies: Dg Chest 2 View  Result Date: 08/18/2018 CLINICAL DATA:  Altered mental status.  Sepsis. EXAM: CHEST - 2 VIEW COMPARISON:  None. FINDINGS: Lungs are hypoinflated without focal airspace consolidation or effusion. Cardiomediastinal  silhouette and remainder of the exam is unchanged. IMPRESSION: Hypoinflation without acute cardiopulmonary disease. Electronically Signed   By: Marin Olp M.D.   On: 08/18/2018 18:46   Ct Head Wo Contrast  Result Date: 08/18/2018 CLINICAL DATA:  Altered mental status. EXAM: CT HEAD WITHOUT CONTRAST TECHNIQUE: Contiguous axial images were obtained from the base of the skull through the vertex without intravenous contrast. COMPARISON:  06/09/2018 FINDINGS: Brain: Ventricles, cisterns and other CSF spaces are normal. Small old lacune infarct over the left lentiform nucleus. No mass, mass effect, shift of midline structures or acute hemorrhage. No evidence of acute infarction. Vascular: No hyperdense vessel or unexpected calcification. Skull: Normal. Negative for fracture or focal lesion. Sinuses/Orbits: Orbits are within normal. There is opacification/mucosal membrane thickening involving the right maxillary sinus unchanged. Other: None. IMPRESSION: No acute findings. Old small left central lacunar infarct. Chronic inflammatory change of the right maxillary sinus. Electronically Signed   By: Marin Olp M.D.   On: 08/18/2018 21:37   Mr Lumbar Spine W Wo Contrast  Result Date: 08/22/2018 CLINICAL DATA:  Worsening of back pain. History compression fractures. EXAM: MRI LUMBAR SPINE WITHOUT AND WITH CONTRAST TECHNIQUE: Multiplanar and multiecho pulse sequences of the lumbar spine were obtained without and with intravenous contrast. CONTRAST:  78mL MULTIHANCE GADOBENATE DIMEGLUMINE 529 MG/ML IV SOLN COMPARISON:  MRI 10/12/2016 FINDINGS: Segmentation:  5 lumbar type vertebral bodies. Alignment: Curvature convex the left with the apex at  L1-2. Straightening and kyphotic curvature of the thoracolumbar junction region. Vertebrae: No evidence of recent fracture or unhealed fracture. Previous augmentation of T11 with loss of height 10-20%. Previous augmentation of L1 with loss of height 70%. Posterior bowing of the posterior vertebral body by distance of 9 mm, encroaching upon the spinal canal and crowding the nerve roots. No evidence of edema or enhancement. Old superior endplate fracture at L2 with loss of height of 10%. This was not present previously, but is healed. Old augmented fracture at L3 with loss of height of 40%. Mild posterior bowing but no compressive stenosis. No fracture at L4 or L5. Previous bilateral sacroplasty appears completely healed. Conus medullaris and cauda equina: Conus extends to the L1 level. Crowding of the nerve roots due to retropulsion at L1 as noted above. Paraspinal and other soft tissues: Distended bladder suggesting the possibility of neurogenic bladder. Disc levels: T10-11 and T11-12: Mild disc bulges.  No compressive stenosis. T12-L1: Mild bulging of the disc. As noted above, there is chronic retropulsion of posterior aspect of L1, 9-10 mm. This encroaches upon the spinal canal and crowds the nerve roots and the conus tip. The subarachnoid space does appear to remain present, this possibly could be the cause the presenting symptoms as well as bladder distension. L1-2: Mild bulging of the disc. Retropulsion at L1 as described in the level above. No compressive stenosis at the L1-2 disc level. L2-3: Bulging of the disc. Mild posterior bowing of the L3 vertebral body but without significant encroachment upon the spinal canal. L3-4: Mild bulging of the disc.  No compressive stenosis. L4-5: Desiccation of the disc but without loss of height the bulging. Facet degeneration and mild hypertrophy. Mild narrowing of the lateral recesses without likely neural compression. L5-S1: Bulging of the disc.  Facet degeneration.  No  stenosis. IMPRESSION: No new or unhealed fracture. Old previously augmented fractures at T11, L1, L3 and the sacrum as outlined above. Some interval loss of height at the superior endplate of L2, not augmented, but healed. Healed 9  mm retropulsion of the posterior aspect of the L1 vertebral body, encroaching upon the spinal canal, crowding nerve roots of the cauda equina in the conus tip. Distended bladder raising the question of neurogenic bladder. Otherwise, there are changes of ordinary degenerative disc disease and degenerative facet disease in the lumbar region, without evidence of advanced disease. Possibly, the facet arthropathy in the lower lumbar spine could be symptomatic. Electronically Signed   By: Nelson Chimes M.D.   On: 08/22/2018 07:11   Dg Abd 2 Views  Result Date: 08/19/2018 CLINICAL DATA:  Generalized abdominal pain. EXAM: ABDOMEN - 2 VIEW COMPARISON:  Lumbar spine radiographs from 10/10/2016. FINDINGS: Numbering system of the thoracolumbar spine as per prior. Six non ribbed lumbar vertebrae were noted. Cement augmentation performed at T12, L2, L4 and both sacral ala. Bowel gas pattern is unremarkable. There is no free air. No obstruction. Calcifications project over the left renal shadow compatible with left-sided nephrolithiasis. Pelvic phleboliths are noted bilaterally. IMPRESSION: 1. Nonobstructed, nondistended bowel gas pattern. 2. Left-sided nephrolithiasis. 3. Six non rib-bearing lumbar vertebrae with numbering system as per prior. Vertebral augmentation has been performed and based on this numbering system, cement noted at T12, L2, L4 and both sacral ala. Electronically Signed   By: Ashley Royalty M.D.   On: 08/19/2018 21:18    Microbiology: Recent Results (from the past 240 hour(s))  Blood Culture (routine x 2)     Status: None (Preliminary result)   Collection Time: 08/18/18  5:47 PM  Result Value Ref Range Status   Specimen Description   Final    BLOOD RIGHT  ANTECUBITAL Performed at [facility] [address], [place], [place] [address]    Special Requests   Final    BOTTLES DRAWN AEROBIC AND ANAEROBIC Blood Culture adequate volume Performed at [facility] [address] [place], [place] [address]    Culture   Final    NO GROWTH 4 DAYS Performed at [facility], [place] [address] [place], [place] [address]    Report Status PENDING  Incomplete  Urine culture     Status: None   Collection Time: 08/18/18  6:09 PM  Result Value Ref Range Status   Specimen Description   Final    URINE, RANDOM Performed at [facility] [address], [place], [place] [address]    Special Requests   Final    NONE Performed at [facility], [place] [address], [place], [place] [address]    Culture   Final    NO GROWTH Performed at [facility], [place] [address], [place], [place] [address]    Report Status 08/20/2018 FINAL  Final  Blood Culture (routine x 2)     Status: None (Preliminary result)   Collection Time: 08/18/18  6:14 PM  Result Value Ref Range Status   Specimen Description   Final    BLOOD BLOOD LEFT FOREARM Performed at [facility] [address], [place], [place] [address]    Special Requests   Final    BOTTLES DRAWN AEROBIC AND ANAEROBIC Blood Culture results may not be optimal due to an excessive volume of blood received in culture bottles Performed at [facility] [address] [place], [place] [address]    Culture   Final    NO GROWTH 4 DAYS Performed at [facility], [place] [address], [place],  [address]    Report Status PENDING  Incomplete     Labs: Basic Metabolic Panel: Recent Labs  Lab 08/18/18 1747 08/19/18 0544 08/20/18 0548 08/21/18 0818 08/22/18 0900  NA 140 139 140 140 142  K 4.4 3.9 3.5 3.6 3.5  CL 97* 106 106 106 106  CO2 27 24 20* 24 28  GLUCOSE 151* 112* 91 107* 107*   BUN 36* 28* 14 10 10   CREATININE 1.31* 0.75 0.61 0.56 0.61  CALCIUM 9.7 8.2* 8.2* 8.5* 8.8*   Liver Function Tests: Recent Labs  Lab 08/18/18 1747 08/19/18 0544  AST 24 16  ALT 11 11  ALKPHOS 76 55  BILITOT 1.0 0.8  PROT 8.9* 6.6  ALBUMIN 4.6 3.4*   No results for input(s): LIPASE, AMYLASE in the last 168 hours. Recent Labs  Lab 08/18/18 1935 08/19/18 0836  AMMONIA 40* 18   CBC: Recent Labs  Lab 08/18/18 1747 08/19/18 0544 08/20/18 0548 08/21/18 0818  WBC 14.9* 8.7 8.5 7.4  NEUTROABS  --   --  6.4  --   HGB 13.3 10.5* 10.6* 11.2*  HCT 42.2 33.7* 33.2* 34.9*  MCV 88.3 87.1 87.1 86.0  PLT 478* 378 345 345   Cardiac Enzymes: No results for input(s): CKTOTAL, CKMB, CKMBINDEX, TROPONINI in the last 168 hours. BNP: BNP (last 3 results) Recent Labs    06/09/18 0809  BNP 33.4    ProBNP (last 3 results) No results for input(s): PROBNP in the last 8760 hours.  CBG: Recent Labs  Lab 08/18/18 1807  GLUCAP 144*       Signed:  Irine Seal MD.  [facility] 08/22/2018, 4:26 PM

## 2018-08-22 NOTE — Care Management Important Message (Signed)
Important Message  Patient Details  Name: [name] MRN: [id number] Date of Birth: 11-20-1936   Medicare Important Message Given:  Yes    Kerin Salen 08/22/2018, 12:34 [place] Message  Patient Details  Name: [name] MRN: [id number] Date of Birth: 12/01/1936   Medicare Important Message Given:  Yes    Kerin Salen 08/22/2018, 12:34 PM

## 2018-08-22 NOTE — Progress Notes (Signed)
Physical Therapy Treatment Patient Details Name: [name] MRN: [id number] DOB: 16-May-1936 Today's Date: 08/22/2018    History of Present Illness Pt was admitted for AMS. 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    PT Comments    Assisted pt to EOB required MAX encouragement and positive reinforcement.  Pt sat EOB x 5 min but when attempted to stand pivot to [facility] [place] [facility] became increasingly anxious/agittated and fearful of falling.  Pt declined any OOB activity.  Assisted back to bed and positioned to comfort.   Follow Up Recommendations  SNF     Equipment Recommendations  None recommended by PT    Recommendations for Other Services       Precautions / Restrictions Precautions Precautions: Fall Precaution Comments: anxiety Restrictions Weight Bearing Restrictions: No    Mobility  Bed Mobility Overal bed mobility: Needs Assistance Bed Mobility: Supine to Sit;Sit to Supine     Supine to sit: Min assist Sit to supine: Mod assist   General bed mobility comments: assist for trunk; use of pad to scoot to EOB with increased time then required increased assist back to bed to support B LE's and scoot to [place] transfer comment: attempted however partial stance when pt's anxiety increased, thus fear and pt declined to attempt further activity.    Ambulation/Gait                 Stairs             Wheelchair Mobility    Modified Rankin (Stroke Patients Only)       Balance                                            Cognition Arousal/Alertness: Awake/alert Behavior During Therapy: Anxious                                   General Comments: Hx dementia      Exercises      General Comments        Pertinent Vitals/Pain Pain Assessment: Faces Faces Pain Scale: Hurts little more Pain Location: chronic back Pain Descriptors / Indicators: Discomfort;Aching;Grimacing Pain  Intervention(s): Monitored during session;Repositioned    Home Living                      Prior Function            PT Goals (current goals can now be found in the care plan section) Progress towards PT goals: Progressing toward goals    Frequency    Min 3X/week      PT Plan Current plan remains appropriate    Co-evaluation              AM-PAC PT "6 Clicks" Daily Activity  Outcome Measure  Difficulty turning over in bed (including adjusting bedclothes, sheets and blankets)?: Unable Difficulty moving from lying on back to sitting on the side of the bed? : Unable Difficulty sitting down on and standing up from a chair with arms (e.g., wheelchair, bedside commode, etc,.)?: Unable Help needed moving to and from a bed to chair (including a wheelchair)?: A Lot Help needed walking in hospital room?: A Lot  Help needed climbing 3-5 steps with a railing? : Total 6 Click Score: 8    End of Session Equipment Utilized During Treatment: Gait belt Activity Tolerance: Other (comment)(pt self limiting) Patient left: in bed;with bed alarm set;with call bell/phone within reach Nurse Communication: Mobility status PT Visit Diagnosis: Unsteadiness on feet (R26.81);Muscle weakness (generalized) (M62.81)     Time: 5638-7564 PT Time Calculation (min) (ACUTE ONLY): 19 min  Charges:  $Therapeutic Activity: 8-22 mins                       Rica Koyanagi  PTA [facility]  Acute  Rehab Pager      [phone]

## 2018-08-22 NOTE — Progress Notes (Addendum)
CSW following for discharge needs to SNF. CSW called and left voicemail for the patient son to discuss rehab and SNF options.   Patient declines SNF. Patient son is agreeable for the patient to return home w/ home health services. Case Manager Suanne Marker following for d/c needs.   Kathrin Greathouse, Marlinda Mike, MSW Clinical Social Worker  [phone] 08/22/2018  10:54 AM

## 2018-08-22 NOTE — Progress Notes (Signed)
Inpatient Rehabilitation Admissions Coordinator  Asked to evaluate patient for an inpt rehab admission per Dr. Grandville Silos. I reviewed chart and concur with PT and OT that most appropriate rehab venue is SNF for her diagnosis of encephalopathy and with her continued needs for 24/7 assist for her chronic issues. I have notified RN CM, Rhonda, and we will sign off at this time.Please call me with any questions.  Danne Baxter, RN, MSN Rehab Admissions Coordinator [phone] 08/22/2018 10:24 AM

## 2018-08-22 NOTE — Care Management Note (Signed)
Case Management Note  Patient Details  Name: [name] MRN: [id number] Date of Birth: 02/06/1936  Subjective/Objective:                  Discharge to home with hhc  Action/Plan: [facility] home health care for dme and home care.  Expected Discharge Date:                  Expected Discharge Plan:  [place]  In-House Referral:     Discharge planning Services  CM Consult  Post Acute Care Choice:  Durable Medical Equipment, Home Health Choice offered to:  Patient  DME Arranged:  3-N-1 DME Agency:  [place]:  RN, PT, OT, Nurse's Aide, Social Work [employer] Agency:  [place]  Status of Service:  Completed, signed off  If discussed at [employer], dates discussed:    Additional Comments:  Leeroy Cha, RN 08/22/2018, 11:03 AM

## 2018-08-23 LAB — CULTURE, BLOOD (ROUTINE X 2)
CULTURE: NO GROWTH
Culture: NO GROWTH
Special Requests: ADEQUATE

## 2018-08-29 ENCOUNTER — Inpatient Hospital Stay (HOSPITAL_COMMUNITY)
Admission: EM | Admit: 2018-08-29 | Discharge: 2018-09-03 | DRG: 190 | Disposition: A | Discharge status: Home Health | Payer: Medicare Other | Attending: Internal Medicine | Admitting: Internal Medicine

## 2018-08-29 ENCOUNTER — Inpatient Hospital Stay (HOSPITAL_COMMUNITY): Payer: Medicare Other

## 2018-08-29 ENCOUNTER — Emergency Department (HOSPITAL_COMMUNITY): Payer: Medicare Other

## 2018-08-29 ENCOUNTER — Encounter (HOSPITAL_COMMUNITY): Payer: Self-pay

## 2018-08-29 DIAGNOSIS — G9340 Encephalopathy, unspecified: Secondary | ICD-10-CM | POA: Diagnosis present

## 2018-08-29 DIAGNOSIS — E876 Hypokalemia: Secondary | ICD-10-CM | POA: Diagnosis present

## 2018-08-29 DIAGNOSIS — G8929 Other chronic pain: Secondary | ICD-10-CM | POA: Diagnosis present

## 2018-08-29 DIAGNOSIS — F919 Conduct disorder, unspecified: Secondary | ICD-10-CM | POA: Diagnosis not present

## 2018-08-29 DIAGNOSIS — R627 Adult failure to thrive: Secondary | ICD-10-CM | POA: Diagnosis present

## 2018-08-29 DIAGNOSIS — I10 Essential (primary) hypertension: Secondary | ICD-10-CM | POA: Diagnosis present

## 2018-08-29 DIAGNOSIS — E538 Deficiency of other specified B group vitamins: Secondary | ICD-10-CM | POA: Diagnosis present

## 2018-08-29 DIAGNOSIS — F32A Depression, unspecified: Secondary | ICD-10-CM | POA: Diagnosis present

## 2018-08-29 DIAGNOSIS — F329 Major depressive disorder, single episode, unspecified: Secondary | ICD-10-CM | POA: Diagnosis present

## 2018-08-29 DIAGNOSIS — F0390 Unspecified dementia without behavioral disturbance: Secondary | ICD-10-CM | POA: Diagnosis present

## 2018-08-29 DIAGNOSIS — M6282 Rhabdomyolysis: Secondary | ICD-10-CM | POA: Diagnosis present

## 2018-08-29 DIAGNOSIS — M810 Age-related osteoporosis without current pathological fracture: Secondary | ICD-10-CM | POA: Diagnosis present

## 2018-08-29 DIAGNOSIS — Z90710 Acquired absence of both cervix and uterus: Secondary | ICD-10-CM

## 2018-08-29 DIAGNOSIS — Z888 Allergy status to other drugs, medicaments and biological substances status: Secondary | ICD-10-CM | POA: Diagnosis not present

## 2018-08-29 DIAGNOSIS — G92 Toxic encephalopathy: Secondary | ICD-10-CM | POA: Diagnosis present

## 2018-08-29 DIAGNOSIS — N179 Acute kidney failure, unspecified: Secondary | ICD-10-CM

## 2018-08-29 DIAGNOSIS — J9610 Chronic respiratory failure, unspecified whether with hypoxia or hypercapnia: Secondary | ICD-10-CM | POA: Diagnosis present

## 2018-08-29 DIAGNOSIS — E860 Dehydration: Secondary | ICD-10-CM | POA: Diagnosis present

## 2018-08-29 DIAGNOSIS — T40605A Adverse effect of unspecified narcotics, initial encounter: Secondary | ICD-10-CM | POA: Diagnosis present

## 2018-08-29 DIAGNOSIS — N132 Hydronephrosis with renal and ureteral calculous obstruction: Secondary | ICD-10-CM | POA: Diagnosis present

## 2018-08-29 DIAGNOSIS — Z79899 Other long term (current) drug therapy: Secondary | ICD-10-CM | POA: Diagnosis not present

## 2018-08-29 DIAGNOSIS — R4182 Altered mental status, unspecified: Secondary | ICD-10-CM

## 2018-08-29 DIAGNOSIS — J431 Panlobular emphysema: Secondary | ICD-10-CM | POA: Diagnosis not present

## 2018-08-29 DIAGNOSIS — M545 Low back pain: Secondary | ICD-10-CM | POA: Diagnosis not present

## 2018-08-29 DIAGNOSIS — Z9981 Dependence on supplemental oxygen: Secondary | ICD-10-CM

## 2018-08-29 DIAGNOSIS — J9611 Chronic respiratory failure with hypoxia: Secondary | ICD-10-CM | POA: Diagnosis not present

## 2018-08-29 DIAGNOSIS — R6251 Failure to thrive (child): Secondary | ICD-10-CM

## 2018-08-29 DIAGNOSIS — J449 Chronic obstructive pulmonary disease, unspecified: Secondary | ICD-10-CM | POA: Diagnosis present

## 2018-08-29 DIAGNOSIS — Z87891 Personal history of nicotine dependence: Secondary | ICD-10-CM

## 2018-08-29 DIAGNOSIS — M549 Dorsalgia, unspecified: Secondary | ICD-10-CM

## 2018-08-29 LAB — COMPREHENSIVE METABOLIC PANEL
ALT: 22 U/L (ref 0–44)
AST: 23 U/L (ref 15–41)
Albumin: 3.4 g/dL — ABNORMAL LOW (ref 3.5–5.0)
Alkaline Phosphatase: 57 U/L (ref 38–126)
Anion gap: 17 — ABNORMAL HIGH (ref 5–15)
BUN: 45 mg/dL — AB (ref 8–23)
CO2: 29 mmol/L (ref 22–32)
Calcium: 8.6 mg/dL — ABNORMAL LOW (ref 8.9–10.3)
Chloride: 94 mmol/L — ABNORMAL LOW (ref 98–111)
Creatinine, Ser: 2.02 mg/dL — ABNORMAL HIGH (ref 0.44–1.00)
GFR calc non Af Amer: 22 mL/min — ABNORMAL LOW (ref >60)
GFR, EST AFRICAN AMERICAN: 26 mL/min — AB (ref >60)
Glucose, Bld: 87 mg/dL (ref 70–99)
POTASSIUM: 3.1 mmol/L — AB (ref 3.5–5.1)
SODIUM: 140 mmol/L (ref 135–145)
Total Bilirubin: 0.6 mg/dL (ref 0.3–1.2)
Total Protein: 6.5 g/dL (ref 6.5–8.1)

## 2018-08-29 LAB — BLOOD GAS, ARTERIAL
Acid-Base Excess: 2.1 mmol/L — ABNORMAL HIGH (ref 0.0–2.0)
BICARBONATE: 27.5 mmol/L (ref 20.0–28.0)
DRAWN BY: 422461
FIO2: 21
O2 Saturation: 90.5 %
Patient temperature: 98.6
pCO2 arterial: 49.7 mmHg — ABNORMAL HIGH (ref 32.0–48.0)
pH, Arterial: 7.361 (ref 7.350–7.450)
pO2, Arterial: 64.6 mmHg — ABNORMAL LOW (ref 83.0–108.0)

## 2018-08-29 LAB — I-STAT CG4 LACTIC ACID, ED
LACTIC ACID, VENOUS: 1.12 mmol/L (ref 0.5–1.9)
LACTIC ACID, VENOUS: 0.75 mmol/L (ref 0.5–1.9)

## 2018-08-29 LAB — CBC WITH DIFFERENTIAL/PLATELET
Basophils Absolute: 0 10*3/uL (ref 0.0–0.1)
Basophils Relative: 0 %
Eosinophils Absolute: 0.2 10*3/uL (ref 0.0–0.7)
Eosinophils Relative: 2 %
HEMATOCRIT: 34.8 % — AB (ref 36.0–46.0)
HEMOGLOBIN: 11.1 g/dL — AB (ref 12.0–15.0)
LYMPHS ABS: 1.8 10*3/uL (ref 0.7–4.0)
LYMPHS PCT: 18 %
MCH: 28 pg (ref 26.0–34.0)
MCHC: 31.9 g/dL (ref 30.0–36.0)
MCV: 87.7 fL (ref 78.0–100.0)
Monocytes Absolute: 0.9 10*3/uL (ref 0.1–1.0)
Monocytes Relative: 9 %
NEUTROS PCT: 71 %
Neutro Abs: 7.1 10*3/uL (ref 1.7–7.7)
PLATELETS: 478 10*3/uL — AB (ref 150–400)
RBC: 3.97 MIL/uL (ref 3.87–5.11)
RDW: 14.5 % (ref 11.5–15.5)
WBC: 10 10*3/uL (ref 4.0–10.5)

## 2018-08-29 LAB — I-STAT BETA HCG BLOOD, ED (MC, WL, AP ONLY): I-stat hCG, quantitative: <5 m[IU]/mL (ref <5)

## 2018-08-29 LAB — CK: CK TOTAL: 570 U/L — AB (ref 38–234)

## 2018-08-29 MED ORDER — ONDANSETRON HCL 4 MG PO TABS: 4.0000 mg | ORAL_TABLET | Freq: Four times a day (QID) | ORAL | Status: DC | PRN

## 2018-08-29 MED ORDER — OXYCODONE HCL 5 MG PO TABS
5.0000 mg | ORAL_TABLET | ORAL | Status: DC | PRN
  Administered 2018-08-30 – 2018-09-03 (×16): 5 mg via ORAL
  Filled 2018-08-29 (×16): qty 1

## 2018-08-29 MED ORDER — SODIUM CHLORIDE 0.9 % IV SOLN
Freq: Once | INTRAVENOUS | Status: AC
  Administered 2018-08-29: 20:00:00 via INTRAVENOUS

## 2018-08-29 MED ORDER — HYDROCODONE-ACETAMINOPHEN 5-325 MG PO TABS
1.0000 | ORAL_TABLET | Freq: Once | ORAL | Status: AC
  Administered 2018-08-29: 1 via ORAL
  Filled 2018-08-29: qty 1

## 2018-08-29 MED ORDER — ACETAMINOPHEN 325 MG PO TABS
650.0000 mg | ORAL_TABLET | Freq: Four times a day (QID) | ORAL | Status: DC | PRN
  Administered 2018-08-30 – 2018-09-03 (×5): 650 mg via ORAL
  Filled 2018-08-29 (×5): qty 2

## 2018-08-29 MED ORDER — ENOXAPARIN SODIUM 30 MG/0.3ML ~~LOC~~ SOLN
30.0000 mg | Freq: Every day | SUBCUTANEOUS | Status: DC
  Administered 2018-08-30: 30 mg via SUBCUTANEOUS
  Filled 2018-08-29: qty 0.3

## 2018-08-29 MED ORDER — ONDANSETRON HCL 4 MG/2ML IJ SOLN
4.0000 mg | Freq: Four times a day (QID) | INTRAMUSCULAR | Status: DC | PRN
  Filled 2018-08-29: qty 2

## 2018-08-29 MED ORDER — SENNOSIDES-DOCUSATE SODIUM 8.6-50 MG PO TABS
1.0000 | ORAL_TABLET | Freq: Every evening | ORAL | Status: DC | PRN
  Filled 2018-08-29: qty 1

## 2018-08-29 MED ORDER — SODIUM CHLORIDE 0.9 % IV SOLN
INTRAVENOUS | Status: AC
  Administered 2018-08-29: via INTRAVENOUS

## 2018-08-29 MED ORDER — POTASSIUM CHLORIDE CRYS ER 20 MEQ PO TBCR
40.0000 meq | EXTENDED_RELEASE_TABLET | Freq: Once | ORAL | Status: AC
  Administered 2018-08-29: 40 meq via ORAL
  Filled 2018-08-29: qty 2

## 2018-08-29 MED ORDER — SODIUM CHLORIDE 0.9 % IV BOLUS
1000.0000 mL | Freq: Once | INTRAVENOUS | Status: AC
  Administered 2018-08-29: 1000 mL via INTRAVENOUS

## 2018-08-29 MED ORDER — ACETAMINOPHEN 650 MG RE SUPP: 650.0000 mg | Freq: Four times a day (QID) | RECTAL | Status: DC | PRN
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
## 2018-08-29 NOTE — Care Management (Addendum)
[facility] ED CM noted patient has medical and prescription coverage [employer] , unfortunately we are unable to assist with medication assistance. Patient was discharged from [facility] 8/26 home with son and is active with [facility] services. CM will notify them of this ED visit to follow up with patient, if patient is evaluated and discharged back home.

## 2018-08-29 NOTE — ED Notes (Signed)
ED TO INPATIENT HANDOFF REPORT  Name/Age/Gender [name] 82 y.o. female  Code Status Code Status History    Date Active Date Inactive Code Status Order ID Comments User Context   08/18/2018 2308 08/22/2018 2028 Full Code [id number]  Elwyn Reach, MD Inpatient   10/10/2016 1653 10/17/2016 1853 Full Code [id number]  Albertine Patricia, MD Inpatient   12/19/2015 1143 12/26/2015 1946 Full Code [id number]  Velvet Bathe, MD Inpatient   03/19/2014 1922 03/21/2014 1817 Full Code [id number]  Jonetta Osgood, MD Inpatient   03/10/2014 2319 03/13/2014 1657 Full Code [id number]  Cristal Ford, DO Inpatient      Home/SNF/Other Home  Chief Complaint Chronic Back Pain/Out of Meds.   Level of Care/Admitting Diagnosis ED Disposition    ED Disposition Condition Comment   Admit  Hospital Area: [facility] [[id number]]  Level of Care: Med-Surg [16]  Diagnosis: AKI (acute kidney injury) [facility]) [[id number]]  Admitting Physician: Shelbie Proctor [[id number]]  Attending Physician: Shelbie Proctor [[id number]]  Estimated length of stay: 3 - 4 days  Certification:: I certify this patient will need inpatient services for at least 2 midnights  PT Class (Do Not Modify): Inpatient [101]  PT Acc Code (Do Not Modify): Private [1]       Medical History Past Medical History:  Diagnosis Date  . Chronic back pain   . Chronic back pain 08/22/2018  . Colitis 03/19/2014  . COPD (chronic obstructive pulmonary disease) ([place])   . Hypertension   . On home oxygen therapy 06-14-13   continuos 2.5l/m nasally-24/7  . Osteoporosis   . Oxygen dependent     Allergies Allergies  Allergen Reactions  . Ambien [Zolpidem] Other (See Comments)    Hallucinations and paranoid    IV Location/Drains/Wounds Patient Lines/Drains/Airways Status   Active Line/Drains/Airways    Name:   Placement date:   Placement time:   Site:   Days:   Peripheral IV 08/29/18 Left Antecubital   08/29/18    1716     Antecubital   less than 1          Labs/Imaging Results for orders placed or performed during the hospital encounter of 08/29/18 (from the past 48 hour(s))  Comprehensive metabolic panel     Status: Abnormal   Collection Time: 08/29/18  5:28 PM  Result Value Ref Range   Sodium 140 135 - 145 mmol/L   Potassium 3.1 (L) 3.5 - 5.1 mmol/L   Chloride 94 (L) 98 - 111 mmol/L   CO2 29 22 - 32 mmol/L   Glucose, Bld 87 70 - 99 mg/dL   BUN 45 (H) 8 - 23 mg/dL   Creatinine, Ser 2.02 (H) 0.44 - 1.00 mg/dL   Calcium 8.6 (L) 8.9 - 10.3 mg/dL   Total Protein 6.5 6.5 - 8.1 g/dL   Albumin 3.4 (L) 3.5 - 5.0 g/dL   AST 23 15 - 41 U/L   ALT 22 0 - 44 U/L   Alkaline Phosphatase 57 38 - 126 U/L   Total Bilirubin 0.6 0.3 - 1.2 mg/dL   GFR calc non Af Amer 22 (L) >60 mL/min   GFR calc Af Amer 26 (L) >60 mL/min    Comment: (NOTE) The eGFR has been calculated using the CKD EPI equation. This calculation has not been validated in all clinical situations. eGFR's persistently <60 mL/min signify possible Chronic Kidney Disease.    Anion gap 17 (H) 5 - 15    Comment: Performed at [facility], [place] [address] [place], [place] [address]  CBC with Differential     Status: Abnormal   Collection Time: 08/29/18  5:28 PM  Result Value Ref Range   WBC 10.0 4.0 - 10.5 K/uL   RBC 3.97 3.87 - 5.11 MIL/uL   Hemoglobin 11.1 (L) 12.0 - 15.0 g/dL   HCT 34.8 (L) 36.0 - 46.0 %   MCV 87.7 78.0 - 100.0 fL   MCH 28.0 26.0 - 34.0 pg   MCHC 31.9 30.0 - 36.0 g/dL   RDW 14.5 11.5 - 15.5 %   Platelets 478 (H) 150 - 400 K/uL   Neutrophils Relative % 71 %   Neutro Abs 7.1 1.7 - 7.7 K/uL   Lymphocytes Relative 18 %   Lymphs Abs 1.8 0.7 - 4.0 K/uL   Monocytes Relative 9 %   Monocytes Absolute 0.9 0.1 - 1.0 K/uL   Eosinophils Relative 2 %   Eosinophils Absolute 0.2 0.0 - 0.7 K/uL   Basophils Relative 0 %   Basophils Absolute 0.0 0.0 - 0.1 K/uL    Comment: Performed at [facility],  [place] [address], [place], [place] [address]  I-Stat CG4 Lactic Acid, ED     Status: None   Collection Time: 08/29/18  5:28 PM  Result Value Ref Range   Lactic Acid, Venous 1.12 0.5 - 1.9 mmol/L  CK     Status: Abnormal   Collection Time: 08/29/18  5:28 PM  Result Value Ref Range   Total CK 570 (H) 38 - 234 U/L    Comment: Performed at [facility], [place] [address], [place], [place] [address]  I-Stat beta hCG blood, ED ([facility], [facility], [facility] only)     Status: None   Collection Time: 08/29/18  7:32 PM  Result Value Ref Range   I-stat hCG, quantitative <5.0 <5 mIU/mL   Comment 3            Comment:   GEST. AGE      CONC.  (mIU/mL)   <=1 WEEK        5 - 50     2 WEEKS       50 - 500     3 WEEKS       100 - 10,000     4 WEEKS     1,000 - 30,000        FEMALE AND NON-PREGNANT FEMALE:     LESS THAN 5 mIU/mL   I-Stat CG4 Lactic Acid, ED     Status: None   Collection Time: 08/29/18  8:01 PM  Result Value Ref Range   Lactic Acid, Venous 0.75 0.5 - 1.9 mmol/L  Blood gas, arterial     Status: Abnormal   Collection Time: 08/29/18  8:30 PM  Result Value Ref Range   FIO2 21.00    pH, Arterial 7.361 7.350 - 7.450   pCO2 arterial 49.7 (H) 32.0 - 48.0 mmHg   pO2, Arterial 64.6 (L) 83.0 - 108.0 mmHg   Bicarbonate 27.5 20.0 - 28.0 mmol/L   Acid-Base Excess 2.1 (H) 0.0 - 2.0 mmol/L   O2 Saturation 90.5 %   Patient temperature 98.6    Collection site RIGHT RADIAL    Drawn by [id number]    Sample type ARTERIAL DRAW    Allens test (pass/fail) PASS PASS    Comment: Performed at [facility], [place] [address] [place], [place] [address]   Dg Chest 2 View  Result Date: 08/29/2018  CLINICAL DATA:  82 year old female with history of altered mental status. EXAM: CHEST - 2 VIEW COMPARISON:  Chest CT 08/18/2018. FINDINGS: Mild elevation of the left hemidiaphragm, unchanged. Lung volumes are low. No consolidative airspace disease. No pleural effusions. No pneumothorax. No pulmonary  nodule or mass noted. Pulmonary vasculature and the cardiomediastinal silhouette are within normal limits. Atherosclerosis in the thoracic aorta. IMPRESSION: 1. Low lung volumes without radiographic evidence of acute cardiopulmonary disease. 2. Aortic atherosclerosis. Electronically Signed   By: Vinnie Langton M.D.   On: 08/29/2018 19:19   Ct Head Wo Contrast  Result Date: 08/29/2018 CLINICAL DATA:  Headache, altered mental status EXAM: CT HEAD WITHOUT CONTRAST TECHNIQUE: Contiguous axial images were obtained from the base of the skull through the vertex without intravenous contrast. COMPARISON:  CT 08/18/2018, 06/09/2018 FINDINGS: Brain: No acute territorial infarction, hemorrhage or intracranial mass. Old lacunar infarct in the left basal ganglia and white matter. Mild small vessel ischemic changes of the white matter. Mild atrophy. Stable ventricle size Vascular: No hyperdense vessels.  Carotid vascular calcification Skull: Normal. Negative for fracture or focal lesion. Sinuses/Orbits: Mucosal thickening in the ethmoid and right maxillary sinus. No acute orbital abnormality Other: None. IMPRESSION: 1. No CT evidence for acute intracranial abnormality. 2. Atrophy and small vessel ischemic changes of the white matter Electronically Signed   By: Donavan Foil M.D.   On: 08/29/2018 17:09    Pending Labs Unresulted Labs (From admission, onward)    Start     Ordered   08/29/18 2013  Urine rapid drug screen (hosp performed)  STAT,   R     08/29/18 2012   08/29/18 2013  Magnesium  Once,   R     08/29/18 2012   08/29/18 2013  Phosphorus  Once,   R     08/29/18 2012   08/29/18 1718  Culture, blood (routine x 2)  BLOOD CULTURE X 2,   STAT     08/29/18 1718   08/29/18 1644  Urinalysis, Routine w reflex microscopic  STAT,   STAT     08/29/18 1645   08/29/18 1644  Urine culture  STAT,   STAT     08/29/18 1645          Vitals/Pain Today's Vitals   08/29/18 1922 08/29/18 1930 08/29/18 2100 08/29/18  2130  BP:  (!) 125/48 (!) 116/47 (!) 102/44  Pulse:  82 87 78  Resp:  (!) '21 20 16  '$ Temp:      TempSrc:      SpO2:  93% 97% 100%  PainSc: 6        Isolation Precautions No active isolations  Medications Medications  sodium chloride 0.9 % bolus 1,000 mL (0 mLs Intravenous Stopped 08/29/18 1950)  HYDROcodone-acetaminophen (NORCO/VICODIN) 5-325 MG per tablet 1 tablet (1 tablet Oral Given 08/29/18 1732)  potassium chloride SA (K-DUR,KLOR-CON) CR tablet 40 mEq (40 mEq Oral Given 08/29/18 1918)  0.9 %  sodium chloride infusion ( Intravenous New Bag/Given 08/29/18 1952)    Mobility walks

## 2018-08-29 NOTE — ED Notes (Signed)
Bed: [facility] Expected date:  Expected time:  Means of arrival:  Comments: EMS chronic back pain
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
## 2018-08-29 NOTE — ED Notes (Signed)
Patient transported to X-ray 
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
## 2018-08-30 ENCOUNTER — Other Ambulatory Visit: Payer: Self-pay

## 2018-08-30 LAB — BASIC METABOLIC PANEL
Anion gap: 12 (ref 5–15)
BUN: 32 mg/dL — ABNORMAL HIGH (ref 8–23)
CALCIUM: 8.1 mg/dL — AB (ref 8.9–10.3)
CO2: 25 mmol/L (ref 22–32)
CREATININE: 1.27 mg/dL — AB (ref 0.44–1.00)
Chloride: 101 mmol/L (ref 98–111)
GFR calc Af Amer: 45 mL/min — ABNORMAL LOW (ref >60)
GFR calc non Af Amer: 39 mL/min — ABNORMAL LOW (ref >60)
Glucose, Bld: 100 mg/dL — ABNORMAL HIGH (ref 70–99)
Potassium: 4 mmol/L (ref 3.5–5.1)
SODIUM: 138 mmol/L (ref 135–145)

## 2018-08-30 LAB — MAGNESIUM: MAGNESIUM: 1.7 mg/dL (ref 1.7–2.4)

## 2018-08-30 LAB — RAPID URINE DRUG SCREEN, HOSP PERFORMED
Amphetamines: NOT DETECTED
BARBITURATES: NOT DETECTED
BENZODIAZEPINES: NOT DETECTED
COCAINE: NOT DETECTED
OPIATES: POSITIVE — AB
TETRAHYDROCANNABINOL: NOT DETECTED

## 2018-08-30 LAB — PHOSPHORUS: Phosphorus: 2.6 mg/dL (ref 2.5–4.6)

## 2018-08-30 LAB — CK: CK TOTAL: 332 U/L — AB (ref 38–234)

## 2018-08-30 MED ORDER — SODIUM CHLORIDE 0.9 % IV BOLUS
500.0000 mL | Freq: Once | INTRAVENOUS | Status: AC
  Administered 2018-08-30: 500 mL via INTRAVENOUS

## 2018-08-30 MED ORDER — CYANOCOBALAMIN 1000 MCG/ML IJ SOLN: 1000.0000 ug | Freq: Once | INTRAMUSCULAR | Status: DC

## 2018-08-30 MED ORDER — HALOPERIDOL LACTATE 5 MG/ML IJ SOLN: 2.0000 mg | Freq: Four times a day (QID) | INTRAMUSCULAR | Status: DC | PRN

## 2018-08-30 MED ORDER — LIP MEDEX EX OINT
TOPICAL_OINTMENT | CUTANEOUS | Status: AC
  Administered 2018-08-30: 10:00:00
  Filled 2018-08-30: qty 7

## 2018-08-30 MED ORDER — HALOPERIDOL LACTATE 5 MG/ML IJ SOLN
1.0000 mg | Freq: Once | INTRAMUSCULAR | Status: DC
  Filled 2018-08-30: qty 1

## 2018-08-30 MED ORDER — CYANOCOBALAMIN 1000 MCG/ML IJ SOLN
1000.0000 ug | Freq: Every day | INTRAMUSCULAR | Status: DC
  Administered 2018-08-30 – 2018-08-31 (×2): 1000 ug via INTRAMUSCULAR
  Filled 2018-08-30 (×2): qty 1

## 2018-08-30 MED ORDER — POTASSIUM CHLORIDE CRYS ER 20 MEQ PO TBCR
40.0000 meq | EXTENDED_RELEASE_TABLET | Freq: Two times a day (BID) | ORAL | Status: AC
  Administered 2018-08-30 (×2): 40 meq via ORAL
  Filled 2018-08-30 (×2): qty 2

## 2018-08-30 MED ORDER — MORPHINE SULFATE (PF) 4 MG/ML IV SOLN
4.0000 mg | INTRAVENOUS | Status: DC | PRN
  Administered 2018-08-31 – 2018-09-03 (×9): 4 mg via INTRAVENOUS
  Filled 2018-08-30 (×10): qty 1
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
## 2018-08-30 NOTE — Progress Notes (Signed)
Text paged to inform floor coverage that pt did not void and refused all PO.
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
## 2018-08-30 NOTE — Progress Notes (Signed)
Informed on call floor coverage provider about pt refusing blood draws, medications all assessments, and her behavior via text page.

## 2018-08-31 DIAGNOSIS — J9611 Chronic respiratory failure with hypoxia: Secondary | ICD-10-CM

## 2018-08-31 DIAGNOSIS — E876 Hypokalemia: Secondary | ICD-10-CM

## 2018-08-31 DIAGNOSIS — G8929 Other chronic pain: Secondary | ICD-10-CM

## 2018-08-31 DIAGNOSIS — E860 Dehydration: Secondary | ICD-10-CM

## 2018-08-31 DIAGNOSIS — N179 Acute kidney failure, unspecified: Secondary | ICD-10-CM

## 2018-08-31 DIAGNOSIS — G9340 Encephalopathy, unspecified: Secondary | ICD-10-CM

## 2018-08-31 DIAGNOSIS — M545 Low back pain: Secondary | ICD-10-CM

## 2018-08-31 LAB — BASIC METABOLIC PANEL
ANION GAP: 9 (ref 5–15)
BUN: 27 mg/dL — ABNORMAL HIGH (ref 8–23)
CO2: 31 mmol/L (ref 22–32)
Calcium: 9 mg/dL (ref 8.9–10.3)
Chloride: 103 mmol/L (ref 98–111)
Creatinine, Ser: 1 mg/dL (ref 0.44–1.00)
GFR, EST NON AFRICAN AMERICAN: 52 mL/min — AB (ref >60)
GLUCOSE: 112 mg/dL — AB (ref 70–99)
POTASSIUM: 3.9 mmol/L (ref 3.5–5.1)
Sodium: 143 mmol/L (ref 135–145)

## 2018-08-31 MED ORDER — ENOXAPARIN SODIUM 40 MG/0.4ML ~~LOC~~ SOLN
40.0000 mg | Freq: Every day | SUBCUTANEOUS | Status: DC
  Administered 2018-08-31 – 2018-09-02 (×3): 40 mg via SUBCUTANEOUS
  Filled 2018-08-31 (×3): qty 0.4

## 2018-08-31 MED ORDER — CYANOCOBALAMIN 1000 MCG/ML IJ SOLN
1000.0000 ug | Freq: Once | INTRAMUSCULAR | Status: AC
  Administered 2018-09-01: 1000 ug via SUBCUTANEOUS
  Filled 2018-08-31: qty 1

## 2018-08-31 MED ORDER — ENSURE ENLIVE PO LIQD
237.0000 mL | Freq: Two times a day (BID) | ORAL | Status: DC
  Administered 2018-08-31 – 2018-09-01 (×2): 237 mL via ORAL

## 2018-08-31 MED ORDER — AMLODIPINE BESYLATE 5 MG PO TABS
5.0000 mg | ORAL_TABLET | Freq: Every day | ORAL | Status: DC
  Administered 2018-08-31 – 2018-09-03 (×4): 5 mg via ORAL
  Filled 2018-08-31 (×4): qty 1
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
## 2018-08-31 NOTE — Progress Notes (Signed)
Physical Therapy Treatment Patient Details Name: [name] MRN: [id number] DOB: [birth year] Today's Date: 08/31/2018    History of Present Illness Pt was admitted for AMS/AKI. 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    PT Comments    Pt continues reluctantly cooperative but only to EOB sitting and refusing to attempt standing or ambulation 2* fear of elevating pain level.  Increased time and ++ encouragement required for limited mobility achieved.   Follow Up Recommendations  SNF     Equipment Recommendations  None recommended by PT    Recommendations for Other Services       Precautions / Restrictions Precautions Precautions: Fall Precaution Comments: anxiety;  Restrictions Weight Bearing Restrictions: No    Mobility  Bed Mobility Overal bed mobility: Needs Assistance Bed Mobility: Supine to Sit;Rolling Rolling: Supervision   Supine to sit: Supervision;Min guard Sit to supine: Min assist   General bed mobility comments: Increased time; tactile cues to facilitate motion--LEs of bed; pt is reluctant but cooperative  Transfers                 General transfer comment: Pt refusing to attempt standing - I dont want it to hurt more  Ambulation/Gait                 Stairs             Wheelchair Mobility    Modified Rankin (Stroke Patients Only)       Balance Overall balance assessment: Needs assistance Sitting-balance support: Feet supported;Bilateral upper extremity supported Sitting balance-Leahy Scale: Fair                                      Cognition Arousal/Alertness: Awake/alert Behavior During Therapy: Anxious Overall Cognitive Status: No family/caregiver present to determine baseline cognitive functioning Area of Impairment: Following commands;Attention;Orientation;Problem solving                       Following Commands: Follows one step commands with increased time     Problem Solving:  Decreased initiation;Requires verbal cues        Exercises      General Comments        Pertinent Vitals/Pain Pain Assessment: 0-10 Pain Score: 4  Pain Location: back pain Pain Descriptors / Indicators: Discomfort;Aching;Grimacing Pain Intervention(s): Limited activity within patient's tolerance;Monitored during session;Premedicated before session    Home Living                      Prior Function            PT Goals (current goals can now be found in the care plan section) Acute Rehab PT Goals Patient Stated Goal: none stated; agreeable to working with therapy PT Goal Formulation: With patient Time For Goal Achievement: 09/06/18 Potential to Achieve Goals: Fair Progress towards PT goals: Not progressing toward goals - comment(Pt fatigued and reluctant to move 2* fear of pain)    Frequency    Min 3X/week      PT Plan Current plan remains appropriate    Co-evaluation              AM-PAC PT "6 Clicks" Daily Activity  Outcome Measure  Difficulty turning over in bed (including adjusting bedclothes, sheets and blankets)?: A Lot Difficulty moving from lying on back to sitting on the side of the bed? :  Unable Difficulty sitting down on and standing up from a chair with arms (e.g., wheelchair, bedside commode, etc,.)?: Unable Help needed moving to and from a bed to chair (including a wheelchair)?: A Lot Help needed walking in hospital room?: A Lot Help needed climbing 3-5 steps with a railing? : Total 6 Click Score: 9    End of Session   Activity Tolerance: Patient tolerated treatment well Patient left: in bed;with bed alarm set;with call bell/phone within reach Nurse Communication: Mobility status PT Visit Diagnosis: Unsteadiness on feet (R26.81);Muscle weakness (generalized) (M62.81)     Time: 1000-1028 PT Time Calculation (min) (ACUTE ONLY): 28 min  Charges:  $Therapeutic Activity: 23-37 mins                     Pg [phone]  [id number]    Isley Weisheit 08/31/2018, 1:07 PM
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
## 2018-09-01 DIAGNOSIS — R6251 Failure to thrive (child): Secondary | ICD-10-CM

## 2018-09-01 DIAGNOSIS — Z87891 Personal history of nicotine dependence: Secondary | ICD-10-CM

## 2018-09-01 DIAGNOSIS — F919 Conduct disorder, unspecified: Secondary | ICD-10-CM

## 2018-09-01 DIAGNOSIS — R627 Adult failure to thrive: Secondary | ICD-10-CM

## 2018-09-01 DIAGNOSIS — R4182 Altered mental status, unspecified: Secondary | ICD-10-CM

## 2018-09-01 DIAGNOSIS — J431 Panlobular emphysema: Secondary | ICD-10-CM

## 2018-09-01 DIAGNOSIS — Z79899 Other long term (current) drug therapy: Secondary | ICD-10-CM

## 2018-09-01 LAB — BASIC METABOLIC PANEL
Anion gap: 13 (ref 5–15)
BUN: 15 mg/dL (ref 8–23)
CALCIUM: 9.3 mg/dL (ref 8.9–10.3)
CHLORIDE: 96 mmol/L — AB (ref 98–111)
CO2: 33 mmol/L — ABNORMAL HIGH (ref 22–32)
Creatinine, Ser: 0.8 mg/dL (ref 0.44–1.00)
Glucose, Bld: 121 mg/dL — ABNORMAL HIGH (ref 70–99)
Potassium: 4.3 mmol/L (ref 3.5–5.1)
SODIUM: 142 mmol/L (ref 135–145)

## 2018-09-01 MED ORDER — QUETIAPINE FUMARATE 25 MG PO TABS
12.5000 mg | ORAL_TABLET | Freq: Two times a day (BID) | ORAL | Status: DC | PRN
  Administered 2018-09-03: 12.5 mg via ORAL
  Filled 2018-09-01 (×2): qty 1

## 2018-09-01 MED ORDER — QUETIAPINE FUMARATE 25 MG PO TABS
25.0000 mg | ORAL_TABLET | Freq: Every day | ORAL | Status: DC
  Administered 2018-09-01: 25 mg via ORAL
  Filled 2018-09-01: qty 1

## 2018-09-01 MED ORDER — VITAMIN B-12 1000 MCG PO TABS
1000.0000 ug | ORAL_TABLET | Freq: Every day | ORAL | Status: DC
  Administered 2018-09-02 – 2018-09-03 (×2): 1000 ug via ORAL
  Filled 2018-09-01 (×2): qty 1

## 2018-09-01 NOTE — Progress Notes (Signed)
Physical Therapy Treatment Patient Details Name: [name] MRN: [id number] DOB: 1936-05-12 Today's Date: 09/01/2018    History of Present Illness Pt was admitted for AMS/AKI. 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    PT Comments    Patient progressing some this session with attempts at standing, but unable to safely take steps to pivot to chair or even to back up closer to bed.  Initially in sitting required mod A for balance and was hyperfocused on the issues of last evening and needed mod cues to focus on mobility tasks.  Feel she will need SNF level rehab upon d/c.  PT to follow.    Follow Up Recommendations  SNF;Supervision/Assistance - 24 hour     Equipment Recommendations  Wheelchair (measurements PT);Wheelchair cushion (measurements PT)    Recommendations for Other Services       Precautions / Restrictions Precautions Precautions: Fall Precaution Comments: anxiety;     Mobility  Bed Mobility Overal bed mobility: Needs Assistance Bed Mobility: Rolling;Sidelying to Sit;Sit to Supine Rolling: Min assist Sidelying to sit: Max assist   Sit to supine: Mod assist   General bed mobility comments: assist to reach rail to roll, assist for legs off bed and trunk elevation to sit, increased time to get sitting balance, then to supine assist for legs; +2 A to scoot to [facility]  Transfers Overall transfer level: Needs assistance Equipment used: None Transfers: Sit to/from Stand Sit to Stand: Max assist         General transfer comment: lifting and lowering assist x 2 trials from EOB, attempted stepping back to bed, but unable  Ambulation/Gait                 Stairs             Wheelchair Mobility    Modified Rankin (Stroke Patients Only)       Balance Overall balance assessment: Needs assistance   Sitting balance-Leahy Scale: Poor Sitting balance - Comments: initially with legs out in front and not supporting wtih UE's needing mod A due to  leaning posteriorly, then placed feet on floor and hands beside her and pt able to balance with close S, but high risk for falls Postural control: Posterior lean Standing balance support: Bilateral upper extremity supported Standing balance-Leahy Scale: Poor Standing balance comment: held her up in standing she held my arms and stood about 10 seconds with bilateral LE tremor, then second attempt about 15 seconds and unable to shift weight for stepping                            Cognition Arousal/Alertness: Awake/alert;Lethargic Behavior During Therapy: Anxious Overall Cognitive Status: No family/caregiver present to determine baseline cognitive functioning Area of Impairment: Attention;Following commands;Orientation                 Orientation Level: Disoriented to;Place;Time;Situation Current Attention Level: Sustained   Following Commands: Follows one step commands with increased time       General Comments: awoke from sleep and was disoriented, then focused on events of last night and hyperverbal about family issues; cues to calm and focus on mobility      Exercises      General Comments        Pertinent Vitals/Pain Pain Assessment: Faces Faces Pain Scale: Hurts even more Pain Location: back pain Pain Descriptors / Indicators: Discomfort;Aching;Grimacing Pain Intervention(s): Monitored during session;Repositioned;Patient requesting pain meds-RN notified  Home Living                      Prior Function            PT Goals (current goals can now be found in the care plan section) Progress towards PT goals: Progressing toward goals    Frequency    Min 3X/week      PT Plan Current plan remains appropriate    Co-evaluation              AM-PAC PT "6 Clicks" Daily Activity  Outcome Measure  Difficulty turning over in bed (including adjusting bedclothes, sheets and blankets)?: Unable Difficulty moving from lying on back to  sitting on the side of the bed? : Unable Difficulty sitting down on and standing up from a chair with arms (e.g., wheelchair, bedside commode, etc,.)?: Unable Help needed moving to and from a bed to chair (including a wheelchair)?: A Lot Help needed walking in hospital room?: Total Help needed climbing 3-5 steps with a railing? : Total 6 Click Score: 7    End of Session Equipment Utilized During Treatment: Gait belt Activity Tolerance: Patient limited by pain Patient left: in bed;with call bell/phone within reach;with bed alarm set Nurse Communication: Mobility status PT Visit Diagnosis: Unsteadiness on feet (R26.81);Muscle weakness (generalized) (M62.81)     Time: 3888-7579 PT Time Calculation (min) (ACUTE ONLY): 24 min  Charges:  $Therapeutic Activity: 23-37 mins                     [place], [place] [phone] 09/01/2018    Reginia Naas 09/01/2018, 4:31 PM

## 2018-09-01 NOTE — Consult Note (Signed)
[facility] Consult   Reason for Consult:  BEHAVIORAL DISTURBANCES Referring Physician:  Hospitalist Patient Identification: [name] MRN:  [id number] Principal Diagnosis: AKI (acute kidney injury) [facility]) Diagnosis:   Patient Active Problem List   Diagnosis Date Noted  . Failure to thrive (0-17) [R62.51]   . Behavior disturbance [F91.9]   . AKI (acute kidney injury) ([place]) [N17.9] 08/29/2018  . Encephalopathy acute [G93.40] 08/22/2018  . Chronic back pain [M54.9, G89.29] 08/22/2018  . Vitamin B12 deficiency [E53.8] 08/19/2018  . Dehydration [E86.0]   . Tachycardia [R00.0]   . Altered mental state [R41.82] 08/18/2018  . Closed compression fracture of L1 vertebra ([place]) [S32.010A]   . Acute cystitis without hematuria [N30.00]   . Pain management [R52]   . Intractable pain [R52]   . Back pain [M54.9] 10/10/2016  . L2 vertebral fracture ([place]) [S32.029A] 10/10/2016  . Acute encephalopathy [G93.40] 12/25/2015  . Drug-induced delirium ([place]) [H63.149] 12/25/2015  . Acute on chronic respiratory failure ([place]) [J96.20] 12/25/2015  . CAP (community acquired pneumonia) [J18.9] 12/19/2015  . Chronic respiratory failure ([place]) [J96.10] 03/19/2014  . Depression [F32.9] 03/19/2014  . Aspiration pneumonia ([place]) [J69.0] 03/10/2014  . Acute respiratory failure with hypoxia ([place]) [J96.01] 03/10/2014  . Sepsis ([place]) [A41.9] 03/10/2014  . Hypokalemia [E87.6] 03/10/2014  . Hypertension [I10] 03/10/2014  . COPD (chronic obstructive pulmonary disease) ([place]) [J44.9] 03/10/2014  . Tobacco abuse [Z72.0] 03/10/2014  . Anemia [D64.9] 06/15/2013  . Generalized abdominal pain [R10.84] 06/15/2013  . Blood in the stool [K92.1] 06/15/2013    Total Time spent with patient: 15 minutes  Subjective:   [name] is a 82 y.o. female patient admitted with altered mental status.  HPI:  Patient is a 82 year old female admitted for altered mental status,this is patient's second inpatient  hospitalization for altered mental status in the past month.  Patient seen today, was unable to respond to questions, received Seroquel this morning and is extremely sedated. Based on nursing reports, patient waxes and wanes in her consciousness, reports she wants to go home, states today that she's not doing well. Patient is oriented to self but does not know where she is, unclear if this is because of the Seroquel she received this morning.  Patient has no previous psychiatric history, has never had any psychiatric hospitalization.  Past Psychiatric History: none per chart review  Risk to Self:  None Risk to Others:None   Prior Inpatient Therapy:   Prior Outpatient Therapy:    Past Medical History:  Past Medical History:  Diagnosis Date  . Chronic back pain   . Chronic back pain 08/22/2018  . Colitis 03/19/2014  . COPD (chronic obstructive pulmonary disease) ([place])   . Hypertension   . On home oxygen therapy 06-14-13   continuos 2.5l/m nasally-24/7  . Osteoporosis   . Oxygen dependent     Past Surgical History:  Procedure Laterality Date  . ABDOMINAL HYSTERECTOMY    . ESOPHAGOGASTRODUODENOSCOPY N/A 06/15/2013   Procedure: ESOPHAGOGASTRODUODENOSCOPY (EGD);  Surgeon: [name], MD;  Location: [facility];  Service: Endoscopy;  Laterality: N/A;  . IR GENERIC HISTORICAL  10/15/2016   IR KYPHO LUMBAR INC FX REDUCE BONE BX UNI/BIL CANNULATION INC/IMAGING 10/15/2016 Luanne Bras, MD [facility]  . sacroplasty  06-14-13   05-10-13 IVR CONE for fracture stabilization   Family History: History reviewed. No pertinent family history. Family Psychiatric  History: unable to be obtained Social History:  Social History   Substance and Sexual Activity  Alcohol Use No  Social History   Substance and Sexual Activity  Drug Use No    Social History   Socioeconomic History  . Marital status: Divorced    Spouse name: Not on file  . Number of children: Not on file  .  Years of education: Not on file  . Highest education level: Not on file  Occupational History  . Not on file  Social Needs  . Financial resource strain: Not on file  . Food insecurity:    Worry: Not on file    Inability: Not on file  . Transportation needs:    Medical: Not on file    Non-medical: Not on file  Tobacco Use  . Smoking status: Former Smoker    Types: Cigarettes    Last attempt to quit: 06/15/2011    Years since quitting: 7.2  . Smokeless tobacco: Never Used  Substance and Sexual Activity  . Alcohol use: No  . Drug use: No  . Sexual activity: Never  Lifestyle  . Physical activity:    Days per week: Not on file    Minutes per session: Not on file  . Stress: Not on file  Relationships  . Social connections:    Talks on phone: Not on file    Gets together: Not on file    Attends religious service: Not on file    Active member of club or organization: Not on file    Attends meetings of clubs or organizations: Not on file    Relationship status: Not on file  Other Topics Concern  . Not on file  Social History Narrative  . Not on file   Additional Social History:    Allergies:   Allergies  Allergen Reactions  . Ambien [Zolpidem] Other (See Comments)    Hallucinations and paranoid    Labs:  Results for orders placed or performed during the hospital encounter of 08/29/18 (from the past 48 hour(s))  Basic metabolic panel     Status: Abnormal   Collection Time: 08/31/18  5:14 AM  Result Value Ref Range   Sodium 143 135 - 145 mmol/L   Potassium 3.9 3.5 - 5.1 mmol/L   Chloride 103 98 - 111 mmol/L   CO2 31 22 - 32 mmol/L   Glucose, Bld 112 (H) 70 - 99 mg/dL   BUN 27 (H) 8 - 23 mg/dL   Creatinine, Ser 1.00 0.44 - 1.00 mg/dL   Calcium 9.0 8.9 - 10.3 mg/dL   GFR calc non Af Amer 52 (L) >60 mL/min   GFR calc Af Amer >60 >60 mL/min    Comment: (NOTE) The eGFR has been calculated using the CKD EPI equation. This calculation has not been validated in all  clinical situations. eGFR's persistently <60 mL/min signify possible Chronic Kidney Disease.    Anion gap 9 5 - 15    Comment: Performed at [facility], [place] [address], [place], [place] [address]  Basic metabolic panel     Status: Abnormal   Collection Time: 09/01/18  5:34 AM  Result Value Ref Range   Sodium 142 135 - 145 mmol/L   Potassium 4.3 3.5 - 5.1 mmol/L   Chloride 96 (L) 98 - 111 mmol/L   CO2 33 (H) 22 - 32 mmol/L   Glucose, Bld 121 (H) 70 - 99 mg/dL   BUN 15 8 - 23 mg/dL   Creatinine, Ser 0.80 0.44 - 1.00 mg/dL   Calcium 9.3 8.9 - 10.3 mg/dL   GFR calc non  Af Amer >60 >60 mL/min   GFR calc Af Amer >60 >60 mL/min    Comment: (NOTE) The eGFR has been calculated using the CKD EPI equation. This calculation has not been validated in all clinical situations. eGFR's persistently <60 mL/min signify possible Chronic Kidney Disease.    Anion gap 13 5 - 15    Comment: Performed at [facility], [place] [address] [place], [place] [address]    Current Facility-Administered Medications  Medication Dose Route Frequency Provider Last Rate Last Dose  . acetaminophen (TYLENOL) tablet 650 mg  650 mg Oral Q6H PRN Johnson-Pitts, Endia, MD   650 mg at 09/01/18 0553   Or  . acetaminophen (TYLENOL) suppository 650 mg  650 mg Rectal Q6H PRN Johnson-Pitts, Endia, MD      . amLODipine (NORVASC) tablet 5 mg  5 mg Oral Daily Debbe Odea, MD   5 mg at 09/01/18 0748  . enoxaparin (LOVENOX) injection 40 mg  40 mg Subcutaneous QHS Minda Ditto, RPH   40 mg at 08/31/18 2201  . feeding supplement (ENSURE ENLIVE) (ENSURE ENLIVE) liquid 237 mL  237 mL Oral BID BM Rizwan, Saima, MD   237 mL at 09/01/18 1353  . haloperidol lactate (HALDOL) injection 1 mg  1 mg Intravenous Once Kirby-Graham, Karsten Fells, NP      . morphine 4 MG/ML injection 4 mg  4 mg Intravenous Q4H PRN Charlynne Cousins, MD   4 mg at 08/31/18 1732  . ondansetron (ZOFRAN) tablet 4 mg  4 mg Oral Q6H  PRN Johnson-Pitts, Endia, MD       Or  . ondansetron (ZOFRAN) injection 4 mg  4 mg Intravenous Q6H PRN Johnson-Pitts, Endia, MD      . oxyCODONE (Oxy IR/ROXICODONE) immediate release tablet 5 mg  5 mg Oral Q4H PRN Johnson-Pitts, Endia, MD   5 mg at 09/01/18 1605  . QUEtiapine (SEROQUEL) tablet 25 mg  25 mg Oral Daily Debbe Odea, MD   25 mg at 09/01/18 0748  . senna-docusate (Senokot-S) tablet 1 tablet  1 tablet Oral QHS PRN Johnson-Pitts, Endia, MD      . vitamin B-12 (CYANOCOBALAMIN) tablet 1,000 mcg  1,000 mcg Oral Daily Debbe Odea, MD        Musculoskeletal: Strength & Muscle Tone: unable to assess Gait & Station: unable to assess Patient leans: N/A  Psychiatric Specialty Exam: Physical Exam  ROS  Blood pressure (!) 154/63, pulse 69, temperature 97.8 F (36.6 C), temperature source Oral, resp. rate 16, height _0  (1.473 m), weight 57.9 kg, SpO2 96 %.Body mass index is 26.68 kg/m.  General Appearance: sedated, eyes closed  Eye Contact:  Minimal  Speech:  Slow and Slurred  Volume:  Decreased  Mood:  appears sedated  Affect:  Blunt  Thought Process:  NA and Descriptions of Associations: Loose  Orientation:  Other:  only self, able to answer her name  Thought Content:  NA  Suicidal Thoughts:  did not answer  Homicidal Thoughts:  did not answer  Memory:  Negative  Judgement:  Poor  Insight:  patient is delirious, not answering questions  Psychomotor Activity:  Restlessness  Concentration:  Concentration: unable to assess and Attention Span: unable to assess  Recall:  unable to assess  Fund of Knowledge:  unable to assess  Language:  Poor  Akathisia:  No    AIMS (if indicated):     Assets:  Financial Resources/Insurance Housing Social Support  ADL's:  Impaired  Cognition:  Impaired,  Severe  Sleep:        Treatment Plan Summary: Plan patient could not be assessed, is delirious at this time. Would benefit from Seroquel being changed to 12.5 mg twice a day and  the Haldol being discontinued  Disposition: will have consult service follow the patient for reassessment tomorrow  Hampton Abbot, MD 09/01/2018 4:41 PM
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
## 2018-09-01 NOTE — Care Management Important Message (Signed)
Important Message  Patient Details  Name: SHARONNE RICKETTS MRN: [id number] Date of Birth: 28-Mar-1936   Medicare Important Message Given:  Yes    Kerin Salen 09/01/2018, 12:28 [place] Message  Patient Details  Name: DATRA CLARY MRN: [id number] Date of Birth: [birth year]   Medicare Important Message Given:  Yes    Kerin Salen 09/01/2018, 12:28 PM
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
## 2018-09-03 DIAGNOSIS — E538 Deficiency of other specified B group vitamins: Secondary | ICD-10-CM

## 2018-09-03 DIAGNOSIS — F329 Major depressive disorder, single episode, unspecified: Secondary | ICD-10-CM

## 2018-09-03 LAB — BASIC METABOLIC PANEL
ANION GAP: 13 (ref 5–15)
BUN: 18 mg/dL (ref 8–23)
CHLORIDE: 97 mmol/L — AB (ref 98–111)
CO2: 31 mmol/L (ref 22–32)
CREATININE: 0.88 mg/dL (ref 0.44–1.00)
Calcium: 9.6 mg/dL (ref 8.9–10.3)
GFR calc non Af Amer: >60 mL/min (ref >60)
Glucose, Bld: 182 mg/dL — ABNORMAL HIGH (ref 70–99)
POTASSIUM: 4.4 mmol/L (ref 3.5–5.1)
Sodium: 141 mmol/L (ref 135–145)

## 2018-09-03 LAB — CULTURE, BLOOD (ROUTINE X 2)
Culture: NO GROWTH
Culture: NO GROWTH
SPECIAL REQUESTS: ADEQUATE

## 2018-09-03 LAB — CBC
HEMATOCRIT: 38.4 % (ref 36.0–46.0)
HEMOGLOBIN: 11.7 g/dL — AB (ref 12.0–15.0)
MCH: 27.2 pg (ref 26.0–34.0)
MCHC: 30.5 g/dL (ref 30.0–36.0)
MCV: 89.3 fL (ref 78.0–100.0)
Platelets: 555 10*3/uL — ABNORMAL HIGH (ref 150–400)
RBC: 4.3 MIL/uL (ref 3.87–5.11)
RDW: 14.5 % (ref 11.5–15.5)
WBC: 10.1 10*3/uL (ref 4.0–10.5)

## 2018-09-03 MED ORDER — OXYCODONE HCL 5 MG PO TABS: 5.0000 mg | ORAL_TABLET | Freq: Four times a day (QID) | ORAL | 0 refills | Status: DC | PRN

## 2018-09-03 MED ORDER — ENSURE ENLIVE PO LIQD: 237.0000 mL | Freq: Two times a day (BID) | ORAL | 12 refills | Status: DC

## 2018-09-03 MED ORDER — CYANOCOBALAMIN 1000 MCG PO TABS: 1000.0000 ug | ORAL_TABLET | Freq: Every day | ORAL | 0 refills | Status: DC

## 2018-09-03 NOTE — Discharge Instructions (Signed)
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
## 2018-09-03 NOTE — Care Management Note (Addendum)
Case Management Note  Patient Details  Name: [name] MRN: [id number] Date of Birth: August 15, 1936  Subjective/Objective:    AKI, Acute Encephalopathy                Action/Plan: NCM attempted to speak to pt and best to call son, [name]. Attempted call to son, [name] and left HIPAA compliant message to return call. Pt active with [facility] for HH. Contacted [facility] to make aware of dc home today. Has 3n1 and RW at home.   4:31 pm NCM spoke to son. PTAR arranged.   Expected Discharge Date:  09/03/18               Expected Discharge Plan:  [place]  In-House Referral:  Clinical Social Work  Discharge planning Services  CM Consult  Post Acute Care Choice:  Home Health, Resumption of Svcs/PTA Provider Choice offered to:  Patient, Adult Children  DME Arranged:  N/A DME Agency:  NA  HH Arranged:  RN, PT, OT, Nurse's Aide [place] Agency:  [facility]  Status of Service:  Completed, signed off  If discussed at [place] of Stay Meetings, dates discussed:    Additional Comments:  Erenest Rasher, RN 09/03/2018, 1:39 PM

## 2019-05-01 ENCOUNTER — Encounter (HOSPITAL_COMMUNITY): Payer: Self-pay | Admitting: Internal Medicine

## 2019-05-01 ENCOUNTER — Other Ambulatory Visit: Payer: Self-pay

## 2019-05-01 ENCOUNTER — Emergency Department (HOSPITAL_COMMUNITY): Payer: Medicare Other

## 2019-05-01 ENCOUNTER — Observation Stay (HOSPITAL_COMMUNITY): Payer: Medicare Other

## 2019-05-01 ENCOUNTER — Inpatient Hospital Stay (HOSPITAL_COMMUNITY)
Admission: EM | Admit: 2019-05-01 | Discharge: 2019-05-02 | DRG: 065 | Disposition: A | Discharge status: Rehabilitation Facility | Payer: Medicare Other | Attending: Internal Medicine | Admitting: Internal Medicine

## 2019-05-01 DIAGNOSIS — J9611 Chronic respiratory failure with hypoxia: Secondary | ICD-10-CM | POA: Diagnosis present

## 2019-05-01 DIAGNOSIS — R29713 NIHSS score 13: Secondary | ICD-10-CM | POA: Diagnosis present

## 2019-05-01 DIAGNOSIS — J439 Emphysema, unspecified: Secondary | ICD-10-CM | POA: Diagnosis present

## 2019-05-01 DIAGNOSIS — J431 Panlobular emphysema: Secondary | ICD-10-CM

## 2019-05-01 DIAGNOSIS — R609 Edema, unspecified: Secondary | ICD-10-CM | POA: Diagnosis not present

## 2019-05-01 DIAGNOSIS — I639 Cerebral infarction, unspecified: Secondary | ICD-10-CM | POA: Diagnosis present

## 2019-05-01 DIAGNOSIS — Z79899 Other long term (current) drug therapy: Secondary | ICD-10-CM

## 2019-05-01 DIAGNOSIS — J9610 Chronic respiratory failure, unspecified whether with hypoxia or hypercapnia: Secondary | ICD-10-CM | POA: Diagnosis present

## 2019-05-01 DIAGNOSIS — G8929 Other chronic pain: Secondary | ICD-10-CM | POA: Diagnosis present

## 2019-05-01 DIAGNOSIS — D75839 Thrombocytosis, unspecified: Secondary | ICD-10-CM

## 2019-05-01 DIAGNOSIS — J449 Chronic obstructive pulmonary disease, unspecified: Secondary | ICD-10-CM | POA: Diagnosis not present

## 2019-05-01 DIAGNOSIS — E876 Hypokalemia: Secondary | ICD-10-CM | POA: Diagnosis present

## 2019-05-01 DIAGNOSIS — I10 Essential (primary) hypertension: Secondary | ICD-10-CM

## 2019-05-01 DIAGNOSIS — M25562 Pain in left knee: Secondary | ICD-10-CM | POA: Diagnosis present

## 2019-05-01 DIAGNOSIS — R29810 Facial weakness: Secondary | ICD-10-CM | POA: Diagnosis present

## 2019-05-01 DIAGNOSIS — F329 Major depressive disorder, single episode, unspecified: Secondary | ICD-10-CM | POA: Diagnosis present

## 2019-05-01 DIAGNOSIS — I6389 Other cerebral infarction: Secondary | ICD-10-CM | POA: Diagnosis present

## 2019-05-01 DIAGNOSIS — E785 Hyperlipidemia, unspecified: Secondary | ICD-10-CM | POA: Diagnosis present

## 2019-05-01 DIAGNOSIS — F321 Major depressive disorder, single episode, moderate: Secondary | ICD-10-CM | POA: Diagnosis not present

## 2019-05-01 DIAGNOSIS — D509 Iron deficiency anemia, unspecified: Secondary | ICD-10-CM | POA: Diagnosis present

## 2019-05-01 DIAGNOSIS — M810 Age-related osteoporosis without current pathological fracture: Secondary | ICD-10-CM | POA: Diagnosis present

## 2019-05-01 DIAGNOSIS — N289 Disorder of kidney and ureter, unspecified: Secondary | ICD-10-CM

## 2019-05-01 DIAGNOSIS — I69398 Other sequelae of cerebral infarction: Secondary | ICD-10-CM | POA: Diagnosis not present

## 2019-05-01 DIAGNOSIS — I129 Hypertensive chronic kidney disease with stage 1 through stage 4 chronic kidney disease, or unspecified chronic kidney disease: Secondary | ICD-10-CM | POA: Diagnosis present

## 2019-05-01 DIAGNOSIS — D519 Vitamin B12 deficiency anemia, unspecified: Secondary | ICD-10-CM | POA: Diagnosis present

## 2019-05-01 DIAGNOSIS — Z9981 Dependence on supplemental oxygen: Secondary | ICD-10-CM

## 2019-05-01 DIAGNOSIS — D473 Essential (hemorrhagic) thrombocythemia: Secondary | ICD-10-CM

## 2019-05-01 DIAGNOSIS — I69322 Dysarthria following cerebral infarction: Secondary | ICD-10-CM | POA: Diagnosis not present

## 2019-05-01 DIAGNOSIS — N183 Chronic kidney disease, stage 3 unspecified: Secondary | ICD-10-CM | POA: Diagnosis present

## 2019-05-01 DIAGNOSIS — J9811 Atelectasis: Secondary | ICD-10-CM | POA: Diagnosis not present

## 2019-05-01 DIAGNOSIS — R471 Dysarthria and anarthria: Secondary | ICD-10-CM | POA: Diagnosis present

## 2019-05-01 DIAGNOSIS — D649 Anemia, unspecified: Secondary | ICD-10-CM | POA: Diagnosis not present

## 2019-05-01 DIAGNOSIS — D508 Other iron deficiency anemias: Secondary | ICD-10-CM | POA: Diagnosis not present

## 2019-05-01 DIAGNOSIS — R4189 Other symptoms and signs involving cognitive functions and awareness: Secondary | ICD-10-CM | POA: Diagnosis present

## 2019-05-01 DIAGNOSIS — E860 Dehydration: Secondary | ICD-10-CM | POA: Diagnosis present

## 2019-05-01 DIAGNOSIS — I6350 Cerebral infarction due to unspecified occlusion or stenosis of unspecified cerebral artery: Secondary | ICD-10-CM | POA: Diagnosis not present

## 2019-05-01 DIAGNOSIS — Z8249 Family history of ischemic heart disease and other diseases of the circulatory system: Secondary | ICD-10-CM

## 2019-05-01 DIAGNOSIS — R52 Pain, unspecified: Secondary | ICD-10-CM | POA: Diagnosis not present

## 2019-05-01 DIAGNOSIS — Z87891 Personal history of nicotine dependence: Secondary | ICD-10-CM | POA: Diagnosis not present

## 2019-05-01 DIAGNOSIS — N179 Acute kidney failure, unspecified: Secondary | ICD-10-CM | POA: Diagnosis present

## 2019-05-01 DIAGNOSIS — G8194 Hemiplegia, unspecified affecting left nondominant side: Secondary | ICD-10-CM | POA: Diagnosis present

## 2019-05-01 DIAGNOSIS — M549 Dorsalgia, unspecified: Secondary | ICD-10-CM | POA: Diagnosis present

## 2019-05-01 DIAGNOSIS — I69318 Other symptoms and signs involving cognitive functions following cerebral infarction: Secondary | ICD-10-CM | POA: Diagnosis not present

## 2019-05-01 DIAGNOSIS — Z888 Allergy status to other drugs, medicaments and biological substances status: Secondary | ICD-10-CM | POA: Diagnosis not present

## 2019-05-01 DIAGNOSIS — K2211 Ulcer of esophagus with bleeding: Secondary | ICD-10-CM | POA: Diagnosis not present

## 2019-05-01 DIAGNOSIS — F32A Depression, unspecified: Secondary | ICD-10-CM | POA: Diagnosis present

## 2019-05-01 DIAGNOSIS — I69328 Other speech and language deficits following cerebral infarction: Secondary | ICD-10-CM | POA: Diagnosis not present

## 2019-05-01 DIAGNOSIS — D638 Anemia in other chronic diseases classified elsewhere: Secondary | ICD-10-CM | POA: Diagnosis not present

## 2019-05-01 DIAGNOSIS — Z833 Family history of diabetes mellitus: Secondary | ICD-10-CM

## 2019-05-01 DIAGNOSIS — M545 Low back pain: Secondary | ICD-10-CM | POA: Diagnosis not present

## 2019-05-01 DIAGNOSIS — I6302 Cerebral infarction due to thrombosis of basilar artery: Secondary | ICD-10-CM | POA: Diagnosis not present

## 2019-05-01 DIAGNOSIS — R410 Disorientation, unspecified: Secondary | ICD-10-CM | POA: Diagnosis not present

## 2019-05-01 DIAGNOSIS — I69354 Hemiplegia and hemiparesis following cerebral infarction affecting left non-dominant side: Secondary | ICD-10-CM | POA: Diagnosis not present

## 2019-05-01 DIAGNOSIS — Z8701 Personal history of pneumonia (recurrent): Secondary | ICD-10-CM

## 2019-05-01 DIAGNOSIS — Z1159 Encounter for screening for other viral diseases: Secondary | ICD-10-CM | POA: Diagnosis not present

## 2019-05-01 DIAGNOSIS — I6522 Occlusion and stenosis of left carotid artery: Secondary | ICD-10-CM

## 2019-05-01 DIAGNOSIS — Z90710 Acquired absence of both cervix and uterus: Secondary | ICD-10-CM

## 2019-05-01 DIAGNOSIS — N170 Acute kidney failure with tubular necrosis: Secondary | ICD-10-CM | POA: Diagnosis not present

## 2019-05-01 DIAGNOSIS — D62 Acute posthemorrhagic anemia: Secondary | ICD-10-CM | POA: Diagnosis not present

## 2019-05-01 DIAGNOSIS — J410 Simple chronic bronchitis: Secondary | ICD-10-CM | POA: Diagnosis not present

## 2019-05-01 HISTORY — DX: Disorientation, unspecified: R41.0

## 2019-05-01 HISTORY — DX: Pneumonia, unspecified organism: J18.9

## 2019-05-01 HISTORY — DX: Wedge compression fracture of unspecified lumbar vertebra, initial encounter for closed fracture: S32.000A

## 2019-05-01 HISTORY — DX: Unspecified fracture of sacrum, initial encounter for closed fracture: S32.10XA

## 2019-05-01 LAB — LIPID PANEL
Cholesterol: 170 mg/dL (ref 0–200)
HDL: 45 mg/dL (ref >40)
LDL Cholesterol: 106 mg/dL — ABNORMAL HIGH (ref 0–99)
Total CHOL/HDL Ratio: 3.8 RATIO
Triglycerides: 96 mg/dL (ref <150)
VLDL: 19 mg/dL (ref 0–40)

## 2019-05-01 LAB — PROTIME-INR
INR: 1 (ref 0.8–1.2)
Prothrombin Time: 13 seconds (ref 11.4–15.2)

## 2019-05-01 LAB — CBC
HCT: 28.5 % — ABNORMAL LOW (ref 36.0–46.0)
Hemoglobin: 8.4 g/dL — ABNORMAL LOW (ref 12.0–15.0)
MCH: 26.3 pg (ref 26.0–34.0)
MCHC: 29.5 g/dL — ABNORMAL LOW (ref 30.0–36.0)
MCV: 89.1 fL (ref 80.0–100.0)
Platelets: 499 10*3/uL — ABNORMAL HIGH (ref 150–400)
RBC: 3.2 MIL/uL — ABNORMAL LOW (ref 3.87–5.11)
RDW: 14 % (ref 11.5–15.5)
WBC: 10.1 10*3/uL (ref 4.0–10.5)
nRBC: 0 % (ref 0.0–0.2)

## 2019-05-01 LAB — COMPREHENSIVE METABOLIC PANEL
ALT: 11 U/L (ref 0–44)
AST: 15 U/L (ref 15–41)
Albumin: 2.9 g/dL — ABNORMAL LOW (ref 3.5–5.0)
Alkaline Phosphatase: 65 U/L (ref 38–126)
Anion gap: 9 (ref 5–15)
BUN: 43 mg/dL — ABNORMAL HIGH (ref 8–23)
CO2: 30 mmol/L (ref 22–32)
Calcium: 8.7 mg/dL — ABNORMAL LOW (ref 8.9–10.3)
Chloride: 103 mmol/L (ref 98–111)
Creatinine, Ser: 1.39 mg/dL — ABNORMAL HIGH (ref 0.44–1.00)
GFR calc Af Amer: 41 mL/min — ABNORMAL LOW (ref >60)
GFR calc non Af Amer: 36 mL/min — ABNORMAL LOW (ref >60)
Glucose, Bld: 116 mg/dL — ABNORMAL HIGH (ref 70–99)
Potassium: 3.4 mmol/L — ABNORMAL LOW (ref 3.5–5.1)
Sodium: 142 mmol/L (ref 135–145)
Total Bilirubin: 0.2 mg/dL — ABNORMAL LOW (ref 0.3–1.2)
Total Protein: 6.1 g/dL — ABNORMAL LOW (ref 6.5–8.1)

## 2019-05-01 LAB — DIFFERENTIAL
Abs Immature Granulocytes: 0.07 10*3/uL (ref 0.00–0.07)
Basophils Absolute: 0.1 10*3/uL (ref 0.0–0.1)
Basophils Relative: 1 %
Eosinophils Absolute: 0.4 10*3/uL (ref 0.0–0.5)
Eosinophils Relative: 4 %
Immature Granulocytes: 1 %
Lymphocytes Relative: 18 %
Lymphs Abs: 1.8 10*3/uL (ref 0.7–4.0)
Monocytes Absolute: 0.8 10*3/uL (ref 0.1–1.0)
Monocytes Relative: 8 %
Neutro Abs: 7 10*3/uL (ref 1.7–7.7)
Neutrophils Relative %: 68 %

## 2019-05-01 LAB — ECHOCARDIOGRAM LIMITED BUBBLE STUDY
Height: 57 in
Weight: 2144.63 oz

## 2019-05-01 LAB — HEMOGLOBIN A1C
Hgb A1c MFr Bld: 6.2 % — ABNORMAL HIGH (ref 4.8–5.6)
Mean Plasma Glucose: 131.24 mg/dL

## 2019-05-01 LAB — I-STAT CREATININE, ED: Creatinine, Ser: 1.3 mg/dL — ABNORMAL HIGH (ref 0.44–1.00)

## 2019-05-01 LAB — CBG MONITORING, ED: Glucose-Capillary: 127 mg/dL — ABNORMAL HIGH (ref 70–99)

## 2019-05-01 LAB — APTT: aPTT: 27 seconds (ref 24–36)

## 2019-05-01 MED ORDER — ASPIRIN EC 81 MG PO TBEC
81.0000 mg | DELAYED_RELEASE_TABLET | Freq: Every day | ORAL | Status: DC
  Administered 2019-05-02: 81 mg via ORAL
  Filled 2019-05-01: qty 1

## 2019-05-01 MED ORDER — IOHEXOL 350 MG/ML SOLN
115.0000 mL | Freq: Once | INTRAVENOUS | Status: AC | PRN
  Administered 2019-05-01: 115 mL via INTRAVENOUS

## 2019-05-01 MED ORDER — ACETAMINOPHEN 325 MG PO TABS
650.0000 mg | ORAL_TABLET | ORAL | Status: DC | PRN
  Administered 2019-05-01 – 2019-05-02 (×3): 650 mg via ORAL
  Filled 2019-05-01 (×3): qty 2

## 2019-05-01 MED ORDER — ATORVASTATIN CALCIUM 80 MG PO TABS
80.0000 mg | ORAL_TABLET | Freq: Every day | ORAL | Status: DC
  Administered 2019-05-01: 80 mg via ORAL
  Filled 2019-05-01: qty 1

## 2019-05-01 MED ORDER — SODIUM CHLORIDE 0.9 % IV SOLN
INTRAVENOUS | Status: DC
  Administered 2019-05-01 (×2): via INTRAVENOUS

## 2019-05-01 MED ORDER — GABAPENTIN 100 MG PO CAPS
100.0000 mg | ORAL_CAPSULE | Freq: Every evening | ORAL | Status: DC | PRN
  Administered 2019-05-01: 100 mg via ORAL
  Filled 2019-05-01: qty 1

## 2019-05-01 MED ORDER — SENNOSIDES-DOCUSATE SODIUM 8.6-50 MG PO TABS: 1.0000 | ORAL_TABLET | Freq: Every evening | ORAL | Status: DC | PRN

## 2019-05-01 MED ORDER — STROKE: EARLY STAGES OF RECOVERY BOOK
Freq: Once | Status: AC
  Administered 2019-05-01: 05:00:00
  Filled 2019-05-01: qty 1

## 2019-05-01 MED ORDER — DULOXETINE HCL 60 MG PO CPEP
60.0000 mg | ORAL_CAPSULE | Freq: Every day | ORAL | Status: DC
  Administered 2019-05-01 – 2019-05-02 (×2): 60 mg via ORAL
  Filled 2019-05-01: qty 1

## 2019-05-01 MED ORDER — ALBUTEROL SULFATE HFA 108 (90 BASE) MCG/ACT IN AERS: 1.0000 | INHALATION_SPRAY | RESPIRATORY_TRACT | Status: DC | PRN

## 2019-05-01 MED ORDER — CLOPIDOGREL BISULFATE 75 MG PO TABS
75.0000 mg | ORAL_TABLET | Freq: Every day | ORAL | Status: DC
  Administered 2019-05-01 – 2019-05-02 (×2): 75 mg via ORAL
  Filled 2019-05-01 (×2): qty 1

## 2019-05-01 MED ORDER — ONDANSETRON HCL 4 MG/2ML IJ SOLN: 4.0000 mg | Freq: Three times a day (TID) | INTRAMUSCULAR | Status: DC | PRN

## 2019-05-01 MED ORDER — DM-GUAIFENESIN ER 30-600 MG PO TB12
1.0000 | ORAL_TABLET | Freq: Two times a day (BID) | ORAL | Status: DC | PRN
  Filled 2019-05-01: qty 1

## 2019-05-01 MED ORDER — DOCUSATE SODIUM 50 MG PO CAPS
250.0000 mg | ORAL_CAPSULE | Freq: Every day | ORAL | Status: DC
  Administered 2019-05-01: 250 mg via ORAL
  Filled 2019-05-01 (×2): qty 1

## 2019-05-01 MED ORDER — IPRATROPIUM BROMIDE 0.02 % IN SOLN: 0.5000 mg | Freq: Four times a day (QID) | RESPIRATORY_TRACT | Status: DC

## 2019-05-01 MED ORDER — ASPIRIN 325 MG PO TABS
325.0000 mg | ORAL_TABLET | Freq: Every day | ORAL | Status: DC
  Administered 2019-05-01: 325 mg via ORAL
  Filled 2019-05-01: qty 1

## 2019-05-01 MED ORDER — ACETAMINOPHEN 650 MG RE SUPP: 650.0000 mg | RECTAL | Status: DC | PRN

## 2019-05-01 MED ORDER — HYDRALAZINE HCL 20 MG/ML IJ SOLN
5.0000 mg | INTRAMUSCULAR | Status: DC | PRN
  Administered 2019-05-01: 5 mg via INTRAVENOUS
  Filled 2019-05-01: qty 1

## 2019-05-01 MED ORDER — ASPIRIN 300 MG RE SUPP: 300.0000 mg | Freq: Every day | RECTAL | Status: DC

## 2019-05-01 MED ORDER — POTASSIUM CHLORIDE CRYS ER 20 MEQ PO TBCR
40.0000 meq | EXTENDED_RELEASE_TABLET | Freq: Once | ORAL | Status: AC
  Administered 2019-05-01: 40 meq via ORAL
  Filled 2019-05-01: qty 2

## 2019-05-01 MED ORDER — ACETAMINOPHEN 160 MG/5ML PO SOLN: 650.0000 mg | ORAL | Status: DC | PRN

## 2019-05-01 MED ORDER — ENOXAPARIN SODIUM 40 MG/0.4ML ~~LOC~~ SOLN
40.0000 mg | SUBCUTANEOUS | Status: DC
  Administered 2019-05-01 – 2019-05-02 (×2): 40 mg via SUBCUTANEOUS
  Filled 2019-05-01 (×2): qty 0.4

## 2019-05-01 MED ORDER — OXYCODONE HCL 5 MG PO TABS
5.0000 mg | ORAL_TABLET | Freq: Four times a day (QID) | ORAL | Status: DC | PRN
  Administered 2019-05-01: 5 mg via ORAL
  Filled 2019-05-01 (×2): qty 1

## 2019-05-01 MED ORDER — SODIUM CHLORIDE 0.9% FLUSH
3.0000 mL | Freq: Once | INTRAVENOUS | Status: AC
Start: 2019-05-01 — End: 2019-05-01
  Administered 2019-05-01: 3 mL via INTRAVENOUS

## 2019-05-01 MED ORDER — ALBUTEROL SULFATE (2.5 MG/3ML) 0.083% IN NEBU: 2.5000 mg | INHALATION_SOLUTION | RESPIRATORY_TRACT | Status: DC | PRN

## 2019-05-01 MED ORDER — VITAMIN B-12 1000 MCG PO TABS
1000.0000 ug | ORAL_TABLET | Freq: Every day | ORAL | Status: DC
  Administered 2019-05-01 – 2019-05-02 (×2): 1000 ug via ORAL
  Filled 2019-05-01 (×2): qty 1

## 2019-05-01 MED ORDER — OXYCODONE HCL 5 MG PO TABS
5.0000 mg | ORAL_TABLET | ORAL | Status: DC | PRN
  Administered 2019-05-01 – 2019-05-02 (×6): 5 mg via ORAL
  Filled 2019-05-01 (×6): qty 1

## 2019-05-01 MED ORDER — ATORVASTATIN CALCIUM 10 MG PO TABS
20.0000 mg | ORAL_TABLET | Freq: Every day | ORAL | Status: DC
  Administered 2019-05-01 – 2019-05-02 (×2): 20 mg via ORAL
  Filled 2019-05-01: qty 2

## 2019-05-01 NOTE — Code Documentation (Signed)
Responded to Code Stroke called at 0142. Pt arrived at 0150 with symptoms of L sided weakness and slurred speech. CBG-116. NIH-13. LSN-1700 on 5/3. CT, CTA, and CTP completed. No bleed or LVO seen.  Plan for MRI and admission by medical team.

## 2019-05-01 NOTE — Evaluation (Signed)
Occupational Therapy Evaluation Patient Details Name: [name] MRN: [id number] DOB: Sep 01, 1936 Today's Date: 05/01/2019    History of Present Illness Pt is an 83 y/o female who presents with L sided weakness and slurred speech. She was found to have an acute R pontine infarct. PMH significant for COPD, osteoporosis, chronic back pain.   Clinical Impression   This 83 yo female admitted with above presents to acute OT with decreased insight into deficits, decreased balance (sitting and standing), decreased functional use of left side, increased pain (x3 areas) all affecting her safety and independence with basic ADLs. She will benefit from acute OT with follow up OT on CIR to work on increasing independence and decreasing burden of care.    Follow Up Recommendations  CIR;Supervision/Assistance - 24 hour    Equipment Recommendations  Other (comment)(TBD next venue)       Precautions / Restrictions Precautions Precautions: Fall Restrictions Weight Bearing Restrictions: No      Mobility Bed Mobility Overal bed mobility: Needs Assistance Bed Mobility: Supine to Sit     Supine to sit: Max assist;+2 for physical assistance     General bed mobility comments: Assist for LE advancement towards EOB, scooting and trunk elevation to full sitting position. Bed pad utilized for scooting assist.   Transfers Overall transfer level: Needs assistance Equipment used: 2 person hand held assist Transfers: Stand Pivot Transfers;Sit to/from Stand Sit to Stand: Max assist;+2 physical assistance Stand pivot transfers: Max assist;+2 physical assistance       General transfer comment: Pt required increased assist to power-up to full stand and maintain standing balance. Stand pivot utilized with bed pad under hips to transition to chair. Lift pad placed under pt for nursing staff return to bed later.    Balance Overall balance assessment: Needs assistance Sitting-balance support: Feet  supported;No upper extremity supported Sitting balance-Leahy Scale: Poor Sitting balance - Comments: Required hands-on assist for sitting balance throughout EOB activity.  Postural control: Posterior lean Standing balance support: Bilateral upper extremity supported;During functional activity Standing balance-Leahy Scale: Zero Standing balance comment: +2 required.                            ADL either performed or assessed with clinical judgement   ADL Overall ADL's : Needs assistance/impaired Eating/Feeding: Total assistance Eating/Feeding Details (indicate cue type and reason): supported sitting--due to pain in Bil shoulders and decreased use of LUE from CVA Grooming: Total assistance Grooming Details (indicate cue type and reason): supported sitting--due to pain in Bil shoulders and decreased use of LUE from CVA Upper Body Bathing: Total assistance Upper Body Bathing Details (indicate cue type and reason): supported sitting--due to pain in Bil shoulders and decreased use of LUE from CVA Lower Body Bathing: Total assistance Lower Body Bathing Details (indicate cue type and reason): Max A +2 sit<>stand Upper Body Dressing : Total assistance Upper Body Dressing Details (indicate cue type and reason): supported sitting--due to pain in Bil shoulders and decreased use of LUE from CVA Lower Body Dressing: Total assistance Lower Body Dressing Details (indicate cue type and reason): Max A +2 sit<>stand Toilet Transfer: Maximal assistance;+2 for physical assistance;Squat-pivot Toilet Transfer Details (indicate cue type and reason): bed>recliner going to pt's right Toileting- Clothing Manipulation and Hygiene: Total assistance Toileting - Clothing Manipulation Details (indicate cue type and reason): Max A +2 sit<>stand             Vision Baseline Vision/History: Wears glasses  Wears Glasses: At all times Patient Visual Report: No change from baseline Additional Comments:  Needs to be further assesed--at end of session when found glasses and had pt put them on she said these aren't mine (then when she asked what color they are I told her grey and she said that hers are grey too)            Pertinent Vitals/Pain Pain Assessment: Faces Faces Pain Scale: Hurts even more Pain Location: Back (chronic) and Bil shoulders at rest and with movement Pain Descriptors / Indicators: Discomfort;Grimacing;Aching;Sore Pain Intervention(s): Limited activity within patient's tolerance;Monitored during session;Repositioned     Hand Dominance Right   Extremity/Trunk Assessment Upper Extremity Assessment Upper Extremity Assessment: RUE deficits/detail;LUE deficits/detail RUE Deficits / Details: only tolerating minimal movement at shoulder before reporting pain (reports it was not this way pta) RUE Coordination: decreased fine motor;decreased gross motor LUE Deficits / Details: only tolerating minimal movement at shoulder before reporting pain (reports it was not this way pta); sublux noted, minimal movement noted throughout LUE Coordination: decreased gross motor;decreased fine motor   Lower Extremity Assessment Lower Extremity Assessment: LLE deficits/detail LLE Deficits / Details: Minimal active movement on eval   Cervical / Trunk Assessment Cervical / Trunk Assessment: Kyphotic   Communication Communication Communication: Expressive difficulties   Cognition Arousal/Alertness: Awake/alert Behavior During Therapy: Anxious(emotional at times) Overall Cognitive Status: Impaired/Different from baseline Area of Impairment: Orientation;Memory;Attention;Following commands;Safety/judgement;Awareness;Problem solving                 Orientation Level: Disoriented to;Situation Current Attention Level: Sustained Memory: Decreased short-term memory Following Commands: Follows one step commands inconsistently;Follows one step commands with increased  time Safety/Judgement: Decreased awareness of safety;Decreased awareness of deficits Awareness: Intellectual Problem Solving: Slow processing;Decreased initiation;Requires verbal cues;Requires tactile cues;Difficulty sequencing General Comments: Asked multiple times "What's wrong with me?" and required redirection to situation. Poor awareness of safety, thinking she could walk after it took 2 people to transfer to the chair only,              Home Living Family/patient expects to be discharged to:: Private residence Living Arrangements: Children(son) Available Help at Discharge: Available 24 hours/day Type of Home: House Home Access: Stairs to enter [employer] of Steps: 2 Entrance Stairs-Rails: Right Home Layout: One level     Bathroom Shower/Tub: Teacher, early years/pre: [place]: Environmental consultant - 2 wheels;Cane - single point;Bedside commode;Walker - 4 wheels   Additional Comments: Some information taken from prior PT/OT notes from previous admission in September 2019.       Prior Functioning/Environment Level of Independence: Needs assistance  Gait / Transfers Assistance Needed: Pt using a rollator for ambulation ADL's / Homemaking Assistance Needed: Pt reports she was independent with BADL's but son assisted with IADL's.             OT Problem List: Decreased strength;Decreased range of motion;Impaired balance (sitting and/or standing);Impaired vision/perception;Decreased coordination;Decreased cognition;Decreased safety awareness;Decreased knowledge of use of DME or AE;Pain;Impaired UE functional use      OT Treatment/Interventions: Self-care/ADL training;Balance training;Therapeutic exercise;Neuromuscular education;Therapeutic activities;Patient/family education;Visual/perceptual remediation/compensation;Cognitive remediation/compensation    OT Goals(Current goals can be found in the care plan section) Acute Rehab OT Goals Patient  Stated Goal: Not go to a nursing home OT Goal Formulation: With patient Time For Goal Achievement: 05/15/19 Potential to Achieve Goals: Fair  OT Frequency: Min 2X/week           Co-evaluation PT/OT/SLP Co-Evaluation/Treatment: Yes  Reason for Co-Treatment: Complexity of the patient's impairments (multi-system involvement);Necessary to address cognition/behavior during functional activity;For patient/therapist safety   OT goals addressed during session: ADL's and self-care;Strengthening/ROM      AM-PAC OT "6 Clicks" Daily Activity     Outcome Measure Help from another person eating meals?: Total Help from another person taking care of personal grooming?: Total Help from another person toileting, which includes using toliet, bedpan, or urinal?: Total Help from another person bathing (including washing, rinsing, drying)?: Total Help from another person to put on and taking off regular upper body clothing?: Total Help from another person to put on and taking off regular lower body clothing?: Total 6 Click Score: 6   End of Session Equipment Utilized During Treatment: Gait belt Nurse Communication: Mobility status;Need for lift equipment(maxi move--pad left in chair with pt)  Activity Tolerance: Patient tolerated treatment well Patient left: in chair;with call bell/phone within reach;with chair alarm set  OT Visit Diagnosis: Unsteadiness on feet (R26.81);Other abnormalities of gait and mobility (R26.89);Muscle weakness (generalized) (M62.81);Low vision, both eyes (H54.2);Other symptoms and signs involving cognitive function;Hemiplegia and hemiparesis;Pain Hemiplegia - Right/Left: Left Hemiplegia - dominant/non-dominant: Non-Dominant Hemiplegia - caused by: Cerebral infarction Pain - part of body: (both shoulders and back (chronic but worse per pt))                Time: 1011-1050 OT Time Calculation (min): 39 min Charges:  OT General Charges $OT Visit: 1 Visit OT Evaluation $OT  Eval Moderate Complexity: 1 Mod  [place], OTR/L Acute [employer] Pager [phone] Office [phone]     Almon Register 05/01/2019, 3:40 PM

## 2019-05-01 NOTE — Evaluation (Signed)
Speech Language Pathology Evaluation Patient Details Name: [name] MRN: [id number] DOB: 1936-06-29 Today's Date: 05/01/2019 Time: 0347-4259 SLP Time Calculation (min) (ACUTE ONLY): 35 min  Problem List:  Patient Active Problem List   Diagnosis Date Noted  . Stroke ([place]) 05/01/2019  . Acute renal failure superimposed on stage 3 chronic kidney disease ([place]) 05/01/2019  . Acute ischemic stroke ([place]) 05/01/2019  . AKI (acute kidney injury) ([place]) 08/29/2018  . Encephalopathy acute 08/22/2018  . Chronic back pain 08/22/2018  . Vitamin B12 deficiency 08/19/2018  . Dehydration   . Tachycardia   . Altered mental state 08/18/2018  . Closed compression fracture of L1 vertebra ([facility])   . Acute cystitis without hematuria   . Pain management   . Intractable pain   . Back pain 10/10/2016  . L2 vertebral fracture ([place]) 10/10/2016  . Acute encephalopathy 12/25/2015  . Drug-induced delirium ([place]) 12/25/2015  . Acute on chronic respiratory failure ([place]) 12/25/2015  . CAP (community acquired pneumonia) 12/19/2015  . Chronic respiratory failure ([place]) 03/19/2014  . Depression 03/19/2014  . Aspiration pneumonia ([place]) 03/10/2014  . Acute respiratory failure with hypoxia ([place]) 03/10/2014  . Sepsis ([place]) 03/10/2014  . Hypokalemia 03/10/2014  . Hypertension 03/10/2014  . COPD (chronic obstructive pulmonary disease) ([place]) 03/10/2014  . Tobacco abuse 03/10/2014  . Normocytic anemia 06/15/2013  . Generalized abdominal pain 06/15/2013  . Blood in the stool 06/15/2013   Past Medical History:  Past Medical History:  Diagnosis Date  . Chronic back pain   . Chronic back pain 08/22/2018  . Colitis 03/19/2014  . COPD (chronic obstructive pulmonary disease) ([place])   . Hypertension   . On home oxygen therapy 06-14-13   continuos 2.5l/m nasally-24/7  . Osteoporosis   . Oxygen dependent    Past Surgical History:  Past Surgical History:  Procedure Laterality Date  . ABDOMINAL HYSTERECTOMY    .  ESOPHAGOGASTRODUODENOSCOPY N/A 06/15/2013   Procedure: ESOPHAGOGASTRODUODENOSCOPY (EGD);  Surgeon: [name], MD;  Location: [facility];  Service: Endoscopy;  Laterality: N/A;  . IR GENERIC HISTORICAL  10/15/2016   IR KYPHO LUMBAR INC FX REDUCE BONE BX UNI/BIL CANNULATION INC/IMAGING 10/15/2016 Luanne Bras, MD [facility]  . sacroplasty  06-14-13   05-10-13 IVR [facility] for fracture stabilization   HPI:  Pt is an 83 y/o female who presents with L sided weakness and slurred speech. She was found to have an acute R pontine infarct. PMH significant for COPD, osteoporosis, chronic back pain.   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 Patient's voice is impacted by her COPD with poor respiratory support, resulting in forceful phonation, halting speech, inability to produce continuous phonation. Patient's dysarthria and accompanying voice impairment result in speech intelligibilty at conversational and sentence level to be approximately 40-50%, and 70% at word and 2-3 word phrase level. Patient also presents with a mild cognitive impairment which may be premorbid and may also be impacted by her anxiety and restlessness. Patient exhibited difficulties in short term memory, alternating attention, thought organization and planning, and although she is aware of some of her impairments, she does not demonstrate adequate emergent or anticipatory awareness to how her deficits impact her functioning. At this time, patient's cognitive and speech deficits significantly impact her ability to communicate her wants/needs /thoughts and significantly decrease her overall safety with ADL.    SLP Assessment  SLP Recommendation/Assessment: Patient needs continued [facility] SLP Visit  Diagnosis: Cognitive communication deficit (R41.841);Dysarthria and anarthria (R47.1)    Follow Up  Recommendations  Inpatient Rehab;24 hour supervision/assistance    Frequency and Duration min 2x/week  1 week      SLP Evaluation Cognition  Overall Cognitive Status: Impaired/Different from baseline Arousal/Alertness: Awake/alert Orientation Level: Oriented X4 Attention: Selective;Alternating Selective Attention: Appears intact Alternating Attention: Impaired Alternating Attention Impairment: Verbal complex Memory: Impaired Memory Impairment: Storage deficit;Decreased recall of new information;Retrieval deficit Awareness: Impaired Awareness Impairment: Emergent impairment Problem Solving: Appears intact Executive Function: Organizing;Self Monitoring Organizing: Impaired Organizing Impairment: Verbal complex Self Monitoring: Impaired Self Monitoring Impairment: Verbal complex;Functional basic Behaviors: Restless Safety/Judgment: Impaired       Comprehension  Auditory Comprehension Overall Auditory Comprehension: Appears within functional limits for tasks assessed    Expression Expression Primary Mode of Expression: Verbal Verbal Expression Overall Verbal Expression: Appears within functional limits for tasks assessed Written Expression Dominant Hand: Right   Oral / Motor  Oral Motor/Sensory Function Overall Oral Motor/Sensory Function: Moderate impairment Facial ROM: Reduced left Facial Symmetry: Abnormal symmetry left Facial Strength: Reduced left Lingual ROM: Reduced left Lingual Symmetry: Abnormal symmetry right Lingual Strength: Reduced Velum: Within Functional Limits Mandible: Within Functional Limits Motor Speech Overall Motor Speech: Impaired Respiration: Impaired Level of Impairment: Phrase Phonation: Other (comment)(forceful phonation secondary to poor respiratory support for voicing) Resonance: Within functional limits Articulation: Impaired Level of Impairment: Phrase Intelligibility: Intelligibility reduced Word: 75-100% accurate Phrase: 50-74%  accurate Sentence: 50-74% accurate Conversation: 25-49% accurate Motor Planning: Witnin functional limits Motor Speech Errors: Not applicable Effective Techniques: Slow rate;Over-articulate   GO                    Dannial Monarch 05/01/2019, 5:46 PM  Sonia Baller, MA, CCC-SLP Speech Therapy [facility] Acute Rehab Pager: [phone]

## 2019-05-01 NOTE — Consult Note (Signed)
Neurology Consultation Reason for Consult: Left-sided weakness Referring Physician: Delora Fuel  CC: Weakness on the left  History is obtained from:EMS, patient   HPI: [name] is a 83 y.o. female with a history of previous stroke, hypertension, COPD who presents with left-sided weakness that started around 5 PM today.  Her son is her caregiver and noted that she was slurring her speech.  The patient also confirms that her symptoms began around 5 PM.  She apparently has some problems with her left side at baseline, but it is predominantly pain and she is able to walk typically but now is unable to.  911 was called and she was brought in as a code stroke.  CT/CTA revealed no LVO or acute hemorrhage.   LKW: 5 PM tpa given?: no, outside of window   ROS: A 14 point ROS was performed and is negative except as noted in the HPI.   Past Medical History:  Diagnosis Date  . Chronic back pain   . Chronic back pain 08/22/2018  . Colitis 03/19/2014  . COPD (chronic obstructive pulmonary disease) ([place])   . Hypertension   . On home oxygen therapy 06-14-13   continuos 2.5l/m nasally-24/7  . Osteoporosis   . Oxygen dependent     No family history on file.   Social History:  reports that she quit smoking about 7 years ago. Her smoking use included cigarettes. She has never used smokeless tobacco. She reports that she does not drink alcohol or use drugs.   Exam: Current vital signs: There were no vitals taken for this visit. Vital signs in last 24 hours:     Physical Exam  Constitutional: Appears well-developed and well-nourished.  Psych: Affect appropriate to situation Eyes: No scleral injection HENT: No OP obstrucion Head: Normocephalic.  Cardiovascular: Normal rate and regular rhythm.  Respiratory: Effort normal, non-labored breathing GI: Soft.  No distension. There is no tenderness.  Skin: WDI  Neuro: Mental Status: Patient is awake, alert, oriented to person, place,  month, age She is dysarthric, but no clear aphasia, she does not have any obvious signs of neglect Cranial Nerves: II: Visual Fields are full. Pupils are equal, round, and reactive to light.   III,IV, VI: EOMI without ptosis or diploplia.  V: Facial sensation is symmetric to temperature VII: Facial movement with severe left-sided weakness VIII: hearing is intact to voice X: Uvula elevates symmetrically XI: Shoulder shrug is symmetric. XII: tongue is midline without atrophy or fasciculations.  Motor: She has 4/5 strength of the right arm and leg, she has no movement of the left arm and minimal withdrawal versus triple flexion of the left leg Sensory: Sensation is symmetric to light touch and temperature in the arms and legs. Cerebellar: She has mild difficulty with finger-nose-finger on the right without past-pointing   I have reviewed labs in epic and the results pertinent to this consultation are: Creatinine 1.3  I have reviewed the images obtained: CT head-unremarkable  Impression: 83 year old female with acute left-sided weakness most consistent with acute ischemic stroke.  She has fairly severe symptoms and will need secondary risk factor modification and therefore will be admitted.  Recommendations: - HgbA1c, fasting lipid panel - MRI of the brain without contrast - Frequent neuro checks - Echocardiogram - Prophylactic therapy-Antiplatelet med: Aspirin - dose 325mg  PO or 300mg  PR - Risk factor modification - Telemetry monitoring - PT consult, OT consult, Speech consult - Stroke team to follow    Roland Rack, MD [facility]  [phone]  If 7pm- 7am, please page neurology on call as listed in [place].

## 2019-05-01 NOTE — Progress Notes (Signed)
Patient admitted after midnight with left sided weakness and slurred speech. MRI reveals Acute right pontine infarction. Work up in progress. Symptoms stable. Await neurology input  Gen: awake alert oriented xr CV: rrr no mgr no LE edema Neuro: left sided facial droop, speech quite slurred. Left grip 0-1/5 and right 4/5. Left leg strength 1/5. Right leg strength 3/5.    Santiago Glad m. Dontavion N[place]on, NP

## 2019-05-01 NOTE — Progress Notes (Addendum)
STROKE TEAM PROGRESS NOTE   INTERVAL HISTORY Pt sitting in chair, on nasal cannula.  She still has dysarthria, left facial droop and left sided weakness.  Vitals:   05/01/19 0435 05/01/19 0623 05/01/19 0833 05/01/19 0922  BP: (!) 138/59 113/78 (!) 188/172 121/70  Pulse: 79 75 72   Resp: 18  18   Temp: 98.2 F (36.8 C)  97.7 F (36.5 C)   TempSrc: Oral  Axillary   SpO2: 100% 100% 98%   Weight: 60.8 kg     Height: 4\' 9"  (1.448 m)       CBC:  Recent Labs  Lab 05/01/19 0152  WBC 10.1  NEUTROABS 7.0  HGB 8.4*  HCT 28.5*  MCV 89.1  PLT 499*    Basic Metabolic Panel:  Recent Labs  Lab 05/01/19 0152 05/01/19 0201  NA 142  --   K 3.4*  --   CL 103  --   CO2 30  --   GLUCOSE 116*  --   BUN 43*  --   CREATININE 1.39* 1.30*  CALCIUM 8.7*  --    Lipid Panel:     Component Value Date/Time   CHOL 170 05/01/2019 0518   TRIG 96 05/01/2019 0518   HDL 45 05/01/2019 0518   CHOLHDL 3.8 05/01/2019 0518   VLDL 19 05/01/2019 0518   LDLCALC 106 (H) 05/01/2019 0518   HgbA1c:  Lab Results  Component Value Date   HGBA1C 6.2 (H) 05/01/2019   Urine Drug Screen:     Component Value Date/Time   LABOPIA POSITIVE (A) 08/29/2018 2013   COCAINSCRNUR NONE DETECTED 08/29/2018 2013   LABBENZ NONE DETECTED 08/29/2018 2013   AMPHETMU NONE DETECTED 08/29/2018 2013   THCU NONE DETECTED 08/29/2018 2013   LABBARB NONE DETECTED 08/29/2018 2013    Alcohol Level No results found for: ETH  IMAGING Ct Angio Head W Or Wo Contrast  Result Date: 05/01/2019 CLINICAL DATA:  83 y/o  F; left-sided weakness and slurred speech. EXAM: CT ANGIOGRAPHY HEAD AND NECK CT PERFUSION BRAIN TECHNIQUE: Multidetector CT imaging of the head and neck was performed using the standard protocol during bolus administration of intravenous contrast. Multiplanar CT image reconstructions and MIPs were obtained to evaluate the vascular anatomy. Carotid stenosis measurements (when applicable) are obtained utilizing NASCET  criteria, using the distal internal carotid diameter as the denominator. Multiphase CT imaging of the brain was performed following IV bolus contrast injection. Subsequent parametric perfusion maps were calculated using RAPID software. CONTRAST:  168mL OMNIPAQUE IOHEXOL 350 MG/ML SOLN COMPARISON:  05/01/2019 CT head FINDINGS: CTA NECK FINDINGS Aortic arch: Left vertebral artery arises from the aorta. Imaged portion shows no evidence of aneurysm or dissection. No significant stenosis of the major arch vessel origins. Aortic calcific atherosclerosis. Right carotid system: No evidence of dissection, stenosis (50% or greater) or occlusion. Left carotid system: Mixed plaque of left carotid bifurcation with moderate 50-70% proximal ICA stenosis. Vertebral arteries: Codominant. No evidence of dissection, stenosis (50% or greater) or occlusion. Mild stenosis of proximal right V2. calcified plaque of left vertebral artery origin with mild stenosis. Skeleton: No acute finding. Other neck: Negative. Upper chest: Centrilobular emphysema. Review of the MIP images confirms the above findings CTA HEAD FINDINGS Anterior circulation: Calcific atherosclerosis of the carotid siphons with moderate right and mild left paraclinoid ICA stenosis. Moderate right A2 and mild left A2 stenosis. Otherwise no significant stenosis, proximal occlusion, aneurysm, or vascular malformation. Posterior circulation: Moderate right P1 and mild left P1 stenosis. Upper  basilar mild stenosis. Otherwise no significant stenosis, proximal occlusion, aneurysm, or vascular malformation. Venous sinuses: As permitted by contrast timing, patent. Anatomic variants: None significant. Review of the MIP images confirms the above findings CT Brain Perfusion Findings: ASPECTS: 10 CBF (<30%) Volume: 13mL Perfusion (Tmax>6.0s) volume: 47mL Mismatch Volume: 63mL Infarction Location:Not applicable. IMPRESSION: CT brain perfusion: 1. No acute stroke or significant oligemia by  standard perfusion criteria. ASPECTS is 10. CTA head: 1. No large vessel occlusion, aneurysm, or vascular malformation identified. 2. Extensive intracranial atherosclerosis with multiple areas of mild-to-moderate stenosis in the anterior and posterior circulations. CTA neck: 1. Patent carotid and vertebral arteries. No dissection, occlusion, or aneurysm. 2. Left proximal ICA moderate 50-70% stenosis with mixed plaque. 3. Segments of mild stenosis of proximal bilateral vertebral arteries. 4. Aortic Atherosclerosis (ICD10-I70.0) and Emphysema (ICD10-J43.9). These results were called by telephone at the time of interpretation on 05/01/2019 at 2:37 am to Dr. Roland Rack , who verbally acknowledged these results. Electronically Signed   By: Kristine Garbe M.D.   On: 05/01/2019 02:41   Ct Angio Neck W Or Wo Contrast  Result Date: 05/01/2019 CLINICAL DATA:  83 y/o  F; left-sided weakness and slurred speech. EXAM: CT ANGIOGRAPHY HEAD AND NECK CT PERFUSION BRAIN TECHNIQUE: Multidetector CT imaging of the head and neck was performed using the standard protocol during bolus administration of intravenous contrast. Multiplanar CT image reconstructions and MIPs were obtained to evaluate the vascular anatomy. Carotid stenosis measurements (when applicable) are obtained utilizing NASCET criteria, using the distal internal carotid diameter as the denominator. Multiphase CT imaging of the brain was performed following IV bolus contrast injection. Subsequent parametric perfusion maps were calculated using RAPID software. CONTRAST:  136mL OMNIPAQUE IOHEXOL 350 MG/ML SOLN COMPARISON:  05/01/2019 CT head FINDINGS: CTA NECK FINDINGS Aortic arch: Left vertebral artery arises from the aorta. Imaged portion shows no evidence of aneurysm or dissection. No significant stenosis of the major arch vessel origins. Aortic calcific atherosclerosis. Right carotid system: No evidence of dissection, stenosis (50% or greater) or  occlusion. Left carotid system: Mixed plaque of left carotid bifurcation with moderate 50-70% proximal ICA stenosis. Vertebral arteries: Codominant. No evidence of dissection, stenosis (50% or greater) or occlusion. Mild stenosis of proximal right V2. calcified plaque of left vertebral artery origin with mild stenosis. Skeleton: No acute finding. Other neck: Negative. Upper chest: Centrilobular emphysema. Review of the MIP images confirms the above findings CTA HEAD FINDINGS Anterior circulation: Calcific atherosclerosis of the carotid siphons with moderate right and mild left paraclinoid ICA stenosis. Moderate right A2 and mild left A2 stenosis. Otherwise no significant stenosis, proximal occlusion, aneurysm, or vascular malformation. Posterior circulation: Moderate right P1 and mild left P1 stenosis. Upper basilar mild stenosis. Otherwise no significant stenosis, proximal occlusion, aneurysm, or vascular malformation. Venous sinuses: As permitted by contrast timing, patent. Anatomic variants: None significant. Review of the MIP images confirms the above findings CT Brain Perfusion Findings: ASPECTS: 10 CBF (<30%) Volume: 5mL Perfusion (Tmax>6.0s) volume: 21mL Mismatch Volume: 36mL Infarction Location:Not applicable. IMPRESSION: CT brain perfusion: 1. No acute stroke or significant oligemia by standard perfusion criteria. ASPECTS is 10. CTA head: 1. No large vessel occlusion, aneurysm, or vascular malformation identified. 2. Extensive intracranial atherosclerosis with multiple areas of mild-to-moderate stenosis in the anterior and posterior circulations. CTA neck: 1. Patent carotid and vertebral arteries. No dissection, occlusion, or aneurysm. 2. Left proximal ICA moderate 50-70% stenosis with mixed plaque. 3. Segments of mild stenosis of proximal bilateral vertebral arteries. 4. Aortic Atherosclerosis (  ICD10-I70.0) and Emphysema (ICD10-J43.9). These results were called by telephone at the time of interpretation on  05/01/2019 at 2:37 am to Dr. Roland Rack , who verbally acknowledged these results. Electronically Signed   By: Kristine Garbe M.D.   On: 05/01/2019 02:41   Mr Brain Wo Contrast  Result Date: 05/01/2019 CLINICAL DATA:  Left-sided weakness and slurred speech EXAM: MRI HEAD WITHOUT CONTRAST TECHNIQUE: Multiplanar, multiecho pulse sequences of the brain and surrounding structures were obtained without intravenous contrast. COMPARISON:  CTA head neck from earlier today FINDINGS: Brain: Moderate acute infarction in the right pons respecting the midline. No prior infarct is seen. There is minimal chronic small vessel ischemic type change in the cerebral white matter. Brain volume is normal. No acute hemorrhage, hydrocephalus, or masslike finding. Vascular: Major flow voids are preserved. CTA was performed earlier today Skull and upper cervical spine: Negative for marrow lesion. Degenerative cervical facet spurring. Sinuses/Orbits: Bilateral mastoid opacification with negative nasopharynx. Mastoid opacification was also seen on head CT 1 year prior. Other: Intermittent motion degradation. IMPRESSION: Acute right pontine infarction. Electronically Signed   By: Monte Fantasia M.D.   On: 05/01/2019 04:28   Ct Cerebral Perfusion W Contrast  Result Date: 05/01/2019 CLINICAL DATA:  83 y/o  F; left-sided weakness and slurred speech. EXAM: CT ANGIOGRAPHY HEAD AND NECK CT PERFUSION BRAIN TECHNIQUE: Multidetector CT imaging of the head and neck was performed using the standard protocol during bolus administration of intravenous contrast. Multiplanar CT image reconstructions and MIPs were obtained to evaluate the vascular anatomy. Carotid stenosis measurements (when applicable) are obtained utilizing NASCET criteria, using the distal internal carotid diameter as the denominator. Multiphase CT imaging of the brain was performed following IV bolus contrast injection. Subsequent parametric perfusion maps were  calculated using RAPID software. CONTRAST:  135mL OMNIPAQUE IOHEXOL 350 MG/ML SOLN COMPARISON:  05/01/2019 CT head FINDINGS: CTA NECK FINDINGS Aortic arch: Left vertebral artery arises from the aorta. Imaged portion shows no evidence of aneurysm or dissection. No significant stenosis of the major arch vessel origins. Aortic calcific atherosclerosis. Right carotid system: No evidence of dissection, stenosis (50% or greater) or occlusion. Left carotid system: Mixed plaque of left carotid bifurcation with moderate 50-70% proximal ICA stenosis. Vertebral arteries: Codominant. No evidence of dissection, stenosis (50% or greater) or occlusion. Mild stenosis of proximal right V2. calcified plaque of left vertebral artery origin with mild stenosis. Skeleton: No acute finding. Other neck: Negative. Upper chest: Centrilobular emphysema. Review of the MIP images confirms the above findings CTA HEAD FINDINGS Anterior circulation: Calcific atherosclerosis of the carotid siphons with moderate right and mild left paraclinoid ICA stenosis. Moderate right A2 and mild left A2 stenosis. Otherwise no significant stenosis, proximal occlusion, aneurysm, or vascular malformation. Posterior circulation: Moderate right P1 and mild left P1 stenosis. Upper basilar mild stenosis. Otherwise no significant stenosis, proximal occlusion, aneurysm, or vascular malformation. Venous sinuses: As permitted by contrast timing, patent. Anatomic variants: None significant. Review of the MIP images confirms the above findings CT Brain Perfusion Findings: ASPECTS: 10 CBF (<30%) Volume: 80mL Perfusion (Tmax>6.0s) volume: 58mL Mismatch Volume: 38mL Infarction Location:Not applicable. IMPRESSION: CT brain perfusion: 1. No acute stroke or significant oligemia by standard perfusion criteria. ASPECTS is 10. CTA head: 1. No large vessel occlusion, aneurysm, or vascular malformation identified. 2. Extensive intracranial atherosclerosis with multiple areas of  mild-to-moderate stenosis in the anterior and posterior circulations. CTA neck: 1. Patent carotid and vertebral arteries. No dissection, occlusion, or aneurysm. 2. Left proximal ICA moderate 50-70%  stenosis with mixed plaque. 3. Segments of mild stenosis of proximal bilateral vertebral arteries. 4. Aortic Atherosclerosis (ICD10-I70.0) and Emphysema (ICD10-J43.9). These results were called by telephone at the time of interpretation on 05/01/2019 at 2:37 am to Dr. Roland Rack , who verbally acknowledged these results. Electronically Signed   By: Kristine Garbe M.D.   On: 05/01/2019 02:41   Ct Head Code Stroke Wo Contrast  Result Date: 05/01/2019 CLINICAL DATA:  Code stroke. 83 y/o F; left-sided weakness and slurred speech. EXAM: CT HEAD WITHOUT CONTRAST TECHNIQUE: Contiguous axial images were obtained from the base of the skull through the vertex without intravenous contrast. COMPARISON:  08/29/2018 CT head. FINDINGS: Brain: Interval small age-indeterminate infarcts are present within the right caudate head and left cerebellar hemisphere. Stable small chronic infarct within the left anterior putamen. No large acute vascular territory infarction, hemorrhage, mass effect, extra-axial collection, hydrocephalus, or herniation. Stable nonspecific white matter hypodensities compatible with chronic microvascular ischemic changes and stable volume loss of the brain. Vascular: Calcific atherosclerosis of carotid siphons and vertebral arteries. No hyperdense vessel identified. Skull: Normal. Negative for fracture or focal lesion. Sinuses/Orbits: Mucosal thickening of the right maxillary sinus and right anterior ethmoid air cells. Chronic inflammatory changes of the walls of the right maxillary sinus. Opacification of bilateral mastoid air cells. Orbits are unremarkable. Other: None. ASPECTS [facility] Stroke Program Early CT Score) - Ganglionic level infarction (caudate, lentiform nuclei, internal capsule,  insula, M1-M3 cortex): 7 - Supraganglionic infarction (M4-M6 cortex): 3 Total score (0-10 with 10 being normal): 10 IMPRESSION: 1. Interval small age-indeterminate infarcts within the right caudate head and left cerebellar hemisphere. 2. Otherwise no large acute stroke, hemorrhage, or mass effect identified. ASPECTS is 10. 3. Stable chronic microvascular ischemic changes and parenchymal volume loss of the brain. These results were called by telephone at the time of interpretation on 05/01/2019 at 2:06 am to Dr. Leonel Ramsay, who verbally acknowledged these results. Electronically Signed   By: Kristine Garbe M.D.   On: 05/01/2019 02:06   2D Echocardiogram with bubble  1. The left ventricle has normal systolic function, with an ejection fraction of 60-65%. The cavity size was normal. Left ventricular diastolic Doppler parameters are consistent with impaired relaxation.  2. The right ventricle has normal systolc function. The cavity was normal. There is no increase in right ventricular wall thickness. Right ventricular systolic pressure could not be assessed.  3. Left atrial size was mildly dilated.  4. The aortic valve is tricuspid Mild thickening of the aortic valve Mild calcification of the aortic valve.  5. The ascending aorta and aortic root are normal in size and structure.   PHYSICAL EXAM  Temp:  [96.6 F (35.9 C)-98.2 F (36.8 C)] 96.6 F (35.9 C) (05/04 1100) Pulse Rate:  [72-87] 76 (05/04 1100) Resp:  [17-18] 18 (05/04 1100) BP: (92-188)/(47-172) 108/68 (05/04 1100) SpO2:  [98 %-100 %] 100 % (05/04 1100) Weight:  [60.8 kg] 60.8 kg (05/04 0435)  General - Well nourished, well developed, in no apparent distress.  Ophthalmologic - fundi not visualized due to noncooperation.  Cardiovascular - Regular rate and rhythm.  Mental Status -  Level of arousal and orientation to time, place, and person were intact. Language including expression, naming, repetition, comprehension was  assessed and found intact.  Moderate dysarthria  Cranial Nerves II - XII - II - Visual field intact OU. III, IV, VI - Extraocular movements intact. V - Facial sensation intact bilaterally. VII - left facial droop. VIII - Hearing &  vestibular intact bilaterally. X - Palate elevates symmetrically, moderate dysarthria. XI - Chin turning & shoulder shrug intact bilaterally. XII - Tongue protrusion to the left.  Motor Strength - The patient's strength was normal in right upper and lower extremities, however, left upper extremity deltoid 0/5, bicep 2/5, tricep 0/5 and finger grip 2-3/5.  Left lower extremity 2-/5 proximal and distal.  Bulk was normal and fasciculations were absent.   Motor Tone - Muscle tone was assessed at the neck and appendages and was normal.  Reflexes - The patient's reflexes were symmetrical in all extremities and she had no pathological reflexes.  Sensory - Light touch, temperature/pinprick were assessed and were symmetrical.    Coordination - The patient had normal movements in the right hand with no ataxia or dysmetria.  Tremor was absent.  Gait and Station - deferred.   ASSESSMENT/PLAN [name] is a 83 y.o. female with history of previous stroke, HTN, COPD presenting with L sided weakness.   Stroke:  right pontine infarct secondary to small vessel disease source  Code Stroke CT head interval small R caudate head and L cerebellar infarcts. Small vessel disease. Atrophy. ASPECTS 10.     CTA head no LVO. Extensive atherosclerosis with mult areas of mold to mod stenosis anterior and posterior circulation  CTA neck no LVO. prox L ICA 50-70% stenosis w/ mixed plaque. Mild prox BA stenosis. Aortic atherosclerosis. Emphysema.  CT perfusion no acute stroke  MRI  R pontine infarct  2D Echo EF 60 to 65%  LDL 106  HgbA1c 6.2  Lovenox 40 mg sq daily for VTE prophylaxis  On diet  No antithrombotic prior to admission, now on aspirin 81 mg daily and  clopidogrel 75 mg daily. Continue DAPT x 3 weels then aspirin alone  Therapy recommendations:  pending   Disposition:  pending   Asymptomatic left carotid stenosis  CTA neck showed left ICA 50 to 70% stenosis  Asymptomatic to the stroke this time  Recommend outpatient follow-up with vascular surgery for monitoring  Hypertension  Stable . Permissive hypertension (OK if < 220/120) but gradually normalize in 5-7 days . Long-term BP goal normotensive  Hyperlipidemia  Home meds:  No statin  Now on lipitor 20  LDL 106, goal < 70  Continue statin at discharge  Other Stroke Risk Factors  Advanced age  Former Cigarette smoker, quit 7 yrs ago  Obesity, Body mass index is 29.01 kg/m., recommend weight loss, diet and exercise as appropriate   Hx stroke/TIA  No documentation in Epic/Care Everywhere  Other Active Problems  COPD on home O2  ARF on CKD stage III Cr 1.39  Normocytic Anemia Hgb 8.4  Hypokalemia 3.4  Depression on cymbalta   Hospital day # 0  Neurology will sign off. Please call with questions. Pt will follow up with stroke clinic NP at [facility] in about 4 weeks. Thanks for the consult.  Rosalin Hawking, MD PhD Stroke Neurology 05/01/2019 1:36 PM    To contact Stroke Continuity provider, please refer to [email or web address] After hours, contact General Neurology

## 2019-05-01 NOTE — Progress Notes (Signed)
  Echocardiogram 2D Echocardiogram has been performed.  [name] 05/01/2019, 9:41 AM

## 2019-05-01 NOTE — TOC Initial Note (Signed)
Transition of Care [facility]) - Initial/Assessment Note    Patient Details  Name: [name] MRN: [id number] Date of Birth: 04/30/1936  Transition of Care [facility]) CM/SW Contact:    Pollie Friar, RN Phone Number: 05/01/2019, 11:57 AM  Clinical Narrative:                 Pt denies issues with obtaining or taking home meds.  Pt either has a friend she pays for transportation or she takes a cab.   Expected Discharge Plan: IP Rehab Facility Barriers to Discharge: Continued Medical Work up   Patient Goals and CMS Choice Patient states their goals for this hospitalization and ongoing recovery are:: to get back home      Expected Discharge Plan and Services Expected Discharge Plan: [place]   Discharge Planning Services: CM Consult   Living arrangements for the past 2 months: Single Family Home(one level)                                      Prior Living Arrangements/Services Living arrangements for the past 2 months: Single Family Home(one level) Lives with:: Adult Children(son) Patient language and need for interpreter reviewed:: Yes(no needs) Do you feel safe going back to the place where you live?: Yes      Need for Family Participation in Patient Care: Yes (Comment)(24 hour supervision) Care giver support system in place?: Yes (comment)(Pt states son can provide needed supervision and assistance) Current home services: DME(shower seat, 3 in 1, walker, rollator, home oxygen at 2.5 L through [employer]) Criminal Activity/Legal Involvement Pertinent to Current Situation/Hospitalization: No - Comment as needed  Activities of Daily Living Home Assistive Devices/Equipment: Wheelchair ADL Screening (condition at time of admission) Patient's cognitive ability adequate to safely complete daily activities?: No Is the patient deaf or have difficulty hearing?: No Does the patient have difficulty seeing, even when wearing glasses/contacts?: No Does the patient have  difficulty concentrating, remembering, or making decisions?: Yes Patient able to express need for assistance with ADLs?: No Does the patient have difficulty dressing or bathing?: Yes Independently performs ADLs?: No Communication: Independent with device (comment) Dressing (OT): Dependent Is this a change from baseline?: Pre-admission baseline Grooming: Dependent Is this a change from baseline?: Pre-admission baseline Feeding: Dependent Is this a change from baseline?: Pre-admission baseline Bathing: Dependent Is this a change from baseline?: Pre-admission baseline Toileting: Dependent, Needs assistance Is this a change from baseline?: Pre-admission baseline In/Out Bed: Dependent Is this a change from baseline?: Pre-admission baseline Walks in Home: Dependent Is this a change from baseline?: Pre-admission baseline Does the patient have difficulty walking or climbing stairs?: Yes Weakness of Legs: Left Weakness of Arms/Hands: Left  Permission Sought/Granted                  Emotional Assessment Appearance:: Appears stated age Attitude/Demeanor/Rapport: Engaged Affect (typically observed): Accepting, Appropriate, Pleasant Orientation: : Oriented to Self, Oriented to Place, Oriented to Situation, Oriented to  Time   Psych Involvement: No (comment)  Admission diagnosis:  Hypokalemia [E87.6] Renal insufficiency [N28.9] Thrombocytosis (HCC) [D47.3] Normochromic normocytic anemia [D64.9] Cerebrovascular accident (CVA), unspecified mechanism ([place]) [I63.9] Patient Active Problem List   Diagnosis Date Noted  . Stroke ([place]) 05/01/2019  . Acute renal failure superimposed on stage 3 chronic kidney disease ([place]) 05/01/2019  . Acute ischemic stroke ([place]) 05/01/2019  . AKI (acute kidney injury) ([place]) 08/29/2018  . Encephalopathy  acute 08/22/2018  . Chronic back pain 08/22/2018  . Vitamin B12 deficiency 08/19/2018  . Dehydration   . Tachycardia   . Altered mental state  08/18/2018  . Closed compression fracture of L1 vertebra (HCC)   . Acute cystitis without hematuria   . Pain management   . Intractable pain   . Back pain 10/10/2016  . L2 vertebral fracture ([place]) 10/10/2016  . Acute encephalopathy 12/25/2015  . Drug-induced delirium ([place]) 12/25/2015  . Acute on chronic respiratory failure ([place]) 12/25/2015  . CAP (community acquired pneumonia) 12/19/2015  . Chronic respiratory failure ([place]) 03/19/2014  . Depression 03/19/2014  . Aspiration pneumonia ([place]) 03/10/2014  . Acute respiratory failure with hypoxia ([place]) 03/10/2014  . Sepsis ([place]) 03/10/2014  . Hypokalemia 03/10/2014  . Hypertension 03/10/2014  . COPD (chronic obstructive pulmonary disease) ([place]) 03/10/2014  . Tobacco abuse 03/10/2014  . Normocytic anemia 06/15/2013  . Generalized abdominal pain 06/15/2013  . Blood in the stool 06/15/2013   PCP:  [name], MD Pharmacy:   [facility] [place], [address] [place] [address] Phone: [phone] Fax: [phone]     Social Determinants of Health ([place]) Interventions    Readmission Risk Interventions Readmission Risk Prevention Plan 08/22/2018  Post Dischage Appt Patient refused  Medication Screening Complete  Transportation Screening Complete  PCP follow-up Patient refused  Some recent data might be hidden

## 2019-05-01 NOTE — ED Provider Notes (Signed)
[facility] EMERGENCY DEPARTMENT Provider Note   CSN: [id number] Arrival date & time: 05/01/19  0150  An emergency department physician performed an initial assessment on this suspected stroke patient at 0151.  History   Chief Complaint Stroke  HPI ANGLES TREVIZO is a 83 y.o. female.   The history is provided by the patient and the EMS personnel.  She has history of hypertension, COPD and comes in with left-sided weakness.  She was last seen normal at 5PM.  At that time, her son noted that her speech was slurred.  She is not complaining of any headache.  Past Medical History:  Diagnosis Date   Chronic back pain    Chronic back pain 08/22/2018   Colitis 03/19/2014   COPD (chronic obstructive pulmonary disease) ([place])    Hypertension    On home oxygen therapy 06-14-13   continuos 2.5l/m nasally-24/7   Osteoporosis    Oxygen dependent     Patient Active Problem List   Diagnosis Date Noted   AKI (acute kidney injury) ([place]) 08/29/2018   Encephalopathy acute 08/22/2018   Chronic back pain 08/22/2018   Vitamin B12 deficiency 08/19/2018   Dehydration    Tachycardia    Altered mental state 08/18/2018   Closed compression fracture of L1 vertebra (HCC)    Acute cystitis without hematuria    Pain management    Intractable pain    Back pain 10/10/2016   L2 vertebral fracture ([place]) 10/10/2016   Acute encephalopathy 12/25/2015   Drug-induced delirium ([place]) 12/25/2015   Acute on chronic respiratory failure ([place]) 12/25/2015   CAP (community acquired pneumonia) 12/19/2015   Chronic respiratory failure ([place]) 03/19/2014   Depression 03/19/2014   Aspiration pneumonia ([place]) 03/10/2014   Acute respiratory failure with hypoxia ([place]) 03/10/2014   Sepsis ([place]) 03/10/2014   Hypokalemia 03/10/2014   Hypertension 03/10/2014   COPD (chronic obstructive pulmonary disease) ([place]) 03/10/2014   Tobacco abuse 03/10/2014   Anemia 06/15/2013    Generalized abdominal pain 06/15/2013   Blood in the stool 06/15/2013    Past Surgical History:  Procedure Laterality Date   ABDOMINAL HYSTERECTOMY     ESOPHAGOGASTRODUODENOSCOPY N/A 06/15/2013   Procedure: ESOPHAGOGASTRODUODENOSCOPY (EGD);  Surgeon: [name], MD;  Location: [facility];  Service: Endoscopy;  Laterality: N/A;   IR GENERIC HISTORICAL  10/15/2016   IR KYPHO LUMBAR INC FX REDUCE BONE BX UNI/BIL CANNULATION INC/IMAGING 10/15/2016 Luanne Bras, MD [facility]   sacroplasty  06-14-13   05-10-13 IVR [facility]E for fracture stabilization     OB History   No obstetric history on file.      Home Medications    Prior to Admission medications   Medication Sig Start Date End Date Taking? Authorizing Provider  acetaminophen (TYLENOL) 500 MG tablet Take 1 tablet (500 mg total) by mouth 3 (three) times daily. 08/22/18   Eugenie Filler, MD  albuterol (PROVENTIL HFA;VENTOLIN HFA) 108 (90 Base) MCG/ACT inhaler Inhale 1-2 puffs into the lungs every 4 (four) hours as needed for wheezing or shortness of breath. 06/09/18   Duffy Bruce, MD  amLODipine (NORVASC) 5 MG tablet Take 1 tablet (5 mg total) by mouth daily. 08/22/18   Eugenie Filler, MD  docusate sodium (COLACE) 250 MG capsule Take 1 capsule (250 mg total) by mouth daily. 06/09/18   Duffy Bruce, MD  DULoxetine (CYMBALTA) 30 MG capsule Take 30-60 capsules by mouth See admin instructions. 1 capsule daily for 1 week, increase to 2 capsules therafter 07/12/18  [provider]  feeding supplement, ENSURE ENLIVE, (ENSURE ENLIVE) LIQD Take 237 mLs by mouth 2 (two) times daily between meals. 09/03/18   Debbe Odea, MD  gabapentin (NEURONTIN) 100 MG capsule Take 100-300 mg by mouth at bedtime as needed for pain. 07/12/18   [provider]  lidocaine (LIDODERM) 5 % Place 1 patch onto the skin daily. Remove & Discard patch within 12 hours or as directed by MD 08/23/18   Eugenie Filler, MD   oxyCODONE (OXY IR/ROXICODONE) 5 MG immediate release tablet Take 1 tablet (5 mg total) by mouth every 6 (six) hours as needed for breakthrough pain. 09/03/18   Debbe Odea, MD  polyethylene glycol (MIRALAX / GLYCOLAX) packet Take 17 g by mouth daily as needed for mild constipation. Patient not taking: Reported on 08/18/2018 10/16/16   Bonnielee Haff, MD  senna (SENOKOT) 8.6 MG TABS tablet Take 1 tablet (8.6 mg total) by mouth 2 (two) times daily. 08/22/18   Eugenie Filler, MD  Syringe/Needle, Disp, (SYRINGE 3CC/22GX3/4") 22G X 3/4" 3 ML MISC 1,000 mcg by Does not apply route daily. 08/22/18   Eugenie Filler, MD  vitamin B-12 1000 MCG tablet Take 1 tablet (1,000 mcg total) by mouth daily. 09/04/18   Debbe Odea, MD    Family History No family history on file.  Social History Social History   Tobacco Use   Smoking status: Former Smoker    Types: Cigarettes    Last attempt to quit: 06/15/2011    Years since quitting: 7.8   Smokeless tobacco: Never Used  Substance Use Topics   Alcohol use: No   Drug use: No     Allergies   Ambien [zolpidem]   Review of Systems Review of Systems  All other systems reviewed and are negative.    Physical Exam Updated Vital Signs BP (!) 114/47 (BP Location: Right Arm)    Pulse 87    Temp 97.6 F (36.4 C) (Oral)    Resp 18    SpO2 99%   Physical Exam Vitals signs and nursing note reviewed.    83 year old female, resting comfortably and in no acute distress. Vital signs are normal. Oxygen saturation is 99%, which is normal. Head is normocephalic and atraumatic. PERRLA, EOMI. Oropharynx is clear. Neck is nontender and supple without adenopathy or JVD. Back is nontender and there is no CVA tenderness. Lungs are clear without rales, wheezes, or rhonchi. Chest is nontender. Heart has regular rate and rhythm without murmur. Abdomen is soft, flat, nontender without masses or hepatosplenomegaly and peristalsis is  normoactive. Extremities have no cyanosis or edema, full range of motion is present. Skin is warm and dry without rash. Neurologic: Awake and alert but with markedly dysarthric speech.  Left central facial droop is present.  There is a dense left hemiparesis.  Left Babinski response is present.  ED Treatments / Results  Labs (all labs ordered are listed, but only abnormal results are displayed) Labs Reviewed  CBC - Abnormal; Notable for the following components:      Result Value   RBC 3.20 (*)    Hemoglobin 8.4 (*)    HCT 28.5 (*)    MCHC 29.5 (*)    Platelets 499 (*)    All other components within normal limits  I-STAT CREATININE, ED - Abnormal; Notable for the following components:   Creatinine, Ser 1.30 (*)    All other components within normal limits  PROTIME-INR  APTT  DIFFERENTIAL  COMPREHENSIVE METABOLIC  PANEL  CBG MONITORING, ED    EKG None  Radiology Ct Angio Head W Or Wo Contrast  Result Date: 05/01/2019 CLINICAL DATA:  83 y/o  F; left-sided weakness and slurred speech. EXAM: CT ANGIOGRAPHY HEAD AND NECK CT PERFUSION BRAIN TECHNIQUE: Multidetector CT imaging of the head and neck was performed using the standard protocol during bolus administration of intravenous contrast. Multiplanar CT image reconstructions and MIPs were obtained to evaluate the vascular anatomy. Carotid stenosis measurements (when applicable) are obtained utilizing NASCET criteria, using the distal internal carotid diameter as the denominator. Multiphase CT imaging of the brain was performed following IV bolus contrast injection. Subsequent parametric perfusion maps were calculated using RAPID software. CONTRAST:  15mL OMNIPAQUE IOHEXOL 350 MG/ML SOLN COMPARISON:  05/01/2019 CT head FINDINGS: CTA NECK FINDINGS Aortic arch: Left vertebral artery arises from the aorta. Imaged portion shows no evidence of aneurysm or dissection. No significant stenosis of the major arch vessel origins. Aortic calcific  atherosclerosis. Right carotid system: No evidence of dissection, stenosis (50% or greater) or occlusion. Left carotid system: Mixed plaque of left carotid bifurcation with moderate 50-70% proximal ICA stenosis. Vertebral arteries: Codominant. No evidence of dissection, stenosis (50% or greater) or occlusion. Mild stenosis of proximal right V2. calcified plaque of left vertebral artery origin with mild stenosis. Skeleton: No acute finding. Other neck: Negative. Upper chest: Centrilobular emphysema. Review of the MIP images confirms the above findings CTA HEAD FINDINGS Anterior circulation: Calcific atherosclerosis of the carotid siphons with moderate right and mild left paraclinoid ICA stenosis. Moderate right A2 and mild left A2 stenosis. Otherwise no significant stenosis, proximal occlusion, aneurysm, or vascular malformation. Posterior circulation: Moderate right P1 and mild left P1 stenosis. Upper basilar mild stenosis. Otherwise no significant stenosis, proximal occlusion, aneurysm, or vascular malformation. Venous sinuses: As permitted by contrast timing, patent. Anatomic variants: None significant. Review of the MIP images confirms the above findings CT Brain Perfusion Findings: ASPECTS: 10 CBF (<30%) Volume: 78mL Perfusion (Tmax>6.0s) volume: 70mL Mismatch Volume: 36mL Infarction Location:Not applicable. IMPRESSION: CT brain perfusion: 1. No acute stroke or significant oligemia by standard perfusion criteria. ASPECTS is 10. CTA head: 1. No large vessel occlusion, aneurysm, or vascular malformation identified. 2. Extensive intracranial atherosclerosis with multiple areas of mild-to-moderate stenosis in the anterior and posterior circulations. CTA neck: 1. Patent carotid and vertebral arteries. No dissection, occlusion, or aneurysm. 2. Left proximal ICA moderate 50-70% stenosis with mixed plaque. 3. Segments of mild stenosis of proximal bilateral vertebral arteries. 4. Aortic Atherosclerosis (ICD10-I70.0) and  Emphysema (ICD10-J43.9). These results were called by telephone at the time of interpretation on 05/01/2019 at 2:37 am to Dr. Roland Rack , who verbally acknowledged these results. Electronically Signed   By: Kristine Garbe M.D.   On: 05/01/2019 02:41   Ct Angio Neck W Or Wo Contrast  Result Date: 05/01/2019 CLINICAL DATA:  83 y/o  F; left-sided weakness and slurred speech. EXAM: CT ANGIOGRAPHY HEAD AND NECK CT PERFUSION BRAIN TECHNIQUE: Multidetector CT imaging of the head and neck was performed using the standard protocol during bolus administration of intravenous contrast. Multiplanar CT image reconstructions and MIPs were obtained to evaluate the vascular anatomy. Carotid stenosis measurements (when applicable) are obtained utilizing NASCET criteria, using the distal internal carotid diameter as the denominator. Multiphase CT imaging of the brain was performed following IV bolus contrast injection. Subsequent parametric perfusion maps were calculated using RAPID software. CONTRAST:  137mL OMNIPAQUE IOHEXOL 350 MG/ML SOLN COMPARISON:  05/01/2019 CT head FINDINGS: CTA NECK FINDINGS  Aortic arch: Left vertebral artery arises from the aorta. Imaged portion shows no evidence of aneurysm or dissection. No significant stenosis of the major arch vessel origins. Aortic calcific atherosclerosis. Right carotid system: No evidence of dissection, stenosis (50% or greater) or occlusion. Left carotid system: Mixed plaque of left carotid bifurcation with moderate 50-70% proximal ICA stenosis. Vertebral arteries: Codominant. No evidence of dissection, stenosis (50% or greater) or occlusion. Mild stenosis of proximal right V2. calcified plaque of left vertebral artery origin with mild stenosis. Skeleton: No acute finding. Other neck: Negative. Upper chest: Centrilobular emphysema. Review of the MIP images confirms the above findings CTA HEAD FINDINGS Anterior circulation: Calcific atherosclerosis of the carotid  siphons with moderate right and mild left paraclinoid ICA stenosis. Moderate right A2 and mild left A2 stenosis. Otherwise no significant stenosis, proximal occlusion, aneurysm, or vascular malformation. Posterior circulation: Moderate right P1 and mild left P1 stenosis. Upper basilar mild stenosis. Otherwise no significant stenosis, proximal occlusion, aneurysm, or vascular malformation. Venous sinuses: As permitted by contrast timing, patent. Anatomic variants: None significant. Review of the MIP images confirms the above findings CT Brain Perfusion Findings: ASPECTS: 10 CBF (<30%) Volume: 33mL Perfusion (Tmax>6.0s) volume: 15mL Mismatch Volume: 34mL Infarction Location:Not applicable. IMPRESSION: CT brain perfusion: 1. No acute stroke or significant oligemia by standard perfusion criteria. ASPECTS is 10. CTA head: 1. No large vessel occlusion, aneurysm, or vascular malformation identified. 2. Extensive intracranial atherosclerosis with multiple areas of mild-to-moderate stenosis in the anterior and posterior circulations. CTA neck: 1. Patent carotid and vertebral arteries. No dissection, occlusion, or aneurysm. 2. Left proximal ICA moderate 50-70% stenosis with mixed plaque. 3. Segments of mild stenosis of proximal bilateral vertebral arteries. 4. Aortic Atherosclerosis (ICD10-I70.0) and Emphysema (ICD10-J43.9). These results were called by telephone at the time of interpretation on 05/01/2019 at 2:37 am to Dr. Roland Rack , who verbally acknowledged these results. Electronically Signed   By: Kristine Garbe M.D.   On: 05/01/2019 02:41   Ct Cerebral Perfusion W Contrast  Result Date: 05/01/2019 CLINICAL DATA:  83 y/o  F; left-sided weakness and slurred speech. EXAM: CT ANGIOGRAPHY HEAD AND NECK CT PERFUSION BRAIN TECHNIQUE: Multidetector CT imaging of the head and neck was performed using the standard protocol during bolus administration of intravenous contrast. Multiplanar CT image reconstructions  and MIPs were obtained to evaluate the vascular anatomy. Carotid stenosis measurements (when applicable) are obtained utilizing NASCET criteria, using the distal internal carotid diameter as the denominator. Multiphase CT imaging of the brain was performed following IV bolus contrast injection. Subsequent parametric perfusion maps were calculated using RAPID software. CONTRAST:  170mL OMNIPAQUE IOHEXOL 350 MG/ML SOLN COMPARISON:  05/01/2019 CT head FINDINGS: CTA NECK FINDINGS Aortic arch: Left vertebral artery arises from the aorta. Imaged portion shows no evidence of aneurysm or dissection. No significant stenosis of the major arch vessel origins. Aortic calcific atherosclerosis. Right carotid system: No evidence of dissection, stenosis (50% or greater) or occlusion. Left carotid system: Mixed plaque of left carotid bifurcation with moderate 50-70% proximal ICA stenosis. Vertebral arteries: Codominant. No evidence of dissection, stenosis (50% or greater) or occlusion. Mild stenosis of proximal right V2. calcified plaque of left vertebral artery origin with mild stenosis. Skeleton: No acute finding. Other neck: Negative. Upper chest: Centrilobular emphysema. Review of the MIP images confirms the above findings CTA HEAD FINDINGS Anterior circulation: Calcific atherosclerosis of the carotid siphons with moderate right and mild left paraclinoid ICA stenosis. Moderate right A2 and mild left A2 stenosis. Otherwise no significant  stenosis, proximal occlusion, aneurysm, or vascular malformation. Posterior circulation: Moderate right P1 and mild left P1 stenosis. Upper basilar mild stenosis. Otherwise no significant stenosis, proximal occlusion, aneurysm, or vascular malformation. Venous sinuses: As permitted by contrast timing, patent. Anatomic variants: None significant. Review of the MIP images confirms the above findings CT Brain Perfusion Findings: ASPECTS: 10 CBF (<30%) Volume: 63mL Perfusion (Tmax>6.0s) volume: 14mL  Mismatch Volume: 45mL Infarction Location:Not applicable. IMPRESSION: CT brain perfusion: 1. No acute stroke or significant oligemia by standard perfusion criteria. ASPECTS is 10. CTA head: 1. No large vessel occlusion, aneurysm, or vascular malformation identified. 2. Extensive intracranial atherosclerosis with multiple areas of mild-to-moderate stenosis in the anterior and posterior circulations. CTA neck: 1. Patent carotid and vertebral arteries. No dissection, occlusion, or aneurysm. 2. Left proximal ICA moderate 50-70% stenosis with mixed plaque. 3. Segments of mild stenosis of proximal bilateral vertebral arteries. 4. Aortic Atherosclerosis (ICD10-I70.0) and Emphysema (ICD10-J43.9). These results were called by telephone at the time of interpretation on 05/01/2019 at 2:37 am to Dr. Roland Rack , who verbally acknowledged these results. Electronically Signed   By: Kristine Garbe M.D.   On: 05/01/2019 02:41   Ct Head Code Stroke Wo Contrast  Result Date: 05/01/2019 CLINICAL DATA:  Code stroke. 83 y/o F; left-sided weakness and slurred speech. EXAM: CT HEAD WITHOUT CONTRAST TECHNIQUE: Contiguous axial images were obtained from the base of the skull through the vertex without intravenous contrast. COMPARISON:  08/29/2018 CT head. FINDINGS: Brain: Interval small age-indeterminate infarcts are present within the right caudate head and left cerebellar hemisphere. Stable small chronic infarct within the left anterior putamen. No large acute vascular territory infarction, hemorrhage, mass effect, extra-axial collection, hydrocephalus, or herniation. Stable nonspecific white matter hypodensities compatible with chronic microvascular ischemic changes and stable volume loss of the brain. Vascular: Calcific atherosclerosis of carotid siphons and vertebral arteries. No hyperdense vessel identified. Skull: Normal. Negative for fracture or focal lesion. Sinuses/Orbits: Mucosal thickening of the right  maxillary sinus and right anterior ethmoid air cells. Chronic inflammatory changes of the walls of the right maxillary sinus. Opacification of bilateral mastoid air cells. Orbits are unremarkable. Other: None. ASPECTS [facility] Stroke Program Early CT Score) - Ganglionic level infarction (caudate, lentiform nuclei, internal capsule, insula, M1-M3 cortex): 7 - Supraganglionic infarction (M4-M6 cortex): 3 Total score (0-10 with 10 being normal): 10 IMPRESSION: 1. Interval small age-indeterminate infarcts within the right caudate head and left cerebellar hemisphere. 2. Otherwise no large acute stroke, hemorrhage, or mass effect identified. ASPECTS is 10. 3. Stable chronic microvascular ischemic changes and parenchymal volume loss of the brain. These results were called by telephone at the time of interpretation on 05/01/2019 at 2:06 am to Dr. Leonel Ramsay, who verbally acknowledged these results. Electronically Signed   By: Kristine Garbe M.D.   On: 05/01/2019 02:06    Procedures Procedures  CRITICAL CARE Performed by: Delora Fuel Total critical care time: 60 minutes Critical care time was exclusive of separately billable procedures and treating other patients. Critical care was necessary to treat or prevent imminent or life-threatening deterioration. Critical care was time spent personally by me on the following activities: development of treatment plan with patient and/or surrogate as well as nursing, discussions with consultants, evaluation of patient's response to treatment, examination of patient, obtaining history from patient or surrogate, ordering and performing treatments and interventions, ordering and review of laboratory studies, ordering and review of radiographic studies, pulse oximetry and re-evaluation of patient's condition.  Medications Ordered in ED Medications  sodium  chloride flush (NS) 0.9 % injection 3 mL (has no administration in time range)  potassium chloride SA (K-DUR) CR  tablet 40 mEq (has no administration in time range)  iohexol (OMNIPAQUE) 350 MG/ML injection 115 mL (115 mLs Intravenous Contrast Given 05/01/19 0206)     Initial Impression / Assessment and Plan / ED Course  I have reviewed the triage vital signs and the nursing notes.  Pertinent labs & imaging results that were available during my care of the patient were reviewed by me and considered in my medical decision making (see chart for details).  Right hemisphere stroke with dense left hemiparesis.  She is outside the window for thrombolytic therapy and is sent for emergent CT and CT angiogram to look for possible large vessel occlusion that would be amenable to endovascular treatment.  CT shows no bleed, age-indeterminate infarcts in the right caudate head and left cerebellar hemisphere.  CT angiogram shows no large vessel occlusion.  She will need to be admitted to the general medical service.  Case is discussed with Dr. Blaine Hamper of [facility], who agrees to admit the patient.  Final Clinical Impressions(s) / ED Diagnoses   Final diagnoses:  Cerebrovascular accident (CVA), unspecified mechanism ([place])  Normochromic normocytic anemia  Thrombocytosis ([place])  Renal insufficiency  Hypokalemia    ED Discharge Orders    None       Delora Fuel, MD 31/49/70 0302

## 2019-05-01 NOTE — Evaluation (Signed)
Physical Therapy Evaluation Patient Details Name: [name] MRN: [id number] DOB: 03-23-1936 Today's Date: 05/01/2019   History of Present Illness  Pt is an 83 y/o female who presents with L sided weakness and slurred speech. She was found to have an acute R pontine infarct. PMH significant for COPD, osteoporosis, chronic back pain.  Clinical Impression  Pt admitted with above diagnosis. Pt currently with functional limitations due to the deficits listed below (see PT Problem List). At the time of PT eval pt was able to perform transfers with +2 assist for balance support and safety. Pt with significant weakness in L UE/LE, and is not able to ambulate at this time. 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 It appears that pt's son will be able to assist pt at d/c, and recommending continued therapy at the CIR level to maximize functional independence and safety prior to return home.      Follow Up Recommendations CIR;Supervision/Assistance - 24 hour    Equipment Recommendations  Other (comment)(TBD by next venue of care)    Recommendations for Other Services Rehab consult     Precautions / Restrictions Precautions Precautions: Fall Restrictions Weight Bearing Restrictions: No      Mobility  Bed Mobility Overal bed mobility: Needs Assistance Bed Mobility: Supine to Sit     Supine to sit: Max assist;+2 for physical assistance     General bed mobility comments: Assist for LE advancement towards EOB, scooting and trunk elevation to full sitting position. Bed pad utilized for scooting assist.   Transfers Overall transfer level: Needs assistance Equipment used: 2 person hand held assist Transfers: Stand Pivot Transfers;Sit to/from Stand Sit to Stand: Max assist;+2 physical assistance Stand pivot transfers: Max assist;+2 physical assistance       General transfer comment: Pt required increased assist to power-up to full stand  and maintain standing balance. Stand pivot utilized with bed pad under hips to transition to chair. Lift pad placed under pt for nursing staff return to bed later.  Ambulation/Gait             General Gait Details: Unable  Stairs            Wheelchair Mobility    Modified Rankin (Stroke Patients Only)       Balance Overall balance assessment: Needs assistance Sitting-balance support: Feet supported;No upper extremity supported Sitting balance-Leahy Scale: Poor Sitting balance - Comments: Required hands-on assist for sitting balance throughout EOB activity.  Postural control: Posterior lean Standing balance support: Bilateral upper extremity supported;During functional activity Standing balance-Leahy Scale: Zero Standing balance comment: +2 required.                              Pertinent Vitals/Pain Pain Assessment: Faces Faces Pain Scale: Hurts even more Pain Location: Back (chronic) Pain Descriptors / Indicators: Discomfort;Grimacing;Aching Pain Intervention(s): Limited activity within patient's tolerance;Monitored during session;Repositioned    Home Living Family/patient expects to be discharged to:: Private residence Living Arrangements: Children(son) Available Help at Discharge: Available 24 hours/day Type of Home: House Home Access: Stairs to enter Entrance Stairs-Rails: Right Entrance Stairs-Number of Steps: 2 Home Layout: One level Home Equipment: Walker - 2 wheels;Cane - single point;Bedside commode;Walker - 4 wheels Additional Comments: Some information taken from prior PT/OT notes from previous admission in September 2019.     Prior Function Level of Independence: Needs assistance  Gait / Transfers Assistance Needed: Pt using a rollator for ambulation  ADL's / Homemaking Assistance Needed: Pt reports she was independent with BADL's but son assisted with IADL's.         Hand Dominance        Extremity/Trunk Assessment    Upper Extremity Assessment Upper Extremity Assessment: Defer to OT evaluation    Lower Extremity Assessment Lower Extremity Assessment: LLE deficits/detail LLE Deficits / Details: Minimal active movement on eval    Cervical / Trunk Assessment Cervical / Trunk Assessment: Kyphotic  Communication   Communication: Expressive difficulties  Cognition Arousal/Alertness: Awake/alert Behavior During Therapy: Anxious(emotional at times) Overall Cognitive Status: Impaired/Different from baseline Area of Impairment: Orientation;Memory;Attention;Following commands;Safety/judgement;Awareness;Problem solving                 Orientation Level: Disoriented to;Situation Current Attention Level: Sustained Memory: Decreased short-term memory Following Commands: Follows one step commands inconsistently;Follows one step commands with increased time Safety/Judgement: Decreased awareness of safety;Decreased awareness of deficits Awareness: Intellectual Problem Solving: Slow processing;Decreased initiation;Requires verbal cues;Requires tactile cues;Difficulty sequencing General Comments: Asked multiple times "What's wrong with me?" and required redirection to situation. Poor awareness of safety, thinking she could walk after it took 2 people to transfer to the chair only,      General Comments      Exercises     Assessment/Plan    PT Assessment Patient needs continued PT services  PT Problem List Decreased strength;Decreased range of motion;Decreased activity tolerance;Decreased balance;Decreased mobility;Decreased knowledge of use of DME;Decreased safety awareness;Decreased knowledge of precautions;Decreased coordination;Decreased cognition;Pain       PT Treatment Interventions DME instruction;Gait training;Functional mobility training;Stair training;Therapeutic activities;Therapeutic exercise;Neuromuscular re-education;Patient/family education;Cognitive remediation;Balance training    PT  Goals (Current goals can be found in the Care Plan section)  Acute Rehab PT Goals Patient Stated Goal: Not go to a nursing home PT Goal Formulation: With patient Time For Goal Achievement: 05/15/19 Potential to Achieve Goals: Good    Frequency Min 4X/week   Barriers to discharge        Co-evaluation               AM-PAC PT "6 Clicks" Mobility  Outcome Measure Help needed turning from your back to your side while in a flat bed without using bedrails?: A Lot Help needed moving from lying on your back to sitting on the side of a flat bed without using bedrails?: A Lot Help needed moving to and from a bed to a chair (including a wheelchair)?: A Lot Help needed standing up from a chair using your arms (e.g., wheelchair or bedside chair)?: A Lot Help needed to walk in hospital room?: Total Help needed climbing 3-5 steps with a railing? : Total 6 Click Score: 10    End of Session Equipment Utilized During Treatment: Gait belt Activity Tolerance: Patient tolerated treatment well Patient left: in chair;with call bell/phone within reach;with chair alarm set Nurse Communication: Mobility status PT Visit Diagnosis: Unsteadiness on feet (R26.81);Hemiplegia and hemiparesis;Pain Hemiplegia - Right/Left: Left Hemiplegia - dominant/non-dominant: Dominant Hemiplegia - caused by: Cerebral infarction Pain - part of body: (back)    Time:  10:11-10:50; 39 minutes   Charges:   PT Evaluation $PT Eval Moderate Complexity: 1 Mod          Rolinda Roan, PT, DPT Acute Rehabilitation Services Pager: [phone] Office: [place] 05/01/2019, 1:31 PM

## 2019-05-01 NOTE — Progress Notes (Signed)
  Echocardiogram 2D Echocardiogram has been performed.  [name] 05/01/2019, 9:40 AM

## 2019-05-01 NOTE — H&P (Signed)
History and Physical    [name] KDT:[id number] DOB: [birth year] DOA: 05/01/2019  Referring MD/NP/PA:   PCP: [name], MD   Patient coming from:  The patient is coming from home.  At baseline, pt is partially dependent for most of ADL.        Chief Complaint: Left-sided weakness, slurred speech  HPI: [name] is a 83 y.o. female with medical history significant of hypertension, COPD on 2.5 L oxygen at home, depression, chronic back pain, anemia, CKD 3, who presents with left-sided weakness and  slurred speech.   Per report, pt was noted to have left-sided weakness, and slurred speech at about 5 PM. She normally can walk by herself, but now she is not able to. No vision loss or hearing loss.  Patient has mild cough, no worsening shortness of breath.  Denies chest pain, fever or chills.  No nausea, vomiting, diarrhea, abdominal pain, symptoms of UTI. She states that has chronic left knee joint pain.  ED Course: pt was found to have WBC 10.1, potassium 3.4, worsening renal function, temperature normal, no tachycardia, no tachypnea, oxygen saturation 99% on 2.5 L nasal cannula oxygen.  CT-head showed interval small age-indeterminate infarcts within the right caudate head and left cerebellar hemisphere. CTA of head and neck and cerebral perfusion revealed no LVO or acute stroke, but showed left proximal ICA moderate 50-70% stenosis with mixed plaque.  Patient is placed on telemetry bed for observation. Neurology, Dr. Deneise Lever was consulted.   Review of Systems:   General: no fevers, chills, no body weight gain, has fatigue HEENT: no blurry vision, hearing changes or sore throat Respiratory: no dyspnea, has mild coughing, no wheezing CV: no chest pain, no palpitations GI: no nausea, vomiting, abdominal pain, diarrhea, constipation GU: no dysuria, burning on urination, increased urinary frequency, hematuria  Ext: no leg edema Neuro: No vision change or hearing loss. Has  left sided weakness and slurry speech. Skin: no rash, no skin tear. MSK: has left knee pain. Heme: No easy bruising.  Travel history: No recent long distant travel.  Allergy:  Allergies  Allergen Reactions   Ambien [Zolpidem] Other (See Comments)    Hallucinations and paranoid    Past Medical History:  Diagnosis Date   Chronic back pain    Chronic back pain 08/22/2018   Colitis 03/19/2014   COPD (chronic obstructive pulmonary disease) ([place])    Hypertension    On home oxygen therapy 06-14-13   continuos 2.5l/m nasally-24/7   Osteoporosis    Oxygen dependent     Past Surgical History:  Procedure Laterality Date   ABDOMINAL HYSTERECTOMY     ESOPHAGOGASTRODUODENOSCOPY N/A 06/15/2013   Procedure: ESOPHAGOGASTRODUODENOSCOPY (EGD);  Surgeon: [name], MD;  Location: [facility];  Service: Endoscopy;  Laterality: N/A;   IR GENERIC HISTORICAL  10/15/2016   IR KYPHO LUMBAR INC FX REDUCE BONE BX UNI/BIL CANNULATION INC/IMAGING 10/15/2016 Luanne Bras, MD [facility]   sacroplasty  06-14-13   05-10-13 IVR [facility] for fracture stabilization    Social History:  reports that she quit smoking about 7 years ago. Her smoking use included cigarettes. She has never used smokeless tobacco. She reports that she does not drink alcohol or use drugs.  Family History:  Family History  Problem Relation Age of Onset   Hypertension Sister    Diabetes Mellitus II Sister      Prior to Admission medications   Medication Sig Start Date End Date Taking? Authorizing Provider  acetaminophen (  TYLENOL) 500 MG tablet Take 1 tablet (500 mg total) by mouth 3 (three) times daily. 08/22/18   Eugenie Filler, MD  albuterol (PROVENTIL HFA;VENTOLIN HFA) 108 (90 Base) MCG/ACT inhaler Inhale 1-2 puffs into the lungs every 4 (four) hours as needed for wheezing or shortness of breath. 06/09/18   Duffy Bruce, MD  amLODipine (NORVASC) 5 MG tablet Take 1 tablet (5 mg total) by mouth daily.  08/22/18   Eugenie Filler, MD  docusate sodium (COLACE) 250 MG capsule Take 1 capsule (250 mg total) by mouth daily. 06/09/18   Duffy Bruce, MD  DULoxetine (CYMBALTA) 30 MG capsule Take 30-60 capsules by mouth See admin instructions. 1 capsule daily for 1 week, increase to 2 capsules therafter 07/12/18   [provider]  feeding supplement, ENSURE ENLIVE, (ENSURE ENLIVE) LIQD Take 237 mLs by mouth 2 (two) times daily between meals. 09/03/18   Debbe Odea, MD  gabapentin (NEURONTIN) 100 MG capsule Take 100-300 mg by mouth at bedtime as needed for pain. 07/12/18   [provider]  lidocaine (LIDODERM) 5 % Place 1 patch onto the skin daily. Remove & Discard patch within 12 hours or as directed by MD 08/23/18   Eugenie Filler, MD  oxyCODONE (OXY IR/ROXICODONE) 5 MG immediate release tablet Take 1 tablet (5 mg total) by mouth every 6 (six) hours as needed for breakthrough pain. 09/03/18   Debbe Odea, MD  polyethylene glycol (MIRALAX / GLYCOLAX) packet Take 17 g by mouth daily as needed for mild constipation. Patient not taking: Reported on 08/18/2018 10/16/16   Bonnielee Haff, MD  senna (SENOKOT) 8.6 MG TABS tablet Take 1 tablet (8.6 mg total) by mouth 2 (two) times daily. 08/22/18   Eugenie Filler, MD  Syringe/Needle, Disp, (SYRINGE 3CC/22GX3/4") 22G X 3/4" 3 ML MISC 1,000 mcg by Does not apply route daily. 08/22/18   Eugenie Filler, MD  vitamin B-12 1000 MCG tablet Take 1 tablet (1,000 mcg total) by mouth daily. 09/04/18   Debbe Odea, MD    Physical Exam: Vitals:   05/01/19 0236 05/01/19 0245 05/01/19 0300 05/01/19 0435  BP: (!) 114/47 (!) 104/48 (!) 92/47 (!) 138/59  Pulse: 87   79  Resp: 18 18 17 18   Temp: 97.6 F (36.4 C)   98.2 F (36.8 C)  TempSrc: Oral   Oral  SpO2: 99%   100%  Weight:    60.8 kg  Height:    4\' 9"  (1.448 m)   General: Not in acute distress HEENT:       Eyes: PERRL, EOMI, no scleral icterus.       ENT: No discharge from the ears and  nose, no pharynx injection, no tonsillar enlargement.        Neck: No JVD, no bruit, no mass felt. Heme: No neck lymph node enlargement. Cardiac: S1/S2, RRR, No murmurs, No gallops or rubs. Respiratory: No rales, wheezing, rhonchi or rubs. GI: Soft, nondistended, nontender, no rebound pain, no organomegaly, BS present. GU: No hematuria Ext: No pitting leg edema bilaterally. 2+DP/PT pulse bilaterally. Musculoskeletal: No joint deformities, No joint redness or warmth. Skin: No rashes.  Neuro: Alert, oriented X3, cranial nerves II-XII grossly intact. Muscle strength 0-1/5 in left extremeties and 4/5 strength of the right arm and leg. Decreased sensation to light in the left side. Negative Babinski's sign.  Psych: Patient is not psychotic, no suicidal or hemocidal ideation.  Labs on Admission: I have personally reviewed following labs and imaging studies  CBC:  Recent Labs  Lab 05/01/19 0152  WBC 10.1  NEUTROABS 7.0  HGB 8.4*  HCT 28.5*  MCV 89.1  PLT 277*   Basic Metabolic Panel: Recent Labs  Lab 05/01/19 0152 05/01/19 0201  NA 142  --   K 3.4*  --   CL 103  --   CO2 30  --   GLUCOSE 116*  --   BUN 43*  --   CREATININE 1.39* 1.30*  CALCIUM 8.7*  --    GFR: Estimated Creatinine Clearance: 25.9 mL/min (A) (by C-G formula based on SCr of 1.3 mg/dL (H)). Liver Function Tests: Recent Labs  Lab 05/01/19 0152  AST 15  ALT 11  ALKPHOS 65  BILITOT 0.2*  PROT 6.1*  ALBUMIN 2.9*   No results for input(s): LIPASE, AMYLASE in the last 168 hours. No results for input(s): AMMONIA in the last 168 hours. Coagulation Profile: Recent Labs  Lab 05/01/19 0152  INR 1.0   Cardiac Enzymes: No results for input(s): CKTOTAL, CKMB, CKMBINDEX, TROPONINI in the last 168 hours. BNP (last 3 results) No results for input(s): PROBNP in the last 8760 hours. HbA1C: No results for input(s): HGBA1C in the last 72 hours. CBG: Recent Labs  Lab 05/01/19 0251  GLUCAP 127*   Lipid  Profile: No results for input(s): CHOL, HDL, LDLCALC, TRIG, CHOLHDL, LDLDIRECT in the last 72 hours. Thyroid Function Tests: No results for input(s): TSH, T4TOTAL, FREET4, T3FREE, THYROIDAB in the last 72 hours. Anemia Panel: No results for input(s): VITAMINB12, FOLATE, FERRITIN, TIBC, IRON, RETICCTPCT in the last 72 hours. Urine analysis:    Component Value Date/Time   COLORURINE YELLOW 08/18/2018 1820   APPEARANCEUR CLEAR 08/18/2018 1820   LABSPEC 1.019 08/18/2018 1820   PHURINE 5.0 08/18/2018 1820   GLUCOSEU NEGATIVE 08/18/2018 1820   HGBUR NEGATIVE 08/18/2018 1820   BILIRUBINUR NEGATIVE 08/18/2018 1820   KETONESUR 5 (A) 08/18/2018 1820   PROTEINUR 30 (A) 08/18/2018 1820   UROBILINOGEN 0.2 03/19/2014 1657   NITRITE NEGATIVE 08/18/2018 1820   LEUKOCYTESUR TRACE (A) 08/18/2018 1820   Sepsis Labs: @LABRCNTIP (procalcitonin:4,lacticidven:4) )No results found for this or any previous visit (from the past 240 hour(s)).   Radiological Exams on Admission: Ct Angio Head W Or Wo Contrast  Result Date: 05/01/2019 CLINICAL DATA:  83 y/o  F; left-sided weakness and slurred speech. EXAM: CT ANGIOGRAPHY HEAD AND NECK CT PERFUSION BRAIN TECHNIQUE: Multidetector CT imaging of the head and neck was performed using the standard protocol during bolus administration of intravenous contrast. Multiplanar CT image reconstructions and MIPs were obtained to evaluate the vascular anatomy. Carotid stenosis measurements (when applicable) are obtained utilizing NASCET criteria, using the distal internal carotid diameter as the denominator. Multiphase CT imaging of the brain was performed following IV bolus contrast injection. Subsequent parametric perfusion maps were calculated using RAPID software. CONTRAST:  137mL OMNIPAQUE IOHEXOL 350 MG/ML SOLN COMPARISON:  05/01/2019 CT head FINDINGS: CTA NECK FINDINGS Aortic arch: Left vertebral artery arises from the aorta. Imaged portion shows no evidence of aneurysm or  dissection. No significant stenosis of the major arch vessel origins. Aortic calcific atherosclerosis. Right carotid system: No evidence of dissection, stenosis (50% or greater) or occlusion. Left carotid system: Mixed plaque of left carotid bifurcation with moderate 50-70% proximal ICA stenosis. Vertebral arteries: Codominant. No evidence of dissection, stenosis (50% or greater) or occlusion. Mild stenosis of proximal right V2. calcified plaque of left vertebral artery origin with mild stenosis. Skeleton: No acute finding. Other neck: Negative. Upper chest: Centrilobular emphysema.  Review of the MIP images confirms the above findings CTA HEAD FINDINGS Anterior circulation: Calcific atherosclerosis of the carotid siphons with moderate right and mild left paraclinoid ICA stenosis. Moderate right A2 and mild left A2 stenosis. Otherwise no significant stenosis, proximal occlusion, aneurysm, or vascular malformation. Posterior circulation: Moderate right P1 and mild left P1 stenosis. Upper basilar mild stenosis. Otherwise no significant stenosis, proximal occlusion, aneurysm, or vascular malformation. Venous sinuses: As permitted by contrast timing, patent. Anatomic variants: None significant. Review of the MIP images confirms the above findings CT Brain Perfusion Findings: ASPECTS: 10 CBF (<30%) Volume: 38mL Perfusion (Tmax>6.0s) volume: 93mL Mismatch Volume: 66mL Infarction Location:Not applicable. IMPRESSION: CT brain perfusion: 1. No acute stroke or significant oligemia by standard perfusion criteria. ASPECTS is 10. CTA head: 1. No large vessel occlusion, aneurysm, or vascular malformation identified. 2. Extensive intracranial atherosclerosis with multiple areas of mild-to-moderate stenosis in the anterior and posterior circulations. CTA neck: 1. Patent carotid and vertebral arteries. No dissection, occlusion, or aneurysm. 2. Left proximal ICA moderate 50-70% stenosis with mixed plaque. 3. Segments of mild stenosis of  proximal bilateral vertebral arteries. 4. Aortic Atherosclerosis (ICD10-I70.0) and Emphysema (ICD10-J43.9). These results were called by telephone at the time of interpretation on 05/01/2019 at 2:37 am to Dr. Roland Rack , who verbally acknowledged these results. Electronically Signed   By: Kristine Garbe M.D.   On: 05/01/2019 02:41   Ct Angio Neck W Or Wo Contrast  Result Date: 05/01/2019 CLINICAL DATA:  83 y/o  F; left-sided weakness and slurred speech. EXAM: CT ANGIOGRAPHY HEAD AND NECK CT PERFUSION BRAIN TECHNIQUE: Multidetector CT imaging of the head and neck was performed using the standard protocol during bolus administration of intravenous contrast. Multiplanar CT image reconstructions and MIPs were obtained to evaluate the vascular anatomy. Carotid stenosis measurements (when applicable) are obtained utilizing NASCET criteria, using the distal internal carotid diameter as the denominator. Multiphase CT imaging of the brain was performed following IV bolus contrast injection. Subsequent parametric perfusion maps were calculated using RAPID software. CONTRAST:  148mL OMNIPAQUE IOHEXOL 350 MG/ML SOLN COMPARISON:  05/01/2019 CT head FINDINGS: CTA NECK FINDINGS Aortic arch: Left vertebral artery arises from the aorta. Imaged portion shows no evidence of aneurysm or dissection. No significant stenosis of the major arch vessel origins. Aortic calcific atherosclerosis. Right carotid system: No evidence of dissection, stenosis (50% or greater) or occlusion. Left carotid system: Mixed plaque of left carotid bifurcation with moderate 50-70% proximal ICA stenosis. Vertebral arteries: Codominant. No evidence of dissection, stenosis (50% or greater) or occlusion. Mild stenosis of proximal right V2. calcified plaque of left vertebral artery origin with mild stenosis. Skeleton: No acute finding. Other neck: Negative. Upper chest: Centrilobular emphysema. Review of the MIP images confirms the above  findings CTA HEAD FINDINGS Anterior circulation: Calcific atherosclerosis of the carotid siphons with moderate right and mild left paraclinoid ICA stenosis. Moderate right A2 and mild left A2 stenosis. Otherwise no significant stenosis, proximal occlusion, aneurysm, or vascular malformation. Posterior circulation: Moderate right P1 and mild left P1 stenosis. Upper basilar mild stenosis. Otherwise no significant stenosis, proximal occlusion, aneurysm, or vascular malformation. Venous sinuses: As permitted by contrast timing, patent. Anatomic variants: None significant. Review of the MIP images confirms the above findings CT Brain Perfusion Findings: ASPECTS: 10 CBF (<30%) Volume: 51mL Perfusion (Tmax>6.0s) volume: 20mL Mismatch Volume: 67mL Infarction Location:Not applicable. IMPRESSION: CT brain perfusion: 1. No acute stroke or significant oligemia by standard perfusion criteria. ASPECTS is 10. CTA head: 1. No large vessel  occlusion, aneurysm, or vascular malformation identified. 2. Extensive intracranial atherosclerosis with multiple areas of mild-to-moderate stenosis in the anterior and posterior circulations. CTA neck: 1. Patent carotid and vertebral arteries. No dissection, occlusion, or aneurysm. 2. Left proximal ICA moderate 50-70% stenosis with mixed plaque. 3. Segments of mild stenosis of proximal bilateral vertebral arteries. 4. Aortic Atherosclerosis (ICD10-I70.0) and Emphysema (ICD10-J43.9). These results were called by telephone at the time of interpretation on 05/01/2019 at 2:37 am to Dr. Roland Rack , who verbally acknowledged these results. Electronically Signed   By: Kristine Garbe M.D.   On: 05/01/2019 02:41   Mr Brain Wo Contrast  Result Date: 05/01/2019 CLINICAL DATA:  Left-sided weakness and slurred speech EXAM: MRI HEAD WITHOUT CONTRAST TECHNIQUE: Multiplanar, multiecho pulse sequences of the brain and surrounding structures were obtained without intravenous contrast. COMPARISON:   CTA head neck from earlier today FINDINGS: Brain: Moderate acute infarction in the right pons respecting the midline. No prior infarct is seen. There is minimal chronic small vessel ischemic type change in the cerebral white matter. Brain volume is normal. No acute hemorrhage, hydrocephalus, or masslike finding. Vascular: Major flow voids are preserved. CTA was performed earlier today Skull and upper cervical spine: Negative for marrow lesion. Degenerative cervical facet spurring. Sinuses/Orbits: Bilateral mastoid opacification with negative nasopharynx. Mastoid opacification was also seen on head CT 1 year prior. Other: Intermittent motion degradation. IMPRESSION: Acute right pontine infarction. Electronically Signed   By: Monte Fantasia M.D.   On: 05/01/2019 04:28   Ct Cerebral Perfusion W Contrast  Result Date: 05/01/2019 CLINICAL DATA:  83 y/o  F; left-sided weakness and slurred speech. EXAM: CT ANGIOGRAPHY HEAD AND NECK CT PERFUSION BRAIN TECHNIQUE: Multidetector CT imaging of the head and neck was performed using the standard protocol during bolus administration of intravenous contrast. Multiplanar CT image reconstructions and MIPs were obtained to evaluate the vascular anatomy. Carotid stenosis measurements (when applicable) are obtained utilizing NASCET criteria, using the distal internal carotid diameter as the denominator. Multiphase CT imaging of the brain was performed following IV bolus contrast injection. Subsequent parametric perfusion maps were calculated using RAPID software. CONTRAST:  138mL OMNIPAQUE IOHEXOL 350 MG/ML SOLN COMPARISON:  05/01/2019 CT head FINDINGS: CTA NECK FINDINGS Aortic arch: Left vertebral artery arises from the aorta. Imaged portion shows no evidence of aneurysm or dissection. No significant stenosis of the major arch vessel origins. Aortic calcific atherosclerosis. Right carotid system: No evidence of dissection, stenosis (50% or greater) or occlusion. Left carotid  system: Mixed plaque of left carotid bifurcation with moderate 50-70% proximal ICA stenosis. Vertebral arteries: Codominant. No evidence of dissection, stenosis (50% or greater) or occlusion. Mild stenosis of proximal right V2. calcified plaque of left vertebral artery origin with mild stenosis. Skeleton: No acute finding. Other neck: Negative. Upper chest: Centrilobular emphysema. Review of the MIP images confirms the above findings CTA HEAD FINDINGS Anterior circulation: Calcific atherosclerosis of the carotid siphons with moderate right and mild left paraclinoid ICA stenosis. Moderate right A2 and mild left A2 stenosis. Otherwise no significant stenosis, proximal occlusion, aneurysm, or vascular malformation. Posterior circulation: Moderate right P1 and mild left P1 stenosis. Upper basilar mild stenosis. Otherwise no significant stenosis, proximal occlusion, aneurysm, or vascular malformation. Venous sinuses: As permitted by contrast timing, patent. Anatomic variants: None significant. Review of the MIP images confirms the above findings CT Brain Perfusion Findings: ASPECTS: 10 CBF (<30%) Volume: 60mL Perfusion (Tmax>6.0s) volume: 20mL Mismatch Volume: 36mL Infarction Location:Not applicable. IMPRESSION: CT brain perfusion: 1. No acute stroke  or significant oligemia by standard perfusion criteria. ASPECTS is 10. CTA head: 1. No large vessel occlusion, aneurysm, or vascular malformation identified. 2. Extensive intracranial atherosclerosis with multiple areas of mild-to-moderate stenosis in the anterior and posterior circulations. CTA neck: 1. Patent carotid and vertebral arteries. No dissection, occlusion, or aneurysm. 2. Left proximal ICA moderate 50-70% stenosis with mixed plaque. 3. Segments of mild stenosis of proximal bilateral vertebral arteries. 4. Aortic Atherosclerosis (ICD10-I70.0) and Emphysema (ICD10-J43.9). These results were called by telephone at the time of interpretation on 05/01/2019 at 2:37 am to  Dr. Roland Rack , who verbally acknowledged these results. Electronically Signed   By: Kristine Garbe M.D.   On: 05/01/2019 02:41   Ct Head Code Stroke Wo Contrast  Result Date: 05/01/2019 CLINICAL DATA:  Code stroke. 83 y/o F; left-sided weakness and slurred speech. EXAM: CT HEAD WITHOUT CONTRAST TECHNIQUE: Contiguous axial images were obtained from the base of the skull through the vertex without intravenous contrast. COMPARISON:  08/29/2018 CT head. FINDINGS: Brain: Interval small age-indeterminate infarcts are present within the right caudate head and left cerebellar hemisphere. Stable small chronic infarct within the left anterior putamen. No large acute vascular territory infarction, hemorrhage, mass effect, extra-axial collection, hydrocephalus, or herniation. Stable nonspecific white matter hypodensities compatible with chronic microvascular ischemic changes and stable volume loss of the brain. Vascular: Calcific atherosclerosis of carotid siphons and vertebral arteries. No hyperdense vessel identified. Skull: Normal. Negative for fracture or focal lesion. Sinuses/Orbits: Mucosal thickening of the right maxillary sinus and right anterior ethmoid air cells. Chronic inflammatory changes of the walls of the right maxillary sinus. Opacification of bilateral mastoid air cells. Orbits are unremarkable. Other: None. ASPECTS [facility] Stroke Program Early CT Score) - Ganglionic level infarction (caudate, lentiform nuclei, internal capsule, insula, M1-M3 cortex): 7 - Supraganglionic infarction (M4-M6 cortex): 3 Total score (0-10 with 10 being normal): 10 IMPRESSION: 1. Interval small age-indeterminate infarcts within the right caudate head and left cerebellar hemisphere. 2. Otherwise no large acute stroke, hemorrhage, or mass effect identified. ASPECTS is 10. 3. Stable chronic microvascular ischemic changes and parenchymal volume loss of the brain. These results were called by telephone at the  time of interpretation on 05/01/2019 at 2:06 am to Dr. Leonel Ramsay, who verbally acknowledged these results. Electronically Signed   By: Kristine Garbe M.D.   On: 05/01/2019 02:06     EKG: Independently reviewed.  Sinus rhythm, QTC 464, early R wave progression, Q waves in lead III.  Assessment/Plan Principal Problem:   Stroke [facility]) Active Problems:   Normocytic anemia   Hypokalemia   Hypertension   COPD (chronic obstructive pulmonary disease) (H[facility])   Chronic respiratory failure (H[facility])   Depression   Acute renal failure superimposed on stage 3 chronic kidney disease ([place])   Stroke [facility]): his symptoms is mostly due to stroke.  CT-head showed interval small age-indeterminate infarcts within the right caudate head and left cerebellar hemisphere. CTA of head and neck and cerebral perfusion revealed no LVO or acute stroke, but showed left proximal ICA moderate 50-70% stenosis with mixed plaque. Neurology, Dr. Deneise Lever was consulted--> recommended stroke work-up.  - will place on tele bed for obs - Highly appreciated neurologist's consultation,will follow up recommendations as follows: Recommendations:  - HgbA1c, fasting lipid panel  - MRI of the brain without contrast  - Frequent neuro checks  - Echocardiogram  - Prophylactic therapy-Antiplatelet med: Aspirin - dose 325mg  PO or 300mg  PR  - Risk factor modification  - Telemetry monitoring  - PT  consult, OT consult, Speech consult  - Stroke team to follow - will hold oral Bp meds to allow permissive HTN in the setting of acute stroke  - start lipitor 80 mg daily  Normocytic anemia: Hgb 11.7 on 09/03/18-->8.4 now -check anemia panel  Hypokalemia: K=3.4 -repleated  Hypertension: - will hold amlodipine to allow permissive HTN in the setting of acute stroke  -IV Hydralazine as needed for SBP.220 or dBP.120  COPD and dhronic respiratory failure:  -Atrovent inhaler and prn albuterol nebs, prn mucinex -Continue nasal  cannula oxygen to maintain oxygen saturation above 93%  Depression: -continue home Cymbalta  Acute renal failure superimposed on stage 3 chronic kidney disease ([place]): Baseline Cre is <1.0 , pt's Cre is 1.30 and BUN 43 on admission. Likely due to dehydration. - IVF: NS 100 cc/h - Follow up renal function by BMP   DVT ppx:   SQ Lovenox Code Status: Full code Family Communication: None at bed side.     Disposition Plan:  Anticipate discharge back to previous home environment Consults called:  Dr. Leonel Ramsay Admission status: Obs / tele    Date of Service 05/01/2019    [facility]   If 7PM-7AM, please contact night-coverage [email or web address] Password [facility] 05/01/2019, 4:39 AM

## 2019-05-01 NOTE — Progress Notes (Signed)
Rehab Admissions Coordinator Note:  Patient was screened by Cleatrice Burke for appropriateness for an Inpatient Acute Rehab Consult per PT recs.  At this time, we are recommending Inpatient Rehab consult.  Cleatrice Burke RN MSN 05/01/2019, 3:18 PM  I can be reached at [phone].

## 2019-05-01 NOTE — H&P (View-Only) (Signed)
History and Physical    [name] GYI:[id number] DOB: [birth year] DOA: 05/01/2019  Referring MD/NP/PA:   PCP: [name], MD   Patient coming from:  The patient is coming from home.  At baseline, pt is partially dependent for most of ADL.        Chief Complaint: Left-sided weakness, slurred speech  HPI: [name] is a 83 y.o. female with medical history significant of hypertension, COPD on 2.5 L oxygen at home, depression, chronic back pain, anemia, CKD 3, who presents with left-sided weakness and  slurred speech.   Per report, pt was noted to have left-sided weakness, and slurred speech at about 5 PM. She normally can walk by herself, but now she is not able to. No vision loss or hearing loss.  Patient has mild cough, no worsening shortness of breath.  Denies chest pain, fever or chills.  No nausea, vomiting, diarrhea, abdominal pain, symptoms of UTI. She states that has chronic left knee joint pain.  ED Course: pt was found to have WBC 10.1, potassium 3.4, worsening renal function, temperature normal, no tachycardia, no tachypnea, oxygen saturation 99% on 2.5 L nasal cannula oxygen.  CT-head showed interval small age-indeterminate infarcts within the right caudate head and left cerebellar hemisphere. CTA of head and neck and cerebral perfusion revealed no LVO or acute stroke, but showed left proximal ICA moderate 50-70% stenosis with mixed plaque.  Patient is placed on telemetry bed for observation. Neurology, Dr. Deneise Lever was consulted.   Review of Systems:   General: no fevers, chills, no body weight gain, has fatigue HEENT: no blurry vision, hearing changes or sore throat Respiratory: no dyspnea, has mild coughing, no wheezing CV: no chest pain, no palpitations GI: no nausea, vomiting, abdominal pain, diarrhea, constipation GU: no dysuria, burning on urination, increased urinary frequency, hematuria  Ext: no leg edema Neuro: No vision change or hearing loss. Has  left sided weakness and slurry speech. Skin: no rash, no skin tear. MSK: has left knee pain. Heme: No easy bruising.  Travel history: No recent long distant travel.  Allergy:  Allergies  Allergen Reactions   Ambien [Zolpidem] Other (See Comments)    Hallucinations and paranoid    Past Medical History:  Diagnosis Date   Chronic back pain    Chronic back pain 08/22/2018   Colitis 03/19/2014   COPD (chronic obstructive pulmonary disease) ([place])    Hypertension    On home oxygen therapy 06-14-13   continuos 2.5l/m nasally-24/7   Osteoporosis    Oxygen dependent     Past Surgical History:  Procedure Laterality Date   ABDOMINAL HYSTERECTOMY     ESOPHAGOGASTRODUODENOSCOPY N/A 06/15/2013   Procedure: ESOPHAGOGASTRODUODENOSCOPY (EGD);  Surgeon: [name], MD;  Location: [facility];  Service: Endoscopy;  Laterality: N/A;   IR GENERIC HISTORICAL  10/15/2016   IR KYPHO LUMBAR INC FX REDUCE BONE BX UNI/BIL CANNULATION INC/IMAGING 10/15/2016 Luanne Bras, MD [facility]   sacroplasty  06-14-13   05-10-13 IVR [facility] for fracture stabilization    Social History:  reports that she quit smoking about 7 years ago. Her smoking use included cigarettes. She has never used smokeless tobacco. She reports that she does not drink alcohol or use drugs.  Family History:  Family History  Problem Relation Age of Onset   Hypertension Sister    Diabetes Mellitus II Sister      Prior to Admission medications   Medication Sig Start Date End Date Taking? Authorizing Provider  acetaminophen (  TYLENOL) 500 MG tablet Take 1 tablet (500 mg total) by mouth 3 (three) times daily. 08/22/18   Eugenie Filler, MD  albuterol (PROVENTIL HFA;VENTOLIN HFA) 108 (90 Base) MCG/ACT inhaler Inhale 1-2 puffs into the lungs every 4 (four) hours as needed for wheezing or shortness of breath. 06/09/18   Duffy Bruce, MD  amLODipine (NORVASC) 5 MG tablet Take 1 tablet (5 mg total) by mouth daily.  08/22/18   Eugenie Filler, MD  docusate sodium (COLACE) 250 MG capsule Take 1 capsule (250 mg total) by mouth daily. 06/09/18   Duffy Bruce, MD  DULoxetine (CYMBALTA) 30 MG capsule Take 30-60 capsules by mouth See admin instructions. 1 capsule daily for 1 week, increase to 2 capsules therafter 07/12/18   [provider]  feeding supplement, ENSURE ENLIVE, (ENSURE ENLIVE) LIQD Take 237 mLs by mouth 2 (two) times daily between meals. 09/03/18   Debbe Odea, MD  gabapentin (NEURONTIN) 100 MG capsule Take 100-300 mg by mouth at bedtime as needed for pain. 07/12/18   [provider]  lidocaine (LIDODERM) 5 % Place 1 patch onto the skin daily. Remove & Discard patch within 12 hours or as directed by MD 08/23/18   Eugenie Filler, MD  oxyCODONE (OXY IR/ROXICODONE) 5 MG immediate release tablet Take 1 tablet (5 mg total) by mouth every 6 (six) hours as needed for breakthrough pain. 09/03/18   Debbe Odea, MD  polyethylene glycol (MIRALAX / GLYCOLAX) packet Take 17 g by mouth daily as needed for mild constipation. Patient not taking: Reported on 08/18/2018 10/16/16   Bonnielee Haff, MD  senna (SENOKOT) 8.6 MG TABS tablet Take 1 tablet (8.6 mg total) by mouth 2 (two) times daily. 08/22/18   Eugenie Filler, MD  Syringe/Needle, Disp, (SYRINGE 3CC/22GX3/4") 22G X 3/4" 3 ML MISC 1,000 mcg by Does not apply route daily. 08/22/18   Eugenie Filler, MD  vitamin B-12 1000 MCG tablet Take 1 tablet (1,000 mcg total) by mouth daily. 09/04/18   Debbe Odea, MD    Physical Exam: Vitals:   05/01/19 0236 05/01/19 0245 05/01/19 0300 05/01/19 0435  BP: (!) 114/47 (!) 104/48 (!) 92/47 (!) 138/59  Pulse: 87   79  Resp: 18 18 17 18   Temp: 97.6 F (36.4 C)   98.2 F (36.8 C)  TempSrc: Oral   Oral  SpO2: 99%   100%  Weight:    60.8 kg  Height:    4\' 9"  (1.448 m)   General: Not in acute distress HEENT:       Eyes: PERRL, EOMI, no scleral icterus.       ENT: No discharge from the ears and  nose, no pharynx injection, no tonsillar enlargement.        Neck: No JVD, no bruit, no mass felt. Heme: No neck lymph node enlargement. Cardiac: S1/S2, RRR, No murmurs, No gallops or rubs. Respiratory: No rales, wheezing, rhonchi or rubs. GI: Soft, nondistended, nontender, no rebound pain, no organomegaly, BS present. GU: No hematuria Ext: No pitting leg edema bilaterally. 2+DP/PT pulse bilaterally. Musculoskeletal: No joint deformities, No joint redness or warmth. Skin: No rashes.  Neuro: Alert, oriented X3, cranial nerves II-XII grossly intact. Muscle strength 0-1/5 in left extremeties and 4/5 strength of the right arm and leg. Decreased sensation to light in the left side. Negative Babinski's sign.  Psych: Patient is not psychotic, no suicidal or hemocidal ideation.  Labs on Admission: I have personally reviewed following labs and imaging studies  CBC:  Recent Labs  Lab 05/01/19 0152  WBC 10.1  NEUTROABS 7.0  HGB 8.4*  HCT 28.5*  MCV 89.1  PLT 161*   Basic Metabolic Panel: Recent Labs  Lab 05/01/19 0152 05/01/19 0201  NA 142  --   K 3.4*  --   CL 103  --   CO2 30  --   GLUCOSE 116*  --   BUN 43*  --   CREATININE 1.39* 1.30*  CALCIUM 8.7*  --    GFR: Estimated Creatinine Clearance: 25.9 mL/min (A) (by C-G formula based on SCr of 1.3 mg/dL (H)). Liver Function Tests: Recent Labs  Lab 05/01/19 0152  AST 15  ALT 11  ALKPHOS 65  BILITOT 0.2*  PROT 6.1*  ALBUMIN 2.9*   No results for input(s): LIPASE, AMYLASE in the last 168 hours. No results for input(s): AMMONIA in the last 168 hours. Coagulation Profile: Recent Labs  Lab 05/01/19 0152  INR 1.0   Cardiac Enzymes: No results for input(s): CKTOTAL, CKMB, CKMBINDEX, TROPONINI in the last 168 hours. BNP (last 3 results) No results for input(s): PROBNP in the last 8760 hours. HbA1C: No results for input(s): HGBA1C in the last 72 hours. CBG: Recent Labs  Lab 05/01/19 0251  GLUCAP 127*   Lipid  Profile: No results for input(s): CHOL, HDL, LDLCALC, TRIG, CHOLHDL, LDLDIRECT in the last 72 hours. Thyroid Function Tests: No results for input(s): TSH, T4TOTAL, FREET4, T3FREE, THYROIDAB in the last 72 hours. Anemia Panel: No results for input(s): VITAMINB12, FOLATE, FERRITIN, TIBC, IRON, RETICCTPCT in the last 72 hours. Urine analysis:    Component Value Date/Time   COLORURINE YELLOW 08/18/2018 1820   APPEARANCEUR CLEAR 08/18/2018 1820   LABSPEC 1.019 08/18/2018 1820   PHURINE 5.0 08/18/2018 1820   GLUCOSEU NEGATIVE 08/18/2018 1820   HGBUR NEGATIVE 08/18/2018 1820   BILIRUBINUR NEGATIVE 08/18/2018 1820   KETONESUR 5 (A) 08/18/2018 1820   PROTEINUR 30 (A) 08/18/2018 1820   UROBILINOGEN 0.2 03/19/2014 1657   NITRITE NEGATIVE 08/18/2018 1820   LEUKOCYTESUR TRACE (A) 08/18/2018 1820   Sepsis Labs: @LABRCNTIP (procalcitonin:4,lacticidven:4) )No results found for this or any previous visit (from the past 240 hour(s)).   Radiological Exams on Admission: Ct Angio Head W Or Wo Contrast  Result Date: 05/01/2019 CLINICAL DATA:  83 y/o  F; left-sided weakness and slurred speech. EXAM: CT ANGIOGRAPHY HEAD AND NECK CT PERFUSION BRAIN TECHNIQUE: Multidetector CT imaging of the head and neck was performed using the standard protocol during bolus administration of intravenous contrast. Multiplanar CT image reconstructions and MIPs were obtained to evaluate the vascular anatomy. Carotid stenosis measurements (when applicable) are obtained utilizing NASCET criteria, using the distal internal carotid diameter as the denominator. Multiphase CT imaging of the brain was performed following IV bolus contrast injection. Subsequent parametric perfusion maps were calculated using RAPID software. CONTRAST:  144mL OMNIPAQUE IOHEXOL 350 MG/ML SOLN COMPARISON:  05/01/2019 CT head FINDINGS: CTA NECK FINDINGS Aortic arch: Left vertebral artery arises from the aorta. Imaged portion shows no evidence of aneurysm or  dissection. No significant stenosis of the major arch vessel origins. Aortic calcific atherosclerosis. Right carotid system: No evidence of dissection, stenosis (50% or greater) or occlusion. Left carotid system: Mixed plaque of left carotid bifurcation with moderate 50-70% proximal ICA stenosis. Vertebral arteries: Codominant. No evidence of dissection, stenosis (50% or greater) or occlusion. Mild stenosis of proximal right V2. calcified plaque of left vertebral artery origin with mild stenosis. Skeleton: No acute finding. Other neck: Negative. Upper chest: Centrilobular emphysema.  Review of the MIP images confirms the above findings CTA HEAD FINDINGS Anterior circulation: Calcific atherosclerosis of the carotid siphons with moderate right and mild left paraclinoid ICA stenosis. Moderate right A2 and mild left A2 stenosis. Otherwise no significant stenosis, proximal occlusion, aneurysm, or vascular malformation. Posterior circulation: Moderate right P1 and mild left P1 stenosis. Upper basilar mild stenosis. Otherwise no significant stenosis, proximal occlusion, aneurysm, or vascular malformation. Venous sinuses: As permitted by contrast timing, patent. Anatomic variants: None significant. Review of the MIP images confirms the above findings CT Brain Perfusion Findings: ASPECTS: 10 CBF (<30%) Volume: 69mL Perfusion (Tmax>6.0s) volume: 81mL Mismatch Volume: 4mL Infarction Location:Not applicable. IMPRESSION: CT brain perfusion: 1. No acute stroke or significant oligemia by standard perfusion criteria. ASPECTS is 10. CTA head: 1. No large vessel occlusion, aneurysm, or vascular malformation identified. 2. Extensive intracranial atherosclerosis with multiple areas of mild-to-moderate stenosis in the anterior and posterior circulations. CTA neck: 1. Patent carotid and vertebral arteries. No dissection, occlusion, or aneurysm. 2. Left proximal ICA moderate 50-70% stenosis with mixed plaque. 3. Segments of mild stenosis of  proximal bilateral vertebral arteries. 4. Aortic Atherosclerosis (ICD10-I70.0) and Emphysema (ICD10-J43.9). These results were called by telephone at the time of interpretation on 05/01/2019 at 2:37 am to Dr. Roland Rack , who verbally acknowledged these results. Electronically Signed   By: Kristine Garbe M.D.   On: 05/01/2019 02:41   Ct Angio Neck W Or Wo Contrast  Result Date: 05/01/2019 CLINICAL DATA:  83 y/o  F; left-sided weakness and slurred speech. EXAM: CT ANGIOGRAPHY HEAD AND NECK CT PERFUSION BRAIN TECHNIQUE: Multidetector CT imaging of the head and neck was performed using the standard protocol during bolus administration of intravenous contrast. Multiplanar CT image reconstructions and MIPs were obtained to evaluate the vascular anatomy. Carotid stenosis measurements (when applicable) are obtained utilizing NASCET criteria, using the distal internal carotid diameter as the denominator. Multiphase CT imaging of the brain was performed following IV bolus contrast injection. Subsequent parametric perfusion maps were calculated using RAPID software. CONTRAST:  185mL OMNIPAQUE IOHEXOL 350 MG/ML SOLN COMPARISON:  05/01/2019 CT head FINDINGS: CTA NECK FINDINGS Aortic arch: Left vertebral artery arises from the aorta. Imaged portion shows no evidence of aneurysm or dissection. No significant stenosis of the major arch vessel origins. Aortic calcific atherosclerosis. Right carotid system: No evidence of dissection, stenosis (50% or greater) or occlusion. Left carotid system: Mixed plaque of left carotid bifurcation with moderate 50-70% proximal ICA stenosis. Vertebral arteries: Codominant. No evidence of dissection, stenosis (50% or greater) or occlusion. Mild stenosis of proximal right V2. calcified plaque of left vertebral artery origin with mild stenosis. Skeleton: No acute finding. Other neck: Negative. Upper chest: Centrilobular emphysema. Review of the MIP images confirms the above  findings CTA HEAD FINDINGS Anterior circulation: Calcific atherosclerosis of the carotid siphons with moderate right and mild left paraclinoid ICA stenosis. Moderate right A2 and mild left A2 stenosis. Otherwise no significant stenosis, proximal occlusion, aneurysm, or vascular malformation. Posterior circulation: Moderate right P1 and mild left P1 stenosis. Upper basilar mild stenosis. Otherwise no significant stenosis, proximal occlusion, aneurysm, or vascular malformation. Venous sinuses: As permitted by contrast timing, patent. Anatomic variants: None significant. Review of the MIP images confirms the above findings CT Brain Perfusion Findings: ASPECTS: 10 CBF (<30%) Volume: 85mL Perfusion (Tmax>6.0s) volume: 58mL Mismatch Volume: 46mL Infarction Location:Not applicable. IMPRESSION: CT brain perfusion: 1. No acute stroke or significant oligemia by standard perfusion criteria. ASPECTS is 10. CTA head: 1. No large vessel  occlusion, aneurysm, or vascular malformation identified. 2. Extensive intracranial atherosclerosis with multiple areas of mild-to-moderate stenosis in the anterior and posterior circulations. CTA neck: 1. Patent carotid and vertebral arteries. No dissection, occlusion, or aneurysm. 2. Left proximal ICA moderate 50-70% stenosis with mixed plaque. 3. Segments of mild stenosis of proximal bilateral vertebral arteries. 4. Aortic Atherosclerosis (ICD10-I70.0) and Emphysema (ICD10-J43.9). These results were called by telephone at the time of interpretation on 05/01/2019 at 2:37 am to Dr. Roland Rack , who verbally acknowledged these results. Electronically Signed   By: Kristine Garbe M.D.   On: 05/01/2019 02:41   Mr Brain Wo Contrast  Result Date: 05/01/2019 CLINICAL DATA:  Left-sided weakness and slurred speech EXAM: MRI HEAD WITHOUT CONTRAST TECHNIQUE: Multiplanar, multiecho pulse sequences of the brain and surrounding structures were obtained without intravenous contrast. COMPARISON:   CTA head neck from earlier today FINDINGS: Brain: Moderate acute infarction in the right pons respecting the midline. No prior infarct is seen. There is minimal chronic small vessel ischemic type change in the cerebral white matter. Brain volume is normal. No acute hemorrhage, hydrocephalus, or masslike finding. Vascular: Major flow voids are preserved. CTA was performed earlier today Skull and upper cervical spine: Negative for marrow lesion. Degenerative cervical facet spurring. Sinuses/Orbits: Bilateral mastoid opacification with negative nasopharynx. Mastoid opacification was also seen on head CT 1 year prior. Other: Intermittent motion degradation. IMPRESSION: Acute right pontine infarction. Electronically Signed   By: Monte Fantasia M.D.   On: 05/01/2019 04:28   Ct Cerebral Perfusion W Contrast  Result Date: 05/01/2019 CLINICAL DATA:  83 y/o  F; left-sided weakness and slurred speech. EXAM: CT ANGIOGRAPHY HEAD AND NECK CT PERFUSION BRAIN TECHNIQUE: Multidetector CT imaging of the head and neck was performed using the standard protocol during bolus administration of intravenous contrast. Multiplanar CT image reconstructions and MIPs were obtained to evaluate the vascular anatomy. Carotid stenosis measurements (when applicable) are obtained utilizing NASCET criteria, using the distal internal carotid diameter as the denominator. Multiphase CT imaging of the brain was performed following IV bolus contrast injection. Subsequent parametric perfusion maps were calculated using RAPID software. CONTRAST:  131mL OMNIPAQUE IOHEXOL 350 MG/ML SOLN COMPARISON:  05/01/2019 CT head FINDINGS: CTA NECK FINDINGS Aortic arch: Left vertebral artery arises from the aorta. Imaged portion shows no evidence of aneurysm or dissection. No significant stenosis of the major arch vessel origins. Aortic calcific atherosclerosis. Right carotid system: No evidence of dissection, stenosis (50% or greater) or occlusion. Left carotid  system: Mixed plaque of left carotid bifurcation with moderate 50-70% proximal ICA stenosis. Vertebral arteries: Codominant. No evidence of dissection, stenosis (50% or greater) or occlusion. Mild stenosis of proximal right V2. calcified plaque of left vertebral artery origin with mild stenosis. Skeleton: No acute finding. Other neck: Negative. Upper chest: Centrilobular emphysema. Review of the MIP images confirms the above findings CTA HEAD FINDINGS Anterior circulation: Calcific atherosclerosis of the carotid siphons with moderate right and mild left paraclinoid ICA stenosis. Moderate right A2 and mild left A2 stenosis. Otherwise no significant stenosis, proximal occlusion, aneurysm, or vascular malformation. Posterior circulation: Moderate right P1 and mild left P1 stenosis. Upper basilar mild stenosis. Otherwise no significant stenosis, proximal occlusion, aneurysm, or vascular malformation. Venous sinuses: As permitted by contrast timing, patent. Anatomic variants: None significant. Review of the MIP images confirms the above findings CT Brain Perfusion Findings: ASPECTS: 10 CBF (<30%) Volume: 47mL Perfusion (Tmax>6.0s) volume: 37mL Mismatch Volume: 34mL Infarction Location:Not applicable. IMPRESSION: CT brain perfusion: 1. No acute stroke  or significant oligemia by standard perfusion criteria. ASPECTS is 10. CTA head: 1. No large vessel occlusion, aneurysm, or vascular malformation identified. 2. Extensive intracranial atherosclerosis with multiple areas of mild-to-moderate stenosis in the anterior and posterior circulations. CTA neck: 1. Patent carotid and vertebral arteries. No dissection, occlusion, or aneurysm. 2. Left proximal ICA moderate 50-70% stenosis with mixed plaque. 3. Segments of mild stenosis of proximal bilateral vertebral arteries. 4. Aortic Atherosclerosis (ICD10-I70.0) and Emphysema (ICD10-J43.9). These results were called by telephone at the time of interpretation on 05/01/2019 at 2:37 am to  Dr. Roland Rack , who verbally acknowledged these results. Electronically Signed   By: Kristine Garbe M.D.   On: 05/01/2019 02:41   Ct Head Code Stroke Wo Contrast  Result Date: 05/01/2019 CLINICAL DATA:  Code stroke. 83 y/o F; left-sided weakness and slurred speech. EXAM: CT HEAD WITHOUT CONTRAST TECHNIQUE: Contiguous axial images were obtained from the base of the skull through the vertex without intravenous contrast. COMPARISON:  08/29/2018 CT head. FINDINGS: Brain: Interval small age-indeterminate infarcts are present within the right caudate head and left cerebellar hemisphere. Stable small chronic infarct within the left anterior putamen. No large acute vascular territory infarction, hemorrhage, mass effect, extra-axial collection, hydrocephalus, or herniation. Stable nonspecific white matter hypodensities compatible with chronic microvascular ischemic changes and stable volume loss of the brain. Vascular: Calcific atherosclerosis of carotid siphons and vertebral arteries. No hyperdense vessel identified. Skull: Normal. Negative for fracture or focal lesion. Sinuses/Orbits: Mucosal thickening of the right maxillary sinus and right anterior ethmoid air cells. Chronic inflammatory changes of the walls of the right maxillary sinus. Opacification of bilateral mastoid air cells. Orbits are unremarkable. Other: None. ASPECTS [facility] Stroke Program Early CT Score) - Ganglionic level infarction (caudate, lentiform nuclei, internal capsule, insula, M1-M3 cortex): 7 - Supraganglionic infarction (M4-M6 cortex): 3 Total score (0-10 with 10 being normal): 10 IMPRESSION: 1. Interval small age-indeterminate infarcts within the right caudate head and left cerebellar hemisphere. 2. Otherwise no large acute stroke, hemorrhage, or mass effect identified. ASPECTS is 10. 3. Stable chronic microvascular ischemic changes and parenchymal volume loss of the brain. These results were called by telephone at the  time of interpretation on 05/01/2019 at 2:06 am to Dr. Leonel Ramsay, who verbally acknowledged these results. Electronically Signed   By: Kristine Garbe M.D.   On: 05/01/2019 02:06     EKG: Independently reviewed.  Sinus rhythm, QTC 464, early R wave progression, Q waves in lead III.  Assessment/Plan Principal Problem:   Stroke [facility]) Active Problems:   Normocytic anemia   Hypokalemia   Hypertension   COPD (chronic obstructive pulmonary disease) ([facility])   Chronic respiratory failure ([facility])   Depression   Acute renal failure superimposed on stage 3 chronic kidney disease ([place])   Stroke [facility]): his symptoms is mostly due to stroke.  CT-head showed interval small age-indeterminate infarcts within the right caudate head and left cerebellar hemisphere. CTA of head and neck and cerebral perfusion revealed no LVO or acute stroke, but showed left proximal ICA moderate 50-70% stenosis with mixed plaque. Neurology, Dr. Deneise Lever was consulted--> recommended stroke work-up.  - will place on tele bed for obs - Highly appreciated neurologist's consultation,will follow up recommendations as follows: Recommendations:  - HgbA1c, fasting lipid panel  - MRI of the brain without contrast  - Frequent neuro checks  - Echocardiogram  - Prophylactic therapy-Antiplatelet med: Aspirin - dose 325mg  PO or 300mg  PR  - Risk factor modification  - Telemetry monitoring  - PT  consult, OT consult, Speech consult  - Stroke team to follow - will hold oral Bp meds to allow permissive HTN in the setting of acute stroke  - start lipitor 80 mg daily  Normocytic anemia: Hgb 11.7 on 09/03/18-->8.4 now -check anemia panel  Hypokalemia: K=3.4 -repleated  Hypertension: - will hold amlodipine to allow permissive HTN in the setting of acute stroke  -IV Hydralazine as needed for SBP.220 or dBP.120  COPD and dhronic respiratory failure:  -Atrovent inhaler and prn albuterol nebs, prn mucinex -Continue nasal  cannula oxygen to maintain oxygen saturation above 93%  Depression: -continue home Cymbalta  Acute renal failure superimposed on stage 3 chronic kidney disease ([place]): Baseline Cre is <1.0 , pt's Cre is 1.30 and BUN 43 on admission. Likely due to dehydration. - IVF: NS 100 cc/h - Follow up renal function by BMP   DVT ppx:   SQ Lovenox Code Status: Full code Family Communication: None at bed side.     Disposition Plan:  Anticipate discharge back to previous home environment Consults called:  Dr. Leonel Ramsay Admission status: Obs / tele    Date of Service 05/01/2019    [facility]   If 7PM-7AM, please contact night-coverage [email or web address] Password [facility] 05/01/2019, 4:39 AM

## 2019-05-01 NOTE — ED Notes (Signed)
ED TO INPATIENT HANDOFF REPORT  ED Nurse Name and Phone #: Alveta Heimlich RN [phone]  S Name/Age/Gender [name] 83 y.o. female Room/Bed: 028C/028C  Code Status   Code Status: Full Code  Home/SNF/Other Home Patient oriented to: self, place, time and situation Is this baseline? Yes      Chief Complaint code stroke  Triage Note No notes on file   Allergies Allergies  Allergen Reactions  . Ambien [Zolpidem] Other (See Comments)    Hallucinations and paranoid    Level of Care/Admitting Diagnosis ED Disposition    ED Disposition Condition [facility] Area: [place] [[id number]]  Level of Care: Telemetry Medical [308]  I expect the patient will be discharged within 24 hours: No (not a candidate for 5C-Observation unit)  Covid Evaluation: N/A  Diagnosis: Stroke [facility]) [[id number]]  Admitting Physician: Ivor Costa [[id number]]  Attending Physician: Ivor Costa [[id number]]  PT Class (Do Not Modify): Observation [104]  PT Acc Code (Do Not Modify): Observation [10022]       B Medical/Surgery History Past Medical History:  Diagnosis Date  . Chronic back pain   . Chronic back pain 08/22/2018  . Colitis 03/19/2014  . COPD (chronic obstructive pulmonary disease) ([place])   . Hypertension   . On home oxygen therapy 06-14-13   continuos 2.5l/m nasally-24/7  . Osteoporosis   . Oxygen dependent    Past Surgical History:  Procedure Laterality Date  . ABDOMINAL HYSTERECTOMY    . ESOPHAGOGASTRODUODENOSCOPY N/A 06/15/2013   Procedure: ESOPHAGOGASTRODUODENOSCOPY (EGD);  Surgeon: [name], MD;  Location: [facility];  Service: Endoscopy;  Laterality: N/A;  . IR GENERIC HISTORICAL  10/15/2016   IR KYPHO LUMBAR INC FX REDUCE BONE BX UNI/BIL CANNULATION INC/IMAGING 10/15/2016 Luanne Bras, MD [facility]  . sacroplasty  06-14-13   05-10-13 IVR CONE for fracture stabilization     A IV Location/Drains/Wounds Patient Lines/Drains/Airways Status   Active  Line/Drains/Airways    Name:   Placement date:   Placement time:   Site:   Days:   External Urinary Catheter   08/30/18    2000    -   244          Intake/Output Last 24 hours No intake or output data in the 24 hours ending 05/01/19 0349  Labs/Imaging Results for orders placed or performed during the hospital encounter of 05/01/19 (from the past 48 hour(s))  Protime-INR     Status: None   Collection Time: 05/01/19  1:52 AM  Result Value Ref Range   Prothrombin Time 13.0 11.4 - 15.2 seconds   INR 1.0 0.8 - 1.2    Comment: (NOTE) INR goal varies based on device and disease states. Performed at [facility], [place] [address], [place], [place] [address]   APTT     Status: None   Collection Time: 05/01/19  1:52 AM  Result Value Ref Range   aPTT 27 24 - 36 seconds    Comment: Performed at [facility] [address] [place],  [address]  CBC     Status: Abnormal   Collection Time: 05/01/19  1:52 AM  Result Value Ref Range   WBC 10.1 4.0 - 10.5 K/uL   RBC 3.20 (L) 3.87 - 5.11 MIL/uL   Hemoglobin 8.4 (L) 12.0 - 15.0 g/dL   HCT 28.5 (L) 36.0 - 46.0 %   MCV 89.1 80.0 - 100.0 fL   MCH 26.3 26.0 - 34.0 pg  MCHC 29.5 (L) 30.0 - 36.0 g/dL   RDW 14.0 11.5 - 15.5 %   Platelets 499 (H) 150 - 400 K/uL   nRBC 0.0 0.0 - 0.2 %    Comment: Performed at [facility] [address], [place], [place] [address]  Differential     Status: None   Collection Time: 05/01/19  1:52 AM  Result Value Ref Range   Neutrophils Relative % 68 %   Neutro Abs 7.0 1.7 - 7.7 K/uL   Lymphocytes Relative 18 %   Lymphs Abs 1.8 0.7 - 4.0 K/uL   Monocytes Relative 8 %   Monocytes Absolute 0.8 0.1 - 1.0 K/uL   Eosinophils Relative 4 %   Eosinophils Absolute 0.4 0.0 - 0.5 K/uL   Basophils Relative 1 %   Basophils Absolute 0.1 0.0 - 0.1 K/uL   Immature Granulocytes 1 %   Abs Immature Granulocytes 0.07 0.00 - 0.07 K/uL    Comment: Performed at [facility] [address],  [place], [place] [address]  Comprehensive metabolic panel     Status: Abnormal   Collection Time: 05/01/19  1:52 AM  Result Value Ref Range   Sodium 142 135 - 145 mmol/L   Potassium 3.4 (L) 3.5 - 5.1 mmol/L   Chloride 103 98 - 111 mmol/L   CO2 30 22 - 32 mmol/L   Glucose, Bld 116 (H) 70 - 99 mg/dL   BUN 43 (H) 8 - 23 mg/dL   Creatinine, Ser 1.39 (H) 0.44 - 1.00 mg/dL   Calcium 8.7 (L) 8.9 - 10.3 mg/dL   Total Protein 6.1 (L) 6.5 - 8.1 g/dL   Albumin 2.9 (L) 3.5 - 5.0 g/dL   AST 15 15 - 41 U/L   ALT 11 0 - 44 U/L   Alkaline Phosphatase 65 38 - 126 U/L   Total Bilirubin 0.2 (L) 0.3 - 1.2 mg/dL   GFR calc non Af Amer 36 (L) >60 mL/min   GFR calc Af Amer 41 (L) >60 mL/min   Anion gap 9 5 - 15    Comment: Performed at [facility], [place] [address] [place], [place] [address]  I-stat Creatinine, ED     Status: Abnormal   Collection Time: 05/01/19  2:01 AM  Result Value Ref Range   Creatinine, Ser 1.30 (H) 0.44 - 1.00 mg/dL  CBG monitoring, ED     Status: Abnormal   Collection Time: 05/01/19  2:51 AM  Result Value Ref Range   Glucose-Capillary 127 (H) 70 - 99 mg/dL   Comment 1 Notify RN    Comment 2 Document in Chart    Ct Angio Head W Or Wo Contrast  Result Date: 05/01/2019 CLINICAL DATA:  83 y/o  F; left-sided weakness and slurred speech. EXAM: CT ANGIOGRAPHY HEAD AND NECK CT PERFUSION BRAIN TECHNIQUE: Multidetector CT imaging of the head and neck was performed using the standard protocol during bolus administration of intravenous contrast. Multiplanar CT image reconstructions and MIPs were obtained to evaluate the vascular anatomy. Carotid stenosis measurements (when applicable) are obtained utilizing NASCET criteria, using the distal internal carotid diameter as the denominator. Multiphase CT imaging of the brain was performed following IV bolus contrast injection. Subsequent parametric perfusion maps were calculated using RAPID software. CONTRAST:  145mL OMNIPAQUE IOHEXOL 350 MG/ML  SOLN COMPARISON:  05/01/2019 CT head FINDINGS: CTA NECK FINDINGS Aortic arch: Left vertebral artery arises from the aorta. Imaged portion shows no evidence of aneurysm or dissection. No  significant stenosis of the major arch vessel origins. Aortic calcific atherosclerosis. Right carotid system: No evidence of dissection, stenosis (50% or greater) or occlusion. Left carotid system: Mixed plaque of left carotid bifurcation with moderate 50-70% proximal ICA stenosis. Vertebral arteries: Codominant. No evidence of dissection, stenosis (50% or greater) or occlusion. Mild stenosis of proximal right V2. calcified plaque of left vertebral artery origin with mild stenosis. Skeleton: No acute finding. Other neck: Negative. Upper chest: Centrilobular emphysema. Review of the MIP images confirms the above findings CTA HEAD FINDINGS Anterior circulation: Calcific atherosclerosis of the carotid siphons with moderate right and mild left paraclinoid ICA stenosis. Moderate right A2 and mild left A2 stenosis. Otherwise no significant stenosis, proximal occlusion, aneurysm, or vascular malformation. Posterior circulation: Moderate right P1 and mild left P1 stenosis. Upper basilar mild stenosis. Otherwise no significant stenosis, proximal occlusion, aneurysm, or vascular malformation. Venous sinuses: As permitted by contrast timing, patent. Anatomic variants: None significant. Review of the MIP images confirms the above findings CT Brain Perfusion Findings: ASPECTS: 10 CBF (<30%) Volume: 38mL Perfusion (Tmax>6.0s) volume: 34mL Mismatch Volume: 18mL Infarction Location:Not applicable. IMPRESSION: CT brain perfusion: 1. No acute stroke or significant oligemia by standard perfusion criteria. ASPECTS is 10. CTA head: 1. No large vessel occlusion, aneurysm, or vascular malformation identified. 2. Extensive intracranial atherosclerosis with multiple areas of mild-to-moderate stenosis in the anterior and posterior circulations. CTA neck: 1.  Patent carotid and vertebral arteries. No dissection, occlusion, or aneurysm. 2. Left proximal ICA moderate 50-70% stenosis with mixed plaque. 3. Segments of mild stenosis of proximal bilateral vertebral arteries. 4. Aortic Atherosclerosis (ICD10-I70.0) and Emphysema (ICD10-J43.9). These results were called by telephone at the time of interpretation on 05/01/2019 at 2:37 am to Dr. Roland Rack , who verbally acknowledged these results. Electronically Signed   By: Kristine Garbe M.D.   On: 05/01/2019 02:41   Ct Angio Neck W Or Wo Contrast  Result Date: 05/01/2019 CLINICAL DATA:  83 y/o  F; left-sided weakness and slurred speech. EXAM: CT ANGIOGRAPHY HEAD AND NECK CT PERFUSION BRAIN TECHNIQUE: Multidetector CT imaging of the head and neck was performed using the standard protocol during bolus administration of intravenous contrast. Multiplanar CT image reconstructions and MIPs were obtained to evaluate the vascular anatomy. Carotid stenosis measurements (when applicable) are obtained utilizing NASCET criteria, using the distal internal carotid diameter as the denominator. Multiphase CT imaging of the brain was performed following IV bolus contrast injection. Subsequent parametric perfusion maps were calculated using RAPID software. CONTRAST:  163mL OMNIPAQUE IOHEXOL 350 MG/ML SOLN COMPARISON:  05/01/2019 CT head FINDINGS: CTA NECK FINDINGS Aortic arch: Left vertebral artery arises from the aorta. Imaged portion shows no evidence of aneurysm or dissection. No significant stenosis of the major arch vessel origins. Aortic calcific atherosclerosis. Right carotid system: No evidence of dissection, stenosis (50% or greater) or occlusion. Left carotid system: Mixed plaque of left carotid bifurcation with moderate 50-70% proximal ICA stenosis. Vertebral arteries: Codominant. No evidence of dissection, stenosis (50% or greater) or occlusion. Mild stenosis of proximal right V2. calcified plaque of left  vertebral artery origin with mild stenosis. Skeleton: No acute finding. Other neck: Negative. Upper chest: Centrilobular emphysema. Review of the MIP images confirms the above findings CTA HEAD FINDINGS Anterior circulation: Calcific atherosclerosis of the carotid siphons with moderate right and mild left paraclinoid ICA stenosis. Moderate right A2 and mild left A2 stenosis. Otherwise no significant stenosis, proximal occlusion, aneurysm, or vascular malformation. Posterior circulation: Moderate right P1 and mild left P1 stenosis.  Upper basilar mild stenosis. Otherwise no significant stenosis, proximal occlusion, aneurysm, or vascular malformation. Venous sinuses: As permitted by contrast timing, patent. Anatomic variants: None significant. Review of the MIP images confirms the above findings CT Brain Perfusion Findings: ASPECTS: 10 CBF (<30%) Volume: 32mL Perfusion (Tmax>6.0s) volume: 69mL Mismatch Volume: 75mL Infarction Location:Not applicable. IMPRESSION: CT brain perfusion: 1. No acute stroke or significant oligemia by standard perfusion criteria. ASPECTS is 10. CTA head: 1. No large vessel occlusion, aneurysm, or vascular malformation identified. 2. Extensive intracranial atherosclerosis with multiple areas of mild-to-moderate stenosis in the anterior and posterior circulations. CTA neck: 1. Patent carotid and vertebral arteries. No dissection, occlusion, or aneurysm. 2. Left proximal ICA moderate 50-70% stenosis with mixed plaque. 3. Segments of mild stenosis of proximal bilateral vertebral arteries. 4. Aortic Atherosclerosis (ICD10-I70.0) and Emphysema (ICD10-J43.9). These results were called by telephone at the time of interpretation on 05/01/2019 at 2:37 am to Dr. Roland Rack , who verbally acknowledged these results. Electronically Signed   By: Kristine Garbe M.D.   On: 05/01/2019 02:41   Ct Cerebral Perfusion W Contrast  Result Date: 05/01/2019 CLINICAL DATA:  83 y/o  F; left-sided  weakness and slurred speech. EXAM: CT ANGIOGRAPHY HEAD AND NECK CT PERFUSION BRAIN TECHNIQUE: Multidetector CT imaging of the head and neck was performed using the standard protocol during bolus administration of intravenous contrast. Multiplanar CT image reconstructions and MIPs were obtained to evaluate the vascular anatomy. Carotid stenosis measurements (when applicable) are obtained utilizing NASCET criteria, using the distal internal carotid diameter as the denominator. Multiphase CT imaging of the brain was performed following IV bolus contrast injection. Subsequent parametric perfusion maps were calculated using RAPID software. CONTRAST:  160mL OMNIPAQUE IOHEXOL 350 MG/ML SOLN COMPARISON:  05/01/2019 CT head FINDINGS: CTA NECK FINDINGS Aortic arch: Left vertebral artery arises from the aorta. Imaged portion shows no evidence of aneurysm or dissection. No significant stenosis of the major arch vessel origins. Aortic calcific atherosclerosis. Right carotid system: No evidence of dissection, stenosis (50% or greater) or occlusion. Left carotid system: Mixed plaque of left carotid bifurcation with moderate 50-70% proximal ICA stenosis. Vertebral arteries: Codominant. No evidence of dissection, stenosis (50% or greater) or occlusion. Mild stenosis of proximal right V2. calcified plaque of left vertebral artery origin with mild stenosis. Skeleton: No acute finding. Other neck: Negative. Upper chest: Centrilobular emphysema. Review of the MIP images confirms the above findings CTA HEAD FINDINGS Anterior circulation: Calcific atherosclerosis of the carotid siphons with moderate right and mild left paraclinoid ICA stenosis. Moderate right A2 and mild left A2 stenosis. Otherwise no significant stenosis, proximal occlusion, aneurysm, or vascular malformation. Posterior circulation: Moderate right P1 and mild left P1 stenosis. Upper basilar mild stenosis. Otherwise no significant stenosis, proximal occlusion, aneurysm,  or vascular malformation. Venous sinuses: As permitted by contrast timing, patent. Anatomic variants: None significant. Review of the MIP images confirms the above findings CT Brain Perfusion Findings: ASPECTS: 10 CBF (<30%) Volume: 2mL Perfusion (Tmax>6.0s) volume: 76mL Mismatch Volume: 41mL Infarction Location:Not applicable. IMPRESSION: CT brain perfusion: 1. No acute stroke or significant oligemia by standard perfusion criteria. ASPECTS is 10. CTA head: 1. No large vessel occlusion, aneurysm, or vascular malformation identified. 2. Extensive intracranial atherosclerosis with multiple areas of mild-to-moderate stenosis in the anterior and posterior circulations. CTA neck: 1. Patent carotid and vertebral arteries. No dissection, occlusion, or aneurysm. 2. Left proximal ICA moderate 50-70% stenosis with mixed plaque. 3. Segments of mild stenosis of proximal bilateral vertebral arteries. 4. Aortic Atherosclerosis (ICD10-I70.0)  and Emphysema (ICD10-J43.9). These results were called by telephone at the time of interpretation on 05/01/2019 at 2:37 am to Dr. Roland Rack , who verbally acknowledged these results. Electronically Signed   By: Kristine Garbe M.D.   On: 05/01/2019 02:41   Ct Head Code Stroke Wo Contrast  Result Date: 05/01/2019 CLINICAL DATA:  Code stroke. 83 y/o F; left-sided weakness and slurred speech. EXAM: CT HEAD WITHOUT CONTRAST TECHNIQUE: Contiguous axial images were obtained from the base of the skull through the vertex without intravenous contrast. COMPARISON:  08/29/2018 CT head. FINDINGS: Brain: Interval small age-indeterminate infarcts are present within the right caudate head and left cerebellar hemisphere. Stable small chronic infarct within the left anterior putamen. No large acute vascular territory infarction, hemorrhage, mass effect, extra-axial collection, hydrocephalus, or herniation. Stable nonspecific white matter hypodensities compatible with chronic microvascular  ischemic changes and stable volume loss of the brain. Vascular: Calcific atherosclerosis of carotid siphons and vertebral arteries. No hyperdense vessel identified. Skull: Normal. Negative for fracture or focal lesion. Sinuses/Orbits: Mucosal thickening of the right maxillary sinus and right anterior ethmoid air cells. Chronic inflammatory changes of the walls of the right maxillary sinus. Opacification of bilateral mastoid air cells. Orbits are unremarkable. Other: None. ASPECTS [facility] Stroke Program Early CT Score) - Ganglionic level infarction (caudate, lentiform nuclei, internal capsule, insula, M1-M3 cortex): 7 - Supraganglionic infarction (M4-M6 cortex): 3 Total score (0-10 with 10 being normal): 10 IMPRESSION: 1. Interval small age-indeterminate infarcts within the right caudate head and left cerebellar hemisphere. 2. Otherwise no large acute stroke, hemorrhage, or mass effect identified. ASPECTS is 10. 3. Stable chronic microvascular ischemic changes and parenchymal volume loss of the brain. These results were called by telephone at the time of interpretation on 05/01/2019 at 2:06 am to Dr. Leonel Ramsay, who verbally acknowledged these results. Electronically Signed   By: Kristine Garbe M.D.   On: 05/01/2019 02:06    Pending Labs Unresulted Labs (From admission, onward)    Start     Ordered   05/02/19 0000  Vitamin B12  (Anemia Panel (PNL))  Once,   R     05/01/19 0321   05/02/19 0000  Folate  (Anemia Panel (PNL))  Once,   R     05/01/19 0321   05/02/19 0000  Iron and TIBC  (Anemia Panel (PNL))  Once,   R     05/01/19 0321   05/02/19 0000  Ferritin  (Anemia Panel (PNL))  Once,   R     05/01/19 0321   05/02/19 0000  Reticulocytes  (Anemia Panel (PNL))  Once,   R     05/01/19 0321   05/01/19 0500  Hemoglobin A1c  Tomorrow morning,   R     05/01/19 0324   05/01/19 0500  Lipid panel  Tomorrow morning,   R    Comments:  Fasting    05/01/19 0324          Vitals/Pain Today's  Vitals   05/01/19 0236  BP: (!) 114/47  Pulse: 87  Resp: 18  Temp: 97.6 F (36.4 C)  TempSrc: Oral  SpO2: 99%    Isolation Precautions No active isolations  Medications Medications  sodium chloride flush (NS) 0.9 % injection 3 mL (has no administration in time range)  potassium chloride SA (K-DUR) CR tablet 40 mEq (has no administration in time range)  atorvastatin (LIPITOR) tablet 80 mg (has no administration in time range)  hydrALAZINE (APRESOLINE) injection 5 mg (has no administration in time  range)   stroke: mapping our early stages of recovery book (has no administration in time range)  0.9 %  sodium chloride infusion (has no administration in time range)  acetaminophen (TYLENOL) tablet 650 mg (has no administration in time range)    Or  acetaminophen (TYLENOL) solution 650 mg (has no administration in time range)    Or  acetaminophen (TYLENOL) suppository 650 mg (has no administration in time range)  senna-docusate (Senokot-S) tablet 1 tablet (has no administration in time range)  enoxaparin (LOVENOX) injection 40 mg (has no administration in time range)  aspirin suppository 300 mg (has no administration in time range)    Or  aspirin tablet 325 mg (has no administration in time range)  ondansetron (ZOFRAN) injection 4 mg (has no administration in time range)  oxyCODONE (Oxy IR/ROXICODONE) immediate release tablet 5 mg (has no administration in time range)  DULoxetine (CYMBALTA) DR capsule 30 mg (has no administration in time range)  docusate sodium (COLACE) capsule 250 mg (has no administration in time range)  vitamin B-12 (CYANOCOBALAMIN) tablet 1,000 mcg (has no administration in time range)  gabapentin (NEURONTIN) capsule 100-300 mg (has no administration in time range)  albuterol (PROVENTIL) (2.5 MG/3ML) 0.083% nebulizer solution 2.5 mg (has no administration in time range)  ipratropium (ATROVENT HFA) inhaler 2 puff (has no administration in time range)   dextromethorphan-guaiFENesin (MUCINEX DM) 30-600 MG per 12 hr tablet 1 tablet (has no administration in time range)  iohexol (OMNIPAQUE) 350 MG/ML injection 115 mL (115 mLs Intravenous Contrast Given 05/01/19 0206)    Mobility non-ambulatory     Focused Assessments Cardiac Assessment Handoff:    Lab Results  Component Value Date   CKTOTAL 332 (H) 08/30/2018   TROPONINI <0.03 06/09/2018   No results found for: DDIMER Does the Patient currently have chest pain? No  , Neuro Assessment Handoff:  Swallow screen pass? No    NIH Stroke Scale ( + Modified Stroke Scale Criteria)  Interval: Initial Level of Consciousness (1a.)   : Alert, keenly responsive LOC Questions (1b. )   +: Answers one question correctly LOC Commands (1c. )   + : Performs both tasks correctly Best Gaze (2. )  +: Normal Visual (3. )  +: No visual loss Facial Palsy (4. )    : Partial paralysis  Motor Arm, Left (5a. )   +: No movement Motor Arm, Right (5b. )   +: Drift Motor Leg, Left (6a. )   +: No effort against gravity Motor Leg, Right (6b. )   +: Drift Limb Ataxia (7. ): Absent Sensory (8. )   +: Normal, no sensory loss Best Language (9. )   +: No aphasia Dysarthria (10. ): Mild-to-moderate dysarthria, patient slurs at least some words and, at worst, can be understood with some difficulty Extinction/Inattention (11.)   +: No Abnormality Modified SS Total  +: 10 Complete NIHSS TOTAL: 13 Last date known well: 04/30/19 Last time known well: 1700 Neuro Assessment:   Neuro Checks:   Initial (05/01/19 0225)  Last Documented NIHSS Modified Score: 10 (05/01/19 0225) Has TPA been given? No If patient is a Neuro Trauma and patient is going to OR before floor call report to [place] nurse: [phone] or [phone]     R Recommendations: See Admitting Provider Note  Report given to:   Additional Notes: Pt in MRI and will be coming after MRI finished.

## 2019-05-02 ENCOUNTER — Inpatient Hospital Stay (HOSPITAL_COMMUNITY)
Admission: RE | Admit: 2019-05-02 | Discharge: 2019-05-26 | DRG: 056 | Disposition: A | Discharge status: Home Health | Payer: Medicare Other | Source: Intra-hospital | Attending: Physical Medicine & Rehabilitation | Admitting: Physical Medicine & Rehabilitation

## 2019-05-02 ENCOUNTER — Encounter (HOSPITAL_COMMUNITY): Payer: Self-pay | Admitting: *Deleted

## 2019-05-02 ENCOUNTER — Encounter (HOSPITAL_COMMUNITY): Payer: Self-pay | Admitting: Physical Medicine and Rehabilitation

## 2019-05-02 ENCOUNTER — Other Ambulatory Visit: Payer: Self-pay

## 2019-05-02 DIAGNOSIS — I6350 Cerebral infarction due to unspecified occlusion or stenosis of unspecified cerebral artery: Secondary | ICD-10-CM | POA: Diagnosis not present

## 2019-05-02 DIAGNOSIS — Z1159 Encounter for screening for other viral diseases: Secondary | ICD-10-CM | POA: Diagnosis not present

## 2019-05-02 DIAGNOSIS — K529 Noninfective gastroenteritis and colitis, unspecified: Secondary | ICD-10-CM | POA: Diagnosis present

## 2019-05-02 DIAGNOSIS — M19039 Primary osteoarthritis, unspecified wrist: Secondary | ICD-10-CM

## 2019-05-02 DIAGNOSIS — D638 Anemia in other chronic diseases classified elsewhere: Secondary | ICD-10-CM | POA: Diagnosis present

## 2019-05-02 DIAGNOSIS — R410 Disorientation, unspecified: Secondary | ICD-10-CM | POA: Diagnosis not present

## 2019-05-02 DIAGNOSIS — D62 Acute posthemorrhagic anemia: Secondary | ICD-10-CM | POA: Diagnosis present

## 2019-05-02 DIAGNOSIS — E785 Hyperlipidemia, unspecified: Secondary | ICD-10-CM | POA: Diagnosis present

## 2019-05-02 DIAGNOSIS — Z9981 Dependence on supplemental oxygen: Secondary | ICD-10-CM | POA: Diagnosis not present

## 2019-05-02 DIAGNOSIS — Z888 Allergy status to other drugs, medicaments and biological substances status: Secondary | ICD-10-CM

## 2019-05-02 DIAGNOSIS — J9611 Chronic respiratory failure with hypoxia: Secondary | ICD-10-CM | POA: Diagnosis present

## 2019-05-02 DIAGNOSIS — Z87891 Personal history of nicotine dependence: Secondary | ICD-10-CM

## 2019-05-02 DIAGNOSIS — I69398 Other sequelae of cerebral infarction: Secondary | ICD-10-CM

## 2019-05-02 DIAGNOSIS — Z79891 Long term (current) use of opiate analgesic: Secondary | ICD-10-CM

## 2019-05-02 DIAGNOSIS — R4189 Other symptoms and signs involving cognitive functions and awareness: Secondary | ICD-10-CM | POA: Diagnosis present

## 2019-05-02 DIAGNOSIS — I69354 Hemiplegia and hemiparesis following cerebral infarction affecting left non-dominant side: Secondary | ICD-10-CM | POA: Diagnosis present

## 2019-05-02 DIAGNOSIS — I10 Essential (primary) hypertension: Secondary | ICD-10-CM | POA: Diagnosis present

## 2019-05-02 DIAGNOSIS — I69322 Dysarthria following cerebral infarction: Secondary | ICD-10-CM | POA: Diagnosis not present

## 2019-05-02 DIAGNOSIS — R52 Pain, unspecified: Secondary | ICD-10-CM | POA: Diagnosis not present

## 2019-05-02 DIAGNOSIS — F321 Major depressive disorder, single episode, moderate: Secondary | ICD-10-CM

## 2019-05-02 DIAGNOSIS — J9811 Atelectasis: Secondary | ICD-10-CM | POA: Diagnosis present

## 2019-05-02 DIAGNOSIS — N170 Acute kidney failure with tubular necrosis: Secondary | ICD-10-CM

## 2019-05-02 DIAGNOSIS — Z79899 Other long term (current) drug therapy: Secondary | ICD-10-CM

## 2019-05-02 DIAGNOSIS — D509 Iron deficiency anemia, unspecified: Secondary | ICD-10-CM | POA: Diagnosis present

## 2019-05-02 DIAGNOSIS — D649 Anemia, unspecified: Secondary | ICD-10-CM

## 2019-05-02 DIAGNOSIS — G8929 Other chronic pain: Secondary | ICD-10-CM | POA: Diagnosis present

## 2019-05-02 DIAGNOSIS — I69328 Other speech and language deficits following cerebral infarction: Secondary | ICD-10-CM

## 2019-05-02 DIAGNOSIS — K2211 Ulcer of esophagus with bleeding: Secondary | ICD-10-CM | POA: Diagnosis present

## 2019-05-02 DIAGNOSIS — M810 Age-related osteoporosis without current pathological fracture: Secondary | ICD-10-CM | POA: Diagnosis present

## 2019-05-02 DIAGNOSIS — K2960 Other gastritis without bleeding: Secondary | ICD-10-CM | POA: Diagnosis present

## 2019-05-02 DIAGNOSIS — E538 Deficiency of other specified B group vitamins: Secondary | ICD-10-CM | POA: Diagnosis present

## 2019-05-02 DIAGNOSIS — F329 Major depressive disorder, single episode, unspecified: Secondary | ICD-10-CM | POA: Diagnosis present

## 2019-05-02 DIAGNOSIS — M549 Dorsalgia, unspecified: Secondary | ICD-10-CM | POA: Diagnosis present

## 2019-05-02 DIAGNOSIS — I6522 Occlusion and stenosis of left carotid artery: Secondary | ICD-10-CM | POA: Diagnosis present

## 2019-05-02 DIAGNOSIS — D508 Other iron deficiency anemias: Secondary | ICD-10-CM | POA: Diagnosis not present

## 2019-05-02 DIAGNOSIS — I69318 Other symptoms and signs involving cognitive functions following cerebral infarction: Secondary | ICD-10-CM

## 2019-05-02 DIAGNOSIS — K222 Esophageal obstruction: Secondary | ICD-10-CM | POA: Diagnosis present

## 2019-05-02 DIAGNOSIS — M545 Low back pain: Secondary | ICD-10-CM

## 2019-05-02 DIAGNOSIS — M659 Synovitis and tenosynovitis, unspecified: Secondary | ICD-10-CM | POA: Diagnosis present

## 2019-05-02 DIAGNOSIS — M19031 Primary osteoarthritis, right wrist: Secondary | ICD-10-CM | POA: Diagnosis present

## 2019-05-02 DIAGNOSIS — J449 Chronic obstructive pulmonary disease, unspecified: Secondary | ICD-10-CM | POA: Diagnosis present

## 2019-05-02 DIAGNOSIS — R079 Chest pain, unspecified: Secondary | ICD-10-CM

## 2019-05-02 DIAGNOSIS — N179 Acute kidney failure, unspecified: Secondary | ICD-10-CM | POA: Diagnosis present

## 2019-05-02 DIAGNOSIS — R609 Edema, unspecified: Secondary | ICD-10-CM | POA: Diagnosis not present

## 2019-05-02 DIAGNOSIS — J9610 Chronic respiratory failure, unspecified whether with hypoxia or hypercapnia: Secondary | ICD-10-CM

## 2019-05-02 DIAGNOSIS — Z90710 Acquired absence of both cervix and uterus: Secondary | ICD-10-CM

## 2019-05-02 DIAGNOSIS — K449 Diaphragmatic hernia without obstruction or gangrene: Secondary | ICD-10-CM | POA: Diagnosis present

## 2019-05-02 DIAGNOSIS — J410 Simple chronic bronchitis: Secondary | ICD-10-CM

## 2019-05-02 DIAGNOSIS — Z833 Family history of diabetes mellitus: Secondary | ICD-10-CM

## 2019-05-02 DIAGNOSIS — T39015A Adverse effect of aspirin, initial encounter: Secondary | ICD-10-CM | POA: Diagnosis present

## 2019-05-02 DIAGNOSIS — Z8249 Family history of ischemic heart disease and other diseases of the circulatory system: Secondary | ICD-10-CM

## 2019-05-02 LAB — IRON AND TIBC
Iron: 21 ug/dL — ABNORMAL LOW (ref 28–170)
Saturation Ratios: 7 % — ABNORMAL LOW (ref 10.4–31.8)
TIBC: 308 ug/dL (ref 250–450)
UIBC: 287 ug/dL

## 2019-05-02 LAB — RETICULOCYTES
Immature Retic Fract: 18.8 % — ABNORMAL HIGH (ref 2.3–15.9)
RBC.: 3.05 MIL/uL — ABNORMAL LOW (ref 3.87–5.11)
Retic Count, Absolute: 64.4 10*3/uL (ref 19.0–186.0)
Retic Ct Pct: 2.1 % (ref 0.4–3.1)

## 2019-05-02 LAB — FOLATE: Folate: 10.6 ng/mL (ref >5.9)

## 2019-05-02 LAB — FERRITIN: Ferritin: 19 ng/mL (ref 11–307)

## 2019-05-02 LAB — VITAMIN B12: Vitamin B-12: 224 pg/mL (ref 180–914)

## 2019-05-02 MED ORDER — POLYETHYLENE GLYCOL 3350 17 G PO PACK
17.0000 g | PACK | Freq: Every day | ORAL | Status: DC | PRN
  Administered 2019-05-12 – 2019-05-13 (×2): 17 g via ORAL
  Filled 2019-05-02 (×2): qty 1

## 2019-05-02 MED ORDER — FLEET ENEMA 7-19 GM/118ML RE ENEM: 1.0000 | ENEMA | Freq: Once | RECTAL | Status: DC | PRN

## 2019-05-02 MED ORDER — ASPIRIN EC 81 MG PO TBEC
81.0000 mg | DELAYED_RELEASE_TABLET | Freq: Every day | ORAL | Status: DC
  Administered 2019-05-03 – 2019-05-26 (×24): 81 mg via ORAL
  Filled 2019-05-02 (×24): qty 1

## 2019-05-02 MED ORDER — PROCHLORPERAZINE EDISYLATE 10 MG/2ML IJ SOLN: 5.0000 mg | Freq: Four times a day (QID) | INTRAMUSCULAR | Status: DC | PRN

## 2019-05-02 MED ORDER — TRAZODONE HCL 50 MG PO TABS
25.0000 mg | ORAL_TABLET | Freq: Every evening | ORAL | Status: DC | PRN
  Administered 2019-05-03 – 2019-05-22 (×17): 50 mg via ORAL
  Filled 2019-05-02 (×23): qty 1

## 2019-05-02 MED ORDER — TRAMADOL HCL 50 MG PO TABS
50.0000 mg | ORAL_TABLET | Freq: Four times a day (QID) | ORAL | Status: DC | PRN
  Administered 2019-05-04 – 2019-05-22 (×20): 50 mg via ORAL
  Filled 2019-05-02 (×22): qty 1

## 2019-05-02 MED ORDER — IPRATROPIUM-ALBUTEROL 0.5-2.5 (3) MG/3ML IN SOLN: 3.0000 mL | RESPIRATORY_TRACT | Status: DC | PRN

## 2019-05-02 MED ORDER — ALUM & MAG HYDROXIDE-SIMETH 200-200-20 MG/5ML PO SUSP: 30.0000 mL | ORAL | Status: DC | PRN

## 2019-05-02 MED ORDER — ENOXAPARIN SODIUM 40 MG/0.4ML ~~LOC~~ SOLN
40.0000 mg | SUBCUTANEOUS | Status: DC
  Administered 2019-05-03 – 2019-05-21 (×19): 40 mg via SUBCUTANEOUS
  Filled 2019-05-02 (×20): qty 0.4

## 2019-05-02 MED ORDER — ASPIRIN 81 MG PO TBEC: 81.0000 mg | DELAYED_RELEASE_TABLET | Freq: Every day | ORAL | Status: DC

## 2019-05-02 MED ORDER — DULOXETINE HCL 60 MG PO CPEP
60.0000 mg | ORAL_CAPSULE | Freq: Every day | ORAL | Status: DC
  Administered 2019-05-03 – 2019-05-26 (×24): 60 mg via ORAL
  Filled 2019-05-02 (×24): qty 1

## 2019-05-02 MED ORDER — GUAIFENESIN-DM 100-10 MG/5ML PO SYRP: 5.0000 mL | ORAL_SOLUTION | Freq: Four times a day (QID) | ORAL | Status: DC | PRN

## 2019-05-02 MED ORDER — BISACODYL 10 MG RE SUPP
10.0000 mg | Freq: Every day | RECTAL | Status: DC | PRN
  Administered 2019-05-12 – 2019-05-21 (×3): 10 mg via RECTAL
  Filled 2019-05-02 (×3): qty 1

## 2019-05-02 MED ORDER — OXYCODONE HCL 5 MG PO TABS
5.0000 mg | ORAL_TABLET | Freq: Two times a day (BID) | ORAL | Status: DC | PRN
  Administered 2019-05-03 – 2019-05-20 (×23): 5 mg via ORAL
  Filled 2019-05-02 (×24): qty 1

## 2019-05-02 MED ORDER — PROCHLORPERAZINE 25 MG RE SUPP: 12.5000 mg | Freq: Four times a day (QID) | RECTAL | Status: DC | PRN

## 2019-05-02 MED ORDER — POTASSIUM CHLORIDE CRYS ER 20 MEQ PO TBCR: 40.0000 meq | EXTENDED_RELEASE_TABLET | Freq: Once | ORAL | Status: DC

## 2019-05-02 MED ORDER — ATORVASTATIN CALCIUM 20 MG PO TABS: 20.0000 mg | ORAL_TABLET | Freq: Every day | ORAL | 0 refills | Status: DC

## 2019-05-02 MED ORDER — DM-GUAIFENESIN ER 30-600 MG PO TB12
1.0000 | ORAL_TABLET | Freq: Two times a day (BID) | ORAL | Status: DC | PRN
  Administered 2019-05-12: 1 via ORAL
  Filled 2019-05-02: qty 1

## 2019-05-02 MED ORDER — VITAMIN B-12 1000 MCG PO TABS
1000.0000 ug | ORAL_TABLET | Freq: Every day | ORAL | Status: DC
  Administered 2019-05-03 – 2019-05-26 (×24): 1000 ug via ORAL
  Filled 2019-05-02 (×24): qty 1

## 2019-05-02 MED ORDER — PROCHLORPERAZINE MALEATE 5 MG PO TABS: 5.0000 mg | ORAL_TABLET | Freq: Four times a day (QID) | ORAL | Status: DC | PRN

## 2019-05-02 MED ORDER — CLOPIDOGREL BISULFATE 75 MG PO TABS: 75.0000 mg | ORAL_TABLET | Freq: Every day | ORAL | 0 refills | Status: DC

## 2019-05-02 MED ORDER — ALBUTEROL SULFATE (2.5 MG/3ML) 0.083% IN NEBU: 2.5000 mg | INHALATION_SOLUTION | RESPIRATORY_TRACT | Status: DC | PRN

## 2019-05-02 MED ORDER — DOCUSATE SODIUM 50 MG PO CAPS
250.0000 mg | ORAL_CAPSULE | Freq: Every day | ORAL | Status: DC
  Administered 2019-05-03 – 2019-05-05 (×3): 250 mg via ORAL
  Administered 2019-05-06: 100 mg via ORAL
  Administered 2019-05-07 – 2019-05-26 (×20): 250 mg via ORAL
  Filled 2019-05-02 (×25): qty 1

## 2019-05-02 MED ORDER — DIPHENHYDRAMINE HCL 12.5 MG/5ML PO ELIX: 12.5000 mg | ORAL_SOLUTION | Freq: Four times a day (QID) | ORAL | Status: DC | PRN

## 2019-05-02 MED ORDER — ATORVASTATIN CALCIUM 10 MG PO TABS
20.0000 mg | ORAL_TABLET | Freq: Every day | ORAL | Status: DC
  Administered 2019-05-03 – 2019-05-26 (×24): 20 mg via ORAL
  Filled 2019-05-02 (×24): qty 2

## 2019-05-02 MED ORDER — LIDOCAINE 5 % EX PTCH
1.0000 | MEDICATED_PATCH | CUTANEOUS | Status: DC
  Administered 2019-05-03 – 2019-05-26 (×23): 1 via TRANSDERMAL
  Filled 2019-05-02 (×24): qty 1

## 2019-05-02 MED ORDER — ACETAMINOPHEN 325 MG PO TABS
325.0000 mg | ORAL_TABLET | ORAL | Status: DC | PRN
  Administered 2019-05-16 – 2019-05-18 (×4): 650 mg via ORAL
  Filled 2019-05-02 (×6): qty 2

## 2019-05-02 MED ORDER — CLOPIDOGREL BISULFATE 75 MG PO TABS
75.0000 mg | ORAL_TABLET | Freq: Every day | ORAL | Status: AC
  Administered 2019-05-03 – 2019-05-15 (×13): 75 mg via ORAL
  Filled 2019-05-02 (×13): qty 1

## 2019-05-02 MED ORDER — GABAPENTIN 100 MG PO CAPS: 100.0000 mg | ORAL_CAPSULE | Freq: Every evening | ORAL | Status: DC | PRN

## 2019-05-02 NOTE — PMR Pre-admission (Signed)
PMR Admission Coordinator Pre-Admission Assessment  Patient: [name] is an 83 y.o., female MRN: [id number] DOB: 04/21/1936 Height: _0  (144.8 cm) Weight: 60.8 kg  Insurance Information HMO: yes    PPO:      PCP:      IPA:      80/20:      OTHER: medicare advantage plan PRIMARY: UHC Medicare      Policy#: [id number]      Subscriber: pt CM Name: Covid automated approval      Phone#: [phone]     Fax#: [phone] Pre-Cert#: [id number]  F/u with Dorthula Nettles in 3 days    Employer:  Benefits:  Phone #: [phone]     Name:  Eff. Date: 04/28/2019     Deduct: none      Out of Pocket Max: $3600      Life Max: none CIR: $295 co pay per day days 1 until 5      SNF: no co pay per day days 1 until 20; $160 co pay per day days 21 until 43; no co pay days 44 until 100 Outpatient: $30 per visit     Co-Pay: visits per medical neccesity Home Health: 100%      Co-Pay: visits per medical neccesity DME: 80%     Co-Pay: 20% Providers: in network  SECONDARY: none        Medicaid Application Date:       Case Manager:  Disability Application Date:       Case Worker:   The "Data Collection Information Summary" for patients in Inpatient Rehabilitation Facilities with attached "Privacy Act [place] Records" was provided and verbally reviewed with: Patient and Family  Emergency Contact Information Contact Information    Name Relation Home Work [place] Son [phone]     Paulino Door [phone]        Current Medical History  Patient Admitting Diagnosis: right pontine infarct  History of Present Illness:[name] is an 83 year old female with history of COPD- oxygen dependent, HTN, chronic back pain who was admitted on 05/01/19 with slurred speech, left sided weakness and difficulty walking. 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 MRI  brain showed acute right pontine infarct. 2D echo with bubble study showed EF 60-65% with normal systolic function and  Dr. Erlinda Hong felt that stroke was due to small vessel disease and recommended DAPT followed by ASA alone and outpatient follow up with [facility] for monitoring of asymptomatic L-ICA stenosis.  Patient with resultant left sided weakness with poor postural control at EOB, mild cognitive deficits and moderate flaccid dysarthria affecting mobility and self care.   Complete NIHSS TOTAL: 10  Patient's medical record from [facility]  has been reviewed by the rehabilitation admission coordinator and physician.  Past Medical History  Past Medical History:  Diagnosis Date  . Chronic back pain   . Chronic back pain 08/22/2018  . Colitis 03/19/2014  . COPD (chronic obstructive pulmonary disease) ([place])   . Hypertension   . On home oxygen therapy 06-14-13   continuos 2.5l/m nasally-24/7  . Osteoporosis   . Oxygen dependent     Family History   family history includes Diabetes Mellitus II in her sister; Hypertension in her sister.  Prior Rehab/Hospitalizations Has the patient had prior rehab or hospitalizations prior to admission? Yes  Has the patient had major surgery during  100 days prior to admission? No   Current Medications  Current Facility-Administered Medications:  .  0.9 %  sodium chloride infusion, , Intravenous, Continuous, Rosalin Hawking, MD, Last Rate: 50 mL/hr at 05/01/19 1752 .  acetaminophen (TYLENOL) tablet 650 mg, 650 mg, Oral, Q4H PRN, 650 mg at 05/02/19 1428 **OR** acetaminophen (TYLENOL) solution 650 mg, 650 mg, Per Tube, Q4H PRN **OR** acetaminophen (TYLENOL) suppository 650 mg, 650 mg, Rectal, Q4H PRN, Ivor Costa, MD .  albuterol (PROVENTIL) (2.5 MG/3ML) 0.083% nebulizer solution 2.5 mg, 2.5 mg, Nebulization, Q4H PRN, Ivor Costa, MD .  aspirin EC tablet 81 mg, 81 mg, Oral, Daily, Rosalin Hawking, MD, 81 mg at 05/02/19 1043 .  atorvastatin (LIPITOR) tablet 20 mg, 20  mg, Oral, q1800, Rosalin Hawking, MD, 20 mg at 05/01/19 1633 .  clopidogrel (PLAVIX) tablet 75 mg, 75 mg, Oral, Daily, Rosalin Hawking, MD, 75 mg at 05/02/19 1043 .  dextromethorphan-guaiFENesin (MUCINEX DM) 30-600 MG per 12 hr tablet 1 tablet, 1 tablet, Oral, BID PRN, Ivor Costa, MD .  docusate sodium (COLACE) capsule 250 mg, 250 mg, Oral, Daily, Ivor Costa, MD, 250 mg at 05/01/19 0826 .  DULoxetine (CYMBALTA) DR capsule 60 mg, 60 mg, Oral, Daily, Ivor Costa, MD, 60 mg at 05/02/19 1043 .  enoxaparin (LOVENOX) injection 40 mg, 40 mg, Subcutaneous, Q24H, Ivor Costa, MD, 40 mg at 05/02/19 0602 .  gabapentin (NEURONTIN) capsule 100-300 mg, 100-300 mg, Oral, QHS PRN, Ivor Costa, MD, 100 mg at 05/01/19 2129 .  hydrALAZINE (APRESOLINE) injection 5 mg, 5 mg, Intravenous, Q2H PRN, Ivor Costa, MD, 5 mg at 05/01/19 0847 .  ondansetron (ZOFRAN) injection 4 mg, 4 mg, Intravenous, Q8H PRN, Ivor Costa, MD .  oxyCODONE (Oxy IR/ROXICODONE) immediate release tablet 5 mg, 5 mg, Oral, Q4H PRN, Barb Merino, MD, 5 mg at 05/02/19 1043 .  senna-docusate (Senokot-S) tablet 1 tablet, 1 tablet, Oral, QHS PRN, Ivor Costa, MD .  vitamin B-12 (CYANOCOBALAMIN) tablet 1,000 mcg, 1,000 mcg, Oral, Daily, Ivor Costa, MD, 1,000 mcg at 05/02/19 1043  Patients Current Diet:  Diet Order            Diet Heart Room service appropriate? No; Fluid consistency: Thin  Diet effective now              Precautions / Restrictions Precautions Precautions: Fall Restrictions Weight Bearing Restrictions: No   Has the patient had 2 or more falls or a fall with injury in the past year? No  Prior Activity Level Limited Community (1-2x/wk): decline in fucntion over past few weeks. Was mod I RW priro to that time  Prior Functional Level Self Care: Did the patient need help bathing, dressing, using the toilet or eating? Needed some help  Indoor Mobility: Did the patient need assistance with walking from room to room (with or without device)?  Needed some help  Stairs: Did the patient need assistance with internal or external stairs (with or without device)? Needed some help  Functional Cognition: Did the patient need help planning regular tasks such as shopping or remembering to take medications? Needed some help  Home Assistive Devices / [place] Devices/Equipment: Wheelchair Home Equipment: Environmental consultant - 2 wheels, [place] - single point, Bedside commode, Walker - 4 wheels  Prior Device Use: Indicate devices/aids used by the patient prior to current illness, exacerbation or injury? Walker  Current Functional Level Cognition  Arousal/Alertness: Awake/alert Overall Cognitive Status: Impaired/Different from baseline Current Attention Level: Sustained Orientation Level: Oriented to place, Oriented to person,  Oriented to situation Following Commands: Follows one step commands inconsistently, Follows one step commands with increased time Safety/Judgement: Decreased awareness of safety, Decreased awareness of deficits General Comments: Asked multiple times "What's wrong with me?" and required redirection to situation. Poor awareness of safety, thinking she could walk after it took 2 people to transfer to the chair only, Attention: Selective, Alternating Selective Attention: Appears intact Alternating Attention: Impaired Alternating Attention Impairment: Verbal complex Memory: Impaired Memory Impairment: Storage deficit, Decreased recall of new information, Retrieval deficit Awareness: Impaired Awareness Impairment: Emergent impairment Problem Solving: Appears intact Executive Function: Organizing, Self Monitoring Organizing: Impaired Organizing Impairment: Verbal complex Self Monitoring: Impaired Self Monitoring Impairment: Verbal complex, Functional basic Behaviors: Restless Safety/Judgment: Impaired    Extremity Assessment (includes Sensation/Coordination)  Upper Extremity Assessment: RUE deficits/detail, LUE  deficits/detail RUE Deficits / Details: only tolerating minimal movement at shoulder before reporting pain (reports it was not this way pta) RUE Coordination: decreased fine motor, decreased gross motor LUE Deficits / Details: only tolerating minimal movement at shoulder before reporting pain (reports it was not this way pta); sublux noted, minimal movement noted throughout LUE Coordination: decreased gross motor, decreased fine motor  Lower Extremity Assessment: LLE deficits/detail LLE Deficits / Details: Minimal active movement on eval    ADLs  Overall ADL's : Needs assistance/impaired Eating/Feeding: Total assistance Eating/Feeding Details (indicate cue type and reason): supported sitting--due to pain in Bil shoulders and decreased use of LUE from CVA Grooming: Total assistance Grooming Details (indicate cue type and reason): supported sitting--due to pain in Bil shoulders and decreased use of LUE from CVA Upper Body Bathing: Total assistance Upper Body Bathing Details (indicate cue type and reason): supported sitting--due to pain in Bil shoulders and decreased use of LUE from CVA Lower Body Bathing: Total assistance Lower Body Bathing Details (indicate cue type and reason): Max A +2 sit<>stand Upper Body Dressing : Total assistance Upper Body Dressing Details (indicate cue type and reason): supported sitting--due to pain in Bil shoulders and decreased use of LUE from CVA Lower Body Dressing: Total assistance Lower Body Dressing Details (indicate cue type and reason): Max A +2 sit<>stand Toilet Transfer: Maximal assistance, +2 for physical assistance, Control and instrumentation engineer Details (indicate cue type and reason): bed>recliner going to pt's right Toileting- Clothing Manipulation and Hygiene: Total assistance Toileting - Clothing Manipulation Details (indicate cue type and reason): Max A +2 sit<>stand    Mobility  Overal bed mobility: Needs Assistance Bed Mobility: Supine to  Sit Supine to sit: Max assist, +2 for physical assistance General bed mobility comments: Assist for LE advancement towards EOB, scooting and trunk elevation to full sitting position. Bed pad utilized for scooting assist.     Transfers  Overall transfer level: Needs assistance Equipment used: 2 person hand held assist Transfer via Lift Equipment: Stedy Transfers: Sit to/from Stand Sit to Stand: Max assist, +2 physical assistance Stand pivot transfers: Max assist, +2 physical assistance General transfer comment: +2 assist to power-up to full standing position. Increased multimodal cues to maximize hip extension enough to get Stedy flaps under her.     Ambulation / Gait / Stairs / Wheelchair Mobility  Ambulation/Gait General Gait Details: Pt was able to [employer] activity within [employer]. Weight shifting x10, marching in place x2 each side with therapist assist to stabilize R knee during L march.     Posture / Balance Dynamic Sitting Balance Sitting balance - Comments: Required hands-on assist for sitting balance throughout EOB activity.  Balance Overall balance assessment: Needs  assistance Sitting-balance support: Feet supported, No upper extremity supported Sitting balance-Leahy Scale: Poor Sitting balance - Comments: Required hands-on assist for sitting balance throughout EOB activity.  Postural control: Posterior lean Standing balance support: Bilateral upper extremity supported, During functional activity Standing balance-Leahy Scale: Zero Standing balance comment: +2 required.     Special needs/care consideration BiPAP/CPAP  N/a CPM  N/a Continuous Drip IV  N/a Dialysis n/a Life Vest  N/a Oxygen  2 to 3 liters [place] Special Bed  N/a Trach Size  N/a Wound Vac n/a Skin  intact Bowel mgmt:  Incontinent LBM 5/3 Bladder mgmt: external catheter Diabetic mgmt: Hgb A1c 6.2 Behavioral consideration  N/a Chemo/radiation  N/a   Previous Home Environment  Living Arrangements:  (son, Legrand Como)  Lives With: Leamon Arnt, lives with patient) Available Help at Discharge: Family, Available 24 hours/day Type of Home: House Home Layout: One level Home Access: Stairs to enter Entrance Stairs-Rails: Right Entrance Stairs-Number of Steps: 2 Bathroom Shower/Tub: Tub/shower unit, Architectural technologist: Standard Bathroom Accessibility: Yes How Accessible: Accessible via walker [place]: No Additional Comments: [name] has been asissting with bathing and dressing over past few weeks due to her decline in function  Discharge Living Setting Plans for Discharge Living Setting: Patient's home(son, [name], lives with pt) Type of Home at Discharge: House Discharge Home Layout: One level Discharge Home Access: Stairs to enter Entrance Stairs-Rails: Right Entrance Stairs-Number of Steps: 2 Discharge Bathroom Shower/Tub: Tub/shower unit, Curtain Discharge Bathroom Toilet: Standard Discharge Bathroom Accessibility: Yes How Accessible: Accessible via walker Does the patient have any problems obtaining your medications?: No  Social/Family/Support Systems Patient Roles: Parent Contact Information: [name], son and sister, Hassan Rowan Anticipated Caregiver: [name] Anticipated Caregiver's Contact Information: home number is [phone] cell is not working Ability/Limitations of Caregiver: no limitations Caregiver Availability: 24/7 Discharge Plan Discussed with Primary Caregiver: Yes Is Caregiver In Agreement with Plan?: Yes Does Caregiver/Family have Issues with Lodging/Transportation while Pt is in Rehab?: No  Goals/Additional Needs Patient/Family Goal for Rehab: min assist PT and OT, supervision SLP Expected length of stay: ELOS 14 to 20 days Special Service Needs: Patient has long history of back pain issues Pt/Family Agrees to Admission and willing to participate: Yes Program Orientation Provided & Reviewed with Pt/Caregiver Including Roles  & Responsibilities:  Yes  Decrease burden of Care through IP rehab admission: n/a  Possible need for SNF placement upon discharge: pt refuses  Patient Condition: I have reviewed medical records from [facility], spoken with CSW, and patient, son and family member. I met with patient at the bedside for inpatient rehabilitation assessment.  Patient will benefit from ongoing PT, OT and SLP, can actively participate in 3 hours of therapy a day 5 days of the week, and can make measurable gains during the admission.  Patient will also benefit from the coordinated team approach during an Inpatient Acute Rehabilitation admission.  The patient will receive intensive therapy as well as Rehabilitation physician, nursing, social worker, and care management interventions.  Due to bladder management, bowel management, safety, skin/wound care, disease management, medication administration, pain management and patient education the patient requires 24 hour a day rehabilitation nursing.  The patient is currently max assist with mobility and basic ADLs.  Discharge setting and therapy post discharge at home with home health is anticipated.  Patient has agreed to participate in the Acute Inpatient Rehabilitation Program and will admit today.  Preadmission Screen Completed By:  Cleatrice Burke RN MSN 05/02/2019 2:39 PM ______________________________________________________________________  Discussed status with Dr. Naaman Plummer  on  05/02/2019 at  1452 and received approval for admission today.  Admission Coordinator:  Cleatrice Burke, RN MSN, time  [id number] Date  05/02/2019   Assessment/Plan:  Diagnosis: right pontine infarct 1. Does the need for close, 24 hr/day Medical supervision in concert with the patient's rehab needs make it unreasonable for this patient to be served in a less intensive setting? Yes 2. Co-Morbidities requiring supervision/potential complications: COPD, HTN 3. Due to bladder management, bowel management,  safety, skin/wound care, disease management, medication administration, pain management and patient education, does the patient require 24 hr/day rehab nursing? Yes 4. Does the patient require coordinated care of a physician, rehab nurse, PT (1-2 hrs/day, 5 days/week), OT (1-2 hrs/day, 5 days/week) and SLP (1-2 hrs/day, 5 days/week) to address physical and functional deficits in the context of the above medical diagnosis(es)? Yes Addressing deficits in the following areas: balance, endurance, locomotion, strength, transferring, bowel/bladder control, bathing, dressing, feeding, grooming, toileting, cognition, speech, swallowing and psychosocial support 5. Can the patient actively participate in an intensive therapy program of at least 3 hrs of therapy 5 days a week? Yes 6. The potential for patient to make measurable gains while on inpatient rehab is excellent 7. Anticipated functional outcomes upon discharge from inpatients are: min assist PT, min assist OT, supervision SLP 8. Estimated rehab length of stay to reach the above functional goals is: 14-20 days 9. Anticipated D/C setting: Home 10. Anticipated post D/C treatments: [place] therapy 11. Overall Rehab/Functional Prognosis: excellent  MD Signature: Meredith Staggers, MD, [place] [facility] 05/02/2019

## 2019-05-02 NOTE — Progress Notes (Addendum)
Inpatient Rehabilitation Admissions Coordinator  Inpatient Rehab Consult received. I met with patient at the bedside for rehabilitation assessment. We discussed goals and expectations of an inpatient rehab admission.  She prefers an inpt rehab admission and refuses SNF at this time. I have left a voicemail for her son, [name], to contact me to discuss our rehab venue. I will begin insurance approval with [place] for a possible admission today or tomorrow.  Danne Baxter, RN, MSN Rehab Admissions Coordinator [phone] 05/02/2019 12:24 PM   I spoke with pt's son, [name], by phone and he is in agreement. I also spoke with pt's sister by phone to discuss cost of care and admission to CIR. Pt no win agreement to admit. Her concerns were cost of care. '

## 2019-05-02 NOTE — Discharge Summary (Signed)
Physician Discharge Summary  [name] WOE:[id number] DOB: 03-18-1936 DOA: 05/01/2019  PCP: [name], MD  Admit date: 05/01/2019 Discharge date: 05/02/2019  Time spent: 45 minutes  Recommendations for Outpatient Follow-up:  1. Discharge to inpatient rehab 2. Follow up with [facility] weeks after rehab   Discharge Diagnoses:  Principal Problem:   Stroke [facility]) Active Problems:   Hypokalemia   Hypertension   Acute renal failure superimposed on stage 3 chronic kidney disease (HCC)   Normocytic anemia   COPD (chronic obstructive pulmonary disease) (HCC)   Chronic respiratory failure ([place])   Depression   Discharge Condition: stable  Diet recommendation: heart healthy  Filed Weights   05/01/19 0435  Weight: 60.8 kg    History of present illness:  [name] is a 83 y.o. female with medical history significant of hypertension, COPD on 2.5 L oxygen at home, depression, chronic back pain, anemia, CKD 3, who presented 5/4 with left-sided weakness and  slurred speech. Per report, pt was noted to have left-sided weakness, and slurred speech at about 5 PM. She normally can walk by herself but unable to on admission. No vision loss or hearing loss.  Patient has mild cough, no worsening shortness of breath.  Denied chest pain, fever or chills.  No nausea, vomiting, diarrhea, abdominal pain, symptoms of UTI. She stated that has chronic left knee joint and back pain.  Hospital Course:  Stroke (HCC):CT-head showed interval small age-indeterminate infarcts within the right caudate head and left cerebellar hemisphere.CTAof head and neck and cerebral perfusionrevealed no LVO or acute stroke, but showed left proximal ICA moderate 50-70% stenosis with mixed plaque. MRI right pontine infarct secondary to small vessel disease. Echo with EF 60%, LDL 106, HgA1c 6.2 . Plan is to continue plavix and 81mg  asa for 3 weeks then asa alone. Continue statin  Normocytic anemia: Hgb  11.7 on 09/03/18-->8.4 on admission. Anemia panel with iron 21 and RBC 3.05 and immature retic 18.8 otherwise within limits of normal   Hypokalemia:K=3.4 will -repleated   Hypertension: fair control.  - continue to holdamlodipineto allow permissive HTN in the setting of acute stroke  COPDand dhronic respiratory failure: appears stable at baseline. Oxygen saturation level greater 90% on room air.  -Atrovent inhaler and prn albuterolnebs, prn mucinex -Continue nasal cannula oxygen to maintain oxygen saturation above 93%  Depression: appears anxious and somewhat irritable today -continue homeCymbalta  Acute renal failure superimposed on stage 3 chronic kidney disease (HCC):Baseline Cre is<1.0, pt's Cre is 1.30 and BUN 43on admission.   Procedures:  echo  Consultations:  Xu, neurology  Discharge Exam: Vitals:   05/02/19 0323 05/02/19 0829  BP: (!) 142/68 140/73  Pulse: 83 96  Resp: 12   Temp: 99.1 F (37.3 C) 98.9 F (37.2 C)  SpO2: 99% 100%    General: awake alert oriented anxioius Cardiovascular: rrr no mgr no LE edema Respiratory: normal effort BS clear   Neuro: oriented x3. Left facial droop, moderate dysarthria, left grip 1/5 and right grip 4/5 LLE strength 1/5 RLE strenght 4/5 Discharge Instructions   Discharge Instructions    Ambulatory referral to Neurology   Complete by:  As directed    Follow up with stroke clinic NP (Jessica Vanschaick or Cecille Rubin, if both not available, consider Zachery Dauer, or Ahern) at [facility] in about 4 weeks. Thanks.   Ambulatory referral to Vascular Surgery   Complete by:  As directed    Left ICA stenosis, asymptomatic  Allergies as of 05/02/2019      Reactions   Ambien [zolpidem] Other (See Comments)   Hallucinations and paranoid      Medication List    STOP taking these medications   oxyCODONE-acetaminophen 5-325 MG tablet Commonly known as:  PERCOCET/ROXICET   polyethylene glycol 17 g  packet Commonly known as:  MIRALAX / GLYCOLAX     TAKE these medications   acetaminophen 500 MG tablet Commonly known as:  TYLENOL Take 1 tablet (500 mg total) by mouth 3 (three) times daily.   albuterol 108 (90 Base) MCG/ACT inhaler Commonly known as:  VENTOLIN HFA Inhale 1-2 puffs into the lungs every 4 (four) hours as needed for wheezing or shortness of breath.   amLODipine 5 MG tablet Commonly known as:  NORVASC Take 1 tablet (5 mg total) by mouth daily.   aspirin 81 MG EC tablet Take 1 tablet (81 mg total) by mouth daily. Start taking on:  May 03, 2019   atorvastatin 20 MG tablet Commonly known as:  LIPITOR Take 1 tablet (20 mg total) by mouth daily at 6 PM.   clopidogrel 75 MG tablet Commonly known as:  PLAVIX Take 1 tablet (75 mg total) by mouth daily for 21 days. Start taking on:  May 03, 2019   cyanocobalamin 1000 MCG tablet Take 1 tablet (1,000 mcg total) by mouth daily.   diclofenac sodium 1 % Gel Commonly known as:  VOLTAREN Apply 2 g topically 4 (four) times daily.   docusate sodium 250 MG capsule Commonly known as:  COLACE Take 1 capsule (250 mg total) by mouth daily.   DULoxetine 30 MG capsule Commonly known as:  CYMBALTA Take 30-60 capsules by mouth See admin instructions. 1 capsule daily for 1 week, increase to 2 capsules therafter   feeding supplement (ENSURE ENLIVE) Liqd Take 237 mLs by mouth 2 (two) times daily between meals.   gabapentin 100 MG capsule Commonly known as:  NEURONTIN Take 100-300 mg by mouth at bedtime as needed for pain.   lidocaine 5 % Commonly known as:  LIDODERM Place 1 patch onto the skin daily. Remove & Discard patch within 12 hours or as directed by MD   oxyCODONE 5 MG immediate release tablet Commonly known as:  Oxy IR/ROXICODONE Take 1 tablet (5 mg total) by mouth every 6 (six) hours as needed for breakthrough pain.   senna 8.6 MG Tabs tablet Commonly known as:  SENOKOT Take 1 tablet (8.6 mg total) by mouth 2  (two) times daily.   SYRINGE 3CC/22GX3/4" 22G X 3/4" 3 ML Misc 1,000 mcg by Does not apply route daily.      Allergies  Allergen Reactions  . Ambien [Zolpidem] Other (See Comments)    Hallucinations and paranoid   Follow-up Information    [facility]. Schedule an appointment as soon as possible for a visit in 4 week(s).   Specialty:  Neurology Contact information: [address] [place] [phone]           The results of significant diagnostics from this hospitalization (including imaging, microbiology, ancillary and laboratory) are listed below for reference.    Significant Diagnostic Studies: Ct Angio Head W Or Wo Contrast  Result Date: 05/01/2019 CLINICAL DATA:  83 y/o  F; left-sided weakness and slurred speech. EXAM: CT ANGIOGRAPHY HEAD AND NECK CT PERFUSION BRAIN TECHNIQUE: Multidetector CT imaging of the head and neck was performed using the standard protocol during bolus administration of intravenous contrast. Multiplanar  CT image reconstructions and MIPs were obtained to evaluate the vascular anatomy. Carotid stenosis measurements (when applicable) are obtained utilizing NASCET criteria, using the distal internal carotid diameter as the denominator. Multiphase CT imaging of the brain was performed following IV bolus contrast injection. Subsequent parametric perfusion maps were calculated using RAPID software. CONTRAST:  181mL OMNIPAQUE IOHEXOL 350 MG/ML SOLN COMPARISON:  05/01/2019 CT head FINDINGS: CTA NECK FINDINGS Aortic arch: Left vertebral artery arises from the aorta. Imaged portion shows no evidence of aneurysm or dissection. No significant stenosis of the major arch vessel origins. Aortic calcific atherosclerosis. Right carotid system: No evidence of dissection, stenosis (50% or greater) or occlusion. Left carotid system: Mixed plaque of left carotid bifurcation with moderate 50-70% proximal ICA stenosis.  Vertebral arteries: Codominant. No evidence of dissection, stenosis (50% or greater) or occlusion. Mild stenosis of proximal right V2. calcified plaque of left vertebral artery origin with mild stenosis. Skeleton: No acute finding. Other neck: Negative. Upper chest: Centrilobular emphysema. Review of the MIP images confirms the above findings CTA HEAD FINDINGS Anterior circulation: Calcific atherosclerosis of the carotid siphons with moderate right and mild left paraclinoid ICA stenosis. Moderate right A2 and mild left A2 stenosis. Otherwise no significant stenosis, proximal occlusion, aneurysm, or vascular malformation. Posterior circulation: Moderate right P1 and mild left P1 stenosis. Upper basilar mild stenosis. Otherwise no significant stenosis, proximal occlusion, aneurysm, or vascular malformation. Venous sinuses: As permitted by contrast timing, patent. Anatomic variants: None significant. Review of the MIP images confirms the above findings CT Brain Perfusion Findings: ASPECTS: 10 CBF (<30%) Volume: 74mL Perfusion (Tmax>6.0s) volume: 32mL Mismatch Volume: 35mL Infarction Location:Not applicable. IMPRESSION: CT brain perfusion: 1. No acute stroke or significant oligemia by standard perfusion criteria. ASPECTS is 10. CTA head: 1. No large vessel occlusion, aneurysm, or vascular malformation identified. 2. Extensive intracranial atherosclerosis with multiple areas of mild-to-moderate stenosis in the anterior and posterior circulations. CTA neck: 1. Patent carotid and vertebral arteries. No dissection, occlusion, or aneurysm. 2. Left proximal ICA moderate 50-70% stenosis with mixed plaque. 3. Segments of mild stenosis of proximal bilateral vertebral arteries. 4. Aortic Atherosclerosis (ICD10-I70.0) and Emphysema (ICD10-J43.9). These results were called by telephone at the time of interpretation on 05/01/2019 at 2:37 am to Dr. Roland Rack , who verbally acknowledged these results. Electronically Signed   By:  Kristine Garbe M.D.   On: 05/01/2019 02:41   Ct Angio Neck W Or Wo Contrast  Result Date: 05/01/2019 CLINICAL DATA:  83 y/o  F; left-sided weakness and slurred speech. EXAM: CT ANGIOGRAPHY HEAD AND NECK CT PERFUSION BRAIN TECHNIQUE: Multidetector CT imaging of the head and neck was performed using the standard protocol during bolus administration of intravenous contrast. Multiplanar CT image reconstructions and MIPs were obtained to evaluate the vascular anatomy. Carotid stenosis measurements (when applicable) are obtained utilizing NASCET criteria, using the distal internal carotid diameter as the denominator. Multiphase CT imaging of the brain was performed following IV bolus contrast injection. Subsequent parametric perfusion maps were calculated using RAPID software. CONTRAST:  145mL OMNIPAQUE IOHEXOL 350 MG/ML SOLN COMPARISON:  05/01/2019 CT head FINDINGS: CTA NECK FINDINGS Aortic arch: Left vertebral artery arises from the aorta. Imaged portion shows no evidence of aneurysm or dissection. No significant stenosis of the major arch vessel origins. Aortic calcific atherosclerosis. Right carotid system: No evidence of dissection, stenosis (50% or greater) or occlusion. Left carotid system: Mixed plaque of left carotid bifurcation with moderate 50-70% proximal ICA stenosis. Vertebral arteries: Codominant. No evidence of dissection, stenosis (  50% or greater) or occlusion. Mild stenosis of proximal right V2. calcified plaque of left vertebral artery origin with mild stenosis. Skeleton: No acute finding. Other neck: Negative. Upper chest: Centrilobular emphysema. Review of the MIP images confirms the above findings CTA HEAD FINDINGS Anterior circulation: Calcific atherosclerosis of the carotid siphons with moderate right and mild left paraclinoid ICA stenosis. Moderate right A2 and mild left A2 stenosis. Otherwise no significant stenosis, proximal occlusion, aneurysm, or vascular malformation. Posterior  circulation: Moderate right P1 and mild left P1 stenosis. Upper basilar mild stenosis. Otherwise no significant stenosis, proximal occlusion, aneurysm, or vascular malformation. Venous sinuses: As permitted by contrast timing, patent. Anatomic variants: None significant. Review of the MIP images confirms the above findings CT Brain Perfusion Findings: ASPECTS: 10 CBF (<30%) Volume: 31mL Perfusion (Tmax>6.0s) volume: 23mL Mismatch Volume: 11mL Infarction Location:Not applicable. IMPRESSION: CT brain perfusion: 1. No acute stroke or significant oligemia by standard perfusion criteria. ASPECTS is 10. CTA head: 1. No large vessel occlusion, aneurysm, or vascular malformation identified. 2. Extensive intracranial atherosclerosis with multiple areas of mild-to-moderate stenosis in the anterior and posterior circulations. CTA neck: 1. Patent carotid and vertebral arteries. No dissection, occlusion, or aneurysm. 2. Left proximal ICA moderate 50-70% stenosis with mixed plaque. 3. Segments of mild stenosis of proximal bilateral vertebral arteries. 4. Aortic Atherosclerosis (ICD10-I70.0) and Emphysema (ICD10-J43.9). These results were called by telephone at the time of interpretation on 05/01/2019 at 2:37 am to Dr. Roland Rack , who verbally acknowledged these results. Electronically Signed   By: Kristine Garbe M.D.   On: 05/01/2019 02:41   Mr Brain Wo Contrast  Result Date: 05/01/2019 CLINICAL DATA:  Left-sided weakness and slurred speech EXAM: MRI HEAD WITHOUT CONTRAST TECHNIQUE: Multiplanar, multiecho pulse sequences of the brain and surrounding structures were obtained without intravenous contrast. COMPARISON:  CTA head neck from earlier today FINDINGS: Brain: Moderate acute infarction in the right pons respecting the midline. No prior infarct is seen. There is minimal chronic small vessel ischemic type change in the cerebral white matter. Brain volume is normal. No acute hemorrhage, hydrocephalus, or  masslike finding. Vascular: Major flow voids are preserved. CTA was performed earlier today Skull and upper cervical spine: Negative for marrow lesion. Degenerative cervical facet spurring. Sinuses/Orbits: Bilateral mastoid opacification with negative nasopharynx. Mastoid opacification was also seen on head CT 1 year prior. Other: Intermittent motion degradation. IMPRESSION: Acute right pontine infarction. Electronically Signed   By: Monte Fantasia M.D.   On: 05/01/2019 04:28   Ct Cerebral Perfusion W Contrast  Result Date: 05/01/2019 CLINICAL DATA:  83 y/o  F; left-sided weakness and slurred speech. EXAM: CT ANGIOGRAPHY HEAD AND NECK CT PERFUSION BRAIN TECHNIQUE: Multidetector CT imaging of the head and neck was performed using the standard protocol during bolus administration of intravenous contrast. Multiplanar CT image reconstructions and MIPs were obtained to evaluate the vascular anatomy. Carotid stenosis measurements (when applicable) are obtained utilizing NASCET criteria, using the distal internal carotid diameter as the denominator. Multiphase CT imaging of the brain was performed following IV bolus contrast injection. Subsequent parametric perfusion maps were calculated using RAPID software. CONTRAST:  132mL OMNIPAQUE IOHEXOL 350 MG/ML SOLN COMPARISON:  05/01/2019 CT head FINDINGS: CTA NECK FINDINGS Aortic arch: Left vertebral artery arises from the aorta. Imaged portion shows no evidence of aneurysm or dissection. No significant stenosis of the major arch vessel origins. Aortic calcific atherosclerosis. Right carotid system: No evidence of dissection, stenosis (50% or greater) or occlusion. Left carotid system: Mixed plaque of left  carotid bifurcation with moderate 50-70% proximal ICA stenosis. Vertebral arteries: Codominant. No evidence of dissection, stenosis (50% or greater) or occlusion. Mild stenosis of proximal right V2. calcified plaque of left vertebral artery origin with mild stenosis.  Skeleton: No acute finding. Other neck: Negative. Upper chest: Centrilobular emphysema. Review of the MIP images confirms the above findings CTA HEAD FINDINGS Anterior circulation: Calcific atherosclerosis of the carotid siphons with moderate right and mild left paraclinoid ICA stenosis. Moderate right A2 and mild left A2 stenosis. Otherwise no significant stenosis, proximal occlusion, aneurysm, or vascular malformation. Posterior circulation: Moderate right P1 and mild left P1 stenosis. Upper basilar mild stenosis. Otherwise no significant stenosis, proximal occlusion, aneurysm, or vascular malformation. Venous sinuses: As permitted by contrast timing, patent. Anatomic variants: None significant. Review of the MIP images confirms the above findings CT Brain Perfusion Findings: ASPECTS: 10 CBF (<30%) Volume: 36mL Perfusion (Tmax>6.0s) volume: 34mL Mismatch Volume: 50mL Infarction Location:Not applicable. IMPRESSION: CT brain perfusion: 1. No acute stroke or significant oligemia by standard perfusion criteria. ASPECTS is 10. CTA head: 1. No large vessel occlusion, aneurysm, or vascular malformation identified. 2. Extensive intracranial atherosclerosis with multiple areas of mild-to-moderate stenosis in the anterior and posterior circulations. CTA neck: 1. Patent carotid and vertebral arteries. No dissection, occlusion, or aneurysm. 2. Left proximal ICA moderate 50-70% stenosis with mixed plaque. 3. Segments of mild stenosis of proximal bilateral vertebral arteries. 4. Aortic Atherosclerosis (ICD10-I70.0) and Emphysema (ICD10-J43.9). These results were called by telephone at the time of interpretation on 05/01/2019 at 2:37 am to Dr. Roland Rack , who verbally acknowledged these results. Electronically Signed   By: Kristine Garbe M.D.   On: 05/01/2019 02:41   Ct Head Code Stroke Wo Contrast  Result Date: 05/01/2019 CLINICAL DATA:  Code stroke. 83 y/o F; left-sided weakness and slurred speech. EXAM: CT  HEAD WITHOUT CONTRAST TECHNIQUE: Contiguous axial images were obtained from the base of the skull through the vertex without intravenous contrast. COMPARISON:  08/29/2018 CT head. FINDINGS: Brain: Interval small age-indeterminate infarcts are present within the right caudate head and left cerebellar hemisphere. Stable small chronic infarct within the left anterior putamen. No large acute vascular territory infarction, hemorrhage, mass effect, extra-axial collection, hydrocephalus, or herniation. Stable nonspecific white matter hypodensities compatible with chronic microvascular ischemic changes and stable volume loss of the brain. Vascular: Calcific atherosclerosis of carotid siphons and vertebral arteries. No hyperdense vessel identified. Skull: Normal. Negative for fracture or focal lesion. Sinuses/Orbits: Mucosal thickening of the right maxillary sinus and right anterior ethmoid air cells. Chronic inflammatory changes of the walls of the right maxillary sinus. Opacification of bilateral mastoid air cells. Orbits are unremarkable. Other: None. ASPECTS [facility] Stroke Program Early CT Score) - Ganglionic level infarction (caudate, lentiform nuclei, internal capsule, insula, M1-M3 cortex): 7 - Supraganglionic infarction (M4-M6 cortex): 3 Total score (0-10 with 10 being normal): 10 IMPRESSION: 1. Interval small age-indeterminate infarcts within the right caudate head and left cerebellar hemisphere. 2. Otherwise no large acute stroke, hemorrhage, or mass effect identified. ASPECTS is 10. 3. Stable chronic microvascular ischemic changes and parenchymal volume loss of the brain. These results were called by telephone at the time of interpretation on 05/01/2019 at 2:06 am to Dr. Leonel Ramsay, who verbally acknowledged these results. Electronically Signed   By: Kristine Garbe M.D.   On: 05/01/2019 02:06    Microbiology: No results found for this or any previous visit (from the past 240 hour(s)).    Labs: Basic Metabolic Panel: Recent Labs  Lab 05/01/19  9728 05/01/19 0201  NA 142  --   K 3.4*  --   CL 103  --   CO2 30  --   GLUCOSE 116*  --   BUN 43*  --   CREATININE 1.39* 1.30*  CALCIUM 8.7*  --    Liver Function Tests: Recent Labs  Lab 05/01/19 0152  AST 15  ALT 11  ALKPHOS 65  BILITOT 0.2*  PROT 6.1*  ALBUMIN 2.9*   No results for input(s): LIPASE, AMYLASE in the last 168 hours. No results for input(s): AMMONIA in the last 168 hours. CBC: Recent Labs  Lab 05/01/19 0152  WBC 10.1  NEUTROABS 7.0  HGB 8.4*  HCT 28.5*  MCV 89.1  PLT 499*   Cardiac Enzymes: No results for input(s): CKTOTAL, CKMB, CKMBINDEX, TROPONINI in the last 168 hours. BNP: BNP (last 3 results) Recent Labs    06/09/18 0809  BNP 33.4    ProBNP (last 3 results) No results for input(s): PROBNP in the last 8760 hours.  CBG: Recent Labs  Lab 05/01/19 0251  GLUCAP 127*       Signed:  Radene Gunning NP [facility] 05/02/2019, 1:16 PM

## 2019-05-02 NOTE — H&P (Signed)
Physical Medicine and Rehabilitation Admission H&P    CC: Stroke with functional deficits.   HPI:  [name] is an 83 year old femle with history of COPD- oxygen dependent, HTN, chronic back pain who was admitted on 05/01/19 with slurred speech, left sided weakness and difficulty walking. 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 MRI brain showed acute right pontine infarct. 2D echo with bubble study showed EF 60-65% with normal systolic function and negative bubble study. Dr. Erlinda Hong felt that stroke was due to small vessel disease and recommended DAPT folowed by ASA alone and outpatient follow up with [facility] for monitoring of asymptomatic L-ICA stenosis.  Patient with resultant left sided weakness with poor postural control at EOB, mild cognitive deficits and moderate flaccid dysarthria affecting mobility and self care. CIR recommended due to functional deficits.     Review of Systems  Constitutional: Negative for chills and fever.  HENT: Positive for hearing loss.   Eyes: Negative for blurred vision and double vision.  Respiratory: Positive for wheezing. Negative for cough.   Cardiovascular: Negative for chest pain and palpitations.  Gastrointestinal: Positive for heartburn. Negative for nausea.  Genitourinary: Negative for dysuria and urgency.  Musculoskeletal: Positive for back pain (chronic), falls and myalgias.  Neurological: Positive for focal weakness and headaches (persistent). Negative for dizziness.     Past Medical History:  Diagnosis Date   CAP (community acquired pneumonia)    multiple admissions   Chronic back pain 08/22/2018   Colitis 03/19/2014   Compression fx, lumbar spine (HCC)    COPD (chronic obstructive pulmonary disease) ([place])    Delirium    Hypertension    On home oxygen therapy 06-14-13   continuos 2.5l/m nasally-24/7   Osteoporosis     Sacral fracture ([place]) 2014    Past Surgical History:  Procedure Laterality Date   ABDOMINAL HYSTERECTOMY     ESOPHAGOGASTRODUODENOSCOPY N/A 06/15/2013   Procedure: ESOPHAGOGASTRODUODENOSCOPY (EGD);  Surgeon: [name], MD;  Location: [facility];  Service: Endoscopy;  Laterality: N/A;   IR GENERIC HISTORICAL  10/15/2016   IR KYPHO LUMBAR INC FX REDUCE BONE BX UNI/BIL CANNULATION INC/IMAGING 10/15/2016 Luanne Bras, MD [facility]   sacroplasty  06-14-13   05-10-13 IVR [facility] for fracture stabilization    Family History  Problem Relation Age of Onset   Hypertension Sister    Diabetes Mellitus II Sister     Social History:  Son lives with her and manages home. 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 Her smoking use included cigarettes. She has never used smokeless tobacco. She reports that she does not drink alcohol or use drugs.   Allergies  Allergen Reactions   Ambien [Zolpidem] Other (See Comments)    Hallucinations and paranoid   Medications Prior to Admission  Medication Sig Dispense Refill   acetaminophen (TYLENOL) 500 MG tablet Take 1 tablet (500 mg total) by mouth 3 (three) times daily. 30 tablet 0   albuterol (PROVENTIL HFA;VENTOLIN HFA) 108 (90 Base) MCG/ACT inhaler Inhale 1-2 puffs into the lungs every 4 (four) hours as needed for wheezing or shortness of breath. 1 Inhaler 0   amLODipine (NORVASC) 5 MG tablet Take 1 tablet (5 mg total) by mouth daily. 30 tablet 0   diclofenac sodium (VOLTAREN) 1 % GEL Apply 2 g topically 4 (four) times daily.     docusate sodium (  COLACE) 250 MG capsule Take 1 capsule (250 mg total) by mouth daily. 10 capsule 0   DULoxetine (CYMBALTA) 30 MG capsule Take 30-60 capsules by mouth See admin instructions. 1 capsule daily for 1 week, increase to 2 capsules therafter  0   feeding supplement, ENSURE ENLIVE, (ENSURE ENLIVE) LIQD Take 237 mLs by mouth 2 (two) times daily between meals. 237  mL 12   gabapentin (NEURONTIN) 100 MG capsule Take 100-300 mg by mouth at bedtime as needed for pain.  0   lidocaine (LIDODERM) 5 % Place 1 patch onto the skin daily. Remove & Discard patch within 12 hours or as directed by MD 30 patch 0   oxyCODONE (OXY IR/ROXICODONE) 5 MG immediate release tablet Take 1 tablet (5 mg total) by mouth every 6 (six) hours as needed for breakthrough pain. 20 tablet 0   oxyCODONE-acetaminophen (PERCOCET/ROXICET) 5-325 MG tablet Take 1-2 tablets by mouth every 6 (six) hours as needed for severe pain.     polyethylene glycol (MIRALAX / GLYCOLAX) packet Take 17 g by mouth daily as needed for mild constipation. (Patient not taking: Reported on 08/18/2018) 14 each 0   senna (SENOKOT) 8.6 MG TABS tablet Take 1 tablet (8.6 mg total) by mouth 2 (two) times daily. 60 each 0   Syringe/Needle, Disp, (SYRINGE 3CC/22GX3/4") 22G X 3/4" 3 ML MISC 1,000 mcg by Does not apply route daily. 100 each 0   vitamin B-12 1000 MCG tablet Take 1 tablet (1,000 mcg total) by mouth daily. 30 tablet 0    Drug Regimen Review  Drug regimen was reviewed and remains appropriate with no significant issues identified  Home: Home Living Family/patient expects to be discharged to:: Private residence Living Arrangements: (son, Legrand Como) Available Help at Discharge: Family, Available 24 hours/day Type of Home: House Home Access: Stairs to enter [employer] of Steps: 2 Entrance Stairs-Rails: Right Home Layout: One level Bathroom Shower/Tub: Tub/shower unit, Architectural technologist: Standard Bathroom Accessibility: Yes Home Equipment: Environmental consultant - 2 wheels, Cane - single point, Bedside commode, Walker - 4 wheels Additional Comments: [name] has been asissting with bathing and dressing over past few weeks due to her decline in function  Lives With: Son(son, [name], lives with patient)   Functional History: Prior Function Level of Independence: Needs assistance Gait / Transfers Assistance  Needed: Pt using a rollator for ambulation ADL's / Homemaking Assistance Needed: Pt reports she was independent with BADL's but son assisted with IADL's.  Comments: pt states she does all BADLs and son helps with IADLs  Functional Status:  Mobility: Bed Mobility Overal bed mobility: Needs Assistance Bed Mobility: Supine to Sit Supine to sit: Max assist, +2 for physical assistance General bed mobility comments: Assist for LE advancement towards EOB, scooting and trunk elevation to full sitting position. Bed pad utilized for scooting assist.  Transfers Overall transfer level: Needs assistance Equipment used: 2 person hand held assist Transfer via Lift Equipment: Stedy Transfers: Sit to/from Stand Sit to Stand: Max assist, +2 physical assistance Stand pivot transfers: Max assist, +2 physical assistance General transfer comment: +2 assist to power-up to full standing position. Increased multimodal cues to maximize hip extension enough to get Stedy flaps under her.  Ambulation/Gait General Gait Details: Pt was able to deomstrate pre-gait activity within [place]. Weight shifting x10, marching in place x2 each side with therapist assist to stabilize R knee during L march.     ADL: ADL Overall ADL's : Needs assistance/impaired Eating/Feeding: Total assistance Eating/Feeding Details (indicate cue type  and reason): supported sitting--due to pain in Bil shoulders and decreased use of LUE from CVA Grooming: Total assistance Grooming Details (indicate cue type and reason): supported sitting--due to pain in Bil shoulders and decreased use of LUE from CVA Upper Body Bathing: Total assistance Upper Body Bathing Details (indicate cue type and reason): supported sitting--due to pain in Bil shoulders and decreased use of LUE from CVA Lower Body Bathing: Total assistance Lower Body Bathing Details (indicate cue type and reason): Max A +2 sit<>stand Upper Body Dressing : Total assistance Upper Body  Dressing Details (indicate cue type and reason): supported sitting--due to pain in Bil shoulders and decreased use of LUE from CVA Lower Body Dressing: Total assistance Lower Body Dressing Details (indicate cue type and reason): Max A +2 sit<>stand Toilet Transfer: Maximal assistance, +2 for physical assistance, Control and instrumentation engineer Details (indicate cue type and reason): bed>recliner going to pt's right Toileting- Clothing Manipulation and Hygiene: Total assistance Toileting - Clothing Manipulation Details (indicate cue type and reason): Max A +2 sit<>stand  Cognition: Cognition Overall Cognitive Status: Impaired/Different from baseline Arousal/Alertness: Awake/alert Orientation Level: Oriented to place, Oriented to person, Oriented to situation Attention: Selective, Alternating Selective Attention: Appears intact Alternating Attention: Impaired Alternating Attention Impairment: Verbal complex Memory: Impaired Memory Impairment: Storage deficit, Decreased recall of new information, Retrieval deficit Awareness: Impaired Awareness Impairment: Emergent impairment Problem Solving: Appears intact Executive Function: Organizing, Self Monitoring Organizing: Impaired Organizing Impairment: Verbal complex Self Monitoring: Impaired Self Monitoring Impairment: Verbal complex, Functional basic Behaviors: Restless Safety/Judgment: Impaired Cognition Arousal/Alertness: Awake/alert Behavior During Therapy: Anxious(emotional at times) Overall Cognitive Status: Impaired/Different from baseline Area of Impairment: Memory, Attention, Following commands, Safety/judgement, Awareness, Problem solving Orientation Level: Disoriented to, Situation Current Attention Level: Sustained Memory: Decreased short-term memory Following Commands: Follows one step commands inconsistently, Follows one step commands with increased time Safety/Judgement: Decreased awareness of safety, Decreased awareness of  deficits Awareness: Intellectual Problem Solving: Slow processing, Decreased initiation, Requires verbal cues, Requires tactile cues, Difficulty sequencing General Comments: Asked multiple times "What's wrong with me?" and required redirection to situation. Poor awareness of safety, thinking she could walk after it took 2 people to transfer to the chair only,   Blood pressure 140/73, pulse 96, temperature 98.9 F (37.2 C), temperature source Axillary, resp. rate 12, height 4\' 9"  (1.448 m), weight 60.8 kg, SpO2 100 %. Physical Exam  Nursing note and vitals reviewed. Constitutional: She appears well-developed and well-nourished.  Has damp rag over forehead    HENT:  Head: Normocephalic and atraumatic.  Eyes: Pupils are equal, round, and reactive to light. EOM are normal.  Neck: Normal range of motion. No tracheal deviation present. No thyromegaly present.  Cardiovascular: Normal rate and regular rhythm. Exam reveals no friction rub.  No murmur heard. Respiratory: She has no wheezes.  Increase WOB with conversation.   GI: She exhibits no distension. There is no abdominal tenderness.  Umbilical hernia noted.   Musculoskeletal:        General: Edema (trace to 1+ LE edema) present.     Comments: Low back very painful, affecting movement and deep breathing  Neurological: She is alert.  HOH? Disoriented -thinks that she's been here for 5 day and easily agitated. Needed coaxing to follow simple motor commands--poor effort. RUE grossly 3- to 3/5. RLE 2/5 LUE 1-2/5 LLE 1/5. Mild LUE flexor tone, LLE extensor tone. Decreased sense of pain LLE.   Skin: Skin is warm.  Psychiatric:  Anxious, followed simple commands. Distracted by pain  Results for orders placed or performed during the hospital encounter of 05/01/19 (from the past 48 hour(s))  Protime-INR     Status: None   Collection Time: 05/01/19  1:52 AM  Result Value Ref Range   Prothrombin Time 13.0 11.4 - 15.2 seconds   INR 1.0 0.8 -  1.2    Comment: (NOTE) INR goal varies based on device and disease states. Performed at [facility], [place] [address], [place], [place] [address]   APTT     Status: None   Collection Time: 05/01/19  1:52 AM  Result Value Ref Range   aPTT 27 24 - 36 seconds    Comment: Performed at [facility] [address], [place], [place] [address]  CBC     Status: Abnormal   Collection Time: 05/01/19  1:52 AM  Result Value Ref Range   WBC 10.1 4.0 - 10.5 K/uL   RBC 3.20 (L) 3.87 - 5.11 MIL/uL   Hemoglobin 8.4 (L) 12.0 - 15.0 g/dL   HCT 28.5 (L) 36.0 - 46.0 %   MCV 89.1 80.0 - 100.0 fL   MCH 26.3 26.0 - 34.0 pg   MCHC 29.5 (L) 30.0 - 36.0 g/dL   RDW 14.0 11.5 - 15.5 %   Platelets 499 (H) 150 - 400 K/uL   nRBC 0.0 0.0 - 0.2 %    Comment: Performed at [facility], [place] [address], [place], [place] [address]  Differential     Status: None   Collection Time: 05/01/19  1:52 AM  Result Value Ref Range   Neutrophils Relative % 68 %   Neutro Abs 7.0 1.7 - 7.7 K/uL   Lymphocytes Relative 18 %   Lymphs Abs 1.8 0.7 - 4.0 K/uL   Monocytes Relative 8 %   Monocytes Absolute 0.8 0.1 - 1.0 K/uL   Eosinophils Relative 4 %   Eosinophils Absolute 0.4 0.0 - 0.5 K/uL   Basophils Relative 1 %   Basophils Absolute 0.1 0.0 - 0.1 K/uL   Immature Granulocytes 1 %   Abs Immature Granulocytes 0.07 0.00 - 0.07 K/uL    Comment: Performed at [facility] [address] [place], [place] [address]  Comprehensive metabolic panel     Status: Abnormal   Collection Time: 05/01/19  1:52 AM  Result Value Ref Range   Sodium 142 135 - 145 mmol/L   Potassium 3.4 (L) 3.5 - 5.1 mmol/L   Chloride 103 98 - 111 mmol/L   CO2 30 22 - 32 mmol/L   Glucose, Bld 116 (H) 70 - 99 mg/dL   BUN 43 (H) 8 - 23 mg/dL   Creatinine, Ser 1.39 (H) 0.44 - 1.00 mg/dL   Calcium 8.7 (L) 8.9 - 10.3 mg/dL   Total Protein 6.1 (L) 6.5 - 8.1 g/dL   Albumin 2.9 (L) 3.5 - 5.0 g/dL   AST 15 15 - 41 U/L   ALT 11 0 - 44 U/L    Alkaline Phosphatase 65 38 - 126 U/L   Total Bilirubin 0.2 (L) 0.3 - 1.2 mg/dL   GFR calc non Af Amer 36 (L) >60 mL/min   GFR calc Af Amer 41 (L) >60 mL/min   Anion gap 9 5 - 15    Comment: Performed at [facility], [place] [address], [place], [place] [address]  I-stat Creatinine, ED     Status: Abnormal   Collection Time: 05/01/19  2:01 AM  Result Value Ref Range  Creatinine, Ser 1.30 (H) 0.44 - 1.00 mg/dL  CBG monitoring, ED     Status: Abnormal   Collection Time: 05/01/19  2:51 AM  Result Value Ref Range   Glucose-Capillary 127 (H) 70 - 99 mg/dL   Comment 1 Notify RN    Comment 2 Document in Chart   Hemoglobin A1c     Status: Abnormal   Collection Time: 05/01/19  5:18 AM  Result Value Ref Range   Hgb A1c MFr Bld 6.2 (H) 4.8 - 5.6 %    Comment: (NOTE) Pre diabetes:          5.7%-6.4% Diabetes:              >6.4% Glycemic control for   <7.0% adults with diabetes    Mean Plasma Glucose 131.24 mg/dL    Comment: Performed at [facility], [place] [address], [place], [place] [address]  Lipid panel     Status: Abnormal   Collection Time: 05/01/19  5:18 AM  Result Value Ref Range   Cholesterol 170 0 - 200 mg/dL   Triglycerides 96 <150 mg/dL   HDL 45 >40 mg/dL   Total CHOL/HDL Ratio 3.8 RATIO   VLDL 19 0 - 40 mg/dL   LDL Cholesterol 106 (H) 0 - 99 mg/dL    Comment:        Total Cholesterol/HDL:CHD Risk Coronary Heart Disease Risk Table                     Men   Women  1/2 Average Risk   3.4   3.3  Average Risk       5.0   4.4  2 X Average Risk   9.6   7.1  3 X Average Risk  23.4   11.0        Use the calculated Patient Ratio above and the CHD Risk Table to determine the patient's CHD Risk.        ATP III CLASSIFICATION (LDL):  <100     mg/dL   Optimal  100-129  mg/dL   Near or Above                    Optimal  130-159  mg/dL   Borderline  160-189  mg/dL   High  >190     mg/dL   Very High Performed at [facility] [address] [place], [place]  [address]   Vitamin B12     Status: None   Collection Time: 05/02/19  7:14 AM  Result Value Ref Range   Vitamin B-12 224 180 - 914 pg/mL    Comment: (NOTE) This assay is not validated for testing neonatal or myeloproliferative syndrome specimens for Vitamin B12 levels. Performed at [facility], [place] [address] [place], [place] [address]   Folate     Status: None   Collection Time: 05/02/19  7:14 AM  Result Value Ref Range   Folate 10.6 >5.9 ng/mL    Comment: Performed at [facility] [address], [place], [place] [address]  Iron and TIBC     Status: Abnormal   Collection Time: 05/02/19  7:14 AM  Result Value Ref Range   Iron 21 (L) 28 - 170 ug/dL   TIBC 308 250 - 450 ug/dL   Saturation Ratios 7 (L) 10.4 - 31.8 %   UIBC 287 ug/dL    Comment: Performed at [facility], [place]  [address] [place], [place] [address]  Ferritin     Status: None   Collection Time: 05/02/19  7:14 AM  Result Value Ref Range   Ferritin 19 11 - 307 ng/mL    Comment: Performed at [facility], [place] [address], [place], [place] [address]  Reticulocytes     Status: Abnormal   Collection Time: 05/02/19  7:14 AM  Result Value Ref Range   Retic Ct Pct 2.1 0.4 - 3.1 %   RBC. 3.05 (L) 3.87 - 5.11 MIL/uL   Retic Count, Absolute 64.4 19.0 - 186.0 K/uL   Immature Retic Fract 18.8 (H) 2.3 - 15.9 %    Comment: Performed at [facility] [address], [place], [place] [address]   Ct Angio Head W Or Wo Contrast  Result Date: 05/01/2019 CLINICAL DATA:  83 y/o  F; left-sided weakness and slurred speech. EXAM: CT ANGIOGRAPHY HEAD AND NECK CT PERFUSION BRAIN TECHNIQUE: Multidetector CT imaging of the head and neck was performed using the standard protocol during bolus administration of intravenous contrast. Multiplanar CT image reconstructions and MIPs were obtained to evaluate the vascular anatomy. Carotid stenosis measurements (when applicable) are obtained utilizing NASCET criteria, using  the distal internal carotid diameter as the denominator. Multiphase CT imaging of the brain was performed following IV bolus contrast injection. Subsequent parametric perfusion maps were calculated using RAPID software. CONTRAST:  143mL OMNIPAQUE IOHEXOL 350 MG/ML SOLN COMPARISON:  05/01/2019 CT head FINDINGS: CTA NECK FINDINGS Aortic arch: Left vertebral artery arises from the aorta. Imaged portion shows no evidence of aneurysm or dissection. No significant stenosis of the major arch vessel origins. Aortic calcific atherosclerosis. Right carotid system: No evidence of dissection, stenosis (50% or greater) or occlusion. Left carotid system: Mixed plaque of left carotid bifurcation with moderate 50-70% proximal ICA stenosis. Vertebral arteries: Codominant. No evidence of dissection, stenosis (50% or greater) or occlusion. Mild stenosis of proximal right V2. calcified plaque of left vertebral artery origin with mild stenosis. Skeleton: No acute finding. Other neck: Negative. Upper chest: Centrilobular emphysema. Review of the MIP images confirms the above findings CTA HEAD FINDINGS Anterior circulation: Calcific atherosclerosis of the carotid siphons with moderate right and mild left paraclinoid ICA stenosis. Moderate right A2 and mild left A2 stenosis. Otherwise no significant stenosis, proximal occlusion, aneurysm, or vascular malformation. Posterior circulation: Moderate right P1 and mild left P1 stenosis. Upper basilar mild stenosis. Otherwise no significant stenosis, proximal occlusion, aneurysm, or vascular malformation. Venous sinuses: As permitted by contrast timing, patent. Anatomic variants: None significant. Review of the MIP images confirms the above findings CT Brain Perfusion Findings: ASPECTS: 10 CBF (<30%) Volume: 77mL Perfusion (Tmax>6.0s) volume: 54mL Mismatch Volume: 49mL Infarction Location:Not applicable. IMPRESSION: CT brain perfusion: 1. No acute stroke or significant oligemia by standard perfusion  criteria. ASPECTS is 10. CTA head: 1. No large vessel occlusion, aneurysm, or vascular malformation identified. 2. Extensive intracranial atherosclerosis with multiple areas of mild-to-moderate stenosis in the anterior and posterior circulations. CTA neck: 1. Patent carotid and vertebral arteries. No dissection, occlusion, or aneurysm. 2. Left proximal ICA moderate 50-70% stenosis with mixed plaque. 3. Segments of mild stenosis of proximal bilateral vertebral arteries. 4. Aortic Atherosclerosis (ICD10-I70.0) and Emphysema (ICD10-J43.9). These results were called by telephone at the time of interpretation on 05/01/2019 at 2:37 am to Dr. Roland Rack , who verbally acknowledged these results. Electronically Signed   By: Kristine Garbe M.D.   On: 05/01/2019 02:41   Ct Angio Neck W Or  Wo Contrast  Result Date: 05/01/2019 CLINICAL DATA:  83 y/o  F; left-sided weakness and slurred speech. EXAM: CT ANGIOGRAPHY HEAD AND NECK CT PERFUSION BRAIN TECHNIQUE: Multidetector CT imaging of the head and neck was performed using the standard protocol during bolus administration of intravenous contrast. Multiplanar CT image reconstructions and MIPs were obtained to evaluate the vascular anatomy. Carotid stenosis measurements (when applicable) are obtained utilizing NASCET criteria, using the distal internal carotid diameter as the denominator. Multiphase CT imaging of the brain was performed following IV bolus contrast injection. Subsequent parametric perfusion maps were calculated using RAPID software. CONTRAST:  128mL OMNIPAQUE IOHEXOL 350 MG/ML SOLN COMPARISON:  05/01/2019 CT head FINDINGS: CTA NECK FINDINGS Aortic arch: Left vertebral artery arises from the aorta. Imaged portion shows no evidence of aneurysm or dissection. No significant stenosis of the major arch vessel origins. Aortic calcific atherosclerosis. Right carotid system: No evidence of dissection, stenosis (50% or greater) or occlusion. Left carotid  system: Mixed plaque of left carotid bifurcation with moderate 50-70% proximal ICA stenosis. Vertebral arteries: Codominant. No evidence of dissection, stenosis (50% or greater) or occlusion. Mild stenosis of proximal right V2. calcified plaque of left vertebral artery origin with mild stenosis. Skeleton: No acute finding. Other neck: Negative. Upper chest: Centrilobular emphysema. Review of the MIP images confirms the above findings CTA HEAD FINDINGS Anterior circulation: Calcific atherosclerosis of the carotid siphons with moderate right and mild left paraclinoid ICA stenosis. Moderate right A2 and mild left A2 stenosis. Otherwise no significant stenosis, proximal occlusion, aneurysm, or vascular malformation. Posterior circulation: Moderate right P1 and mild left P1 stenosis. Upper basilar mild stenosis. Otherwise no significant stenosis, proximal occlusion, aneurysm, or vascular malformation. Venous sinuses: As permitted by contrast timing, patent. Anatomic variants: None significant. Review of the MIP images confirms the above findings CT Brain Perfusion Findings: ASPECTS: 10 CBF (<30%) Volume: 55mL Perfusion (Tmax>6.0s) volume: 57mL Mismatch Volume: 22mL Infarction Location:Not applicable. IMPRESSION: CT brain perfusion: 1. No acute stroke or significant oligemia by standard perfusion criteria. ASPECTS is 10. CTA head: 1. No large vessel occlusion, aneurysm, or vascular malformation identified. 2. Extensive intracranial atherosclerosis with multiple areas of mild-to-moderate stenosis in the anterior and posterior circulations. CTA neck: 1. Patent carotid and vertebral arteries. No dissection, occlusion, or aneurysm. 2. Left proximal ICA moderate 50-70% stenosis with mixed plaque. 3. Segments of mild stenosis of proximal bilateral vertebral arteries. 4. Aortic Atherosclerosis (ICD10-I70.0) and Emphysema (ICD10-J43.9). These results were called by telephone at the time of interpretation on 05/01/2019 at 2:37 am to  Dr. Roland Rack , who verbally acknowledged these results. Electronically Signed   By: Kristine Garbe M.D.   On: 05/01/2019 02:41   Mr Brain Wo Contrast  Result Date: 05/01/2019 CLINICAL DATA:  Left-sided weakness and slurred speech EXAM: MRI HEAD WITHOUT CONTRAST TECHNIQUE: Multiplanar, multiecho pulse sequences of the brain and surrounding structures were obtained without intravenous contrast. COMPARISON:  CTA head neck from earlier today FINDINGS: Brain: Moderate acute infarction in the right pons respecting the midline. No prior infarct is seen. There is minimal chronic small vessel ischemic type change in the cerebral white matter. Brain volume is normal. No acute hemorrhage, hydrocephalus, or masslike finding. Vascular: Major flow voids are preserved. CTA was performed earlier today Skull and upper cervical spine: Negative for marrow lesion. Degenerative cervical facet spurring. Sinuses/Orbits: Bilateral mastoid opacification with negative nasopharynx. Mastoid opacification was also seen on head CT 1 year prior. Other: Intermittent motion degradation. IMPRESSION: Acute right pontine infarction. Electronically Signed  By: Monte Fantasia M.D.   On: 05/01/2019 04:28   Ct Cerebral Perfusion W Contrast  Result Date: 05/01/2019 CLINICAL DATA:  83 y/o  F; left-sided weakness and slurred speech. EXAM: CT ANGIOGRAPHY HEAD AND NECK CT PERFUSION BRAIN TECHNIQUE: Multidetector CT imaging of the head and neck was performed using the standard protocol during bolus administration of intravenous contrast. Multiplanar CT image reconstructions and MIPs were obtained to evaluate the vascular anatomy. Carotid stenosis measurements (when applicable) are obtained utilizing NASCET criteria, using the distal internal carotid diameter as the denominator. Multiphase CT imaging of the brain was performed following IV bolus contrast injection. Subsequent parametric perfusion maps were calculated using RAPID  software. CONTRAST:  185mL OMNIPAQUE IOHEXOL 350 MG/ML SOLN COMPARISON:  05/01/2019 CT head FINDINGS: CTA NECK FINDINGS Aortic arch: Left vertebral artery arises from the aorta. Imaged portion shows no evidence of aneurysm or dissection. No significant stenosis of the major arch vessel origins. Aortic calcific atherosclerosis. Right carotid system: No evidence of dissection, stenosis (50% or greater) or occlusion. Left carotid system: Mixed plaque of left carotid bifurcation with moderate 50-70% proximal ICA stenosis. Vertebral arteries: Codominant. No evidence of dissection, stenosis (50% or greater) or occlusion. Mild stenosis of proximal right V2. calcified plaque of left vertebral artery origin with mild stenosis. Skeleton: No acute finding. Other neck: Negative. Upper chest: Centrilobular emphysema. Review of the MIP images confirms the above findings CTA HEAD FINDINGS Anterior circulation: Calcific atherosclerosis of the carotid siphons with moderate right and mild left paraclinoid ICA stenosis. Moderate right A2 and mild left A2 stenosis. Otherwise no significant stenosis, proximal occlusion, aneurysm, or vascular malformation. Posterior circulation: Moderate right P1 and mild left P1 stenosis. Upper basilar mild stenosis. Otherwise no significant stenosis, proximal occlusion, aneurysm, or vascular malformation. Venous sinuses: As permitted by contrast timing, patent. Anatomic variants: None significant. Review of the MIP images confirms the above findings CT Brain Perfusion Findings: ASPECTS: 10 CBF (<30%) Volume: 57mL Perfusion (Tmax>6.0s) volume: 50mL Mismatch Volume: 62mL Infarction Location:Not applicable. IMPRESSION: CT brain perfusion: 1. No acute stroke or significant oligemia by standard perfusion criteria. ASPECTS is 10. CTA head: 1. No large vessel occlusion, aneurysm, or vascular malformation identified. 2. Extensive intracranial atherosclerosis with multiple areas of mild-to-moderate stenosis in the  anterior and posterior circulations. CTA neck: 1. Patent carotid and vertebral arteries. No dissection, occlusion, or aneurysm. 2. Left proximal ICA moderate 50-70% stenosis with mixed plaque. 3. Segments of mild stenosis of proximal bilateral vertebral arteries. 4. Aortic Atherosclerosis (ICD10-I70.0) and Emphysema (ICD10-J43.9). These results were called by telephone at the time of interpretation on 05/01/2019 at 2:37 am to Dr. Roland Rack , who verbally acknowledged these results. Electronically Signed   By: Kristine Garbe M.D.   On: 05/01/2019 02:41   Ct Head Code Stroke Wo Contrast  Result Date: 05/01/2019 CLINICAL DATA:  Code stroke. 83 y/o F; left-sided weakness and slurred speech. EXAM: CT HEAD WITHOUT CONTRAST TECHNIQUE: Contiguous axial images were obtained from the base of the skull through the vertex without intravenous contrast. COMPARISON:  08/29/2018 CT head. FINDINGS: Brain: Interval small age-indeterminate infarcts are present within the right caudate head and left cerebellar hemisphere. Stable small chronic infarct within the left anterior putamen. No large acute vascular territory infarction, hemorrhage, mass effect, extra-axial collection, hydrocephalus, or herniation. Stable nonspecific white matter hypodensities compatible with chronic microvascular ischemic changes and stable volume loss of the brain. Vascular: Calcific atherosclerosis of carotid siphons and vertebral arteries. No hyperdense vessel identified. Skull: Normal. Negative for  fracture or focal lesion. Sinuses/Orbits: Mucosal thickening of the right maxillary sinus and right anterior ethmoid air cells. Chronic inflammatory changes of the walls of the right maxillary sinus. Opacification of bilateral mastoid air cells. Orbits are unremarkable. Other: None. ASPECTS [facility] Early CT Score) - Ganglionic level infarction (caudate, lentiform nuclei, internal capsule, insula, M1-M3 cortex): 7 -  Supraganglionic infarction (M4-M6 cortex): 3 Total score (0-10 with 10 being normal): 10 IMPRESSION: 1. Interval small age-indeterminate infarcts within the right caudate head and left cerebellar hemisphere. 2. Otherwise no large acute stroke, hemorrhage, or mass effect identified. ASPECTS is 10. 3. Stable chronic microvascular ischemic changes and parenchymal volume loss of the brain. These results were called by telephone at the time of interpretation on 05/01/2019 at 2:06 am to Dr. Leonel Ramsay, who verbally acknowledged these results. Electronically Signed   By: Kristine Garbe M.D.   On: 05/01/2019 02:06       Medical Problem List and Plan: 1.  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  Antithrombotics: -DVT/anticoagulation:  Pharmaceutical: Lovenox  -antiplatelet therapy: ASA/Plavix X 3 weeks followed by ASA.  3. Headaches/Chronic back pain/Pain Management:   -discussed with the patient that while we want to help control her pain, that we need to be careful not to oversedate her or cause respiratory depression.  -Will change gabapentin to 100 mg HS.   -Will try tramadol prn with local measures including heat  -Lidocain patches/heat/ice. Oxycodone not daily use per records/PMP aware reviewed.    4. Mood: LCSW to follow for evaluation and support.   -antipsychotic agents: N/A 5. Neuropsych: This patient is not capable of making decisions on her own behalf. 6. Skin/Wound Care: LCSW tio follow for evaluation and support.  7. Fluids/Electrolytes/Nutrition: Monitor I/O. Check lytes in am.  8. HTN: Monitor BP bid--permissive HTN and normalize in 5-7 days.  9. COPD./chronic hypoxemic respiratory failure: Question maintainence medications. Oxygen dependent.  -OOB, IS 10. Acute on chronic renal failure?: Will check lytes in am. Encourage fluid intake.  11. H/o depression: Continue cymbalta.  12. L-ICA stenosis: To follow up  with vascular after discharge.  13. H/o B12 deficiency/Iron deficiency anemia: Hgb 8.4 at admission with mild leucocytosis. Will check CBC in am and order FOBT. Add iron supplement.  14. Dyslipidemia: Now on Lipitor.      Bary Leriche, PA-C 05/02/2019

## 2019-05-02 NOTE — Progress Notes (Signed)
[facility] PROGRESS NOTE  [name] UYQ:[id number] DOB: 10/14/1936 DOA: 05/01/2019 PCP: [name], MD  Assessment/Plan: Stroke [facility]): CT-head showed interval small age-indeterminate infarcts within the right caudate head and left cerebellar hemisphere. CTA of head and neck and cerebral perfusion revealed no LVO or acute stroke, but showed left proximal ICA moderate 50-70% stenosis with mixed plaque. MRI right pontine infarct secondary to small vessel disease. Echo with EF 60%, LDL 106, HgA1c 6.2 . Plan is to continue plavix and 81mg  asa for 3 weeks then asa alone. Continue statin  Normocytic anemia: Hgb 11.7 on 09/03/18-->8.4 on admission. Anemia panel with iron 21 and RBC 3.05 and immature retic 18.8 otherwise within limits of normal -check fobt -monitor  Hypokalemia: K=3.4 will -repleated -will recheck in am  Hypertension: fair control.  - continue to hold amlodipine to allow permissive HTN in the setting of acute stroke  -IV Hydralazine as needed for SBP.220 or dBP.120  COPD and dhronic respiratory failure: appears stable at baseline. Oxygen saturation level greater 90% on room air.  -Atrovent inhaler and prn albuterol nebs, prn mucinex -Continue nasal cannula oxygen to maintain oxygen saturation above 93% -monitor  Depression: appears anxious and somewhat irritable today -continue home Cymbalta  Acute renal failure superimposed on stage 3 chronic kidney disease ([place]): Baseline Cre is <1.0 , pt's Cre is 1.30 and BUN 43 on admission. Likely due to dehydration. - IVF: NS 100 cc/h - bmet in am    Code Status: full Family Communication: left son voice mail Disposition Plan: inpatient rehab hopefully tomorrow   Consultants:  [facility] neurology  Procedures:  echo  Antibiotics:    HPI/Subjective: 83 yo hx copd on home oxygen, htn, chronic back pain, CKDIII admitted acute stroke. Recommended inpatient rehab.   Objective: Vitals:   05/02/19 0323  05/02/19 0829  BP: (!) 142/68 140/73  Pulse: 83 96  Resp: 12   Temp: 99.1 F (37.3 C) 98.9 F (37.2 C)  SpO2: 99% 100%    Intake/Output Summary (Last 24 hours) at 05/02/2019 1047 Last data filed at 05/02/2019 0600 Gross per 24 hour  Intake 750 ml  Output 1250 ml  Net -500 ml   Filed Weights   05/01/19 0435  Weight: 60.8 kg    Exam:   General:  Awake alert slightly anxious no acute distress  Cardiovascular: rrr no mgr no LE edema  Respiratory: normal effort BS distant but clear  Abdomen: non-distended non-tender +BS no guarding or rebounding  Musculoskeletal: joints without swelling/erythema  Neuro: oriented x3. Left facial droop, moderate dysarthria, left grip 1/5 and right grip 4/5 LLE strength 1/5 RLE strenght 4/5  Data Reviewed: Basic Metabolic Panel: Recent Labs  Lab 05/01/19 0152 05/01/19 0201  NA 142  --   K 3.4*  --   CL 103  --   CO2 30  --   GLUCOSE 116*  --   BUN 43*  --   CREATININE 1.39* 1.30*  CALCIUM 8.7*  --    Liver Function Tests: Recent Labs  Lab 05/01/19 0152  AST 15  ALT 11  ALKPHOS 65  BILITOT 0.2*  PROT 6.1*  ALBUMIN 2.9*   No results for input(s): LIPASE, AMYLASE in the last 168 hours. No results for input(s): AMMONIA in the last 168 hours. CBC: Recent Labs  Lab 05/01/19 0152  WBC 10.1  NEUTROABS 7.0  HGB 8.4*  HCT 28.5*  MCV 89.1  PLT 499*   Cardiac Enzymes: No results for input(s): CKTOTAL, CKMB,  CKMBINDEX, TROPONINI in the last 168 hours. BNP (last 3 results) Recent Labs    06/09/18 0809  BNP 33.4    ProBNP (last 3 results) No results for input(s): PROBNP in the last 8760 hours.  CBG: Recent Labs  Lab 05/01/19 0251  GLUCAP 127*    No results found for this or any previous visit (from the past 240 hour(s)).   Studies: Ct Angio Head W Or Wo Contrast  Result Date: 05/01/2019 CLINICAL DATA:  83 y/o  F; left-sided weakness and slurred speech. EXAM: CT ANGIOGRAPHY HEAD AND NECK CT PERFUSION BRAIN  TECHNIQUE: Multidetector CT imaging of the head and neck was performed using the standard protocol during bolus administration of intravenous contrast. Multiplanar CT image reconstructions and MIPs were obtained to evaluate the vascular anatomy. Carotid stenosis measurements (when applicable) are obtained utilizing NASCET criteria, using the distal internal carotid diameter as the denominator. Multiphase CT imaging of the brain was performed following IV bolus contrast injection. Subsequent parametric perfusion maps were calculated using RAPID software. CONTRAST:  160mL OMNIPAQUE IOHEXOL 350 MG/ML SOLN COMPARISON:  05/01/2019 CT head FINDINGS: CTA NECK FINDINGS Aortic arch: Left vertebral artery arises from the aorta. Imaged portion shows no evidence of aneurysm or dissection. No significant stenosis of the major arch vessel origins. Aortic calcific atherosclerosis. Right carotid system: No evidence of dissection, stenosis (50% or greater) or occlusion. Left carotid system: Mixed plaque of left carotid bifurcation with moderate 50-70% proximal ICA stenosis. Vertebral arteries: Codominant. No evidence of dissection, stenosis (50% or greater) or occlusion. Mild stenosis of proximal right V2. calcified plaque of left vertebral artery origin with mild stenosis. Skeleton: No acute finding. Other neck: Negative. Upper chest: Centrilobular emphysema. Review of the MIP images confirms the above findings CTA HEAD FINDINGS Anterior circulation: Calcific atherosclerosis of the carotid siphons with moderate right and mild left paraclinoid ICA stenosis. Moderate right A2 and mild left A2 stenosis. Otherwise no significant stenosis, proximal occlusion, aneurysm, or vascular malformation. Posterior circulation: Moderate right P1 and mild left P1 stenosis. Upper basilar mild stenosis. Otherwise no significant stenosis, proximal occlusion, aneurysm, or vascular malformation. Venous sinuses: As permitted by contrast timing, patent.  Anatomic variants: None significant. Review of the MIP images confirms the above findings CT Brain Perfusion Findings: ASPECTS: 10 CBF (<30%) Volume: 105mL Perfusion (Tmax>6.0s) volume: 46mL Mismatch Volume: 58mL Infarction Location:Not applicable. IMPRESSION: CT brain perfusion: 1. No acute stroke or significant oligemia by standard perfusion criteria. ASPECTS is 10. CTA head: 1. No large vessel occlusion, aneurysm, or vascular malformation identified. 2. Extensive intracranial atherosclerosis with multiple areas of mild-to-moderate stenosis in the anterior and posterior circulations. CTA neck: 1. Patent carotid and vertebral arteries. No dissection, occlusion, or aneurysm. 2. Left proximal ICA moderate 50-70% stenosis with mixed plaque. 3. Segments of mild stenosis of proximal bilateral vertebral arteries. 4. Aortic Atherosclerosis (ICD10-I70.0) and Emphysema (ICD10-J43.9). These results were called by telephone at the time of interpretation on 05/01/2019 at 2:37 am to Dr. Roland Rack , who verbally acknowledged these results. Electronically Signed   By: Kristine Garbe M.D.   On: 05/01/2019 02:41   Ct Angio Neck W Or Wo Contrast  Result Date: 05/01/2019 CLINICAL DATA:  83 y/o  F; left-sided weakness and slurred speech. EXAM: CT ANGIOGRAPHY HEAD AND NECK CT PERFUSION BRAIN TECHNIQUE: Multidetector CT imaging of the head and neck was performed using the standard protocol during bolus administration of intravenous contrast. Multiplanar CT image reconstructions and MIPs were obtained to evaluate the vascular anatomy. Carotid  stenosis measurements (when applicable) are obtained utilizing NASCET criteria, using the distal internal carotid diameter as the denominator. Multiphase CT imaging of the brain was performed following IV bolus contrast injection. Subsequent parametric perfusion maps were calculated using RAPID software. CONTRAST:  119mL OMNIPAQUE IOHEXOL 350 MG/ML SOLN COMPARISON:  05/01/2019 CT  head FINDINGS: CTA NECK FINDINGS Aortic arch: Left vertebral artery arises from the aorta. Imaged portion shows no evidence of aneurysm or dissection. No significant stenosis of the major arch vessel origins. Aortic calcific atherosclerosis. Right carotid system: No evidence of dissection, stenosis (50% or greater) or occlusion. Left carotid system: Mixed plaque of left carotid bifurcation with moderate 50-70% proximal ICA stenosis. Vertebral arteries: Codominant. No evidence of dissection, stenosis (50% or greater) or occlusion. Mild stenosis of proximal right V2. calcified plaque of left vertebral artery origin with mild stenosis. Skeleton: No acute finding. Other neck: Negative. Upper chest: Centrilobular emphysema. Review of the MIP images confirms the above findings CTA HEAD FINDINGS Anterior circulation: Calcific atherosclerosis of the carotid siphons with moderate right and mild left paraclinoid ICA stenosis. Moderate right A2 and mild left A2 stenosis. Otherwise no significant stenosis, proximal occlusion, aneurysm, or vascular malformation. Posterior circulation: Moderate right P1 and mild left P1 stenosis. Upper basilar mild stenosis. Otherwise no significant stenosis, proximal occlusion, aneurysm, or vascular malformation. Venous sinuses: As permitted by contrast timing, patent. Anatomic variants: None significant. Review of the MIP images confirms the above findings CT Brain Perfusion Findings: ASPECTS: 10 CBF (<30%) Volume: 15mL Perfusion (Tmax>6.0s) volume: 50mL Mismatch Volume: 56mL Infarction Location:Not applicable. IMPRESSION: CT brain perfusion: 1. No acute stroke or significant oligemia by standard perfusion criteria. ASPECTS is 10. CTA head: 1. No large vessel occlusion, aneurysm, or vascular malformation identified. 2. Extensive intracranial atherosclerosis with multiple areas of mild-to-moderate stenosis in the anterior and posterior circulations. CTA neck: 1. Patent carotid and vertebral  arteries. No dissection, occlusion, or aneurysm. 2. Left proximal ICA moderate 50-70% stenosis with mixed plaque. 3. Segments of mild stenosis of proximal bilateral vertebral arteries. 4. Aortic Atherosclerosis (ICD10-I70.0) and Emphysema (ICD10-J43.9). These results were called by telephone at the time of interpretation on 05/01/2019 at 2:37 am to Dr. Roland Rack , who verbally acknowledged these results. Electronically Signed   By: Kristine Garbe M.D.   On: 05/01/2019 02:41   Mr Brain Wo Contrast  Result Date: 05/01/2019 CLINICAL DATA:  Left-sided weakness and slurred speech EXAM: MRI HEAD WITHOUT CONTRAST TECHNIQUE: Multiplanar, multiecho pulse sequences of the brain and surrounding structures were obtained without intravenous contrast. COMPARISON:  CTA head neck from earlier today FINDINGS: Brain: Moderate acute infarction in the right pons respecting the midline. No prior infarct is seen. There is minimal chronic small vessel ischemic type change in the cerebral white matter. Brain volume is normal. No acute hemorrhage, hydrocephalus, or masslike finding. Vascular: Major flow voids are preserved. CTA was performed earlier today Skull and upper cervical spine: Negative for marrow lesion. Degenerative cervical facet spurring. Sinuses/Orbits: Bilateral mastoid opacification with negative nasopharynx. Mastoid opacification was also seen on head CT 1 year prior. Other: Intermittent motion degradation. IMPRESSION: Acute right pontine infarction. Electronically Signed   By: Monte Fantasia M.D.   On: 05/01/2019 04:28   Ct Cerebral Perfusion W Contrast  Result Date: 05/01/2019 CLINICAL DATA:  83 y/o  F; left-sided weakness and slurred speech. EXAM: CT ANGIOGRAPHY HEAD AND NECK CT PERFUSION BRAIN TECHNIQUE: Multidetector CT imaging of the head and neck was performed using the standard protocol during bolus administration of  intravenous contrast. Multiplanar CT image reconstructions and MIPs were  obtained to evaluate the vascular anatomy. Carotid stenosis measurements (when applicable) are obtained utilizing NASCET criteria, using the distal internal carotid diameter as the denominator. Multiphase CT imaging of the brain was performed following IV bolus contrast injection. Subsequent parametric perfusion maps were calculated using RAPID software. CONTRAST:  162mL OMNIPAQUE IOHEXOL 350 MG/ML SOLN COMPARISON:  05/01/2019 CT head FINDINGS: CTA NECK FINDINGS Aortic arch: Left vertebral artery arises from the aorta. Imaged portion shows no evidence of aneurysm or dissection. No significant stenosis of the major arch vessel origins. Aortic calcific atherosclerosis. Right carotid system: No evidence of dissection, stenosis (50% or greater) or occlusion. Left carotid system: Mixed plaque of left carotid bifurcation with moderate 50-70% proximal ICA stenosis. Vertebral arteries: Codominant. No evidence of dissection, stenosis (50% or greater) or occlusion. Mild stenosis of proximal right V2. calcified plaque of left vertebral artery origin with mild stenosis. Skeleton: No acute finding. Other neck: Negative. Upper chest: Centrilobular emphysema. Review of the MIP images confirms the above findings CTA HEAD FINDINGS Anterior circulation: Calcific atherosclerosis of the carotid siphons with moderate right and mild left paraclinoid ICA stenosis. Moderate right A2 and mild left A2 stenosis. Otherwise no significant stenosis, proximal occlusion, aneurysm, or vascular malformation. Posterior circulation: Moderate right P1 and mild left P1 stenosis. Upper basilar mild stenosis. Otherwise no significant stenosis, proximal occlusion, aneurysm, or vascular malformation. Venous sinuses: As permitted by contrast timing, patent. Anatomic variants: None significant. Review of the MIP images confirms the above findings CT Brain Perfusion Findings: ASPECTS: 10 CBF (<30%) Volume: 46mL Perfusion (Tmax>6.0s) volume: 29mL Mismatch Volume:  65mL Infarction Location:Not applicable. IMPRESSION: CT brain perfusion: 1. No acute stroke or significant oligemia by standard perfusion criteria. ASPECTS is 10. CTA head: 1. No large vessel occlusion, aneurysm, or vascular malformation identified. 2. Extensive intracranial atherosclerosis with multiple areas of mild-to-moderate stenosis in the anterior and posterior circulations. CTA neck: 1. Patent carotid and vertebral arteries. No dissection, occlusion, or aneurysm. 2. Left proximal ICA moderate 50-70% stenosis with mixed plaque. 3. Segments of mild stenosis of proximal bilateral vertebral arteries. 4. Aortic Atherosclerosis (ICD10-I70.0) and Emphysema (ICD10-J43.9). These results were called by telephone at the time of interpretation on 05/01/2019 at 2:37 am to Dr. Roland Rack , who verbally acknowledged these results. Electronically Signed   By: Kristine Garbe M.D.   On: 05/01/2019 02:41   Ct Head Code Stroke Wo Contrast  Result Date: 05/01/2019 CLINICAL DATA:  Code stroke. 83 y/o F; left-sided weakness and slurred speech. EXAM: CT HEAD WITHOUT CONTRAST TECHNIQUE: Contiguous axial images were obtained from the base of the skull through the vertex without intravenous contrast. COMPARISON:  08/29/2018 CT head. FINDINGS: Brain: Interval small age-indeterminate infarcts are present within the right caudate head and left cerebellar hemisphere. Stable small chronic infarct within the left anterior putamen. No large acute vascular territory infarction, hemorrhage, mass effect, extra-axial collection, hydrocephalus, or herniation. Stable nonspecific white matter hypodensities compatible with chronic microvascular ischemic changes and stable volume loss of the brain. Vascular: Calcific atherosclerosis of carotid siphons and vertebral arteries. No hyperdense vessel identified. Skull: Normal. Negative for fracture or focal lesion. Sinuses/Orbits: Mucosal thickening of the right maxillary sinus and  right anterior ethmoid air cells. Chronic inflammatory changes of the walls of the right maxillary sinus. Opacification of bilateral mastoid air cells. Orbits are unremarkable. Other: None. ASPECTS [facility] Stroke Program Early CT Score) - Ganglionic level infarction (caudate, lentiform nuclei, internal capsule, insula, M1-M3 cortex): 7 -  Supraganglionic infarction (M4-M6 cortex): 3 Total score (0-10 with 10 being normal): 10 IMPRESSION: 1. Interval small age-indeterminate infarcts within the right caudate head and left cerebellar hemisphere. 2. Otherwise no large acute stroke, hemorrhage, or mass effect identified. ASPECTS is 10. 3. Stable chronic microvascular ischemic changes and parenchymal volume loss of the brain. These results were called by telephone at the time of interpretation on 05/01/2019 at 2:06 am to Dr. Leonel Ramsay, who verbally acknowledged these results. Electronically Signed   By: Kristine Garbe M.D.   On: 05/01/2019 02:06    Scheduled Meds: . aspirin EC  81 mg Oral Daily  . atorvastatin  20 mg Oral q1800  . clopidogrel  75 mg Oral Daily  . docusate sodium  250 mg Oral Daily  . DULoxetine  60 mg Oral Daily  . enoxaparin (LOVENOX) injection  40 mg Subcutaneous Q24H  . cyanocobalamin  1,000 mcg Oral Daily   Continuous Infusions: . sodium chloride 50 mL/hr at 05/01/19 1752    Principal Problem:   Stroke [facility]) Active Problems:   Hypokalemia   Hypertension   Acute renal failure superimposed on stage 3 chronic kidney disease ([facility])   Normocytic anemia   COPD (chronic obstructive pulmonary disease) ([facility])   Chronic respiratory failure ([place])   Depression    Time spent: 56 minutes    Emery NP  [facility]  If 7PM-7AM, please contact night-coverage at [email or web address], password [facility] 05/02/2019, 10:47 AM  LOS: 1 day

## 2019-05-02 NOTE — Progress Notes (Signed)
Patient ID: [name], female   DOB: 06/23/1936, 83 y.o.   MRN: [id number] Patient arrived from [id number] with RN, NT, and patient belongings. Patient set-up with 2L, O2, [place]. Oriented to rehab process, fall prevention plan, safety plan, health resource notebook, and schedule with verbal understanding. Patient resting in bed with bed alarm on and call bell at patient side.

## 2019-05-02 NOTE — TOC Transition Note (Signed)
Transition of Care [facility]) - CM/SW Discharge Note   Patient Details  Name: [name] MRN: [id number] Date of Birth: 1936-05-26  Transition of Care [facility]) CM/SW Contact:  Pollie Friar, RN Phone Number: 05/02/2019, 2:58 PM   Clinical Narrative:    Pt discharging to CIR today. CM signing off.    Final next level of care: IP Rehab Facility Barriers to Discharge: Barriers Resolved   Patient Goals and CMS Choice Patient states their goals for this hospitalization and ongoing recovery are:: to get back home      Discharge Placement                       Discharge Plan and Services   Discharge Planning Services: CM Consult                                 Social Determinants of Health (SDOH) Interventions     Readmission Risk Interventions Readmission Risk Prevention Plan 05/01/2019 08/22/2018  Post Dischage Appt - Patient refused  Medication Screening - Complete  Transportation Screening Complete Complete  PCP follow-up - Patient refused  Medication Review (RN Care Manager) Referral to Pharmacy -  Some recent data might be hidden

## 2019-05-02 NOTE — Progress Notes (Signed)
Meredith Staggers, MD  Physician  Physical Medicine and Rehabilitation  PMR Pre-admission  Signed  Date of Service:  05/02/2019 2:39 PM       Related encounter: ED to Hosp-Admission (Discharged) from 05/01/2019 in [facility]      Signed         Show:Clear all _0 Manual_1 Template_2 Copied  Added by: _3 Cristina Gong, RN_4 Meredith Staggers, MD  _5 Hover for details PMR Admission Coordinator Pre-Admission Assessment  Patient: [name] is an 83 y.o., female MRN: [id number] DOB: 10-24-1936 Height: _6  (144.8 cm) Weight: 60.8 kg  Insurance Information HMO: yes    PPO:      PCP:      IPA:      80/20:      OTHER: medicare advantage plan PRIMARY: U[employer] Medicare      Policy#: [id number]      Subscriber: pt CM Name: Covid automated approval      Phone#: [phone]     Fax#: [phone] Pre-Cert#: [id number]  F/u with Dorthula Nettles in 3 days    Employer:  Benefits:  Phone #: [phone]     Name:  Eff. Date: 04/28/2019     Deduct: none      Out of Pocket Max: $3600      Life Max: none CIR: $295 co pay per day days 1 until 5      SNF: no co pay per day days 1 until 20; $160 co pay per day days 21 until 43; no co pay days 44 until 100 Outpatient: $30 per visit     Co-Pay: visits per medical neccesity Home Health: 100%      Co-Pay: visits per medical neccesity DME: 80%     Co-Pay: 20% Providers: in network  SECONDARY: none        Medicaid Application Date:       Case Manager:  Disability Application Date:       Case Worker:   The Data Collection Information Summary for patients in Inpatient Rehabilitation Facilities with attached Privacy Act [place] Records was provided and verbally reviewed with: Patient and Family  Emergency Contact Information         Contact Information    Name Relation Home Work Germania Son [phone]     Paulino Door [phone]        Current Medical History   Patient Admitting Diagnosis: right pontine infarct  History of Present Illness:[name] is an 83 year old female with history of COPD- oxygen dependent, HTN, chronic back pain who was admitted on 05/01/19 with slurred speech, left sided weakness and difficulty walking. 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 MRI brain showed acute right pontine infarct. 2D echo with bubble study showed EF 60-65% with normal systolic function and  Dr. Erlinda Hong felt that stroke was due to small vessel disease and recommended DAPT followed by ASA alone and outpatient follow up with [facility] for monitoring of asymptomatic L-ICA stenosis.  Patient with resultant left sided weakness with poor postural control at EOB, mild cognitive deficits and moderate flaccid dysarthria affecting mobility and self care.   Complete NIHSS TOTAL: 10  Patient's medical record from [facility]  has been reviewed by the rehabilitation admission coordinator and physician.  Past Medical History      Past Medical History:  Diagnosis Date   Chronic back pain  Chronic back pain 08/22/2018   Colitis 03/19/2014   COPD (chronic obstructive pulmonary disease) ([place])    Hypertension    On home oxygen therapy 06-14-13   continuos 2.5l/m nasally-24/7   Osteoporosis    Oxygen dependent     Family History   family history includes Diabetes Mellitus II in her sister; Hypertension in her sister.  Prior Rehab/Hospitalizations Has the patient had prior rehab or hospitalizations prior to admission? Yes  Has the patient had major surgery during 100 days prior to admission? No              Current Medications  Current Facility-Administered Medications:    0.9 %  sodium chloride infusion, , Intravenous, Continuous, Rosalin Hawking, MD, Last Rate: 50 mL/hr at 05/01/19 1752   acetaminophen (TYLENOL) tablet  650 mg, 650 mg, Oral, Q4H PRN, 650 mg at 05/02/19 1428 **OR** acetaminophen (TYLENOL) solution 650 mg, 650 mg, Per Tube, Q4H PRN **OR** acetaminophen (TYLENOL) suppository 650 mg, 650 mg, Rectal, Q4H PRN, Ivor Costa, MD   albuterol (PROVENTIL) (2.5 MG/3ML) 0.083% nebulizer solution 2.5 mg, 2.5 mg, Nebulization, Q4H PRN, Ivor Costa, MD   aspirin EC tablet 81 mg, 81 mg, Oral, Daily, Rosalin Hawking, MD, 81 mg at 05/02/19 1043   atorvastatin (LIPITOR) tablet 20 mg, 20 mg, Oral, q1800, Rosalin Hawking, MD, 20 mg at 05/01/19 1633   clopidogrel (PLAVIX) tablet 75 mg, 75 mg, Oral, Daily, Rosalin Hawking, MD, 75 mg at 05/02/19 1043   dextromethorphan-guaiFENesin (Jenkins DM) 30-600 MG per 12 hr tablet 1 tablet, 1 tablet, Oral, BID PRN, Ivor Costa, MD   docusate sodium (COLACE) capsule 250 mg, 250 mg, Oral, Daily, Ivor Costa, MD, 250 mg at 05/01/19 6045   DULoxetine (CYMBALTA) DR capsule 60 mg, 60 mg, Oral, Daily, Ivor Costa, MD, 60 mg at 05/02/19 1043   enoxaparin (LOVENOX) injection 40 mg, 40 mg, Subcutaneous, Q24H, Ivor Costa, MD, 40 mg at 05/02/19 0602   gabapentin (NEURONTIN) capsule 100-300 mg, 100-300 mg, Oral, QHS PRN, Ivor Costa, MD, 100 mg at 05/01/19 2129   hydrALAZINE (APRESOLINE) injection 5 mg, 5 mg, Intravenous, Q2H PRN, Ivor Costa, MD, 5 mg at 05/01/19 0847   ondansetron (ZOFRAN) injection 4 mg, 4 mg, Intravenous, Q8H PRN, Ivor Costa, MD   oxyCODONE (Oxy IR/ROXICODONE) immediate release tablet 5 mg, 5 mg, Oral, Q4H PRN, Barb Merino, MD, 5 mg at 05/02/19 1043   senna-docusate (Senokot-S) tablet 1 tablet, 1 tablet, Oral, QHS PRN, Ivor Costa, MD   vitamin B-12 (CYANOCOBALAMIN) tablet 1,000 mcg, 1,000 mcg, Oral, Daily, Ivor Costa, MD, 1,000 mcg at 05/02/19 1043  Patients Current Diet:     Diet Order                  Diet Heart Room service appropriate? No; Fluid consistency: Thin  Diet effective now               Precautions / Restrictions Precautions Precautions:  Fall Restrictions Weight Bearing Restrictions: No   Has the patient had 2 or more falls or a fall with injury in the past year? No  Prior Activity Level Limited Community (1-2x/wk): decline in fucntion over past few weeks. Was mod I RW priro to that time  Prior Functional Level Self Care: Did the patient need help bathing, dressing, using the toilet or eating? Needed some help  Indoor Mobility: Did the patient need assistance with walking from room to room (with or without device)? Needed some help  Stairs: Did the patient  need assistance with internal or external stairs (with or without device)? Needed some help  Functional Cognition: Did the patient need help planning regular tasks such as shopping or remembering to take medications? Needed some help  Home Assistive Devices / [place] Devices/Equipment: Wheelchair Home Equipment: Environmental consultant - 2 wheels, [place] - single point, Bedside commode, Walker - 4 wheels  Prior Device Use: Indicate devices/aids used by the patient prior to current illness, exacerbation or injury? Walker  Current Functional Level Cognition  Arousal/Alertness: Awake/alert Overall Cognitive Status: Impaired/Different from baseline Current Attention Level: Sustained Orientation Level: Oriented to place, Oriented to person, Oriented to situation Following Commands: Follows one step commands inconsistently, Follows one step commands with increased time Safety/Judgement: Decreased awareness of safety, Decreased awareness of deficits General Comments: Asked multiple times "What's wrong with me?" and required redirection to situation. Poor awareness of safety, thinking she could walk after it took 2 people to transfer to the chair only, Attention: Selective, Alternating Selective Attention: Appears intact Alternating Attention: Impaired Alternating Attention Impairment: Verbal complex Memory: Impaired Memory Impairment: Storage deficit,  Decreased recall of new information, Retrieval deficit Awareness: Impaired Awareness Impairment: Emergent impairment Problem Solving: Appears intact Executive Function: Organizing, Self Monitoring Organizing: Impaired Organizing Impairment: Verbal complex Self Monitoring: Impaired Self Monitoring Impairment: Verbal complex, Functional basic Behaviors: Restless Safety/Judgment: Impaired    Extremity Assessment (includes Sensation/Coordination)  Upper Extremity Assessment: RUE deficits/detail, LUE deficits/detail RUE Deficits / Details: only tolerating minimal movement at shoulder before reporting pain (reports it was not this way pta) RUE Coordination: decreased fine motor, decreased gross motor LUE Deficits / Details: only tolerating minimal movement at shoulder before reporting pain (reports it was not this way pta); sublux noted, minimal movement noted throughout LUE Coordination: decreased gross motor, decreased fine motor  Lower Extremity Assessment: LLE deficits/detail LLE Deficits / Details: Minimal active movement on eval    ADLs  Overall ADL's : Needs assistance/impaired Eating/Feeding: Total assistance Eating/Feeding Details (indicate cue type and reason): supported sitting--due to pain in Bil shoulders and decreased use of LUE from CVA Grooming: Total assistance Grooming Details (indicate cue type and reason): supported sitting--due to pain in Bil shoulders and decreased use of LUE from CVA Upper Body Bathing: Total assistance Upper Body Bathing Details (indicate cue type and reason): supported sitting--due to pain in Bil shoulders and decreased use of LUE from CVA Lower Body Bathing: Total assistance Lower Body Bathing Details (indicate cue type and reason): Max A +2 sit<>stand Upper Body Dressing : Total assistance Upper Body Dressing Details (indicate cue type and reason): supported sitting--due to pain in Bil shoulders and decreased use of LUE from CVA Lower Body  Dressing: Total assistance Lower Body Dressing Details (indicate cue type and reason): Max A +2 sit<>stand Toilet Transfer: Maximal assistance, +2 for physical assistance, Control and instrumentation engineer Details (indicate cue type and reason): bed>recliner going to pt's right Toileting- Clothing Manipulation and Hygiene: Total assistance Toileting - Clothing Manipulation Details (indicate cue type and reason): Max A +2 sit<>stand    Mobility  Overal bed mobility: Needs Assistance Bed Mobility: Supine to Sit Supine to sit: Max assist, +2 for physical assistance General bed mobility comments: Assist for LE advancement towards EOB, scooting and trunk elevation to full sitting position. Bed pad utilized for scooting assist.     Transfers  Overall transfer level: Needs assistance Equipment used: 2 person hand held assist Transfer via Lift Equipment: Stedy Transfers: Sit to/from Stand Sit to Stand: Max assist, +2 physical assistance Stand  pivot transfers: Max assist, +2 physical assistance General transfer comment: +2 assist to power-up to full standing position. Increased multimodal cues to maximize hip extension enough to get Stedy flaps under her.     Ambulation / Gait / Stairs / Wheelchair Mobility  Ambulation/Gait General Gait Details: Pt was able to [employer] activity within [place]. Weight shifting x10, marching in place x2 each side with therapist assist to stabilize R knee during L march.     Posture / Balance Dynamic Sitting Balance Sitting balance - Comments: Required hands-on assist for sitting balance throughout EOB activity.  Balance Overall balance assessment: Needs assistance Sitting-balance support: Feet supported, No upper extremity supported Sitting balance-Leahy Scale: Poor Sitting balance - Comments: Required hands-on assist for sitting balance throughout EOB activity.  Postural control: Posterior lean Standing balance support: Bilateral upper extremity  supported, During functional activity Standing balance-Leahy Scale: Zero Standing balance comment: +2 required.     Special needs/care consideration BiPAP/CPAP  N/a CPM  N/a Continuous Drip IV  N/a Dialysis n/a Life Vest  N/a Oxygen  2 to 3 liters [place] Special Bed  N/a Trach Size  N/a Wound Vac n/a Skin  intact Bowel mgmt:  Incontinent LBM 5/3 Bladder mgmt: external catheter Diabetic mgmt: Hgb A1c 6.2 Behavioral consideration  N/a Chemo/radiation  N/a   Previous Home Environment  Living Arrangements: (son, Legrand Como)  Lives With: Leamon Arnt, lives with patient) Available Help at Discharge: Family, Available 24 hours/day Type of Home: House Home Layout: One level Home Access: Stairs to enter Entrance Stairs-Rails: Right Entrance Stairs-Number of Steps: 2 Bathroom Shower/Tub: Tub/shower unit, Architectural technologist: Standard Bathroom Accessibility: Yes How Accessible: Accessible via walker [place]: No Additional Comments: [name] has been asissting with bathing and dressing over past few weeks due to her decline in function  Discharge Living Setting Plans for Discharge Living Setting: Patient's home(son, [name], lives with pt) Type of Home at Discharge: House Discharge Home Layout: One level Discharge Home Access: Stairs to enter Entrance Stairs-Rails: Right Entrance Stairs-Number of Steps: 2 Discharge Bathroom Shower/Tub: Tub/shower unit, Curtain Discharge Bathroom Toilet: Standard Discharge Bathroom Accessibility: Yes How Accessible: Accessible via walker Does the patient have any problems obtaining your medications?: No  Social/Family/Support Systems Patient Roles: Parent Contact Information: [name], son and sister, Hassan Rowan Anticipated Caregiver: [name] Anticipated Caregiver's Contact Information: home number is [phone] cell is not working Ability/Limitations of Caregiver: no limitations Caregiver Availability: 24/7 Discharge Plan Discussed with  Primary Caregiver: Yes Is Caregiver In Agreement with Plan?: Yes Does Caregiver/Family have Issues with Lodging/Transportation while Pt is in Rehab?: No  Goals/Additional Needs Patient/Family Goal for Rehab: min assist PT and OT, supervision SLP Expected length of stay: ELOS 14 to 20 days Special Service Needs: Patient has long history of back pain issues Pt/Family Agrees to Admission and willing to participate: Yes Program Orientation Provided & Reviewed with Pt/Caregiver Including Roles  & Responsibilities: Yes  Decrease burden of Care through IP rehab admission: n/a  Possible need for SNF placement upon discharge: pt refuses  Patient Condition: I have reviewed medical records from [facility], spoken with CSW, and patient, son and family member. I met with patient at the bedside for inpatient rehabilitation assessment.  Patient will benefit from ongoing PT, OT and SLP, can actively participate in 3 hours of therapy a day 5 days of the week, and can make measurable gains during the admission.  Patient will also benefit from the coordinated team approach during an Inpatient Acute Rehabilitation admission.  The patient will receive intensive therapy as well as Rehabilitation physician, nursing, social worker, and care management interventions.  Due to bladder management, bowel management, safety, skin/wound care, disease management, medication administration, pain management and patient education the patient requires 24 hour a day rehabilitation nursing.  The patient is currently max assist with mobility and basic ADLs.  Discharge setting and therapy post discharge at home with home health is anticipated.  Patient has agreed to participate in the Acute Inpatient Rehabilitation Program and will admit today.  Preadmission Screen Completed By:  Cleatrice Burke RN MSN 05/02/2019 2:39 PM ______________________________________________________________________   Discussed status with  Dr. Naaman Plummer  on  05/02/2019 at  1452 and received approval for admission today.  Admission Coordinator:  Cleatrice Burke, RN MSN, time  [id number] Date  05/02/2019   Assessment/Plan:  Diagnosis: right pontine infarct 1. Does the need for close, 24 hr/day Medical supervision in concert with the patient's rehab needs make it unreasonable for this patient to be served in a less intensive setting? Yes 2. Co-Morbidities requiring supervision/potential complications: COPD, HTN 3. Due to bladder management, bowel management, safety, skin/wound care, disease management, medication administration, pain management and patient education, does the patient require 24 hr/day rehab nursing? Yes 4. Does the patient require coordinated care of a physician, rehab nurse, PT (1-2 hrs/day, 5 days/week), OT (1-2 hrs/day, 5 days/week) and SLP (1-2 hrs/day, 5 days/week) to address physical and functional deficits in the context of the above medical diagnosis(es)? Yes Addressing deficits in the following areas: balance, endurance, locomotion, strength, transferring, bowel/bladder control, bathing, dressing, feeding, grooming, toileting, cognition, speech, swallowing and psychosocial support 5. Can the patient actively participate in an intensive therapy program of at least 3 hrs of therapy 5 days a week? Yes 6. The potential for patient to make measurable gains while on inpatient rehab is excellent 7. Anticipated functional outcomes upon discharge from inpatients are: min assist PT, min assist OT, supervision SLP 8. Estimated rehab length of stay to reach the above functional goals is: 14-20 days 9. Anticipated D/C setting: Home 10. Anticipated post D/C treatments: [place] therapy 11. Overall Rehab/Functional Prognosis: excellent  MD Signature: Meredith Staggers, MD, [place] 05/02/2019         Revision History

## 2019-05-02 NOTE — H&P (Signed)
Physical Medicine and Rehabilitation Admission H&P     CC: Stroke with functional deficits.    HPI:  [name] is an 83 year old femle with history of COPD- oxygen dependent, HTN, chronic back pain who was admitted on 05/01/19 with slurred speech, left sided weakness and difficulty walking. 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 MRI brain showed acute right pontine infarct. 2D echo with bubble study showed EF 60-65% with normal systolic function and negative bubble study. Dr. Erlinda Hong felt that stroke was due to small vessel disease and recommended DAPT folowed by ASA alone and outpatient follow up with [facility] for monitoring of asymptomatic L-ICA stenosis.  Patient with resultant left sided weakness with poor postural control at EOB, mild cognitive deficits and moderate flaccid dysarthria affecting mobility and self care. CIR recommended due to functional deficits.       Review of Systems  Constitutional: Negative for chills and fever.  HENT: Positive for hearing loss.   Eyes: Negative for blurred vision and double vision.  Respiratory: Positive for wheezing. Negative for cough.   Cardiovascular: Negative for chest pain and palpitations.  Gastrointestinal: Positive for heartburn. Negative for nausea.  Genitourinary: Negative for dysuria and urgency.  Musculoskeletal: Positive for back pain (chronic), falls and myalgias.  Neurological: Positive for focal weakness and headaches (persistent). Negative for dizziness.      Past Medical History:  Diagnosis Date   CAP (community acquired pneumonia)      multiple admissions   Chronic back pain 08/22/2018   Colitis 03/19/2014   Compression fx, lumbar spine (HCC)     COPD (chronic obstructive pulmonary disease) ([place])     Delirium     Hypertension     On home oxygen therapy 06-14-13    continuos 2.5l/m nasally-24/7    Osteoporosis     Sacral fracture ([place]) 2014           Past Surgical History:  Procedure Laterality Date   ABDOMINAL HYSTERECTOMY       ESOPHAGOGASTRODUODENOSCOPY N/A 06/15/2013    Procedure: ESOPHAGOGASTRODUODENOSCOPY (EGD);  Surgeon: [name], MD;  Location: [facility];  Service: Endoscopy;  Laterality: N/A;   IR GENERIC HISTORICAL   10/15/2016    IR KYPHO LUMBAR INC FX REDUCE BONE BX UNI/BIL CANNULATION INC/IMAGING 10/15/2016 Luanne Bras, MD [facility]   sacroplasty   06-14-13    05-10-13 IVR [facility] for fracture stabilization           Family History  Problem Relation Age of Onset   Hypertension Sister     Diabetes Mellitus II Sister        Social History:  Son lives with her and manages home. 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 Her smoking use included cigarettes. She has never used smokeless tobacco. She reports that she does not drink alcohol or use drugs.          Allergies  Allergen Reactions   Ambien [Zolpidem] Other (See Comments)      Hallucinations and paranoid          Medications Prior to Admission  Medication Sig Dispense Refill   acetaminophen (TYLENOL) 500 MG tablet Take 1 tablet (500 mg total) by mouth 3 (three) times daily. 30 tablet 0   albuterol (PROVENTIL HFA;VENTOLIN HFA) 108 (90 Base) MCG/ACT inhaler Inhale 1-2 puffs into the lungs  every 4 (four) hours as needed for wheezing or shortness of breath. 1 Inhaler 0   amLODipine (NORVASC) 5 MG tablet Take 1 tablet (5 mg total) by mouth daily. 30 tablet 0   diclofenac sodium (VOLTAREN) 1 % GEL Apply 2 g topically 4 (four) times daily.       docusate sodium (COLACE) 250 MG capsule Take 1 capsule (250 mg total) by mouth daily. 10 capsule 0   DULoxetine (CYMBALTA) 30 MG capsule Take 30-60 capsules by mouth See admin instructions. 1 capsule daily for 1 week, increase to 2 capsules therafter   0   feeding supplement, ENSURE ENLIVE, (ENSURE ENLIVE) LIQD  Take 237 mLs by mouth 2 (two) times daily between meals. 237 mL 12   gabapentin (NEURONTIN) 100 MG capsule Take 100-300 mg by mouth at bedtime as needed for pain.   0   lidocaine (LIDODERM) 5 % Place 1 patch onto the skin daily. Remove & Discard patch within 12 hours or as directed by MD 30 patch 0   oxyCODONE (OXY IR/ROXICODONE) 5 MG immediate release tablet Take 1 tablet (5 mg total) by mouth every 6 (six) hours as needed for breakthrough pain. 20 tablet 0   oxyCODONE-acetaminophen (PERCOCET/ROXICET) 5-325 MG tablet Take 1-2 tablets by mouth every 6 (six) hours as needed for severe pain.       polyethylene glycol (MIRALAX / GLYCOLAX) packet Take 17 g by mouth daily as needed for mild constipation. (Patient not taking: Reported on 08/18/2018) 14 each 0   senna (SENOKOT) 8.6 MG TABS tablet Take 1 tablet (8.6 mg total) by mouth 2 (two) times daily. 60 each 0   Syringe/Needle, Disp, (SYRINGE 3CC/22GX3/4") 22G X 3/4" 3 ML MISC 1,000 mcg by Does not apply route daily. 100 each 0   vitamin B-12 1000 MCG tablet Take 1 tablet (1,000 mcg total) by mouth daily. 30 tablet 0      Drug Regimen Review  Drug regimen was reviewed and remains appropriate with no significant issues identified   Home: Home Living Family/patient expects to be discharged to:: Private residence Living Arrangements: (son, Legrand Como) Available Help at Discharge: Family, Available 24 hours/day Type of Home: House Home Access: Stairs to enter [employer] of Steps: 2 Entrance Stairs-Rails: Right Home Layout: One level Bathroom Shower/Tub: Tub/shower unit, Architectural technologist: Standard Bathroom Accessibility: Yes Home Equipment: Environmental consultant - 2 wheels, Cane - single point, Bedside commode, Walker - 4 wheels Additional Comments: [name] has been asissting with bathing and dressing over past few weeks due to her decline in function  Lives With: Son(son, [name], lives with patient)   Functional History: Prior  Function Level of Independence: Needs assistance Gait / Transfers Assistance Needed: Pt using a rollator for ambulation ADL's / Homemaking Assistance Needed: Pt reports she was independent with BADL's but son assisted with IADL's.  Comments: pt states she does all BADLs and son helps with IADLs   Functional Status:  Mobility: Bed Mobility Overal bed mobility: Needs Assistance Bed Mobility: Supine to Sit Supine to sit: Max assist, +2 for physical assistance General bed mobility comments: Assist for LE advancement towards EOB, scooting and trunk elevation to full sitting position. Bed pad utilized for scooting assist.  Transfers Overall transfer level: Needs assistance Equipment used: 2 person hand held assist Transfer via Lift Equipment: Stedy Transfers: Sit to/from Stand Sit to Stand: Max assist, +2 physical assistance Stand pivot transfers: Max assist, +2 physical assistance General transfer comment: +2 assist to power-up to full standing position.  Increased multimodal cues to maximize hip extension enough to get Stedy flaps under her.  Ambulation/Gait General Gait Details: Pt was able to deomstrate pre-gait activity within [place]. Weight shifting x10, marching in place x2 each side with therapist assist to stabilize R knee during L march.    ADL: ADL Overall ADL's : Needs assistance/impaired Eating/Feeding: Total assistance Eating/Feeding Details (indicate cue type and reason): supported sitting--due to pain in Bil shoulders and decreased use of LUE from CVA Grooming: Total assistance Grooming Details (indicate cue type and reason): supported sitting--due to pain in Bil shoulders and decreased use of LUE from CVA Upper Body Bathing: Total assistance Upper Body Bathing Details (indicate cue type and reason): supported sitting--due to pain in Bil shoulders and decreased use of LUE from CVA Lower Body Bathing: Total assistance Lower Body Bathing Details (indicate cue type and reason):  Max A +2 sit<>stand Upper Body Dressing : Total assistance Upper Body Dressing Details (indicate cue type and reason): supported sitting--due to pain in Bil shoulders and decreased use of LUE from CVA Lower Body Dressing: Total assistance Lower Body Dressing Details (indicate cue type and reason): Max A +2 sit<>stand Toilet Transfer: Maximal assistance, +2 for physical assistance, Control and instrumentation engineer Details (indicate cue type and reason): bed>recliner going to pt's right Toileting- Clothing Manipulation and Hygiene: Total assistance Toileting - Clothing Manipulation Details (indicate cue type and reason): Max A +2 sit<>stand   Cognition: Cognition Overall Cognitive Status: Impaired/Different from baseline Arousal/Alertness: Awake/alert Orientation Level: Oriented to place, Oriented to person, Oriented to situation Attention: Selective, Alternating Selective Attention: Appears intact Alternating Attention: Impaired Alternating Attention Impairment: Verbal complex Memory: Impaired Memory Impairment: Storage deficit, Decreased recall of new information, Retrieval deficit Awareness: Impaired Awareness Impairment: Emergent impairment Problem Solving: Appears intact Executive Function: Organizing, Self Monitoring Organizing: Impaired Organizing Impairment: Verbal complex Self Monitoring: Impaired Self Monitoring Impairment: Verbal complex, Functional basic Behaviors: Restless Safety/Judgment: Impaired Cognition Arousal/Alertness: Awake/alert Behavior During Therapy: Anxious(emotional at times) Overall Cognitive Status: Impaired/Different from baseline Area of Impairment: Memory, Attention, Following commands, Safety/judgement, Awareness, Problem solving Orientation Level: Disoriented to, Situation Current Attention Level: Sustained Memory: Decreased short-term memory Following Commands: Follows one step commands inconsistently, Follows one step commands with increased  time Safety/Judgement: Decreased awareness of safety, Decreased awareness of deficits Awareness: Intellectual Problem Solving: Slow processing, Decreased initiation, Requires verbal cues, Requires tactile cues, Difficulty sequencing General Comments: Asked multiple times "What's wrong with me?" and required redirection to situation. Poor awareness of safety, thinking she could walk after it took 2 people to transfer to the chair only,     Blood pressure 140/73, pulse 96, temperature 98.9 F (37.2 C), temperature source Axillary, resp. rate 12, height 4\' 9"  (1.448 m), weight 60.8 kg, SpO2 100 %. Physical Exam  Nursing note and vitals reviewed. Constitutional: She appears well-developed and well-nourished.  Has damp rag over forehead    HENT:  Head: Normocephalic and atraumatic.  Eyes: Pupils are equal, round, and reactive to light. EOM are normal.  Neck: Normal range of motion. No tracheal deviation present. No thyromegaly present.  Cardiovascular: Normal rate and regular rhythm. Exam reveals no friction rub.  No murmur heard. Respiratory: She has no wheezes.  Increase WOB with conversation.   GI: She exhibits no distension. There is no abdominal tenderness.  Umbilical hernia noted.   Musculoskeletal:        General: Edema (trace to 1+ LE edema) present.     Comments: Low back very painful, affecting movement and deep breathing  Neurological: She is alert.  HOH? Disoriented -thinks that she's been here for 5 day and easily agitated. Needed coaxing to follow simple motor commands--poor effort. RUE grossly 3- to 3/5. RLE 2/5 LUE 1-2/5 LLE 1/5. Mild LUE flexor tone, LLE extensor tone. Decreased sense of pain LLE.   Skin: Skin is warm.  Psychiatric:  Anxious, followed simple commands. Distracted by pain      Lab Results Last 48 Hours        Results for orders placed or performed during the hospital encounter of 05/01/19 (from the past 48 hour(s))  Protime-INR     Status: None     Collection Time: 05/01/19  1:52 AM  Result Value Ref Range    Prothrombin Time 13.0 11.4 - 15.2 seconds    INR 1.0 0.8 - 1.2      Comment: (NOTE) INR goal varies based on device and disease states. Performed at [facility], [place] [address], [place], [place] [address]    APTT     Status: None    Collection Time: 05/01/19  1:52 AM  Result Value Ref Range    aPTT 27 24 - 36 seconds      Comment: Performed at [facility] [address] [place], [place] [address]  CBC     Status: Abnormal    Collection Time: 05/01/19  1:52 AM  Result Value Ref Range    WBC 10.1 4.0 - 10.5 K/uL    RBC 3.20 (L) 3.87 - 5.11 MIL/uL    Hemoglobin 8.4 (L) 12.0 - 15.0 g/dL    HCT 28.5 (L) 36.0 - 46.0 %    MCV 89.1 80.0 - 100.0 fL    MCH 26.3 26.0 - 34.0 pg    MCHC 29.5 (L) 30.0 - 36.0 g/dL    RDW 14.0 11.5 - 15.5 %    Platelets 499 (H) 150 - 400 K/uL    nRBC 0.0 0.0 - 0.2 %      Comment: Performed at [facility], [place] [address] [place], [place] [address]  Differential     Status: None    Collection Time: 05/01/19  1:52 AM  Result Value Ref Range    Neutrophils Relative % 68 %    Neutro Abs 7.0 1.7 - 7.7 K/uL    Lymphocytes Relative 18 %    Lymphs Abs 1.8 0.7 - 4.0 K/uL    Monocytes Relative 8 %    Monocytes Absolute 0.8 0.1 - 1.0 K/uL    Eosinophils Relative 4 %    Eosinophils Absolute 0.4 0.0 - 0.5 K/uL    Basophils Relative 1 %    Basophils Absolute 0.1 0.0 - 0.1 K/uL    Immature Granulocytes 1 %    Abs Immature Granulocytes 0.07 0.00 - 0.07 K/uL      Comment: Performed at [facility] [address] [place], [place] [address]  Comprehensive metabolic panel     Status: Abnormal    Collection Time: 05/01/19  1:52 AM  Result Value Ref Range    Sodium 142 135 - 145 mmol/L    Potassium 3.4 (L) 3.5 - 5.1 mmol/L    Chloride 103 98 - 111 mmol/L    CO2 30 22 - 32 mmol/L    Glucose, Bld 116 (H) 70 - 99 mg/dL    BUN 43 (H) 8 - 23 mg/dL    Creatinine, Ser 1.39 (H) 0.44 -  1.00 mg/dL    Calcium  8.7 (L) 8.9 - 10.3 mg/dL    Total Protein 6.1 (L) 6.5 - 8.1 g/dL    Albumin 2.9 (L) 3.5 - 5.0 g/dL    AST 15 15 - 41 U/L    ALT 11 0 - 44 U/L    Alkaline Phosphatase 65 38 - 126 U/L    Total Bilirubin 0.2 (L) 0.3 - 1.2 mg/dL    GFR calc non Af Amer 36 (L) >60 mL/min    GFR calc Af Amer 41 (L) >60 mL/min    Anion gap 9 5 - 15      Comment: Performed at [facility] [address] [place], [place] [address]  I-stat Creatinine, ED     Status: Abnormal    Collection Time: 05/01/19  2:01 AM  Result Value Ref Range    Creatinine, Ser 1.30 (H) 0.44 - 1.00 mg/dL  CBG monitoring, ED     Status: Abnormal    Collection Time: 05/01/19  2:51 AM  Result Value Ref Range    Glucose-Capillary 127 (H) 70 - 99 mg/dL    Comment 1 Notify RN      Comment 2 Document in Chart    Hemoglobin A1c     Status: Abnormal    Collection Time: 05/01/19  5:18 AM  Result Value Ref Range    Hgb A1c MFr Bld 6.2 (H) 4.8 - 5.6 %      Comment: (NOTE) Pre diabetes:          5.7%-6.4% Diabetes:              >6.4% Glycemic control for   <7.0% adults with diabetes      Mean Plasma Glucose 131.24 mg/dL      Comment: Performed at [facility], [place] [address], [place], [place] [address]  Lipid panel     Status: Abnormal    Collection Time: 05/01/19  5:18 AM  Result Value Ref Range    Cholesterol 170 0 - 200 mg/dL    Triglycerides 96 <150 mg/dL    HDL 45 >40 mg/dL    Total CHOL/HDL Ratio 3.8 RATIO    VLDL 19 0 - 40 mg/dL    LDL Cholesterol 106 (H) 0 - 99 mg/dL      Comment:        Total Cholesterol/HDL:CHD Risk Coronary Heart Disease Risk Table                     Men   Women  1/2 Average Risk   3.4   3.3  Average Risk       5.0   4.4  2 X Average Risk   9.6   7.1  3 X Average Risk  23.4   11.0        Use the calculated Patient Ratio above and the CHD Risk Table to determine the patient's CHD Risk.        ATP III CLASSIFICATION (LDL):  <100     mg/dL   Optimal  100-129   mg/dL   Near or Above                    Optimal  130-159  mg/dL   Borderline  160-189  mg/dL   High  >190     mg/dL   Very High Performed at [facility] [address] [place], [place] [address]    Vitamin B12     Status: None  Collection Time: 05/02/19  7:14 AM  Result Value Ref Range    Vitamin B-12 224 180 - 914 pg/mL      Comment: (NOTE) This assay is not validated for testing neonatal or myeloproliferative syndrome specimens for Vitamin B12 levels. Performed at [facility], [place] [address] [place], [place] [address]    Folate     Status: None    Collection Time: 05/02/19  7:14 AM  Result Value Ref Range    Folate 10.6 >5.9 ng/mL      Comment: Performed at [facility] [address] [place], [place] [address]  Iron and TIBC     Status: Abnormal    Collection Time: 05/02/19  7:14 AM  Result Value Ref Range    Iron 21 (L) 28 - 170 ug/dL    TIBC 308 250 - 450 ug/dL    Saturation Ratios 7 (L) 10.4 - 31.8 %    UIBC 287 ug/dL      Comment: Performed at [facility], [place] [address], [place], [place] [address]  Ferritin     Status: None    Collection Time: 05/02/19  7:14 AM  Result Value Ref Range    Ferritin 19 11 - 307 ng/mL      Comment: Performed at [facility], [place] [address] [place], [place] [address]  Reticulocytes     Status: Abnormal    Collection Time: 05/02/19  7:14 AM  Result Value Ref Range    Retic Ct Pct 2.1 0.4 - 3.1 %    RBC. 3.05 (L) 3.87 - 5.11 MIL/uL    Retic Count, Absolute 64.4 19.0 - 186.0 K/uL    Immature Retic Fract 18.8 (H) 2.3 - 15.9 %      Comment: Performed at [facility] [address], [place], [place] [address]       Imaging Results (Last 48 hours)  Ct Angio Head W Or Wo Contrast   Result Date: 05/01/2019 CLINICAL DATA:  83 y/o  F; left-sided weakness and slurred speech. EXAM: CT ANGIOGRAPHY HEAD AND NECK CT PERFUSION BRAIN TECHNIQUE: Multidetector CT imaging of the head and neck was  performed using the standard protocol during bolus administration of intravenous contrast. Multiplanar CT image reconstructions and MIPs were obtained to evaluate the vascular anatomy. Carotid stenosis measurements (when applicable) are obtained utilizing NASCET criteria, using the distal internal carotid diameter as the denominator. Multiphase CT imaging of the brain was performed following IV bolus contrast injection. Subsequent parametric perfusion maps were calculated using RAPID software. CONTRAST:  120mL OMNIPAQUE IOHEXOL 350 MG/ML SOLN COMPARISON:  05/01/2019 CT head FINDINGS: CTA NECK FINDINGS Aortic arch: Left vertebral artery arises from the aorta. Imaged portion shows no evidence of aneurysm or dissection. No significant stenosis of the major arch vessel origins. Aortic calcific atherosclerosis. Right carotid system: No evidence of dissection, stenosis (50% or greater) or occlusion. Left carotid system: Mixed plaque of left carotid bifurcation with moderate 50-70% proximal ICA stenosis. Vertebral arteries: Codominant. No evidence of dissection, stenosis (50% or greater) or occlusion. Mild stenosis of proximal right V2. calcified plaque of left vertebral artery origin with mild stenosis. Skeleton: No acute finding. Other neck: Negative. Upper chest: Centrilobular emphysema. Review of the MIP images confirms the above findings CTA HEAD FINDINGS Anterior circulation: Calcific atherosclerosis of the carotid siphons with moderate right and mild left paraclinoid ICA stenosis. Moderate right A2 and mild left A2 stenosis. Otherwise no significant stenosis, proximal occlusion,  aneurysm, or vascular malformation. Posterior circulation: Moderate right P1 and mild left P1 stenosis. Upper basilar mild stenosis. Otherwise no significant stenosis, proximal occlusion, aneurysm, or vascular malformation. Venous sinuses: As permitted by contrast timing, patent. Anatomic variants: None significant. Review of the MIP images  confirms the above findings CT Brain Perfusion Findings: ASPECTS: 10 CBF (<30%) Volume: 13mL Perfusion (Tmax>6.0s) volume: 75mL Mismatch Volume: 75mL Infarction Location:Not applicable. IMPRESSION: CT brain perfusion: 1. No acute stroke or significant oligemia by standard perfusion criteria. ASPECTS is 10. CTA head: 1. No large vessel occlusion, aneurysm, or vascular malformation identified. 2. Extensive intracranial atherosclerosis with multiple areas of mild-to-moderate stenosis in the anterior and posterior circulations. CTA neck: 1. Patent carotid and vertebral arteries. No dissection, occlusion, or aneurysm. 2. Left proximal ICA moderate 50-70% stenosis with mixed plaque. 3. Segments of mild stenosis of proximal bilateral vertebral arteries. 4. Aortic Atherosclerosis (ICD10-I70.0) and Emphysema (ICD10-J43.9). These results were called by telephone at the time of interpretation on 05/01/2019 at 2:37 am to Dr. Roland Rack , who verbally acknowledged these results. Electronically Signed   By: Kristine Garbe M.D.   On: 05/01/2019 02:41    Ct Angio Neck W Or Wo Contrast   Result Date: 05/01/2019 CLINICAL DATA:  83 y/o  F; left-sided weakness and slurred speech. EXAM: CT ANGIOGRAPHY HEAD AND NECK CT PERFUSION BRAIN TECHNIQUE: Multidetector CT imaging of the head and neck was performed using the standard protocol during bolus administration of intravenous contrast. Multiplanar CT image reconstructions and MIPs were obtained to evaluate the vascular anatomy. Carotid stenosis measurements (when applicable) are obtained utilizing NASCET criteria, using the distal internal carotid diameter as the denominator. Multiphase CT imaging of the brain was performed following IV bolus contrast injection. Subsequent parametric perfusion maps were calculated using RAPID software. CONTRAST:  147mL OMNIPAQUE IOHEXOL 350 MG/ML SOLN COMPARISON:  05/01/2019 CT head FINDINGS: CTA NECK FINDINGS Aortic arch: Left vertebral  artery arises from the aorta. Imaged portion shows no evidence of aneurysm or dissection. No significant stenosis of the major arch vessel origins. Aortic calcific atherosclerosis. Right carotid system: No evidence of dissection, stenosis (50% or greater) or occlusion. Left carotid system: Mixed plaque of left carotid bifurcation with moderate 50-70% proximal ICA stenosis. Vertebral arteries: Codominant. No evidence of dissection, stenosis (50% or greater) or occlusion. Mild stenosis of proximal right V2. calcified plaque of left vertebral artery origin with mild stenosis. Skeleton: No acute finding. Other neck: Negative. Upper chest: Centrilobular emphysema. Review of the MIP images confirms the above findings CTA HEAD FINDINGS Anterior circulation: Calcific atherosclerosis of the carotid siphons with moderate right and mild left paraclinoid ICA stenosis. Moderate right A2 and mild left A2 stenosis. Otherwise no significant stenosis, proximal occlusion, aneurysm, or vascular malformation. Posterior circulation: Moderate right P1 and mild left P1 stenosis. Upper basilar mild stenosis. Otherwise no significant stenosis, proximal occlusion, aneurysm, or vascular malformation. Venous sinuses: As permitted by contrast timing, patent. Anatomic variants: None significant. Review of the MIP images confirms the above findings CT Brain Perfusion Findings: ASPECTS: 10 CBF (<30%) Volume: 76mL Perfusion (Tmax>6.0s) volume: 51mL Mismatch Volume: 44mL Infarction Location:Not applicable. IMPRESSION: CT brain perfusion: 1. No acute stroke or significant oligemia by standard perfusion criteria. ASPECTS is 10. CTA head: 1. No large vessel occlusion, aneurysm, or vascular malformation identified. 2. Extensive intracranial atherosclerosis with multiple areas of mild-to-moderate stenosis in the anterior and posterior circulations. CTA neck: 1. Patent carotid and vertebral arteries. No dissection, occlusion, or aneurysm. 2. Left proximal  ICA moderate  50-70% stenosis with mixed plaque. 3. Segments of mild stenosis of proximal bilateral vertebral arteries. 4. Aortic Atherosclerosis (ICD10-I70.0) and Emphysema (ICD10-J43.9). These results were called by telephone at the time of interpretation on 05/01/2019 at 2:37 am to Dr. Roland Rack , who verbally acknowledged these results. Electronically Signed   By: Kristine Garbe M.D.   On: 05/01/2019 02:41    Mr Brain Wo Contrast   Result Date: 05/01/2019 CLINICAL DATA:  Left-sided weakness and slurred speech EXAM: MRI HEAD WITHOUT CONTRAST TECHNIQUE: Multiplanar, multiecho pulse sequences of the brain and surrounding structures were obtained without intravenous contrast. COMPARISON:  CTA head neck from earlier today FINDINGS: Brain: Moderate acute infarction in the right pons respecting the midline. No prior infarct is seen. There is minimal chronic small vessel ischemic type change in the cerebral white matter. Brain volume is normal. No acute hemorrhage, hydrocephalus, or masslike finding. Vascular: Major flow voids are preserved. CTA was performed earlier today Skull and upper cervical spine: Negative for marrow lesion. Degenerative cervical facet spurring. Sinuses/Orbits: Bilateral mastoid opacification with negative nasopharynx. Mastoid opacification was also seen on head CT 1 year prior. Other: Intermittent motion degradation. IMPRESSION: Acute right pontine infarction. Electronically Signed   By: Monte Fantasia M.D.   On: 05/01/2019 04:28    Ct Cerebral Perfusion W Contrast   Result Date: 05/01/2019 CLINICAL DATA:  83 y/o  F; left-sided weakness and slurred speech. EXAM: CT ANGIOGRAPHY HEAD AND NECK CT PERFUSION BRAIN TECHNIQUE: Multidetector CT imaging of the head and neck was performed using the standard protocol during bolus administration of intravenous contrast. Multiplanar CT image reconstructions and MIPs were obtained to evaluate the vascular anatomy. Carotid stenosis  measurements (when applicable) are obtained utilizing NASCET criteria, using the distal internal carotid diameter as the denominator. Multiphase CT imaging of the brain was performed following IV bolus contrast injection. Subsequent parametric perfusion maps were calculated using RAPID software. CONTRAST:  167mL OMNIPAQUE IOHEXOL 350 MG/ML SOLN COMPARISON:  05/01/2019 CT head FINDINGS: CTA NECK FINDINGS Aortic arch: Left vertebral artery arises from the aorta. Imaged portion shows no evidence of aneurysm or dissection. No significant stenosis of the major arch vessel origins. Aortic calcific atherosclerosis. Right carotid system: No evidence of dissection, stenosis (50% or greater) or occlusion. Left carotid system: Mixed plaque of left carotid bifurcation with moderate 50-70% proximal ICA stenosis. Vertebral arteries: Codominant. No evidence of dissection, stenosis (50% or greater) or occlusion. Mild stenosis of proximal right V2. calcified plaque of left vertebral artery origin with mild stenosis. Skeleton: No acute finding. Other neck: Negative. Upper chest: Centrilobular emphysema. Review of the MIP images confirms the above findings CTA HEAD FINDINGS Anterior circulation: Calcific atherosclerosis of the carotid siphons with moderate right and mild left paraclinoid ICA stenosis. Moderate right A2 and mild left A2 stenosis. Otherwise no significant stenosis, proximal occlusion, aneurysm, or vascular malformation. Posterior circulation: Moderate right P1 and mild left P1 stenosis. Upper basilar mild stenosis. Otherwise no significant stenosis, proximal occlusion, aneurysm, or vascular malformation. Venous sinuses: As permitted by contrast timing, patent. Anatomic variants: None significant. Review of the MIP images confirms the above findings CT Brain Perfusion Findings: ASPECTS: 10 CBF (<30%) Volume: 75mL Perfusion (Tmax>6.0s) volume: 25mL Mismatch Volume: 53mL Infarction Location:Not applicable. IMPRESSION: CT  brain perfusion: 1. No acute stroke or significant oligemia by standard perfusion criteria. ASPECTS is 10. CTA head: 1. No large vessel occlusion, aneurysm, or vascular malformation identified. 2. Extensive intracranial atherosclerosis with multiple areas of mild-to-moderate stenosis in the anterior and  posterior circulations. CTA neck: 1. Patent carotid and vertebral arteries. No dissection, occlusion, or aneurysm. 2. Left proximal ICA moderate 50-70% stenosis with mixed plaque. 3. Segments of mild stenosis of proximal bilateral vertebral arteries. 4. Aortic Atherosclerosis (ICD10-I70.0) and Emphysema (ICD10-J43.9). These results were called by telephone at the time of interpretation on 05/01/2019 at 2:37 am to Dr. Roland Rack , who verbally acknowledged these results. Electronically Signed   By: Kristine Garbe M.D.   On: 05/01/2019 02:41    Ct Head Code Stroke Wo Contrast   Result Date: 05/01/2019 CLINICAL DATA:  Code stroke. 83 y/o F; left-sided weakness and slurred speech. EXAM: CT HEAD WITHOUT CONTRAST TECHNIQUE: Contiguous axial images were obtained from the base of the skull through the vertex without intravenous contrast. COMPARISON:  08/29/2018 CT head. FINDINGS: Brain: Interval small age-indeterminate infarcts are present within the right caudate head and left cerebellar hemisphere. Stable small chronic infarct within the left anterior putamen. No large acute vascular territory infarction, hemorrhage, mass effect, extra-axial collection, hydrocephalus, or herniation. Stable nonspecific white matter hypodensities compatible with chronic microvascular ischemic changes and stable volume loss of the brain. Vascular: Calcific atherosclerosis of carotid siphons and vertebral arteries. No hyperdense vessel identified. Skull: Normal. Negative for fracture or focal lesion. Sinuses/Orbits: Mucosal thickening of the right maxillary sinus and right anterior ethmoid air cells. Chronic inflammatory  changes of the walls of the right maxillary sinus. Opacification of bilateral mastoid air cells. Orbits are unremarkable. Other: None. ASPECTS [facility] Stroke Program Early CT Score) - Ganglionic level infarction (caudate, lentiform nuclei, internal capsule, insula, M1-M3 cortex): 7 - Supraganglionic infarction (M4-M6 cortex): 3 Total score (0-10 with 10 being normal): 10 IMPRESSION: 1. Interval small age-indeterminate infarcts within the right caudate head and left cerebellar hemisphere. 2. Otherwise no large acute stroke, hemorrhage, or mass effect identified. ASPECTS is 10. 3. Stable chronic microvascular ischemic changes and parenchymal volume loss of the brain. These results were called by telephone at the time of interpretation on 05/01/2019 at 2:06 am to Dr. Leonel Ramsay, who verbally acknowledged these results. Electronically Signed   By: Kristine Garbe M.D.   On: 05/01/2019 02:06             Medical Problem List and Plan: 1.  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  Antithrombotics: -DVT/anticoagulation:  Pharmaceutical: Lovenox             -antiplatelet therapy: ASA/Plavix X 3 weeks followed by ASA.  3. Headaches/Chronic back pain/Pain Management:              -discussed with the patient that while we want to help control her pain, that we need to be careful not to oversedate her or cause respiratory depression.             -Will change gabapentin to 100 mg HS.              -Will try tramadol prn with local measures including heat             -Lidocain patches/heat/ice. Oxycodone not daily use per records/PMP aware reviewed.    4. Mood: LCSW to follow for evaluation and support.              -antipsychotic agents: N/A 5. Neuropsych: This patient is not capable of making decisions on her own behalf. 6. Skin/Wound Care: LCSW tio follow for evaluation and  support.  7. Fluids/Electrolytes/Nutrition: Monitor  I/O. Check lytes in am.  8. HTN: Monitor BP bid--permissive HTN and normalize in 5-7 days.  9. COPD./chronic hypoxemic respiratory failure: Question maintainence medications. Oxygen dependent.             -OOB, IS 10. Acute on chronic renal failure?: Will check lytes in am. Encourage fluid intake.  11. H/o depression: Continue cymbalta.  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  13. H/o B12 deficiency/Iron deficiency anemia: Hgb 8.4 at admission with mild leucocytosis. Will check CBC in am and order FOBT. Add iron supplement.  14. Dyslipidemia: Now on Lipitor.     Post Admission Physician Evaluation: 1. Functional deficits secondary  to right pontine infarct. 2. Patient is admitted to receive collaborative, interdisciplinary care between the physiatrist, rehab nursing staff, and therapy team. 3. Patient's level of medical complexity and substantial therapy needs in context of that medical necessity cannot be provided at a lesser intensity of care such as a SNF. 4. Patient has experienced substantial functional loss from his/her baseline which was documented above under the "Functional History" and "Functional Status" headings.  Judging by the patient's diagnosis, physical exam, and functional history, the patient has potential for functional progress which will result in measurable gains while on inpatient rehab.  These gains will be of substantial and practical use upon discharge  in facilitating mobility and self-care at the household level. 5. Physiatrist will provide 24 hour management of medical needs as well as oversight of the therapy plan/treatment and provide guidance as appropriate regarding the interaction of the two. 6. The Preadmission Screening has been reviewed and patient status is unchanged unless otherwise stated above. 7. 24 hour rehab nursing will assist with bladder management, bowel management, safety, skin/wound care, disease management, medication  administration, pain management and patient education  and help integrate therapy concepts, techniques,education, etc. 8. PT will assess and treat for/with: Lower extremity strength, range of motion, stamina, balance, functional mobility, safety, adaptive techniques and equipment, NMR, pain control.   Goals are: min assist. 9. OT will assess and treat for/with: ADL's, functional mobility, safety, upper extremity strength, adaptive techniques and equipment, NMR, pain mgt.   Goals are: min assist. Therapy may proceed with showering this patient. 10. SLP will assess and treat for/with: cognition, communication.  Goals are: supervision. 11. Case Management and Social Worker will assess and treat for psychological issues and discharge planning. 12. Team conference will be held weekly to assess progress toward goals and to determine barriers to discharge. 13. Patient will receive at least 3 hours of therapy per day at least 5 days per week. 14. ELOS: 16-23 days       15. Prognosis:  excellent   I have personally performed a face to face diagnostic evaluation of this patient and formulated the key components of the plan.  Additionally, I have personally reviewed laboratory data, imaging studies, as well as relevant notes and concur with the physician assistant's documentation above.  Meredith Staggers, MD, [place], PA-C 05/02/2019

## 2019-05-02 NOTE — Progress Notes (Signed)
  Speech Language Pathology Treatment: Cognitive-Linquistic  Patient Details Name: MARISHKA RENTFROW MRN: [id number] DOB: 09-Mar-1936 Today's Date: 05/02/2019 Time: 8588-5027 SLP Time Calculation (min) (ACUTE ONLY): 20 min  Assessment / Plan / Recommendation Clinical Impression  Pt seen for cognitive/speech therapy.  Reviewed goals/impact of stroke. Pt identified deficits s/p stroke, and could list some functions that deficits have impacted.  Voice is harsh with halting output.  Practiced breathing exercises, easy onset phonation with initial /h/ sound.  Pt required max verbal/visual cues to put strategies into practice.  Required gentle to cues to refocus attention due to tangential speech and reduced self-regulation.  Pt expresses being lonely and frustrated - provided encouragement and support.  Continue SLP to address the above; agree with CIR for rehab.  HPI HPI: Pt is an 83 y/o female who presents with L sided weakness and slurred speech. She was found to have an acute R pontine infarct. PMH significant for COPD, osteoporosis, chronic back pain.      SLP Plan  Continue with current plan of care       Recommendations   CIR                Oral Care Recommendations: Oral care BID Follow up Recommendations: Inpatient Rehab;24 hour supervision/assistance SLP Visit Diagnosis: Cognitive communication deficit (X41.287) Plan: Continue with current plan of care       GO              Brit Carbonell L. Tivis Ringer, [place] Office number [phone] Pager [phone]   Juan Quam Laurice 05/02/2019, 10:48 AM

## 2019-05-02 NOTE — Progress Notes (Signed)
Physical Therapy Treatment Patient Details Name: [name] MRN: [id number] DOB: 24-Jun-1936 Today's Date: 05/02/2019    History of Present Illness Pt is an 83 y/o female who presents with L sided weakness and slurred speech. She was found to have an acute R pontine infarct. PMH significant for COPD, osteoporosis, chronic back pain.    PT Comments    Pt demonstrating excellent rehab effort this session. Overall very willing to attempt anything therapist suggested, however fatigued quickly and required rest breaks throughout treatment. Stedy utilized for safety during standing pre-gait activity. Noted pt was able to grip center bar of Stedy with L hand fairly well. Will continue to follow and progress as able per POC. CIR remains appropriate.     Follow Up Recommendations  CIR;Supervision/Assistance - 24 hour     Equipment Recommendations  Other (comment)(TBD by next venue of care)    Recommendations for Other Services Rehab consult     Precautions / Restrictions Precautions Precautions: Fall Restrictions Weight Bearing Restrictions: No    Mobility  Bed Mobility Overal bed mobility: Needs Assistance Bed Mobility: Supine to Sit     Supine to sit: Max assist;+2 for physical assistance     General bed mobility comments: Assist for LE advancement towards EOB, scooting and trunk elevation to full sitting position. Bed pad utilized for scooting assist.   Transfers Overall transfer level: Needs assistance   Transfers: Sit to/from Stand Sit to Stand: Max assist;+2 physical assistance         General transfer comment: +2 assist to power-up to full standing position. Increased multimodal cues to maximize hip extension enough to get Stedy flaps under her.   Ambulation/Gait             General Gait Details: Pt was able to deomstrate pre-gait activity within [place]. Weight shifting x10, marching in place x2 each side with therapist assist to stabilize R knee during L  march.    Stairs             Wheelchair Mobility    Modified Rankin (Stroke Patients Only)       Balance Overall balance assessment: Needs assistance Sitting-balance support: Feet supported;No upper extremity supported Sitting balance-Leahy Scale: Poor Sitting balance - Comments: Required hands-on assist for sitting balance throughout EOB activity.  Postural control: Posterior lean Standing balance support: Bilateral upper extremity supported;During functional activity Standing balance-Leahy Scale: Zero Standing balance comment: +2 required.                             Cognition Arousal/Alertness: Awake/alert Behavior During Therapy: Anxious(emotional at times) Overall Cognitive Status: Impaired/Different from baseline Area of Impairment: Memory;Attention;Following commands;Safety/judgement;Awareness;Problem solving                   Current Attention Level: Sustained Memory: Decreased short-term memory Following Commands: Follows one step commands inconsistently;Follows one step commands with increased time Safety/Judgement: Decreased awareness of safety;Decreased awareness of deficits Awareness: Intellectual Problem Solving: Slow processing;Decreased initiation;Requires verbal cues;Requires tactile cues;Difficulty sequencing General Comments: Asked multiple times "What's wrong with me?" and required redirection to situation. Poor awareness of safety, thinking she could walk after it took 2 people to transfer to the chair only,      Exercises      General Comments        Pertinent Vitals/Pain Pain Assessment: Faces Faces Pain Scale: Hurts a little bit Pain Location: Back (chronic), hand(right) Pain Descriptors / Indicators: Aching;Discomfort;Sharp;Radiating  Pain Intervention(s): Monitored during session    Home Living                      Prior Function            PT Goals (current goals can now be found in the care plan  section) Acute Rehab PT Goals Patient Stated Goal: Not go to a nursing home; walk again PT Goal Formulation: With patient Time For Goal Achievement: 05/15/19 Potential to Achieve Goals: Good Progress towards PT goals: Progressing toward goals    Frequency    Min 4X/week      PT Plan Current plan remains appropriate    Co-evaluation              AM-PAC PT "6 Clicks" Mobility   Outcome Measure  Help needed turning from your back to your side while in a flat bed without using bedrails?: A Lot Help needed moving from lying on your back to sitting on the side of a flat bed without using bedrails?: A Lot Help needed moving to and from a bed to a chair (including a wheelchair)?: A Lot Help needed standing up from a chair using your arms (e.g., wheelchair or bedside chair)?: A Lot Help needed to walk in hospital room?: Total Help needed climbing 3-5 steps with a railing? : Total 6 Click Score: 10    End of Session Equipment Utilized During Treatment: Gait belt Activity Tolerance: Patient tolerated treatment well Patient left: in chair;with call bell/phone within reach;with chair alarm set Nurse Communication: Mobility status PT Visit Diagnosis: Unsteadiness on feet (R26.81);Hemiplegia and hemiparesis;Pain Hemiplegia - Right/Left: Left Hemiplegia - dominant/non-dominant: Dominant Hemiplegia - caused by: Cerebral infarction Pain - part of body: (back)     Time: 8144-8185 PT Time Calculation (min) (ACUTE ONLY): 26 min  Charges:  $Therapeutic Activity: 23-37 mins                     Rolinda Roan, PT, DPT Acute Rehabilitation Services Pager: [phone] Office: [phone]    Thelma Comp 05/02/2019, 1:16 PM

## 2019-05-03 ENCOUNTER — Inpatient Hospital Stay (HOSPITAL_COMMUNITY): Payer: Medicare Other

## 2019-05-03 ENCOUNTER — Inpatient Hospital Stay (HOSPITAL_COMMUNITY): Payer: Medicare Other | Admitting: Occupational Therapy

## 2019-05-03 ENCOUNTER — Inpatient Hospital Stay (HOSPITAL_COMMUNITY): Payer: Medicare Other | Admitting: Speech Pathology

## 2019-05-03 DIAGNOSIS — I69354 Hemiplegia and hemiparesis following cerebral infarction affecting left non-dominant side: Principal | ICD-10-CM

## 2019-05-03 DIAGNOSIS — I69322 Dysarthria following cerebral infarction: Secondary | ICD-10-CM

## 2019-05-03 LAB — COMPREHENSIVE METABOLIC PANEL
ALT: 8 U/L (ref 0–44)
AST: 16 U/L (ref 15–41)
Albumin: 2.5 g/dL — ABNORMAL LOW (ref 3.5–5.0)
Alkaline Phosphatase: 66 U/L (ref 38–126)
Anion gap: 13 (ref 5–15)
BUN: 14 mg/dL (ref 8–23)
CO2: 27 mmol/L (ref 22–32)
Calcium: 8.8 mg/dL — ABNORMAL LOW (ref 8.9–10.3)
Chloride: 97 mmol/L — ABNORMAL LOW (ref 98–111)
Creatinine, Ser: 0.69 mg/dL (ref 0.44–1.00)
GFR calc Af Amer: >60 mL/min (ref >60)
GFR calc non Af Amer: >60 mL/min (ref >60)
Glucose, Bld: 115 mg/dL — ABNORMAL HIGH (ref 70–99)
Potassium: 4 mmol/L (ref 3.5–5.1)
Sodium: 137 mmol/L (ref 135–145)
Total Bilirubin: 0.5 mg/dL (ref 0.3–1.2)
Total Protein: 5.7 g/dL — ABNORMAL LOW (ref 6.5–8.1)

## 2019-05-03 LAB — CBC WITH DIFFERENTIAL/PLATELET
Abs Immature Granulocytes: 0.05 10*3/uL (ref 0.00–0.07)
Basophils Absolute: 0 10*3/uL (ref 0.0–0.1)
Basophils Relative: 0 %
Eosinophils Absolute: 0.2 10*3/uL (ref 0.0–0.5)
Eosinophils Relative: 2 %
HCT: 25 % — ABNORMAL LOW (ref 36.0–46.0)
Hemoglobin: 7.7 g/dL — ABNORMAL LOW (ref 12.0–15.0)
Immature Granulocytes: 1 %
Lymphocytes Relative: 12 %
Lymphs Abs: 1.2 10*3/uL (ref 0.7–4.0)
MCH: 26.1 pg (ref 26.0–34.0)
MCHC: 30.8 g/dL (ref 30.0–36.0)
MCV: 84.7 fL (ref 80.0–100.0)
Monocytes Absolute: 0.7 10*3/uL (ref 0.1–1.0)
Monocytes Relative: 7 %
Neutro Abs: 7.9 10*3/uL — ABNORMAL HIGH (ref 1.7–7.7)
Neutrophils Relative %: 78 %
Platelets: 453 10*3/uL — ABNORMAL HIGH (ref 150–400)
RBC: 2.95 MIL/uL — ABNORMAL LOW (ref 3.87–5.11)
RDW: 13.9 % (ref 11.5–15.5)
WBC: 10.1 10*3/uL (ref 4.0–10.5)
nRBC: 0 % (ref 0.0–0.2)

## 2019-05-03 MED ORDER — GABAPENTIN 100 MG PO CAPS
100.0000 mg | ORAL_CAPSULE | Freq: Every day | ORAL | Status: DC
  Administered 2019-05-03 – 2019-05-26 (×23): 100 mg via ORAL
  Filled 2019-05-03 (×25): qty 1

## 2019-05-03 NOTE — Progress Notes (Addendum)
[name] PHYSICAL MEDICINE & REHABILITATION PROGRESS NOTE   Subjective/Complaints:  "my son put me in here"  , "you are in on it"  ROS- neg CP, SOB, N/V?D   Objective:   No results found. Recent Labs    05/01/19 0152 05/03/19 0521  WBC 10.1 10.1  HGB 8.4* 7.7*  HCT 28.5* 25.0*  PLT 499* 453*   Recent Labs    05/01/19 0152 05/01/19 0201 05/03/19 0521  NA 142  --  137  K 3.4*  --  4.0  CL 103  --  97*  CO2 30  --  27  GLUCOSE 116*  --  115*  BUN 43*  --  14  CREATININE 1.39* 1.30* 0.69  CALCIUM 8.7*  --  8.8*   No intake or output data in the 24 hours ending 05/03/19 0710   Physical Exam: Vital Signs Blood pressure 130/78, pulse 93, temperature 98.5 F (36.9 C), temperature source Oral, resp. rate 20, height 4\' 9"  (1.448 m), weight 61.9 kg, SpO2 98 %.   General: No acute distress Mood and affect are appropriate Heart: Regular rate and rhythm no rubs murmurs or extra sounds Lungs: Clear to auscultation, breathing unlabored, no rales or wheezes Abdomen: Positive bowel sounds, soft nontender to palpation, nondistended Extremities: No clubbing, cyanosis, or edema Skin: No evidence of breakdown, no evidence of rash Neurologic: Cranial nerves II through XII intact, motor strength is 5/5 in right deltoid, bicep, tricep, grip, hip flexor, knee extensors, ankle dorsiflexor and plantar flexor 2- Left delt bi tri, 3- grip, 2- L hip/knee ext synergy Sensory exam normal sensation to light touch and proprioception in bilateral upper and lower extremities Cerebellar exam normal finger to nose to finger as well as heel to shin in bilateral upper and lower extremities Musculoskeletal: Full range of motion in all 4 extremities. No joint swelling   Assessment/Plan: 1. Functional deficits secondary to RIght pontine infarct which require 3+ hours per day of interdisciplinary therapy in a comprehensive inpatient rehab setting.  Physiatrist is providing close team supervision  and 24 hour management of active medical problems listed below.  Physiatrist and rehab team continue to assess barriers to discharge/monitor patient progress toward functional and medical goals  Care Tool:  Bathing              Bathing assist       Upper Body Dressing/Undressing Upper body dressing        Upper body assist      Lower Body Dressing/Undressing Lower body dressing            Lower body assist       Toileting Toileting    Toileting assist Assist for toileting: Dependent - Patient 0%     Transfers Chair/bed transfer  Transfers assist  Chair/bed transfer activity did not occur: Safety/medical concerns        Locomotion Ambulation   Ambulation assist              Walk 10 feet activity   Assist           Walk 50 feet activity   Assist           Walk 150 feet activity   Assist           Walk 10 feet on uneven surface  activity   Assist           Wheelchair     Assist  Wheelchair 50 feet with 2 turns activity    Assist            Wheelchair 150 feet activity     Assist          Medical Problem List and Plan: 1.Functional deficits and left hemiparesissecondary to right pontine infarct CIR PT, OT, SLP evals today 2. Antithrombotics: -DVT/anticoagulation:Pharmaceutical:Lovenox -antiplatelet therapy: ASA/Plavix X 3 weeks followed by ASA. 3.Headaches/Chronic back pain/Pain Management: -discussed with the patient that while we want to help control her pain, that we need to be careful not to oversedate her or cause respiratory depression. -Will change gabapentin to 100 mg HS. -Will try tramadol prn with local measuresincluding heat -Lidocain patches/heat/ice.Oxycodone not daily use per records/PMP aware reviewed. 4. Mood:LCSW to follow for evaluation and  support. -antipsychotic agents: N/A 5. Neuropsych: This patientis notcapable of making decisions onherown behalf. Paranoia noted per SLP this am, "staff and kids are in on it" Review of chart shows 2 hosptializations for AMS/encephalopathy that improved with hydration Improvements occurred over 4-5 days time some question of underlying dementia, ask Neuropsych to eval  Doubt B12 due  To normal B12 level and lack of macrocytosis on differential  Minimal SVD on MRI so subcortical ischemic encephalopathy less likely  6. Skin/Wound Care:LCSW tio follow for evaluation and support. 7. Fluids/Electrolytes/Nutrition:Monitor I/O. Check lytes in am.  8. HTN: Monitor BP bid--permissive HTN and normalize in 5-7 days.  9. COPD./chronic hypoxemic respiratory failure: Question maintainence medications. Oxygen dependent.add prn neb -OOB, IS 10. Pre renal azotemia resolved with hydration , monitor po  11. H/o depression: Continue cymbalta.  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  13. H/o B12 deficiency/Iron deficiency anemia: Hgb 8.4 at admission with mild leucocytosis. Will check CBC in am and orderFOBT. Add iron supplement. Dropped to 7.7, Likely dilutional since BUN has improved from 43 to  14. Dyslipidemia: Now on Lipitor.    LOS: 1 days A FACE TO [place] 05/03/2019, 7:10 AM

## 2019-05-03 NOTE — Progress Notes (Signed)
Inpatient Rehabilitation  Patient information reviewed and entered into eRehab system by Kapono Luhn M. Nazareth Norenberg, M.A., CCC/SLP, PPS Coordinator.  Information including medical coding, functional ability and quality indicators will be reviewed and updated through discharge.    

## 2019-05-03 NOTE — Evaluation (Signed)
Physical Therapy Assessment and Plan  Patient Details  Name: [name] MRN: [id number] Date of Birth: 06-22-1936  PT Diagnosis: Abnormal posture, Difficulty walking, Hemiparesis non-dominant, Impaired cognition and Muscle weakness Rehab Potential: Good ELOS: 3-4 weeks   Today's Date: 05/03/2019 PT Individual Time: 0902-1015 PT Individual Time Calculation (min): 73 min    Problem List:  Patient Active Problem List   Diagnosis Date Noted  . Right pontine stroke ([place]) 05/02/2019  . Stroke ([place]) 05/01/2019  . Acute renal failure superimposed on stage 3 chronic kidney disease ([place]) 05/01/2019  . Acute ischemic stroke ([place]) 05/01/2019  . AKI (acute kidney injury) ([place]) 08/29/2018  . Encephalopathy acute 08/22/2018  . Chronic back pain 08/22/2018  . Vitamin B12 deficiency 08/19/2018  . Dehydration   . Tachycardia   . Altered mental state 08/18/2018  . Closed compression fracture of L1 vertebra ([facility])   . Acute cystitis without hematuria   . Pain management   . Intractable pain   . Back pain 10/10/2016  . L2 vertebral fracture ([place]) 10/10/2016  . Acute encephalopathy 12/25/2015  . Drug-induced delirium ([place]) 12/25/2015  . Acute on chronic respiratory failure ([place]) 12/25/2015  . CAP (community acquired pneumonia) 12/19/2015  . Chronic respiratory failure ([place]) 03/19/2014  . Depression 03/19/2014  . Aspiration pneumonia ([place]) 03/10/2014  . Acute respiratory failure with hypoxia ([place]) 03/10/2014  . Sepsis ([place]) 03/10/2014  . Hypokalemia 03/10/2014  . Hypertension 03/10/2014  . COPD (chronic obstructive pulmonary disease) ([place]) 03/10/2014  . Tobacco abuse 03/10/2014  . Normocytic anemia 06/15/2013  . Generalized abdominal pain 06/15/2013  . Blood in the stool 06/15/2013    Past Medical History:  Past Medical History:  Diagnosis Date  . CAP (community acquired pneumonia)    multiple admissions  . Chronic back pain 08/22/2018  . Colitis 03/19/2014  . Compression fx, lumbar  spine ([place])   . COPD (chronic obstructive pulmonary disease) ([place])   . Delirium   . Hypertension   . On home oxygen therapy 06-14-13   continuos 2.5l/m nasally-24/7  . Osteoporosis   . Sacral fracture ([place]) 2014   Past Surgical History:  Past Surgical History:  Procedure Laterality Date  . ABDOMINAL HYSTERECTOMY    . ESOPHAGOGASTRODUODENOSCOPY N/A 06/15/2013   Procedure: ESOPHAGOGASTRODUODENOSCOPY (EGD);  Surgeon: [name], MD;  Location: [facility];  Service: Endoscopy;  Laterality: N/A;  . IR GENERIC HISTORICAL  10/15/2016   IR KYPHO LUMBAR INC FX REDUCE BONE BX UNI/BIL CANNULATION INC/IMAGING 10/15/2016 Luanne Bras, MD [facility]  . sacroplasty  06-14-13   05-10-13 IVR [facility]E for fracture stabilization    Assessment & Plan Clinical Impression: Patient is a 83 y.o. year old female with history of COPD- oxygen dependent, HTN, chronic back pain who was admitted on 05/01/19 with slurred speech, left sided weakness and difficulty walking. 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 MRI brain showed acute right pontine infarct. 2D echo with bubble study showed EF 60-65% with normal systolic function andnegative bubble study.Dr. Erlinda Hong felt that stroke was due to small vessel disease and recommended DAPT folowed by ASA alone and outpatient follow up with [facility] for monitoring of asymptomatic L-ICA stenosis. Patient with resultant left sided weakness with poor postural control at EOB, mild cognitive deficits and moderate flaccid dysarthria affecting mobility and self care. CIR recommended due to functional deficits.  Patient transferred to CIR on 05/02/2019 .   Patient currently  requires max with mobility secondary to muscle weakness, abnormal tone and decreased coordination, decreased motor planning, decreased attention, decreased awareness and decreased memory and decreased  sitting balance, decreased standing balance, decreased postural control, hemiplegia and decreased balance strategies.  Prior to hospitalization, patient was modified independent  with mobility and lived with Son in a House home.  Home access is 2Stairs to enter.  Patient will benefit from skilled PT intervention to maximize safe functional mobility, minimize fall risk and decrease caregiver burden for planned discharge home with 24 hour assist.  Anticipate patient will benefit from follow up [facility] at discharge.  PT - End of Session Activity Tolerance: Tolerates 30+ min activity with multiple rests Endurance Deficit: Yes PT Assessment Rehab Potential (ACUTE/IP ONLY): Good PT Barriers to Discharge: Home environment access/layout;Behavior PT Patient demonstrates impairments in the following area(s): Balance;Behavior;Endurance;Motor;Sensory;Safety;Perception;Pain;Skin Integrity PT Transfers Functional Problem(s): Bed Mobility;Bed to Chair;Car;Furniture PT Locomotion Functional Problem(s): Ambulation;Wheelchair Mobility;Stairs PT Plan PT Intensity: Minimum of 1-2 x/day ,45 to 90 minutes PT Frequency: 5 out of 7 days PT Duration Estimated Length of Stay: 3-4 weeks PT Treatment/Interventions: Ambulation/gait training;Balance/vestibular training;Cognitive remediation/compensation;Community reintegration;Discharge planning;Disease management/prevention;DME/adaptive equipment instruction;Functional electrical stimulation;Neuromuscular re-education;Patient/family education;Stair training;Therapeutic Exercise;UE/LE Coordination activities;Wheelchair propulsion/positioning;Skin care/wound management;Functional mobility training;Pain management;Splinting/orthotics;Therapeutic Activities;UE/LE Strength taining/ROM;Visual/perceptual remediation/compensation;Psychosocial support PT Transfers Anticipated Outcome(s): min assist PT Locomotion Anticipated Outcome(s): min assist PT Recommendation Follow Up  Recommendations: Home health PT Patient destination: Home Equipment Recommended: To be determined  Skilled Therapeutic Intervention Evaluation completed (see details above and below) with education on PT POC and goals and individual treatment initiated with focus on transfers, education and out of bed activity tolerance. Pt supine in bed on the phone with her son upon PT arrival. Pt telling her son over the phone "you are all against me, you have teamed up with these people here." Pt aware that she has had a stroke but then reports to the therapist "I don't know what you did to make me have this stroke." Pt reports that she has had a bowel movement. Pt performed rolling in each direction with max assist, while second helper assisted with peri care and brief management total assist. Pt incontinent of bowel and bladder this session. Pt continues to report that the staff here is all against her, she also reports "some man is coming in at night and trying to hang me on the rafters." Therapist continued to redirect the pt and ask about her family/cat. Pt agreeable to sit EOB with assist from therapist, supine>sitting with max assist and cues for techniques. Pt performed sit<>stand from EOB with max assist. Pt performed stand pivot to the w/c with max assist +2, cues for techniques. Pt transported to the gym. Pt and therapist engaging in conversation, therapist educating pt on why she is here in rehab and purpose of stroke rehabilitation. Pt convinced that this is a nursing home and we are working against her with her son, therapist able to reassure pt that's not true and our goal of rehab is to help her get home. Pt transferred back to bed using the stedy with max+2 assist. Pt transferred to supine Max +2 and left with needs in reach and bed alarm set.    PT Evaluation Precautions/Restrictions Precautions Precautions: Fall Restrictions Weight Bearing Restrictions: No General   Vital SignsTherapy  Vitals Temp: 98.5 F (36.9 C) Temp Source: Oral Pulse Rate: 93 Resp: 20 BP: 130/78 Patient Position (if appropriate): Lying Oxygen Therapy SpO2: 98 % O2 Device: Nasal Cannula O2 Flow Rate (L/min): 2  L/min Pain Pain Assessment Pain Score: 5  Pain Type: Chronic pain Pain Location: Back Pain Orientation: Lower Pain Descriptors / Indicators: Aching;Discomfort Pain Onset: With Activity Pain Intervention(s): Heat applied(k-pad on; lidocaine patch placed) Home Living/Prior Functioning Home Living Available Help at Discharge: Family;Available 24 hours/day Type of Home: House Home Access: Stairs to enter [employer] of Steps: 2 Entrance Stairs-Rails: Right Home Layout: One level Additional Comments: [name] has been asissting with bathing and dressing over past few weeks due to her decline in function(per chart review, pt refuses to answer therapists questions)  Lives With: Son Prior Function Level of Independence: Independent with gait;Requires assistive device for independence  Able to Take Stairs?: Yes Comments: pt states she does all BADLs and son helps with IADLs. Per chart review pt used rollator for mobility.  Cognition Overall Cognitive Status: Impaired/Different from baseline Arousal/Alertness: Awake/alert Orientation Level: Oriented to person;Oriented to situation;Oriented to place Attention: Selective;Alternating Selective Attention: Appears intact Alternating Attention: Impaired Memory: Impaired Memory Impairment: Storage deficit;Decreased recall of new information;Retrieval deficit Awareness: Impaired Behaviors: Restless;Agitated Behavior Scale Safety/Judgment: Impaired Sensation Sensation Light Touch: Appears Intact Proprioception: Appears Intact Additional Comments: difficult to formally assess secondary to pt agitation however appears intact B LEs Coordination Gross Motor Movements are Fluid and Coordinated: No Fine Motor Movements are Fluid and  Coordinated: No Coordination and Movement Description: coordination impaired secondary to increased tone and hemiparesis Motor  Motor Motor: Hemiplegia;Abnormal tone Motor - Skilled Clinical Observations: L hypertonia and hemiparesis  Mobility Bed Mobility Bed Mobility: Rolling Right;Rolling Left;Supine to Sit;Sit to Supine Rolling Right: Maximal Assistance - Patient 25-49% Rolling Left: Maximal Assistance - Patient 25-49% Supine to Sit: Maximal Assistance - Patient - Patient 25-49% Sit to Supine: Maximal Assistance - Patient 25-49% Transfers Transfers: Sit to Stand;Stand Pivot Transfers Sit to Stand: Maximal Assistance - Patient 25-49% Stand Pivot Transfers: 2 Helpers Transfer (Assistive device): None Trunk/Postural Assessment  Cervical Assessment Cervical Assessment: Exceptions to WFL(forward head posture) Thoracic Assessment Thoracic Assessment: Exceptions to WFL(kyphotic, rounded shoulder) Lumbar Assessment Lumbar Assessment: Exceptions to WFL(posterior pelvic tilt) Postural Control Postural Control: Deficits on evaluation(impaired)  Balance Balance Balance Assessed: Yes Static Sitting Balance Static Sitting - Level of Assistance: 3: Mod assist(increased assist secondary to posterior lean) Dynamic Sitting Balance Dynamic Sitting - Level of Assistance: 2: Max assist Static Standing Balance Static Standing - Level of Assistance: 2: Max assist Dynamic Standing Balance Dynamic Standing - Level of Assistance: 1: +2 Total assist Extremity Assessment  RLE Assessment Passive Range of Motion (PROM) Comments: tight heelcords and hamstrings General Strength Comments: difficult to formally assess, grossly 3- to 4/5 throughout LLE Assessment LLE Assessment: Exceptions to [facility] Passive Range of Motion (PROM) Comments: tight heelcords and hamstrings LLE Strength Left Hip Flexion: 2+/5 Left Hip Extension: 2/5 Left Knee Flexion: 2/5 Left Knee Extension: 2+/5 Left Ankle  Dorsiflexion: 2-/5 Left Ankle Plantar Flexion: 2-/5    Refer to Care Plan for Long Term Goals  Recommendations for other services: None   Discharge Criteria: Patient will be discharged from PT if patient refuses treatment 3 consecutive times without medical reason, if treatment goals not met, if there is a change in medical status, if patient makes no progress towards goals or if patient is discharged from hospital.  The above assessment, treatment plan, treatment alternatives and goals were discussed and mutually agreed upon: by patient  Netta Corrigan, PT, DPT 05/03/2019, 9:55 AM

## 2019-05-03 NOTE — Progress Notes (Signed)
Orthopedic Tech Progress Note Patient Details:  [name] [birth year] [id number] Called in order to [facility] Patient ID: [name], female   DOB: [birth year], 83 y.o.   MRN: [id number]   Janit Pagan 05/03/2019, 8:09 AM

## 2019-05-03 NOTE — Evaluation (Signed)
Occupational Therapy Assessment and Plan  Patient Details  Name: [name] MRN: [id number] Date of Birth: 01-10-1936  OT Diagnosis: abnormal posture, altered mental status, cognitive deficits, hemiplegia affecting non-dominant side, muscular wasting and disuse atrophy and pain in joint Rehab Potential: Rehab Potential (ACUTE ONLY): Fair ELOS: 21-23 days   Today's Date: 05/03/2019 OT Individual Time: 1749-4496 OT Individual Time Calculation (min): 63 min     Problem List:  Patient Active Problem List   Diagnosis Date Noted  . Right pontine stroke ([place]) 05/02/2019  . Stroke ([place]) 05/01/2019  . Acute renal failure superimposed on stage 3 chronic kidney disease ([place]) 05/01/2019  . Acute ischemic stroke ([place]) 05/01/2019  . AKI (acute kidney injury) ([place]) 08/29/2018  . Encephalopathy acute 08/22/2018  . Chronic back pain 08/22/2018  . Vitamin B12 deficiency 08/19/2018  . Dehydration   . Tachycardia   . Altered mental state 08/18/2018  . Closed compression fracture of L1 vertebra (HCC)   . Acute cystitis without hematuria   . Pain management   . Intractable pain   . Back pain 10/10/2016  . L2 vertebral fracture ([place]) 10/10/2016  . Acute encephalopathy 12/25/2015  . Drug-induced delirium ([place]) 12/25/2015  . Acute on chronic respiratory failure ([place]) 12/25/2015  . CAP (community acquired pneumonia) 12/19/2015  . Chronic respiratory failure ([place]) 03/19/2014  . Depression 03/19/2014  . Aspiration pneumonia ([place]) 03/10/2014  . Acute respiratory failure with hypoxia ([place]) 03/10/2014  . Sepsis ([place]) 03/10/2014  . Hypokalemia 03/10/2014  . Hypertension 03/10/2014  . COPD (chronic obstructive pulmonary disease) ([place]) 03/10/2014  . Tobacco abuse 03/10/2014  . Normocytic anemia 06/15/2013  . Generalized abdominal pain 06/15/2013  . Blood in the stool 06/15/2013    Past Medical History:  Past Medical History:  Diagnosis Date  . CAP (community acquired pneumonia)    multiple  admissions  . Chronic back pain 08/22/2018  . Colitis 03/19/2014  . Compression fx, lumbar spine ([place])   . COPD (chronic obstructive pulmonary disease) ([place])   . Delirium   . Hypertension   . On home oxygen therapy 06-14-13   continuos 2.5l/m nasally-24/7  . Osteoporosis   . Sacral fracture ([place]) 2014   Past Surgical History:  Past Surgical History:  Procedure Laterality Date  . ABDOMINAL HYSTERECTOMY    . ESOPHAGOGASTRODUODENOSCOPY N/A 06/15/2013   Procedure: ESOPHAGOGASTRODUODENOSCOPY (EGD);  Surgeon: [name], MD;  Location: [facility];  Service: Endoscopy;  Laterality: N/A;  . IR GENERIC HISTORICAL  10/15/2016   IR KYPHO LUMBAR INC FX REDUCE BONE BX UNI/BIL CANNULATION INC/IMAGING 10/15/2016 Luanne Bras, MD [facility]  . sacroplasty  06-14-13   05-10-13 IVR CONE for fracture stabilization    Assessment & Plan Clinical Impression: Patient is a 83 y.o. year old female with recent admission to the hospital on 05/01/19 with slurred speech, left sided weakness and difficulty walking. 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 MRI brain showed acute right pontine infarct. .  Patient transferred to CIR on 05/02/2019 .    Patient currently requires total with basic self-care skills secondary to muscle weakness, decreased cardiorespiratoy endurance, impaired timing and sequencing, unbalanced muscle activation and decreased coordination, decreased attention, decreased awareness, decreased problem solving, decreased safety awareness, decreased memory and delayed processing and decreased sitting balance, decreased standing balance, decreased postural control, hemiplegia and decreased balance strategies.  Prior to hospitalization, patient could complete ADLs with min.  Patient  will benefit from skilled intervention to decrease level of assist with basic self-care skills  prior to discharge home with care partner.  Anticipate patient will require minimal physical assistance and follow up home health.  OT - End of Session Endurance Deficit: Yes OT Assessment Rehab Potential (ACUTE ONLY): Fair OT Patient demonstrates impairments in the following area(s): Balance;Motor;Sensory;Endurance;Perception;Safety;Cognition OT Basic ADL's Functional Problem(s): Eating;Grooming;Dressing;Toileting;Bathing OT Transfers Functional Problem(s): Toilet;Tub/Shower OT Plan OT Intensity: Minimum of 1-2 x/day, 45 to 90 minutes OT Frequency: 5 out of 7 days OT Duration/Estimated Length of Stay: 21-23 days OT Treatment/Interventions: Balance/vestibular training;Cognitive remediation/compensation;Community reintegration;Disease mangement/prevention;Neuromuscular re-education;Patient/family education;Self Care/advanced ADL retraining;Therapeutic Exercise;UE/LE Coordination activities;DME/adaptive equipment instruction;Functional mobility training;Therapeutic Activities;UE/LE Strength taining/ROM OT Self Feeding Anticipated Outcome(s): setup OT Basic Self-Care Anticipated Outcome(s): min assist OT Toileting Anticipated Outcome(s): min assist OT Bathroom Transfers Anticipated Outcome(s): min to mod assist OT Recommendation Patient destination: Home Follow Up Recommendations: 24 hour supervision/assistance Equipment Recommended: 3 in 1 bedside comode   Skilled Therapeutic Intervention Pt completed bathing and dressing supine to sitting during session.  Pt hyperverbal throughout session, requiring max re-direction to answer therapist's questions regarding PLOF and for sustained attention to bathing tasks.  Max assist for rolling side to side in the bed for washing peri area and for donning new brief.  Max assist for supine to sit as well on the right side of the bed.  Once sitting, she was able to maintain static sitting balance with mod assist.  Noted head tilt to the right in sitting with  right shoulder elevation as well as left trunk passive elongation with left rotation.  She then completed stand pivot transfer to the wheelchair with total assist to the left.  Pt worked on UB bathing from the wheelchair but needed max assist secondary to attention level and LUE hemiparesis.  Provided new gown for her to wear with max assist to donn.  Pt at times shifting from topic to topic and still stating that she felt like her son had scammed her into coming.  Finished session with pt in the wheelchair and call button and phone in reach with safety belt in place.  Nursing made aware so they can assist with lunch.      OT Evaluation Precautions/Restrictions  Precautions Precautions: Fall Precaution Comments: O2 dependent Restrictions Weight Bearing Restrictions: No   Vital Signs Oxygen Therapy SpO2: 93 % O2 Flow Rate (L/min): 2 L/min Pain Pain Assessment Pain Scale: 0-10 Pain Score: Asleep Pain Type: Chronic pain Pain Location: Back Pain Orientation: Lower Pain Descriptors / Indicators: Aching Pain Frequency: Intermittent Pain Onset: On-going Patients Stated Pain Goal: 2 Pain Intervention(s): Medication (See eMAR) Home Living/Prior Functioning Home Living Family/patient expects to be discharged to:: Private residence Living Arrangements: Children(Son to go home with patient) Available Help at Discharge: Family, Available 24 hours/day Type of Home: House Home Access: Stairs to enter Technical brewer of Steps: 2 Entrance Stairs-Rails: Right Home Layout: One level Bathroom Shower/Tub: Tub/shower unit, Architectural technologist: Standard Bathroom Accessibility: Yes Additional Comments: [name] has been asissting with bathing and dressing over past few weeks due to her decline in function  Lives With: Son IADL History Homemaking Responsibilities: No Current License: No Occupation: Retired Prior Function Level of Independence: Requires assistive device for independence,  Needs assistance with ADLs  Able to Take Stairs?: Yes Driving: No Comments: pt states she does all BADLs and son helps with IADLs. Per chart review pt used rollator for mobility.  ADL ADL Eating: Set up Where Assessed-Eating: Wheelchair  Grooming: Minimal assistance Where Assessed-Grooming: Wheelchair Upper Body Bathing: Maximal assistance Where Assessed-Upper Body Bathing: Wheelchair Lower Body Bathing: Dependent Where Assessed-Lower Body Bathing: Bed level Upper Body Dressing: Maximal assistance Where Assessed-Upper Body Dressing: Wheelchair Lower Body Dressing: Dependent Where Assessed-Lower Body Dressing: Bed level Toileting: Dependent Where Assessed-Toileting: Bed level Toilet Transfer: Dependent Toilet Transfer Method: Stand pivot Science writer: Bedside commode Vision Baseline Vision/History: Wears glasses Wears Glasses: Reading only Patient Visual Report: No change from baseline Vision Assessment?: Vision impaired- to be further tested in functional context Perception  Perception: Within Functional Limits Praxis Praxis: Intact Cognition Overall Cognitive Status: Impaired/Different from baseline Arousal/Alertness: Awake/alert Orientation Level: Person;Place;Situation Person: Oriented Place: Oriented Situation: Oriented Year: 2020 Month: May Day of Week: Incorrect Memory: Impaired Memory Impairment: Storage deficit Immediate Memory Recall: Sock;Blue;Bed Memory Recall: Sock;Blue;Bed Memory Recall Sock: Without Cue Memory Recall Blue: With Cue Memory Recall Bed: Without Cue Attention: Sustained;Focused Focused Attention: Appears intact Sustained Attention: Impaired Sustained Attention Impairment: Verbal basic;Functional basic Selective Attention: Impaired Awareness: Impaired Awareness Impairment: Intellectual impairment Problem Solving: Impaired Problem Solving Impairment: Verbal basic;Functional basic Behaviors:  Perseveration;Confabulation Safety/Judgment: Impaired Comments: Pt hyperverbal throughout session, needed mod instructional cueing for sequencing tasks as well as sustained attention.   Sensation Sensation Light Touch: Impaired Detail Light Touch Impaired Details: Impaired LUE(Pt reports a difference in sensation when testing the hand.  ) Hot/Cold: Not tested Proprioception: Not tested Stereognosis: Not tested Coordination Gross Motor Movements are Fluid and Coordinated: No Fine Motor Movements are Fluid and Coordinated: No Coordination and Movement Description: Pt with Brunnstrum stage III movement in the arm and stage IV in the hand.   Motor  Motor Motor: Hemiplegia;Abnormal tone Motor - Skilled Clinical Observations: LUE and LLE hemiparesis Mobility  Bed Mobility Bed Mobility: Rolling Right;Rolling Left;Supine to Sit Rolling Right: Maximal Assistance - Patient 25-49% Rolling Left: Maximal Assistance - Patient 25-49% Supine to Sit: Maximal Assistance - Patient - Patient 25-49% Sit to Supine: Maximal Assistance - Patient 25-49% Transfers Sit to Stand: Maximal Assistance - Patient 25-49%  Trunk/Postural Assessment  Cervical Assessment Cervical Assessment: Exceptions to WFL(cervical flexion forward as well as laterally to the right) Thoracic Assessment Thoracic Assessment: Exceptions to WFL(kyphotic rounding as well as right thoracic flexion with left thoracic elongation) Lumbar Assessment Lumbar Assessment: Exceptions to WFL(posterior pelvic tilt with lumbar flexion and pelvic tilt to the right) Postural Control Postural Control: Deficits on evaluation  Balance Balance Balance Assessed: Yes Static Sitting Balance Static Sitting - Balance Support: Feet supported Static Sitting - Level of Assistance: 3: Mod assist Dynamic Sitting Balance Dynamic Sitting - Balance Support: During functional activity Dynamic Sitting - Level of Assistance: 2: Max assist Static Standing  Balance Static Standing - Balance Support: During functional activity;Right upper extremity supported Static Standing - Level of Assistance: 2: Max assist Dynamic Standing Balance Dynamic Standing - Balance Support: During functional activity;Right upper extremity supported Dynamic Standing - Level of Assistance: 1: +2 Total assist Extremity/Trunk Assessment RUE Assessment RUE Assessment: Exceptions to [facility] General Strength Comments: shoulder AROM 0-80 degrees with testing, general strength throughout is at a 3+/5 LUE Assessment LUE Assessment: Exceptions to [facility] Passive Range of Motion (PROM) Comments: PROM WFLS for all joints General Strength Comments: Pt currently Brunnstrum stage III movement in the right arm and stage IV in the hand.  Max hand over hand assist needed for use with selfcare tasks.       Refer to Care Plan for Long Term Goals  Recommendations for other services: Neuropsych  Discharge Criteria: Patient will be discharged from OT if patient refuses treatment 3 consecutive times without medical reason, if treatment goals not met, if there is a change in medical status, if patient makes no progress towards goals or if patient is discharged from hospital.  The above assessment, treatment plan, treatment alternatives and goals were discussed and mutually agreed upon: by patient  Smt Lokey OTR/L 05/03/2019, 4:33 PM

## 2019-05-03 NOTE — Evaluation (Signed)
Speech Language Pathology Assessment and Plan  Patient Details  Name: [name] MRN: [id number] Date of Birth: 04-12-1936  SLP Diagnosis: Cognitive Impairments;Dysarthria  Rehab Potential: Fair ELOS: 3-4 weeks     Today's Date: 05/03/2019 SLP Individual Time: 0730-0800 SLP Individual Time Calculation (min): 30 min   Problem List:  Patient Active Problem List   Diagnosis Date Noted  . Right pontine stroke ([place]) 05/02/2019  . Stroke ([place]) 05/01/2019  . Acute renal failure superimposed on stage 3 chronic kidney disease ([place]) 05/01/2019  . Acute ischemic stroke ([place]) 05/01/2019  . AKI (acute kidney injury) ([place]) 08/29/2018  . Encephalopathy acute 08/22/2018  . Chronic back pain 08/22/2018  . Vitamin B12 deficiency 08/19/2018  . Dehydration   . Tachycardia   . Altered mental state 08/18/2018  . Closed compression fracture of L1 vertebra (HCC)   . Acute cystitis without hematuria   . Pain management   . Intractable pain   . Back pain 10/10/2016  . L2 vertebral fracture ([place]) 10/10/2016  . Acute encephalopathy 12/25/2015  . Drug-induced delirium ([place]) 12/25/2015  . Acute on chronic respiratory failure ([place]) 12/25/2015  . CAP (community acquired pneumonia) 12/19/2015  . Chronic respiratory failure ([place]) 03/19/2014  . Depression 03/19/2014  . Aspiration pneumonia ([place]) 03/10/2014  . Acute respiratory failure with hypoxia ([place]) 03/10/2014  . Sepsis ([place]) 03/10/2014  . Hypokalemia 03/10/2014  . Hypertension 03/10/2014  . COPD (chronic obstructive pulmonary disease) ([place]) 03/10/2014  . Tobacco abuse 03/10/2014  . Normocytic anemia 06/15/2013  . Generalized abdominal pain 06/15/2013  . Blood in the stool 06/15/2013   Past Medical History:  Past Medical History:  Diagnosis Date  . CAP (community acquired pneumonia)    multiple admissions  . Chronic back pain 08/22/2018  . Colitis 03/19/2014  . Compression fx, lumbar spine ([place])   . COPD (chronic obstructive pulmonary  disease) ([place])   . Delirium   . Hypertension   . On home oxygen therapy 06-14-13   continuos 2.5l/m nasally-24/7  . Osteoporosis   . Sacral fracture ([place]) 2014   Past Surgical History:  Past Surgical History:  Procedure Laterality Date  . ABDOMINAL HYSTERECTOMY    . ESOPHAGOGASTRODUODENOSCOPY N/A 06/15/2013   Procedure: ESOPHAGOGASTRODUODENOSCOPY (EGD);  Surgeon: [name], MD;  Location: [facility];  Service: Endoscopy;  Laterality: N/A;  . IR GENERIC HISTORICAL  10/15/2016   IR KYPHO LUMBAR INC FX REDUCE BONE BX UNI/BIL CANNULATION INC/IMAGING 10/15/2016 Luanne Bras, MD [facility]  . sacroplasty  06-14-13   05-10-13 IVR CONE for fracture stabilization    Assessment / Plan / Recommendation Clinical Impression Patient is an 83 year old femle with history of COPD- oxygen dependent, HTN, chronic back pain who was admitted on 05/01/19 with slurred speech, left sided weakness and difficulty walking. 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 MRI brain showed acute right pontine infarct. 2D echo with bubble study showed EF 60-65% with normal systolic function andnegative bubble study.Dr. Erlinda Hong felt that stroke was due to small vessel disease and recommended DAPT folowed by ASA alone and outpatient follow up with [facility]VS for monitoring of asymptomatic L-ICA stenosis. Patient with resultant left sided weakness with poor postural control at EOB, mild cognitive deficits and moderate flaccid dysarthria affecting mobility and self care. CIR recommended due to functional deficits and patient admitted 05/02/19.  Patient demonstrates generalized confusion which impacted her ability to participate in the evaluation.  Patient insistent that she has met this clinician previously and declined to answer any questions related to previous function. Patient also declined to eat and would not  let clinician assist her in any task. Patient was oriented to place and situation but currently believes the hospital staff is "in on a plot with her family." Clinician called her son to explain situation and requested that her sister call her to help comfort the patient. The patient was able to talk to her sister but was also convinced that her sister is in "on the plot" as well. I spoke to her sister and she reported that this level of confusion has happened before during previous admissions. Although patient is current confused, SLP suspects underlying cognitive deficits impacting recall, awareness and problem solving. Patient also with mild oral-motor weakness with a mild dysarthria noted but patient was 100% intelligible.  Patient would benefit from skilled SLP intervention to maximize her cognitive functioning prior to discharge.    Skilled Therapeutic Interventions          Administered a cognitive-linguistic evaluation, please see above for details. Educated patient in regards to patient's current cognitive and speech deficits and goals of skilled SLP intervention, due to confusion, patient will need reinforcement of information.   SLP Assessment  Patient will need skilled [place] [facility] during CIR admission    Recommendations  Oral Care Recommendations: Oral care BID Recommendations for Other Services: Neuropsych consult Patient destination: Home Follow up Recommendations: 24 hour supervision/assistance;Home Health SLP Equipment Recommended: None recommended by SLP    SLP Frequency 3 to 5 out of 7 days   SLP Duration  SLP Intensity  SLP Treatment/Interventions 3-4 weeks   Minumum of 1-2 x/day, 30 to 90 minutes  Cognitive remediation/compensation;Environmental controls;Internal/external aids;Speech/Language facilitation;Therapeutic Activities;Patient/family education;Functional tasks;Cueing hierarchy    Pain No/Denies pain    Short Term Goals: Week 1: SLP  Short Term Goal 1 (Week 1): Patient will demonstrate intellctual awareness and identify 2 physical and 2 cognitive deficits with Mod A multimodal cues.  SLP Short Term Goal 2 (Week 1): Patient will demonstrate functional problem solving for basic tasks with Mod A multimodal cues.  SLP Short Term Goal 3 (Week 1): Patient will demonstrate sustained attention to functional tasks for 10 minutes with Mod A verbal cues for redirection.   Refer to Care Plan for Long Term Goals  Recommendations for other services: Neuropsych  Discharge Criteria: Patient will be discharged from SLP if patient refuses treatment 3 consecutive times without medical reason, if treatment goals not met, if there is a change in medical status, if patient makes no progress towards goals or if patient is discharged from hospital.  The above assessment, treatment plan, treatment alternatives and goals were discussed and mutually agreed upon: No family available/patient unable  [place], [place] 05/03/2019, 2:42 PM

## 2019-05-03 NOTE — Progress Notes (Signed)
Physical Therapy Session Note  Patient Details  Name: BUFFY EHLER MRN: [id number] Date of Birth: 04/03/1936  Today's Date: 05/03/2019 PT Individual Time: 8592-9244 PT Individual Time Calculation (min): 32 min   Short Term Goals: Week 1:  PT Short Term Goal 1 (Week 1): Pt will perform bed mobility with mod assist PT Short Term Goal 2 (Week 1): Pt will perform bed<>chair transfer with mod assist+1 PT Short Term Goal 3 (Week 1): Pt will iniate gait training with LRAD  Skilled Therapeutic Interventions/Progress Updates:    Patient in supine reports just back to bed and fatigued.  Agreed to EOB activity with encouragement.  Performed supine to sit max A for assisting legs off EOB and scooting to EOB pt already mostly upright with trunk in bed.  Reciprocal scooting to achieve feet supported on floor.  Patient with L arm on two pillows and mod facilitation through ribs on L for trunk shortening on R combined with pushing out with L arm for weight shift and L trunk activation w/ L lateral pelvic tilt.  Patient able to move L arm forward and back few inches with support, encouragement and min facilitation on pillow.  Patient able to grip with L hand but increased time to open fingers and reported pain in index finger with gripping.  Able to flex elbow to bring hand to face part way.  Patient scoot pivot to R to get closer to [facility] with mod to max A.  Sit to supine max A for trunk and legs and with bed in trendelenberg scooting to HOB max A cues to push with legs.  Left with L arm supported two pillows at head and HOB elevated with call bell in reach and bed alarm activated.   Therapy Documentation Precautions:  Precautions Precautions: Fall Precaution Comments: O2 dependent Restrictions Weight Bearing Restrictions: No Pain: Pain Assessment Pain Scale: 0-10 Pain Score: Asleep Faces Pain Scale: Hurts little more Pain Type: Chronic pain Pain Location: Shoulder Pain Orientation: Right Pain  Descriptors / Indicators: Aching Pain Frequency: Intermittent Pain Onset: With Activity Patients Stated Pain Goal: 2 Pain Intervention(s): Repositioned    Therapy/Group: Individual Therapy  Reginia Naas  [place], PT 05/03/2019, 5:24 PM

## 2019-05-04 ENCOUNTER — Inpatient Hospital Stay (HOSPITAL_COMMUNITY): Payer: Medicare Other | Admitting: Speech Pathology

## 2019-05-04 ENCOUNTER — Inpatient Hospital Stay (HOSPITAL_COMMUNITY): Payer: Medicare Other | Admitting: Occupational Therapy

## 2019-05-04 ENCOUNTER — Inpatient Hospital Stay (HOSPITAL_COMMUNITY): Payer: Self-pay | Admitting: Physical Therapy

## 2019-05-04 ENCOUNTER — Inpatient Hospital Stay (HOSPITAL_COMMUNITY): Payer: Medicare Other

## 2019-05-04 DIAGNOSIS — R609 Edema, unspecified: Secondary | ICD-10-CM

## 2019-05-04 NOTE — Progress Notes (Signed)
[facility] PHYSICAL MEDICINE & REHABILITATION PROGRESS NOTE   Subjective/Complaints:  Patient sleeping but arouses to physical stimulation.  Initially disoriented then became oriented to situation  ROS- neg CP, SOB, N/V?D   Objective:   No results found. Recent Labs    05/03/19 0521  WBC 10.1  HGB 7.7*  HCT 25.0*  PLT 453*   Recent Labs    05/03/19 0521  NA 137  K 4.0  CL 97*  CO2 27  GLUCOSE 115*  BUN 14  CREATININE 0.69  CALCIUM 8.8*   No intake or output data in the 24 hours ending 05/04/19 1037   Physical Exam: Vital Signs Blood pressure (!) 133/53, pulse 93, temperature 97.6 F (36.4 C), temperature source Oral, resp. rate 18, height 4\' 9"  (1.448 m), weight 61.9 kg, SpO2 93 %.   General: No acute distress Mood and affect are appropriate Heart: Regular rate and rhythm no rubs murmurs or extra sounds Lungs: Clear to auscultation, breathing unlabored, no rales or wheezes Abdomen: Positive bowel sounds, soft nontender to palpation, nondistended Extremities: No clubbing, cyanosis, or edema Skin: No evidence of breakdown, no evidence of rash Neurologic: Cranial nerves II through XII intact, motor strength is 5/5 in right deltoid, bicep, tricep, grip, hip flexor, knee extensors, ankle dorsiflexor and plantar flexor 2- Left delt bi tri, 3- grip, 2- L hip/knee ext synergy Sensory exam normal sensation to light touch and proprioception in bilateral upper and lower extremities Cerebellar exam normal finger to nose to finger as well as heel to shin in bilateral upper and lower extremities Musculoskeletal: Full range of motion in all 4 extremities. No joint swelling   Assessment/Plan: 1. Functional deficits secondary to RIght pontine infarct which require 3+ hours per day of interdisciplinary therapy in a comprehensive inpatient rehab setting.  Physiatrist is providing close team supervision and 24 hour management of active medical problems listed  below.  Physiatrist and rehab team continue to assess barriers to discharge/monitor patient progress toward functional and medical goals  Care Tool:  Bathing    Body parts bathed by patient: Chest, Abdomen, Face   Body parts bathed by helper: Right arm, Left arm, Front perineal area, Buttocks, Left lower leg, Right lower leg, Left upper leg, Right upper leg     Bathing assist Assist Level: Total Assistance - Patient < 25%     Upper Body Dressing/Undressing Upper body dressing   What is the patient wearing?: Hospital gown only    Upper body assist Assist Level: Total Assistance - Patient < 25%    Lower Body Dressing/Undressing Lower body dressing      What is the patient wearing?: Incontinence brief     Lower body assist Assist for lower body dressing: Total Assistance - Patient < 25%     Toileting Toileting    Toileting assist Assist for toileting: Dependent - Patient 0%     Transfers Chair/bed transfer  Transfers assist  Chair/bed transfer activity did not occur: Safety/medical concerns  Chair/bed transfer assist level: Total Assistance - Patient < 25%     Locomotion Ambulation   Ambulation assist   Ambulation activity did not occur: Safety/medical concerns          Walk 10 feet activity   Assist  Walk 10 feet activity did not occur: Safety/medical concerns        Walk 50 feet activity   Assist Walk 50 feet with 2 turns activity did not occur: Safety/medical concerns  Walk 150 feet activity   Assist Walk 150 feet activity did not occur: Safety/medical concerns         Walk 10 feet on uneven surface  activity   Assist Walk 10 feet on uneven surfaces activity did not occur: Safety/medical concerns         Wheelchair     Assist Will patient use wheelchair at discharge?: Yes Type of Wheelchair: Manual    Wheelchair assist level: Dependent - Patient 0% Max wheelchair distance: 150 ft    Wheelchair 50 feet  with 2 turns activity    Assist        Assist Level: Dependent - Patient 0%   Wheelchair 150 feet activity     Assist     Assist Level: Dependent - Patient 0%    Medical Problem List and Plan: 1.Functional deficits and left hemiparesissecondary to right pontine infarct CIR PT, OT, SLP  2. Antithrombotics: -DVT/anticoagulation:Pharmaceutical:Lovenox -antiplatelet therapy: ASA/Plavix X 3 weeks followed by ASA. 3.Headaches/Chronic back pain/Pain Management:No current pain complaints -discussed with the patient that while we want to help control her pain, that we need to be careful not to oversedate her or cause respiratory depression. -Will change gabapentin to 100 mg HS. -Will try tramadol prn with local measuresincluding heat -Lidocain patches/heat/ice.Oxycodone not daily use per records/PMP aware reviewed. 4. Mood:LCSW to follow for evaluation and support. -antipsychotic agents: N/A 5. Neuropsych: This patientis notcapable of making decisions onherown behalf. Paranoia noted per SLP this am, "staff and kids are in on it" Review of chart shows 2 hosptializations for AMS/encephalopathy that improved with hydration Improvements occurred over 4-5 days time some question of underlying dementia, ask Neuropsych to eval  Doubt B12 due  To normal B12 level and lack of macrocytosis on differential  Minimal SVD on MRI so subcortical ischemic encephalopathy less likely  Patient less agitated/paranoid today, requires frequent reorientation 6. Skin/Wound Care:LCSW tio follow for evaluation and support. 7. Fluids/Electrolytes/Nutrition:Monitor I/O. Check lytes in am.  8. HTN: Monitor BP bid--permissive HTN and normalize in 5-7 days.  9. COPD./chronic hypoxemic respiratory failure: Question maintainence medications. Oxygen dependent.add prn neb -OOB, IS 10. Pre renal  azotemia resolved with hydration , monitor po  11. H/o depression: Continue cymbalta.  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  13. H/o B12 deficiency/Iron deficiency anemia: Hgb 8.4 at admission with mild leucocytosis. Will check CBC in am and orderFOBT. Add iron supplement. Dropped to 7.7, Likely dilutional since BUN has improved from 43 to 14 14. Dyslipidemia: Now on Lipitor.    LOS: 2 days A FACE TO [place]  05/04/2019, 10:37 AM

## 2019-05-04 NOTE — Progress Notes (Signed)
Occupational Therapy Session Note  Patient Details  Name: [name] MRN: [id number] Date of Birth: Aug 09, 1936  Today's Date: 05/04/2019 OT Individual Time: 4010-2725 OT Individual Time Calculation (min): 73 min    Short Term Goals: Week 1:  OT Short Term Goal 1 (Week 1): Pt will complete UB bathing with min assist in supported sitting. OT Short Term Goal 2 (Week 1): Pt will perform LB bathing sit to stand with mod assist.  OT Short Term Goal 3 (Week 1): Pt will complete toilet transfer with mod assist using the RW for support.  OT Short Term Goal 4 (Week 1): Pt will use the RUE for functional use with selfcare tasks and mod facilitation.   OT Short Term Goal 5 (Week 1): Pt will perform UB dressing with mod assist for pullover shirt.  Skilled Therapeutic Interventions/Progress Updates:    Pt completed bathing and dressing during session supine to sitting.  She was able to complete washing her front peri area with HOB elevated using the RUE.  She then needed max assist to wash her buttocks as well rolling side to side in the bed.  Worked on her remember the sequencing to establish and complete rolling to the right side with initiation of head and trunk.  Max assist for donning brief in supine as well before transitioning to sitting at the same level.  She then needed total assist for stand pivot transfer to the left side to the wheelchair.  Next had pt work on completion of bathing at the sink.  She needed max instructional cueing for sustained attention to task secondary to getting distracted with conversation.  Max assist for UB bathing thoroughness as well as for washing her lower legs and feet.  Total assist for all dressing sit to stand with pt using the RUE to pull up on the sink after being unsuccessful attempting to push up from the chair.  Finished session with oral hygiene in sitting with setup.  Pt with noted RLE tremor during session at times with pt stating she didn't "feel right".   Oxygen sats 95% on 3 Ls nasal cannula with HR at 95 and BP in sitting at 140/70.  Pt left up in the wheelchair with call button and phone in reach and safety alarm belt in place.  Nursing made aware of pt's concerns that she wasn't feeling right.    Therapy Documentation Precautions:  Precautions Precautions: Fall Precaution Comments: O2 dependent Restrictions Weight Bearing Restrictions: No  Pain: Pain Assessment Pain Scale: 0-10 Pain Score: 8  Pain Type: Chronic pain Pain Location: Back Pain Orientation: Lower Pain Radiating Towards: neck Pain Descriptors / Indicators: Aching;Discomfort Pain Frequency: Constant Pain Onset: On-going Patients Stated Pain Goal: 4 Pain Intervention(s): Medication (See eMAR);Heat applied ADL: See Care Tool Section for some details of ADL  Therapy/Group: Individual Therapy  Dalaina Tates OTR/L 05/04/2019, 3:47 PM

## 2019-05-04 NOTE — Progress Notes (Signed)
[facility] Individual Statement of Services  Patient Name:  EBUNOLUWA GERNERT  Date:  05/04/2019  Welcome to the [facility].  Our goal is to provide you with an individualized program based on your diagnosis and situation, designed to meet your specific needs.  With this comprehensive rehabilitation program, you will be expected to participate in at least 3 hours of rehabilitation therapies Monday-Friday, with modified therapy programming on the weekends.  Your rehabilitation program will include the following services:  Physical Therapy (PT), Occupational Therapy (OT), Speech Therapy (ST), 24 hour per day rehabilitation nursing, Neuropsychology, Case Management (Social Worker), Rehabilitation Medicine, Nutrition Services and Pharmacy Services  Weekly team conferences will be held on Wednesdays to discuss your progress.  Your Social Worker will talk with you frequently to get your input and to update you on team discussions.  Team conferences with you and your family in attendance may also be held.  Expected length of stay:  3 to 4 weeks  Overall anticipated outcome:  Minimal assistance  Depending on your progress and recovery, your program may change. Your Social Worker will coordinate services and will keep you informed of any changes. Your Social Worker's name and contact numbers are listed  below.  The following services may also be recommended but are not provided by the [facility] will be made to provide these services after discharge if needed.  Arrangements include referral to agencies that provide these services.  Your insurance has been verified to be:  [employer] Your primary doctor is:  Dr. Claris Gower  Pertinent information will be shared with your doctor and your insurance company.  Social  Worker:  Alfonse Alpers, LCSW  [phone] or [phone]  Information discussed with and copy given to patient by: Trey Sailors, 05/04/2019, 12:19 PM

## 2019-05-04 NOTE — Progress Notes (Signed)
Lower extremity venous has been completed.   Preliminary results in CV Proc.   Abram Sander 05/04/2019 3:44 PM

## 2019-05-04 NOTE — Progress Notes (Addendum)
Physical Therapy Session Note  Patient Details  Name: [name] MRN: [id number] Date of Birth: 12-Feb-1936  Today's Date: 05/04/2019 PT Individual Time: 1303-1407 PT Individual Time Calculation (min): 64 min   Short Term Goals: Week 1:  PT Short Term Goal 1 (Week 1): Pt will perform bed mobility with mod assist PT Short Term Goal 2 (Week 1): Pt will perform bed<>chair transfer with mod assist+1 PT Short Term Goal 3 (Week 1): Pt will iniate gait training with LRAD  Skilled Therapeutic Interventions/Progress Updates:    Pt received sitting in w/c with SW, Sonia Baller, present and pt agreeable to therapy session despite reporting she doesn't feel as well today as yesterday and that she is very tired. Pt on 2L of O2 via nasal cannula throughout session with SpO2 monitored via pulse oximeter of >93% throughout session and HR noted to increase to 107bpm with activity. Pt transported to/from therapy gym in w/c. Pt performed R lateral scoot/squat pivot transfer w/c to EOM with max assist for lifting/pivoting hips and trunk control - cuing for sequencing throughout and manual facilitation for increased anterior trunk weight shift. Participated in sitting balance task on EOM with mirror feedback focusing on midline weight shifting due to pt's preferential L lateral weight shift. Pt performed sit<>stand x2 from EOM to RW with max assist for lifting and manual facilitation for L LE hip/knee extension and mirror feedback for improved upright posture - pt able to tolerate standing for ~30-45 seconds each. Pt performed L lateral scoot/squat pivot transfer EOM to w/c with max assist for lifting/pivoting hips and trunk control. Therapist retrieved 16x16 w/c with specialty back and pressure relieving cushion to increase pt fit and upright activity tolerance. Therapist and tech lowered w/c to allow for improved pt fit and for LEs to reach the floor during transfers. Pt transported in w/c back to room and performed R lateral  scoot/squat pivot transfer w/c to EOB with max assist for lifting/pivoting hips and for trunk control. Performed sit to supine with max assist for trunk descent and B LE management. Pt left supine in bed with needs in reach and bed alarm on.   Therapy Documentation Precautions:  Precautions Precautions: Fall Precaution Comments: O2 dependent Restrictions Weight Bearing Restrictions: No  Pain:  During standing activity pt reports "pain all over" but unable to further clarify location or intensity despite questioning - pt also reports increased fatigue today compared to yesterday.   Therapy/Group: Individual Therapy  Tawana Scale, PT, DPT 05/04/2019, 7:49 AM

## 2019-05-04 NOTE — Progress Notes (Signed)
Speech Language Pathology Daily Session Note  Patient Details  Name: [name] MRN: [id number] Date of Birth: Jul 21, 1936  Today's Date: 05/04/2019 SLP Individual Time: 3785-8850 SLP Individual Time Calculation (min): 60 min  Short Term Goals: Week 1: SLP Short Term Goal 1 (Week 1): Patient will demonstrate intellctual awareness and identify 2 physical and 2 cognitive deficits with Mod A multimodal cues.  SLP Short Term Goal 2 (Week 1): Patient will demonstrate functional problem solving for basic tasks with Mod A multimodal cues.  SLP Short Term Goal 3 (Week 1): Patient will demonstrate sustained attention to functional tasks for 10 minutes with Mod A verbal cues for redirection.   Skilled Therapeutic Interventions:  Skilled treatment session focused on cognition goals. SLP received pt alert and upright in bed. Pt talked incessantly and benefited from redirection to current topic. Additionally, pt was oriented x 4, able to answer semi-complex yes/no questions, demonstrate selective attention, good recall of previous date's therapists' name, describe baseline information, recall information from MD during morning rounds and follow 2 step directions. Given pt's improved mentation over previous evaluation, information regarded baseline ability was gathered. At baseline, pt was sedentary but paid all bills and managed her medicine. Pt currently lives with son who performs all other tasks within household. Pt was left upright in bed, bed alarm on and all needs within reach. Continue per current plan of care.      Pain Pain Assessment Pain Score: 5   Therapy/Group: Individual Therapy  [name] 05/04/2019, 1:39 PM

## 2019-05-05 ENCOUNTER — Inpatient Hospital Stay (HOSPITAL_COMMUNITY): Payer: Medicare Other | Admitting: Physical Therapy

## 2019-05-05 ENCOUNTER — Inpatient Hospital Stay (HOSPITAL_COMMUNITY): Payer: Medicare Other | Admitting: Occupational Therapy

## 2019-05-05 ENCOUNTER — Inpatient Hospital Stay (HOSPITAL_COMMUNITY): Payer: Medicare Other | Admitting: Speech Pathology

## 2019-05-05 NOTE — Evaluation (Signed)
Speech Language Pathology Assessment and Plan  Patient Details  Name: [name] MRN: [id number] Date of Birth: 1936/07/18  SLP Diagnosis: Cognitive Impairments;Dysarthria;Dysphagia  Rehab Potential: Fair ELOS: 3-4 weeks    BESIDE SWALLOW EVALUATION  Today's Date: 05/05/2019 SLP Individual Time: 0815-0900 SLP Individual Time Calculation (min): 45 min   Problem List:  Patient Active Problem List   Diagnosis Date Noted  . Right pontine stroke ([place]) 05/02/2019  . Stroke ([place]) 05/01/2019  . Acute renal failure superimposed on stage 3 chronic kidney disease ([place]) 05/01/2019  . Acute ischemic stroke ([place]) 05/01/2019  . AKI (acute kidney injury) ([place]) 08/29/2018  . Encephalopathy acute 08/22/2018  . Chronic back pain 08/22/2018  . Vitamin B12 deficiency 08/19/2018  . Dehydration   . Tachycardia   . Altered mental state 08/18/2018  . Closed compression fracture of L1 vertebra ([facility])   . Acute cystitis without hematuria   . Pain management   . Intractable pain   . Back pain 10/10/2016  . L2 vertebral fracture ([place]) 10/10/2016  . Acute encephalopathy 12/25/2015  . Drug-induced delirium ([place]) 12/25/2015  . Acute on chronic respiratory failure ([place]) 12/25/2015  . CAP (community acquired pneumonia) 12/19/2015  . Chronic respiratory failure ([place]) 03/19/2014  . Depression 03/19/2014  . Aspiration pneumonia ([place]) 03/10/2014  . Acute respiratory failure with hypoxia ([place]) 03/10/2014  . Sepsis ([place]) 03/10/2014  . Hypokalemia 03/10/2014  . Hypertension 03/10/2014  . COPD (chronic obstructive pulmonary disease) ([place]) 03/10/2014  . Tobacco abuse 03/10/2014  . Normocytic anemia 06/15/2013  . Generalized abdominal pain 06/15/2013  . Blood in the stool 06/15/2013   Past Medical History:  Past Medical History:  Diagnosis Date  . CAP (community acquired pneumonia)    multiple admissions  . Chronic back pain 08/22/2018  . Colitis 03/19/2014  . Compression fx, lumbar spine ([place])   .  COPD (chronic obstructive pulmonary disease) ([place])   . Delirium   . Hypertension   . On home oxygen therapy 06-14-13   continuos 2.5l/m nasally-24/7  . Osteoporosis   . Sacral fracture ([place]) 2014   Past Surgical History:  Past Surgical History:  Procedure Laterality Date  . ABDOMINAL HYSTERECTOMY    . ESOPHAGOGASTRODUODENOSCOPY N/A 06/15/2013   Procedure: ESOPHAGOGASTRODUODENOSCOPY (EGD);  Surgeon: [name], MD;  Location: [facility];  Service: Endoscopy;  Laterality: N/A;  . IR GENERIC HISTORICAL  10/15/2016   IR KYPHO LUMBAR INC FX REDUCE BONE BX UNI/BIL CANNULATION INC/IMAGING 10/15/2016 Luanne Bras, MD [facility]  . sacroplasty  06-14-13   05-10-13 IVR [facility] for fracture stabilization    Assessment / Plan / Recommendation Clinical Impression   Pt presents with mild oropharyngeal dysphagia. Pt does present with mild left facial weakness which contribute to increased mastication with solid meat (sausage) that is further severely complicated by incessant talking throughout consumption. Pt with decreased attention to bolus d/t attention to talking despite Total A cues to cease talking. Additionally, pt with intermittent coughing d/t discoordination between attempts to talk and manage oral boluses (food or liquids). Pt with decreased mastication when consuming dysphagia 3 trials and becasue of increased ability with dysphagia 3, pt would be most appropriate for intermittent supervision (after set-up) which would also decrease talking while eating. Pt with no overt s/s of aspiration when consuming thin liquids and on previous date pt consumed medicine whole with thin liquids, therefore no changes are required in this area. Additionally, at baseline pt consumed potato chips, M&Ms and Sprite Zero.  Skilled Therapeutic Interventions          Skilled treatment session focused on dysphagia and cognition goals. SLP facilitated session by providing Total A cues for insight into  overt s/s of aspiration and inability to cease talking while consuming. Pt's inability to inhibit talking makes implementing therapeutic tasks very difficult as pt is passive participate instead of active participant in therapy.     SLP Assessment  Patient will need skilled Speech Lanaguage Pathology Services during CIR admission    Recommendations  SLP Diet Recommendations: Dysphagia 3 (Mech soft);Thin Liquid Administration via: Cup;Straw Medication Administration: Whole meds with liquid Supervision: Patient able to self feed;Intermittent supervision to cue for compensatory strategies Compensations: Minimize environmental distractions;Slow rate;Small sips/bites Postural Changes and/or Swallow Maneuvers: Seated upright 90 degrees Oral Care Recommendations: Oral care BID Recommendations for Other Services: Neuropsych consult Patient destination: Home Follow up Recommendations: 24 hour supervision/assistance;Home Health SLP Equipment Recommended: None recommended by SLP    SLP Frequency 3 to 5 out of 7 days   SLP Duration  SLP Intensity  SLP Treatment/Interventions 3-4 weeks   Minumum of 1-2 x/day, 30 to 90 minutes       Pain Pain Assessment Pain Scale: Faces Pain Score: 0-No pain  Prior Functioning    Short Term Goals: Week 1: SLP Short Term Goal 1 (Week 1): Patient will demonstrate intellctual awareness and identify 2 physical and 2 cognitive deficits with Mod A multimodal cues.  SLP Short Term Goal 2 (Week 1): Patient will demonstrate functional problem solving for basic tasks with Mod A multimodal cues.  SLP Short Term Goal 3 (Week 1): Patient will demonstrate sustained attention to functional tasks for 10 minutes with Mod A verbal cues for redirection.  SLP Short Term Goal 4 (Week 1): Pt will consume least restrictive diet with minimal overt s/s of aspiration to reduce risk of choking.   Refer to Care Plan for Long Term Goals  Recommendations for other services:  Neuropsych  Discharge Criteria: Patient will be discharged from SLP if patient refuses treatment 3 consecutive times without medical reason, if treatment goals not met, if there is a change in medical status, if patient makes no progress towards goals or if patient is discharged from hospital.  The above assessment, treatment plan, treatment alternatives and goals were discussed and mutually agreed upon: by patient  Rollin Kotowski 05/05/2019, 12:48 PM

## 2019-05-05 NOTE — Progress Notes (Signed)
Social Work Patient ID: [name], female   DOB: 10/22/1936, 82 y.o.   MRN: [id number]   CSW met with pt to update her on team conference discussion and that she will need to be on CIR for about 3 weeks.  CSW notified pt's son via telephone and he expressed understanding. He will be with pt when she comes home.  CSW will continue to follow and assist as needed.  

## 2019-05-05 NOTE — Progress Notes (Signed)
Social Work Assessment and Plan  Patient Details  Name: [name] MRN: [id number] Date of Birth: [birth year]  Today's Date: 05/03/2019  Problem List:  Patient Active Problem List   Diagnosis Date Noted  . Right pontine stroke ([name]) 05/02/2019  . Stroke ([name]) 05/01/2019  . Acute renal failure superimposed on stage 3 chronic kidney disease ([name]) 05/01/2019  . Acute ischemic stroke ([name]) 05/01/2019  . AKI (acute kidney injury) ([name]) 08/29/2018  . Encephalopathy acute 08/22/2018  . Chronic back pain 08/22/2018  . Vitamin B12 deficiency 08/19/2018  . Dehydration   . Tachycardia   . Altered mental state 08/18/2018  . Closed compression fracture of L1 vertebra ([name])   . Acute cystitis without hematuria   . Pain management   . Intractable pain   . Back pain 10/10/2016  . L2 vertebral fracture ([name]) 10/10/2016  . Acute encephalopathy 12/25/2015  . Drug-induced delirium ([name]) 12/25/2015  . Acute on chronic respiratory failure ([place]) 12/25/2015  . CAP (community acquired pneumonia) 12/19/2015  . Chronic respiratory failure ([place]) 03/19/2014  . Depression 03/19/2014  . Aspiration pneumonia ([place]) 03/10/2014  . Acute respiratory failure with hypoxia ([place]) 03/10/2014  . Sepsis ([place]) 03/10/2014  . Hypokalemia 03/10/2014  . Hypertension 03/10/2014  . COPD (chronic obstructive pulmonary disease) ([place]) 03/10/2014  . Tobacco abuse 03/10/2014  . Normocytic anemia 06/15/2013  . Generalized abdominal pain 06/15/2013  . Blood in the stool 06/15/2013   Past Medical History:  Past Medical History:  Diagnosis Date  . CAP (community acquired pneumonia)    multiple admissions  . Chronic back pain 08/22/2018  . Colitis 03/19/2014  . Compression fx, lumbar spine ([name])   . COPD (chronic obstructive pulmonary disease) ([name])   . Delirium   . Hypertension   . On home oxygen therapy 06-14-13   continuos 2.5l/m nasally-24/7  . Osteoporosis   . Sacral fracture ([name]) 2014   Past Surgical History:   Past Surgical History:  Procedure Laterality Date  . ABDOMINAL HYSTERECTOMY    . ESOPHAGOGASTRODUODENOSCOPY N/A 06/15/2013   Procedure: ESOPHAGOGASTRODUODENOSCOPY (EGD);  Surgeon: Adriana Ng, [name];  [name];  Service: Endoscopy;  Laterality: N/A;  . IR GENERIC HISTORICAL  10/15/2016   IR KYPHO LUMBAR INC FX REDUCE BONE BX UNI/BIL CANNULATION INC/IMAGING 10/15/2016 Adriana Bras, [name]  . sacroplasty  06-14-13   05-10-13 IVR CONE for fracture stabilization   Social History:  reports that she quit smoking about 7 years ago. Her smoking use included cigarettes. She has never used smokeless tobacco. She reports that she does not drink alcohol or use drugs.  Family / Support Systems Marital Status: Divorced Patient Roles: Parent, Other (Comment)(sister) Children: [name] - son - [phone] Other Supports: [name] - sister - [phone] Anticipated Caregiver: [name] Ability/Limitations of Caregiver: no limitations Caregiver Availability: 24/7 Family Dynamics: supportive son  Social History Preferred language: English Religion: Baptist Read: Yes Write: Yes Employment Status: Retired Public relations account executive Issues: none reported Guardian/Conservator: [name] reported that pt is not capable of making her own decisions at this time.   Abuse/Neglect Abuse/Neglect Assessment Can Be Completed: Yes Physical Abuse: Denies Verbal Abuse: Denies Sexual Abuse: Denies Exploitation of patient/patient's resources: Denies Self-Neglect: Denies  Emotional Status Pt's affect, behavior and adjustment status: Pt is tearful about being on [name] and wants to go home, but does acknowledge she needs to be here.  She's upset this has happened to her.  Other team members report paranoid behavior and listed pt's  suspicions.  This was not present during CSW's visit, with the exception of pt talking about someone standing in the corner of her room near the  window.  Pt pointed in the direction of the computer and it was likely this is when staff came in during the night and documented their visit at the computer. Recent Psychosocial Issues: Pt has had other hospitalizations for AMS, which [name] found correlated with dehydration.  Pt reports financial hardships with she and her son both living off her SS. Psychiatric History: None reported, but pt is on [name] while in hospital and neuropsychologist to see pt for evaluation of dementia and for coping support. Substance Abuse History: none reported  Patient / Family Perceptions, Expectations & Goals Pt/Family understanding of illness & functional limitations: Pt/son report a good understanding of pt's condition, although son can't see it for himself, he realizes she will need physical care when she is discharged. Premorbid pt/family roles/activities: Pt was mod I with rolling walker PTA.  Her son helped her with errands and does all upkeep of the house.  Pt likes to watch TV. Anticipated changes in roles/activities/participation: Pt would like to return home and resume activities as she is able. Pt/family expectations/goals: Pt just wants to get better and go home.  Community [employer] Agencies: None Premorbid Home Care/DME Agencies: Other (Comment)(Pt has a walker, single point cane, bedside commode, and rollator) Transportation available at discharge: son Resource referrals recommended: Neuropsychology, Support group (specify)(stroke support group)  Discharge Planning Living Arrangements: Children Support Systems: Children, Other relatives Type of Residence: Private residence Insurance Resources: Multimedia programmer (specify)([employer] loss adjuster) Financial Resources: Radio broadcast assistant Screen Referred: No Money Management: Patient, Family Does the patient have any problems obtaining your medications?: No Home Management: son takes care of most of  this Patient/Family Preliminary Plans: Pt plans to return to her home where her son will provide min A care for her. Social Work Anticipated Follow Up Needs: HH/OP, Support Group Expected length of stay: 3 weeks  Clinical Impression CSW met with pt to introduce self and role of CSW, as well as to complete assessment.  Pt was pretty open with CSW after CSW explained that every pt has a CSW.  She pointedly asked why she had a Education officer, museum.  CSW spent time with pt listening to her concerns and how badly she wanted to go home, but she was aware of her need to be where she was.  She was worried they were going to send her to a nursing home and CSW explained that's why it's good she got to come to CIR instead and explained the difference.  Pt seemed to understand and softened with CSW.  She expressed some financial concerns with her and her son living off just her SS income.  CSW listened and will f/u with pt about this again. CSW talked to pt's son, [name], via telephone and he is prepared to care for pt at home. Pt was crying with CSW and support and encouragement given.  CSW referred pt to neuropsychologist.  Pt asked if CSW would visit again and CSW told her yes, will continue to follow and assist as needed.  Laurell Coalson, Silvestre Mesi 05/05/2019, 4:24 PM

## 2019-05-05 NOTE — Progress Notes (Signed)
Occupational Therapy Session Note  Patient Details  Name: [name] MRN: [id number] Date of Birth: Mar 29, 1936  Today's Date: 05/05/2019 OT Individual Time: 1019-1130 OT Individual Time Calculation (min): 71 min    Short Term Goals: Week 1:  OT Short Term Goal 1 (Week 1): Pt will complete UB bathing with min assist in supported sitting. OT Short Term Goal 2 (Week 1): Pt will perform LB bathing sit to stand with mod assist.  OT Short Term Goal 3 (Week 1): Pt will complete toilet transfer with mod assist using the RW for support.  OT Short Term Goal 4 (Week 1): Pt will use the RUE for functional use with selfcare tasks and mod facilitation.   OT Short Term Goal 5 (Week 1): Pt will perform UB dressing with mod assist for pullover shirt.  Skilled Therapeutic Interventions/Progress Updates:    Pt completed supine to sit EOB with max assist.  She was able to recall the steps to roll to the right side with therapist assist and min instructional cueing.  She needed mod assist for static sitting balance secondary to LOB to the left.  She then completed transfer stand pivot from the bed to wheelchair with max assist.  Worked on donning pullover shirt with max assist and then donned pants with max assist sit to stand.  She was able to complete sit to stand from the wheelchair with use of the sink to pull up with the RUE.  Therapist provided max assist for pulling pants and brief over her hips.  Pt left in the wheelchair at end of session with call button and phone in reach with safety alarm belt in place.  Pt moves slowly with all tasks and remains very talkative during session, requiring mod instructional cueing for sustained attention.    Therapy Documentation Precautions:  Precautions Precautions: Fall Precaution Comments: O2 dependent Restrictions Weight Bearing Restrictions: No  Pain: Pain Assessment Pain Scale: Faces Pain Score: 0-No pain ADL: See Care Tool Section for some details of  ADL  Therapy/Group: Individual Therapy  Payslie Mccaig OTR/L 05/05/2019, 12:20 PM

## 2019-05-05 NOTE — IPOC Note (Signed)
Overall Plan of Care [facility]) Patient Details Name: [name] MRN: [id number] DOB: Apr 10, 1936  Admitting Diagnosis: <principal problem not specified>  Hospital Problems: Active Problems:   Right pontine stroke [facility])     Functional Problem List: Nursing Bladder, Bowel, Endurance, Medication Management, Nutrition, Pain, Safety  PT Balance, Behavior, Endurance, Motor, Sensory, Safety, Perception, Pain, Skin Integrity  OT Balance, Motor, Sensory, Endurance, Perception, Safety, Cognition  SLP Cognition  TR         Basic ADL's: OT Eating, Grooming, Dressing, Toileting, Bathing     Advanced  ADL's: OT       Transfers: PT Bed Mobility, Bed to Chair, Car, Manufacturing systems engineer, Metallurgist: PT Ambulation, Emergency planning/management officer, Stairs     Additional Impairments: OT    SLP Social Cognition   Social Interaction, Problem Solving, Memory, Attention, Awareness  TR      Anticipated Outcomes Item Anticipated Outcome  Self Feeding setup  Swallowing      Basic self-care  min assist  Toileting  min assist   Bathroom Transfers min to mod assist  Bowel/Bladder  Min assist to [facility] or toilet  Transfers  min assist  Locomotion  min assist  Communication  Supervision   Cognition  Min A  Pain  <4 on a scale of 1-10  Safety/Judgment  mod assist with 2 people assisting with transfers   Therapy Plan: PT Intensity: Minimum of 1-2 x/day ,45 to 90 minutes PT Frequency: 5 out of 7 days PT Duration Estimated Length of Stay: 3-4 weeks OT Intensity: Minimum of 1-2 x/day, 45 to 90 minutes OT Frequency: 5 out of 7 days OT Duration/Estimated Length of Stay: 21-23 days SLP Intensity: Minumum of 1-2 x/day, 30 to 90 minutes SLP Frequency: 3 to 5 out of 7 days SLP Duration/Estimated Length of Stay: 3-4 weeks    Due to the current state of emergency, patients may not be receiving their 3-hours of Medicare-mandated therapy.   Team Interventions: Nursing Interventions  Patient/Family Education, Bowel Management, Pain Management, Bladder Management, Psychosocial Support, Disease Management/Prevention, Medication Management, Discharge Planning  PT interventions Ambulation/gait training, Balance/vestibular training, Cognitive remediation/compensation, Community reintegration, Discharge planning, Disease management/prevention, DME/adaptive equipment instruction, Functional electrical stimulation, Neuromuscular re-education, Patient/family education, Stair training, Therapeutic Exercise, UE/LE Coordination activities, Wheelchair propulsion/positioning, Skin care/wound management, Functional mobility training, Pain management, Splinting/orthotics, Therapeutic Activities, UE/LE Strength taining/ROM, Visual/perceptual remediation/compensation, Psychosocial support  OT Interventions Training and development officer, Cognitive remediation/compensation, Community reintegration, Disease mangement/prevention, Neuromuscular re-education, Patient/family education, Self Care/advanced ADL retraining, Therapeutic Exercise, UE/LE Coordination activities, DME/adaptive equipment instruction, Functional mobility training, Therapeutic Activities, UE/LE Strength taining/ROM  SLP Interventions Cognitive remediation/compensation, Environmental controls, Internal/external aids, Speech/Language facilitation, Therapeutic Activities, Patient/family education, Functional tasks, Cueing hierarchy  TR Interventions    SW/CM Interventions Discharge Planning, Psychosocial Support, Patient/Family Education   Barriers to Discharge MD  Medical stability  Nursing Inaccessible home environment, Decreased caregiver support, Home environment access/layout, Incontinence, Lack of/limited family support, Medication compliance, Nutrition means    PT Home environment access/layout, Behavior    OT      SLP      SW       Team Discharge Planning: Destination: PT-Home ,OT- Home , SLP-Home Projected Follow-up:  PT-Home health PT, OT-  24 hour supervision/assistance, SLP-24 hour supervision/assistance, Home Health SLP Projected Equipment Needs: PT-To be determined, OT- 3 in 1 bedside comode, SLP-None recommended by SLP Equipment Details: PT- , OT-  Patient/family involved in discharge planning: PT- Patient,  OT-Patient, SLP-Patient  MD ELOS: 21-24d Medical Rehab Prognosis:  Good Assessment:  83 year old femle with history of COPD- oxygen dependent, HTN, chronic back pain who was admitted on 05/01/19 with slurred speech, left sided weakness and difficulty walking. 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 MRI brain showed acute right pontine infarct. 2D echo with bubble study showed EF 60-65% with normal systolic function andnegative bubble study.Dr. Erlinda Hong felt that stroke was due to small vessel disease and recommended DAPT folowed by ASA alone and outpatient follow up with [facility] for monitoring of asymptomatic L-ICA stenosis. Patient with resultant left sided weakness with poor postural control at EOB, mild cognitive deficits and moderate flaccid dysarthria affecting mobility and self care   Now requiring 24/7 Rehab RN,MD, as well as CIR level PT, OT and SLP.  Treatment team will focus on ADLs and mobility with goals set at [facility] A See Team Conference Notes for weekly updates to the plan of care

## 2019-05-05 NOTE — Progress Notes (Signed)
Pharmacy is unable to confirm the medications the patient was taking at home. All options have been exhausted and a resolution to the situation is not expected.   Patient's son, [name] [phone], looks after her medications. After multiple attempts and voicemails, we have not been able to get in touch with him.  Where possible, their outpatient pharmacy(s) have been contacted for the last time prescriptions were filled and that information has been added to each medication in an Order Note (highlighted yellow below the medication).  Please contact pharmacy if further assistance is needed.   Romeo Rabon, PharmD. Mobile: [phone]. 05/05/2019,9:19 AM.

## 2019-05-05 NOTE — Patient Care Conference (Addendum)
Inpatient RehabilitationTeam Conference and Plan of Care Update Date: 05/03/2019   Time: 11:25 AM    Patient Name: [name]      Medical Record Number: [id number]  Date of Birth: [birth year] Sex: Female         Room/Bed: 4W10C/4W10C-01 Payor Info: Payor: Theme park manager MEDICARE / Plan: UHC MEDICARE / Product Type: *No Product type* /    Admitting Diagnosis: cva  Admit Date/Time:  05/02/2019  5:41 PM Admission Comments: No comment available   Primary Diagnosis:  <principal problem not specified> Principal Problem: <principal problem not specified>  Patient Active Problem List   Diagnosis Date Noted  . Right pontine stroke ([place]) 05/02/2019  . Stroke ([place]) 05/01/2019  . Acute renal failure superimposed on stage 3 chronic kidney disease ([place]) 05/01/2019  . Acute ischemic stroke ([place]) 05/01/2019  . AKI (acute kidney injury) ([place]) 08/29/2018  . Encephalopathy acute 08/22/2018  . Chronic back pain 08/22/2018  . Vitamin B12 deficiency 08/19/2018  . Dehydration   . Tachycardia   . Altered mental state 08/18/2018  . Closed compression fracture of L1 vertebra ([facility])   . Acute cystitis without hematuria   . Pain management   . Intractable pain   . Back pain 10/10/2016  . L2 vertebral fracture ([place]) 10/10/2016  . Acute encephalopathy 12/25/2015  . Drug-induced delirium ([place]) 12/25/2015  . Acute on chronic respiratory failure ([place]) 12/25/2015  . CAP (community acquired pneumonia) 12/19/2015  . Chronic respiratory failure ([place]) 03/19/2014  . Depression 03/19/2014  . Aspiration pneumonia ([place]) 03/10/2014  . Acute respiratory failure with hypoxia ([place]) 03/10/2014  . Sepsis ([place]) 03/10/2014  . Hypokalemia 03/10/2014  . Hypertension 03/10/2014  . COPD (chronic obstructive pulmonary disease) ([place]) 03/10/2014  . Tobacco abuse 03/10/2014  . Normocytic anemia 06/15/2013  . Generalized abdominal pain 06/15/2013  . Blood in the stool 06/15/2013    Expected Discharge Date: Expected  Discharge Date: (3 weeks)  Team Members Present: Physician leading conference: Dr. Alysia Penna Social Worker Present: Alfonse Alpers, LCSW Nurse Present: Leonette Nutting, RN PT Present: Michaelene Song, PT OT Present: Clyda Greener, OT SLP Present: Charolett Bumpers, SLP PPS Coordinator present : Gunnar Fusi     Current Status/Progress Goal Weekly Team Focus  Medical   Patient paranoid and confused, prior medical admissions for altered mental status associated with dehydration.  Hydration status has improved, left hemiparesis pontine infarct  Maintain medical stability, reduce fall risk, improve mental status  Initiate rehabilitation program   Bowel/Bladder   Pt continent/incontinent of bowel and baldder.          Swallow/Nutrition/ Hydration             ADL's   eval pending         Mobility   max assist for bed mobility, Max assist +2 for stand pivot transfer, unable to initiate gait  min assist for transfers and gait 20 ft  sitting balance, standing, gait initiation, transfers, cognition, awareness   Communication             Safety/Cognition/ Behavioral Observations  Mod-Max A   Min A   awareness, participation    Pain             Skin              Rehab Goals Patient on target to meet rehab goals: Yes Rehab Goals Revised: none - pt's first conference on day of evaluation *See Care Plan and progress notes for long and short-term  goals.     Barriers to Discharge  Current Status/Progress Possible Resolutions Date Resolved   Physician    Medical stability     Evaluations in progress  Neuropsych consult ordered      Nursing  Inaccessible home environment;Decreased caregiver support;Home environment access/layout;Incontinence;Lack of/limited family support;Medication compliance;Nutrition means               PT  Home environment access/layout;Behavior                 OT                  SLP (behavior - incessant talking)              SW                 Discharge Planning/Teaching Needs:  Pt to return to her home where her son will assist her at min A level.  Son to come in for family education closer to d/c.   Team Discussion:  Pt with right pontine stroke who has had some mental changes, but Dr. Letta Pate stated the stroke should not be causing this.  This has happened to pt in the past and it has been dehydration.  Staff to be mindful of this and will monitor to watch for her to clear.  Stroke caused weakness and pt will need intensive therapy and hopefully she will trust team and let us do that.  Dr. Letta Pate asked that pt see neuropsychologist and assess pt for dementia.  Pt came around with PT and trusts her.  Pt shared with her the strange things she's seeing and believes.  Revisions to Treatment Plan:  none    Continued Need for Acute Rehabilitation Level of Care: The patient requires daily medical management by a physician with specialized training in physical medicine and rehabilitation for the following conditions: Daily direction of a multidisciplinary physical rehabilitation program to ensure safe treatment while eliciting the highest outcome that is of practical value to the patient.: Yes Daily medical management of patient stability for increased activity during participation in an intensive rehabilitation regime.: Yes Daily analysis of laboratory values and/or radiology reports with any subsequent need for medication adjustment of medical intervention for : Neurological problems   I attest that I was present, lead the team conference, and concur with the assessment and plan of the team.Team conference was held via web/ teleconference due to [place] - 19.   Yan Pankratz, Silvestre Mesi 05/05/2019, 3:48 PM

## 2019-05-05 NOTE — Progress Notes (Signed)
Physical Therapy Session Note  Patient Details  Name: [name] MRN: [id number] Date of Birth: Nov 22, 1936  Today's Date: 05/05/2019 PT Individual Time: 2841-3244 PT Individual Time Calculation (min): 75 min   Short Term Goals: Week 1:  PT Short Term Goal 1 (Week 1): Pt will perform bed mobility with mod assist PT Short Term Goal 2 (Week 1): Pt will perform bed<>chair transfer with mod assist+1 PT Short Term Goal 3 (Week 1): Pt will iniate gait training with LRAD  Skilled Therapeutic Interventions/Progress Updates:    Pt received supine in bed and agreeable to therapy session. Pt maintained on 2L of O2 during therapy session with SpO2 monitored via pulse oximeter and maintained >95% throughout and HR increasing to 105bpm with activity and decreasing to 94bpm with rest. Performed supine to sit, HOB partially elevated and bedrails available, with max assist for LE management and trunk upright. Performed L squat pivot transfer EOB to w/c with max assist for scooting hips and balance. Transported to/from therapy gym in w/c. Performed R squat pivot transfer w/c to EOM with mod assist for lifting/pivoting hips. Performed sit<>stand from EOM to RW at least 8 times with max assist for lifting progressed to mod/min assist - mirror feedback for improved upright stance and manual facilitation/tactile cuing for increased R hip/knee extension - tolerated standing ~1-2 minutes each trial with min assist progressed to CGA during static standing. Progressed to performing 2 bouts of NMR pre-gait trianing with picking up/down R LE with mod assist for balance throughout and manual facilitation for L lateral weight shifting and L LE hip/knee extension. Pt reports she was incontinent of urine while standing. Performed stand pivot transfer EOM to w/c using RW with mod/max assist for balance and L LE management when turning. Transported back to room in w/c. Performed sit<>stand w/c to RW with mod assist for lifting and  then pt able to stand using B UE support on RW with intermittent CGA for steadying while therapist performed total assist LB clothing management and peri-care. Performed L squat pivot transfer w/c to EOB with mod assist for lifting/pivoting. Sit to supine with max assist for trunk control and B LE management. Performed supine L LE ankle DF/PF x15 reps focusing on anterior tibialis activation with tactile/verbal cuing for increased DF ROM. Pt left supine in bed with needs in reach, lines intact, and bed alarm on.   Therapy Documentation Precautions:  Precautions Precautions: Fall Precaution Comments: O2 dependent Restrictions Weight Bearing Restrictions: No  Pain:  States "not very much" back pain but reports that she is having increased pain in R hand and shoulder - reports pain increases when "raising" R arm. Provided repositioning and limited R UE reaching tasks for pain management throughout session.   Therapy/Group: Individual Therapy  Tawana Scale, PT, DPT 05/05/2019, 3:49 PM

## 2019-05-05 NOTE — Progress Notes (Signed)
PHYSICAL MEDICINE & REHABILITATION PROGRESS NOTE   Subjective/Complaints:  " I need rehab"  Pt without paranoia this am  ROS- neg CP, SOB, N/V?D   Objective:   Vas [place] Lower Extremity Venous (dvt)  Result Date: 05/04/2019  Lower Venous Study Indications: Edema.  Performing Technologist: Abram Sander RVS  Examination Guidelines: A complete evaluation includes B-mode imaging, spectral Doppler, color Doppler, and power Doppler as needed of all accessible portions of each vessel. Bilateral testing is considered an integral part of a complete examination. Limited examinations for reoccurring indications may be performed as noted.  +---------+---------------+---------+-----------+----------+-------+ RIGHT    CompressibilityPhasicitySpontaneityPropertiesSummary +---------+---------------+---------+-----------+----------+-------+ CFV      Full           Yes      Yes                          +---------+---------------+---------+-----------+----------+-------+ SFJ      Full                                                 +---------+---------------+---------+-----------+----------+-------+ FV Prox  Full                                                 +---------+---------------+---------+-----------+----------+-------+ FV Mid   Full                                                 +---------+---------------+---------+-----------+----------+-------+ FV DistalFull                                                 +---------+---------------+---------+-----------+----------+-------+ PFV      Full                                                 +---------+---------------+---------+-----------+----------+-------+ POP      Full           Yes      Yes                          +---------+---------------+---------+-----------+----------+-------+ PTV      Full                                                  +---------+---------------+---------+-----------+----------+-------+ PERO     Full                                                 +---------+---------------+---------+-----------+----------+-------+   +---------+---------------+---------+-----------+----------+-------+ LEFT     CompressibilityPhasicitySpontaneityPropertiesSummary +---------+---------------+---------+-----------+----------+-------+  CFV      Full           Yes      Yes                          +---------+---------------+---------+-----------+----------+-------+ SFJ      Full                                                 +---------+---------------+---------+-----------+----------+-------+ FV Prox  Full                                                 +---------+---------------+---------+-----------+----------+-------+ FV Mid   Full                                                 +---------+---------------+---------+-----------+----------+-------+ FV DistalFull                                                 +---------+---------------+---------+-----------+----------+-------+ PFV      Full                                                 +---------+---------------+---------+-----------+----------+-------+ POP      Full           Yes      Yes                          +---------+---------------+---------+-----------+----------+-------+ PTV      Full                                                 +---------+---------------+---------+-----------+----------+-------+ PERO     Full                                                 +---------+---------------+---------+-----------+----------+-------+     Summary: Right: There is no evidence of deep vein thrombosis in the lower extremity. No cystic structure found in the popliteal fossa. Left: There is no evidence of deep vein thrombosis in the lower extremity. No cystic structure found in the popliteal fossa.  *See table(s) above for  measurements and observations. Electronically signed by Servando Snare MD on 05/04/2019 at 4:26:36 PM.    Final    Recent Labs    05/03/19 0521  WBC 10.1  HGB 7.7*  HCT 25.0*  PLT 453*   Recent Labs    05/03/19 0521  NA 137  K 4.0  CL 97*  CO2 27  GLUCOSE 115*  BUN  14  CREATININE 0.69  CALCIUM 8.8*    Intake/Output Summary (Last 24 hours) at 05/05/2019 0934 Last data filed at 05/05/2019 0910 Gross per 24 hour  Intake 450 ml  Output -  Net 450 ml     Physical Exam: Vital Signs Blood pressure (!) 135/52, pulse 76, temperature 98.6 F (37 C), resp. rate 18, height 4\' 9"  (1.448 m), weight 61.9 kg, SpO2 96 %.   General: No acute distress Mood and affect are appropriate Heart: Regular rate and rhythm no rubs murmurs or extra sounds Lungs: Clear to auscultation, breathing unlabored, no rales or wheezes Abdomen: Positive bowel sounds, soft nontender to palpation, nondistended Extremities: No clubbing, cyanosis, or edema Skin: No evidence of breakdown, no evidence of rash Neurologic: Cranial nerves II through XII intact, motor strength is 5/5 in right deltoid, bicep, tricep, grip, hip flexor, knee extensors, ankle dorsiflexor and plantar flexor 2- Left delt bi tri, 3- grip, 2- L hip/knee ext synergy Sensory exam normal sensation to light touch and proprioception in bilateral upper and lower extremities Cerebellar exam normal finger to nose to finger as well as heel to shin in bilateral upper and lower extremities Musculoskeletal: Full range of motion in all 4 extremities. No joint swelling   Assessment/Plan: 1. Functional deficits secondary to RIght pontine infarct which require 3+ hours per day of interdisciplinary therapy in a comprehensive inpatient rehab setting.  Physiatrist is providing close team supervision and 24 hour management of active medical problems listed below.  Physiatrist and rehab team continue to assess barriers to discharge/monitor patient progress toward  functional and medical goals  Care Tool:  Bathing    Body parts bathed by patient: Chest, Abdomen, Right upper leg, Face(supine to sit)   Body parts bathed by helper: Front perineal area, Buttocks, Left arm, Right arm, Left upper leg, Right lower leg, Left lower leg     Bathing assist Assist Level: Maximal Assistance - Patient 24 - 49%     Upper Body Dressing/Undressing Upper body dressing   What is the patient wearing?: Pull over shirt    Upper body assist Assist Level: Total Assistance - Patient < 25%    Lower Body Dressing/Undressing Lower body dressing      What is the patient wearing?: Incontinence brief, Pants     Lower body assist Assist for lower body dressing: Total Assistance - Patient < 25%     Toileting Toileting    Toileting assist Assist for toileting: Dependent - Patient 0%     Transfers Chair/bed transfer  Transfers assist  Chair/bed transfer activity did not occur: (squat pivot/lateral scoot)  Chair/bed transfer assist level: Maximal Assistance - Patient 25 - 49%     Locomotion Ambulation   Ambulation assist   Ambulation activity did not occur: Safety/medical concerns          Walk 10 feet activity   Assist  Walk 10 feet activity did not occur: Safety/medical concerns        Walk 50 feet activity   Assist Walk 50 feet with 2 turns activity did not occur: Safety/medical concerns         Walk 150 feet activity   Assist Walk 150 feet activity did not occur: Safety/medical concerns         Walk 10 feet on uneven surface  activity   Assist Walk 10 feet on uneven surfaces activity did not occur: Safety/medical concerns         Wheelchair     Assist Will  patient use wheelchair at discharge?: Yes Type of Wheelchair: Manual    Wheelchair assist level: Dependent - Patient 0% Max wheelchair distance: 150 ft    Wheelchair 50 feet with 2 turns activity    Assist        Assist Level: Dependent -  Patient 0%   Wheelchair 150 feet activity     Assist     Assist Level: Dependent - Patient 0%    Medical Problem List and Plan: 1.Functional deficits and left hemiparesissecondary to right pontine infarct CIR PT, OT, SLP  2. Antithrombotics: -DVT/anticoagulation:Pharmaceutical:Lovenox -antiplatelet therapy: ASA/Plavix X 3 weeks followed by ASA. 3.Headaches/Chronic back pain/Pain Management:No current pain complaints -discussed with the patient that while we want to help control her pain, that we need to be careful not to oversedate her or cause respiratory depression. -Will change gabapentin to 100 mg HS. -Will try tramadol prn with local measuresincluding heat -Lidocain patches/heat/ice.Oxycodone not daily use per records/PMP aware reviewed. 4. Mood:LCSW to follow for evaluation and support. -antipsychotic agents: N/A 5. Neuropsych: This patientis notcapable of making decisions onherown behalf. Paranoia noted per SLP this am, "staff and kids are in on it" Review of chart shows 2 hosptializations for AMS/encephalopathy that improved with hydration Improvements occurred over 4-5 days time some question of underlying dementia, ask Neuropsych to eval  Doubt B12 due  To normal B12 level and lack of macrocytosis on differential  Minimal SVD on MRI so subcortical ischemic encephalopathy less likely  Patient less agitated/paranoid today, requires frequent reorientation 6. Skin/Wound Care:LCSW tio follow for evaluation and support. 7. Fluids/Electrolytes/Nutrition:Monitor I/O. Check lytes in am.  8. HTN: Monitor BP bid--permissive HTN and normalize in 5-7 days.  9. COPD./chronic hypoxemic respiratory failure: Question maintainence medications. Oxygen dependent.add prn neb -OOB, IS 10. Pre renal azotemia resolved with hydration , monitor po  11. H/o depression: Continue  cymbalta.  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  13. H/o B12 deficiency/Iron deficiency anemia: Hgb 8.4 at admission with mild leucocytosis. Will check CBC in am and orderFOBT. Add iron supplement. Dropped to 7.7, Likely dilutional since BUN has improved from 43 to 14 14. Dyslipidemia: Now on Lipitor.    LOS: 3 days A FACE TO FACE EVALUATION WAS PERFORMED  Charlett Blake 05/05/2019, 9:34 AM

## 2019-05-06 ENCOUNTER — Inpatient Hospital Stay (HOSPITAL_COMMUNITY): Payer: Medicare Other | Admitting: Speech Pathology

## 2019-05-06 ENCOUNTER — Inpatient Hospital Stay (HOSPITAL_COMMUNITY): Payer: Medicare Other

## 2019-05-06 ENCOUNTER — Inpatient Hospital Stay (HOSPITAL_COMMUNITY): Payer: Medicare Other | Admitting: Physical Therapy

## 2019-05-06 DIAGNOSIS — R410 Disorientation, unspecified: Secondary | ICD-10-CM

## 2019-05-06 DIAGNOSIS — D508 Other iron deficiency anemias: Secondary | ICD-10-CM

## 2019-05-06 MED ORDER — DICLOFENAC SODIUM 1 % TD GEL
2.0000 g | Freq: Three times a day (TID) | TRANSDERMAL | Status: DC
  Administered 2019-05-06 – 2019-05-08 (×7): 2 g via TOPICAL
  Filled 2019-05-06: qty 100

## 2019-05-06 NOTE — Progress Notes (Signed)
[place] PHYSICAL MEDICINE & REHABILITATION PROGRESS NOTE   Subjective/Complaints:  Right hand/wrist painful, arm too sometimes. Has had this pain before. States she has been given a "cream" which seems to help  ROS: Patient denies fever, rash, sore throat, blurred vision, nausea, vomiting, diarrhea, cough, shortness of breath or chest pain, joint or back pain, headache, or mood change.    Objective:   Vas [place] Lower Extremity Venous (dvt)  Result Date: 05/04/2019  Lower Venous Study Indications: Edema.  Performing Technologist: Abram Sander RVS  Examination Guidelines: A complete evaluation includes B-mode imaging, spectral Doppler, color Doppler, and power Doppler as needed of all accessible portions of each vessel. Bilateral testing is considered an integral part of a complete examination. Limited examinations for reoccurring indications may be performed as noted.  +---------+---------------+---------+-----------+----------+-------+ RIGHT    CompressibilityPhasicitySpontaneityPropertiesSummary +---------+---------------+---------+-----------+----------+-------+ CFV      Full           Yes      Yes                          +---------+---------------+---------+-----------+----------+-------+ SFJ      Full                                                 +---------+---------------+---------+-----------+----------+-------+ FV Prox  Full                                                 +---------+---------------+---------+-----------+----------+-------+ FV Mid   Full                                                 +---------+---------------+---------+-----------+----------+-------+ FV DistalFull                                                 +---------+---------------+---------+-----------+----------+-------+ PFV      Full                                                 +---------+---------------+---------+-----------+----------+-------+ POP      Full            Yes      Yes                          +---------+---------------+---------+-----------+----------+-------+ PTV      Full                                                 +---------+---------------+---------+-----------+----------+-------+ PERO     Full                                                 +---------+---------------+---------+-----------+----------+-------+   +---------+---------------+---------+-----------+----------+-------+  LEFT     CompressibilityPhasicitySpontaneityPropertiesSummary +---------+---------------+---------+-----------+----------+-------+ CFV      Full           Yes      Yes                          +---------+---------------+---------+-----------+----------+-------+ SFJ      Full                                                 +---------+---------------+---------+-----------+----------+-------+ FV Prox  Full                                                 +---------+---------------+---------+-----------+----------+-------+ FV Mid   Full                                                 +---------+---------------+---------+-----------+----------+-------+ FV DistalFull                                                 +---------+---------------+---------+-----------+----------+-------+ PFV      Full                                                 +---------+---------------+---------+-----------+----------+-------+ POP      Full           Yes      Yes                          +---------+---------------+---------+-----------+----------+-------+ PTV      Full                                                 +---------+---------------+---------+-----------+----------+-------+ PERO     Full                                                 +---------+---------------+---------+-----------+----------+-------+     Summary: Right: There is no evidence of deep vein thrombosis in the lower extremity. No cystic  structure found in the popliteal fossa. Left: There is no evidence of deep vein thrombosis in the lower extremity. No cystic structure found in the popliteal fossa.  *See table(s) above for measurements and observations. Electronically signed by Servando Snare MD on 05/04/2019 at 4:26:36 PM.    Final    No results for input(s): WBC, HGB, HCT, PLT in the last 72 hours. No results for input(s): NA, K, CL, CO2, GLUCOSE, BUN, CREATININE, CALCIUM in the last 72 hours.  Intake/Output Summary (Last 24 hours) at 05/06/2019  1012 Last data filed at 05/06/2019 0800 Gross per 24 hour  Intake 270 ml  Output -  Net 270 ml     Physical Exam: Vital Signs Blood pressure (!) 119/57, pulse 81, temperature 97.7 F (36.5 C), temperature source Oral, resp. rate 18, height 4\' 9"  (1.448 m), weight 61.9 kg, SpO2 97 %.   Constitutional: No distress . Vital signs reviewed. HEENT: EOMI, oral membranes moist Neck: supple Cardiovascular: RRR without murmur. No JVD    Respiratory: CTA Bilaterally without wheezes or rales. Normal effort    GI: BS +, non-tender, non-distended  Extremities: No clubbing, cyanosis, or edema Skin: No evidence of breakdown, no evidence of rash Neurologic: fairly alert, able to concentrate on conversation. Cranial nerves II through XII intact, motor strength is 5/5 in right deltoid, bicep, tricep, grip, hip flexor, knee extensors, ankle dorsiflexor and plantar flexor 2- Left delt bi tri, 3- grip, 2- L hip/knee ext synergy--stable Sensory exam normal sensation to light touch and proprioception in bilateral upper and lower extremities--unchanged  Musculoskeletal: right hand/wrist somewhat sensitive to touch, also with shoulder pain during ROM of RUE Psych: no delusional behavior  Assessment/Plan: 1. Functional deficits secondary to RIght pontine infarct which require 3+ hours per day of interdisciplinary therapy in a comprehensive inpatient rehab setting.  Physiatrist is providing close team  supervision and 24 hour management of active medical problems listed below.  Physiatrist and rehab team continue to assess barriers to discharge/monitor patient progress toward functional and medical goals  Care Tool:  Bathing    Body parts bathed by patient: Chest, Abdomen, Right upper leg, Face(supine to sit)   Body parts bathed by helper: Front perineal area, Buttocks, Left arm, Right arm, Left upper leg, Right lower leg, Left lower leg     Bathing assist Assist Level: Dependent - Patient 0%     Upper Body Dressing/Undressing Upper body dressing   What is the patient wearing?: Pull over shirt    Upper body assist Assist Level: Maximal Assistance - Patient 25 - 49%    Lower Body Dressing/Undressing Lower body dressing      What is the patient wearing?: Pants, Incontinence brief     Lower body assist Assist for lower body dressing: Maximal Assistance - Patient 25 - 49%     Toileting Toileting    Toileting assist Assist for toileting: Dependent - Patient 0%     Transfers Chair/bed transfer  Transfers assist  Chair/bed transfer activity did not occur: (squat pivot/lateral scoot)  Chair/bed transfer assist level: 2 Helpers     Locomotion Ambulation   Ambulation assist   Ambulation activity did not occur: Safety/medical concerns  Assist level: 2 helpers Assistive device: Walker-rolling Max distance: 5 ft   Walk 10 feet activity   Assist  Walk 10 feet activity did not occur: Safety/medical concerns        Walk 50 feet activity   Assist Walk 50 feet with 2 turns activity did not occur: Safety/medical concerns         Walk 150 feet activity   Assist Walk 150 feet activity did not occur: Safety/medical concerns         Walk 10 feet on uneven surface  activity   Assist Walk 10 feet on uneven surfaces activity did not occur: Safety/medical concerns         Wheelchair     Assist Will patient use wheelchair at discharge?:  Yes Type of Wheelchair: Manual    Wheelchair assist level: Dependent -  Patient 0% Max wheelchair distance: 150 ft    Wheelchair 50 feet with 2 turns activity    Assist        Assist Level: Dependent - Patient 0%   Wheelchair 150 feet activity     Assist     Assist Level: Dependent - Patient 0%    Medical Problem List and Plan: 1.Functional deficits and left hemiparesissecondary to right pontine infarct CIR PT, OT, SLP ongoing 2. Antithrombotics: -DVT/anticoagulation:Pharmaceutical:Lovenox -antiplatelet therapy: ASA/Plavix X 3 weeks followed by ASA. 3.Headaches/Chronic back pain/Pain Management:No current pain complaints -discussed with the patient that while we want to help control her pain, that we need to be careful not to oversedate her or cause respiratory depression. -  Gabapentin  100 mg HS. -Will try tramadol prn with local measuresincluding heat -Lidocain patches/heat/ice.Oxycodone not daily use per records/PMP aware reviewed.  -try voltaren gel Right hand/wrist   4. Mood:LCSW to follow for evaluation and support. -antipsychotic agents: N/A 5. Neuropsych: This patientis notcapable of making decisions onherown behalf.   -some improvement  -probably underlying cognitive deficits at baseline  -neuropsych to see  -continue to support pt at bedside  6. Skin/Wound Care:LCSW tio follow for evaluation and support. 7. Fluids/Electrolytes/Nutrition:Monitor I/O. Check lytes in am.  8. HTN: Monitor BP bid--permissive HTN and normalize in 5-7 days.  9. COPD./chronic hypoxemic respiratory failure: Question maintainence medications. Oxygen dependent.add prn neb -OOB, IS 10. Pre renal azotemia resolved with hydration  -still not eating much  -push po  -recheck labs in AM 11. H/o depression: Continue cymbalta.  12. L-ICA stenosis: To follow up with  vascular after discharge.  13. H/o B12 deficiency/Iron deficiency anemia: Hgb 8.4 at admission with mild leucocytosis.   Added iron supplement.  -most recent hgb 7.7, Likely dilutional since BUN has improved from 43 to 14  -recheck tomorrow  -B12 supp 14. Dyslipidemia: Now on Lipitor.    LOS: 4 days A FACE TO [place] 05/06/2019, 10:12 AM

## 2019-05-06 NOTE — Progress Notes (Signed)
Occupational Therapy Session Note  Patient Details  Name: [name] MRN: [id number] Date of Birth: [birth year]  Today's Date: 05/06/2019 OT Individual Time: 1583-0940 OT Individual Time Calculation (min): 58 min    Short Term Goals: Week 1:  OT Short Term Goal 1 (Week 1): Pt will complete UB bathing with min assist in supported sitting. OT Short Term Goal 2 (Week 1): Pt will perform LB bathing sit to stand with mod assist.  OT Short Term Goal 3 (Week 1): Pt will complete toilet transfer with mod assist using the RW for support.  OT Short Term Goal 4 (Week 1): Pt will use the RUE for functional use with selfcare tasks and mod facilitation.   OT Short Term Goal 5 (Week 1): Pt will perform UB dressing with mod assist for pullover shirt.  Skilled Therapeutic Interventions/Progress Updates:    1;1. Pt revieved in bed with no pain reported. Pt verbose and requires redirection. Pt agreeable to attempt toileting. Doffed brief prior to stand pivot for ease of clothing management. Pt completes squat pivot transfer EOB<>DAC<>w/c with MAX A and R knee block with VC for hand placement. Pt sit to stand in stedy with MAX A for terminal hip extension while OT cleanses buttocks of small BM and advancing pants/clean brief past hips. Stedy utilized to transfer back to bed. Pt dresses in w/c with MAX A for shirt EOB with MIN A overall for sitting balance and tactile cues for postural alignment. Exited session with pt returned to bed, exit alarm on, and call light in reach  Therapy Documentation Precautions:  Precautions Precautions: Fall Precaution Comments: O2 dependent Restrictions Weight Bearing Restrictions: No General:   Vital Signs: Therapy Vitals Temp: 98.7 F (37.1 C) Temp Source: Oral Pulse Rate: 82 Resp: 18 BP: (!) 116/52 Patient Position (if appropriate): Sitting Oxygen Therapy SpO2: 92 % O2 Device: Nasal Cannula Pain:   ADL: ADL Eating: Set up Where Assessed-Eating:  Wheelchair Grooming: Minimal assistance Where Assessed-Grooming: Wheelchair Upper Body Bathing: Maximal assistance Where Assessed-Upper Body Bathing: Wheelchair Lower Body Bathing: Dependent Where Assessed-Lower Body Bathing: Bed level Upper Body Dressing: Maximal assistance Where Assessed-Upper Body Dressing: Wheelchair Lower Body Dressing: Dependent Where Assessed-Lower Body Dressing: Bed level Toileting: Dependent Where Assessed-Toileting: Bed level Toilet Transfer: Dependent Toilet Transfer Method: Stand pivot Science writer: Art gallery manager    Praxis   Exercises:   Other Treatments:     Therapy/Group: Individual Therapy  Tonny Branch 05/06/2019, 2:39 PM

## 2019-05-06 NOTE — Progress Notes (Signed)
Physical Therapy Session Note  Patient Details  Name: [name] MRN: [id number] Date of Birth: [birth year]  Today's Date: 05/06/2019 PT Individual Time: 8563-1497 PT Individual Time Calculation (min): 42 min   Short Term Goals: Week 1:  PT Short Term Goal 1 (Week 1): Pt will perform bed mobility with mod assist PT Short Term Goal 2 (Week 1): Pt will perform bed<>chair transfer with mod assist+1 PT Short Term Goal 3 (Week 1): Pt will iniate gait training with LRAD  Skilled Therapeutic Interventions/Progress Updates:   Pt received supine in bed and agreeable to therapy session despite reporting she feels exhausted. Performed supine to sit, HOB elevated and bedrails available, with mod/max assist for trunk control and pivoting hips to EOB. Pt performed sit<>stand from EOB to RW x2 with mod assist for lifting to come to standing and then CGA for static standing. Performed repeated R LE pick up/down with manual facilitation/tactile cuing for sustained L LE hip/knee extension and mod assist for balance due to L lateral lean - cuing for upright stance. Performed L UE NMR focusing on active elbow flexion/extension and finger flexion/extension with assist for increased ROM - multimodal cuing throughout for increased muscle activation. Performed R squat pivot transfer EOB>w/c with mod assist for lifting/pivoting hips. Pt left sitting in w/c with needs in reach, seat belt alarm on, and meal tray set-up with nursing staff notified.  Therapy Documentation Precautions:  Precautions Precautions: Fall Precaution Comments: O2 dependent Restrictions Weight Bearing Restrictions: No  Pain:  Reports nursing staff recently applied cream to hand that helped with her pain - otherwise reports no pain during session.   Therapy/Group: Individual Therapy  Tawana Scale, PT, DPT 05/06/2019, 5:39 PM

## 2019-05-06 NOTE — Progress Notes (Signed)
Physical Therapy Session Note  Patient Details  Name: [name] MRN: [id number] Date of Birth: 01-19-1936  Today's Date: 05/06/2019 PT Individual Time: 0901-1000 PT Individual Time Calculation (min): 59 min   Short Term Goals: Week 1:  PT Short Term Goal 1 (Week 1): Pt will perform bed mobility with mod assist PT Short Term Goal 2 (Week 1): Pt will perform bed<>chair transfer with mod assist+1 PT Short Term Goal 3 (Week 1): Pt will iniate gait training with LRAD  Skilled Therapeutic Interventions/Progress Updates:    Pt supine in bed upon PT arrival, agreeable to therapy tx and reports R UE shooting pain this session, 10/10. Following therapy session therapist positioned R UE for pain relief. Pt performed rolling in each direction with max assist while donning pants. Pt transferred to sitting EOB with max assist +2, increased posterior lean in sitting with cues to correct. Pt performed sit<>stand this session from EOB with RW and mod assist +2, cues for techniques. Pt performed stand pivot to w/c with max assist +2. Pt transported to the gym. Pt ambulated x 3 ft and then x 5 ft this session with RW and max assist +2, second person for w/c follow, pt requiring assist to advance L LE 50% of the time, cues for step length and upright posture. Pt performed stand pivot this session w/c<>mat with max assist +2. Pt worked on seated balance this session and anterior weightshift to perform horseshoe toss activity, therapist providing hand over hand assist for L UE use to grasp horseshoes for neuro re-ed. Pt transported back to room and left in w/c with needs in reach and chair alarm set.   Therapy Documentation Precautions:  Precautions Precautions: Fall Precaution Comments: O2 dependent Restrictions Weight Bearing Restrictions: No     Therapy/Group: Individual Therapy  Netta Corrigan, PT, DPT 05/06/2019, 7:53 AM

## 2019-05-06 NOTE — Progress Notes (Signed)
Speech Language Pathology Daily Session Note  Patient Details  Name: [name] MRN: [id number] Date of Birth: 29-Sep-1936  Today's Date: 05/06/2019 SLP Individual Time: 0830-0900 SLP Individual Time Calculation (min): 30 min  Short Term Goals: Week 1: SLP Short Term Goal 1 (Week 1): Patient will demonstrate intellctual awareness and identify 2 physical and 2 cognitive deficits with Mod A multimodal cues.  SLP Short Term Goal 2 (Week 1): Patient will demonstrate functional problem solving for basic tasks with Mod A multimodal cues.  SLP Short Term Goal 3 (Week 1): Patient will demonstrate sustained attention to functional tasks for 10 minutes with Mod A verbal cues for redirection.  SLP Short Term Goal 4 (Week 1): Pt will consume least restrictive diet with minimal overt s/s of aspiration to reduce risk of choking.   Skilled Therapeutic Interventions:  Skilled treatment session focused on cognition goals. SLP received pt awake in bed and talking on the phone with her son. SLP spoke with son [name]) regarding pt's baseline abilities. At baseline pt talked incessantly and they "both ignored each other." Once a day, he helped pt walk around the house, she used a "chair potty" in the den for bathroom needs and he assisted with paying the bills as "they came in." He endorses that she ate primarily potato chips, M&Ms and Sprite Zero (soft solid diet). As a result of this information, SLP administered ALFA (Assessment of Language-Related Functional Activities). Pt scored within independent range on all subtests assessed (writing check was not assessed due to time constraints and son helps with this task). It should be noted that pt takes more than a reasonable amount of time to complete all tasks d/t incessant uncontrolled talking. Pt is also perseverative on topics related to "not being able to see" but is then able to perform task correctly. She frequently perseverated on pain in arm and "hopes that she  isn't having another stroke." She is able to retain information within each task and provide correct answer despite extensive talking and extended time needed to complete tasks. Pt continues to be oriented x 4 and recall general information about this writer from previous sessions etc. Will defer to primary therapist and assessment from neuropysch before changing POC.       Pain Pain Assessment Pain Scale: Faces Pain Score: 3  Faces Pain Scale: Hurts even more Pain Location: Arm Pain Orientation: Right Pain Frequency: Intermittent Pain Onset: On-going  Therapy/Group: Individual Therapy  Lataunya Ruud 05/06/2019, 12:14 PM

## 2019-05-07 NOTE — Progress Notes (Signed)
voltaren gel to right hand/wrist. PRN oxy IR and trazodone 50mg 's given at 2115. Slept good. Left PRAFO applied. Patrici Ranks A

## 2019-05-07 NOTE — Progress Notes (Signed)
[place] PHYSICAL MEDICINE & REHABILITATION PROGRESS NOTE   Subjective/Complaints:  Seems to have had a better night. Slept well. Pain in wrist/hand better with voltaren  ROS: Limited due to cognitive/behavioral   Objective:   No results found. No results for input(s): WBC, HGB, HCT, PLT in the last 72 hours. No results for input(s): NA, K, CL, CO2, GLUCOSE, BUN, CREATININE, CALCIUM in the last 72 hours.  Intake/Output Summary (Last 24 hours) at 05/07/2019 0953 Last data filed at 05/06/2019 2300 Gross per 24 hour  Intake 360 ml  Output -  Net 360 ml     Physical Exam: Vital Signs Blood pressure (!) 152/83, pulse 79, temperature 98.6 F (37 C), temperature source Oral, resp. rate 16, height 4\' 9"  (1.448 m), weight 61.9 kg, SpO2 92 %.   Constitutional: No distress . Vital signs reviewed. HEENT: EOMI, oral membranes moist Neck: supple Cardiovascular: RRR without murmur. No JVD    Respiratory: CTA Bilaterally without wheezes or rales. Normal effort    GI: BS +, non-tender, non-distended  Extremities: No clubbing, cyanosis, or edema Skin: No evidence of breakdown, no evidence of rash Neurologic: alert, still distracted however, tangential.  Cranial nerves II through XII intact, motor strength is 5/5 in right deltoid, bicep, tricep, grip, hip flexor, knee extensors, ankle dorsiflexor and plantar flexor 2- Left delt bi tri, 3- grip, 2- L hip/knee ext synergy--stable Sensory exam normal sensation to light touch and proprioception in bilateral upper and lower extremities--unchanged  Musculoskeletal: right hand/wrist somewhat sensitive to touch, also with shoulder pain during ROM of RUE Psych:sl anxious  Assessment/Plan: 1. Functional deficits secondary to RIght pontine infarct which require 3+ hours per day of interdisciplinary therapy in a comprehensive inpatient rehab setting.  Physiatrist is providing close team supervision and 24 hour management of active medical problems  listed below.  Physiatrist and rehab team continue to assess barriers to discharge/monitor patient progress toward functional and medical goals  Care Tool:  Bathing    Body parts bathed by patient: Chest, Abdomen, Right upper leg, Face(supine to sit)   Body parts bathed by helper: Front perineal area, Buttocks, Left arm, Right arm, Left upper leg, Right lower leg, Left lower leg     Bathing assist Assist Level: Dependent - Patient 0%     Upper Body Dressing/Undressing Upper body dressing   What is the patient wearing?: Pull over shirt    Upper body assist Assist Level: Maximal Assistance - Patient 25 - 49%    Lower Body Dressing/Undressing Lower body dressing      What is the patient wearing?: Pants, Incontinence brief     Lower body assist Assist for lower body dressing: Maximal Assistance - Patient 25 - 49%     Toileting Toileting    Toileting assist Assist for toileting: Total Assistance - Patient < 25%     Transfers Chair/bed transfer  Transfers assist  Chair/bed transfer activity did not occur: (squat pivot/lateral scoot)  Chair/bed transfer assist level: Moderate Assistance - Patient 50 - 74%     Locomotion Ambulation   Ambulation assist   Ambulation activity did not occur: Safety/medical concerns  Assist level: 2 helpers Assistive device: Walker-rolling Max distance: 5 ft   Walk 10 feet activity   Assist  Walk 10 feet activity did not occur: Safety/medical concerns        Walk 50 feet activity   Assist Walk 50 feet with 2 turns activity did not occur: Safety/medical concerns  Walk 150 feet activity   Assist Walk 150 feet activity did not occur: Safety/medical concerns         Walk 10 feet on uneven surface  activity   Assist Walk 10 feet on uneven surfaces activity did not occur: Safety/medical concerns         Wheelchair     Assist Will patient use wheelchair at discharge?: Yes Type of Wheelchair:  Manual    Wheelchair assist level: Dependent - Patient 0% Max wheelchair distance: 150 ft    Wheelchair 50 feet with 2 turns activity    Assist        Assist Level: Dependent - Patient 0%   Wheelchair 150 feet activity     Assist     Assist Level: Dependent - Patient 0%    Medical Problem List and Plan: 1.Functional deficits and left hemiparesissecondary to right pontine infarct CIR PT, OT, SLP ongoing 2. Antithrombotics: -DVT/anticoagulation:Pharmaceutical:Lovenox -antiplatelet therapy: ASA/Plavix X 3 weeks followed by ASA. 3.Headaches/Chronic back pain/Pain Management:No current pain complaints -Gabapentin  100 mg HS. -  tramadol prn with local measuresincluding heat -Lidocain patches/heat/ice.Oxycodone not daily use per records/PMP aware reviewed.  -trial of voltaren gel Right hand/wrist   4. Mood:LCSW to follow for evaluation and support. -antipsychotic agents: N/A 5. Neuropsych: This patientis notcapable of making decisions onherown behalf.   -some improvement  -probably underlying cognitive deficits at baseline  -neuropsych to see  -continue to support pt at bedside  6. Skin/Wound Care:LCSW tio follow for evaluation and support. 7. Fluids/Electrolytes/Nutrition:Monitor I/O.  D3 thins.  8. HTN: Monitor BP bid--permissive HTN and normalize in 5-7 days.  9. COPD./chronic hypoxemic respiratory failure: Question maintainence medications. Oxygen dependent.added prn neb -OOB, IS 10. Pre renal azotemia resolved with hydration  -ate a bit better yesterday  -push po  -recheck labs Monday 11. H/o depression: Continue cymbalta.  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  13. H/o B12 deficiency/Iron deficiency anemia: Hgb 8.4 at admission with mild leucocytosis.   Added iron supplement.  -most recent hgb 7.7, Likely dilutional since BUN  has improved from 43 to 14  -recheck tomorrow  -B12 supp 14. Dyslipidemia: Now on Lipitor.    LOS: 5 days A FACE TO [place] 05/07/2019, 9:53 AM

## 2019-05-08 ENCOUNTER — Encounter (HOSPITAL_COMMUNITY): Payer: Medicare Other | Admitting: Psychology

## 2019-05-08 ENCOUNTER — Inpatient Hospital Stay (HOSPITAL_COMMUNITY): Payer: Medicare Other

## 2019-05-08 ENCOUNTER — Inpatient Hospital Stay (HOSPITAL_COMMUNITY): Payer: Medicare Other | Admitting: Occupational Therapy

## 2019-05-08 DIAGNOSIS — R52 Pain, unspecified: Secondary | ICD-10-CM

## 2019-05-08 LAB — CBC
HCT: 24.9 % — ABNORMAL LOW (ref 36.0–46.0)
Hemoglobin: 7.4 g/dL — ABNORMAL LOW (ref 12.0–15.0)
MCH: 25.8 pg — ABNORMAL LOW (ref 26.0–34.0)
MCHC: 29.7 g/dL — ABNORMAL LOW (ref 30.0–36.0)
MCV: 86.8 fL (ref 80.0–100.0)
Platelets: 565 10*3/uL — ABNORMAL HIGH (ref 150–400)
RBC: 2.87 MIL/uL — ABNORMAL LOW (ref 3.87–5.11)
RDW: 13.9 % (ref 11.5–15.5)
WBC: 8.6 10*3/uL (ref 4.0–10.5)
nRBC: 0 % (ref 0.0–0.2)

## 2019-05-08 LAB — BASIC METABOLIC PANEL
Anion gap: 13 (ref 5–15)
BUN: 23 mg/dL (ref 8–23)
CO2: 32 mmol/L (ref 22–32)
Calcium: 8.7 mg/dL — ABNORMAL LOW (ref 8.9–10.3)
Chloride: 94 mmol/L — ABNORMAL LOW (ref 98–111)
Creatinine, Ser: 0.74 mg/dL (ref 0.44–1.00)
GFR calc Af Amer: >60 mL/min (ref >60)
GFR calc non Af Amer: >60 mL/min (ref >60)
Glucose, Bld: 107 mg/dL — ABNORMAL HIGH (ref 70–99)
Potassium: 3.9 mmol/L (ref 3.5–5.1)
Sodium: 139 mmol/L (ref 135–145)

## 2019-05-08 NOTE — Progress Notes (Signed)
Occupational Therapy Session Note  Patient Details  Name: [name] MRN: [id number] Date of Birth: April 04, 1936  Today's Date: 05/08/2019 OT Individual Time: 7915-0569 OT Individual Time Calculation (min): 74 min    Short Term Goals: Week 1:  OT Short Term Goal 1 (Week 1): Pt will complete UB bathing with min assist in supported sitting. OT Short Term Goal 2 (Week 1): Pt will perform LB bathing sit to stand with mod assist.  OT Short Term Goal 3 (Week 1): Pt will complete toilet transfer with mod assist using the RW for support.  OT Short Term Goal 4 (Week 1): Pt will use the RUE for functional use with selfcare tasks and mod facilitation.   OT Short Term Goal 5 (Week 1): Pt will perform UB dressing with mod assist for pullover shirt.  Skilled Therapeutic Interventions/Progress Updates:    Pt completed bathing and dressing from supine to sit.  Max assist for rolling side to side to remove bedpan pt was on, complete toilet hygiene, and donn new brief.  Once this was completed she transferred to the EOB with max assist.  She then performed stand pivot transfer to the left to the wheelchair in order to transition to the sink for completion of bathing tasks.  Pt continues to be very distracted and talkative, requiring max instructional cueing for sustained attention to self care tasks.  Increased pain in the right arm and hand with functional use which she describes as an electrical shock.  She also needs max hand over hand for incorporation of the LUE into bathing tasks as well.  Mod assist for sit to stand at the sink when attempting to pull her pants up, however max assist needed for all aspects of dressing.  Finished session with call button and phone in reach and safety belt in place.  Pt with limited progress with OT at this point and decreased memory from session to session regarding therapists that she has seen.    Therapy Documentation Precautions:  Precautions Precautions:  Fall Precaution Comments: O2 dependent Restrictions Weight Bearing Restrictions: No  Pain: Pain Assessment Pain Scale: Faces Pain Score: 3  Faces Pain Scale: Hurts a little bit Pain Type: Acute pain Pain Location: Generalized Pain Descriptors / Indicators: Discomfort Pain Onset: With Activity Pain Intervention(s): Emotional support;Repositioned ADL: See Care Tool for some details of ADL  Therapy/Group: Individual Therapy  Kasumi Ditullio OTR/L 05/08/2019, 12:05 PM

## 2019-05-08 NOTE — Progress Notes (Signed)
[facility] PHYSICAL MEDICINE & REHABILITATION PROGRESS NOTE   Subjective/Complaints: Patient recognizes me today.  She is aware that she has a stroke.  She is asking about discharge date.  She is complaining of pain in the right hand.  She states that it hurts in her joints.  No numbness or tingling.  No pain radiating down from the neck although she does have some chronic neck pain as well.  ROS: Limited due to cognitive/behavioral   Objective:   No results found. Recent Labs    05/08/19 0716  WBC 8.6  HGB 7.4*  HCT 24.9*  PLT 565*   Recent Labs    05/08/19 0716  NA 139  K 3.9  CL 94*  CO2 32  GLUCOSE 107*  BUN 23  CREATININE 0.74  CALCIUM 8.7*    Intake/Output Summary (Last 24 hours) at 05/08/2019 1543 Last data filed at 05/08/2019 0800 Gross per 24 hour  Intake 960 ml  Output -  Net 960 ml     Physical Exam: Vital Signs Blood pressure (!) 127/58, pulse 89, temperature 98.6 F (37 C), temperature source Oral, resp. rate 19, height 4\' 9"  (1.448 m), weight 61.9 kg, SpO2 95 %.   Constitutional: No distress . Vital signs reviewed. HEENT: EOMI, oral membranes moist Neck: supple Cardiovascular: RRR without murmur. No JVD    Respiratory: CTA Bilaterally without wheezes or rales. Normal effort    GI: BS +, non-tender, non-distended  Extremities: No clubbing, cyanosis, or edema Skin: No evidence of breakdown, no evidence of rash Neurologic: alert, still distracted however, tangential.  Cranial nerves II through XII intact, motor strength is 5/5 in right deltoid, bicep, tricep, grip, hip flexor, knee extensors, ankle dorsiflexor and plantar flexor 2- Left delt bi tri, 3- grip, 2- L hip/knee ext synergy--stable Sensory exam normal sensation to light touch and proprioception in bilateral upper and lower extremities--unchanged  Musculoskeletal: right hand/wrist somewhat sensitive to touch, also with shoulder pain during ROM of RUE Psych:sl anxious  Assessment/Plan: 1.  Functional deficits secondary to RIght pontine infarct which require 3+ hours per day of interdisciplinary therapy in a comprehensive inpatient rehab setting.  Physiatrist is providing close team supervision and 24 hour management of active medical problems listed below.  Physiatrist and rehab team continue to assess barriers to discharge/monitor patient progress toward functional and medical goals  Care Tool:  Bathing    Body parts bathed by patient: Abdomen, Face, Left upper leg, Right upper leg   Body parts bathed by helper: Right arm, Left arm, Chest, Front perineal area, Buttocks, Left lower leg, Right lower leg     Bathing assist Assist Level: Maximal Assistance - Patient 24 - 49%     Upper Body Dressing/Undressing Upper body dressing   What is the patient wearing?: Pull over shirt    Upper body assist Assist Level: Maximal Assistance - Patient 25 - 49%    Lower Body Dressing/Undressing Lower body dressing      What is the patient wearing?: Pants, Incontinence brief     Lower body assist Assist for lower body dressing: Total Assistance - Patient < 25%     Toileting Toileting    Toileting assist Assist for toileting: Total Assistance - Patient < 25%     Transfers Chair/bed transfer  Transfers assist  Chair/bed transfer activity did not occur: (squat pivot/lateral scoot)  Chair/bed transfer assist level: Maximal Assistance - Patient 25 - 49%     Locomotion Ambulation   Ambulation assist  Ambulation activity did not occur: Safety/medical concerns  Assist level: 2 helpers Assistive device: Walker-rolling Max distance: 5 ft   Walk 10 feet activity   Assist  Walk 10 feet activity did not occur: Safety/medical concerns        Walk 50 feet activity   Assist Walk 50 feet with 2 turns activity did not occur: Safety/medical concerns         Walk 150 feet activity   Assist Walk 150 feet activity did not occur: Safety/medical concerns          Walk 10 feet on uneven surface  activity   Assist Walk 10 feet on uneven surfaces activity did not occur: Safety/medical concerns         Wheelchair     Assist Will patient use wheelchair at discharge?: Yes Type of Wheelchair: Manual    Wheelchair assist level: Dependent - Patient 0% Max wheelchair distance: 150 ft    Wheelchair 50 feet with 2 turns activity    Assist        Assist Level: Dependent - Patient 0%   Wheelchair 150 feet activity     Assist     Assist Level: Dependent - Patient 0%    Medical Problem List and Plan: 1.Functional deficits and left hemiparesissecondary to right pontine infarct on 05/01/2019 CIR PT, OT, SLP ongoing 2. Antithrombotics: -DVT/anticoagulation:Pharmaceutical:Lovenox -antiplatelet therapy: ASA/Plavix X 3 weeks followed by ASA. 3.Headaches/Chronic back pain/Pain Management:Voltaren gel for hands -Gabapentin  100 mg HS. -  tramadol prn with local measuresincluding heat -Lidocain patches/heat/ice.Oxycodone not daily use per records/PMP aware reviewed.     4. Mood:LCSW to follow for evaluation and support. -antipsychotic agents: N/A 5. Neuropsych: This patientis notcapable of making decisions onherown behalf.   -some improvement  -probably underlying cognitive deficits at baseline  -neuropsych to see  -continue to support pt at bedside  6. Skin/Wound Care:LCSW tio follow for evaluation and support. 7. Fluids/Electrolytes/Nutrition:Monitor I/O.  D3 thins.  8. HTN: Monitor BP bid--permissive HTN and normalize in 5-7 days.  9. COPD./chronic hypoxemic respiratory failure: Question maintainence medications. Oxygen dependent.added prn neb -OOB, IS 10. Pre renal azotemia resolved with hydration  -ate a bit better yesterday  -push po  -recheck labs Monday 11. H/o depression: Continue cymbalta.  12. L-ICA  stenosis: To follow up with vascular after discharge.  13. H/o B12 deficiency/Iron deficiency anemia: Hgb 8.4 at admission with mild leucocytosis.   Added iron supplement.  -most recent hgb 7.4  We will check fecal occult blood   -B12 supp 14. Dyslipidemia: Now on Lipitor.    LOS: 6 days A FACE TO [place] Landon Truax 05/08/2019, 3:43 PM

## 2019-05-08 NOTE — Plan of Care (Signed)
  Problem: Consults Goal: RH STROKE PATIENT EDUCATION Description See Patient Education module for education specifics  Outcome: Progressing Goal: Nutrition Consult-if indicated Outcome: Progressing   Problem: RH BOWEL ELIMINATION Goal: RH STG MANAGE BOWEL WITH ASSISTANCE Description STG Manage Bowel with Max Assistance.  Outcome: Progressing Goal: RH STG MANAGE BOWEL W/MEDICATION W/ASSISTANCE Description STG Manage Bowel with Medication with max Assistance.  Outcome: Progressing   Problem: RH BLADDER ELIMINATION Goal: RH STG MANAGE BLADDER WITH ASSISTANCE Description STG Manage Bladder With max Assistance  Outcome: Progressing   Problem: RH SKIN INTEGRITY Goal: RH STG SKIN FREE OF INFECTION/BREAKDOWN Description Skin to remain free from breakdown while on rehab with max assist.  Outcome: Progressing Goal: RH STG MAINTAIN SKIN INTEGRITY WITH ASSISTANCE Description STG Maintain Skin Integrity With max Assistance and following following a turning and rotation schedule.  Outcome: Progressing Goal: RH STG ABLE TO PERFORM INCISION/WOUND CARE W/ASSISTANCE Description STG Able To Perform Wound/Skin breakdown Care With max Assistance.  Outcome: Progressing   Problem: RH SAFETY Goal: RH STG ADHERE TO SAFETY PRECAUTIONS W/ASSISTANCE/DEVICE Description STG Adhere to Safety Precautions With max Assistance and using appropriate safety and assistive Devices.  Outcome: Progressing   Problem: RH PAIN MANAGEMENT Goal: RH STG PAIN MANAGED AT OR BELOW PT'S PAIN GOAL Description <4 on a 0-10 pain scale.  Outcome: Progressing   Problem: RH KNOWLEDGE DEFICIT Goal: RH STG INCREASE KNOWLEDGE OF HYPERTENSION Description Patient to demonstrate knowledge on HTN medications, BP parameters, and follow-up care with the MD post discharge with mod assist from rehab staff.  Outcome: Progressing Goal: RH STG INCREASE KNOWLEDGE OF STROKE PROPHYLAXIS Description Patient to demonstrate knowledge  of Stroke prevention medications, dietary restrictions, and follow-up care with the MD post discharge with mod assist from rehab staff.  Outcome: Progressing

## 2019-05-08 NOTE — Progress Notes (Signed)
Patient noted to have open wound to left ear.  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  Patient states it has been there a long time.  Applied foam dressing to tubing for padding.  Brita Romp, RN

## 2019-05-08 NOTE — Consult Note (Signed)
Neuropsychological Consultation   Patient:   [name]   DOB:   1936/09/03  MR Number:  [id number]  Location:  [facility] [id number] [place] [address] Dept: [place]: [phone]           Date of Service:   05/08/2019  Start Time:   9 AM End Time:   10 AM  Provider/Observer:  Ilean Skill, Psy.D.       Clinical Neuropsychologist       Billing Code/Service: [phone]  Chief Complaint:    [name] is an 83 year old female with history of COPD, HTN, Chronic back pain admitted on 05/01/19 with slurred speech, left sided weakness and difficulty walking.  MRI showed acute right pontine infarct.  Dr. Erlinda Hong felt stroke was due to SVD.  Patient with resulting left sided weakness with poor postural control of EOB, mild cognitive deficits and moderate flaccid dysarthria affecting mobility and self care.    Reason for Service:  [id number] [name] is an 83 year old femle with history of COPD- oxygen dependent, HTN, chronic back pain who was admitted on 05/01/19 with slurred speech, left sided weakness and difficulty walking. 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 MRI brain showed acute right pontine infarct. 2D echo with bubble study showed EF 60-65% with normal systolic function andnegative bubble study.Dr. Erlinda Hong felt that stroke was due to small vessel disease and recommended DAPT folowed by ASA alone and outpatient follow up with [facility] for monitoring of asymptomatic L-ICA stenosis. Patient with resultant left sided weakness with poor postural control at EOB, mild cognitive deficits and moderate flaccid dysarthria affecting mobility and self care. CIR recommended due to functional deficits.   Current Status:  Patient was oriented but expressed concern and frustration with  extended hospital stay.  She displayed understanding why the rehab stay and feeling that she is improving but stated that she was having trouble with standing and mobility prior to stroke due to orthopedic issues in her back.     Behavioral Observation: [name]  presents as a 83 y.o.-year-old Right Caucasian Female who appeared her stated age. her dress was Appropriate and she was Well Groomed and her manners were Appropriate to the situation.  her participation was indicative of Appropriate and Redirectable behaviors.  There were any physical disabilities noted.  she displayed an appropriate level of cooperation and motivation.     Interactions:    Active Appropriate and Redirectable  Attention:   abnormal and attention span appeared shorter than expected for age  Memory:   abnormal; remote memory intact, recent memory impaired  Visuo-spatial:  not examined  Speech (Volume):  normal  Speech:   normal; verbos  Thought Process:  Coherent and Relevant  Though Content:  WNL; not suicidal and not homicidal  Orientation:   person, place, time/date and situation  Judgment:   Fair  Planning:   Fair  Affect:    Anxious  Mood:    Dysphoric  Insight:   Fair  Intelligence:   normal  Medical History:   Past Medical History:  Diagnosis Date  . CAP (community acquired pneumonia)    multiple admissions  . Chronic back pain 08/22/2018  . Colitis 03/19/2014  . Compression fx, lumbar spine ([place])   . COPD (chronic obstructive pulmonary disease) ([place])   .  Delirium   . Hypertension   . On home oxygen therapy 06-14-13   continuos 2.5l/m nasally-24/7  . Osteoporosis   . Sacral fracture ([place]) 2014    Family Med/Psych History:  Family History  Problem Relation Age of Onset  . Hypertension Sister   . Diabetes Mellitus II Sister     Risk of Suicide/Violence: low   Impression/DX:  [name] is an 83 year old female with history of COPD, HTN, Chronic back pain admitted on 05/01/19  with slurred speech, left sided weakness and difficulty walking.  MRI showed acute right pontine infarct.  Dr. Erlinda Hong felt stroke was due to SVD.  Patient with resulting left sided weakness with poor postural control of EOB, mild cognitive deficits and moderate flaccid dysarthria affecting mobility and self care.   Patient was oriented but expressed concern and frustration with extended hospital stay.  She displayed understanding why the rehab stay and feeling that she is improving but stated that she was having trouble with standing and mobility prior to stroke due to orthopedic issues in her back.        Electronically Signed   _______________________ Ilean Skill, Psy.D.

## 2019-05-08 NOTE — Progress Notes (Signed)
Physical Therapy Session Note  Patient Details  Name: [name] MRN: [id number] Date of Birth: [birth year]  Today's Date: 05/08/2019 PT Individual Time: 1421-1531 PT Individual Time Calculation (min): 70 min   Short Term Goals: Week 1:  PT Short Term Goal 1 (Week 1): Pt will perform bed mobility with mod assist PT Short Term Goal 2 (Week 1): Pt will perform bed<>chair transfer with mod assist+1 PT Short Term Goal 3 (Week 1): Pt will iniate gait training with LRAD  Skilled Therapeutic Interventions/Progress Updates:    Pt supine in bed upon PT arrival, agreeable to therapy tx and reports R UE pain this session described as "shooting." Pt transferred to sitting EOB with mod assist, cues for techniques and use of bedrails. Pt reports having to use the bathroom this session. Pt performed sit<>stand from EOB within the stedy with mod assist, transferred to toilet dependently using stedy lift equipment. Pt maintained standing within stedy min assist while therapist performed clothing management total assist. Pt continent of bowel and bladder this session. Sit<>stand within stedy with mod assist, min assist for standing balance while therapist performed pericare and clothing management with total assist. Pt transferred to w/c dependently using stedy. Pt transported to the gym. Pt performed squat pivot to the mat with max assist, cues for techniques. Pt performed x 3 sit<>stands this session from mat this session with RW and mod assist, working on standing tolerance, LE strength and balance. In standing pt worked on pre-gait with RW, stepping in place with each foot, min-mod assist. Pt reports feeling SOB, SpO2 95% while on 2L O2/min. Max assist squat pivot back to w/c and pt transported back to room. Max assist +2 squat pivot back to bed, pt requesting to lay down secondary to fatigue. Max assist for sit>supine. Total assist +2 to boost pt up in bed. Pt left supine with needs in reach and bed alarm set.    Therapy Documentation Precautions:  Precautions Precautions: Fall Precaution Comments: O2 dependent Restrictions Weight Bearing Restrictions: No   Therapy/Group: Individual Therapy  Netta Corrigan, PT, DPT 05/08/2019, 7:50 AM

## 2019-05-08 NOTE — Progress Notes (Addendum)
Speech Language Pathology Daily Session Note  Patient Details  Name: [name] MRN: [id number] Date of Birth: [birth year]  Today's Date: 05/08/2019 SLP Individual Time: 0800-0900 SLP Individual Time Calculation (min): 60 min  Short Term Goals: Week 1: SLP Short Term Goal 1 (Week 1): Patient will demonstrate intellctual awareness and identify 2 physical and 2 cognitive deficits with Mod A multimodal cues.  SLP Short Term Goal 2 (Week 1): Patient will demonstrate functional problem solving for basic tasks with Mod A multimodal cues.  SLP Short Term Goal 3 (Week 1): Patient will demonstrate sustained attention to functional tasks for 10 minutes with Mod A verbal cues for redirection.  SLP Short Term Goal 4 (Week 1): Pt will consume least restrictive diet with minimal overt s/s of aspiration to reduce risk of choking.   Skilled Therapeutic Interventions: Skilled ST services focused on cognitive skills. SLP set up soft touch call bell, following pt's inability to press call bell on remote. Pt supports near baseline cognitive function with baseline deficits in memory which are exacerbated from acute CVA. SLP facilitated recall of current medication name, function and dosage, pt demonstrated recall with external aid and verbal problem solving. SLP facilitated basic problem solving skills utilizing novel card task played at simplest level, pt required mod A verbal cues fade to min A verbal cues. Pt was left in room with call bell within reach and bed alarm set. ST recommends to continue skilled ST services.      Pain Pain Assessment Pain Scale: 0-10 Pain Score: 0-No pain Pain Type: Acute pain Pain Location: Back Pain Orientation: Lower Pain Descriptors / Indicators: Aching Pain Onset: Gradual Patients Stated Pain Goal: 3 Pain Intervention(s): Medication (See eMAR)  Therapy/Group: Individual Therapy  Esme Durkin  [facility] 05/08/2019, 4:33 PM

## 2019-05-09 ENCOUNTER — Inpatient Hospital Stay (HOSPITAL_COMMUNITY): Payer: Self-pay

## 2019-05-09 ENCOUNTER — Inpatient Hospital Stay (HOSPITAL_COMMUNITY): Payer: Medicare Other

## 2019-05-09 ENCOUNTER — Inpatient Hospital Stay (HOSPITAL_COMMUNITY): Payer: Medicare Other | Admitting: Occupational Therapy

## 2019-05-09 MED ORDER — DICLOFENAC SODIUM 1 % TD GEL
2.0000 g | Freq: Four times a day (QID) | TRANSDERMAL | Status: DC
  Administered 2019-05-09 – 2019-05-26 (×69): 2 g via TOPICAL
  Filled 2019-05-09 (×2): qty 100

## 2019-05-09 NOTE — Progress Notes (Signed)
Physical Therapy Session Note  Patient Details  Name: [name] MRN: [id number] Date of Birth: 04-20-1936  Today's Date: 05/09/2019 PT Individual Time: 1000-1045 PT Individual Time Calculation (min): 45 min   Short Term Goals: Week 1:  PT Short Term Goal 1 (Week 1): Pt will perform bed mobility with mod assist PT Short Term Goal 2 (Week 1): Pt will perform bed<>chair transfer with mod assist+1 PT Short Term Goal 3 (Week 1): Pt will iniate gait training with LRAD  Skilled Therapeutic Interventions/Progress Updates:    Pt supine in bed upon PT arrival, agreeable to therapy tx and reports R UE pain this session described as "shooting." Pt transferred to sitting EOB with mod assist, cues for techniques and use of bedrails. Pt reports having to use the bathroom this session. Pt performed sit<>stand from EOB within the stedy with mod assist, transferred to toilet dependently using stedy lift equipment. Pt maintained standing within stedy min assist while therapist performed clothing management total assist. Pt continent of bowel and bladder this session. Sit<>stand within stedy with mod assist, min assist for standing balance while therapist performed pericare and clothing management with total assist. Pt stood within the stedy at the sink to wash hands and to brush teeth, min-mod assist for standing balance. Pt transferred to w/c dependently using stedy. Pt performed sit<>stand with RW to don pants, mod assist. Pt left in w/c at end of session with needs in reach and chair alarm set.   Therapy Documentation Precautions:  Precautions Precautions: Fall Precaution Comments: O2 dependent Restrictions Weight Bearing Restrictions: No    Therapy/Group: Individual Therapy  Netta Corrigan, PT, DPT 05/09/2019, 7:54 AM

## 2019-05-09 NOTE — Progress Notes (Signed)
Occupational Therapy Session Note  Patient Details  Name: [name] MRN: [id number] Date of Birth: 05-21-1936  Today's Date: 05/09/2019 OT Individual Time: 0732-0800 OT Individual Time Calculation (min): 28 min    Short Term Goals: Week 1:  OT Short Term Goal 1 (Week 1): Pt will complete UB bathing with min assist in supported sitting. OT Short Term Goal 2 (Week 1): Pt will perform LB bathing sit to stand with mod assist.  OT Short Term Goal 3 (Week 1): Pt will complete toilet transfer with mod assist using the RW for support.  OT Short Term Goal 4 (Week 1): Pt will use the RUE for functional use with selfcare tasks and mod facilitation.   OT Short Term Goal 5 (Week 1): Pt will perform UB dressing with mod assist for pullover shirt.  Skilled Therapeutic Interventions/Progress Updates:    Upon entering the room, pt sleeping soundly in bed but agreeable to OT intervention. Pt oriented to self and situation but not time. Pt declined toileting and exiting the bed this session. Pt engaged in PNF movement patterns with use of L UE and min A to slide across body. Hand over hand assistance to utilize B UE's to grasp cup and bring to mouth. OT checking skin nares for skin integrity concerns with oxygen placement and readjusted. Pt remained in bed at end of session with call bell and all needed items within reach upon exiting the room.   Therapy Documentation Precautions:  Precautions Precautions: Fall Precaution Comments: O2 dependent Restrictions Weight Bearing Restrictions: No General:   Vital Signs:  Pain: Pain Assessment Pain Scale: 0-10 Pain Score: 4  Pain Type: Acute pain Pain Location: Back Pain Orientation: Lower Pain Descriptors / Indicators: Aching Pain Frequency: Intermittent Pain Onset: On-going Pain Intervention(s): Repositioned;Emotional support ADL: ADL Eating: Set up Where Assessed-Eating: Wheelchair Grooming: Minimal assistance Where Assessed-Grooming:  Wheelchair Upper Body Bathing: Maximal assistance Where Assessed-Upper Body Bathing: Wheelchair Lower Body Bathing: Dependent Where Assessed-Lower Body Bathing: Bed level Upper Body Dressing: Maximal assistance Where Assessed-Upper Body Dressing: Wheelchair Lower Body Dressing: Dependent Where Assessed-Lower Body Dressing: Bed level Toileting: Dependent Where Assessed-Toileting: Bed level Toilet Transfer: Dependent Toilet Transfer Method: Stand pivot Toilet Transfer Equipment: Bedside commode   Therapy/Group: Individual Therapy  Gypsy Decant 05/09/2019, 12:21 PM

## 2019-05-09 NOTE — Progress Notes (Signed)
Speech Language Pathology Daily Session Note  Patient Details  Name: [name] MRN: [id number] Date of Birth: 06/30/1936  Today's Date: 05/09/2019 SLP Individual Time: 0803-0900 SLP Individual Time Calculation (min): 57 min  Short Term Goals: Week 1: SLP Short Term Goal 1 (Week 1): Patient will demonstrate intellctual awareness and identify 2 physical and 2 cognitive deficits with Mod A multimodal cues.  SLP Short Term Goal 2 (Week 1): Patient will demonstrate functional problem solving for basic tasks with Mod A multimodal cues.  SLP Short Term Goal 3 (Week 1): Patient will demonstrate sustained attention to functional tasks for 10 minutes with Mod A verbal cues for redirection.  SLP Short Term Goal 4 (Week 1): Pt will consume least restrictive diet with minimal overt s/s of aspiration to reduce risk of choking.   Skilled Therapeutic Interventions: Skilled ST services focused on swallow and cognitive skills. Pt demonstrated no recognititon ST and initial recall of ST events from yesterday's session. Pt was able to recall 50% of ST events with max A binary choice cues. SLP facilitated intellectual awareness, pt listed memory deficits Mod I and left side weakness with mod A verbal cues. SLP facilitated PO consumption of dys 3 and thin breakfast tray, following instructions to limited verbal output and perform lingual sweeps on left buccal cavity to reduce pocketing, pt demonstrated no overt s/s aspiration, very mild oral residue cleared with liquid wash and required redirection to task/cease verbalization in 3 minute fading to 10 minute intervals. Pt demonstrated impairments in basic problem during self-feeding utilizing spoon instead of fork, for items that required a fork and required max A verbal cues to locate knife. SLP facilitated use of memory notebook to aid in recall of new,daily events. Pt demonstrated ability to read schedule listed in notebook with encouragement ( stating she could not  read it without her "readers", however was able to read large print) and collaborated in creating visual aid posted in room to work on attention strategies.  Pt was left in room with call bell within reach and bed alarm set. ST recommends to continue skilled ST services.      Pain Pain Assessment Pain Scale: Faces  Therapy/Group: Individual Therapy  Fayth Trefry  [facility] 05/09/2019, 7:47 AM

## 2019-05-09 NOTE — Progress Notes (Signed)
Occupational Therapy Session Note  Patient Details  Name: [name] MRN: [id number] Date of Birth: Mar 07, 1936  Today's Date: 05/09/2019 OT Individual Time: 1230-1346 OT Individual Time Calculation (min): 76 min    Short Term Goals: Week 1:  OT Short Term Goal 1 (Week 1): Pt will complete UB bathing with min assist in supported sitting. OT Short Term Goal 2 (Week 1): Pt will perform LB bathing sit to stand with mod assist.  OT Short Term Goal 3 (Week 1): Pt will complete toilet transfer with mod assist using the RW for support.  OT Short Term Goal 4 (Week 1): Pt will use the RUE for functional use with selfcare tasks and mod facilitation.   OT Short Term Goal 5 (Week 1): Pt will perform UB dressing with mod assist for pullover shirt.  Skilled Therapeutic Interventions/Progress Updates:    Pt in bed to start session, agreed to transfer to the EOB in order to work on eating lunch.  Max assist for rolling to the right side and for transition from sidelying to sitting.  She was able to maintain static sitting balance with close supervision but demonstrates increased head tilt to the right with increased weight shift to the left hip and posterior in sitting.  Min assist needed to correct initially, however she was unable to maintain this position during eating and needed overall mod assist to maintain midline.  She could however maintain her balance with the left lean with min guard assist if she was left alone.  Increased time needed to self feed secondary to decreased sustained attention.  Pt continues to be talkative and doesn't follow commands to hold off on the conversation in order to concentrate on eating.  She did complete task with primarily using the RUE but occasionally using the LUE with min assist for wiping her mouth.  Finished session with pt back in the bed resting, max assist needed to complete sit to stand and step 2 small steps up toward the top of the bed.   Also max facilitation  needed for sitting to supine.  Soft touch call button in reach and bed alarm in place at end of session.   Therapy Documentation Precautions:  Precautions Precautions: Fall Precaution Comments: O2 dependent Restrictions Weight Bearing Restrictions: No  Pain: Pain Assessment Pain Scale: Faces Pain Score: 0-No pain ADL: See Care Tool for some details of ADL  Therapy/Group: Individual Therapy  Lincon Sahlin OTR/L 05/09/2019, 3:54 PM

## 2019-05-09 NOTE — Progress Notes (Signed)
[place] PHYSICAL MEDICINE & REHABILITATION PROGRESS NOTE   Subjective/Complaints: Patient asked how my doing today.  She denies any other new complaints.  She does have pain in her hand relieved by Voltaren gel 3 times daily  ROS: Limited due to cognitive/behavioral   Objective:   No results found. Recent Labs    05/08/19 0716  WBC 8.6  HGB 7.4*  HCT 24.9*  PLT 565*   Recent Labs    05/08/19 0716  NA 139  K 3.9  CL 94*  CO2 32  GLUCOSE 107*  BUN 23  CREATININE 0.74  CALCIUM 8.7*    Intake/Output Summary (Last 24 hours) at 05/09/2019 0841 Last data filed at 05/08/2019 2035 Gross per 24 hour  Intake 480 ml  Output -  Net 480 ml     Physical Exam: Vital Signs Blood pressure (!) 130/56, pulse 68, temperature 98 F (36.7 C), temperature source Oral, resp. rate 18, height 4\' 9"  (1.448 m), weight 61.9 kg, SpO2 99 %.   Constitutional: No distress . Vital signs reviewed. HEENT: EOMI, oral membranes moist Neck: supple Cardiovascular: RRR without murmur. No JVD    Respiratory: CTA Bilaterally without wheezes or rales. Normal effort    GI: BS +, non-tender, non-distended  Extremities: No clubbing, cyanosis, or edema Skin: No evidence of breakdown, no evidence of rash Neurologic: alert, still distracted however, tangential.  Cranial nerves II through XII intact, motor strength is 5/5 in right deltoid, bicep, tricep, grip, hip flexor, knee extensors, ankle dorsiflexor and plantar flexor 2- Left delt bi tri, 3- grip, 2- L hip/knee ext synergy--stable Sensory exam normal sensation to light touch and proprioception in bilateral upper and lower extremities--unchanged  Musculoskeletal: right hand/wrist somewhat sensitive to touch, also with shoulder pain during ROM of RUE Psych:sl anxious  Assessment/Plan: 1. Functional deficits secondary to RIght pontine infarct which require 3+ hours per day of interdisciplinary therapy in a comprehensive inpatient rehab  setting.  Physiatrist is providing close team supervision and 24 hour management of active medical problems listed below.  Physiatrist and rehab team continue to assess barriers to discharge/monitor patient progress toward functional and medical goals  Care Tool:  Bathing    Body parts bathed by patient: Abdomen, Face, Left upper leg, Right upper leg   Body parts bathed by helper: Right arm, Left arm, Chest, Front perineal area, Buttocks, Left lower leg, Right lower leg     Bathing assist Assist Level: Maximal Assistance - Patient 24 - 49%     Upper Body Dressing/Undressing Upper body dressing   What is the patient wearing?: Pull over shirt    Upper body assist Assist Level: Maximal Assistance - Patient 25 - 49%    Lower Body Dressing/Undressing Lower body dressing      What is the patient wearing?: Pants, Incontinence brief     Lower body assist Assist for lower body dressing: Total Assistance - Patient < 25%     Toileting Toileting    Toileting assist Assist for toileting: Total Assistance - Patient < 25%     Transfers Chair/bed transfer  Transfers assist  Chair/bed transfer activity did not occur: (squat pivot/lateral scoot)  Chair/bed transfer assist level: 2 Helpers     Locomotion Ambulation   Ambulation assist   Ambulation activity did not occur: Safety/medical concerns  Assist level: 2 helpers Assistive device: Walker-rolling Max distance: 5 ft   Walk 10 feet activity   Assist  Walk 10 feet activity did not occur: Safety/medical concerns  Walk 50 feet activity   Assist Walk 50 feet with 2 turns activity did not occur: Safety/medical concerns         Walk 150 feet activity   Assist Walk 150 feet activity did not occur: Safety/medical concerns         Walk 10 feet on uneven surface  activity   Assist Walk 10 feet on uneven surfaces activity did not occur: Safety/medical concerns          Wheelchair     Assist Will patient use wheelchair at discharge?: Yes Type of Wheelchair: Manual    Wheelchair assist level: Dependent - Patient 0% Max wheelchair distance: 150 ft    Wheelchair 50 feet with 2 turns activity    Assist        Assist Level: Dependent - Patient 0%   Wheelchair 150 feet activity     Assist     Assist Level: Dependent - Patient 0%    Medical Problem List and Plan: 1.Functional deficits and left hemiparesissecondary to right pontine infarct on 05/01/2019 CIR PT, OT, SLP ongoing, team conference in a.m. 2. Antithrombotics: -DVT/anticoagulation:Pharmaceutical:Lovenox -antiplatelet therapy: ASA/Plavix X 3 weeks followed by ASA. 3.Headaches/Chronic back pain/Pain Management:Voltaren gel for hands, increased to 4 times daily -Gabapentin  100 mg HS. -  tramadol prn with local measuresincluding heat -Lidocain patches/heat/ice.Oxycodone not daily use per records/PMP aware reviewed.     4. Mood:LCSW to follow for evaluation and support. -antipsychotic agents: N/A 5. Neuropsych: This patientis notcapable of making decisions onherown behalf.   -some improvement  -probably underlying cognitive deficits at baseline  -neuropsych to see  -continue to support pt at bedside  6. Skin/Wound Care:LCSW tio follow for evaluation and support. 7. Fluids/Electrolytes/Nutrition:Monitor I/O.  D3 thins.  8. HTN: Monitor BP bid--permissive HTN and normalize in 5-7 days.  9. COPD./chronic hypoxemic respiratory failure: Question maintainence medications. Oxygen dependent.added prn neb -OOB, IS 10. Pre renal azotemia resolved with hydration  -ate a bit better yesterday  -push po  Repeat BUN creatinine normal, GFR normal 11. H/o depression: Continue cymbalta.  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  13. H/o B12 deficiency/Iron  deficiency anemia: Hgb 8.4 at admission with mild leucocytosis.   Added iron supplement.  -most recent hgb 7.4, continue with B12 supplementation We will check fecal occult blood   -B12 supp 14. Dyslipidemia: Now on Lipitor.    LOS: 7 days A FACE TO FACE EVALUATION WAS PERFORMED  Charlett Blake 05/09/2019, 8:41 AM

## 2019-05-10 ENCOUNTER — Inpatient Hospital Stay (HOSPITAL_COMMUNITY): Payer: Medicare Other | Admitting: Occupational Therapy

## 2019-05-10 ENCOUNTER — Inpatient Hospital Stay (HOSPITAL_COMMUNITY): Payer: Medicare Other

## 2019-05-10 ENCOUNTER — Inpatient Hospital Stay (HOSPITAL_COMMUNITY): Payer: Medicare Other | Admitting: Speech Pathology

## 2019-05-10 ENCOUNTER — Inpatient Hospital Stay (HOSPITAL_COMMUNITY): Payer: Self-pay

## 2019-05-10 NOTE — Progress Notes (Addendum)
Social Work Patient ID: [name], female   DOB: February 01, 1936, 83 y.o.   MRN: [id number]   CSW met with pt to update her on team conference discussion and to give her targeted d/c date of 05-24-19.  Pt, although she wants to go home, was realistic that she needs two more weeks on CIR.  CSW explained that pt needs to make a lot more progress and she agreed, but knows her son will help her at home.  CSW tried to call [name], but did not reach him.  CSW will continue to reach out to him.    05-11-19 UPDATE:  CSW talked with pt's son, [name], today and he was aware of targeted d/c date of 05-24-19 and he will come for family education prior to that.  CSW confirmed with him that he is agreeable to hands on care for pt and he is.  CSW will continue to follow and assist as needed.

## 2019-05-10 NOTE — Progress Notes (Signed)
Occupational Therapy Weekly Progress Note  Patient Details  Name: [name] MRN: [id number] Date of Birth: [birth year]  Beginning of progress report period: May 03, 2019 End of progress report period: May 10, 2019  Today's Date: 05/10/2019 OT Individual Time: 4098-1191 OT Individual Time Calculation (min): 78 min    Patient has met 0 of 5 short term goals.  [name] is currently making slow progress with OT at this time.  She continues to need max assist for overall UB and LB bathing as well as for dressing tasks.  She demonstrates Brunnstrum stage IV movement in the left arm and hand, requiring overall mod assist to incorporate into bathing tasks and max assist for dressing tasks.  She also reports increased pain in the RUE with functional use, which she describes as a shooting "electrical type of pain".  She continues to need max assist for stand pivot transfers with overall mod assist to complete sit to stand when pulling up on the [place] or available grab bar.  Selective attention continues to be impaired as pt continues to be very talkative during sessions, requiring max redirection to continue working on the task at hand.  Feel overall that her progress has been slower than expected and that she will likely need greater than min assist level at discharge.  Will continue with comprehensive CIR level therapy and downgrade some goals to appropriate levels with expected discharge of 5/27.     Patient continues to demonstrate the following deficits: muscle weakness, impaired timing and sequencing, unbalanced muscle activation and decreased coordination, decreased attention, decreased awareness, decreased problem solving and decreased memory and decreased sitting balance, decreased standing balance, decreased postural control, hemiplegia and decreased balance strategies and therefore will continue to benefit from skilled OT intervention to enhance overall performance with BADL and Reduce care partner  burden.  Patient not progressing toward long term goals.  See goal revision..  Continue plan of care.  OT Short Term Goals Week 2:  OT Short Term Goal 1 (Week 2): Pt will complete UB bathing with min assist in supported sitting. OT Short Term Goal 2 (Week 2): Pt will perform LB bathing sit to stand with mod assist.  OT Short Term Goal 3 (Week 2): Pt will complete toilet transfer with mod assist using the RW for support.  OT Short Term Goal 4 (Week 2): Pt will use the RUE for functional use with selfcare tasks and mod facilitation.   OT Short Term Goal 5 (Week 2): Pt will perform UB dressing with mod assist for pullover shirt.  Skilled Therapeutic Interventions/Progress Updates:    Pt worked on bathing, dressing, and toileting during session.  [name] was utilized for transfers into the shower and to the toilet during session.  She was able to complete sit to stand using the [facility] with overall mod assist.  She worked on shower first with mod assist for UB bathing this session and max assist for LB bathing sit to stand.  She needed max instructional cueing for sustained attention to task secondary to conversation and perseveration on washing and drying the same body parts over and over because of her distractibility.  She then used the [facility] for transfer to the toilet with overall total assist.  She needed max assist for toilet hygiene as well as LB dressing this session.  She donned a pullover shirt with max assist as well.  Finished session with pt in the wheelchair with call button and phone in reach and NT  in to assist with lunch.    Therapy Documentation Precautions:  Precautions Precautions: Fall Precaution Comments: O2 dependent Restrictions Weight Bearing Restrictions: No  Pain: Pain Assessment Pain Scale: Faces Pain Score: 0-No pain Faces Pain Scale: Hurts a little bit Pain Type: Chronic pain Pain Location: Back Pain Orientation: Lower Pain Descriptors / Indicators: Aching Pain  Frequency: Intermittent Pain Onset: On-going Patients Stated Pain Goal: 3 Pain Intervention(s): Medication (See eMAR) Multiple Pain Sites: No ADL: See Care Tool Section for some details of ADL  Therapy/Group: Individual Therapy  Cillian Gwinner OTR/L 05/10/2019, 4:24 PM

## 2019-05-10 NOTE — Progress Notes (Signed)
[place] PHYSICAL MEDICINE & REHABILITATION PROGRESS NOTE   Subjective/Complaints: Working with SLP, oriented to person place , day Month and year not date  No wheezing , asking about inhalers, used on a prn basis at home  ROS:neg CP, SOB, N/V/D  Objective:   No results found. Recent Labs    05/08/19 0716  WBC 8.6  HGB 7.4*  HCT 24.9*  PLT 565*   Recent Labs    05/08/19 0716  NA 139  K 3.9  CL 94*  CO2 32  GLUCOSE 107*  BUN 23  CREATININE 0.74  CALCIUM 8.7*    Intake/Output Summary (Last 24 hours) at 05/10/2019 0845 Last data filed at 05/10/2019 0803 Gross per 24 hour  Intake 390 ml  Output -  Net 390 ml     Physical Exam: Vital Signs Blood pressure (!) 140/53, pulse 74, temperature 98.5 F (36.9 C), temperature source Oral, resp. rate 20, height '4\' 9"'$  (1.448 m), weight 62.1 kg, SpO2 97 %.   Constitutional: No distress . Vital signs reviewed. HEENT: EOMI, oral membranes moist Neck: supple Cardiovascular: RRR without murmur. No JVD    Respiratory: CTA Bilaterally without wheezes or rales. Normal effort    GI: BS +, non-tender, non-distended  Extremities: No clubbing, cyanosis, or edema Skin: No evidence of breakdown, no evidence of rash Neurologic: alert, still distracted however, tangential.  Cranial nerves II through XII intact, motor strength is 5/5 in right deltoid, bicep, tricep, grip, hip flexor, knee extensors, ankle dorsiflexor and plantar flexor 2- Left delt bi tri, 3- grip, 2- L hip/knee ext synergy--stable Sensory exam normal sensation to light touch and proprioception in bilateral upper and lower extremities--unchanged  Musculoskeletal: right hand/wrist somewhat sensitive to touch, also with shoulder pain during ROM of RUE Psych:sl anxious  Assessment/Plan: 1. Functional deficits secondary to RIght pontine infarct which require 3+ hours per day of interdisciplinary therapy in a comprehensive inpatient rehab setting.  Physiatrist is  providing close team supervision and 24 hour management of active medical problems listed below.  Physiatrist and rehab team continue to assess barriers to discharge/monitor patient progress toward functional and medical goals  Care Tool:  Bathing    Body parts bathed by patient: Abdomen, Face, Left upper leg, Right upper leg   Body parts bathed by helper: Right arm, Left arm, Chest, Front perineal area, Buttocks, Left lower leg, Right lower leg     Bathing assist Assist Level: Maximal Assistance - Patient 24 - 49%     Upper Body Dressing/Undressing Upper body dressing   What is the patient wearing?: Pull over shirt    Upper body assist Assist Level: Maximal Assistance - Patient 25 - 49%    Lower Body Dressing/Undressing Lower body dressing      What is the patient wearing?: Pants, Incontinence brief     Lower body assist Assist for lower body dressing: Total Assistance - Patient < 25%     Toileting Toileting    Toileting assist Assist for toileting: Total Assistance - Patient < 25%     Transfers Chair/bed transfer  Transfers assist  Chair/bed transfer activity did not occur: (squat pivot/lateral scoot)  Chair/bed transfer assist level: Maximal Assistance - Patient 25 - 49%     Locomotion Ambulation   Ambulation assist   Ambulation activity did not occur: Safety/medical concerns  Assist level: 2 helpers Assistive device: Walker-rolling Max distance: 5 ft   Walk 10 feet activity   Assist  Walk 10 feet activity did not occur:  Safety/medical concerns        Walk 50 feet activity   Assist Walk 50 feet with 2 turns activity did not occur: Safety/medical concerns         Walk 150 feet activity   Assist Walk 150 feet activity did not occur: Safety/medical concerns         Walk 10 feet on uneven surface  activity   Assist Walk 10 feet on uneven surfaces activity did not occur: Safety/medical concerns          Wheelchair     Assist Will patient use wheelchair at discharge?: Yes Type of Wheelchair: Manual    Wheelchair assist level: Dependent - Patient 0% Max wheelchair distance: 150 ft    Wheelchair 50 feet with 2 turns activity    Assist        Assist Level: Dependent - Patient 0%   Wheelchair 150 feet activity     Assist     Assist Level: Dependent - Patient 0%    Medical Problem List and Plan: 1.Functional deficits and left hemiparesissecondary to right pontine infarct on 05/01/2019 CIR PT, OT, SLP, Team conference today please see physician documentation under team conference tab, met with team face-to-face to discuss problems,progress, and goals. Formulized individual treatment plan based on medical history, underlying problem and comorbidities. 2. Antithrombotics: -DVT/anticoagulation:Pharmaceutical:Lovenox -antiplatelet therapy: ASA/Plavix X 3 weeks followed by ASA. 3.Headaches/Chronic back pain/Pain Management:Voltaren gel for hands, increased to 4 times daily -Gabapentin  100 mg HS. -  tramadol prn with local measuresincluding heat -Lidocain patches/heat/ice.Oxycodone not daily use per records/PMP aware reviewed.     4. Mood:LCSW to follow for evaluation and support. -antipsychotic agents: N/A 5. Neuropsych: This patientis notcapable of making decisions onherown behalf.   -some improvement  -probably underlying cognitive deficits at baseline  -neuropsych to see  -continue to support pt at bedside  6. Skin/Wound Care:LCSW tio follow for evaluation and support. 7. Fluids/Electrolytes/Nutrition:Monitor I/O.  D3 thins.  8. HTN: Monitor BP bid--permissive HTN and normalize in 5-7 days.  9. COPD./chronic hypoxemic respiratory failure: Question maintainence medications. Oxygen dependent.added prn neb -OOB, IS 10. Pre renal azotemia resolved with  hydration  I: 475m  Repeat BUN creatinine normal, GFR normal 11. H/o depression: Continue cymbalta.  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  13. H/o B12 deficiency/Iron deficiency anemia: Hgb 8.4 at admission with mild leucocytosis.   Added iron supplement.  -most recent hgb 7.4, continue with B12 supplementation We will check fecal occult blood   -B12 supp 14. Dyslipidemia: Now on Lipitor.    LOS: 8 days A FACE TO FACE EVALUATION WAS PERFORMED  ACharlett Blake5/13/2020, 8:45 AM

## 2019-05-10 NOTE — Progress Notes (Signed)
Physical Therapy Weekly Progress Note  Patient Details  Name: [name] MRN: [id number] Date of Birth: Jan 25, 1936  Beginning of progress report period: May 03, 2019 End of progress report period: May 10, 2019  Today's Date: 05/10/2019 PT Individual Time: 0901-1000 PT Individual Time Calculation (min): 59 min   Patient has met 2 of 3 short term goals.  Pt is progressing towards long term goals, she continues to require mod assist for transfers and bed mobility, max assist for gait. Pt fatigues very quickly and has poor standing tolerance, limiting her ability to work on gait training.   Patient continues to demonstrate the following deficits muscle weakness, decreased coordination and decreased motor planning, decreased attention and decreased awareness and decreased standing balance and decreased balance strategies and therefore will continue to benefit from skilled PT intervention to increase functional independence with mobility.  Patient progressing toward long term goals..  Continue plan of care.  PT Short Term Goals Week 1:  PT Short Term Goal 1 (Week 1): Pt will perform bed mobility with mod assist PT Short Term Goal 1 - Progress (Week 1): Met PT Short Term Goal 2 (Week 1): Pt will perform bed<>chair transfer with mod assist+1 PT Short Term Goal 2 - Progress (Week 1): Progressing toward goal PT Short Term Goal 3 (Week 1): Pt will iniate gait training with LRAD PT Short Term Goal 3 - Progress (Week 1): Met Week 2:  PT Short Term Goal 1 (Week 2): Pt will ambulated x 10 ft with mod assist and RW PT Short Term Goal 2 (Week 2): Pt will perform bed mobility bed mobility  PT Short Term Goal 3 (Week 2): Pt will perform car transfer with mod assist  Skilled Therapeutic Interventions/Progress Updates:    Pt supine in bed upon PT arrival, agreeable to therapy tx and denies pain. Pt transferred to sitting EOB with mod assist and performed stand pivot this session to w/c with RW and mod  assist. Pt transported to the gym. Pt performed stand pivot to the mat with mod assist and RW, cues for techniques and assist to help move L LE. Pt performed sit<>stands throughout this session from edge of mat with RW and mod assist, cues for upright posture and anterior weightshift. Pt standing with RW worked on stepping in place with L LE forwards/backwards x 4 and then sideways steps x 4, mod assist. Therapist performed L LE hamstring and heelcord stretching this session 2 x 30 sec, pt with noted tightness and therapist recommending use of PRAFO at night. Pt performed x 4 sit<>stands from the mat in a row for LE strengthening, cues for techniques, using RW and min-mod assist. Pt performed standing balance for time, able to maintain static standing x65 seconds with min assist working on standing tolerance. Pt worked on standing balance with single UE support on RW while giving therapist high fives with R UE, x 5 with min assist for balance. Stand pivot to w/c with RW and mod assist. Pt transported back to room and left seated in w/c with needs in reach and chair alarm set.   Therapy Documentation Precautions:  Precautions Precautions: Fall Precaution Comments: O2 dependent Restrictions Weight Bearing Restrictions: No   Therapy/Group: Individual Therapy  Netta Corrigan, PT, DPT 05/10/2019, 7:53 AM

## 2019-05-10 NOTE — Patient Care Conference (Signed)
Inpatient RehabilitationTeam Conference and Plan of Care Update Date: 05/10/2019   Time: 11:05 AM    Patient Name: [name]      Medical Record Number: [id number]  Date of Birth: December 17, 1936 Sex: Female         Room/Bed: 4W10C/4W10C-01 Payor Info: Payor: [employer] MEDICARE / Plan: [employer] MEDICARE / Product Type: *No Product type* /    Admitting Diagnosis: Rt CVA; 0106A; 22-24days  Admit Date/Time:  05/02/2019  5:41 PM Admission Comments: No comment available   Primary Diagnosis:  <principal problem not specified> Principal Problem: <principal problem not specified>  Patient Active Problem List   Diagnosis Date Noted  . Right pontine stroke ([place]) 05/02/2019  . Stroke ([place]) 05/01/2019  . Acute renal failure superimposed on stage 3 chronic kidney disease ([place]) 05/01/2019  . Acute ischemic stroke ([place]) 05/01/2019  . AKI (acute kidney injury) ([place]) 08/29/2018  . Encephalopathy acute 08/22/2018  . Chronic back pain 08/22/2018  . Vitamin B12 deficiency 08/19/2018  . Dehydration   . Tachycardia   . Altered mental state 08/18/2018  . Closed compression fracture of L1 vertebra ([facility])   . Acute cystitis without hematuria   . Pain management   . Intractable pain   . Back pain 10/10/2016  . L2 vertebral fracture ([place]) 10/10/2016  . Acute encephalopathy 12/25/2015  . Drug-induced delirium ([place]) 12/25/2015  . Acute on chronic respiratory failure ([place]) 12/25/2015  . CAP (community acquired pneumonia) 12/19/2015  . Chronic respiratory failure ([place]) 03/19/2014  . Depression 03/19/2014  . Aspiration pneumonia ([place]) 03/10/2014  . Acute respiratory failure with hypoxia ([place]) 03/10/2014  . Sepsis ([place]) 03/10/2014  . Hypokalemia 03/10/2014  . Hypertension 03/10/2014  . COPD (chronic obstructive pulmonary disease) ([place]) 03/10/2014  . Tobacco abuse 03/10/2014  . Normocytic anemia 06/15/2013  . Generalized abdominal pain 06/15/2013  . Blood in the stool 06/15/2013    Expected  Discharge Date: Expected Discharge Date: 05/24/19  Team Members Present: Physician leading conference: Dr. Alysia Penna Social Worker Present: Alfonse Alpers, LCSW Nurse Present: Rosita Fire, LPN PT Present: Michaelene Song, PT OT Present: Clyda Greener, OT SLP Present: Other (comment)(Celia Quentin Ore, SLP) PPS Coordinator present : Gunnar Fusi     Current Status/Progress Goal Weekly Team Focus  Medical   No longer paranoid, incont bowel , hgb dropped to 7.4, checking stool OB, Left ankel contracture  improve ankle ROM, improve endurance  Cont rehab   Bowel/Bladder   Pt is incontinent of B/B. LBM 05/08/2019.  Maintain regular bowel pattern. Encourage timed toileting.  Assist with toileting needs Q shift and PRN.   Swallow/Nutrition/ Hydration   dys 3 and thin, Supervision-Min A  Mod I   carry over of swallow strategies and trials of advanced diet    ADL's   max assist for UB and LB selfcare supine to sitting.  Max assist for all dressing with total +2 for toileting (pt 25%) using the [place]  min assist overall, will likely need downgrading  selfcare retraining, balance retraining, transfer training, DME education, pt/family education   Mobility   mod assist bed mobility, max assist stand pivot, max assist+2 gait RW x40ft, mod assist sit<>stand  min assist for transfers and gait 20 ft  sitting balance, standing tolerance, gait, transfers, neuro re-ed, cognition, awareness   Communication             Safety/Cognition/ Behavioral Observations  Min-Mod A  Min- Supervision A   basic problem solving, recall, intellectual awareness and sustained attention  Pain   No complaints of pain.  Remain pain free.  Assess pain Q shift and PRN.   Skin   Pt has stage 2 medical device-related pressure injury to R ear.  Treat skin issues per orders.  Assess skin Q shift and PRN.     Rehab Goals Patient on target to meet rehab goals: Yes Rehab Goals Revised: goals will likely need to be downgraded  to mod A *See Care Plan and progress notes for long and short-term goals.     Barriers to Discharge  Current Status/Progress Possible Resolutions Date Resolved   Physician    Medical stability     slow progress, baseline cognitive deficits  work on incentive [place] home environment;Decreased caregiver support;Home environment access/layout;Incontinence;Lack of/limited family support;Medication compliance;Nutrition means               PT                    OT                  SLP                SW                Discharge Planning/Teaching Needs:  Pt to return to her home where her son will assist her.  CSW will check with him to see if he can provide mod A, in case pt cannot reach min A.  Son to come in for family education closer to d/c.   Team Discussion:  Pt is doing better cognitively and SLP is wondering if pt is close to her baseline.  Pt with chronic neck/back pain and is using voltaren gel.   MD has not seen LUE improvement and pt is still anemic.  He will continue to watch her hemoglobin and B12 levels and will check stool sample for any blood.  Pt is incontinent of both bladder and bowel, with last BM on 05-09-19.  Pt's IV to stay for now and pt to stay on O2 at 2L.  Pt is max A for bathing and dressing and she c/o of shooting (electrical) pain in R arm.  Pt is mod A for functional tasks and transfers are max A.  Pt is internally distracted and rambles during most of session.  OT questions if pt will reach min A or if she will need mod A or more at d/c.  Pt has made a little progress with PT with sit to stands.  Pt cannot fully stand up with transfers and gait.  Her heel cord is tight and order requested for PRAFO.  Pt is max A for transfers and gait of 5' with rolling walker; bed mobility is mod A.  Pt is on D3 diet with thin liquids.  She has difficulty with recall, basic problem solving, awareness.  SLP initiated memory notebook.  Pt needed max cues  for how to use incentive spirometer.    Revisions to Treatment Plan:  none    Continued Need for Acute Rehabilitation Level of Care: The patient requires daily medical management by a physician with specialized training in physical medicine and rehabilitation for the following conditions: Daily direction of a multidisciplinary physical rehabilitation program to ensure safe treatment while eliciting the highest outcome that is of practical value to the patient.: Yes Daily medical management of patient stability for increased activity during participation in an intensive rehabilitation  regime.: Yes Daily analysis of laboratory values and/or radiology reports with any subsequent need for medication adjustment of medical intervention for : Neurological problems   I attest that I was present, lead the team conference, and concur with the assessment and plan of the team.Team conference was held via web/ teleconference due to [place] - 19.  Destyn Schuyler, Silvestre Mesi 05/10/2019, 1:30 PM

## 2019-05-10 NOTE — Progress Notes (Signed)
Speech Language Pathology Weekly Progress and Session Note  Patient Details  Name: GREY SCHLAUCH MRN: [id number] Date of Birth: [birth year]  Beginning of progress report period: May 03, 2019 End of progress report period: May 10, 2019  Today's Date: 05/10/2019 SLP Individual Time: 0800-0900 SLP Individual Time Calculation (min): 60 min  Short Term Goals: Week 1: SLP Short Term Goal 1 (Week 1): Patient will demonstrate intellectual awareness and identify 2 physical and 2 cognitive deficits with Mod A multimodal cues.  SLP Short Term Goal 1 - Progress (Week 1): Met SLP Short Term Goal 2 (Week 1): Patient will demonstrate functional problem solving for basic tasks with Mod A multimodal cues.  SLP Short Term Goal 2 - Progress (Week 1): Not met SLP Short Term Goal 3 (Week 1): Patient will demonstrate sustained attention to functional tasks for 10 minutes with Mod A verbal cues for redirection.  SLP Short Term Goal 3 - Progress (Week 1): Met SLP Short Term Goal 4 (Week 1): Pt will consume least restrictive diet with minimal overt s/s of aspiration to reduce risk of choking.  SLP Short Term Goal 4 - Progress (Week 1): Met    New Short Term Goals: Week 2: SLP Short Term Goal 1 (Week 2): Patient will demonstrate intellectual awareness and identify 2 physical and 2 cognitive deficits with Min A multimodal cues.  SLP Short Term Goal 2 (Week 2): Patient will demonstrate functional problem solving for basic tasks with Mod A multimodal cues.  SLP Short Term Goal 3 (Week 2): Patient will demonstrate sustained attention to functional tasks for 10 minutes with Min A verbal cues for redirection.  SLP Short Term Goal 4 (Week 2): Pt will utilize compensatory memory aids to recall new and/or daily information with ModA verbal and questions cues  Weekly Progress Updates: Pt has shown good progress toward weekly goals, meeting 3/4 goals. Pt continues to struggle with functional problem solving, recall, and  awareness of deficits. Goals have been updated to reflect progress and areas of continued need.   Intensity: Minumum of 1-2 x/day, 30 to 90 minutes Frequency: 3 to 5 out of 7 days Duration/Length of Stay: 5/27 Treatment/Interventions: Cognitive remediation/compensation;Environmental controls;Internal/external aids;Speech/Language facilitation;Therapeutic Activities;Patient/family education;Functional tasks;Cueing hierarchy   Daily Session Skilled Therapeutic Interventions: Pt was seen for skilled ST intervention targeting goals for improved cognition. SLP facilitated session by engaging pt in conversation regarding current focus of therapy. This information was written in memory notebook for compensatory strategy to facilitate functional recall of new and daily information. Mod A multimodal cues were provided to assist pt in verbalizing physical and cognitive deficits as related to therapy focus. Pt was introduced to the incentive spirometer per MD request. Pt required max verbal and visual cues with repeated instruction for follow through on breathing exercise. Pt was encouraged to perform 10 deep breaths (min 750) each hour. Staff was also encouraged to assist with this activity for improved carryover and benefit. Continued ST intervention is recommended to maximize cognitive function and safety, and to minimize caregiver burden.   General    Pain Pain Assessment Pain Scale: 0-10 Pain Score: 7  Pain Type: Chronic pain Pain Location: Arm Pain Orientation: Right Pain Descriptors / Indicators: Aching Pain Onset: On-going Patients Stated Pain Goal: 4 Pain Intervention(s): Repositioned;Distraction Multiple Pain Sites: No  Therapy/Group: Individual Therapy   Clyde Zarrella B. Quentin Ore, [facility], [place] Speech Language Pathologist  Shonna Chock 05/10/2019, 1:52 PM

## 2019-05-11 ENCOUNTER — Inpatient Hospital Stay (HOSPITAL_COMMUNITY): Payer: Medicare Other | Admitting: Occupational Therapy

## 2019-05-11 ENCOUNTER — Inpatient Hospital Stay (HOSPITAL_COMMUNITY): Payer: Medicare Other | Admitting: Speech Pathology

## 2019-05-11 ENCOUNTER — Inpatient Hospital Stay (HOSPITAL_COMMUNITY): Payer: Medicare Other | Admitting: Physical Therapy

## 2019-05-11 ENCOUNTER — Inpatient Hospital Stay (HOSPITAL_COMMUNITY): Payer: Medicare Other

## 2019-05-11 LAB — TROPONIN I: Troponin I: <0.03 ng/mL (ref <0.03)

## 2019-05-11 LAB — HEMOGLOBIN AND HEMATOCRIT, BLOOD
HCT: 22.1 % — ABNORMAL LOW (ref 36.0–46.0)
HCT: 26.4 % — ABNORMAL LOW (ref 36.0–46.0)
Hemoglobin: 6.7 g/dL — CL (ref 12.0–15.0)
Hemoglobin: 8.1 g/dL — ABNORMAL LOW (ref 12.0–15.0)

## 2019-05-11 LAB — PREPARE RBC (CROSSMATCH)

## 2019-05-11 LAB — ABO/RH: ABO/RH(D): O POS

## 2019-05-11 MED ORDER — SODIUM CHLORIDE 0.9% IV SOLUTION: Freq: Once | INTRAVENOUS | Status: AC

## 2019-05-11 MED ORDER — SODIUM CHLORIDE 0.9% IV SOLUTION
Freq: Once | INTRAVENOUS | Status: AC
  Administered 2019-05-11: 15:00:00 via INTRAVENOUS

## 2019-05-11 NOTE — Progress Notes (Signed)
[place] PHYSICAL MEDICINE & REHABILITATION PROGRESS NOTE   Subjective/Complaints: No issues overnite except feels fatigued during therapy    ROS:neg CP, SOB, N/V/D  Objective:   No results found. Recent Labs    05/11/19 0452  HGB 6.7*  HCT 22.1*   No results for input(s): NA, K, CL, CO2, GLUCOSE, BUN, CREATININE, CALCIUM in the last 72 hours.  Intake/Output Summary (Last 24 hours) at 05/11/2019 0827 Last data filed at 05/10/2019 1833 Gross per 24 hour  Intake 300 ml  Output -  Net 300 ml     Physical Exam: Vital Signs Blood pressure (!) 123/58, pulse 93, temperature 98.2 F (36.8 C), temperature source Oral, resp. rate 17, height 4\' 9"  (1.448 m), weight 62.1 kg, SpO2 96 %.   Constitutional: No distress . Vital signs reviewed. HEENT: EOMI, oral membranes moist Neck: supple Cardiovascular: RRR without murmur. No JVD    Respiratory: CTA Bilaterally without wheezes or rales. Normal effort    GI: BS +, non-tender, non-distended  Extremities: No clubbing, cyanosis, or edema Skin: No evidence of breakdown, no evidence of rash Neurologic: alert, still distracted however, tangential.  Cranial nerves II through XII intact, motor strength is 5/5 in right deltoid, bicep, tricep, grip, hip flexor, knee extensors, ankle dorsiflexor and plantar flexor 2- Left delt bi tri, 3- grip, 2- L hip/knee ext synergy--stable Sensory exam normal sensation to light touch and proprioception in bilateral upper and lower extremities--unchanged  Musculoskeletal: right hand/wrist somewhat sensitive to touch, also with shoulder pain during ROM of RUE Psych:sl anxious  Assessment/Plan: 1. Functional deficits secondary to RIght pontine infarct which require 3+ hours per day of interdisciplinary therapy in a comprehensive inpatient rehab setting.  Physiatrist is providing close team supervision and 24 hour management of active medical problems listed below.  Physiatrist and rehab team continue to  assess barriers to discharge/monitor patient progress toward functional and medical goals  Care Tool:  Bathing    Body parts bathed by patient: Chest, Abdomen, Right upper leg, Left upper leg, Face   Body parts bathed by helper: Right arm, Left arm, Front perineal area, Buttocks, Left lower leg, Right lower leg     Bathing assist Assist Level: Maximal Assistance - Patient 24 - 49%     Upper Body Dressing/Undressing Upper body dressing   What is the patient wearing?: Pull over shirt    Upper body assist Assist Level: Maximal Assistance - Patient 25 - 49%    Lower Body Dressing/Undressing Lower body dressing      What is the patient wearing?: Pants, Incontinence brief     Lower body assist Assist for lower body dressing: Maximal Assistance - Patient 25 - 49%     Toileting Toileting    Toileting assist Assist for toileting: Total Assistance - Patient < 25%     Transfers Chair/bed transfer  Transfers assist  Chair/bed transfer activity did not occur: (squat pivot/lateral scoot)  Chair/bed transfer assist level: Moderate Assistance - Patient 50 - 74%(with RW)     Locomotion Ambulation   Ambulation assist   Ambulation activity did not occur: Safety/medical concerns  Assist level: 2 helpers Assistive device: Walker-rolling Max distance: 5 ft   Walk 10 feet activity   Assist  Walk 10 feet activity did not occur: Safety/medical concerns        Walk 50 feet activity   Assist Walk 50 feet with 2 turns activity did not occur: Safety/medical concerns         Walk 150  feet activity   Assist Walk 150 feet activity did not occur: Safety/medical concerns         Walk 10 feet on uneven surface  activity   Assist Walk 10 feet on uneven surfaces activity did not occur: Safety/medical concerns         Wheelchair     Assist Will patient use wheelchair at discharge?: Yes Type of Wheelchair: Manual    Wheelchair assist level: Dependent -  Patient 0% Max wheelchair distance: 150 ft    Wheelchair 50 feet with 2 turns activity    Assist        Assist Level: Dependent - Patient 0%   Wheelchair 150 feet activity     Assist     Assist Level: Dependent - Patient 0%    Medical Problem List and Plan: 1.Functional deficits and left hemiparesissecondary to right pontine infarct on 05/01/2019 CIR PT, OT, SLP, cont rehab 2. Antithrombotics: -DVT/anticoagulation:Pharmaceutical:Lovenox -antiplatelet therapy: ASA/Plavix X 3 weeks followed by ASA. 3.Headaches/Chronic back pain/Pain Management:Voltaren gel for hands, increased to 4 times daily -Gabapentin  100 mg HS. -  tramadol prn with local measuresincluding heat -Lidocain patches/heat/ice.Oxycodone not daily use per records/PMP aware reviewed.     4. Mood:LCSW to follow for evaluation and support. -antipsychotic agents: N/A 5. Neuropsych: This patientis notcapable of making decisions onherown behalf.   -some improvement  -probably underlying cognitive deficits at baseline  -neuropsych to see  -continue to support pt at bedside  6. Skin/Wound Care:LCSW tio follow for evaluation and support. 7. Fluids/Electrolytes/Nutrition:Monitor I/O.  D3 thins.  8. HTN: Monitor BP bid--permissive HTN and normalize in 5-7 days.  9. COPD./chronic hypoxemic respiratory failure: Question maintainence medications. Oxygen dependent.added prn neb -OOB, IS 10. Pre renal azotemia resolved with hydration  I: 476ml  Repeat BUN creatinine normal, GFR normal 11. H/o depression: Continue cymbalta.  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  13. H/o B12 deficiency/Iron deficiency anemia: Hgb 8.4 at admission with mild leucocytosis.   Added iron supplement.  -most recent hgb 7.4, continue with B12 supplementation We will check fecal occult blood Will transfuse  today 1 U   -B12 supp 14. Dyslipidemia: Now on Lipitor.    LOS: 9 days A FACE TO FACE EVALUATION WAS PERFORMED  Charlett Blake 05/11/2019, 8:27 AM

## 2019-05-11 NOTE — Progress Notes (Signed)
Occupational Therapy Session Note  Patient Details  Name: [name] MRN: [id number] Date of Birth: 1936-04-13  Today's Date: 05/11/2019 OT Individual Time: 2395-3202 OT Individual Time Calculation (min): 53 min    Short Term Goals: Week 2:  OT Short Term Goal 1 (Week 2): Pt will complete UB bathing with min assist in supported sitting. OT Short Term Goal 2 (Week 2): Pt will perform LB bathing sit to stand with mod assist.  OT Short Term Goal 3 (Week 2): Pt will complete toilet transfer with mod assist using the RW for support.  OT Short Term Goal 4 (Week 2): Pt will use the RUE for functional use with selfcare tasks and mod facilitation.   OT Short Term Goal 5 (Week 2): Pt will perform UB dressing with mod assist for pullover shirt.  Skilled Therapeutic Interventions/Progress Updates:    Pt transferred from supine to EOB with max assist to start session.  Pt limited to bed therapy or EOB as she is currently receiving a transfusion.  She worked on washing her face with setup of the washcloth and min guard assist for sitting balance.  Mod instructional cueing for maintaining midline as she tilts her head to the right and leans to the right as well.  She was able to drink from her cup with a straw, using the LUE and mod assist.  She sat EOB for approximately 40 mins, working on self feeding and sitting balance at EOB.  Finished session with transfer back to the bed with max assist for standing in order to take 2 small steps up toward the [facility] and max assist to transition to supine from sitting.  Pt left with nursing present and call button and phone in reach.    Therapy Documentation Precautions:  Precautions Precautions: Fall Precaution Comments: O2 dependent Restrictions Weight Bearing Restrictions: No  Pain: Pain Assessment Pain Scale: Faces Pain Score: 0-No pain ADL: See Care Tool Section for some details of ADL  Therapy/Group: Individual Therapy  Geffrey Michaelsen  OTR/L 05/11/2019, 4:02 PM

## 2019-05-11 NOTE — Progress Notes (Signed)
Patient c/o pain across her left breast area but points on her left side. Claims it is not heavy. Patient had pain med earlier. Continued to monitor.

## 2019-05-11 NOTE — Progress Notes (Signed)
Occupational Therapy Session Note  Patient Details  Name: [name] MRN: [id number] Date of Birth: Nov 25, 1936  Today's Date: 05/11/2019 OT Individual Time: 1140-1206 OT Individual Time Calculation (min): 26 min    Skilled Therapeutic Interventions/Progress Updates: Patient limited to work bed level due to very low hemoglobin and other blood element levels.    Before stating she was hungry and asked to rest, she concurred to participate as follows:  Oral care and left upper extremity stablizer use:    Required 1 prompt to sequence initial steps of the task.    For oral care she required Min A and moderate verbal cues do to fatigued and decreased sequencing skills.   She required maximal hand over hand assist to use left hand as gross stablizer to hold and slightly squeeze toothpaste tube.  She did exhibit trace gross hand and digital muscle strength when holding and squeezing out the paste.   Other wise, she was assisted with bed positioning, as well as left hand positioning.      At the end of the session, she was left with call bell and phone in place and nursing completing and providing care with blood transfusion.     Therapy Documentation Precautions:  Precautions Precautions: Fall Precaution Comments: O2 dependent Restrictions Weight Bearing Restrictions: No  General OT Amount of Missed Time: 4 Minutes(4)  Pain:denied   Therapy/Group: Individual Therapy  Alfredia Ferguson [facility] 05/11/2019, 2:51 PM

## 2019-05-11 NOTE — Progress Notes (Signed)
Physical Therapy Session Note  Patient Details  Name: [name] MRN: [id number] Date of Birth: 11/04/1936  Today's Date: 05/11/2019 PT Individual Time: 0903-1003 PT Individual Time Calculation (min): 60 min   Short Term Goals: Week 2:  PT Short Term Goal 1 (Week 2): Pt will ambulated x 10 ft with mod assist and RW PT Short Term Goal 2 (Week 2): Pt will perform bed mobility bed mobility  PT Short Term Goal 3 (Week 2): Pt will perform car transfer with mod assist  Skilled Therapeutic Interventions/Progress Updates:    Pt's hemoglobin noted to be 6.7 and therapist spoke with Dr Letta Pate and he approved bed level and edge of bed activity with therapy. Pt received supine in bed and agreeable to therapy session despite reporting she has been very tired. Pt wearing 2L of O2 via nasal cannula for entire session.  Pt performed the following supine exercises in BLEs x10 repetitions each:  - heel slides requiring AAROM on L LE - short arch quads with cuing and manual facilitation for full extension - bridges with manual facilitation for increased weightbearing and hip extension in L LE with pt demonstrating minimal lifting from the bed - supine hip abduction in hooklying position against manual resistance with cuing for increased L LE activation  Performed supine to sit HOB partially elevated and using bedrails with mod assist for  BLE management and trunk upright - cuing for sequencing and increased use of L UE to assist with trunk upright. Pt sat EOB for ~47minutes focusing on activity tolerance, sitting balance, and L UE NMR. SpO2 monitored via pulse oximeter and 90%-94% while sitting on EOB with 2L of O2 via nasal cannula due to pt with labored breathing. Performed L UE NMR elbow flexion/extension with assist for increased ROM. Performed repeated L lateral lean on forearm with min assist for trunk upright and cuing for L LE elbow extension to promote muscle activation and weightbearing.  Performed sit to supine with max assist for trunk descent and B LE management. Supine scooting with max assist and bed in trendelenburg. Pt left supine in bed with needs in reach, lines intact, and bed alarm on.  Therapy Documentation Precautions:  Precautions Precautions: Fall Precaution Comments: O2 dependent Restrictions Weight Bearing Restrictions: No  Pain: No reports of pain during session.   Therapy/Group: Individual Therapy  Tawana Scale, PT, DPT 05/11/2019, 7:52 AM

## 2019-05-11 NOTE — Progress Notes (Signed)
Speech Language Pathology Daily Session Note  Patient Details  Name: LUBNA STEGEMAN MRN: [id number] Date of Birth: [birth year]  Today's Date: 05/11/2019 SLP Individual Time: 1300-1330 SLP Individual Time Calculation (min): 30 min  Short Term Goals: Week 2: SLP Short Term Goal 1 (Week 2): Patient will demonstrate intellectual awareness and identify 2 physical and 2 cognitive deficits with Min A multimodal cues.  SLP Short Term Goal 2 (Week 2): Patient will demonstrate functional problem solving for basic tasks with Mod A multimodal cues.  SLP Short Term Goal 3 (Week 2): Patient will demonstrate sustained attention to functional tasks for 10 minutes with Min A verbal cues for redirection.  SLP Short Term Goal 4 (Week 2): Pt will utilize compensatory memory aids to recall new and/or daily information with ModA verbal and questions cues  Skilled Therapeutic Interventions:  Skilled treatment session focused on cognition goals. SLP received pt in bed receiving blood transfusion. Pt appeared somewhat reduced overall as evidenced by decreased talking and overall statement that she "just didn't feel great." With supervision questions, pt able to recall previous therapy session from morning as well as rationale behind receiving transfusion. Pt able to request urinal but SLP later in finding and providing. SLP facilitated bed mobility for peri-care d/t inability to hold urine while SLP retrieved urinal. Pt left upright in bed, bed alarm on and all needs within reach. Continue per current plan of care.      Pain    Therapy/Group: Individual Therapy  Danielle Lento 05/11/2019, 3:28 PM

## 2019-05-11 NOTE — Progress Notes (Signed)
Pt c/o pain to the upper left chest across the breast and left flank area. Pt reports baseline chest heaviness COPD related. EKG ordered by Danella Sensing, NP. RN will continue to monitor.

## 2019-05-11 NOTE — Progress Notes (Signed)
On discharge Kirvin MEDS CAREFULLY. We were unable to confirm what she was taking prior to admission. The medications on her PTA list represent medications she MAY HAVE at home so that she or caregivers can be advised about them upon discharge.   Thank you,  Romeo Rabon, PharmD. Mobile: [phone]. 05/11/2019,9:07 AM.

## 2019-05-12 ENCOUNTER — Inpatient Hospital Stay (HOSPITAL_COMMUNITY): Payer: Medicare Other

## 2019-05-12 ENCOUNTER — Inpatient Hospital Stay (HOSPITAL_COMMUNITY): Payer: Medicare Other | Admitting: Physical Therapy

## 2019-05-12 ENCOUNTER — Inpatient Hospital Stay (HOSPITAL_COMMUNITY): Payer: Medicare Other | Admitting: Occupational Therapy

## 2019-05-12 LAB — TYPE AND SCREEN
ABO/RH(D): O POS
Antibody Screen: NEGATIVE
Unit division: 0

## 2019-05-12 LAB — BPAM RBC
Blood Product Expiration Date: 202006022359
ISSUE DATE / TIME: 202005141131
Unit Type and Rh: 5100

## 2019-05-12 LAB — TROPONIN I
Troponin I: <0.03 ng/mL (ref <0.03)
Troponin I: <0.03 ng/mL (ref <0.03)

## 2019-05-12 MED ORDER — MUSCLE RUB 10-15 % EX CREA
TOPICAL_CREAM | Freq: Two times a day (BID) | CUTANEOUS | Status: DC | PRN
  Administered 2019-05-12 – 2019-05-23 (×3): via TOPICAL
  Filled 2019-05-12: qty 85

## 2019-05-12 NOTE — Progress Notes (Signed)
Physical Therapy Session Note  Patient Details  Name: [name] MRN: [id number] Date of Birth: 1936-12-10  Today's Date: 05/12/2019 PT Individual Time: 5809-9833 PT Individual Time Calculation (min): 63 min   Short Term Goals: Week 2:  PT Short Term Goal 1 (Week 2): Pt will ambulated x 10 ft with mod assist and RW PT Short Term Goal 2 (Week 2): Pt will perform bed mobility bed mobility  PT Short Term Goal 3 (Week 2): Pt will perform car transfer with mod assist  Skilled Therapeutic Interventions/Progress Updates:   Pt received supine in bed and agreeable to therapy session. Pt wearing 2L of O2 via nasal cannula throughout session. Pt donned pants in supine with mod/max assist for rolling R/L and max assist for clothing management. Performed supine to sit, HOB partially elevated and using bedrails, with mod/max assist for B LE management and trunk upright. Performed seated L LE long arch quads x10 repetitions with cuing for increased ROM. Performed L squat pivot transfer EOB>w/c with mod assist for lifting/pivoting hips and for balance. Pt reporting she needs to pee and requests to use urinal. Performed sit>stand w/c>RW with mod assist for lifting and pt able to maintain standing balance with B UE support on RW and intermittent CGA for steadying while therapist performed LB clothing management and positioned urinal max assist. Pt unable to urinate and reports she also needs to have BM. Transported to bathroom in w/c performed L squat pivot transfer w/c>BSC over toilet with max assist. Performed sit>stand BSC over toilet>RW with mod assist for lifting and max assist LB clothing management while standing with B UE support on RW and intermittent CGA. Pt continent of bladder on toilet and reports constipation with bowels - RN notified and therapist provided B LE foot support to improve pelvic alignment for BM. Performed sit>stand BSC over toilet>RW with mod assist for lifting and min assist for balance  with B UE support on RW while therapist performed LB clothing management and peri-care max assist.  Stand pivot transfer toilet>w/c using RW with min/mod assist for balance. Transported to sink in w/c. Performed sit<>stand w/c<>BUE support on sink with min assist for lifting and min assist for balance while pt washed her hands. Pt educated on importance of using incentive spirometer and performed x10 breaths with cuing to reach goal. Pt left sitting in w/c with needs in reach, lines intact, and seat belt alarm on.   Therapy Documentation Precautions:  Precautions Precautions: Fall Precaution Comments: O2 dependent Restrictions Weight Bearing Restrictions: No  Pain:   No reports of pain during session.   Therapy/Group: Individual Therapy  Tawana Scale, PT, DPT 05/12/2019, 7:56 AM

## 2019-05-12 NOTE — Progress Notes (Signed)
PHYSICAL MEDICINE & REHABILITATION PROGRESS NOTE   Subjective/Complaints: EKG normal 5/14 No further CP since last noc Pt states location was left parasternal and Left mid axillary, no SOB with episode,   ROS:neg CP, SOB, N/V/D  Objective:   Dg Chest Port 1 View  Result Date: 05/11/2019 CLINICAL DATA:  Chest pain EXAM: PORTABLE CHEST 1 VIEW COMPARISON:  08/29/2018 FINDINGS: With the cardiac silhouette is not significantly enlarged. Aortic calcifications are again noted. There is an airspace opacity at the left lung base. No pneumothorax. No large pleural effusion. No acute osseous abnormality. IMPRESSION: 1. No acute cardiopulmonary process. 2. Stable opacity at the left lung base favored to represent atelectasis. Electronically Signed   By: Constance Holster M.D.   On: 05/11/2019 21:36   Recent Labs    05/11/19 0452 05/11/19 1707  HGB 6.7* 8.1*  HCT 22.1* 26.4*   No results for input(s): NA, K, CL, CO2, GLUCOSE, BUN, CREATININE, CALCIUM in the last 72 hours.  Intake/Output Summary (Last 24 hours) at 05/12/2019 0804 Last data filed at 05/11/2019 1818 Gross per 24 hour  Intake 1077 ml  Output 2050 ml  Net -973 ml     Physical Exam: Vital Signs Blood pressure 131/62, pulse 79, temperature 98.6 F (37 C), resp. rate 18, height 4\' 9"  (1.448 m), weight 61 kg, SpO2 92 %.   Constitutional: No distress . Vital signs reviewed. HEENT: EOMI, oral membranes moist Neck: supple Cardiovascular: RRR without murmur. No JVD    Respiratory: CTA Bilaterally without wheezes or rales. Normal effort    GI: BS +, non-tender, non-distended  Extremities: No clubbing, cyanosis, or edema Skin: No evidence of breakdown, no evidence of rash Neurologic: alert, still distracted however, tangential.  Cranial nerves II through XII intact, motor strength is 5/5 in right deltoid, bicep, tricep, grip, hip flexor, knee extensors, ankle dorsiflexor and plantar flexor 3- Left delt bi tri, 3- grip,  2- L hip/knee ext synergy--stable Sensory exam normal sensation to light touch and proprioception in bilateral upper and lower extremities--unchanged  Musculoskeletal: bilateral hand/wrist somewhat sensitive to touch, also with shoulder pain during ROM of RUE Pain to palpation in parasternal area on Left side as well as Left costal margin Psych:sl anxious  Assessment/Plan: 1. Functional deficits secondary to RIght pontine infarct which require 3+ hours per day of interdisciplinary therapy in a comprehensive inpatient rehab setting.  Physiatrist is providing close team supervision and 24 hour management of active medical problems listed below.  Physiatrist and rehab team continue to assess barriers to discharge/monitor patient progress toward functional and medical goals  Care Tool:  Bathing    Body parts bathed by patient: Chest, Abdomen, Right upper leg, Left upper leg, Face   Body parts bathed by helper: Right arm, Left arm, Front perineal area, Buttocks, Left lower leg, Right lower leg     Bathing assist Assist Level: Maximal Assistance - Patient 24 - 49%     Upper Body Dressing/Undressing Upper body dressing   What is the patient wearing?: Pull over shirt    Upper body assist Assist Level: Maximal Assistance - Patient 25 - 49%    Lower Body Dressing/Undressing Lower body dressing      What is the patient wearing?: Pants, Incontinence brief     Lower body assist Assist for lower body dressing: Maximal Assistance - Patient 25 - 49%     Toileting Toileting    Toileting assist Assist for toileting: Total Assistance - Patient < 25%  Transfers Chair/bed transfer  Transfers assist  Chair/bed transfer activity did not occur: (squat pivot/lateral scoot)  Chair/bed transfer assist level: Maximal Assistance - Patient 25 - 49%     Locomotion Ambulation   Ambulation assist   Ambulation activity did not occur: Safety/medical concerns  Assist level: 2  helpers Assistive device: Walker-rolling Max distance: 5 ft   Walk 10 feet activity   Assist  Walk 10 feet activity did not occur: Safety/medical concerns        Walk 50 feet activity   Assist Walk 50 feet with 2 turns activity did not occur: Safety/medical concerns         Walk 150 feet activity   Assist Walk 150 feet activity did not occur: Safety/medical concerns         Walk 10 feet on uneven surface  activity   Assist Walk 10 feet on uneven surfaces activity did not occur: Safety/medical concerns         Wheelchair     Assist Will patient use wheelchair at discharge?: Yes Type of Wheelchair: Manual    Wheelchair assist level: Dependent - Patient 0% Max wheelchair distance: 150 ft    Wheelchair 50 feet with 2 turns activity    Assist        Assist Level: Dependent - Patient 0%   Wheelchair 150 feet activity     Assist     Assist Level: Dependent - Patient 0%    Medical Problem List and Plan: 1.Functional deficits and left hemiparesissecondary to right pontine infarct on 05/01/2019 CIR PT, OT, SLP, cont rehab 2. Antithrombotics: -DVT/anticoagulation:Pharmaceutical:Lovenox -antiplatelet therapy: ASA/Plavix X 3 weeks followed by ASA. 3.Headaches/Chronic back pain/Pain Management:Voltaren gel for hands, increased to 4 times daily -Gabapentin  100 mg HS. -  tramadol prn with local measuresincluding heat -Lidocain patches/heat/ice.Oxycodone not daily use per records/PMP aware reviewed.     4. Mood:LCSW to follow for evaluation and support. -antipsychotic agents: N/A 5. Neuropsych: This patientis notcapable of making decisions onherown behalf.   -some improvement  -probably underlying cognitive deficits at baseline  -neuropsych to see  -continue to support pt at bedside  6. Skin/Wound Care:LCSW tio follow for evaluation and support. 7.  Fluids/Electrolytes/Nutrition:Monitor I/O.  D3 thins.  8. HTN: Monitor BP bid--permissive HTN and normalize in 5-7 days.  9. COPD./chronic hypoxemic respiratory failure: Question maintainence medications. Oxygen dependent.added prn neb -OOB, IS 10. Pre renal azotemia resolved with hydration  I: 421ml  Repeat BUN creatinine normal, GFR normal 11. H/o depression: Continue cymbalta.  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  13. H/o B12 deficiency/Iron deficiency anemia: Hgb 8.4 at admission with mild leucocytosis.   Added iron supplement.  -most recent hgb 8.1, continue with B12 supplementation We will check fecal occult blood- still pending  transfused 1 U with expected improvement in Hgb Hgb increased from 6.7 to 8.1  -B12 supp 14. Dyslipidemia: Now on Lipitor.  15.  Episode of CP normal EKG, exam does show tenderness in the Left parasternal area, troponins neg, CXR showing expected atelectasis Likely MSK  LOS: 10 days A FACE TO [place] [facility] Alger Kerstein 05/12/2019, 8:04 AM

## 2019-05-12 NOTE — Progress Notes (Signed)
Occupational Therapy Session Note  Patient Details  Name: [name] MRN: [id number] Date of Birth: Feb 02, 1936  Today's Date: 05/12/2019 OT Individual Time: 5188-4166 OT Individual Time Calculation (min): 73 min    Short Term Goals: Week 2:  OT Short Term Goal 1 (Week 2): Pt will complete UB bathing with min assist in supported sitting. OT Short Term Goal 2 (Week 2): Pt will perform LB bathing sit to stand with mod assist.  OT Short Term Goal 3 (Week 2): Pt will complete toilet transfer with mod assist using the RW for support.  OT Short Term Goal 4 (Week 2): Pt will use the RUE for functional use with selfcare tasks and mod facilitation.   OT Short Term Goal 5 (Week 2): Pt will perform UB dressing with mod assist for pullover shirt.  Skilled Therapeutic Interventions/Progress Updates:    Pt completed toileting to start session with nursing already having pt in the bathroom on the 3:1 with use of the [place].  She needed max assist for toilet hygiene and clothing management with use of the [place] to pull up on.  She was able to then transfer out to the wheelchair for grooming tasks at the sink.  Supervision with use of the LUE and min assist to setup toothbrush for brushing her teeth.  She then brushed them with use of the RUE.  Finished session with stand pivot transfer with max assist to the bed using the RW with hand splint on the left side.  She then needed mod assist for transition to supine.  Pt left with HOB up and call button and phone in reach and bed alarm in place.    Therapy Documentation Precautions:  Precautions Precautions: Fall Precaution Comments: O2 dependent Restrictions Weight Bearing Restrictions: No  Pain: Pain Assessment Pain Scale: Faces Faces Pain Scale: Hurts a little bit Pain Type: Neuropathic pain Pain Location: Hand Pain Orientation: Right Pain Descriptors / Indicators: Discomfort Pain Onset: With Activity ADL: ADL Eating: Set up Where  Assessed-Eating: Wheelchair Grooming: Minimal assistance Where Assessed-Grooming: Wheelchair Upper Body Bathing: Maximal assistance Where Assessed-Upper Body Bathing: Wheelchair Lower Body Bathing: Dependent Where Assessed-Lower Body Bathing: Bed level Upper Body Dressing: Maximal assistance Where Assessed-Upper Body Dressing: Wheelchair Lower Body Dressing: Dependent Where Assessed-Lower Body Dressing: Bed level Toileting: Dependent Where Assessed-Toileting: Bed level Toilet Transfer: Dependent Toilet Transfer Method: Stand pivot Toilet Transfer Equipment: Bedside commode   Therapy/Group: Individual Therapy  Zula Hovsepian OTR/L 05/12/2019, 3:49 PM

## 2019-05-12 NOTE — Progress Notes (Signed)
Speech Language Pathology Daily Session Note  Patient Details  Name: [name] MRN: [id number] Date of Birth: 01-18-1936  Today's Date: 05/12/2019 SLP Individual Time: 4315-4008 SLP Individual Time Calculation (min): 44 min  Short Term Goals: Week 2: SLP Short Term Goal 1 (Week 2): Patient will demonstrate intellectual awareness and identify 2 physical and 2 cognitive deficits with Min A multimodal cues.  SLP Short Term Goal 2 (Week 2): Patient will demonstrate functional problem solving for basic tasks with Mod A multimodal cues.  SLP Short Term Goal 3 (Week 2): Patient will demonstrate sustained attention to functional tasks for 10 minutes with Min A verbal cues for redirection.  SLP Short Term Goal 4 (Week 2): Pt will utilize compensatory memory aids to recall new and/or daily information with ModA verbal and questions cues  Skilled Therapeutic Interventions: Skilled ST services focused on cognitive skills. SLP facilitated repositioning in bed with nurse present for PO consumption of medication. Pt requested bedpan and following 1 step bed ambulation commands with min A verbal cues. Pt demonstrated recall of basic events from yesterday (therapy in bed due to blood transfusion) and recalled instructions to use incentive spirometer every hour with cues for repetition (x10) only, however stated " Oh I keep forgetting to do it." SLP posted visual aid in room to aid in recall. Pt stated her reminded her of everything she only recalled when to take medication out of routine. SLP discounted memory notebook, due to pt's inability to read consistently without reading glasses present and limited use by pt. Pt demonstrated ability to preform x10 inhales reaching to level 500 on incentive spirometer with assistance holding device, SLP instructed pt to request assistance every hour using call bell and placed clock on table to reference time. Pt was left with call bell within reach and bed alarm set. SLP  recommends to continue skilled ST services.   As of note, pt did not recall/request assistance with incentive spirometer in PT session, nor recalled having PT when questioned by SLP. SLP created large print check list of times to use device, placing on pt's bedside table and provided instruction. Pt stated understanding.      Pain    Therapy/Group: Individual Therapy  Ardit Danh  [facility] 05/12/2019, 9:21 AM

## 2019-05-13 NOTE — Progress Notes (Signed)
[name] is a 83 y.o. female admitted for CIR with functional deficits secondary to right pontine infarction  Past Medical History:  Diagnosis Date  . CAP (community acquired pneumonia)    multiple admissions  . Chronic back pain 08/22/2018  . Colitis 03/19/2014  . Compression fx, lumbar spine ([place])   . COPD (chronic obstructive pulmonary disease) ([place])   . Delirium   . Hypertension   . On home oxygen therapy 06-14-13   continuos 2.5l/m nasally-24/7  . Osteoporosis   . Sacral fracture ([place]) 2014     Subjective: No new complaints. No new problems. Slept well. Feeling OK.  Objective: Vital signs in last 24 hours: Temp:  [97.4 F (36.3 C)-97.8 F (36.6 C)] 97.6 F (36.4 C) (05/16 0444) Pulse Rate:  [80-86] 80 (05/16 0444) Resp:  [18-19] 18 (05/16 0444) BP: (126-130)/(58-63) 130/58 (05/16 0444) SpO2:  [97 %-98 %] 97 % (05/16 0444) Weight change:  Last BM Date: 05/12/19  Intake/Output from previous day: 05/15 0701 - 05/16 0700 In: 340 [P.O.:340] Out: -   Patient Vitals for the past 24 hrs:  BP Temp Temp src Pulse Resp SpO2  05/13/19 0444 (!) 130/58 97.6 F (36.4 C) Oral 80 18 97 %  05/12/19 1929 128/63 (!) 97.4 F (36.3 C) Oral 85 18 98 %  05/12/19 1434 126/63 97.8 F (36.6 C) Oral 86 19 98 %     Physical Exam General: No apparent distress   HEENT: not dry; nasal cannula O2 in place Lungs: Normal effort. Lungs clear to auscultation, no crackles or wheezes. Cardiovascular: Regular rate and rhythm, no edema Abdomen: S/NT/ND; BS(+) Musculoskeletal:  unchanged Neurological: No new neurological deficits with left-sided weakness    Skin: clear   Mental state: Alert, oriented, cooperative    Lab Results: BMET    Component Value Date/Time   NA 139 05/08/2019 0716   K 3.9 05/08/2019 0716   CL 94 (L) 05/08/2019 0716   CO2 32 05/08/2019 0716   GLUCOSE 107 (H) 05/08/2019 0716   BUN 23 05/08/2019 0716   CREATININE 0.74 05/08/2019 0716   CALCIUM 8.7 (L)  05/08/2019 0716   GFRNONAA >60 05/08/2019 0716   GFRAA >60 05/08/2019 0716   CBC    Component Value Date/Time   WBC 8.6 05/08/2019 0716   RBC 2.87 (L) 05/08/2019 0716   HGB 8.1 (L) 05/11/2019 1707   HCT 26.4 (L) 05/11/2019 1707   PLT 565 (H) 05/08/2019 0716   MCV 86.8 05/08/2019 0716   MCH 25.8 (L) 05/08/2019 0716   MCHC 29.7 (L) 05/08/2019 0716   RDW 13.9 05/08/2019 0716   LYMPHSABS 1.2 05/03/2019 0521   MONOABS 0.7 05/03/2019 0521   EOSABS 0.2 05/03/2019 0521   BASOSABS 0.0 05/03/2019 0521    Studies/Results: Dg Chest Port 1 View  Result Date: 05/11/2019 CLINICAL DATA:  Chest pain EXAM: PORTABLE CHEST 1 VIEW COMPARISON:  08/29/2018 FINDINGS: With the cardiac silhouette is not significantly enlarged. Aortic calcifications are again noted. There is an airspace opacity at the left lung base. No pneumothorax. No large pleural effusion. No acute osseous abnormality. IMPRESSION: 1. No acute cardiopulmonary process. 2. Stable opacity at the left lung base favored to represent atelectasis. Electronically Signed   By: Constance Holster M.D.   On: 05/11/2019 21:36    Medications: I have reviewed the patient's current medications.  Assessment/Plan:  Functional deficits secondary to right pontine infarction.  Continue CIR DVT prophylaxis.  Continue Lovenox Hypertension.  BP remains well controlled COPD  with chronic hypoxic respiratory insufficiency.  Continue O2 and present regimen    Length of stay, days: 11  Marletta Lor , MD 05/13/2019, 9:44 AM

## 2019-05-14 ENCOUNTER — Inpatient Hospital Stay (HOSPITAL_COMMUNITY): Payer: Medicare Other

## 2019-05-14 NOTE — Progress Notes (Signed)
[name] is a 83 y.o. female admitted for CIR following a right pontine infarction with functional deficits  Past Medical History:  Diagnosis Date  . CAP (community acquired pneumonia)    multiple admissions  . Chronic back pain 08/22/2018  . Colitis 03/19/2014  . Compression fx, lumbar spine ([place])   . COPD (chronic obstructive pulmonary disease) ([place])   . Delirium   . Hypertension   . On home oxygen therapy 06-14-13   continuos 2.5l/m nasally-24/7  . Osteoporosis   . Sacral fracture ([place]) 2014     Subjective: No new complaints. No new problems. Slept well.  No cough.  Pulmonary status stable.  Objective: Vital signs in last 24 hours: Temp:  [98 F (36.7 C)-98.2 F (36.8 C)] 98.2 F (36.8 C) (05/17 0524) Pulse Rate:  [78-86] 78 (05/17 0524) Resp:  [18-22] 18 (05/17 0524) BP: (123-140)/(49-63) 123/63 (05/17 0524) SpO2:  [98 %] 98 % (05/17 0524) Weight change:  Last BM Date: 05/13/19  Intake/Output from previous day: 05/16 0701 - 05/17 0700 In: 360 [P.O.:360] Out: -   Patient Vitals for the past 24 hrs:  BP Temp Temp src Pulse Resp SpO2  05/14/19 0524 123/63 98.2 F (36.8 C) Oral 78 18 98 %  05/13/19 1944 (!) 135/49 - - 86 - -  05/13/19 1943 (!) 140/52 98 F (36.7 C) - 84 (!) 22 98 %  05/13/19 1549 (!) 134/54 98 F (36.7 C) - 82 18 98 %     Physical Exam General: No apparent distress   HEENT: not dry; nasal cannula O2 in place Lungs: Normal effort. Lungs clear to auscultation, no crackles or wheezes.  Clear anterolaterally Cardiovascular: Regular rate and rhythm, no edema Abdomen: S/NT/ND; BS(+) Musculoskeletal:  unchanged Neurological: No new neurological deficits with left-sided weakness Extremities.  No edema Skin: clear   Mental state: Alert, oriented, cooperative    Lab Results: BMET    Component Value Date/Time   NA 139 05/08/2019 0716   K 3.9 05/08/2019 0716   CL 94 (L) 05/08/2019 0716   CO2 32 05/08/2019 0716   GLUCOSE 107 (H)  05/08/2019 0716   BUN 23 05/08/2019 0716   CREATININE 0.74 05/08/2019 0716   CALCIUM 8.7 (L) 05/08/2019 0716   GFRNONAA >60 05/08/2019 0716   GFRAA >60 05/08/2019 0716   CBC    Component Value Date/Time   WBC 8.6 05/08/2019 0716   RBC 2.87 (L) 05/08/2019 0716   HGB 8.1 (L) 05/11/2019 1707   HCT 26.4 (L) 05/11/2019 1707   PLT 565 (H) 05/08/2019 0716   MCV 86.8 05/08/2019 0716   MCH 25.8 (L) 05/08/2019 0716   MCHC 29.7 (L) 05/08/2019 0716   RDW 13.9 05/08/2019 0716   LYMPHSABS 1.2 05/03/2019 0521   MONOABS 0.7 05/03/2019 0521   EOSABS 0.2 05/03/2019 0521   BASOSABS 0.0 05/03/2019 0521     Medications: I have reviewed the patient's current medications.  Assessment/Plan:  Functional deficits following right pontine infarction with left-sided weakness.  Continue CIR Hypertension.  Remains well controlled COPD with chronic hypoxic respiratory insufficiency.  Continue present regimen and chronic nasal cannula O2 DVT prophylaxis continue Lovenox    Length of stay, days: 12  Marletta Lor , MD 05/14/2019, 9:48 AM

## 2019-05-14 NOTE — Progress Notes (Signed)
Physical Therapy Session Note  Patient Details  Name: [name] MRN: [id number] Date of Birth: [birth year]  Today's Date: 05/14/2019 PT Individual Time: 1400-1513 PT Individual Time Calculation (min): 73 min   Short Term Goals: Week 2:  PT Short Term Goal 1 (Week 2): Pt will ambulated x 10 ft with mod assist and RW PT Short Term Goal 2 (Week 2): Pt will perform bed mobility bed mobility  PT Short Term Goal 3 (Week 2): Pt will perform car transfer with mod assist  Skilled Therapeutic Interventions/Progress Updates:    Pt supine in bed upon PT arrival, agreeable to therapy tx and denies pain. Pt with NT asking to use the bedpan when PT arrived. Therapist encouraged the pt to get up OOB and go to the toilet instead of using the bedpan. Pt transferred to sitting EOB with mod assist and performed sit<>stand within stedy mod assist. Pt trransferred bed>toilet with stedy, dependent transfer using lift. Pt continent of bowel and bladder this session, assist for peri care and clothing management. Pt performed w/c level upper and lower body bathing/drssing this session. Pt performed sit<>stand within stedy mod assist while therapist assisted to wash buttocks and LEs, and then to don pants. Pt performed sit<>stand within stedy with mod assist and maintained standing balance within stedy min assist while brushing teeth. Pt transferred to w/c with stedy. Pt transported to the gym. Pt ambulated x 12 ft this session with RW and mod assist+2 for w/c follow, cues for R lateral weightshift and increased L step length. Pt worked on standing balance and R lateral weightshift in order to reach/toss horseshoes this session, x 2 trials with min-mod assist for balance and facilitation for upright posutre/weightshifting. Pt transported back to room and left in w/c with needs in reach and chair alarm set.   Therapy Documentation Precautions:  Precautions Precautions: Fall Precaution Comments: O2  dependent Restrictions Weight Bearing Restrictions: No    Therapy/Group: Individual Therapy  Netta Corrigan, PT, DPT 05/14/2019, 12:12 PM

## 2019-05-15 ENCOUNTER — Inpatient Hospital Stay (HOSPITAL_COMMUNITY): Payer: Medicare Other

## 2019-05-15 ENCOUNTER — Inpatient Hospital Stay (HOSPITAL_COMMUNITY): Payer: Medicare Other | Admitting: Occupational Therapy

## 2019-05-15 LAB — BASIC METABOLIC PANEL
Anion gap: 7 (ref 5–15)
BUN: 29 mg/dL — ABNORMAL HIGH (ref 8–23)
CO2: 33 mmol/L — ABNORMAL HIGH (ref 22–32)
Calcium: 8.5 mg/dL — ABNORMAL LOW (ref 8.9–10.3)
Chloride: 95 mmol/L — ABNORMAL LOW (ref 98–111)
Creatinine, Ser: 0.85 mg/dL (ref 0.44–1.00)
GFR calc Af Amer: >60 mL/min (ref >60)
GFR calc non Af Amer: >60 mL/min (ref >60)
Glucose, Bld: 126 mg/dL — ABNORMAL HIGH (ref 70–99)
Potassium: 4.3 mmol/L (ref 3.5–5.1)
Sodium: 135 mmol/L (ref 135–145)

## 2019-05-15 LAB — CBC
HCT: 22.9 % — ABNORMAL LOW (ref 36.0–46.0)
HCT: 24.5 % — ABNORMAL LOW (ref 36.0–46.0)
Hemoglobin: 7 g/dL — ABNORMAL LOW (ref 12.0–15.0)
Hemoglobin: 7.4 g/dL — ABNORMAL LOW (ref 12.0–15.0)
MCH: 27 pg (ref 26.0–34.0)
MCH: 26.8 pg (ref 26.0–34.0)
MCHC: 30.6 g/dL (ref 30.0–36.0)
MCHC: 30.2 g/dL (ref 30.0–36.0)
MCV: 88.4 fL (ref 80.0–100.0)
MCV: 88.8 fL (ref 80.0–100.0)
Platelets: 548 10*3/uL — ABNORMAL HIGH (ref 150–400)
Platelets: 552 10*3/uL — ABNORMAL HIGH (ref 150–400)
RBC: 2.59 MIL/uL — ABNORMAL LOW (ref 3.87–5.11)
RBC: 2.76 MIL/uL — ABNORMAL LOW (ref 3.87–5.11)
RDW: 15.5 % (ref 11.5–15.5)
RDW: 15.3 % (ref 11.5–15.5)
WBC: 12.4 10*3/uL — ABNORMAL HIGH (ref 4.0–10.5)
WBC: 10.9 10*3/uL — ABNORMAL HIGH (ref 4.0–10.5)
nRBC: 0 % (ref 0.0–0.2)
nRBC: 0 % (ref 0.0–0.2)

## 2019-05-15 LAB — URIC ACID: Uric Acid, Serum: 4.6 mg/dL (ref 2.5–7.1)

## 2019-05-15 NOTE — Plan of Care (Signed)
  Problem: Consults Goal: RH STROKE PATIENT EDUCATION Description See Patient Education module for education specifics  Outcome: Progressing Goal: Nutrition Consult-if indicated Outcome: Progressing   Problem: RH BOWEL ELIMINATION Goal: RH STG MANAGE BOWEL WITH ASSISTANCE Description STG Manage Bowel with Max Assistance.  Outcome: Progressing Flowsheets (Taken 05/15/2019 1553) STG: Pt will manage bowels with assistance: 3-Moderate assistance Goal: RH STG MANAGE BOWEL W/MEDICATION W/ASSISTANCE Description STG Manage Bowel with Medication with max Assistance.  Outcome: Progressing Flowsheets (Taken 05/15/2019 1553) STG: Pt will manage bowels with medication with assistance: 3-Moderate assistance   Problem: RH BLADDER ELIMINATION Goal: RH STG MANAGE BLADDER WITH ASSISTANCE Description STG Manage Bladder With max Assistance  Outcome: Progressing Flowsheets (Taken 05/15/2019 1553) STG: Pt will manage bladder with assistance: 3-Moderate assistance   Problem: RH SKIN INTEGRITY Goal: RH STG SKIN FREE OF INFECTION/BREAKDOWN Description Skin to remain free from breakdown while on rehab with max assist.  Outcome: Progressing Goal: RH STG MAINTAIN SKIN INTEGRITY WITH ASSISTANCE Description STG Maintain Skin Integrity With max Assistance and following following a turning and rotation schedule.  Outcome: Progressing Flowsheets (Taken 05/15/2019 1553) STG: Maintain skin integrity with assistance: 3-Moderate assistance Goal: RH STG ABLE TO PERFORM INCISION/WOUND CARE W/ASSISTANCE Description STG Able To Perform Wound/Skin breakdown Care With max Assistance.  Outcome: Progressing Flowsheets (Taken 05/15/2019 1553) STG: Pt will be able to perform incision/wound care with assistance: 3-Moderate assistance   Problem: RH SAFETY Goal: RH STG ADHERE TO SAFETY PRECAUTIONS W/ASSISTANCE/DEVICE Description STG Adhere to Safety Precautions With max Assistance and using appropriate safety and  assistive Devices.  Outcome: Progressing Flowsheets (Taken 05/15/2019 1553) STG:Pt will adhere to safety precautions with assistance/device: 3-Moderate assistance   Problem: RH PAIN MANAGEMENT Goal: RH STG PAIN MANAGED AT OR BELOW PT'S PAIN GOAL Description <4 on a 0-10 pain scale.  Outcome: Progressing   Problem: RH KNOWLEDGE DEFICIT Goal: RH STG INCREASE KNOWLEDGE OF HYPERTENSION Description Patient to demonstrate knowledge on HTN medications, BP parameters, and follow-up care with the MD post discharge with mod assist from rehab staff.  Outcome: Progressing Goal: RH STG INCREASE KNOWLEDGE OF STROKE PROPHYLAXIS Description Patient to demonstrate knowledge of Stroke prevention medications, dietary restrictions, and follow-up care with the MD post discharge with mod assist from rehab staff.  Outcome: Progressing

## 2019-05-15 NOTE — Progress Notes (Signed)
Physical Therapy Session Note  Patient Details  Name: [name] MRN: [id number] Date of Birth: [birth year]  Today's Date: 05/15/2019 PT Individual Time: 1048-1130 PT Individual Time Calculation (min): 42 min   Short Term Goals: Week 2:  PT Short Term Goal 1 (Week 2): Pt will ambulated x 10 ft with mod assist and RW PT Short Term Goal 2 (Week 2): Pt will perform bed mobility bed mobility  PT Short Term Goal 3 (Week 2): Pt will perform car transfer with mod assist  Skilled Therapeutic Interventions/Progress Updates:    Pt supine in bed upon PT arrival, agreeable to therapy tx and denies pain at rest. Pt transferred to sitting EOB this session with mod assist, in sitting cues to correct posterior lean. Pt maintained sitting balance this session with supervision while seated EOB working on incentive spirometry, therapist providing cues for techniques. Pt performed stand pivot transfer this session to the w/c with RW and mod assist, cues for foot placement and techniques. Pt transported to the gym. Pt ambulated x 15 ft this session with RW and mod assist, +2 for w/c follow and O2 tank management, cues for increased step length and upright posture, assist with RW management. Pt worked on standing balance and R lateral weightshifting to perform horseshoe reaching task/toss activity, min-mod assist for balance with facilitation for increased hip extension. Pt transported back to room and left seated in w/c with needs in reach and chair alarm set.   Therapy Documentation Precautions:  Precautions Precautions: Fall Precaution Comments: O2 dependent Restrictions Weight Bearing Restrictions: No   Therapy/Group: Individual Therapy  Netta Corrigan, PT, DPT 05/15/2019, 7:50 AM

## 2019-05-15 NOTE — Progress Notes (Signed)
Occupational Therapy Session Note  Patient Details  Name: [name] MRN: [id number] Date of Birth: July 01, 1936  Today's Date: 05/15/2019 OT Individual Time: 1300-1419 OT Individual Time Calculation (min): 79 min    Short Term Goals: Week 2:  OT Short Term Goal 1 (Week 2): Pt will complete UB bathing with min assist in supported sitting. OT Short Term Goal 2 (Week 2): Pt will perform LB bathing sit to stand with mod assist.  OT Short Term Goal 3 (Week 2): Pt will complete toilet transfer with mod assist using the RW for support.  OT Short Term Goal 4 (Week 2): Pt will use the RUE for functional use with selfcare tasks and mod facilitation.   OT Short Term Goal 5 (Week 2): Pt will perform UB dressing with mod assist for pullover shirt.  Skilled Therapeutic Interventions/Progress Updates:    Pt worked on shower and dressing during session.  [name] was utilized for transfer from the wheelchair to the tub bench with min assist for sit to stand pulling up on the middle bar.  She needed max assist to manage dirty clothing in order to remove everything for shower.  Pt with overall slow movements with report of pain in the right wrist and shoulder.  In addition, she continues to demonstrate limited selective attention, demonstrating continued internal distractions with her conversation.  She needed overall mod assist for UB bathing with max instructional cueing and max assist for LB bathing sit to stand.  Therapist had to assist with rinsing all soap off of her.  [name] was utilized for transfer to the 3:1 over the toilet as well as for transfer back to the bed.  Max assist for all aspects of dressing secondary to time as well as pt's decreased BUE AROM and strength.  Max assist was needed for sit to supine to complete session.  Call button and phone in reach with alarm in place.    Therapy Documentation Precautions:  Precautions Precautions: Fall Precaution Comments: O2  dependent Restrictions Weight Bearing Restrictions: No  Pain: Pain Assessment Pain Scale: Faces Pain Score: 0-No pain Faces Pain Scale: Hurts a little bit Pain Type: Acute pain Pain Location: Arm Pain Orientation: Right Pain Descriptors / Indicators: Discomfort Pain Onset: With Activity Pain Intervention(s): Repositioned;Emotional support Multiple Pain Sites: No ADL: See Care Tool Section for some details of ADLs  Therapy/Group: Individual Therapy  Gabrielly Mccrystal OTR/L 05/15/2019, 3:53 PM

## 2019-05-15 NOTE — Progress Notes (Signed)
[place] PHYSICAL MEDICINE & REHABILITATION PROGRESS NOTE   Subjective/Complaints:   Reviewed Hgb, dropped again since last week, stool OB not obtained, spoke to RN   ROS:neg CP, SOB, N/V/D  Objective:   No results found. Recent Labs    05/15/19 0629  WBC 12.4*  HGB 7.0*  HCT 22.9*  PLT 548*   Recent Labs    05/15/19 0629  NA 135  K 4.3  CL 95*  CO2 33*  GLUCOSE 126*  BUN 29*  CREATININE 0.85  CALCIUM 8.5*    Intake/Output Summary (Last 24 hours) at 05/15/2019 0801 Last data filed at 05/15/2019 0054 Gross per 24 hour  Intake 600 ml  Output -  Net 600 ml     Physical Exam: Vital Signs Blood pressure 126/60, pulse 89, temperature 98.3 F (36.8 C), temperature source Oral, resp. rate 17, height 4\' 9"  (1.448 m), weight 61 kg, SpO2 94 %.   Constitutional: No distress . Vital signs reviewed. HEENT: EOMI, oral membranes moist Neck: supple Cardiovascular: RRR without murmur. No JVD    Respiratory: CTA Bilaterally without wheezes or rales. Normal effort    GI: BS +, non-tender, non-distended  Extremities: No clubbing, cyanosis, or edema Skin: No evidence of breakdown, no evidence of rash Neurologic: alert, still distracted however, tangential.  Cranial nerves II through XII intact, motor strength is 5/5 in right deltoid, bicep, tricep, grip, hip flexor, knee extensors, ankle dorsiflexor and plantar flexor 3- Left delt bi tri, 3- grip, 2- L hip/knee ext synergy--stable Sensory exam normal sensation to light touch and proprioception in bilateral upper and lower extremities--unchanged  Musculoskeletal: RUE Pain to palpation in parasternal area on Left side as well as Left costal margin Psych:sl anxious  Assessment/Plan: 1. Functional deficits secondary to RIght pontine infarct which require 3+ hours per day of interdisciplinary therapy in a comprehensive inpatient rehab setting.  Physiatrist is providing close team supervision and 24 hour management of active  medical problems listed below.  Physiatrist and rehab team continue to assess barriers to discharge/monitor patient progress toward functional and medical goals  Care Tool:  Bathing    Body parts bathed by patient: Chest, Abdomen, Right upper leg, Left upper leg, Face   Body parts bathed by helper: Right arm, Left arm, Front perineal area, Buttocks, Left lower leg, Right lower leg     Bathing assist Assist Level: Maximal Assistance - Patient 24 - 49%     Upper Body Dressing/Undressing Upper body dressing   What is the patient wearing?: Pull over shirt    Upper body assist Assist Level: Maximal Assistance - Patient 25 - 49%    Lower Body Dressing/Undressing Lower body dressing      What is the patient wearing?: Pants, Incontinence brief     Lower body assist Assist for lower body dressing: Maximal Assistance - Patient 25 - 49%     Toileting Toileting    Toileting assist Assist for toileting: Total Assistance - Patient < 25%     Transfers Chair/bed transfer  Transfers assist  Chair/bed transfer activity did not occur: (squat pivot/lateral scoot)  Chair/bed transfer assist level: Maximal Assistance - Patient 25 - 49%     Locomotion Ambulation   Ambulation assist   Ambulation activity did not occur: Safety/medical concerns  Assist level: 2 helpers Assistive device: Walker-rolling Max distance: 12 ft   Walk 10 feet activity   Assist  Walk 10 feet activity did not occur: Safety/medical concerns  Assist level: 2 helpers Assistive device:  Walker-rolling   Walk 50 feet activity   Assist Walk 50 feet with 2 turns activity did not occur: Safety/medical concerns         Walk 150 feet activity   Assist Walk 150 feet activity did not occur: Safety/medical concerns         Walk 10 feet on uneven surface  activity   Assist Walk 10 feet on uneven surfaces activity did not occur: Safety/medical concerns         Wheelchair     Assist  Will patient use wheelchair at discharge?: Yes Type of Wheelchair: Manual    Wheelchair assist level: Dependent - Patient 0% Max wheelchair distance: 150 ft    Wheelchair 50 feet with 2 turns activity    Assist        Assist Level: Dependent - Patient 0%   Wheelchair 150 feet activity     Assist     Assist Level: Dependent - Patient 0%    Medical Problem List and Plan: 1.Functional deficits and left hemiparesissecondary to right pontine infarct on 05/01/2019 CIR PT, OT, SLP, cont rehab 2. Antithrombotics: -DVT/anticoagulation:Pharmaceutical:Lovenox -antiplatelet therapy: ASA/Plavix X 3 weeks followed by ASA. 3.Headaches/Chronic back pain/Pain Management:Voltaren gel for hands, increased to 4 times daily -Gabapentin  100 mg HS. -  tramadol prn with local measuresincluding heat -Lidocain patches/heat/ice.Oxycodone not daily use per records/PMP aware reviewed.     4. Mood:LCSW to follow for evaluation and support. -antipsychotic agents: N/A 5. Neuropsych: This patientis notcapable of making decisions onherown behalf.   -some improvement  -probably underlying cognitive deficits at baseline  -neuropsych to see  -continue to support pt at bedside  6. Skin/Wound Care:LCSW tio follow for evaluation and support. 7. Fluids/Electrolytes/Nutrition:Monitor I/O.  D3 thins.  8. HTN: Monitor BP bid--permissive HTN and normalize in 5-7 days.  9. COPD./chronic hypoxemic respiratory failure: Question maintainence medications. Oxygen dependent.added prn neb -OOB, IS 10. Pre renal azotemia resolved with hydration  I: 440ml  Repeat BUN creatinine normal, GFR normal 11. H/o depression: Continue cymbalta.  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  13. H/o B12 deficiency/Iron deficiency anemia: Hgb 8.4 at admission with mild leucocytosis.   Added iron  supplement.  -most recent hgb 8.1, continue with B12 supplementation We will check fecal occult blood- still pending  transfused 1 U with expected improvement in Hgb Dropped again, if stool OB + call GI  -B12 supp 14. Dyslipidemia: Now on Lipitor.  15.  Episode of CP normal EKG, exam does show tenderness in the Left parasternal area, troponins neg, CXR showing expected atelectasis Likely MSK-Resolved 16.  RIght wrist synovitis will check Xray and serum urate- suspect gout  LOS: 13 days A FACE TO [place] 05/15/2019, 8:01 AM

## 2019-05-15 NOTE — Progress Notes (Signed)
Speech Language Pathology Daily Session Note  Patient Details  Name: [name] MRN: [id number] Date of Birth: 1936/01/21  Today's Date: 05/15/2019 SLP Individual Time: 9563-8756 SLP Individual Time Calculation (min): 58 min  Short Term Goals: Week 2: SLP Short Term Goal 1 (Week 2): Patient will demonstrate intellectual awareness and identify 2 physical and 2 cognitive deficits with Min A multimodal cues.  SLP Short Term Goal 2 (Week 2): Patient will demonstrate functional problem solving for basic tasks with Mod A multimodal cues.  SLP Short Term Goal 3 (Week 2): Patient will demonstrate sustained attention to functional tasks for 10 minutes with Min A verbal cues for redirection.  SLP Short Term Goal 4 (Week 2): Pt will utilize compensatory memory aids to recall new and/or daily information with [place] verbal and questions cues  Skilled Therapeutic Interventions:Skilled ST services focused on cognitive skills. Pt demonstrated recall of incentive spirometer instructions with supervision A verbal cues and preformed x10 to level 500 and x2 to level 750 with breaks for rest and SLP assistance in holding device. SLP facilitated basic problem solving skills utilizing 3 step pictures cards in sequencing task, pt required mod A verbal cues and sustained attention in 15 minute intervals with min A verbal cues. Pt demonstrated ability to list 2 physical and 2 cognitive deficits with min A verbal cues and awareness of need for increased assistance at discharge. SLP downgraded LTG in problem solving attention and awareness, due to limited progress. Pt was left in room with call bell within reach and bed alarm set. ST recommends to continue skilled ST services.      Pain Pain Assessment Pain Score: 0-No pain  Therapy/Group: Individual Therapy  Shahiem Bedwell  [facility] 05/15/2019, 2:55 PM

## 2019-05-16 ENCOUNTER — Inpatient Hospital Stay (HOSPITAL_COMMUNITY): Payer: Medicare Other

## 2019-05-16 ENCOUNTER — Inpatient Hospital Stay (HOSPITAL_COMMUNITY): Payer: Medicare Other | Admitting: Speech Pathology

## 2019-05-16 ENCOUNTER — Inpatient Hospital Stay (HOSPITAL_COMMUNITY): Payer: Medicare Other | Admitting: Occupational Therapy

## 2019-05-16 NOTE — Progress Notes (Signed)
Occupational Therapy Session Note  Patient Details  Name: [name] MRN: [id number] Date of Birth: [birth year]  Today's Date: 05/16/2019 OT Individual Time: 6314-9702 OT Individual Time Calculation (min): 75 min    Skilled Therapeutic Interventions/Progress Updates:    Pt worked on transfer from supine to sit EOB with max assist overall.  Max assist was then needed for donning pants sit to stand from the EOB.  She was able to then complete stand pivot transfer to the wheelchair with overall max assist using the RW for support.  Once in the wheelchair, she worked on oral hygiene at the sink.  She was able to use the LUE spontaneously with increased time to remove and place the toothpaste cap, brushing her teeth with the RUE.  Currently, she has a thumb spica splint for wrist pain on the RUE, but is unable to wear this and grip the toothbrush.  Will discuss with MD to determine if this was the type of splint he was wanting vs a wrist cock up splint.  Finished session with functional mobility with max assist using the RW for support.  She was able to ambulated approximately 10 ft.  Finished session with pt up in the wheelchair with call button and phone in reach and safety alarm belt in place.    Therapy Documentation Precautions:  Precautions Precautions: Fall Precaution Comments: O2 dependent Restrictions Weight Bearing Restrictions: No  Pain: Pain Assessment Pain Scale: Faces Faces Pain Scale: Hurts little more Pain Type: Acute pain Pain Location: Arm Pain Orientation: Right Pain Descriptors / Indicators: Discomfort;Radiating Pain Onset: With Activity Pain Intervention(s): Splinting;Repositioned;Emotional support ADL: See Care Tool Section for some details of ADL  Therapy/Group: Individual Therapy  Alanta Scobey OTR/L 05/16/2019, 3:45 PM

## 2019-05-16 NOTE — Progress Notes (Signed)
[place] PHYSICAL MEDICINE & REHABILITATION PROGRESS NOTE   Subjective/Complaints: Wrist pain right side.  Reviewed x-ray results.  Urate is normal   ROS:neg CP, SOB, N/V/D  Objective:   Dg Wrist Complete Right  Result Date: 05/15/2019 CLINICAL DATA:  Pain EXAM: RIGHT WRIST - COMPLETE 3+ VIEW COMPARISON:  None. FINDINGS: There is no displaced fracture. No dislocation. There are advanced degenerative changes of the first carpometacarpal joint as well as the radiocarpal joint. Cystic changes are noted in the scaphoid, presumably degenerative in etiology. There is no radiopaque foreign body. There is a slight lucency through the waist of the scaphoid, likely artifactual. IMPRESSION: 1. No acute displaced fracture or dislocation. 2. Advanced degenerative changes of the first carpometacarpal joint. Electronically Signed   By: Constance Holster M.D.   On: 05/15/2019 15:26   Recent Labs    05/15/19 0629 05/15/19 1040  WBC 12.4* 10.9*  HGB 7.0* 7.4*  HCT 22.9* 24.5*  PLT 548* 552*   Recent Labs    05/15/19 0629  NA 135  K 4.3  CL 95*  CO2 33*  GLUCOSE 126*  BUN 29*  CREATININE 0.85  CALCIUM 8.5*    Intake/Output Summary (Last 24 hours) at 05/16/2019 0746 Last data filed at 05/15/2019 1840 Gross per 24 hour  Intake 120 ml  Output -  Net 120 ml     Physical Exam: Vital Signs Blood pressure (!) 122/51, pulse 80, temperature 97.9 F (36.6 C), temperature source Oral, resp. rate 17, height 4\' 9"  (1.448 m), weight 61 kg, SpO2 96 %.   Constitutional: No distress . Vital signs reviewed. HEENT: EOMI, oral membranes moist Neck: supple Cardiovascular: RRR without murmur. No JVD    Respiratory: CTA Bilaterally without wheezes or rales. Normal effort    GI: BS +, non-tender, non-distended  Extremities: No clubbing, cyanosis, or edema Skin: No evidence of breakdown, no evidence of rash Neurologic: alert, still distracted however, tangential.  Cranial nerves II through XII  intact, motor strength is 5/5 in right deltoid, bicep, tricep, grip, hip flexor, knee extensors, ankle dorsiflexor and plantar flexor 3- Left delt bi tri, 3- grip, 2- L hip/knee ext synergy--stable Sensory exam normal sensation to light touch and proprioception in bilateral upper and lower extremities--unchanged  Musculoskeletal: RUE Pain to palpation in parasternal area on Left side as well as Left costal margin Psych:sl anxious  Assessment/Plan: 1. Functional deficits secondary to RIght pontine infarct which require 3+ hours per day of interdisciplinary therapy in a comprehensive inpatient rehab setting.  Physiatrist is providing close team supervision and 24 hour management of active medical problems listed below.  Physiatrist and rehab team continue to assess barriers to discharge/monitor patient progress toward functional and medical goals  Care Tool:  Bathing    Body parts bathed by patient: Chest, Abdomen, Right upper leg, Left upper leg, Face, Right arm   Body parts bathed by helper: Left arm, Front perineal area, Buttocks, Left lower leg, Right lower leg     Bathing assist Assist Level: Maximal Assistance - Patient 24 - 49%     Upper Body Dressing/Undressing Upper body dressing   What is the patient wearing?: Pull over shirt    Upper body assist Assist Level: Maximal Assistance - Patient 25 - 49%    Lower Body Dressing/Undressing Lower body dressing      What is the patient wearing?: Pants, Incontinence brief     Lower body assist Assist for lower body dressing: Maximal Assistance - Patient 25 - 49%  Toileting Toileting    Toileting assist Assist for toileting: Maximal Assistance - Patient 25 - 49%     Transfers Chair/bed transfer  Transfers assist  Chair/bed transfer activity did not occur: (squat pivot/lateral scoot)  Chair/bed transfer assist level: Dependent - mechanical lift(Stedy from the shower)     Locomotion Ambulation   Ambulation  assist   Ambulation activity did not occur: Safety/medical concerns  Assist level: 2 helpers Assistive device: Walker-rolling Max distance: 15 ft   Walk 10 feet activity   Assist  Walk 10 feet activity did not occur: Safety/medical concerns  Assist level: 2 helpers Assistive device: Walker-rolling   Walk 50 feet activity   Assist Walk 50 feet with 2 turns activity did not occur: Safety/medical concerns         Walk 150 feet activity   Assist Walk 150 feet activity did not occur: Safety/medical concerns         Walk 10 feet on uneven surface  activity   Assist Walk 10 feet on uneven surfaces activity did not occur: Safety/medical concerns         Wheelchair     Assist Will patient use wheelchair at discharge?: Yes Type of Wheelchair: Manual    Wheelchair assist level: Dependent - Patient 0% Max wheelchair distance: 150 ft    Wheelchair 50 feet with 2 turns activity    Assist        Assist Level: Dependent - Patient 0%   Wheelchair 150 feet activity     Assist     Assist Level: Dependent - Patient 0%    Medical Problem List and Plan: 1.Functional deficits and left hemiparesissecondary to right pontine infarct on 05/01/2019 CIR PT, OT, SLP, team conference in a.m. 2. Antithrombotics: -DVT/anticoagulation:Pharmaceutical:Lovenox -antiplatelet therapy: ASA/Plavix X 3 weeks followed by ASA. 3.Headaches/Chronic back pain/Pain Management:Voltaren gel for hands, increased to 4 times daily -Gabapentin  100 mg HS. -  tramadol prn with local measuresincluding heat -Lidocain patches/heat/ice.Oxycodone not daily use per records/PMP aware reviewed.     4. Mood:LCSW to follow for evaluation and support. -antipsychotic agents: N/A 5. Neuropsych: This patientis notcapable of making decisions onherown behalf.   -some improvement  -probably underlying  cognitive deficits at baseline  -neuropsych to see  -continue to support pt at bedside  6. Skin/Wound Care:LCSW tio follow for evaluation and support. 7. Fluids/Electrolytes/Nutrition:Monitor I/O.  D3 thins.  8. HTN: Monitor BP bid--permissive HTN and normalize in 5-7 days.  9. COPD./chronic hypoxemic respiratory failure: Question maintainence medications. Oxygen dependent.added prn neb -OOB, IS 10. Pre renal azotemia resolved with hydration  Intake only 120 cc recorded yesterday  Repeat BUN creatinine normal, GFR normal 11. H/o depression: Continue cymbalta.  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  13. H/o B12 deficiency/Iron deficiency anemia: Hgb 8.4 at admission with mild leucocytosis.   Added iron supplement.  -most recent hgb 8.1, continue with B12 supplementation We will check fecal occult blood- still pending  transfused 1 U with expected improvement in Hgb Dropped again, but recheck 7.4 versus 7.0  -B12 supp 14. Dyslipidemia: Now on Lipitor.  15.  Episode of CP normal EKG, exam does show tenderness in the Left parasternal area, troponins neg, CXR showing expected atelectasis Likely MSK-Resolved 16.  RIght wrist synovitis will check Xray and serum urate-urate negative x-rays show advanced osteoarthritis will splint and use Voltaren gel  LOS: 14 days A FACE TO [place] E Coline Calkin 05/16/2019, 7:46 AM

## 2019-05-16 NOTE — Progress Notes (Signed)
Speech Language Pathology Daily Session Note  Patient Details  Name: [name] MRN: [id number] Date of Birth: August 15, 1936  Today's Date: 05/16/2019 SLP Individual Time: 0930-1030 SLP Individual Time Calculation (min): 60 min  Short Term Goals: Week 2: SLP Short Term Goal 1 (Week 2): Patient will demonstrate intellectual awareness and identify 2 physical and 2 cognitive deficits with Min A multimodal cues.  SLP Short Term Goal 2 (Week 2): Patient will demonstrate functional problem solving for basic tasks with Mod A multimodal cues.  SLP Short Term Goal 3 (Week 2): Patient will demonstrate sustained attention to functional tasks for 10 minutes with Min A verbal cues for redirection.  SLP Short Term Goal 4 (Week 2): Pt will utilize compensatory memory aids to recall new and/or daily information with ModA verbal and questions cues  Skilled Therapeutic Interventions:  Skilled treatment session focused on cognition goals. SLP facilitated session by providing Mod A cues to follow directions for bed mobility to aid in peri-care and removal of bedpan. Pt perseverative multiple topics regarding care while on unit, support and encouragement given. Pt continues to talk about depression and excitement about some progress with PT (have not checked chart for correct perception of progress). With Min A cues, pt able to recall correct orientation information. Pt left upright in bed, bed alarm on and all needs within reach. Continue per current plan of care.      Pain    Therapy/Group: Individual Therapy  Eldean Klatt 05/16/2019, 11:53 AM

## 2019-05-16 NOTE — Progress Notes (Signed)
Physical Therapy Session Note  Patient Details  Name: [name] MRN: [id number] Date of Birth: [birth year]  Today's Date: 05/16/2019 PT Individual Time: 0413-6438 PT Individual Time Calculation (min): 58 min   Short Term Goals: Week 2:  PT Short Term Goal 1 (Week 2): Pt will ambulated x 10 ft with mod assist and RW PT Short Term Goal 2 (Week 2): Pt will perform bed mobility bed mobility  PT Short Term Goal 3 (Week 2): Pt will perform car transfer with mod assist  Skilled Therapeutic Interventions/Progress Updates:    Pt seated in w/c upon PT arrival, agreeable to therapy tx and denies pain at rest. Pt transported to the gym. Pt performed sit<>stand x 2 this session with RW and min-mod assist, while in standing pt worked on lateral weightshifting in place and stepping feet in place for pre-gait activities. Pt ambulated 2 x 13 ft this session with RW and mod assist, cues for upright posture and RW management, increased cues for foot placement during turns. Pt worked on standing balance this session with single UE support on RW while performing card matching activity x2.5 minutes, min assist for balance. Pt worked on standing balance with RW and min assist to perform puzzle x3.5 minutes before needing a break, cues for upright posture. Pt transported back to room and transferred to toilet with the stedy. Sit<>stand within the stedy min assist, therapist assisted with clothing management. Pt left on the toilet in care of the NT (tashia).   Therapy Documentation Precautions:  Precautions Precautions: Fall Precaution Comments: O2 dependent Restrictions Weight Bearing Restrictions: No    Therapy/Group: Individual Therapy  Netta Corrigan, PT, DPT 05/16/2019, 12:43 PM

## 2019-05-16 NOTE — Progress Notes (Signed)
Orthopedic Tech Progress Note Patient Details:  [name] 11/15/1936 [id number]  Ortho Devices Type of Ortho Device: Wrist splint, Velcro wrist splint Ortho Device/Splint Location: URE Ortho Device/Splint Interventions: Adjustment, Application, Ordered   Post Interventions Patient Tolerated: Well Instructions Provided: Care of device, Adjustment of device   Janit Pagan 05/16/2019, 10:56 AM

## 2019-05-17 ENCOUNTER — Inpatient Hospital Stay (HOSPITAL_COMMUNITY): Payer: Medicare Other

## 2019-05-17 ENCOUNTER — Inpatient Hospital Stay (HOSPITAL_COMMUNITY): Payer: Medicare Other | Admitting: Occupational Therapy

## 2019-05-17 NOTE — Progress Notes (Signed)
[place] PHYSICAL MEDICINE & REHABILITATION PROGRESS NOTE   Subjective/Complaints: Wrist pain better with splint but unable to grasp with spica   ROS:neg CP, SOB, N/V/D  Objective:   Dg Wrist Complete Right  Result Date: 05/15/2019 CLINICAL DATA:  Pain EXAM: RIGHT WRIST - COMPLETE 3+ VIEW COMPARISON:  None. FINDINGS: There is no displaced fracture. No dislocation. There are advanced degenerative changes of the first carpometacarpal joint as well as the radiocarpal joint. Cystic changes are noted in the scaphoid, presumably degenerative in etiology. There is no radiopaque foreign body. There is a slight lucency through the waist of the scaphoid, likely artifactual. IMPRESSION: 1. No acute displaced fracture or dislocation. 2. Advanced degenerative changes of the first carpometacarpal joint. Electronically Signed   By: Constance Holster M.D.   On: 05/15/2019 15:26   Recent Labs    05/15/19 0629 05/15/19 1040  WBC 12.4* 10.9*  HGB 7.0* 7.4*  HCT 22.9* 24.5*  PLT 548* 552*   Recent Labs    05/15/19 0629  NA 135  K 4.3  CL 95*  CO2 33*  GLUCOSE 126*  BUN 29*  CREATININE 0.85  CALCIUM 8.5*    Intake/Output Summary (Last 24 hours) at 05/17/2019 1058 Last data filed at 05/17/2019 0959 Gross per 24 hour  Intake 564 ml  Output -  Net 564 ml     Physical Exam: Vital Signs Blood pressure 134/60, pulse 100, temperature 98.9 F (37.2 C), resp. rate 18, height '4\' 9"'$  (1.448 m), weight 61 kg, SpO2 93 %.   Constitutional: No distress . Vital signs reviewed. HEENT: EOMI, oral membranes moist Neck: supple Cardiovascular: RRR without murmur. No JVD    Respiratory: CTA Bilaterally without wheezes or rales. Normal effort    GI: BS +, non-tender, non-distended  Extremities: No clubbing, cyanosis, or edema Skin: No evidence of breakdown, no evidence of rash Neurologic: alert, still distracted however, tangential.  Cranial nerves II through XII intact, motor strength is 5/5 in  right deltoid, bicep, tricep, grip, hip flexor, knee extensors, ankle dorsiflexor and plantar flexor 3- Left delt bi tri, 3- grip, 2- L hip/knee ext synergy--stable Sensory exam normal sensation to light touch and proprioception in bilateral upper and lower extremities--unchanged  Musculoskeletal: RUE pain over dorsal RIght wrist minimal effusion Psych:sl anxious  Assessment/Plan: 1. Functional deficits secondary to RIght pontine infarct which require 3+ hours per day of interdisciplinary therapy in a comprehensive inpatient rehab setting.  Physiatrist is providing close team supervision and 24 hour management of active medical problems listed below.  Physiatrist and rehab team continue to assess barriers to discharge/monitor patient progress toward functional and medical goals  Care Tool:  Bathing    Body parts bathed by patient: Chest, Abdomen, Right upper leg, Left upper leg, Face, Right arm   Body parts bathed by helper: Left arm, Front perineal area, Buttocks, Left lower leg, Right lower leg     Bathing assist Assist Level: Maximal Assistance - Patient 24 - 49%     Upper Body Dressing/Undressing Upper body dressing   What is the patient wearing?: Pull over shirt    Upper body assist Assist Level: Maximal Assistance - Patient 25 - 49%    Lower Body Dressing/Undressing Lower body dressing      What is the patient wearing?: Pants     Lower body assist Assist for lower body dressing: Maximal Assistance - Patient 25 - 49%     Toileting Toileting    Toileting assist Assist for toileting: Maximal  Assistance - Patient 25 - 49%     Transfers Chair/bed transfer  Transfers assist  Chair/bed transfer activity did not occur: (squat pivot/lateral scoot)  Chair/bed transfer assist level: Moderate Assistance - Patient 50 - 74%     Locomotion Ambulation   Ambulation assist   Ambulation activity did not occur: Safety/medical concerns  Assist level: Moderate  Assistance - Patient 50 - 74% Assistive device: Walker-rolling Max distance: 12 ft   Walk 10 feet activity   Assist  Walk 10 feet activity did not occur: Safety/medical concerns  Assist level: Moderate Assistance - Patient - 50 - 74% Assistive device: Walker-rolling   Walk 50 feet activity   Assist Walk 50 feet with 2 turns activity did not occur: Safety/medical concerns         Walk 150 feet activity   Assist Walk 150 feet activity did not occur: Safety/medical concerns         Walk 10 feet on uneven surface  activity   Assist Walk 10 feet on uneven surfaces activity did not occur: Safety/medical concerns         Wheelchair     Assist Will patient use wheelchair at discharge?: Yes Type of Wheelchair: Manual    Wheelchair assist level: Dependent - Patient 0% Max wheelchair distance: 150 ft    Wheelchair 50 feet with 2 turns activity    Assist        Assist Level: Dependent - Patient 0%   Wheelchair 150 feet activity     Assist     Assist Level: Dependent - Patient 0%    Medical Problem List and Plan: 1.Functional deficits and left hemiparesissecondary to right pontine infarct on 05/01/2019 CIR PT, OT, SLP,Team conference today please see physician documentation under team conference tab, met with team face-to-face to discuss problems,progress, and goals. Formulized individual treatment plan based on medical history, underlying problem and comorbidities. 2. Antithrombotics: -DVT/anticoagulation:Pharmaceutical:Lovenox -antiplatelet therapy: ASA/Plavix X 3 weeks followed by ASA. 3.Headaches/Chronic back pain/Pain Management:Voltaren gel for hands, increased to 4 times daily -Gabapentin  100 mg HS. -  tramadol prn with local measuresincluding heat -Lidocain patches/heat/ice.Oxycodone not daily use per records/PMP aware reviewed.     4. Mood:LCSW to follow for  evaluation and support. -antipsychotic agents: N/A 5. Neuropsych: This patientis notcapable of making decisions onherown behalf.   -some improvement  -probably underlying cognitive deficits at baseline  -neuropsych to see  -continue to support pt at bedside  6. Skin/Wound Care:LCSW tio follow for evaluation and support. 7. Fluids/Electrolytes/Nutrition:Monitor I/O.  D3 thins.  8. HTN: Monitor BP bid--permissive HTN and normalize in 5-7 days.  9. COPD./chronic hypoxemic respiratory failure: Question maintainence medications. Oxygen dependent.added prn neb -OOB, IS 10. Pre renal azotemia resolved with hydration  Intake only 120 cc recorded yesterday  Repeat BUN creatinine normal, GFR normal 11. H/o depression: Continue cymbalta.  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  13. H/o B12 deficiency/Iron deficiency anemia: Hgb 8.4 at admission with mild leucocytosis.   Added iron supplement.  -most recent hgb 8.1, continue with B12 supplementation We will check fecal occult blood- still pending  transfused 1 U with expected improvement in Hgb  recheck 7.4   -B12 supp 14. Dyslipidemia: Now on Lipitor.  15.  Episode of CP normal EKG, exam does show tenderness in the Left parasternal area, troponins neg, CXR showing expected atelectasis Likely MSK-Resolved 16.  RIght wrist synovitis will check Xray and serum urate-urate negative x-rays show advanced osteoarthritis will splint  and use Voltaren gel Change spica to wrist orthosis to allow for increased functional use of RUE  LOS: 15 days A FACE TO [place] E Kirsteins 05/17/2019, 10:58 AM

## 2019-05-17 NOTE — Progress Notes (Signed)
Speech Language Pathology Weekly Progress and Session Note  Patient Details  Name: [name] MRN: [id number] Date of Birth: [birth year]  Beginning of progress report period: May 10, 2019 End of progress report period: May 17, 2019  Today's Date: 05/17/2019 SLP Individual Time: 7371-0626 SLP Individual Time Calculation (min): 43 min  Short Term Goals: Week 2: SLP Short Term Goal 1 (Week 2): Patient will demonstrate intellectual awareness and identify 2 physical and 2 cognitive deficits with Min A multimodal cues.  SLP Short Term Goal 1 - Progress (Week 2): Met SLP Short Term Goal 2 (Week 2): Patient will demonstrate functional problem solving for basic tasks with Mod A multimodal cues.  SLP Short Term Goal 2 - Progress (Week 2): Met SLP Short Term Goal 3 (Week 2): Patient will demonstrate sustained attention to functional tasks for 10 minutes with Min A verbal cues for redirection.  SLP Short Term Goal 3 - Progress (Week 2): Met SLP Short Term Goal 4 (Week 2): Pt will utilize compensatory memory aids to recall new and/or daily information with ModA verbal and questions cues SLP Short Term Goal 4 - Progress (Week 2): Met    New Short Term Goals: Week 3: SLP Short Term Goal 1 (Week 3): Patient will demonstrate intellectual awareness and identify 2 physical and 2 cognitive deficits with supervision A multimodal cues.  SLP Short Term Goal 2 (Week 3): Patient will demonstrate functional problem solving for basic tasks with Min A multimodal cues.  SLP Short Term Goal 3 (Week 3): Patient will demonstrate sustained attention to functional tasks for 20 minutes with Min A verbal cues for redirection.  SLP Short Term Goal 4 (Week 3): Pt will utilize compensatory memory aids to recall new and/or daily information with Min A verbal and questions cues SLP Short Term Goal 5 (Week 3): Pt will consume dys 3 and thin diet with mod I use of swallow strategies.  Weekly Progress Updates: Pt made great  progress meeting 4 out 4 STGs, however LTG were downgraded in the areas of functional problem solving, attention and awareness due to slow progress. Pt demonstrated increase in recall of functional information, however the use of visual aids, such a memory notebook were not successful at this time without reading glasses present. Pt does support relaying on son to recall important information and does not use an external aid system at home. SLP recommends use of external aids/visual aid at home when reading glasses are available. Pt demonstrated increased sustained attention and ability to control verbose  distracting behavior for a short period of time. Pt is currently consuming dys 3 and thin diet, SLP will continue to focus on safe PO consumption with trials of regular textures is appropriate. Pt would continue to benefit from skilled ST services in order to maximize functional independence and reduce burden of care, requiring 24 hour supervision and continue ST services.     Intensity: Minumum of 1-2 x/day, 30 to 90 minutes Frequency: 3 to 5 out of 7 days Duration/Length of Stay: 5/27 Treatment/Interventions: Cognitive remediation/compensation;Environmental controls;Internal/external aids;Speech/Language facilitation;Therapeutic Activities;Patient/family education;Functional tasks;Cueing hierarchy   Daily Session  Skilled Therapeutic Interventions:  Skilled ST services focused on swallow and cognitive skills. SLP facilitated PO consumption trials of regular textured foods, pt demonstrated prolonged mastication and x1 delayed cough, pt reported due to "throat being scratchy." Pt required supervision A verbal cues to cease mastication during PO consumption of dys 3 textures. Pt reported TV talking to her last night and perseverated on  topic for a few minutes Pt required mod A verbal cues to match therapist names with occupation. Pt was left in room with call bell within reach and bed alarm set. ST  recommends to continue skilled ST services.    General    Pain Pain Assessment Pain Score: 0-No pain  Therapy/Group: Individual Therapy  Hridhaan Yohn  [facility] 05/17/2019, 4:50 PM

## 2019-05-17 NOTE — Progress Notes (Signed)
Physical Therapy Weekly Progress Note  Patient Details  Name: [name] MRN: [id number] Date of Birth: Aug 10, 1936  Beginning of progress report period: May 10, 2019 End of progress report period: May 17, 2019  Today's Date: 05/17/2019 PT Individual Time: 3716-9678 PT Individual Time Calculation (min): 56 min   Patient has met 3 of 3 short term goals. Pt is progressing with all functional mobility and demonstrated improved activity tolerance and endurance this week. Pt has progressed with ambulation  And is able to ambulate 10-15 ft with RW and mod assist. Pt demonstrates improved dynamic standing balance while performing functional tasks in standing with single UE support on RW.   Patient continues to demonstrate the following deficits muscle weakness, decreased coordination and decreased motor planning, decreased attention and decreased memory and decreased standing balance, decreased postural control and decreased balance strategies and therefore will continue to benefit from skilled PT intervention to increase functional independence with mobility.  Patient progressing toward long term goals..  Continue plan of care.  PT Short Term Goals Week 2:  PT Short Term Goal 1 (Week 2): Pt will ambulated x 10 ft with mod assist and RW PT Short Term Goal 1 - Progress (Week 2): Met PT Short Term Goal 2 (Week 2): Pt will perform bed mobility bed mobility  PT Short Term Goal 2 - Progress (Week 2): Met PT Short Term Goal 3 (Week 2): Pt will perform car transfer with mod assist PT Short Term Goal 3 - Progress (Week 2): Met Week 3:  PT Short Term Goal 1 (Week 3): STG=LTG due to ELOS  Skilled Therapeutic Interventions/Progress Updates:    Pt supine in bed upon PT arrival, agreeable to therapy tx and denies pain. Pt transferred to sitting EOB with mod assist and cues for techniques, hand placement. Pt transferred to w/c with RW and mod assist, cues for techniques and manual facilitation for  weightshifting. Pt ambulated 2 x 15 ft this session with RW and mod assist, cues for upright posture and RW management, manual facilitation for R lateral weightshift and L swing phase, increased cues for foot placement during turns. Pt performed sit<>stand from mat with min assist and RW. Pt worked on dynamic standing balance with single UE support on RW this session to perform bean bag toss activity, x 2 trials with min assist for standing balance. Pt performed x 5 sit<>stands with RW and min assist from the mat working on LE strengthening, cues for anterior weightshift. Pt ambulated x 6 ft back to w/c with RW and mod assist, transported back to room. Pt transferred w/c<>toilet this session with stedy, total assist for pericare and clothing management. Pt both continent and incontinent of bladder, continent of bowel. Sitting in stedy to wash hands. Pt left in w/c at end of session with needs in reach and chair alarm set.   Therapy Documentation Precautions:  Precautions Precautions: Fall Precaution Comments: O2 dependent Restrictions Weight Bearing Restrictions: No   Therapy/Group: Individual Therapy  Netta Corrigan, PT, DPT 05/17/2019, 7:58 AM

## 2019-05-17 NOTE — Progress Notes (Signed)
Occupational Therapy Weekly Progress Note  Patient Details  Name: [name] MRN: [id number] Date of Birth: 1936/04/01  Beginning of progress report period: May 11, 2019 End of progress report period: May 17, 2019  Today's Date: 05/17/2019 OT Individual Time: 0901-1003 OT Individual Time Calculation (min): 62 min    Patient has met 2 of 5 short term goals.  Pt has made slow progress over the past week with OT with regards to selfcare tasks.  She continues to report increased pain in the right wrist and shoulder secondary to arthritis as well as demonstrating Brunnstrum stage IV-V movement in the left arm and hand.  She needs max assist for UB bathing and dressing secondary to limited functional use and reach in both arms to wash the opposite shoulder and underarm.  She needs max assist for washing under her breasts and drying them as well.  Max facilitation is needed for donning a pullover shirt.   LB bathing and dressing are currently max assist sit to stand, however she is able to demonstrate sit to stand with mod facilitation.  Feel overall she will continue to make slower progress than originally expected secondary to her age, premorbid activity level, and arthritis.  Goals have been downgraded to mod assist level overall and may need further adjustment depending on the next few days.  Will plan for family education early next week with her son to see if he can provide the mod to max assist that will be needed for home.  Recommend continued OT at CIR level at this time.    Patient continues to demonstrate the following deficits: muscle weakness, decreased cardiorespiratoy endurance, impaired timing and sequencing, unbalanced muscle activation and decreased coordination, decreased attention, decreased awareness and decreased memory and decreased sitting balance, decreased standing balance, decreased postural control, hemiplegia and decreased balance strategies and therefore will continue to  benefit from skilled OT intervention to enhance overall performance with BADL and Vocation.  Patient progressing toward long term goals..  Continue plan of care.  OT Short Term Goals Week 3:  OT Short Term Goal 1 (Week 3): Continue working on established min to mod assist level long term goals.   Skilled Therapeutic Interventions/Progress Updates:    Pt worked on supine to sit EOB with max assist.  She then stated urgency for needing to go to the bathroom, so [name] was utilized for transfer.  Mod assist for sit to stand with the Stedy in order for pt to be transported to the 3:1 over the toilet.  Max assist for toilet hygiene and clothing management.  She then transferred from the [facility] to the wheelchair with mod assist stand pivot with use of the RW.  Bathing and dressing was then completed sit to stand at the sink.  Max assist for washing her UB with mod hand over hand with LUE functional use and min assist for RUE use.  She needed max assist for washing and drying under her breasts thoroughly.  Max assist for washing her lower legs, buttocks, and peri area sit to stand.  She also needed max assist for donning LB clothing as well.  Finished session with pt in the wheelchair at the sink with NT present to help her complete oral hygiene.    Therapy Documentation Precautions:  Precautions Precautions: Fall Precaution Comments: O2 dependent Restrictions Weight Bearing Restrictions: No General:   Vital Signs:   Pain: Pain Assessment Pain Scale: Faces Faces Pain Scale: Hurts a little bit Pain Type: Chronic  pain Pain Location: Hand Pain Orientation: Right Pain Descriptors / Indicators: Discomfort Pain Onset: With Activity Pain Intervention(s): Repositioned ADL: See Care Tool Section for some details of ADL  Therapy/Group: Individual Therapy  Ona Rathert OTR/L 05/17/2019, 12:16 PM

## 2019-05-18 ENCOUNTER — Inpatient Hospital Stay (HOSPITAL_COMMUNITY): Payer: Medicare Other | Admitting: Physical Therapy

## 2019-05-18 ENCOUNTER — Inpatient Hospital Stay (HOSPITAL_COMMUNITY): Payer: Medicare Other | Admitting: Speech Pathology

## 2019-05-18 ENCOUNTER — Inpatient Hospital Stay (HOSPITAL_COMMUNITY): Payer: Medicare Other | Admitting: Occupational Therapy

## 2019-05-18 MED ORDER — SENNOSIDES-DOCUSATE SODIUM 8.6-50 MG PO TABS
2.0000 | ORAL_TABLET | Freq: Two times a day (BID) | ORAL | Status: DC
  Administered 2019-05-18 – 2019-05-26 (×16): 2 via ORAL
  Filled 2019-05-18 (×18): qty 2

## 2019-05-18 MED ORDER — ACETAMINOPHEN 325 MG PO TABS
650.0000 mg | ORAL_TABLET | Freq: Three times a day (TID) | ORAL | Status: DC
  Administered 2019-05-18 – 2019-05-26 (×20): 650 mg via ORAL
  Filled 2019-05-18 (×21): qty 2

## 2019-05-18 MED ORDER — CLOPIDOGREL BISULFATE 75 MG PO TABS: 75.0000 mg | ORAL_TABLET | Freq: Every day | ORAL | Status: DC

## 2019-05-18 MED ORDER — CLOPIDOGREL BISULFATE 75 MG PO TABS
75.0000 mg | ORAL_TABLET | Freq: Every day | ORAL | Status: AC
  Administered 2019-05-18 – 2019-05-22 (×5): 75 mg via ORAL
  Filled 2019-05-18 (×5): qty 1

## 2019-05-18 NOTE — Progress Notes (Signed)
Occupational Therapy Session Note  Patient Details  Name: [name] MRN: [id number] Date of Birth: 1936/03/17  Today's Date: 05/18/2019 OT Individual Time: 9449-6759 OT Individual Time Calculation (min): 57 min    Short Term Goals: Week 3:  OT Short Term Goal 1 (Week 3): Continue working on established min to mod assist level long term goals.   Skilled Therapeutic Interventions/Progress Updates:    Pt completed supine to sit EOB with max assist during session.  Once sitting, she completed sit to stand with mod assist using the RW for support and ambulated to the toilet with max assist.  Max assist for toilet hygiene and clothing management, as well as donning new brief.  She needed mod assist for ambulation back out to the wheelchair at the sink for grooming tasks.  She was able to complete opening of the toothpaste using the LUE and needed removal of the RUE hand splint to brush her teeth.  Finished session with pt up in the wheelchair with call button and phone in reach and safety belt in place.    Therapy Documentation Precautions:  Precautions Precautions: Fall Precaution Comments: O2 dependent Restrictions Weight Bearing Restrictions: No  Pain: Pain Assessment Pain Scale: Faces Pain Score: 0-No pain Faces Pain Scale: Hurts a little bit Pain Type: Chronic pain Pain Location: Hand Pain Orientation: Right Pain Descriptors / Indicators: Grimacing;Discomfort Pain Onset: With Activity Pain Intervention(s): Repositioned ADL: See Care Tool Section for some details of ADLs  Therapy/Group: Individual Therapy  Lavren Lewan OTR/L 05/18/2019, 12:09 PM

## 2019-05-18 NOTE — Progress Notes (Signed)
Physical Therapy Session Note  Patient Details  Name: [name] MRN: [id number] Date of Birth: September 18, 1936  Today's Date: 05/18/2019 PT Individual Time: 6962-9528 PT Individual Time Calculation (min): 60 min   Short Term Goals: Week 3:  PT Short Term Goal 1 (Week 3): STG=LTG due to ELOS  Skilled Therapeutic Interventions/Progress Updates:   Pt received supine in bed and agreeable to therapy session. Pt maintained on 2L of O2 via nasal cannula throughout session. Supine>sit HOB elevated and using bedrails with mod assist for B LE management and trunk upright. Sit<>stand from EOB/w/c/EOM<>RW with min assist progressed to CGA for balance throughout session. Stand pivot EOB>w/c using RW with min assist for balance. Transported in w/c to/from therapy gym. Ambulated 99ft x2 using RW with min assist for balance throughout - on 2nd ambulation pt demonstrating increased unsteadiness due to fear of falling - After 1st walk: SpO2 97% and HR 105bpm decreasing to 82bpm - After 2nd walk: SpO2 99% and HR 118bpm decreasing to 87bpm. Pt reporting she feels that she has urinated. Transported back to room. Stand pivot w/c<>BSC over toilet using RW with min assist for balance. Pt able to maintain standing balance with B UE support on RW for total assist LB clothing management. Pt incontinent of bladder in brief and continent of bladder and bowels on toilet. Sit<>stand BSC over toilet<>RW with min assist for lifting/lowering and balance. Pt performed standing peri-care with CGA for balance and therapist assisted for cleanliness. Stand pivot w/c>EOB with B HHA and min assist for balance. Sit>supine with mod assist for trunk descent and B LE management. Pt left supine in bed with needs in reach, bed alarm on, and incentive spirometry in pt's hand to perform with nursing staff present.   Therapy Documentation Precautions:  Precautions Precautions: Fall Precaution Comments: O2 dependent Restrictions Weight Bearing  Restrictions: No  Pain: No reports of pain during session.   Therapy/Group: Individual Therapy  Tawana Scale, PT, DPT 05/18/2019, 4:24 PM

## 2019-05-18 NOTE — Plan of Care (Signed)
  Problem: Consults Goal: RH STROKE PATIENT EDUCATION Description See Patient Education module for education specifics  Outcome: Progressing Goal: Nutrition Consult-if indicated Outcome: Progressing   Problem: RH BOWEL ELIMINATION Goal: RH STG MANAGE BOWEL WITH ASSISTANCE Description STG Manage Bowel with Max Assistance.  Outcome: Progressing Goal: RH STG MANAGE BOWEL W/MEDICATION W/ASSISTANCE Description STG Manage Bowel with Medication with max Assistance.  Outcome: Progressing   Problem: RH BLADDER ELIMINATION Goal: RH STG MANAGE BLADDER WITH ASSISTANCE Description STG Manage Bladder With max Assistance  Outcome: Progressing   Problem: RH SKIN INTEGRITY Goal: RH STG SKIN FREE OF INFECTION/BREAKDOWN Description Skin to remain free from breakdown while on rehab with max assist.  Outcome: Progressing Goal: RH STG MAINTAIN SKIN INTEGRITY WITH ASSISTANCE Description STG Maintain Skin Integrity With max Assistance and following following a turning and rotation schedule.  Outcome: Progressing Goal: RH STG ABLE TO PERFORM INCISION/WOUND CARE W/ASSISTANCE Description STG Able To Perform Wound/Skin breakdown Care With max Assistance.  Outcome: Progressing   Problem: RH SAFETY Goal: RH STG ADHERE TO SAFETY PRECAUTIONS W/ASSISTANCE/DEVICE Description STG Adhere to Safety Precautions With max Assistance and using appropriate safety and assistive Devices.  Outcome: Progressing   Problem: RH PAIN MANAGEMENT Goal: RH STG PAIN MANAGED AT OR BELOW PT'S PAIN GOAL Description <4 on a 0-10 pain scale.  Outcome: Progressing   Problem: RH KNOWLEDGE DEFICIT Goal: RH STG INCREASE KNOWLEDGE OF HYPERTENSION Description Patient to demonstrate knowledge on HTN medications, BP parameters, and follow-up care with the MD post discharge with mod assist from rehab staff.  Outcome: Progressing Goal: RH STG INCREASE KNOWLEDGE OF STROKE PROPHYLAXIS Description Patient to demonstrate knowledge  of Stroke prevention medications, dietary restrictions, and follow-up care with the MD post discharge with mod assist from rehab staff.  Outcome: Progressing

## 2019-05-18 NOTE — Progress Notes (Addendum)
Speech Language Pathology Daily Session Note  Patient Details  Name: [name] MRN: [id number] Date of Birth: 1936/08/11  Today's Date: 05/18/2019 SLP Individual Time: 6378-5885 SLP Individual Time Calculation (min): 45 min  Short Term Goals: Week 3: SLP Short Term Goal 1 (Week 3): Patient will demonstrate intellectual awareness and identify 2 physical and 2 cognitive deficits with supervision A multimodal cues.  SLP Short Term Goal 2 (Week 3): Patient will demonstrate functional problem solving for basic tasks with Min A multimodal cues.  SLP Short Term Goal 3 (Week 3): Patient will demonstrate sustained attention to functional tasks for 20 minutes with Min A verbal cues for redirection.  SLP Short Term Goal 4 (Week 3): Pt will utilize compensatory memory aids to recall new and/or daily information with Min A verbal and questions cues SLP Short Term Goal 5 (Week 3): Pt will consume dys 3 and thin diet with mod I use of swallow strategies.  Skilled Therapeutic Interventions: Skilled ST services targeted goals for problem solving, attention, awareness, and compensation strategies for functional recall. SLP facilitated session by reviewing general goals of rehab therapies, including walking, improving strength, balance, endurance, and safety. Pt required mod+ verbal cues to verbalize rationale for therapy activities and purpose of incentive spirometer and the correlation between the two. SLP educated pt regarding use of spirometer, and encouraged pt (and staff) to work with pt on breathing equipment 10x each hour to improve lung health and ability to continue progress in therapies for return home. Pt independently recalled specific instructions and goal of "Blink" card task. Pt was observed drinking thin liquids via straw, and exhibited no overt s/s aspiration. Pt was left in bed with alarm on, all needs within reach. Continued ST intervention is recommended, with focus on established plan of  care.  Pain Pain Assessment Pain Scale: 0-10 Pain Score: 0-No pain  Therapy/Group: Individual Therapy   Celia B. Quentin Ore, [facility], CCC-SLP Speech Language Pathologist  Shonna Chock 05/18/2019, 3:22 PM

## 2019-05-18 NOTE — Progress Notes (Signed)
[place] PHYSICAL MEDICINE & REHABILITATION PROGRESS NOTE   Subjective/Complaints:  No issues overnite , wore spica splint  Sore wrist while eating this am Discussed with RN, acetaminophen and voltaren to be scheduled  ROS:neg CP, SOB, N/V/D  Objective:   No results found. Recent Labs    05/15/19 1040  WBC 10.9*  HGB 7.4*  HCT 24.5*  PLT 552*   No results for input(s): NA, K, CL, CO2, GLUCOSE, BUN, CREATININE, CALCIUM in the last 72 hours.  Intake/Output Summary (Last 24 hours) at 05/18/2019 0803 Last data filed at 05/17/2019 0959 Gross per 24 hour  Intake 120 ml  Output -  Net 120 ml     Physical Exam: Vital Signs Blood pressure (!) 125/48, pulse 78, temperature 98.3 F (36.8 C), temperature source Oral, resp. rate 16, height 4\' 9"  (1.448 m), weight 59 kg, SpO2 93 %.   Constitutional: No distress . Vital signs reviewed. HEENT: EOMI, oral membranes moist Neck: supple Cardiovascular: RRR without murmur. No JVD    Respiratory: CTA Bilaterally without wheezes or rales. Normal effort    GI: BS +, non-tender, non-distended  Extremities: No clubbing, cyanosis, or edema Skin: No evidence of breakdown, no evidence of rash Neurologic: alert, still distracted however, tangential.  Cranial nerves II through XII intact, motor strength is 5/5 in right deltoid, bicep, tricep, grip, hip flexor, knee extensors, ankle dorsiflexor and plantar flexor 3- Left delt bi tri, 3- grip, 2- L hip/knee ext synergy--stable Sensory exam normal sensation to light touch and proprioception in bilateral upper and lower extremities--unchanged  Musculoskeletal: RUE pain over dorsal RIght wrist minimal effusion Psych:sl anxious  Assessment/Plan: 1. Functional deficits secondary to RIght pontine infarct which require 3+ hours per day of interdisciplinary therapy in a comprehensive inpatient rehab setting.  Physiatrist is providing close team supervision and 24 hour management of active medical  problems listed below.  Physiatrist and rehab team continue to assess barriers to discharge/monitor patient progress toward functional and medical goals  Care Tool:  Bathing    Body parts bathed by patient: Chest, Abdomen, Left upper leg, Right upper leg, Face   Body parts bathed by helper: Front perineal area, Buttocks, Left arm, Right arm, Right lower leg, Left lower leg     Bathing assist Assist Level: Moderate Assistance - Patient 50 - 74%     Upper Body Dressing/Undressing Upper body dressing   What is the patient wearing?: Pull over shirt    Upper body assist Assist Level: Maximal Assistance - Patient 25 - 49%    Lower Body Dressing/Undressing Lower body dressing      What is the patient wearing?: Pants     Lower body assist Assist for lower body dressing: Maximal Assistance - Patient 25 - 49%     Toileting Toileting    Toileting assist Assist for toileting: Maximal Assistance - Patient 25 - 49%     Transfers Chair/bed transfer  Transfers assist  Chair/bed transfer activity did not occur: (squat pivot/lateral scoot)  Chair/bed transfer assist level: Moderate Assistance - Patient 50 - 74%     Locomotion Ambulation   Ambulation assist   Ambulation activity did not occur: Safety/medical concerns  Assist level: Moderate Assistance - Patient 50 - 74% Assistive device: Walker-rolling Max distance: 15 ft   Walk 10 feet activity   Assist  Walk 10 feet activity did not occur: Safety/medical concerns  Assist level: Moderate Assistance - Patient - 50 - 74% Assistive device: Walker-rolling   Walk 50 feet  activity   Assist Walk 50 feet with 2 turns activity did not occur: Safety/medical concerns         Walk 150 feet activity   Assist Walk 150 feet activity did not occur: Safety/medical concerns         Walk 10 feet on uneven surface  activity   Assist Walk 10 feet on uneven surfaces activity did not occur: Safety/medical  concerns         Wheelchair     Assist Will patient use wheelchair at discharge?: Yes Type of Wheelchair: Manual    Wheelchair assist level: Dependent - Patient 0% Max wheelchair distance: 150 ft    Wheelchair 50 feet with 2 turns activity    Assist        Assist Level: Dependent - Patient 0%   Wheelchair 150 feet activity     Assist     Assist Level: Dependent - Patient 0%    Medical Problem List and Plan: 1.Functional deficits and left hemiparesissecondary to right pontine infarct on 05/01/2019 CIR PT, OT, SLP2. Antithrombotics: -DVT/anticoagulation:Pharmaceutical:Lovenox -antiplatelet therapy: ASA/Plavix X 3 weeks followed by ASA. 3.Headaches/Chronic back pain/Pain Management:Voltaren gel for hands, increased to 4 times daily -Gabapentin  100 mg HS. -  tramadol prn with local measuresincluding heat -Lidocain patches/heat/ice.Oxycodone not daily use per records  Right wrist splint   4. Mood:LCSW to follow for evaluation and support. -antipsychotic agents: N/A 5. Neuropsych: This patientis notcapable of making decisions onherown behalf.   -some improvement  -probably underlying cognitive deficits at baseline  -neuropsych to see  -continue to support pt at bedside  6. Skin/Wound Care:LCSW tio follow for evaluation and support. 7. Fluids/Electrolytes/Nutrition:Monitor I/O.  D3 thins.  8. HTN: Monitor BP bid--permissive HTN and normalize in 5-7 days.  9. COPD./chronic hypoxemic respiratory failure: Question maintainence medications. Oxygen dependent.added prn neb -OOB, IS 10. Pre renal azotemia resolved with hydration  Intake only 120 cc recorded yesterday  Repeat BUN creatinine normal, GFR normal 11. H/o depression: Continue cymbalta.  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  13. H/o B12 deficiency/Iron deficiency anemia: Hgb  8.4 at admission with mild leucocytosis.   Added iron supplement.  -most recent hgb 8.1, continue with B12 supplementation We will check fecal occult blood- still pending  transfused 1 U with expected improvement in Hgb  recheck 7.4   -B12 supp 14. Dyslipidemia: Now on Lipitor.  15.  Episode of CP normal EKG, exam does show tenderness in the Left parasternal area, troponins neg, CXR showing expected atelectasis Likely MSK-Resolved 16.  RIght wrist synovitis OA Voltaren gel spica at noc to wrist orthosis during the day to allow for increased functional use of RUE  LOS: 16 days A FACE TO [place] [facility]  05/18/2019, 8:03 AM

## 2019-05-19 ENCOUNTER — Inpatient Hospital Stay (HOSPITAL_COMMUNITY): Payer: Medicare Other | Admitting: Speech Pathology

## 2019-05-19 ENCOUNTER — Inpatient Hospital Stay (HOSPITAL_COMMUNITY): Payer: Medicare Other | Admitting: Physical Therapy

## 2019-05-19 ENCOUNTER — Inpatient Hospital Stay (HOSPITAL_COMMUNITY): Payer: Medicare Other | Admitting: Occupational Therapy

## 2019-05-19 NOTE — Progress Notes (Addendum)
Speech Language Pathology Daily Session Note  Patient Details  Name: [name] MRN: [id number] Date of Birth: 1936/08/17  Today's Date: 05/19/2019 SLP Individual Time: 0800-0900, 1215-1230 SLP Individual Time Calculation (min): 60 min, 15 min  Short Term Goals: Week 3: SLP Short Term Goal 1 (Week 3): Patient will demonstrate intellectual awareness and identify 2 physical and 2 cognitive deficits with supervision A multimodal cues.  SLP Short Term Goal 2 (Week 3): Patient will demonstrate functional problem solving for basic tasks with Min A multimodal cues.  SLP Short Term Goal 3 (Week 3): Patient will demonstrate sustained attention to functional tasks for 20 minutes with Min A verbal cues for redirection.  SLP Short Term Goal 4 (Week 3): Pt will utilize compensatory memory aids to recall new and/or daily information with Min A verbal and questions cues SLP Short Term Goal 5 (Week 3): Pt will consume dys 3 and thin diet with mod I use of swallow strategies.  Skilled Therapeutic Interventions: Morning skilled ST services targeted goals for problem solving, attention, awareness, and compensation strategies for functional recall. SLP facilitated session by encouraging problem solving for use of call bell, nursing care, and repositioning. Pt exhibited good attention to task during incentive spirometer use, and was able to reach 750 x1 today. RN present to provide medications, whole with liquid. Pt took multiple pills at once, and exhibited reflexive cough after the swallow. Recommend taking fewer pills at one time. RN requested assistance with current diet/tray set up. Regular (heart healthy) diet was requested for lunch today. Pt was encouraged to notify nursing when her tray arrives for assistance with set up. SLP will follow to determine appropriateness for advanced solid textures. Pt was left in bed with alarm on, all needs within reach. OT present.   Afternoon skilled ST intervention  targeted goals for tolerance of least restrictive diet per pt request to try advanced solids. Pt was observed during lunch with regular texture solids. After set up, pt was able to self-feed, and exhibited no overt s/s aspiration. She was observed taking small bites at slow rate. Pt demonstrated extended oral prep of solid meat, and was unable to tolerate the salad. Will continue dys 3 diet and thin liquids, as pt is tolerating this consistency well. Pt was left in chair with alarm on, all needs within reach. Continue ST per current plan of care.  Pain Pain Assessment Pain Scale: 0-10 Pain Score: 2  Pain Type: Acute pain Pain Location: Arm Pain Orientation: Right Pain Descriptors / Indicators: Discomfort;Grimacing Pain Onset: With Activity Patients Stated Pain Goal: 1 Pain Intervention(s): RN made aware  Therapy/Group: Individual Therapy   [name], [facility], CCC-SLP Speech Language Pathologist  Shonna Chock 05/19/2019, 10:41 AM, 12:30 PM

## 2019-05-19 NOTE — Plan of Care (Signed)
  Problem: Consults Goal: RH STROKE PATIENT EDUCATION Description See Patient Education module for education specifics  Outcome: Progressing Goal: Nutrition Consult-if indicated Outcome: Progressing   Problem: RH BOWEL ELIMINATION Goal: RH STG MANAGE BOWEL WITH ASSISTANCE Description STG Manage Bowel with Max Assistance.  Outcome: Progressing Goal: RH STG MANAGE BOWEL W/MEDICATION W/ASSISTANCE Description STG Manage Bowel with Medication with max Assistance.  Outcome: Progressing   Problem: RH BLADDER ELIMINATION Goal: RH STG MANAGE BLADDER WITH ASSISTANCE Description STG Manage Bladder With max Assistance  Outcome: Progressing   Problem: RH SKIN INTEGRITY Goal: RH STG SKIN FREE OF INFECTION/BREAKDOWN Description Skin to remain free from breakdown while on rehab with max assist.  Outcome: Progressing Goal: RH STG MAINTAIN SKIN INTEGRITY WITH ASSISTANCE Description STG Maintain Skin Integrity With max Assistance and following following a turning and rotation schedule.  Outcome: Progressing Goal: RH STG ABLE TO PERFORM INCISION/WOUND CARE W/ASSISTANCE Description STG Able To Perform Wound/Skin breakdown Care With max Assistance.  Outcome: Progressing   Problem: RH SAFETY Goal: RH STG ADHERE TO SAFETY PRECAUTIONS W/ASSISTANCE/DEVICE Description STG Adhere to Safety Precautions With max Assistance and using appropriate safety and assistive Devices.  Outcome: Progressing   Problem: RH PAIN MANAGEMENT Goal: RH STG PAIN MANAGED AT OR BELOW PT'S PAIN GOAL Description <4 on a 0-10 pain scale.  Outcome: Progressing   Problem: RH KNOWLEDGE DEFICIT Goal: RH STG INCREASE KNOWLEDGE OF HYPERTENSION Description Patient to demonstrate knowledge on HTN medications, BP parameters, and follow-up care with the MD post discharge with mod assist from rehab staff.  Outcome: Progressing Goal: RH STG INCREASE KNOWLEDGE OF STROKE PROPHYLAXIS Description Patient to demonstrate knowledge  of Stroke prevention medications, dietary restrictions, and follow-up care with the MD post discharge with mod assist from rehab staff.  Outcome: Progressing

## 2019-05-19 NOTE — Progress Notes (Addendum)
[place] PHYSICAL MEDICINE & REHABILITATION PROGRESS NOTE   Subjective/Complaints:  No issues overnite, felt like her breakfast was rushed   ROS:neg CP, SOB, N/V/D  Objective:   No results found. No results for input(s): WBC, HGB, HCT, PLT in the last 72 hours. No results for input(s): NA, K, CL, CO2, GLUCOSE, BUN, CREATININE, CALCIUM in the last 72 hours.  Intake/Output Summary (Last 24 hours) at 05/19/2019 0730 Last data filed at 05/18/2019 1900 Gross per 24 hour  Intake 600 ml  Output -  Net 600 ml     Physical Exam: Vital Signs Blood pressure (!) 122/55, pulse 76, temperature (!) 97.3 F (36.3 C), resp. rate 20, height 4\' 9"  (1.448 m), weight 59 kg, SpO2 99 %.   Constitutional: No distress . Vital signs reviewed. HEENT: EOMI, oral membranes moist Neck: supple Cardiovascular: RRR without murmur. No JVD    Respiratory: CTA Bilaterally without wheezes or rales. Normal effort    GI: BS +, non-tender, non-distended  Extremities: No clubbing, cyanosis, or edema Skin: No evidence of breakdown, no evidence of rash Neurologic: alert, still distracted however, tangential.  Cranial nerves II through XII intact, motor strength is 5/5 in right deltoid, bicep, tricep, grip, hip flexor, knee extensors, ankle dorsiflexor and plantar flexor 3- Left delt bi tri, 3- grip, 2- L hip/knee ext synergy--stable Sensory exam normal sensation to light touch and proprioception in bilateral upper and lower extremities--unchanged  Musculoskeletal: RUE pain over dorsal RIght wrist minimal effusion Psych:sl anxious  Assessment/Plan: 1. Functional deficits secondary to RIght pontine infarct which require 3+ hours per day of interdisciplinary therapy in a comprehensive inpatient rehab setting.  Physiatrist is providing close team supervision and 24 hour management of active medical problems listed below.  Physiatrist and rehab team continue to assess barriers to discharge/monitor patient progress  toward functional and medical goals  Care Tool:  Bathing    Body parts bathed by patient: Chest, Abdomen, Left upper leg, Right upper leg, Face   Body parts bathed by helper: Front perineal area, Buttocks, Left arm, Right arm, Right lower leg, Left lower leg     Bathing assist Assist Level: Moderate Assistance - Patient 50 - 74%     Upper Body Dressing/Undressing Upper body dressing   What is the patient wearing?: Pull over shirt    Upper body assist Assist Level: Maximal Assistance - Patient 25 - 49%    Lower Body Dressing/Undressing Lower body dressing      What is the patient wearing?: Pants     Lower body assist Assist for lower body dressing: Maximal Assistance - Patient 25 - 49%     Toileting Toileting    Toileting assist Assist for toileting: Maximal Assistance - Patient 25 - 49%     Transfers Chair/bed transfer  Transfers assist  Chair/bed transfer activity did not occur: (squat pivot/lateral scoot)  Chair/bed transfer assist level: Minimal Assistance - Patient > 75%     Locomotion Ambulation   Ambulation assist   Ambulation activity did not occur: Safety/medical concerns  Assist level: Minimal Assistance - Patient > 75% Assistive device: Walker-rolling Max distance: 67ft   Walk 10 feet activity   Assist  Walk 10 feet activity did not occur: Safety/medical concerns  Assist level: Minimal Assistance - Patient > 75% Assistive device: Walker-rolling   Walk 50 feet activity   Assist Walk 50 feet with 2 turns activity did not occur: Safety/medical concerns         Walk 150 feet activity  Assist Walk 150 feet activity did not occur: Safety/medical concerns         Walk 10 feet on uneven surface  activity   Assist Walk 10 feet on uneven surfaces activity did not occur: Safety/medical concerns         Wheelchair     Assist Will patient use wheelchair at discharge?: Yes Type of Wheelchair: Manual    Wheelchair  assist level: Dependent - Patient 0% Max wheelchair distance: 150 ft    Wheelchair 50 feet with 2 turns activity    Assist        Assist Level: Dependent - Patient 0%   Wheelchair 150 feet activity     Assist     Assist Level: Dependent - Patient 0%    Medical Problem List and Plan: 1.Functional deficits and left hemiparesissecondary to right pontine infarct on 05/01/2019 CIR PT, OT, SLP2. Antithrombotics: -DVT/anticoagulation:Pharmaceutical:Lovenox -antiplatelet therapy: ASA/Plavix X 3 weeks followed by ASA. 3.Headaches/Chronic back pain/Pain Management:Voltaren gel for hands, increased to 4 times daily -Gabapentin  100 mg HS. -  tramadol prn with local measuresincluding heat -Lidocain patches/heat/ice.Oxycodone not daily use per records  Right wrist splint   4. Mood:LCSW to follow for evaluation and support. -antipsychotic agents: N/A 5. Neuropsych: This patientis notcapable of making decisions onherown behalf.   -some improvement  -probably underlying cognitive deficits at baseline  -neuropsych to see  -continue to support pt at bedside  6. Skin/Wound Care:LCSW tio follow for evaluation and support. 7. Fluids/Electrolytes/Nutrition:Monitor I/O.  D3 thins.  8. HTN: Monitor BP bid--permissive HTN and normalize in 5-7 days.  9. COPD./chronic hypoxemic respiratory failure: Question maintainence medications. Oxygen dependent.added prn neb -OOB, IS 10. Pre renal azotemia resolved with hydration  Intake improved to   600 cc recorded yesterday  Repeat BUN creatinine normal, GFR normal 11. H/o depression: Continue cymbalta.  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  13. H/o B12 deficiency/Iron deficiency anemia: Hgb 8.4 at admission with mild leucocytosis.   Added iron supplement.  -most recent hgb 8.1, continue with B12 supplementation We  will check fecal occult blood- still pending  transfused 1 U with expected improvement in Hgb  recheck 7.4   -B12 supp 14. Dyslipidemia: Now on Lipitor.  15.  Episode of CP normal EKG, exam does show tenderness in the Left parasternal area, troponins neg, CXR showing expected atelectasis Likely MSK-Resolved 16.  RIght wrist synovitis OA Voltaren gel spica at noc to wrist orthosis during the day to allow for increased functional use of RUE  LOS: 17 days A FACE TO [place] 05/19/2019, 7:30 AM

## 2019-05-19 NOTE — Progress Notes (Signed)
Occupational Therapy Session Note  Patient Details  Name: [name] MRN: [id number] Date of Birth: 1936-03-06  Today's Date: 05/19/2019 OT Individual Time: 0900-1005 OT Individual Time Calculation (min): 65 min    Short Term Goals: Week 3:  OT Short Term Goal 1 (Week 3): Continue working on established min to mod assist level long term goals.   Skilled Therapeutic Interventions/Progress Updates:    Pt completed shower, dressing, and toileting during session.  Total assist for transfer to the shower with use of the Stedy secondary to time.  She was able to complete UB bathing with min assist and LB bathing with max assist.  UB dressing was completed at max assist as well as LB after toileting.  Pt requested toileting during transfer out of the shower with the Stedy so she was positioned on the 3:1 where toileting and the majority of dressing occurred.  She needed max assist to complete all toilet hygiene and clothing management in standing with use of the Stedy.  Transferred out to the wheelchair where she was assisted with donning gripper socks with total assist.  Pt left up in the wheelchair with safety belt in place and call button and phone in reach.  Nursing made aware that pt needed a new pain patch on her back secondary to removal during shower.  NT also asked to help pt with oral care.    Therapy Documentation Precautions:  Precautions Precautions: Fall Precaution Comments: O2 dependent Restrictions Weight Bearing Restrictions: No  Pain: Pain Assessment Pain Scale: 0-10 Pain Score: 2  Faces Pain Scale: Hurts a little bit Pain Type: Acute pain Pain Location: Arm Pain Orientation: Right Pain Descriptors / Indicators: Discomfort;Grimacing Pain Onset: With Activity Patients Stated Pain Goal: 1 Pain Intervention(s): RN made aware ADL: See Care Tool Section for some details of ADLs  Therapy/Group: Individual Therapy  Bladen Umar OTR/L 05/19/2019, 12:12 PM

## 2019-05-19 NOTE — Progress Notes (Signed)
Physical Therapy Session Note  Patient Details  Name: [name] MRN: [id number] Date of Birth: 1936-11-11  Today's Date: 05/19/2019 PT Individual Time: 3013-1438 PT Individual Time Calculation (min): 49 min   Short Term Goals: Week 3:  PT Short Term Goal 1 (Week 3): STG=LTG due to ELOS  Skilled Therapeutic Interventions/Progress Updates:    Pt received supine in bed and agreeable to therapy session. Pt maintained on 2L of O2 via nasal cannula throughout session. Therapist donned R UE wrist splint for session. Supine>sit, HOB elevated and using bedrails, with min assist for trunk upright and mod assist for scooting hips to EOB - pt required significantly increased time to perform mobility task. Sit>stand EOB>RW with min assist for lifting/balance. Stand pivot transfer EOB>w/c using RW with CGA for steadying.  Transported to/from gym in w/c. Sit<>stand w/c<>RW with min/mod assist for lifting/balance throughout session - cuing for increased anterior trunk lean and hip extension to come to standing. Ambulated 32ft x 2 (seated break between) using RW with min assist for balance throughout - cuing for improved reciprocal stepping pattern - HR prior to ambulation 85bpm, HR after 2nd walk 119bpm and SpO2 98% on 2L of O2. Transported back to room in w/c and left sitting in w/c with needs in reach, seat belt alarm on, and lines intact.  Therapy Documentation Precautions:  Precautions Precautions: Fall Precaution Comments: O2 dependent Required Braces or Orthoses: Other Brace Other Brace: R UE brace during therapy for increased functional use Restrictions Weight Bearing Restrictions: No  Pain:  Reports R hand/wrist pain with increased weightbearing, unrated - cuing throughout session for positioning and increased use of B LEs to limit weightbearing in R UE for pain management.   Therapy/Group: Individual Therapy  Tawana Scale, PT, DPT 05/19/2019, 8:30 AM

## 2019-05-20 MED ORDER — ACETAMINOPHEN-CODEINE #3 300-30 MG PO TABS
1.0000 | ORAL_TABLET | Freq: Three times a day (TID) | ORAL | Status: DC
  Administered 2019-05-20 – 2019-05-22 (×7): 1 via ORAL
  Filled 2019-05-20 (×8): qty 1

## 2019-05-20 NOTE — Plan of Care (Signed)
  Problem: RH BLADDER ELIMINATION Goal: RH STG MANAGE BLADDER WITH ASSISTANCE Description STG Manage Bladder With max Assistance  Outcome: Not Progressing; incontinence

## 2019-05-20 NOTE — Progress Notes (Signed)
[place] PHYSICAL MEDICINE & REHABILITATION PROGRESS NOTE   Subjective/Complaints:  No new complaints today other than pain all over she complains that she has been on chronic narcotic analgesics and that her outpatient doctor has been trying to reduce her dose.  She feels like she has had an exacerbation of all her aches and pains since her stroke.  We discussed that she is more immobile and that reduced mobility can increase joint stiffness  ROS:neg CP, SOB, N/V/D  Objective:   No results found. No results for input(s): WBC, HGB, HCT, PLT in the last 72 hours. No results for input(s): NA, K, CL, CO2, GLUCOSE, BUN, CREATININE, CALCIUM in the last 72 hours.  Intake/Output Summary (Last 24 hours) at 05/20/2019 1330 Last data filed at 05/20/2019 0844 Gross per 24 hour  Intake 600 ml  Output -  Net 600 ml     Physical Exam: Vital Signs Blood pressure (!) 121/54, pulse 78, temperature 98.5 F (36.9 C), temperature source Oral, resp. rate 19, height 4\' 9"  (1.448 m), weight 59 kg, SpO2 100 %.   Constitutional: No distress . Vital signs reviewed. HEENT: EOMI, oral membranes moist Neck: supple Cardiovascular: RRR without murmur. No JVD    Respiratory: CTA Bilaterally without wheezes or rales. Normal effort    GI: BS +, non-tender, non-distended  Extremities: No clubbing, cyanosis, or edema Skin: No evidence of breakdown, no evidence of rash Neurologic: alert, still distracted however, tangential.  Cranial nerves II through XII intact, motor strength is 5/5 in right deltoid, bicep, tricep, grip, hip flexor, knee extensors, ankle dorsiflexor and plantar flexor 3- Left delt bi tri, 3- grip, 2- L hip/knee ext synergy--stable Sensory exam normal sensation to light touch and proprioception in bilateral upper and lower extremities--unchanged  Musculoskeletal: RUE pain over dorsal RIght wrist minimal effusion Psych:sl anxious  Assessment/Plan: 1. Functional deficits secondary to RIght  pontine infarct which require 3+ hours per day of interdisciplinary therapy in a comprehensive inpatient rehab setting.  Physiatrist is providing close team supervision and 24 hour management of active medical problems listed below.  Physiatrist and rehab team continue to assess barriers to discharge/monitor patient progress toward functional and medical goals  Care Tool:  Bathing    Body parts bathed by patient: Chest, Abdomen, Left upper leg, Right upper leg, Face, Left arm, Right arm, Right lower leg, Left lower leg   Body parts bathed by helper: Front perineal area, Buttocks, Left lower leg, Right lower leg     Bathing assist Assist Level: Maximal Assistance - Patient 24 - 49%     Upper Body Dressing/Undressing Upper body dressing   What is the patient wearing?: Pull over shirt    Upper body assist Assist Level: Moderate Assistance - Patient 50 - 74%    Lower Body Dressing/Undressing Lower body dressing      What is the patient wearing?: Pants, Orthosis     Lower body assist Assist for lower body dressing: Maximal Assistance - Patient 25 - 49%     Toileting Toileting    Toileting assist Assist for toileting: Maximal Assistance - Patient 25 - 49%     Transfers Chair/bed transfer  Transfers assist  Chair/bed transfer activity did not occur: (squat pivot/lateral scoot)  Chair/bed transfer assist level: Minimal Assistance - Patient > 75%(stand pivot with RW)     Locomotion Ambulation   Ambulation assist   Ambulation activity did not occur: Safety/medical concerns  Assist level: Minimal Assistance - Patient > 75% Assistive device: Walker-rolling  Max distance: 42ft   Walk 10 feet activity   Assist  Walk 10 feet activity did not occur: Safety/medical concerns  Assist level: Minimal Assistance - Patient > 75% Assistive device: Walker-rolling   Walk 50 feet activity   Assist Walk 50 feet with 2 turns activity did not occur: Safety/medical  concerns         Walk 150 feet activity   Assist Walk 150 feet activity did not occur: Safety/medical concerns         Walk 10 feet on uneven surface  activity   Assist Walk 10 feet on uneven surfaces activity did not occur: Safety/medical concerns         Wheelchair     Assist Will patient use wheelchair at discharge?: Yes Type of Wheelchair: Manual    Wheelchair assist level: Dependent - Patient 0% Max wheelchair distance: 150 ft    Wheelchair 50 feet with 2 turns activity    Assist        Assist Level: Dependent - Patient 0%   Wheelchair 150 feet activity     Assist     Assist Level: Dependent - Patient 0%    Medical Problem List and Plan: 1.Functional deficits and left hemiparesissecondary to right pontine infarct on 05/01/2019 CIR PT, OT, SLP2. Antithrombotics: -DVT/anticoagulation:Pharmaceutical:Lovenox -antiplatelet therapy: ASA/Plavix X 3 weeks followed by ASA. 3.Headaches/Chronic back pain/Pain Management:Voltaren gel for hands, increased to 4 times daily -Gabapentin  100 mg HS. -  tramadol prn with local measuresincluding heat -Lidocain patches/heat/ice.  DC oxycodone and scheduled Tylenol with codeine 3 times daily  Right wrist splint   4. Mood:LCSW to follow for evaluation and support. -antipsychotic agents: N/A 5. Neuropsych: This patientis notcapable of making decisions onherown behalf.   -some improvement  -probably underlying cognitive deficits at baseline  -neuropsych to see  -continue to support pt at bedside  6. Skin/Wound Care:LCSW tio follow for evaluation and support. 7. Fluids/Electrolytes/Nutrition:Monitor I/O.  D3 thins.  8. HTN: Monitor BP bid--permissive HTN and normalize in 5-7 days.  9. COPD./chronic hypoxemic respiratory failure: Question maintainence medications. Oxygen dependent.added prn neb -OOB,  IS 10. Pre renal azotemia resolved with hydration  Intake improved to   600 cc recorded yesterday  Repeat BUN creatinine normal, GFR normal 11. H/o depression: Continue cymbalta.  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  13. H/o B12 deficiency/Iron deficiency anemia: Hgb 8.4 at admission with mild leucocytosis.   Added iron supplement.  -most recent hgb 8.1, continue with B12 supplementation We will check fecal occult blood- still pending  transfused 1 U with expected improvement in Hgb  recheck 7.4   -B12 supp 14. Dyslipidemia: Now on Lipitor.  15.  Episode of CP normal EKG, exam does show tenderness in the Left parasternal area, troponins neg, CXR showing expected atelectasis Likely MSK-Resolved 16.  RIght wrist synovitis OA Voltaren gel spica at noc to wrist orthosis during the day to allow for increased functional use of RUE  LOS: 18 days A FACE TO [place] E [place] 05/20/2019, 1:30 PM

## 2019-05-20 NOTE — Patient Care Conference (Signed)
Inpatient RehabilitationTeam Conference and Plan of Care Update Date: 05/17/2019   Time: 11:15 AM    Patient Name: [name]      Medical Record Number: [id number]  Date of Birth: 31-Jan-1936 Sex: Female         Room/Bed: 4W10C/4W10C-01 Payor Info: Payor: [employer] MEDICARE / Plan: [employer] MEDICARE / Product Type: *No Product type* /    Admitting Diagnosis: CVA 2 Team  Rt CVA; 22-24days  Admit Date/Time:  05/02/2019  5:41 PM Admission Comments: No comment available   Primary Diagnosis:  <principal problem not specified> Principal Problem: <principal problem not specified>  Patient Active Problem List   Diagnosis Date Noted  . Right pontine stroke ([place]) 05/02/2019  . Stroke ([place]) 05/01/2019  . Acute renal failure superimposed on stage 3 chronic kidney disease ([place]) 05/01/2019  . Acute ischemic stroke ([place]) 05/01/2019  . AKI (acute kidney injury) ([place]) 08/29/2018  . Encephalopathy acute 08/22/2018  . Chronic back pain 08/22/2018  . Vitamin B12 deficiency 08/19/2018  . Dehydration   . Tachycardia   . Altered mental state 08/18/2018  . Closed compression fracture of L1 vertebra ([facility])   . Acute cystitis without hematuria   . Pain management   . Intractable pain   . Back pain 10/10/2016  . L2 vertebral fracture ([place]) 10/10/2016  . Acute encephalopathy 12/25/2015  . Drug-induced delirium ([place]) 12/25/2015  . Acute on chronic respiratory failure ([place]) 12/25/2015  . CAP (community acquired pneumonia) 12/19/2015  . Chronic respiratory failure ([place]) 03/19/2014  . Depression 03/19/2014  . Aspiration pneumonia ([place]) 03/10/2014  . Acute respiratory failure with hypoxia ([place]) 03/10/2014  . Sepsis ([place]) 03/10/2014  . Hypokalemia 03/10/2014  . Hypertension 03/10/2014  . COPD (chronic obstructive pulmonary disease) ([place]) 03/10/2014  . Tobacco abuse 03/10/2014  . Normocytic anemia 06/15/2013  . Generalized abdominal pain 06/15/2013  . Blood in the stool 06/15/2013    Expected  Discharge Date: Expected Discharge Date: 05/24/19  Team Members Present: Physician leading conference: Dr. Alysia Penna Social Worker Present: Alfonse Alpers, LCSW Nurse Present: Other (comment)(Akivia Cletus Gash, LPN) PT Present: Michaelene Song, PT OT Present: Clyda Greener, OT SLP Present: Gunnar Fusi, SLP PPS Coordinator present : Gunnar Fusi     Current Status/Progress Goal Weekly Team Focus  Medical   No longer paranoid, tolerating therapy but has right wrist synovitis.  X-rays negative.  Reduce pain from multiple sources including shoulder wrist back  Continue rehabilitation efforts with orthotic management   Bowel/Bladder   Pt is incontinent/continent of B/B. LBM 05/13/2019  Encourage bowel movement via Dulcolax suppository and Miralax PO per orders. Encourage timed toileting.   Assist with toileting needs PRN.   Swallow/Nutrition/ Hydration      Mod I   carryover of swallow strategies and thin   ADL's   Max assist for UB and LB selfcare sit to stand.  Max assist for dressing sit to stand as well.  She needs max assist for stand pivot transfers with use of the RW.  Increased LUE functional use ot Brunnstrum stage IV-V level. Reports increased pain in the right wrist and shoulder as well.  min to mod assist  selfcare retraining, balance retraining, transfer training, DME education, pt/family education   Mobility   mod assist bed mobility, mod assist stand pivot with RW, mod assist gait with RW 10-15 ft  min assist for transfers and gait 20 ft  balance, standing tolerance, gait, transfers, neuro re-ed   Communication  Safety/Cognition/ Behavioral Observations  Min-Mod A  Min- Supervision A   basic problem solving, recall, intellectual awarenes and sustained attention   Pain   Pt complains of intermittent pain of 83 yo R arm.  Pain managed with Oxycodone.  Pain < 3  Assess pain Q shift and PRN.   Skin   Pt has Stage 2 medical device associaited pressure injury  to R ear r/t chronic O2 usage.  Treat skin issues per orders to prevent further skin breakdown.  Assess skin Q shift and PRN.    Rehab Goals Patient on target to meet rehab goals: Yes Rehab Goals Revised: goals are min/mod A *See Care Plan and progress notes for long and short-term goals.     Barriers to Discharge  Current Status/Progress Possible Resolutions Date Resolved   Physician    Medical stability     Slow progress, baseline cognitive deficits as well as multiple osteoarthritic joints  See above      Nursing                  PT                    OT                  SLP                SW                Discharge Planning/Teaching Needs:  Pt to return to her home where her son will assist her.  Pt feels he can provide mod A level of care.  Son coming in on 05-22-19 for family education   Team Discussion:  Pt with wrist xray and it seems pt has osteoarthritis and Dr. Letta Pate ordered a splint, but it's not great to use for functional tasks.  Pt is incontinent X 2 and stool sample is pending BM.  Pt needs max for all UB self care tasks due to pain in right shoulder and wrist.  L arm is improving, just weak.  Pt is mod A for sit to stand, but moves slow and needs max A to pull up pants.  Pt is mod with bed mobility and transfers are stand pivot with RW at mod/max A.  Pt is progressing with gait to 10-15' mod A.  Standing balance is better with RW min A.  Pt is on D3 diet thin liquids, with trials of regular textures, but might not be safe at home.  Min/mod A for problem solving, attention - pt internally distracted, memory (strategies for at home - son already assists).  Revisions to Treatment Plan:  none    Continued Need for Acute Rehabilitation Level of Care: The patient requires daily medical management by a physician with specialized training in physical medicine and rehabilitation for the following conditions: Daily direction of a multidisciplinary physical rehabilitation  program to ensure safe treatment while eliciting the highest outcome that is of practical value to the patient.: Yes Daily medical management of patient stability for increased activity during participation in an intensive rehabilitation regime.: Yes Daily analysis of laboratory values and/or radiology reports with any subsequent need for medication adjustment of medical intervention for : Neurological problems;Pulmonary problems   I attest that I was present, lead the team conference, and concur with the assessment and plan of the team.Team conference was held via web/ teleconference due to [place] - 19.   Cyndy Braver, Silvestre Mesi 05/20/2019,  8:13 PM

## 2019-05-20 NOTE — Progress Notes (Signed)
Social Work Patient ID: [name], female   DOB: 05-Dec-1936, 83 y.o.   MRN: [id number]   CSW met with pt and later talked with her son via telephone to update them on team conference discussion and targeted d/c date of 05-25-19.  Son, [name], will come for family education on 05-22-19.  CSW will continue to follow and assist as needed.

## 2019-05-21 ENCOUNTER — Inpatient Hospital Stay (HOSPITAL_COMMUNITY): Payer: Medicare Other | Admitting: Physical Therapy

## 2019-05-21 LAB — OCCULT BLOOD X 1 CARD TO LAB, STOOL: Fecal Occult Bld: POSITIVE — AB

## 2019-05-21 NOTE — Progress Notes (Signed)
Physical Therapy Session Note  Patient Details  Name: [name] MRN: [id number] Date of Birth: 1936/10/06  Today's Date: 05/21/2019 PT Individual Time: 9166-0600 PT Individual Time Calculation (min): 53 min   Short Term Goals: Week 3:  PT Short Term Goal 1 (Week 3): STG=LTG due to ELOS  Skilled Therapeutic Interventions/Progress Updates:   Pt received sitting up in bed finishing lunch and agreeable to therapy session. Pt maintained on 2L of O2 via nasal cannula throughout session. Supine>sit EOB with HOB maximally elevated and mod/max assist for scooting hips. Sit>stand from EOB/w/c to RW with min assist for lifting throughout session. Stand pivot transfer EOB>w/c using RW with min assist/CGA for steadying. Pt noted to be incontinent of bladder in brief. Pt stood with B UE support on RW and close supervision for balance while therapist performed total assist LB clothing mangement and peri-care. Transported to/from gym in w/c. Ambulated 75ft x2 (seated break between) using RW with min assist for balancing throughout - pt demonstrates decreased B LE step length, inconsistent stepping, and increased shuffling steps this session compared to prior session - manual facilitation and multimodal cuing throughout for improved gait mechanics - monitored HR to be 119bpm after first walk and 124bpm after 2nd with SpO2 maintaining between 97% and 99% (on 2L). Pt educated on importance of increasing OOB activity during the day including getting OOB for all meals. Transported back to room and pt left sitting in w/c with needs in reach, seat belt alarm on, and nursing staff educated to encourage pt to stay OOB.   Therapy Documentation Precautions:  Precautions Precautions: Fall Precaution Comments: O2 dependent Required Braces or Orthoses: Other Brace Other Brace: R UE brace during therapy for increased functional use Restrictions Weight Bearing Restrictions: No  Pain: Reports pain in hands; however,  denies wearing splint during therapy stating it prevents her from being able to move her hand.   Therapy/Group: Individual Therapy  Tawana Scale, PT, DPT 05/21/2019, 12:40 PM

## 2019-05-21 NOTE — Progress Notes (Signed)
MD notified of positive hemoccult.

## 2019-05-21 NOTE — Plan of Care (Signed)
  Problem: RH BLADDER ELIMINATION Goal: RH STG MANAGE BLADDER WITH ASSISTANCE Description STG Manage Bladder With max Assistance  05/21/2019 1417 by Ander Slade, RN Outcome: Not Progressing; incontinence Problem: RH PAIN MANAGEMENT Goal: RH STG PAIN MANAGED AT OR BELOW PT'S PAIN GOAL Description <4 on a 0-10 pain scale.  05/21/2019 1417 by Ander Slade, RN Outcome: Progressing; pt on scheduled pain med

## 2019-05-22 ENCOUNTER — Ambulatory Visit (HOSPITAL_COMMUNITY): Payer: Medicare Other

## 2019-05-22 ENCOUNTER — Encounter (HOSPITAL_COMMUNITY): Payer: Medicare Other

## 2019-05-22 ENCOUNTER — Inpatient Hospital Stay (HOSPITAL_COMMUNITY): Payer: Medicare Other | Admitting: Occupational Therapy

## 2019-05-22 ENCOUNTER — Encounter (HOSPITAL_COMMUNITY): Payer: Medicare Other | Admitting: Occupational Therapy

## 2019-05-22 DIAGNOSIS — Z9981 Dependence on supplemental oxygen: Secondary | ICD-10-CM

## 2019-05-22 DIAGNOSIS — D62 Acute posthemorrhagic anemia: Secondary | ICD-10-CM

## 2019-05-22 DIAGNOSIS — D649 Anemia, unspecified: Secondary | ICD-10-CM

## 2019-05-22 DIAGNOSIS — D638 Anemia in other chronic diseases classified elsewhere: Secondary | ICD-10-CM

## 2019-05-22 LAB — PREPARE RBC (CROSSMATCH)

## 2019-05-22 LAB — BASIC METABOLIC PANEL
Anion gap: 11 (ref 5–15)
BUN: 22 mg/dL (ref 8–23)
CO2: 31 mmol/L (ref 22–32)
Calcium: 8.9 mg/dL (ref 8.9–10.3)
Chloride: 98 mmol/L (ref 98–111)
Creatinine, Ser: 0.84 mg/dL (ref 0.44–1.00)
GFR calc Af Amer: >60 mL/min (ref >60)
GFR calc non Af Amer: >60 mL/min (ref >60)
Glucose, Bld: 103 mg/dL — ABNORMAL HIGH (ref 70–99)
Potassium: 4 mmol/L (ref 3.5–5.1)
Sodium: 140 mmol/L (ref 135–145)

## 2019-05-22 LAB — CBC
HCT: 21 % — ABNORMAL LOW (ref 36.0–46.0)
HCT: 20 % — ABNORMAL LOW (ref 36.0–46.0)
Hemoglobin: 6.2 g/dL — CL (ref 12.0–15.0)
Hemoglobin: 5.9 g/dL — CL (ref 12.0–15.0)
MCH: 25.9 pg — ABNORMAL LOW (ref 26.0–34.0)
MCH: 26 pg (ref 26.0–34.0)
MCHC: 29.5 g/dL — ABNORMAL LOW (ref 30.0–36.0)
MCHC: 29.5 g/dL — ABNORMAL LOW (ref 30.0–36.0)
MCV: 87.9 fL (ref 80.0–100.0)
MCV: 88.1 fL (ref 80.0–100.0)
Platelets: 577 10*3/uL — ABNORMAL HIGH (ref 150–400)
Platelets: 572 10*3/uL — ABNORMAL HIGH (ref 150–400)
RBC: 2.39 MIL/uL — ABNORMAL LOW (ref 3.87–5.11)
RBC: 2.27 MIL/uL — ABNORMAL LOW (ref 3.87–5.11)
RDW: 15 % (ref 11.5–15.5)
RDW: 14.8 % (ref 11.5–15.5)
WBC: 9 10*3/uL (ref 4.0–10.5)
WBC: 8.2 10*3/uL (ref 4.0–10.5)
nRBC: 0 % (ref 0.0–0.2)
nRBC: 0 % (ref 0.0–0.2)

## 2019-05-22 LAB — OCCULT BLOOD X 1 CARD TO LAB, STOOL: Fecal Occult Bld: POSITIVE — AB

## 2019-05-22 MED ORDER — SODIUM CHLORIDE 0.9% IV SOLUTION
Freq: Once | INTRAVENOUS | Status: AC
  Administered 2019-05-22: 12:00:00 via INTRAVENOUS

## 2019-05-22 MED ORDER — PANTOPRAZOLE SODIUM 40 MG PO TBEC
40.0000 mg | DELAYED_RELEASE_TABLET | Freq: Two times a day (BID) | ORAL | Status: DC
  Administered 2019-05-22 – 2019-05-26 (×8): 40 mg via ORAL
  Filled 2019-05-22 (×9): qty 1

## 2019-05-22 MED ORDER — FUROSEMIDE 10 MG/ML IJ SOLN
20.0000 mg | Freq: Once | INTRAMUSCULAR | Status: AC
  Administered 2019-05-22: 20 mg via INTRAVENOUS
  Filled 2019-05-22: qty 2

## 2019-05-22 MED ORDER — DIPHENHYDRAMINE HCL 25 MG PO CAPS
25.0000 mg | ORAL_CAPSULE | Freq: Once | ORAL | Status: AC
  Administered 2019-05-22: 25 mg via ORAL
  Filled 2019-05-22: qty 1

## 2019-05-22 MED ORDER — ACETAMINOPHEN 325 MG PO TABS
650.0000 mg | ORAL_TABLET | Freq: Once | ORAL | Status: AC
  Administered 2019-05-22: 650 mg via ORAL
  Filled 2019-05-22: qty 2

## 2019-05-22 NOTE — Progress Notes (Signed)
Physical Therapy Session Note  Patient Details  Name: [name] MRN: [id number] Date of Birth: 1936-11-28  Today's Date: 05/22/2019 PT Individual Time: 1500-1530 PT Individual Time Calculation (min): 30 min  and Today's Date: 05/22/2019 PT Missed Time: 30 Minutes Missed Time Reason: Patient ill (Comment);Patient fatigue(low hemoglobin, recieving transfusion)  Short Term Goals: Week 3:  PT Short Term Goal 1 (Week 3): STG=LTG due to ELOS  Skilled Therapeutic Interventions/Progress Updates:    Pt supine in bed upon PT arrival, nursing recommending bed level exercises this session secondary to low hemoglobin levels and pt receiving blood transfusion. Pt supine in bed performed spirometer exercise x 10 with cues with techniques and deep inhale. Pt performed supine LE strengthening exercises this session with therapist providing cues for techniques, 2 x 10 each: hip flexion, hip abduction, SAQ, ankle pumps and bridges. Pt reports feeling tired, she reports "I've been really sleepy all afternoon." Therapist performed B LE hamstring and heel cord stretching this session 2 x 30 sec holds bilaterally. Pt continues to report feeling too tired. Pt missed 30 minutes of skilled therapy tx secondary to fatigue and medical status.   Therapy Documentation Precautions:  Precautions Precautions: Fall Precaution Comments: O2 dependent Required Braces or Orthoses: Other Brace Other Brace: R UE brace during therapy for increased functional use Restrictions Weight Bearing Restrictions: No    Therapy/Group: Individual Therapy  Netta Corrigan, PT, DPT 05/22/2019, 7:49 AM

## 2019-05-22 NOTE — Progress Notes (Signed)
Patient verbalized to therapist that she is agreeable to endoscopy tomorrow. NPO from midnight orders noted. Will hand off to oncoming RN.

## 2019-05-22 NOTE — Progress Notes (Signed)
[facility] PHYSICAL MEDICINE & REHABILITATION PROGRESS NOTE   Subjective/Complaints: Patient seen sitting up in bed this morning.  She states she slept well overnight.  She states she wants to go home.  ROS: Denies CP, SOB, N/V/D  Objective:   No results found. Recent Labs    05/22/19 0613 05/22/19 0914  WBC 9.0 8.2  HGB 6.2* 5.9*  HCT 21.0* 20.0*  PLT 577* 572*   Recent Labs    05/22/19 0613  NA 140  K 4.0  CL 98  CO2 31  GLUCOSE 103*  BUN 22  CREATININE 0.84  CALCIUM 8.9    Intake/Output Summary (Last 24 hours) at 05/22/2019 1035 Last data filed at 05/22/2019 0813 Gross per 24 hour  Intake 510 ml  Output 100 ml  Net 410 ml     Physical Exam: Vital Signs Blood pressure (!) 122/50, pulse 78, temperature 97.8 F (36.6 C), temperature source Oral, resp. rate 18, height 4\' 9"  (1.448 m), weight 59 kg, SpO2 99 %. Constitutional: No distress . Vital signs reviewed. HENT: Normocephalic.  Atraumatic. Eyes: EOMI. No discharge. Cardiovascular: No JVD. Respiratory: Normal effort.  + [place]. GI: Non-distended. Musc: No edema in extremities. Skin: No evidence of breakdown, no evidence of rash Neurologic: Alert Motor: RUE/RLE: Grossly 5/5 proximal distal LUE/LLE: Grossly 3-/5 proximal distal  Assessment/Plan: 1. Functional deficits secondary to RIght pontine infarct which require 3+ hours per day of interdisciplinary therapy in a comprehensive inpatient rehab setting.  Physiatrist is providing close team supervision and 24 hour management of active medical problems listed below.  Physiatrist and rehab team continue to assess barriers to discharge/monitor patient progress toward functional and medical goals  Care Tool:  Bathing    Body parts bathed by patient: Chest, Abdomen, Left upper leg, Right upper leg, Face, Left arm, Right arm, Right lower leg, Left lower leg   Body parts bathed by helper: Front perineal area, Buttocks, Left lower leg, Right lower leg      Bathing assist Assist Level: Maximal Assistance - Patient 24 - 49%     Upper Body Dressing/Undressing Upper body dressing   What is the patient wearing?: Pull over shirt    Upper body assist Assist Level: Moderate Assistance - Patient 50 - 74%    Lower Body Dressing/Undressing Lower body dressing      What is the patient wearing?: Pants, Orthosis     Lower body assist Assist for lower body dressing: Maximal Assistance - Patient 25 - 49%     Toileting Toileting    Toileting assist Assist for toileting: Maximal Assistance - Patient 25 - 49%     Transfers Chair/bed transfer  Transfers assist  Chair/bed transfer activity did not occur: (squat pivot/lateral scoot)  Chair/bed transfer assist level: Minimal Assistance - Patient > 75%(stand pivot with RW)     Locomotion Ambulation   Ambulation assist   Ambulation activity did not occur: Safety/medical concerns  Assist level: Minimal Assistance - Patient > 75% Assistive device: Walker-rolling Max distance: 50ft   Walk 10 feet activity   Assist  Walk 10 feet activity did not occur: Safety/medical concerns  Assist level: Minimal Assistance - Patient > 75% Assistive device: Walker-rolling   Walk 50 feet activity   Assist Walk 50 feet with 2 turns activity did not occur: Safety/medical concerns         Walk 150 feet activity   Assist Walk 150 feet activity did not occur: Safety/medical concerns  Walk 10 feet on uneven surface  activity   Assist Walk 10 feet on uneven surfaces activity did not occur: Safety/medical concerns         Wheelchair     Assist Will patient use wheelchair at discharge?: Yes Type of Wheelchair: Manual    Wheelchair assist level: Dependent - Patient 0% Max wheelchair distance: 150 ft    Wheelchair 50 feet with 2 turns activity    Assist        Assist Level: Dependent - Patient 0%   Wheelchair 150 feet activity     Assist     Assist  Level: Dependent - Patient 0%    Medical Problem List and Plan: 1.Functional deficits and left hemiparesissecondary to right pontine infarct on 05/01/2019  Cont CIR   Weekend notes reviewed-complaints of acute on chronic pain, noted to have dark stools 2. Antithrombotics: -DVT/anticoagulation:Pharmaceutical:Lovenox -antiplatelet therapy: ASA/Plavix X 3 weeks followed by ASA. 3.Headaches/Chronic back pain/Pain Management:Voltaren gel for hands, increased to 4 times daily -Gabapentin  100 mg HS. -  tramadol prn with local measuresincluding heat -Lidocain patches/heat/ice.  DC oxycodone and scheduled Tylenol with codeine 3 times daily  Right wrist splint 4. Mood:LCSW to follow for evaluation and support. -antipsychotic agents: N/A 5. Neuropsych: This patientis notcapable of making decisions onherown behalf.   -some improvement  -probably underlying cognitive deficits at baseline  -neuropsych to see  -continue to support pt at bedside  6. Skin/Wound Care:LCSW tio follow for evaluation and support. 7. Fluids/Electrolytes/Nutrition:Monitor I/O.  D3 thins.  8. HTN: Monitor BP bid  Controlled on 5/25 9. COPD./chronic hypoxemic respiratory failure: Question maintainence medications.  Supplemental oxygen dependent. Added prn neb -OOB, IS 10. Pre renal azotemia resolved with hydration  Repeat BUN creatinine normal, GFR normal on 5/25 11. H/o depression: Continue cymbalta.  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  13. H/o B12 deficiency/Iron deficiency anemia: Hgb 8.4 at admission with mild leucocytosis.   Added iron supplement.  B12 supp  Critical hemoglobin of 5.9 on 5/25, will repeat  Will likely require transfusion.  Will consider GI consult.  Please see PA note.  Discussed with PA. 14. Dyslipidemia: Now on Lipitor.  15.  Episode of CP normal EKG, exam does show tenderness  in the Left parasternal area, troponins neg, CXR showing expected atelectasis Likely MSK-Resolved 16.  RIght wrist synovitis OA Voltaren gel  LOS: 20 days A FACE TO FACE EVALUATION WAS PERFORMED  [name] [name] 05/22/2019, 10:35 AM

## 2019-05-22 NOTE — Progress Notes (Signed)
Occupational Therapy Session Note  Patient Details  Name: [name] MRN: [id number] Date of Birth: 20-Sep-1936  Today's Date: 05/22/2019 OT Individual Time: 1320-1400 OT Individual Time Calculation (min): 40 min    Short Term Goals: Week 3:  OT Short Term Goal 1 (Week 3): Continue working on established min to mod assist level long term goals.   Skilled Therapeutic Interventions/Progress Updates:    Pt with limited OT session today secondary to decreased hemoglobin and currently receiving blood transfusion.  MD restricted bed level or EOB therapies.  Pt agreed to participate in therapy at bed level but stated she did not feel like sitting up on the side of the bed.  Focused session on self feeding using the LUE to eat her peaches and pudding.  She was able to self feed with min assist for the fork and spoon, with assist for positioning of the utensil and for occasional scooping of the food.  Pt was surprised at how well she was able to use the LUE to eat secondary to being right handed.  Finished session with SLP in to work with pt for next session.  Call button and phone in reach as well.    Therapy Documentation Precautions:  Precautions Precautions: Fall Precaution Comments: O2 dependent Required Braces or Orthoses: Other Brace Other Brace: R UE brace during therapy for increased functional use Restrictions Weight Bearing Restrictions: No General: General OT Amount of Missed Time: 20 Minutes  Pain: Pain Assessment Pain Scale: Faces Pain Score: 0-No pain  Therapy/Group: Individual Therapy  Adnan Vanvoorhis OTR/L 05/22/2019, 3:40 PM

## 2019-05-22 NOTE — Progress Notes (Signed)
Occupational Therapy Session Note  Patient Details  Name: [name] MRN: [id number] Date of Birth: [birth year]  Today's Date: 05/22/2019 OT Individual Time: 6568-1275 OT Individual Time Calculation (min): 35 min    Short Term Goals: Week 3:  OT Short Term Goal 1 (Week 3): Continue working on established min to mod assist level long term goals.   Skilled Therapeutic Interventions/Progress Updates:    Per nursing patient to have bedside therapy only today due to pending transfusion and medical status.  Patient alert and happy to participate in activity.  Completed sponge bath - mod/max A due to positioning, changed hospital gown with min A, oral care with set up.  Patient remained in bed, missed 10 minutes due to IV team need to complete care.  Left with nursing at close of session.    Therapy Documentation Precautions:  Precautions Precautions: Fall Precaution Comments: O2 dependent Required Braces or Orthoses: Other Brace Other Brace: R UE brace during therapy for increased functional use Restrictions Weight Bearing Restrictions: No General: General OT Amount of Missed Time: 10 Minutes Vital Signs: Therapy Vitals Temp: 98 F (36.7 C) Temp Source: Oral Pulse Rate: 72 Resp: 20 BP: (!) 129/47 Patient Position (if appropriate): Lying Oxygen Therapy SpO2: 100 % O2 Device: Nasal Cannula O2 Flow Rate (L/min): 2 L/min Pain: Pain Assessment Pain Scale: 0-10 Pain Score: 0-No pain Pain Type: Chronic pain Pain Location: Back Pain Orientation: Right Pain Descriptors / Indicators: Aching Pain Frequency: Intermittent Pain Onset: On-going Pain Intervention(s): Medication (See eMAR) Other Treatments:     Therapy/Group: Individual Therapy  Carlos Levering 05/22/2019, 12:37 PM

## 2019-05-22 NOTE — Progress Notes (Signed)
Critical lab reported for hemoglobin. Notified PA

## 2019-05-22 NOTE — Progress Notes (Signed)
Patient has had steady drop in H/H. Will recheck--will type/cross and transfuse if still below 7.0. Patient and son updated and agreeable.   No abdominal pain, N/V or other GI symptoms reported. She reports issues with chronic constipation and Hems at home. Nursing has not reported any BRBPR.but black stools documented this weekend. She has not had any GI work up in the past--no baseline colonoscopy.  Plavix stops today. Will d/c Lovenox. Consult GI for input/work up. Son contacted ---will change family ED to tomorrow based on work up needed.

## 2019-05-22 NOTE — Consult Note (Signed)
[facility] Gastroenterology Consultation Note  Referring Provider: Dr. Alysia Penna (Rehab) Primary Care Physician:  [name], MD  Reason for Consultation:  Anemia, hemoccult positive.  HPI: [name] is a 83 y.o. female admitted about 3 weeks ago after stroke with right-hemiparesis.  She has developed some purported dark stools and interval decrease in Hgb, as she was placed on intensive anticoagulant and antiplatelet therapy (which today and 4 days ago were stopped, respectively).  She has been hemoccult-positive.  No abdominal pain, hematemesis, red blood in stool, GERD.  No PPI.  Anemia and melena in 2014 with endoscopy at that time showing gastritis and erosions.   Past Medical History:  Diagnosis Date  . CAP (community acquired pneumonia)    multiple admissions  . Chronic back pain 08/22/2018  . Colitis 03/19/2014  . Compression fx, lumbar spine ([place])   . COPD (chronic obstructive pulmonary disease) ([place])   . Delirium   . Hypertension   . On home oxygen therapy 06-14-13   continuos 2.5l/m nasally-24/7  . Osteoporosis   . Sacral fracture ([place]) 2014    Past Surgical History:  Procedure Laterality Date  . ABDOMINAL HYSTERECTOMY    . ESOPHAGOGASTRODUODENOSCOPY N/A 06/15/2013   Procedure: ESOPHAGOGASTRODUODENOSCOPY (EGD);  Surgeon: [name], MD;  Location: [facility];  Service: Endoscopy;  Laterality: N/A;  . IR GENERIC HISTORICAL  10/15/2016   IR KYPHO LUMBAR INC FX REDUCE BONE BX UNI/BIL CANNULATION INC/IMAGING 10/15/2016 Luanne Bras, MD [facility]-INTERV RAD  . sacroplasty  06-14-13   05-10-13 IVR [facility] for fracture stabilization    Prior to Admission medications   Medication Sig Start Date End Date Taking? Authorizing Provider  acetaminophen (TYLENOL) 500 MG tablet Take 1 tablet (500 mg total) by mouth 3 (three) times daily. 08/22/18   Eugenie Filler, MD  albuterol (PROVENTIL HFA;VENTOLIN HFA) 108 (90 Base) MCG/ACT inhaler Inhale 1-2 puffs into the  lungs every 4 (four) hours as needed for wheezing or shortness of breath. 06/09/18   Duffy Bruce, MD  amLODipine (NORVASC) 5 MG tablet Take 1 tablet (5 mg total) by mouth daily. 08/22/18   Eugenie Filler, MD  aspirin EC 81 MG EC tablet Take 1 tablet (81 mg total) by mouth daily. 05/03/19   Black, Lezlie Octave, NP  atorvastatin (LIPITOR) 20 MG tablet Take 1 tablet (20 mg total) by mouth daily at 6 PM. 05/02/19   Black, Lezlie Octave, NP  clopidogrel (PLAVIX) 75 MG tablet Take 1 tablet (75 mg total) by mouth daily for 21 days. 05/03/19 05/24/19  Radene Gunning, NP  diclofenac sodium (VOLTAREN) 1 % GEL Apply 2 g topically 4 (four) times daily.    [provider]  docusate sodium (COLACE) 250 MG capsule Take 1 capsule (250 mg total) by mouth daily. 06/09/18   Duffy Bruce, MD  DULoxetine (CYMBALTA) 30 MG capsule Take 30-60 capsules by mouth See admin instructions. 1 capsule daily for 1 week, increase to 2 capsules therafter 07/12/18   [provider]  feeding supplement, ENSURE ENLIVE, (ENSURE ENLIVE) LIQD Take 237 mLs by mouth 2 (two) times daily between meals. 09/03/18   Debbe Odea, MD  gabapentin (NEURONTIN) 100 MG capsule Take 100-300 mg by mouth at bedtime as needed for pain. 07/12/18   [provider]  gabapentin (NEURONTIN) 300 MG capsule Take 300 mg by mouth 3 (three) times daily. 04/19/19   [provider]  lidocaine (LIDODERM) 5 % Place 1 patch onto the skin daily. Remove & Discard patch  within 12 hours or as directed by MD 08/23/18   Eugenie Filler, MD  lisinopril (ZESTRIL) 20 MG tablet Take 20 mg by mouth daily. 03/21/19   [provider]  lisinopril-hydrochlorothiazide (ZESTORETIC) 10-12.5 MG tablet Take 1 tablet by mouth daily. 02/28/19   [provider]  oxyCODONE (OXY IR/ROXICODONE) 5 MG immediate release tablet Take 1 tablet (5 mg total) by mouth every 6 (six) hours as needed for breakthrough pain. 09/03/18   Debbe Odea, MD  senna (SENOKOT) 8.6  MG TABS tablet Take 1 tablet (8.6 mg total) by mouth 2 (two) times daily. 08/22/18   Eugenie Filler, MD  Syringe/Needle, Disp, (SYRINGE 3CC/22GX3/4") 22G X 3/4" 3 ML MISC 1,000 mcg by Does not apply route daily. 08/22/18   Eugenie Filler, MD  vitamin B-12 1000 MCG tablet Take 1 tablet (1,000 mcg total) by mouth daily. 09/04/18   Debbe Odea, MD    Current Facility-Administered Medications  Medication Dose Route Frequency Provider Last Rate Last Dose  . acetaminophen (TYLENOL) tablet 650 mg  650 mg Oral TID AC Kirsteins, Luanna Salk, MD   650 mg at 05/22/19 1320  . acetaminophen-codeine (TYLENOL #3) 300-30 MG per tablet 1 tablet  1 tablet Oral TID Charlett Blake, MD   1 tablet at 05/22/19 [phone]  . albuterol (PROVENTIL) (2.5 MG/3ML) 0.083% nebulizer solution 2.5 mg  2.5 mg Nebulization Q4H PRN Love, Pamela S, PA-C      . alum & mag hydroxide-simeth (MAALOX/MYLANTA) 200-200-20 MG/5ML suspension 30 mL  30 mL Oral Q4H PRN Love, Pamela S, PA-C      . aspirin EC tablet 81 mg  81 mg Oral Daily Bary Leriche, PA-C   81 mg at 05/22/19 6712  . atorvastatin (LIPITOR) tablet 20 mg  20 mg Oral q1800 Bary Leriche, PA-C   20 mg at 05/21/19 1747  . bisacodyl (DULCOLAX) suppository 10 mg  10 mg Rectal Daily PRN Bary Leriche, PA-C   10 mg at 05/21/19 1445  . dextromethorphan-guaiFENesin (MUCINEX DM) 30-600 MG per 12 hr tablet 1 tablet  1 tablet Oral BID PRN Bary Leriche, PA-C   1 tablet at 05/12/19 0855  . diclofenac sodium (VOLTAREN) 1 % transdermal gel 2 g  2 g Topical QID Charlett Blake, MD   2 g at 05/22/19 1247  . diphenhydrAMINE (BENADRYL) 12.5 MG/5ML elixir 12.5-25 mg  12.5-25 mg Oral Q6H PRN Love, Pamela S, PA-C      . docusate sodium (COLACE) capsule 250 mg  250 mg Oral Daily Bary Leriche, PA-C   250 mg at 05/22/19 4580  . DULoxetine (CYMBALTA) DR capsule 60 mg  60 mg Oral Daily Bary Leriche, PA-C   60 mg at 05/22/19 0940  . furosemide (LASIX) injection 20 mg  20 mg Intravenous Once  Love, Pamela S, PA-C      . gabapentin (NEURONTIN) capsule 100 mg  100 mg Oral QHS Love, Pamela S, PA-C   100 mg at 05/21/19 2118  . guaiFENesin-dextromethorphan (ROBITUSSIN DM) 100-10 MG/5ML syrup 5-10 mL  5-10 mL Oral Q6H PRN Love, Pamela S, PA-C      . ipratropium-albuterol (DUONEB) 0.5-2.5 (3) MG/3ML nebulizer solution 3 mL  3 mL Nebulization Q4H PRN Love, Pamela S, PA-C      . lidocaine (LIDODERM) 5 % 1 patch  1 patch Transdermal Q24H Bary Leriche, PA-C   1 patch at 05/22/19 [phone]  . Muscle Rub CREA   Topical BID PRN  Kirsteins, Luanna Salk, MD      . prochlorperazine (COMPAZINE) tablet 5-10 mg  5-10 mg Oral Q6H PRN Love, Pamela S, PA-C       Or  . prochlorperazine (COMPAZINE) injection 5-10 mg  5-10 mg Intramuscular Q6H PRN Love, Pamela S, PA-C       Or  . prochlorperazine (COMPAZINE) suppository 12.5 mg  12.5 mg Rectal Q6H PRN Love, Pamela S, PA-C      . senna-docusate (Senokot-S) tablet 2 tablet  2 tablet Oral BID Charlett Blake, MD   2 tablet at 05/22/19 [phone]  . sodium phosphate (FLEET) 7-19 GM/118ML enema 1 enema  1 enema Rectal Once PRN Love, Pamela S, PA-C      . traMADol Veatrice Bourbon) tablet 50 mg  50 mg Oral Q6H PRN Bary Leriche, PA-C   50 mg at 05/22/19 0309  . traZODone (DESYREL) tablet 25-50 mg  25-50 mg Oral QHS PRN Bary Leriche, PA-C   50 mg at 05/21/19 2118  . vitamin B-12 (CYANOCOBALAMIN) tablet 1,000 mcg  1,000 mcg Oral Daily Bary Leriche, PA-C   1,000 mcg at 05/22/19 0940    Allergies as of 05/02/2019 - Review Complete 05/02/2019  Allergen Reaction Noted  . Ambien [zolpidem] Other (See Comments) 12/20/2015    Family History  Problem Relation Age of Onset  . Hypertension Sister   . Diabetes Mellitus II Sister     Social History   Socioeconomic History  . Marital status: Divorced    Spouse name: Not on file  . Number of children: Not on file  . Years of education: Not on file  . Highest education level: Not on file  Occupational History  . Not on file   Social Needs  . Financial resource strain: Not on file  . Food insecurity:    Worry: Not on file    Inability: Not on file  . Transportation needs:    Medical: Not on file    Non-medical: Not on file  Tobacco Use  . Smoking status: Former Smoker    Types: Cigarettes    Last attempt to quit: 06/15/2011    Years since quitting: 7.9  . Smokeless tobacco: Never Used  Substance and Sexual Activity  . Alcohol use: No  . Drug use: No  . Sexual activity: Never  Lifestyle  . Physical activity:    Days per week: Not on file    Minutes per session: Not on file  . Stress: Not on file  Relationships  . Social connections:    Talks on phone: Not on file    Gets together: Not on file    Attends religious service: Not on file    Active member of club or organization: Not on file    Attends meetings of clubs or organizations: Not on file    Relationship status: Not on file  . Intimate partner violence:    Fear of current or ex partner: Not on file    Emotionally abused: Not on file    Physically abused: Not on file    Forced sexual activity: Not on file  Other Topics Concern  . Not on file  Social History Narrative  . Not on file    Review of Systems: As per HPI, all others negative  Physical Exam: Vital signs in last 24 hours: Temp:  [97.8 F (36.6 C)-98.3 F (36.8 C)] 98 F (36.7 C) (05/25 1158) Pulse Rate:  [72-99] 72 (05/25 1158) Resp:  [18-20] 20 (  05/25 1158) BP: (115-129)/(47-64) 129/47 (05/25 1158) SpO2:  [93 %-100 %] 100 % (05/25 1158) Last BM Date: 05/21/19 General:   Alert,  Well-developed, well-nourished, pleasant and cooperative in NAD Head:  Normocephalic and atraumatic. Eyes:  Sclera clear, no icterus.   Conjunctiva pink. Ears:  Normal auditory acuity. Nose:  No deformity, discharge,  or lesions. Mouth:  No deformity or lesions.  Oropharynx pink & moist. Neck:  Supple; no masses or thyromegaly. Lungs:  Clear throughout to auscultation.   No wheezes,  crackles, or rhonchi. No acute distress. Heart:  Regular rate and rhythm; no murmurs, clicks, rubs,  or gallops. Abdomen:  Soft, nontender and nondistended. No masses, hepatosplenomegaly or hernias noted. Normal bowel sounds, without guarding, and without rebound.     Msk:  Symmetrical without gross deformities. Normal posture. Pulses:  Normal pulses noted. Extremities:  Without clubbing or edema. Neurologic:  Alert and  oriented x4;  Right hemiparesis (improving), otherwise grossly normal neurologically. Skin:  Scattered ecchymoses, otherwise intact without significant lesions or rashes. Cervical Nodes:  No significant cervical adenopathy. Psych:  Alert and cooperative. Normal mood and affect.   Lab Results: Recent Labs    05/22/19 0613 05/22/19 0914  WBC 9.0 8.2  HGB 6.2* 5.9*  HCT 21.0* 20.0*  PLT 577* 572*   BMET Recent Labs    05/22/19 0613  NA 140  K 4.0  CL 98  CO2 31  GLUCOSE 103*  BUN 22  CREATININE 0.84  CALCIUM 8.9   LFT No results for input(s): PROT, ALBUMIN, AST, ALT, ALKPHOS, BILITOT, BILIDIR, IBILI in the last 72 hours. PT/INR No results for input(s): LABPROT, INR in the last 72 hours.  Studies/Results: No results found.  Impression:  1.  Anemia, recurrent.  2.  Possible dark stools.  Hemoccult-positive 3.  Recent (3 weeks) stroke, with prolonged rehab stay, improving.  Plan:  1.  PPI. 2.  Follow CBCs, transfuse as needed. 3.  Stopping Lovenox, and she has been off of Clopidigrel for the past few days. 4.  I discussed possibility of endoscopy; she wants to think things over, not sure she wants it done (nursing tells me she is not competent to make medical decisions, but she appears alert/competent to me, so I wouldn't pursue an invasive procedure in this circumstance without her consent regardless). 5.  NPO after midnight just in case she opts for endoscopy.  Pending endoscopy findings, could consider colon cancer screening evaluation (CT  colonography versus conventional optical colonoscopy) as outpatient. 6.  [facility] will follow.   LOS: 20 days   Undine Nealis M  05/22/2019, 2:00 PM  Cell [phone] If no answer or after 5 PM call [phone]

## 2019-05-22 NOTE — Progress Notes (Signed)
Speech Language Pathology Daily Session Note  Patient Details  Name: TZIPORA MCINROY MRN: [id number] Date of Birth: 1936/01/21  Today's Date: 05/22/2019 SLP Individual Time: 1405-1502 SLP Individual Time Calculation (min): 57 min  Short Term Goals: Week 3: SLP Short Term Goal 1 (Week 3): Patient will demonstrate intellectual awareness and identify 2 physical and 2 cognitive deficits with supervision A multimodal cues.  SLP Short Term Goal 2 (Week 3): Patient will demonstrate functional problem solving for basic tasks with Min A multimodal cues.  SLP Short Term Goal 3 (Week 3): Patient will demonstrate sustained attention to functional tasks for 20 minutes with Min A verbal cues for redirection.  SLP Short Term Goal 4 (Week 3): Pt will utilize compensatory memory aids to recall new and/or daily information with Min A verbal and questions cues SLP Short Term Goal 5 (Week 3): Pt will consume dys 3 and thin diet with mod I use of swallow strategies.  Skilled Therapeutic Interventions: Skilled ST services focused on cognitive skills. Pt was receiving blood transfusion upon entering room and stated felt overall fatigued.  SLP facilitated basic problem solving skills utilizing novel tasks (connect four and blink played at simplest level) pt required supervision A verbal cues for basic problem solving and recalled 3 out 3 rules for card task. Pt demonstrated sustained attention in 20 minute intervals with min A verbal cues for redirection. Pt demonstrated recall of incentive spirometer with min A verbal cues for reasoning for use/when to use it and performed x10 up to level 500. Pt was left in room with call bell within reach and bed alarm set. ST recommends to continue skilled ST services.      Pain Pain Assessment Pain Scale: 0-10 Pain Score: 0-No pain  Therapy/Group: Individual Therapy  Sylus Stgermain  [facility] 05/22/2019, 3:09 PM

## 2019-05-23 ENCOUNTER — Inpatient Hospital Stay (HOSPITAL_COMMUNITY): Payer: Medicare Other | Admitting: Occupational Therapy

## 2019-05-23 ENCOUNTER — Inpatient Hospital Stay (HOSPITAL_COMMUNITY): Payer: Medicare Other

## 2019-05-23 ENCOUNTER — Inpatient Hospital Stay (HOSPITAL_COMMUNITY): Payer: Medicare Other | Admitting: Speech Pathology

## 2019-05-23 LAB — BPAM RBC
Blood Product Expiration Date: 202006212359
Blood Product Expiration Date: 202006222359
ISSUE DATE / TIME: 202005251124
ISSUE DATE / TIME: 202005251642
Unit Type and Rh: 5100
Unit Type and Rh: 5100

## 2019-05-23 LAB — TYPE AND SCREEN
ABO/RH(D): O POS
Antibody Screen: NEGATIVE
Unit division: 0
Unit division: 0

## 2019-05-23 LAB — CBC
HCT: 32.2 % — ABNORMAL LOW (ref 36.0–46.0)
Hemoglobin: 10.3 g/dL — ABNORMAL LOW (ref 12.0–15.0)
MCH: 27.5 pg (ref 26.0–34.0)
MCHC: 32 g/dL (ref 30.0–36.0)
MCV: 86.1 fL (ref 80.0–100.0)
Platelets: 522 10*3/uL — ABNORMAL HIGH (ref 150–400)
RBC: 3.74 MIL/uL — ABNORMAL LOW (ref 3.87–5.11)
RDW: 14.6 % (ref 11.5–15.5)
WBC: 10 10*3/uL (ref 4.0–10.5)
nRBC: 0 % (ref 0.0–0.2)

## 2019-05-23 LAB — SARS CORONAVIRUS 2 BY RT PCR (HOSPITAL ORDER, PERFORMED IN ~~LOC~~ HOSPITAL LAB): SARS Coronavirus 2: NEGATIVE

## 2019-05-23 MED ORDER — ACETAMINOPHEN-CODEINE #3 300-30 MG PO TABS
1.0000 | ORAL_TABLET | Freq: Three times a day (TID) | ORAL | Status: DC | PRN
  Administered 2019-05-23 – 2019-05-26 (×3): 1 via ORAL
  Filled 2019-05-23 (×3): qty 1

## 2019-05-23 NOTE — Progress Notes (Signed)
The patient has had an appropriate posttransfusion rise in her hemoglobin following yesterday's transfusion of 2 units of packed cells.  On further reflection, she has decided to go ahead with endoscopic evaluation, which I think is a good idea to help guide therapy going forward, since (in view of her recent stroke) she would probably benefit from chronic antiplatelet therapy.  The patient remains on low-dose daily aspirin, as well as pantoprazole 40 mg daily, but is no longer on Lovenox or Plavix.  On exam, the patient is alert, pleasant, and appears to have some upper extremity parapsoas but no facial droop, no impairment of cognition or speech.  Heart is normal, chest is clear, abdomen slightly adipose but without evident mass or tenderness.  I reviewed the purpose and risks of endoscopic evaluation in the setting with both the patient's son, [name] (by telephone) and with the patient at the bedside, and we are all in agreement that it is appropriate to proceed.  The patient has not yet been tested for COVID, so we will do that this afternoon and do her exam in the morning, assuming the test comes back negative.  The patient and her son understand that there is a somewhat increased risk of anesthesia (specifically, general anesthesia because of the COVID pandemic) given her recent stroke, but given the significance of her blood loss and her ongoing need for antiplatelet therapy, I do feel the benefits outweigh the risk in this case.  Cleotis Nipper, M.D. Pager [phone] If no answer or after 5 PM call [phone]

## 2019-05-23 NOTE — Progress Notes (Signed)
Physical Therapy Session Note  Patient Details  Name: [name] MRN: [id number] Date of Birth: Nov 11, 1936  Today's Date: 05/23/2019 PT Individual Time: 1330-1430 PT Individual Time Calculation (min): 60 min   Short Term Goals: Week 3:  PT Short Term Goal 1 (Week 3): STG=LTG due to ELOS  Skilled Therapeutic Interventions/Progress Updates:     Pt supine in bed upon PT arrival, agreeable to therapy tx and denies pain. Pt transferred to sitting EOB with mod assist, therapist asked pt if she needed to use bathroom, pt reports yes. Pt maintained sitting balance while EOB with supervision while therapist retrieved Stedy. Pt performed sit<>stand within stedy min assist. Pt transferred bed>toilet with stedy, dependent transfer using lift. Pt continent of bowel and bladder this session, pt maintained standing balance within stedy with min assist while performing peri care with supervision and clothing management with assist. Pt performed standing within stedy while washing hands at the sink, CGA for balance. Pt transferred to w/c using stedy. Pt transported to the gym. Pt ambulated x 5 ft to the mat with RW and min assist. Pt worked on standing balance this session with RW and CGA while tossing horseshoes. Pt ambulated x 20 ft this session from the mat>w/c this session with RW and min assist overall, some increased assist needed with turn to sit. Pt transported back to room and set up to eat lunch, pt is no longer NPO until midnight tonight and she will have her procedure tomorrow. Pt left in w/c with needs in reach and chair alarm set.   Therapy Documentation Precautions:  Precautions Precautions: Fall Precaution Comments: O2 dependent Required Braces or Orthoses: Other Brace Other Brace: R UE brace during therapy for increased functional use Restrictions Weight Bearing Restrictions: No    Therapy/Group: Individual Therapy  Netta Corrigan, PT, DPT 05/23/2019, 7:47 AM

## 2019-05-23 NOTE — Progress Notes (Signed)
[place] PHYSICAL MEDICINE & REHABILITATION PROGRESS NOTE   Subjective/Complaints: Appreciate GI note, no abd pain, pt NPO   ROS: Denies CP, SOB, N/V/D  Objective:   No results found. Recent Labs    05/22/19 0914 05/23/19 0515  WBC 8.2 10.0  HGB 5.9* 10.3*  HCT 20.0* 32.2*  PLT 572* 522*   Recent Labs    05/22/19 0613  NA 140  K 4.0  CL 98  CO2 31  GLUCOSE 103*  BUN 22  CREATININE 0.84  CALCIUM 8.9    Intake/Output Summary (Last 24 hours) at 05/23/2019 0724 Last data filed at 05/22/2019 2056 Gross per 24 hour  Intake 1078 ml  Output -  Net 1078 ml     Physical Exam: Vital Signs Blood pressure (!) 142/66, pulse 77, temperature 97.7 F (36.5 C), temperature source Oral, resp. rate 16, height 4\' 9"  (1.448 m), weight 59.8 kg, SpO2 98 %. Constitutional: No distress . Vital signs reviewed. HENT: Normocephalic.  Atraumatic. Eyes: EOMI. No discharge. Cardiovascular: No JVD. Respiratory: Normal effort.  + [place]. GI: Non-distended. Musc: No edema in extremities. Skin: No evidence of breakdown, no evidence of rash Neurologic: Alert Motor: RUE/RLE: Grossly 5/5 proximal distal LUE/LLE: Grossly 3-/5 proximal distal  Assessment/Plan: 1. Functional deficits secondary to RIght pontine infarct which require 3+ hours per day of interdisciplinary therapy in a comprehensive inpatient rehab setting.  Physiatrist is providing close team supervision and 24 hour management of active medical problems listed below.  Physiatrist and rehab team continue to assess barriers to discharge/monitor patient progress toward functional and medical goals  Care Tool:  Bathing    Body parts bathed by patient: Chest, Abdomen, Left upper leg, Right upper leg, Face, Left arm, Right arm, Right lower leg, Left lower leg   Body parts bathed by helper: Front perineal area, Buttocks, Left lower leg, Right lower leg     Bathing assist Assist Level: Maximal Assistance - Patient 24 - 49%      Upper Body Dressing/Undressing Upper body dressing   What is the patient wearing?: Orthosis Orthosis activity level: Performed by helper  Upper body assist Assist Level: Maximal Assistance - Patient 25 - 49%    Lower Body Dressing/Undressing Lower body dressing      What is the patient wearing?: Orthosis     Lower body assist Assist for lower body dressing: Maximal Assistance - Patient 25 - 49%     Toileting Toileting    Toileting assist Assist for toileting: Maximal Assistance - Patient 25 - 49%     Transfers Chair/bed transfer  Transfers assist  Chair/bed transfer activity did not occur: (squat pivot/lateral scoot)  Chair/bed transfer assist level: Minimal Assistance - Patient > 75%(stand pivot with RW)     Locomotion Ambulation   Ambulation assist   Ambulation activity did not occur: Safety/medical concerns  Assist level: Minimal Assistance - Patient > 75% Assistive device: Walker-rolling Max distance: 29ft   Walk 10 feet activity   Assist  Walk 10 feet activity did not occur: Safety/medical concerns  Assist level: Minimal Assistance - Patient > 75% Assistive device: Walker-rolling   Walk 50 feet activity   Assist Walk 50 feet with 2 turns activity did not occur: Safety/medical concerns         Walk 150 feet activity   Assist Walk 150 feet activity did not occur: Safety/medical concerns         Walk 10 feet on uneven surface  activity   Assist Walk 10 feet  on uneven surfaces activity did not occur: Safety/medical concerns         Wheelchair     Assist Will patient use wheelchair at discharge?: Yes Type of Wheelchair: Manual    Wheelchair assist level: Dependent - Patient 0% Max wheelchair distance: 150 ft    Wheelchair 50 feet with 2 turns activity    Assist        Assist Level: Dependent - Patient 0%   Wheelchair 150 feet activity     Assist     Assist Level: Dependent - Patient 0%    Medical  Problem List and Plan: 1.Functional deficits and left hemiparesissecondary to right pontine infarct on 05/01/2019  Cont CIR , may do PT, OT until EGD today Team conf in am 2. Antithrombotics: -DVT/anticoagulation:Pharmaceutical:Lovenox -antiplatelet therapy: ASA/Plavix X 3 weeks followed by ASA. 3.Headaches/Chronic back pain/Pain Management:Voltaren gel for hands, increased to 4 times daily -Gabapentin  100 mg HS. -  tramadol prn with local measuresincluding heat -Lidocain patches/heat/ice.  DC oxycodone and scheduled Tylenol with codeine 3 times daily, was on chronic opioids at home   Right wrist splint 4. Mood:LCSW to follow for evaluation and support. -antipsychotic agents: N/A 5. Neuropsych: This patientis notcapable of making decisions onherown behalf.   -some improvement  -probably underlying cognitive deficits at baseline  -neuropsych to see  -continue to support pt at bedside  6. Skin/Wound Care:LCSW tio follow for evaluation and support. 7. Fluids/Electrolytes/Nutrition:Monitor I/O.  D3 thins.  8. HTN: Monitor BP bid   Vitals:   05/22/19 2056 05/23/19 0453  BP: 133/64 (!) 142/66  Pulse: 77 77  Resp: 16 16  Temp: 97.8 F (36.6 C) 97.7 F (36.5 C)  SpO2: 98% 98%   9. COPD./chronic hypoxemic respiratory failure: Question maintainence medications.  Supplemental oxygen dependent. Added prn neb -OOB, IS 10. Pre renal azotemia resolved with hydration  Repeat BUN creatinine normal, GFR normal on 5/25 11. H/o depression: Continue cymbalta.  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  13. H/o B12 deficiency/Iron deficiency anemia: Hgb 8.4 at admission with mild leucocytosis.   Added iron supplement.  B12 supp  Critical hemoglobin of 5.9 on 5/25, improved after transfusion , EGD today 14. Dyslipidemia: Now on Lipitor.  15.  Episode of CP normal EKG, exam does show  tenderness in the Left parasternal area, troponins neg, CXR showing expected atelectasis Likely MSK-Resolved 16.  RIght wrist synovitis OA Voltaren gel  LOS: 21 days A FACE TO FACE EVALUATION WAS PERFORMED  Charlett Blake 05/23/2019, 7:24 AM

## 2019-05-23 NOTE — Progress Notes (Signed)
Speech Language Pathology Daily Session Note  Patient Details  Name: [name] MRN: [id number] Date of Birth: September 26, 1936  Today's Date: 05/23/2019 SLP Individual Time: 1030-1100 SLP Individual Time Calculation (min): 30 min  Short Term Goals: Week 3: SLP Short Term Goal 1 (Week 3): Patient will demonstrate intellectual awareness and identify 2 physical and 2 cognitive deficits with supervision A multimodal cues.  SLP Short Term Goal 2 (Week 3): Patient will demonstrate functional problem solving for basic tasks with Min A multimodal cues.  SLP Short Term Goal 3 (Week 3): Patient will demonstrate sustained attention to functional tasks for 20 minutes with Min A verbal cues for redirection.  SLP Short Term Goal 4 (Week 3): Pt will utilize compensatory memory aids to recall new and/or daily information with Min A verbal and questions cues SLP Short Term Goal 5 (Week 3): Pt will consume dys 3 and thin diet with mod I use of swallow strategies.  Skilled Therapeutic Interventions:  Skilled treatment session focused on cognition goals. SLP received pt supine in bed and pt expressed that she felt down. Pt able to explain understanding of scheduled EGD to assess possible source of blood loss. Pt able to express nature of procedure and continued to voice concern regarding decline in medical condition. Pt appeared tired. Support and encouragement provided. Pt didn't feel up to structured tasks. Pt left in bed, bed alarm on and all needs within reach. Continue per current plan of care.      Pain Pain Assessment Pain Scale: 0-10 Pain Score: 0-No pain  Therapy/Group: Individual Therapy  Alejandro Adcox 05/23/2019, 11:10 AM

## 2019-05-23 NOTE — Progress Notes (Signed)
Occupational Therapy Session Note  Patient Details  Name: [name] MRN: [id number] Date of Birth: [birth year]  Today's Date: 05/23/2019 OT Individual Time: 5597-4163 OT Individual Time Calculation (min): 74 min    Short Term Goals: Week 3:  OT Short Term Goal 1 (Week 3): Continue working on established min to mod assist level long term goals.   Skilled Therapeutic Interventions/Progress Updates:    Pt completed bathing and dressing sit to stand EOB during session.  She was able to transfer from supine to sit EOB with max assist.  Increased posterior lean noted in sitting with mod assist needed for initial sitting balance, progressing to min contact guard while completing bathing.  Increased pain in the right wrist still present with functional use during session, with min assist needed to assist with squeezing out the washcloth.  Min assist for incorporation of the LUE as an active assist for bathing as well.  Mod assist for sit to stand at the EOB for LB bathing.  Max assist for donning hospital gown and total assist for donning TEDs.  Pt with current NPO status and awaiting possible endoscopy so transferred her back to the bed with max assist.  Call button and phone in reach with bed alarm in place.    Therapy Documentation Precautions:  Precautions Precautions: Fall Precaution Comments: O2 dependent Required Braces or Orthoses: Other Brace Other Brace: R UE brace during therapy for increased functional use Restrictions Weight Bearing Restrictions: No  Pain: Pain Assessment Pain Scale: 0-10 Pain Score: 0-No pain Faces Pain Scale: Hurts little more Pain Type: Acute pain Pain Location: Generalized Pain Descriptors / Indicators: Discomfort Pain Onset: With Activity Pain Intervention(s): Repositioned ADL: See Care Tool Section for some details of ADL  Therapy/Group: Individual Therapy  Flor Houdeshell OTR/L 05/23/2019, 11:42 AM

## 2019-05-24 ENCOUNTER — Inpatient Hospital Stay (HOSPITAL_COMMUNITY): Payer: Medicare Other | Admitting: Occupational Therapy

## 2019-05-24 ENCOUNTER — Inpatient Hospital Stay (HOSPITAL_COMMUNITY): Payer: Medicare Other | Admitting: Anesthesiology

## 2019-05-24 ENCOUNTER — Inpatient Hospital Stay (HOSPITAL_COMMUNITY): Payer: Medicare Other | Admitting: Speech Pathology

## 2019-05-24 ENCOUNTER — Inpatient Hospital Stay (HOSPITAL_COMMUNITY): Payer: Medicare Other

## 2019-05-24 ENCOUNTER — Encounter (HOSPITAL_COMMUNITY): Payer: Self-pay | Admitting: *Deleted

## 2019-05-24 ENCOUNTER — Ambulatory Visit (HOSPITAL_COMMUNITY): Admission: RE | Admit: 2019-05-24 | Payer: Medicare Other | Source: Home / Self Care | Admitting: Gastroenterology

## 2019-05-24 ENCOUNTER — Encounter (HOSPITAL_COMMUNITY)
Admission: RE | Disposition: A | Discharge status: Home Health | Payer: Self-pay | Source: Intra-hospital | Attending: Physical Medicine & Rehabilitation

## 2019-05-24 HISTORY — PX: ESOPHAGOGASTRODUODENOSCOPY (EGD) WITH PROPOFOL: SHX5813

## 2019-05-24 HISTORY — PX: BIOPSY: SHX5522

## 2019-05-24 SURGERY — ESOPHAGOGASTRODUODENOSCOPY (EGD) WITH PROPOFOL
Anesthesia: Monitor Anesthesia Care

## 2019-05-24 MED ORDER — ONDANSETRON HCL 4 MG/2ML IJ SOLN
INTRAMUSCULAR | Status: DC | PRN
  Administered 2019-05-24: 4 mg via INTRAVENOUS

## 2019-05-24 MED ORDER — PROPOFOL 500 MG/50ML IV EMUL
INTRAVENOUS | Status: DC | PRN
  Administered 2019-05-24: 75 ug/kg/min via INTRAVENOUS

## 2019-05-24 MED ORDER — LACTATED RINGERS IV SOLN
INTRAVENOUS | Status: DC
  Administered 2019-05-24: 12:00:00 via INTRAVENOUS

## 2019-05-24 MED ORDER — SODIUM CHLORIDE 0.9 % IV SOLN: INTRAVENOUS | Status: DC

## 2019-05-24 MED ORDER — PROPOFOL 10 MG/ML IV BOLUS
INTRAVENOUS | Status: DC | PRN
  Administered 2019-05-24: 30 mg via INTRAVENOUS

## 2019-05-24 SURGICAL SUPPLY — 14 items

## 2019-05-24 NOTE — Progress Notes (Signed)
Occupational Therapy Session Note  Patient Details  Name: [name] MRN: [id number] Date of Birth: 28-Oct-1936  Today's Date: 05/24/2019 OT Individual Time: 0800-0901 OT Individual Time Calculation (min): 61 min    Short Term Goals: Week 3:  OT Short Term Goal 1 (Week 3): Continue working on established min to mod assist level long term goals.   Skilled Therapeutic Interventions/Progress Updates:    Pt completed supine to sit EOB with mod assist to start session.  She then worked on bathing in unsupported sitting with min guard assist for balance.  UB bathing was completed with min assist and min instructional cueing.  While bathing she voiced the need to toilet urgently, so [name] was used for transfer into the bathroom.  She completed sit to stand pulling up on the [place] with min assist.  Brief management was max assist standing as well as for toilet hygiene.  New gown donned in sitting as well with total assist before transferring back to the EOB via [place].  She then transitioned to supine with mod facilitation.  No noted blood visually in stool with successful bowel movement, however pt did exhibit slightly bloody nose when she blew it.  Pt left with call button and phone in reach with safety alarm in place.    Therapy Documentation Precautions:  Precautions Precautions: Fall Precaution Comments: O2 dependent Required Braces or Orthoses: Other Brace Other Brace: R UE brace during therapy for increased functional use Restrictions Weight Bearing Restrictions: No  Pain: Pain Assessment Pain Scale: Faces Faces Pain Scale: Hurts little more Pain Type: Chronic pain Pain Location: Arm Pain Orientation: Right Pain Descriptors / Indicators: Discomfort Pain Onset: With Activity Pain Intervention(s): Repositioned ADL: ADL Eating: Set up Where Assessed-Eating: Wheelchair Grooming: Minimal assistance Where Assessed-Grooming: Wheelchair Upper Body Bathing: Maximal assistance Where  Assessed-Upper Body Bathing: Wheelchair Lower Body Bathing: Dependent Where Assessed-Lower Body Bathing: Bed level Upper Body Dressing: Maximal assistance Where Assessed-Upper Body Dressing: Wheelchair Lower Body Dressing: Dependent Where Assessed-Lower Body Dressing: Bed level Toileting: Dependent Where Assessed-Toileting: Bed level Toilet Transfer: Dependent Toilet Transfer Method: Stand pivot Toilet Transfer Equipment: Bedside commode   Therapy/Group: Individual Therapy  Sinjin Amero OTR/L 05/24/2019, 10:34 AM

## 2019-05-24 NOTE — Progress Notes (Signed)
[place] PHYSICAL MEDICINE & REHABILITATION PROGRESS NOTE   Subjective/Complaints: Endoscopy per GI SARS covid  2 neg Discussed d/c date with pt and OT  ROS: Denies CP, SOB, N/V/D  Objective:   No results found. Recent Labs    05/22/19 0914 05/23/19 0515  WBC 8.2 10.0  HGB 5.9* 10.3*  HCT 20.0* 32.2*  PLT 572* 522*   Recent Labs    05/22/19 0613  NA 140  K 4.0  CL 98  CO2 31  GLUCOSE 103*  BUN 22  CREATININE 0.84  CALCIUM 8.9    Intake/Output Summary (Last 24 hours) at 05/24/2019 0759 Last data filed at 05/23/2019 1906 Gross per 24 hour  Intake 200 ml  Output -  Net 200 ml     Physical Exam: Vital Signs Blood pressure (!) 149/66, pulse 83, temperature 98.3 F (36.8 C), temperature source Oral, resp. rate 16, height '4\' 9"'$  (1.448 m), weight 59.8 kg, SpO2 93 %. Constitutional: No distress . Vital signs reviewed. HENT: Normocephalic.  Atraumatic. Eyes: EOMI. No discharge. Cardiovascular: No JVD. Respiratory: Normal effort.  + [place]. GI: Non-distended. Musc: No edema in extremities. Skin: No evidence of breakdown, no evidence of rash Neurologic: Alert Motor: RUE/RLE: Grossly 5/5 proximal distal LUE/LLE: Grossly 3-/5 proximal distal  Assessment/Plan: 1. Functional deficits secondary to RIght pontine infarct which require 3+ hours per day of interdisciplinary therapy in a comprehensive inpatient rehab setting.  Physiatrist is providing close team supervision and 24 hour management of active medical problems listed below.  Physiatrist and rehab team continue to assess barriers to discharge/monitor patient progress toward functional and medical goals  Care Tool:  Bathing    Body parts bathed by patient: Right arm, Left arm, Abdomen, Face, Left upper leg, Right upper leg   Body parts bathed by helper: Chest, Front perineal area, Left lower leg, Right lower leg, Face     Bathing assist Assist Level: Maximal Assistance - Patient 24 - 49%     Upper Body  Dressing/Undressing Upper body dressing   What is the patient wearing?: Hospital gown only Orthosis activity level: Performed by helper  Upper body assist Assist Level: Maximal Assistance - Patient 25 - 49%    Lower Body Dressing/Undressing Lower body dressing      What is the patient wearing?: Incontinence brief     Lower body assist Assist for lower body dressing: Maximal Assistance - Patient 25 - 49%     Toileting Toileting    Toileting assist Assist for toileting: Maximal Assistance - Patient 25 - 49%     Transfers Chair/bed transfer  Transfers assist  Chair/bed transfer activity did not occur: (squat pivot/lateral scoot)  Chair/bed transfer assist level: Moderate Assistance - Patient 50 - 74%     Locomotion Ambulation   Ambulation assist   Ambulation activity did not occur: Safety/medical concerns  Assist level: Minimal Assistance - Patient > 75% Assistive device: Walker-rolling Max distance: 20 ft   Walk 10 feet activity   Assist  Walk 10 feet activity did not occur: Safety/medical concerns  Assist level: Minimal Assistance - Patient > 75% Assistive device: Walker-rolling   Walk 50 feet activity   Assist Walk 50 feet with 2 turns activity did not occur: Safety/medical concerns         Walk 150 feet activity   Assist Walk 150 feet activity did not occur: Safety/medical concerns         Walk 10 feet on uneven surface  activity   Assist Walk  10 feet on uneven surfaces activity did not occur: Safety/medical concerns         Wheelchair     Assist Will patient use wheelchair at discharge?: Yes Type of Wheelchair: Manual    Wheelchair assist level: Dependent - Patient 0% Max wheelchair distance: 150 ft    Wheelchair 50 feet with 2 turns activity    Assist        Assist Level: Dependent - Patient 0%   Wheelchair 150 feet activity     Assist     Assist Level: Dependent - Patient 0%    Medical Problem List  and Plan: 1.Functional deficits and left hemiparesissecondary to right pontine infarct on 05/01/2019 Team conference today please see physician documentation under team conference tab, met with team face-to-face to discuss problems,progress, and goals. Formulized individual treatment plan based on medical history, underlying problem and comorbidities. EGD today Team conference today please see physician documentation under team conference tab, met with team face-to-face to discuss problems,progress, and goals. Formulized individual treatment plan based on medical history, underlying problem and comorbidities. 2. Antithrombotics: -DVT/anticoagulation:Pharmaceutical:Lovenox -antiplatelet therapy: ASA/Plavix X 3 weeks followed by ASA. 3.Headaches/Chronic back pain/Pain Management:Voltaren gel for hands, increased to 4 times daily -Gabapentin  100 mg HS. -  tramadol prn with local measuresincluding heat -Lidocain patches/heat/ice.  DC oxycodone and scheduled Tylenol with codeine 3 times daily, was on chronic opioids at home   Right wrist splint 4. Mood:LCSW to follow for evaluation and support. -antipsychotic agents: N/A 5. Neuropsych: This patientis notcapable of making decisions onherown behalf.   -some improvement  -probably underlying cognitive deficits at baseline  -neuropsych to see  -continue to support pt at bedside  6. Skin/Wound Care:LCSW tio follow for evaluation and support. 7. Fluids/Electrolytes/Nutrition:Monitor I/O.  D3 thins.  8. HTN: Monitor BP bid   Vitals:   05/24/19 0528 05/24/19 0743  BP: (!) 148/70 (!) 149/66  Pulse: 82 83  Resp: 17 16  Temp: 98 F (36.7 C) 98.3 F (36.8 C)  SpO2: 01% 65%  diastolic controlled minimal elevation of systolic has missed some meds due to NPO status 9. COPD./chronic hypoxemic respiratory failure: Question maintainence medications.  Supplemental oxygen  dependent. Added prn neb -OOB, IS 10. Pre renal azotemia resolved with hydration  Repeat BUN creatinine normal, GFR normal on 5/25 11. H/o depression: Continue cymbalta.  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  13. H/o B12 deficiency/Iron deficiency anemia: Hgb 8.4 at admission with mild leucocytosis.   Added iron supplement.  B12 supp  Critical hemoglobin of 5.9 on 5/25, improved after transfusion , EGD today 14. Dyslipidemia: Now on Lipitor.  15.  Episode of CP normal EKG, exam does show tenderness in the Left parasternal area, troponins neg, CXR showing expected atelectasis Likely MSK-Resolved 16.  RIght wrist synovitis OA Voltaren gel  LOS: 22 days A FACE TO FACE EVALUATION WAS PERFORMED  Charlett Blake 05/24/2019, 7:59 AM

## 2019-05-24 NOTE — Progress Notes (Signed)
Patient's upper endoscopy was well-tolerated, and shows focal erosive esophagitis with hemorrhagic change, the probable source of her recent bleeding.  Please see dictated report for details.  I suspect this was pill-induced esophagitis; she has distal esophageal rings which may have caused a pill to hang up.  The patient also had some mild erosive gastritis, consistent with her aspirin therapy.  Recommendations:  1.  Patient may resume diet (I have ordered a heart healthy diet for her).  To help prevent recurrent episodes of pill induced esophagitis, it is important for the patient to take her pills sitting fully upright, and with plenty of water.  2.  Patient appears to be at no risk for further bleeding from the upper GI tract, so okay for discharge home from GI standpoint at any time.  3.  Would continue pantoprazole 40 mg daily indefinitely (if she is going to remain on aspirin), as prophylaxis against aspirin induced gastritis.  (Would not use omeprazole if the patient is going back on clopidogrel, since there does appear to be an occasional drug interaction between those 2 medications.)  4.  In view of the favorable endoscopic findings, it is okay for the patient to resume clopidogrel if you wish for her to be on it.  5.  Because of the patient's multiple distal esophageal rings, I obtained esophageal biopsies to check for eosinophilic esophagitis (doubt).  I will call her with those results when available.  6.  No endoscopic follow-up or GI office follow-up necessary.  The patient does not have chronic dysphagia symptoms, so I do not think esophageal dilatation is needed.  7.  I will sign off.  Please call if questions, or if we can be of further assistance in this patient's care.  Cleotis Nipper, M.D. Pager [phone] If no answer or after 5 PM call [phone]

## 2019-05-24 NOTE — Anesthesia Preprocedure Evaluation (Addendum)
Anesthesia Evaluation  Patient identified by MRN, date of birth, ID band Patient awake    Reviewed: Allergy & Precautions, H&P , NPO status , Patient's Chart, lab work & pertinent test results  Airway Mallampati: III  TM Distance: >3 FB Neck ROM: Full    Dental no notable dental hx. (+) Teeth Intact, Dental Advisory Given   Pulmonary COPD,  oxygen dependent, former smoker,    Pulmonary exam normal breath sounds clear to auscultation       Cardiovascular hypertension, Pt. on medications  Rhythm:Regular Rate:Normal     Neuro/Psych Depression negative neurological ROS     GI/Hepatic negative GI ROS, Neg liver ROS,   Endo/Other  negative endocrine ROS  Renal/GU negative Renal ROS  negative genitourinary   Musculoskeletal   Abdominal   Peds  Hematology  (+) Blood dyscrasia, anemia ,   Anesthesia Other Findings   Reproductive/Obstetrics negative OB ROS                            Anesthesia Physical Anesthesia Plan  ASA: III  Anesthesia Plan: MAC   Post-op Pain Management:    Induction: Intravenous  PONV Risk Score and Plan: 2 and Propofol infusion and Treatment may vary due to age or medical condition  Airway Management Planned: Nasal Cannula  Additional Equipment:   Intra-op Plan:   Post-operative Plan:   Informed Consent: I have reviewed the patients History and Physical, chart, labs and discussed the procedure including the risks, benefits and alternatives for the proposed anesthesia with the patient or authorized representative who has indicated his/her understanding and acceptance.     Dental advisory given  Plan Discussed with: CRNA  Anesthesia Plan Comments:         Anesthesia Quick Evaluation

## 2019-05-24 NOTE — Transfer of Care (Signed)
Immediate Anesthesia Transfer of Care Note  Patient: [name]  Procedure(s) Performed: ESOPHAGOGASTRODUODENOSCOPY (EGD) WITH PROPOFOL (N/A ) BIOPSY  Patient Location: PACU Endoscopy  Anesthesia Type:MAC  Level of Consciousness: awake and patient cooperative  Airway & Oxygen Therapy: Patient Spontanous Breathing and Patient connected to nasal cannula oxygen  Post-op Assessment: Report given to RN and Post -op Vital signs reviewed and stable  Post vital signs: Reviewed and stable  Last Vitals:  Vitals Value Taken Time  BP    Temp    Pulse    Resp    SpO2      Last Pain:  Vitals:   05/24/19 1135  TempSrc: Oral  PainSc: 0-No pain      Patients Stated Pain Goal: 3 (69/48/54 6270)  Complications: No apparent anesthesia complications

## 2019-05-24 NOTE — Anesthesia Postprocedure Evaluation (Signed)
Anesthesia Post Note  Patient: SHINE SCROGHAM  Procedure(s) Performed: ESOPHAGOGASTRODUODENOSCOPY (EGD) WITH PROPOFOL (N/A ) BIOPSY     Patient location during evaluation: Endoscopy Anesthesia Type: MAC Level of consciousness: awake and alert Pain management: pain level controlled Vital Signs Assessment: post-procedure vital signs reviewed and stable Respiratory status: spontaneous breathing, nonlabored ventilation, respiratory function stable and patient connected to nasal cannula oxygen Cardiovascular status: stable and blood pressure returned to baseline Postop Assessment: no apparent nausea or vomiting Anesthetic complications: no    Last Vitals:  Vitals:   05/24/19 1244 05/24/19 1250  BP: (!) 131/58 (!) 143/58  Pulse: 86 84  Resp: 19 20  Temp: 36.6 C   SpO2: 99% 98%    Last Pain:  Vitals:   05/24/19 1244  TempSrc: Oral  PainSc: 0-No pain                 Maci Eickholt,W. EDMOND

## 2019-05-24 NOTE — Progress Notes (Signed)
Physical Therapy Session Note  Patient Details  Name: [name] MRN: [id number] Date of Birth: 1936/04/13  Today's Date: 05/24/2019 PT Missed Time: 85 Minutes Missed Time Reason: Patient fatigue  Short Term Goals: Week 3:  PT Short Term Goal 1 (Week 3): STG=LTG due to ELOS  Skilled Therapeutic Interventions/Progress Updates:    Pt supine in bed asleep upon PT arrival, pt arouses to verbal stimuli. Pt falling in and out of sleep during conversation regarding pt's procedure earlier today. Pt reports she is glad it went well. Therapist recommending bed level exercises this session, pt reports she would rather rest. Pt reports "I didn't sleep a wink last night. I was worried about the procedure." Pt missed 45 minutes of skilled therapy tx secondary to fatigue.  Therapy Documentation Precautions:  Precautions Precautions: Fall Precaution Comments: O2 dependent Required Braces or Orthoses: Other Brace Other Brace: R UE brace during therapy for increased functional use Restrictions Weight Bearing Restrictions: No    Therapy/Group: Individual Therapy  Netta Corrigan, PT, DPT 05/24/2019, 7:48 AM

## 2019-05-24 NOTE — Progress Notes (Signed)
Speech Language Pathology Daily Session Note  Patient Details  Name: [name] MRN: [id number] Date of Birth: [birth year]  Today's Date: 05/24/2019 SLP Individual Time: 1100-1115 SLP Individual Time Calculation (min): 15 min and Today's Date: 05/24/2019 SLP Missed Time: 15 Minutes Missed Time Reason: (off unit for procedure )  Short Term Goals: Week 3: SLP Short Term Goal 1 (Week 3): Patient will demonstrate intellectual awareness and identify 2 physical and 2 cognitive deficits with supervision A multimodal cues.  SLP Short Term Goal 2 (Week 3): Patient will demonstrate functional problem solving for basic tasks with Min A multimodal cues.  SLP Short Term Goal 3 (Week 3): Patient will demonstrate sustained attention to functional tasks for 20 minutes with Min A verbal cues for redirection.  SLP Short Term Goal 4 (Week 3): Pt will utilize compensatory memory aids to recall new and/or daily information with Min A verbal and questions cues SLP Short Term Goal 5 (Week 3): Pt will consume dys 3 and thin diet with mod I use of swallow strategies.  Skilled Therapeutic Interventions: Skilled treatment session focused on cognitive goals. Upon arrival, patient was awake but anxious about upcoming procedure. SLP facilitated session by providing Mod A verbal cues for intellectual awareness of deficits and goals of skilled PT/OT/ST intervention. SLP also facilitated session by providing Min-Mod A verbal cues for recall of events from previous therapy sessions. Patient missed 15 mins of session due to transport present to take patient to her procedure. Continue with current plan of care.      Pain No/Denies Pain   Therapy/Group: Individual Therapy  Kerron Sedano 05/24/2019, 12:49 PM

## 2019-05-24 NOTE — Progress Notes (Signed)
Physical Therapy Session Note  Patient Details  Name: [name] MRN: [id number] Date of Birth: [birth year]  Today's Date: 05/24/2019 PT Individual Time: 1005-1100 PT Individual Time Calculation (min): 55 min   Short Term Goals: Week 3:  PT Short Term Goal 1 (Week 3): STG=LTG due to ELOS  Skilled Therapeutic Interventions/Progress Updates:     Patient in bed upon PT arrival. Patient alert and agreeable to PT session. Patient expressed concern about her procedure later today, PT comforted to the patient and provided education about the procedure and patient appeared to be less anxious after. She stated that she did not want to be too tired before the procedure and that she did not want to get OOB again this morning, but she was agreeable to bed level exercises with PT.   Therapeutic Exercise: Patient performed the following exercises with verbal and tactile cues for proper technique. -B ankle pumps 2x20 -B heel slides 2x10 -B SLR 2x10 -B clam shells 2x10 -B glute sets 2x10 with 5 second hold -B isometric hip adduction 2x10 with 5 second hold  Patient in bed at end of session with breaks locked, bed alarm set, and all needs within reach. Educated patient on stroke recovery and types of strokes. Encouraged patient to get OOB as much as possible for increased strength and breath support.    Therapy Documentation Precautions:  Precautions Precautions: Fall Precaution Comments: O2 dependent Required Braces or Orthoses: Other Brace Other Brace: R UE brace during therapy for increased functional use Restrictions Weight Bearing Restrictions: No Vital Signs: Therapy Vitals Pulse Rate: 94 BP: (!) 150/75 Patient Position (if appropriate): Lying Oxygen Therapy SpO2: 97 % O2 Device: Nasal Cannula O2 Flow Rate (L/min): 2 L/min Pain: Pain Assessment Pain Scale: Faces Faces Pain Scale: Hurts little more Pain Type: Chronic pain Pain Location: Arm Pain Orientation: Right Pain  Descriptors / Indicators: Discomfort Pain Onset: With Activity Pain Intervention(s): RepositionedPatient reported 8/10 back and hand pain, stated she received pain medication from RN and topical cream on her hands prior to session. PT provided repositioning and distraction for pain interventions throughout session.     Therapy/Group: Individual Therapy  [name] PT, DPT  05/24/2019, 10:58 AM

## 2019-05-24 NOTE — Interval H&P Note (Signed)
History and Physical Interval Note:  05/24/2019 12:10 PM  [name]  has presented today for surgery, with the diagnosis of Melena and anemia.  The various methods of treatment have been discussed with the patient and family. After consideration of risks, benefits and other options for treatment, the patient has consented to  Procedure(s): ESOPHAGOGASTRODUODENOSCOPY (EGD) WITH PROPOFOL (N/A) as a surgical intervention.  The patient's history has been reviewed, patient examined, no change in status, stable for surgery.  I have reviewed the patient's chart and labs.  Questions were answered to the patient's satisfaction.     Youlanda Mighty [employer]

## 2019-05-24 NOTE — Anesthesia Procedure Notes (Signed)
Procedure Name: MAC Date/Time: 05/24/2019 12:15 PM Performed by: Lowella Dell, CRNA Pre-anesthesia Checklist: Patient identified, Emergency Drugs available, Suction available, Patient being monitored and Timeout performed Patient Re-evaluated:Patient Re-evaluated prior to induction Oxygen Delivery Method: Nasal cannula Induction Type: IV induction Placement Confirmation: positive ETCO2 Tube secured with: Tape Dental Injury: Teeth and Oropharynx as per pre-operative assessment

## 2019-05-25 ENCOUNTER — Encounter (HOSPITAL_COMMUNITY): Payer: Self-pay | Admitting: Gastroenterology

## 2019-05-25 ENCOUNTER — Ambulatory Visit (HOSPITAL_COMMUNITY): Payer: Medicare Other | Admitting: Physical Therapy

## 2019-05-25 ENCOUNTER — Inpatient Hospital Stay (HOSPITAL_COMMUNITY): Payer: Medicare Other | Admitting: Physical Therapy

## 2019-05-25 ENCOUNTER — Encounter (HOSPITAL_COMMUNITY): Payer: Medicare Other | Admitting: Speech Pathology

## 2019-05-25 ENCOUNTER — Encounter (HOSPITAL_COMMUNITY): Payer: Medicare Other | Admitting: Occupational Therapy

## 2019-05-25 MED ORDER — AMLODIPINE BESYLATE 5 MG PO TABS
5.0000 mg | ORAL_TABLET | Freq: Every day | ORAL | Status: DC
  Administered 2019-05-25 – 2019-05-26 (×2): 5 mg via ORAL
  Filled 2019-05-25 (×2): qty 1

## 2019-05-25 NOTE — Progress Notes (Signed)
Occupational Therapy Discharge Summary  Patient Details  Name: [name] MRN: [id number] Date of Birth: Jul 08, 1936  Today's Date: 05/25/2019 OT Individual Time: 1303-1406 OT Individual Time Calculation (min): 63 min   Session Note:  Pt completed transfers supine to sit and from bed to the wheelchair and bed to the 3:1 during session.  Her son [name] was present for education and was able to assist her with all of the stated transfers with use of the RW and gait belt.  He was also able to provide safe assist with min instructional cueing for supine to sit and sit to supine.  Educated him on her thumb spica splint and the optional wearing of this at night and when not having to use the RUE for self feeding or grooming tasks.  Finished session with pt in the wheelchair with call button and phone in reach and son present.    Patient has met 11 of 12 long term goals due to improved activity tolerance, improved balance, ability to compensate for deficits, functional use of  LEFT upper and LEFT lower extremity and improved coordination.  Patient to discharge at overall Mod Assist level.  Patient's care partner is independent to provide the necessary physical and cognitive assistance at discharge.    Reasons goals not met: Pt needs mod assist for sit to stand  Recommendation:  Patient will benefit from ongoing skilled OT services in home health setting to continue to advance functional skills in the area of BADL and Reduce care partner burden.  Feel pt will benefit from continued HHOT to further progress her strength, endurance, balance, and overall independence level with ADLs.  She currently needs overall mod assist for selfcare tasks secondary to arthritis, pain, and mild left hemiparesis.  Feel she will need continued daily participation in selfcare tasks as well as continued OT.  Recommend [place] aide as well to assist with bathing as son will likely not want to assist with this.   Equipment: tub bench    Reasons for discharge: treatment goals met and discharge from hospital  Patient/family agrees with progress made and goals achieved: Yes  OT Discharge Precautions/Restrictions  Precautions Precautions: Fall Precaution Comments: O2 dependent Other Brace: R UE brace during therapy for increased functional use Restrictions Weight Bearing Restrictions: No  Pain Pain Assessment Pain Scale: Faces Faces Pain Scale: Hurts a little bit Pain Type: Chronic pain Pain Location: Hand Pain Orientation: Right Pain Descriptors / Indicators: Discomfort Pain Onset: With Activity Pain Intervention(s): Medication (See eMAR);Ambulation/increased activity ADL ADL Eating: Set up Where Assessed-Eating: Wheelchair Grooming: Supervision/safety Where Assessed-Grooming: Wheelchair Upper Body Bathing: Supervision/safety Where Assessed-Upper Body Bathing: Edge of bed Lower Body Bathing: Maximal assistance Where Assessed-Lower Body Bathing: Edge of bed Upper Body Dressing: Supervision/safety Where Assessed-Upper Body Dressing: Wheelchair Lower Body Dressing: Maximal assistance Where Assessed-Lower Body Dressing: Edge of bed Toileting: Moderate assistance Where Assessed-Toileting: Bedside Commode Toilet Transfer: Moderate assistance Toilet Transfer Method: Stand pivot Science writer: Technical brewer: Facilities manager: Moderate assistance Social research officer, government Method: Radiographer, therapeutic: Gaffer Baseline Vision/History: Wears glasses Wears Glasses: Reading only Patient Visual Report: No change from baseline Vision Assessment?: No apparent visual deficits Perception  Perception: Within Functional Limits Praxis Praxis: Intact Cognition Overall Cognitive Status: History of cognitive impairments - at baseline Arousal/Alertness: Awake/alert Orientation Level: Oriented X4 Attention:  Sustained;Focused Focused Attention: Appears intact Sustained Attention: Impaired Sustained Attention Impairment: Verbal basic;Functional basic Selective Attention: Impaired Selective  Attention Impairment: Functional basic Alternating Attention: Impaired Alternating Attention Impairment: Functional basic Memory: Impaired Awareness: Impaired Awareness Impairment: Intellectual impairment Problem Solving: Impaired Problem Solving Impairment: Functional basic;Functional complex Behaviors: Perseveration;Confabulation Safety/Judgment: Impaired Comments: Pt still with internal distraction secondary to conversation.   Sensation Sensation Light Touch: Appears Intact Hot/Cold: Appears Intact Proprioception: Appears Intact Stereognosis: Appears Intact Additional Comments: Sensation intact in BUEs when testing Coordination Gross Motor Movements are Fluid and Coordinated: No Fine Motor Movements are Fluid and Coordinated: No Coordination and Movement Description: Pt with Brunnstrum stage VI movement in the left arm and hand.  She uses it functionally with supervision during selfcare tasks. Increased pain in the right wrist and CMC joint of the thumb with functional use and weightbearing tasks.   Motor  Motor Motor: Hemiplegia Motor - Discharge Observations: Still with mild hemiparesis in the LUE and LLE but much improved since eval. Mobility  Bed Mobility Bed Mobility: Rolling Right;Rolling Left;Supine to Sit Rolling Right: Moderate Assistance - Patient 50-74% Rolling Left: Moderate Assistance - Patient 50-74% Supine to Sit: Moderate Assistance - Patient 50-74% Sit to Supine: Moderate Assistance - Patient 50-74% Transfers Sit to Stand: Moderate Assistance - Patient 50-74% Stand to Sit: Moderate Assistance - Patient 50-74%  Trunk/Postural Assessment  Cervical Assessment Cervical Assessment: Exceptions to WFL(cervical flexion and protraction) Thoracic Assessment Thoracic Assessment:  Exceptions to WFL(thoracic kyphosis in sitting) Lumbar Assessment Lumbar Assessment: Exceptions to WFL(increased posterior pelvic tilt in sitting)  Balance Balance Balance Assessed: Yes Static Sitting Balance Static Sitting - Balance Support: Feet supported Static Sitting - Level of Assistance: 5: Stand by assistance Dynamic Sitting Balance Dynamic Sitting - Balance Support: Feet supported;During functional activity Dynamic Sitting - Level of Assistance: 5: Stand by assistance Static Standing Balance Static Standing - Balance Support: During functional activity;Bilateral upper extremity supported Static Standing - Level of Assistance: 4: Min assist Dynamic Standing Balance Dynamic Standing - Balance Support: During functional activity Dynamic Standing - Level of Assistance: 3: Mod assist Extremity/Trunk Assessment RUE Assessment RUE Assessment: Exceptions to [facility] General Strength Comments: shoulder AROM 0-80 degrees with testing, elbow flexion/extension 3+/5.  Gross digit flexion at approximately 85% of normal with arthritic changes noted at the [facility] and IP joints of the thumb.  Grip strength 3/5 LUE Assessment LUE Assessment: Exceptions to [facility] Passive Range of Motion (PROM) Comments: PROM WFLS for all joints General Strength Comments: Pt currently Brunnstrum stage VI movement in the right arm and hand.  She demonstrates isolated shoulder flexion to 90 degrees with full AROM elbow flexion and extension.  She is able to exhibit full grasp and release with strength at 3+/5.    Eddye Broxterman OTR/L 05/25/2019, 4:21 PM

## 2019-05-25 NOTE — Op Note (Signed)
[facility] Patient Name: [name] Procedure Date : 05/24/2019 MRN: [id number] Attending MD: Ronald Lobo , MD Date of Birth: [birth year] CSN: [id number] Age: 83 Admit Type: Inpatient Procedure:                Upper GI endoscopy Indications:              Acute post hemorrhagic anemia, Melena Providers:                Ronald Lobo, MD, Grace Isaac, RN, Marguerita Merles, Technician, Charolette Child, Technician Referring MD:              Medicines:                Monitored Anesthesia Care Complications:            No immediate complications. Estimated Blood Loss:     Estimated blood loss was minimal. Procedure:                Pre-Anesthesia Assessment:                           - Prior to the procedure, a History and Physical                            was performed, and patient medications and                            allergies were reviewed. The patient's tolerance of                            previous anesthesia was also reviewed. The risks                            and benefits of the procedure and the sedation                            options and risks were discussed with the patient.                            All questions were answered, and informed consent                            was obtained. Prior Anticoagulants: The patient has                            taken Plavix (clopidogrel), last dose was 3 days                            prior to procedure. ASA Grade Assessment: III - A                            patient with severe systemic disease. After  reviewing the risks and benefits, the patient was                            deemed in satisfactory condition to undergo the                            procedure.                           After obtaining informed consent, the endoscope was                            passed under direct vision. Throughout the                            procedure, the  patient's blood pressure, pulse, and                            oxygen saturations were monitored continuously. The                            GIF-H190 ([id number]) Olympus gastroscope was                            introduced through the mouth, and advanced to the                            second part of duodenum. The upper GI endoscopy was                            accomplished without difficulty. The patient                            tolerated the procedure well. Scope In: Scope Out: Findings:      The larynx was normal.      Distal linear focal esophagitis with erosive and hemorrhagic change was       present. It extended over the course of approximately 2 cm, ending about       2-3 cm proximal to the Schatzki's ring at the squamo-columnar junction.       No associated mass. No active bleeding or visible vessel, but focal       hemorrhagic mucosa could reflect a stigma of recent hemorrhage.       Consistent with pill-induced focally erosive esophagitis: LA Grade B       (one or more mucosal breaks greater than 5 mm, not extending between the       tops of two mucosal folds) esophagitis with no bleeding.      In addition to a Schatzki's ring at the squamo-columnar junction, there       were several "stacked" rings in the distal esophagus, raising the       question of eosinophilic esophagitis.      There is no endoscopic evidence of Barrett's esophagus, varices or mass       in the entire esophagus. Biopsies were obtained from the proximal and       distal esophagus with cold  forceps for histology of suspected       eosinophilic esophagitis. Estimated blood loss was minimal.      A low-grade of narrowing, mild Schatzki ring was found at the       gastroesophageal junction.      A 2 cm hiatal hernia was present.      Patchy mild inflammation characterized by erosions, erythema and       friability was found in the gastric antrum.      The exam of the stomach was otherwise normal. No  blood or coffee grounds       present in the stomach.      The cardia and gastric fundus were normal on retroflexion.      The examined duodenum was normal. Impression:               - Normal larynx.                           - LA Grade B acute esophagitis, suggestive of                            pill-induced esophagitis given its appearance and                            location.                           - Low-grade of narrowing Schatzki ring, which might                            have caused a transient pill impaction, leading to                            the above-mentioned focal esophagitis. This is the                            most likely source of the patient's recent bleeding.                           - 2 cm hiatal hernia.                           - Erosive gastritis, consistent with aspirin                            exposure.                           - Normal examined duodenum. Recommendation:           - Await pathology results.                           - Continue present medications. Could restart                            Plavix if desired.                           -  Would continue PPI therapy indefinitely, if the                            patient is going to remain on aspirin or other                            ulcerogenic medications.                           - Patient needs to swallow pills with plenty of                            water while sitting fully upright. Procedure Code(s):        --- Professional ---                           [phone], Esophagogastroduodenoscopy, flexible,                            transoral; with biopsy, single or multiple Diagnosis Code(s):        --- Professional ---                           K20.9, Esophagitis, unspecified                           K22.2, Esophageal obstruction                           K29.60, Other gastritis without bleeding                           K92.1, Melena (includes Hematochezia) CPT copyright 2019  American Medical Association. All rights reserved. The codes documented in this report are preliminary and upon coder review may  be revised to meet current compliance requirements. Ronald Lobo, MD 05/24/2019 1:00:04 PM This report has been signed electronically. Number of Addenda: 0

## 2019-05-25 NOTE — Progress Notes (Addendum)
[facility] PHYSICAL MEDICINE & REHABILITATION PROGRESS NOTE   Subjective/Complaints: Erosive esophagitis noted on EGD, pill related , need to get pt OOB in chair to take pills with 8oz H20  Discussed arthritis pain as well as EGD result ROS: Denies CP, SOB, N/V/D  Objective:   No results found. Recent Labs    05/22/19 0914 05/23/19 0515  WBC 8.2 10.0  HGB 5.9* 10.3*  HCT 20.0* 32.2*  PLT 572* 522*   No results for input(s): NA, K, CL, CO2, GLUCOSE, BUN, CREATININE, CALCIUM in the last 72 hours. No intake or output data in the 24 hours ending 05/25/19 0730   Physical Exam: Vital Signs Blood pressure (!) 156/100, pulse 88, temperature 98 F (36.7 C), resp. rate 16, height 4\' 9"  (1.448 m), weight 59.8 kg, SpO2 100 %. Constitutional: No distress . Vital signs reviewed. HENT: Normocephalic.  Atraumatic. Eyes: EOMI. No discharge. Cardiovascular: No JVD. Respiratory: Normal effort.  + [place]. GI: Non-distended. Musc: No edema in extremities. Skin: No evidence of breakdown, no evidence of rash Neurologic: Alert Motor: RUE/RLE: Grossly 5/5 proximal distal LUE/LLE: Grossly 3-/5 proximal distal  Assessment/Plan: 1. Functional deficits secondary to RIght pontine infarct which require 3+ hours per day of interdisciplinary therapy in a comprehensive inpatient rehab setting.  Physiatrist is providing close team supervision and 24 hour management of active medical problems listed below.  Physiatrist and rehab team continue to assess barriers to discharge/monitor patient progress toward functional and medical goals  Care Tool:  Bathing    Body parts bathed by patient: Right arm, Left arm, Abdomen, Face   Body parts bathed by helper: Front perineal area, Buttocks, Chest Body parts n/a: Right upper leg, Left upper leg, Right lower leg, Left lower leg   Bathing assist Assist Level: Moderate Assistance - Patient 50 - 74%     Upper Body Dressing/Undressing Upper body dressing   What  is the patient wearing?: Hospital gown only Orthosis activity level: Performed by helper  Upper body assist Assist Level: Maximal Assistance - Patient 25 - 49%    Lower Body Dressing/Undressing Lower body dressing      What is the patient wearing?: Incontinence brief     Lower body assist Assist for lower body dressing: Maximal Assistance - Patient 25 - 49%     Toileting Toileting    Toileting assist Assist for toileting: Maximal Assistance - Patient 25 - 49%     Transfers Chair/bed transfer  Transfers assist  Chair/bed transfer activity did not occur: (squat pivot/lateral scoot)  Chair/bed transfer assist level: Dependent - mechanical lift(Stedy)     Locomotion Ambulation   Ambulation assist   Ambulation activity did not occur: Safety/medical concerns  Assist level: Minimal Assistance - Patient > 75% Assistive device: Walker-rolling Max distance: 20 ft   Walk 10 feet activity   Assist  Walk 10 feet activity did not occur: Safety/medical concerns  Assist level: Minimal Assistance - Patient > 75% Assistive device: Walker-rolling   Walk 50 feet activity   Assist Walk 50 feet with 2 turns activity did not occur: Safety/medical concerns         Walk 150 feet activity   Assist Walk 150 feet activity did not occur: Safety/medical concerns         Walk 10 feet on uneven surface  activity   Assist Walk 10 feet on uneven surfaces activity did not occur: Safety/medical concerns         Wheelchair     Assist Will patient  use wheelchair at discharge?: Yes Type of Wheelchair: Manual    Wheelchair assist level: Dependent - Patient 0% Max wheelchair distance: 150 ft    Wheelchair 50 feet with 2 turns activity    Assist        Assist Level: Dependent - Patient 0%   Wheelchair 150 feet activity     Assist     Assist Level: Dependent - Patient 0%    Medical Problem List and Plan: 1.Functional deficits and left  hemiparesissecondary to right pontine infarct on 05/01/2019 [facility] for d/c per GI  D/C in am after family training today   2. Antithrombotics: -DVT/anticoagulation:Pharmaceutical:Lovenox -antiplatelet therapy: ASA/Plavix X 3 weeks followed by ASA., may d/c home on ASA alone  3.Headaches/Chronic back pain/Pain Management:Voltaren gel for hands, increased to 4 times daily -Gabapentin  100 mg HS. -  tramadol prn with local measuresincluding heat -Lidocain patches/heat/ice.  DC oxycodone and scheduled Tylenol with codeine 3 times daily, was on chronic opioids at home   Right wrist splint 4. Mood:LCSW to follow for evaluation and support. -antipsychotic agents: N/A 5. Neuropsych: This patientis notcapable of making decisions onherown behalf.   -some improvement  -probably underlying cognitive deficits at baseline  -neuropsych to see  -continue to support pt at bedside  6. Skin/Wound Care:LCSW tio follow for evaluation and support. 7. Fluids/Electrolytes/Nutrition:Monitor I/O.  D3 thins.  8. HTN: Monitor BP bid   Vitals:   05/24/19 1930 05/25/19 0545  BP: (!) 143/48 (!) 156/100  Pulse: 88   Resp: 17 16  Temp: 98.3 F (36.8 C) 98 F (36.7 C)  SpO2: 100%   elevated this am prior to meds 9. COPD./chronic hypoxemic respiratory failure: Question maintainence medications.  Supplemental oxygen dependent. Added prn neb -OOB, IS 10. Pre renal azotemia resolved with hydration  Repeat BUN creatinine normal, GFR normal on 5/25 11. H/o depression: Continue cymbalta.  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  13. H/o B12 deficiency/Iron deficiency anemia: Hgb 8.4 at admission with mild leucocytosis.   Added iron supplement.  B12 supp  Critical hemoglobin of 5.9 on 5/25, improved after transfusion , EGD showing erosive esophagitis  14. Dyslipidemia: Now on Lipitor.  15.  Episode of CP  normal EKG, exam does show tenderness in the Left parasternal area, troponins neg, CXR showing expected atelectasis Likely MSK-Resolved 16.  RIght wrist synovitis OA Voltaren gel  LOS: 23 days A FACE TO [place] E Kirst[place]ins 05/25/2019, 7:30 AM

## 2019-05-25 NOTE — Progress Notes (Signed)
Physical Therapy Discharge Summary  Patient Details  Name: [name] MRN: [id number] Date of Birth: 03/02/1936  Today's Date: 05/25/2019 PT Individual Time: 6226-3335 and 4562-5638 PT Individual Time Calculation (min): 31 min and 62 min    Patient has met 5 of 8 long term goals due to improved activity tolerance, improved balance, improved postural control, increased strength, functional use of  left upper extremity and left lower extremity, improved attention and improved awareness.  Patient to discharge at an ambulatory level [place].   Patient's care partner is independent to provide the necessary physical assistance at discharge.  Reasons goals not met: Patient continues to require mod assist for bed mobility. Car transfer and stair goal discontinued as they are no longer applicable as pt is being transported home via ambulance. Pt's son reports that patient has no need to leave the home as her MD comes to the house.  Recommendation:  Patient will benefit from ongoing skilled PT services in home health setting to continue to advance safe functional mobility, address ongoing impairments in activity tolerance, B LE strength, standing balance, trunk control, bed mobility, transfers, gait, and minimize fall risk.  Equipment: Hemi-height 18x18 w/c  Reasons for discharge: treatment goals met and discharge from hospital  Patient/family agrees with progress made and goals achieved: Yes   Skilled Therapeutic Interventions/Progress Updates:  Session 1: Patient received supine in bed and agreeable to therapy session. Patient reports she was talking on the phone with her son, [name], about discharge planning and pt states she is scared to go home. Therapist questioned why and patient states "I'm afraid I'm going to go home and die in my sleep and [name] will have to deal with it." Patient became tearful and further reports that her mother passed away in a rehab facility the night before she was  supposed to discharge home. Therapist provided emotional support and encouraged patient regarding her current mobility level and need for assistance at home - patient with improved spirits. Pt on 3L of O2 via nasal cannula throughout session. Supine>sit with mod assist for trunk upright and mod assist for scooting hips to EOB. Sit>stand EOB>RW with min assist for lifting and balance. Donned pants with mod/max assist for clothing management. Ambulated ~5f using RW with min assist for balance. Pt noted to be incontinent of bladder and nursing staff notified. Pt left sitting in w/c with needs in reach, seat belt alarm on, and lines intact.  Session 2: Patient received sitting in w/c with her son, MRonalee [name] present for family education and pt agreeable to therapy session. Pt maintained on 3L of O2 via nasal cannula throughout session. Per PT's discussion with OT patient/family requires squat pivot transfer education/training in event pt is fatigued and unable to perform full stand pivot transfer. PT reinforced education on pt's family using gait belt for all transfers and standing/ambulating tasks for increased pt safety. Therapist educated pt's family on w/c parts. Pt performed squat pivot transfer w/c>EOB with therapist providing mod assist for lifting/pivoting hips to provide visual demonstration and verbal education throughout for proper w/c transfer set-up, family positioning to provide assist, and cuing for pt to perform transfers. Pt performed EOB<>w/c transfer x 3 with mod/max assist for lifting/pivoting hips from family and therapist providing mod cuing for proper positioning and assist throughout. Transported to/from gym in w/c. Performed sit>stand w/c>RW with varying CGA/min assist for balance/lifting with therapist educating pt's son and him demonstrating understanding. Pt ambulated ~218fusing RW with CGA for balance until  end of ambulation when pt suddenly reached for chair and reported she was "blacking  out" with therapist providing mod assist to pivot pt's hips into seat. Vitals assessed in sitting: BP 154/76 (MAP 98) HR 106bpm and SpO2 99% - reassessed in standing: BP 122/73 (MAP 88) HR 116bpm with pt denying sympoms. Ambulated ~75f using RW with CGA/min assist from pt's son with therapist providing mod cuing throughout for proper positioning for assist. Reassessed vitals at end of that walk BP 144/71 (MAP 89) HR 120bpm. Pt denies any further lightheaded/black out sensations for remainder of session. Ambulated ~135fusing RW with pt's son providing CGA/min assist for balance for increased practice and min cuing from therapist for proper positioning for assist. Stand pivot transfer chair>w/c using RW with min assist from pt's son with therapist providing mod cuing for sequencing and family positioning to provide assist. Transported back to room in w/c and pt requesting to use bathroom. Pt's son set-up for w/c>BSC transfer; however, removed pt's RW and performed squat pivot transfer w/c>BSC with max assist from pt's son and min assist from therapist. Therapist reinforcement education that pt needs RW for all standing activities for increased safety. Pt continent of small BM. Pt's son set-up for BSC>EOB transfer and again pt's son removed RW from in front of pt when she was going to initiate sit>stand - therapist corrected pt's son in the moment and reinforced education that pt needs RW for standing balance. Pt performed sit>stand with min assist from pt's son and pt performed peri-care with max assist from therapist for cleanliness while pt's son provided CGA for standing balance. Pt and son performed LB clothing management with close supervisoin from therapist and min/mod cuing for safety. Pt performed stand pivot transfer BSC>EOB using RW with min assist from pt's son. Therapist again reinforced importance of using RW for all standing tasks at home. Pt's son verbalizes confidence with assisting patient at home  and has no questions/concerns for PT. Pt left sitting on EOB with pt's son and Social Worker (JSonia Ballerpresent and nursing staff notified to assist pt to supine with son's assistance for increased practice.  PT Discharge Precautions/Restrictions Precautions Precautions: Fall Precaution Comments: O2 dependent Required Braces or Orthoses: Other Brace Other Brace: R UE brace during therapy for increased functional use Restrictions Weight Bearing Restrictions: No Pain Pain Assessment Pain Scale: Faces Faces Pain Scale: Hurts a little bit Pain Type: Chronic pain Pain Location: Hand Pain Orientation: Right Pain Descriptors / Indicators: Discomfort Pain Onset: With Activity Pain Intervention(s): Medication (See eMAR);Ambulation/increased activity Vision/Perception  Perception Perception: Within Functional Limits Praxis Praxis: Intact  Cognition Overall Cognitive Status: History of cognitive impairments - at baseline Arousal/Alertness: Awake/alert Orientation Level: Oriented X4 Attention: Focused;Sustained Focused Attention: Appears intact Sustained Attention: Impaired Sustained Attention Impairment: Verbal basic;Functional basic Safety/Judgment: Impaired Comments: Continues to be easily distracted and unable to state the need for RW with standing activities Sensation Sensation Light Touch: Impaired by gross assessment Central sensation comments: decreased sensation on plantar surface of R foot compared to L  Light Touch Impaired Details: Impaired RLE Hot/Cold: Not tested Stereognosis: Not tested Coordination Gross Motor Movements are Fluid and Coordinated: No Coordination and Movement Description: Continues to demonstrate impaired gross motor movements due to generalized weakness Motor  Motor Motor: Other (comment);Hemiplegia Motor - Discharge Observations: Continues to demonstrate slight strength impairments in L hemibody compared to R; however, also has generalized weakness   Mobility Bed Mobility Supine to Sit: Moderate Assistance - Patient 50-74% Transfers Transfers: Sit  to Stand;Stand to Sit;Stand Pivot Transfers Sit to Stand: Minimal Assistance - Patient > 75% Stand to Sit: Minimal Assistance - Patient > 75% Stand Pivot Transfers: Minimal Assistance - Patient > 75% Stand Pivot Transfer Details: Verbal cues for safe use of DME/AE;Verbal cues for technique;Verbal cues for gait pattern;Visual cues/gestures for sequencing;Verbal cues for sequencing Transfer (Assistive device): Rolling walker Locomotion  Gait Ambulation: Yes Gait Assistance: Minimal Assistance - Patient > 75% Gait Distance (Feet): 20 Feet Assistive device: Rolling walker Gait Assistance Details: Verbal cues for sequencing;Verbal cues for safe use of DME/AE;Verbal cues for gait pattern;Verbal cues for technique;Visual cues/gestures for sequencing;Visual cues for safe use of DME/AE;Tactile cues for weight shifting;Tactile cues for sequencing Gait Gait: Yes Gait Pattern: Impaired Gait Pattern: Decreased step length - right;Decreased step length - left;Decreased stride length;Decreased hip/knee flexion - right;Decreased hip/knee flexion - left;Decreased dorsiflexion - left;Decreased dorsiflexion - right;Narrow base of support;Trunk flexed Stairs / Additional Locomotion Stairs: No Wheelchair Mobility Wheelchair Mobility: No  Trunk/Postural Assessment  Cervical Assessment Cervical Assessment: Exceptions to WFL(forward head) Thoracic Assessment Thoracic Assessment: Exceptions to WFL(thoracic kyphosis) Lumbar Assessment Lumbar Assessment: Exceptions to WFL(posterior pelvic tilt) Postural Control Postural Control: Deficits on evaluation(requires B UE support on RW for standing balance) Righting Reactions: impaired Protective Responses: impaired Postural Limitations: impaired  Balance Balance Balance Assessed: Yes Static Sitting Balance Static Sitting - Balance Support: Feet  supported Static Sitting - Level of Assistance: 5: Stand by assistance Dynamic Sitting Balance Dynamic Sitting - Balance Support: Feet supported;During functional activity Dynamic Sitting - Level of Assistance: 5: Stand by assistance Static Standing Balance Static Standing - Balance Support: During functional activity;Bilateral upper extremity supported Static Standing - Level of Assistance: 4: Min assist Dynamic Standing Balance Dynamic Standing - Balance Support: During functional activity;Bilateral upper extremity supported Dynamic Standing - Level of Assistance: 4: Min assist Extremity Assessment      RLE Assessment RLE Assessment: Exceptions to [facility] General Strength Comments: Grossly 3+/5 to 4/5 throughout LLE Assessment LLE Assessment: Exceptions to [facility] General Strength Comments: Grossly 3/5 to 3+/5 throughout    Tawana Scale, PT, DPT 05/25/2019, 7:51 AM

## 2019-05-25 NOTE — Progress Notes (Signed)
Speech Language Pathology Discharge Summary  Patient Details  Name: [name] MRN: [id number] Date of Birth: 08-Dec-1936  Today's Date: 05/25/2019 SLP Individual Time: 1400-1433 SLP Individual Time Calculation (min): 33 min   Skilled Therapeutic Interventions:  Skilled treatment session focused on completing education with pt and her son. Education provided on current cognitive abilities which at this point are considered baseline. Son will provide help with medication and money management. At this point, no further follow up ST services are indicated.      Patient has met 7 of 7 long term goals.  Patient to discharge at overall Min;Mod level.    Clinical Impression/Discharge Summary:   Pt has met all of her LTGs and her cognitive function is considered to be baseline. All education completed and all questions answered at this time.   Care Partner:  Caregiver Able to Provide Assistance: Yes  Type of Caregiver Assistance: Physical;Cognitive  Recommendation:  24 hour supervision/assistance      Equipment:   N/A Reasons for discharge: Discharged from hospital   Patient/Family Agrees with Progress Made and Goals Achieved: Yes    Nesiah Jump 05/25/2019, 2:41 PM

## 2019-05-25 NOTE — Plan of Care (Signed)
  Problem: Consults Goal: RH STROKE PATIENT EDUCATION Description See Patient Education module for education specifics  Outcome: Progressing Goal: Nutrition Consult-if indicated Outcome: Progressing   Problem: RH BOWEL ELIMINATION Goal: RH STG MANAGE BOWEL WITH ASSISTANCE Description STG Manage Bowel with Max Assistance.  Outcome: Progressing Goal: RH STG MANAGE BOWEL W/MEDICATION W/ASSISTANCE Description STG Manage Bowel with Medication with max Assistance.  Outcome: Progressing   Problem: RH BLADDER ELIMINATION Goal: RH STG MANAGE BLADDER WITH ASSISTANCE Description STG Manage Bladder With max Assistance  Outcome: Progressing   Problem: RH SKIN INTEGRITY Goal: RH STG SKIN FREE OF INFECTION/BREAKDOWN Description Skin to remain free from breakdown while on rehab with max assist.  Outcome: Progressing Goal: RH STG MAINTAIN SKIN INTEGRITY WITH ASSISTANCE Description STG Maintain Skin Integrity With max Assistance and following following a turning and rotation schedule.  Outcome: Progressing Goal: RH STG ABLE TO PERFORM INCISION/WOUND CARE W/ASSISTANCE Description STG Able To Perform Wound/Skin breakdown Care With max Assistance.  Outcome: Progressing   Problem: RH SAFETY Goal: RH STG ADHERE TO SAFETY PRECAUTIONS W/ASSISTANCE/DEVICE Description STG Adhere to Safety Precautions With max Assistance and using appropriate safety and assistive Devices.  Outcome: Progressing   Problem: RH PAIN MANAGEMENT Goal: RH STG PAIN MANAGED AT OR BELOW PT'S PAIN GOAL Description <4 on a 0-10 pain scale.  Outcome: Progressing   Problem: RH KNOWLEDGE DEFICIT Goal: RH STG INCREASE KNOWLEDGE OF HYPERTENSION Description Patient to demonstrate knowledge on HTN medications, BP parameters, and follow-up care with the MD post discharge with mod assist from rehab staff.  Outcome: Progressing Goal: RH STG INCREASE KNOWLEDGE OF STROKE PROPHYLAXIS Description Patient to demonstrate knowledge  of Stroke prevention medications, dietary restrictions, and follow-up care with the MD post discharge with mod assist from rehab staff.  Outcome: Progressing

## 2019-05-25 NOTE — Progress Notes (Signed)
Patient ID: [name], female   DOB: Sep 24, 1936, 83 y.o.   MRN: [id number]      Diagnosis codes:    I63.9; I69.354  Height:     4'9"        Weight:     59.8kgs/131lbs      Patient suffers from right pontine infarct with left hemiparesis which impairs her ability to perform daily activities like grooming, dressing, feeding, toileing in the home.  A cane or walker will not resolve issue with performing activities of daily living.  A wheelchair will allow patient to safely perform daily activities.  Patient is not able to propel herself in the home using a standard weight wheelchair due to arm weakness and endurance.  Patient can self propel in the lightweight wheelchair.

## 2019-05-26 ENCOUNTER — Other Ambulatory Visit: Payer: Self-pay | Admitting: Physical Medicine and Rehabilitation

## 2019-05-26 MED ORDER — ATORVASTATIN CALCIUM 20 MG PO TABS: 20.0000 mg | ORAL_TABLET | Freq: Every day | ORAL | 1 refills | Status: DC

## 2019-05-26 MED ORDER — PANTOPRAZOLE SODIUM 40 MG PO TBEC: 40.0000 mg | DELAYED_RELEASE_TABLET | Freq: Every day | ORAL | 0 refills | Status: DC

## 2019-05-26 MED ORDER — MUSCLE RUB 10-15 % EX CREA: 1.0000 "application " | TOPICAL_CREAM | Freq: Two times a day (BID) | CUTANEOUS | 0 refills | Status: DC | PRN

## 2019-05-26 MED ORDER — DULOXETINE HCL 30 MG PO CPEP: 60.0000 mg | ORAL_CAPSULE | Freq: Every day | ORAL | Status: DC

## 2019-05-26 MED ORDER — GABAPENTIN 100 MG PO CAPS: 100.0000 mg | ORAL_CAPSULE | Freq: Every day | ORAL | 1 refills | Status: DC

## 2019-05-26 MED ORDER — CYANOCOBALAMIN 1000 MCG PO TABS: 1000.0000 ug | ORAL_TABLET | Freq: Every day | ORAL | 0 refills | Status: DC

## 2019-05-26 MED ORDER — AMLODIPINE BESYLATE 5 MG PO TABS: 5.0000 mg | ORAL_TABLET | Freq: Every day | ORAL | 1 refills | Status: DC

## 2019-05-26 MED ORDER — ACETAMINOPHEN 500 MG PO TABS: 500.0000 mg | ORAL_TABLET | Freq: Three times a day (TID) | ORAL | 0 refills | Status: DC

## 2019-05-26 MED ORDER — ACETAMINOPHEN-CODEINE #3 300-30 MG PO TABS: 1.0000 | ORAL_TABLET | Freq: Two times a day (BID) | ORAL | 0 refills | Status: DC | PRN

## 2019-05-26 MED ORDER — PANTOPRAZOLE SODIUM 40 MG PO TBEC: 40.0000 mg | DELAYED_RELEASE_TABLET | Freq: Two times a day (BID) | ORAL | 0 refills | Status: DC

## 2019-05-26 MED ORDER — SENNOSIDES-DOCUSATE SODIUM 8.6-50 MG PO TABS: 2.0000 | ORAL_TABLET | Freq: Two times a day (BID) | ORAL | 1 refills | Status: DC

## 2019-05-26 NOTE — Progress Notes (Signed)
Discharged via ambulance. Vitals taken. Patient without questions, excited to be going home. Scheduled meds-protonix and neurontin given prior to discharge. Refused senna s and voltaren gel. Patrici Ranks A

## 2019-05-26 NOTE — Progress Notes (Signed)
Social Work Patient ID: [name], female   DOB: 1936/08/08, 83 y.o.   MRN: [id number]\  CSW met with pt and son to update them on team conference discussion and to talk about d/c.  Son came for family education on 05-25-19 and feels prepared to take care of pt at home.  Pt will go home via non emergent ambulance and DME will be delivered to the home.  Pt will have [place] for continued therapies.  Pt is excited to go home, but will miss CIR staff.  She is very appreciative of care received.

## 2019-05-26 NOTE — Progress Notes (Signed)
[place] PHYSICAL MEDICINE & REHABILITATION PROGRESS NOTE   Subjective/Complaints: No issues overnite , discussed wrist  Pain (OA) .  Discussed 1 mo supply of pain meds than to be resumed by PCP Discussed diclofenac now OTC ROS: Denies CP, SOB, N/V/D  Objective:   No results found. No results for input(s): WBC, HGB, HCT, PLT in the last 72 hours. No results for input(s): NA, K, CL, CO2, GLUCOSE, BUN, CREATININE, CALCIUM in the last 72 hours.  Intake/Output Summary (Last 24 hours) at 05/26/2019 0726 Last data filed at 05/25/2019 1300 Gross per 24 hour  Intake 120 ml  Output -  Net 120 ml     Physical Exam: Vital Signs Blood pressure (!) 123/53, pulse 81, temperature 98.1 F (36.7 C), temperature source Oral, resp. rate 18, height 4\' 9"  (1.448 m), weight 56.2 kg, SpO2 100 %. Constitutional: No distress . Vital signs reviewed. HENT: Normocephalic.  Atraumatic. Eyes: EOMI. No discharge. Cardiovascular: No JVD. Respiratory: Normal effort.  + [place]. GI: Non-distended. Musc: No edema in extremities. Skin: No evidence of breakdown, no evidence of rash Neurologic: Alert Motor: RUE/RLE: Grossly 5/5 proximal distal LUE/LLE: Grossly 3-/5 proximal distal  Assessment/Plan: 1. Functional deficits secondary to RIght pontine infarct Stable for D/C today F/u PCP in 3-4 weeks F/u PM&R 2 weeks See D/C summary See D/C instructions Care Tool:  Bathing    Body parts bathed by patient: Right arm, Left arm, Chest, Abdomen, Face, Left upper leg, Right upper leg   Body parts bathed by helper: Front perineal area, Buttocks, Right lower leg, Left lower leg Body parts n/a: Right upper leg, Left upper leg, Right lower leg, Left lower leg   Bathing assist Assist Level: Moderate Assistance - Patient 50 - 74%     Upper Body Dressing/Undressing Upper body dressing   What is the patient wearing?: Pull over shirt Orthosis activity level: Performed by helper  Upper body assist Assist Level:  Supervision/Verbal cueing    Lower Body Dressing/Undressing Lower body dressing      What is the patient wearing?: Incontinence brief     Lower body assist Assist for lower body dressing: Maximal Assistance - Patient 25 - 49%     Toileting Toileting    Toileting assist Assist for toileting: Moderate Assistance - Patient 50 - 74%     Transfers Chair/bed transfer  Transfers assist  Chair/bed transfer activity did not occur: (squat pivot/lateral scoot)  Chair/bed transfer assist level: Moderate Assistance - Patient 50 - 74%     Locomotion Ambulation   Ambulation assist   Ambulation activity did not occur: Safety/medical concerns  Assist level: Minimal Assistance - Patient > 75% Assistive device: Walker-rolling Max distance: 20 ft   Walk 10 feet activity   Assist  Walk 10 feet activity did not occur: Safety/medical concerns  Assist level: Minimal Assistance - Patient > 75% Assistive device: Walker-rolling   Walk 50 feet activity   Assist Walk 50 feet with 2 turns activity did not occur: Safety/medical concerns         Walk 150 feet activity   Assist Walk 150 feet activity did not occur: Safety/medical concerns         Walk 10 feet on uneven surface  activity   Assist Walk 10 feet on uneven surfaces activity did not occur: Safety/medical concerns         Wheelchair     Assist Will patient use wheelchair at discharge?: Yes Type of Wheelchair: Manual    Wheelchair assist level:  Dependent - Patient 0% Max wheelchair distance: 150 ft    Wheelchair 50 feet with 2 turns activity    Assist        Assist Level: Dependent - Patient 0%   Wheelchair 150 feet activity     Assist     Assist Level: Dependent - Patient 0%    Medical Problem List and Plan: 1.Functional deficits and left hemiparesissecondary to right pontine infarct on 05/01/2019  D/C today   2.  Antithrombotics: -DVT/anticoagulation:Pharmaceutical:Lovenox -antiplatelet therapy: ASA/Plavix X 3 weeks followed by ASA., may d/c home on ASA alone  3.Headaches/Chronic back pain/Pain Management:Voltaren gel for hands, increased to 4 times daily -Gabapentin  100 mg HS. -  tramadol prn with local measuresincluding heat -Lidocain patches/heat/ice.  DC oxycodone and scheduled Tylenol with codeine 3 times daily, was on chronic opioids at home - pt will follow up with PCP   Right wrist splint 4. Mood:LCSW to follow for evaluation and support. -antipsychotic agents: N/A 5. Neuropsych: This patientis notcapable of making decisions onherown behalf.   -some improvement  -probably underlying cognitive deficits at baseline  -neuropsych to see  -continue to support pt at bedside  6. Skin/Wound Care:LCSW tio follow for evaluation and support. 7. Fluids/Electrolytes/Nutrition:Monitor I/O.  D3 thins.  8. HTN: Monitor BP bid   Vitals:   05/25/19 1958 05/26/19 0335  BP: 133/61 (!) 123/53  Pulse: 86 81  Resp: 19 18  Temp: 98.2 F (36.8 C) 98.1 F (36.7 C)  SpO2: 99% 100%  controlled  9. COPD./chronic hypoxemic respiratory failure: Question maintainence medications.  Supplemental oxygen dependent. Added prn neb -OOB, IS 10. Pre renal azotemia resolved with hydration  Repeat BUN creatinine normal, GFR normal on 5/25 11. H/o depression: Continue cymbalta.  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  13. H/o B12 deficiency/Iron deficiency anemia: Hgb 8.4 at admission with mild leucocytosis.   Added iron supplement.  B12 supp  Critical hemoglobin of 5.9 on 5/25, improved after transfusion , EGD showing erosive esophagitis- f/u CBC as OP  No GI f/u needed  14. Dyslipidemia: Now on Lipitor.  15.  Episode of CP normal EKG, exam does show tenderness in the Left parasternal area, troponins neg,  CXR showing expected atelectasis Likely MSK-Resolved 16.  RIght wrist synovitis OA Voltaren gel  LOS: 24 days A FACE TO [place] E Kirsteins 05/26/2019, 7:26 AM

## 2019-05-26 NOTE — Patient Care Conference (Signed)
Inpatient RehabilitationTeam Conference and Plan of Care Update Date: 05/24/2019   Time: 11:00 AM    Patient Name: [name]      Medical Record Number: [id number]  Date of Birth: 1936-11-25 Sex: Female         Room/Bed: 4W10C/4W10C-01 Payor Info: Payor: [employer] MEDICARE / Plan: UHC MEDICARE / Product Type: *No Product type* /    Admitting Diagnosis: CVA 2 Team  Rt CVA; 22-24days  Admit Date/Time:  05/02/2019  5:41 PM Admission Comments: No comment available   Primary Diagnosis:  <principal problem not specified> Principal Problem: <principal problem not specified>  Patient Active Problem List   Diagnosis Date Noted  . Acute blood loss anemia   . Anemia of chronic disease   . Supplemental oxygen dependent   . Right pontine stroke ([place]) 05/02/2019  . Stroke ([place]) 05/01/2019  . Acute renal failure superimposed on stage 3 chronic kidney disease ([place]) 05/01/2019  . Acute ischemic stroke ([place]) 05/01/2019  . AKI (acute kidney injury) ([place]) 08/29/2018  . Encephalopathy acute 08/22/2018  . Chronic back pain 08/22/2018  . Vitamin B12 deficiency 08/19/2018  . Dehydration   . Tachycardia   . Altered mental state 08/18/2018  . Closed compression fracture of L1 vertebra (HCC)   . Acute cystitis without hematuria   . Pain management   . Intractable pain   . Back pain 10/10/2016  . L2 vertebral fracture ([place]) 10/10/2016  . Acute encephalopathy 12/25/2015  . Drug-induced delirium ([place]) 12/25/2015  . Acute on chronic respiratory failure ([place]) 12/25/2015  . CAP (community acquired pneumonia) 12/19/2015  . Chronic respiratory failure ([place]) 03/19/2014  . Depression 03/19/2014  . Aspiration pneumonia ([place]) 03/10/2014  . Acute respiratory failure with hypoxia ([place]) 03/10/2014  . Sepsis ([place]) 03/10/2014  . Hypokalemia 03/10/2014  . Hypertension 03/10/2014  . COPD (chronic obstructive pulmonary disease) ([place]) 03/10/2014  . Tobacco abuse 03/10/2014  . Normocytic anemia  06/15/2013  . Generalized abdominal pain 06/15/2013  . Blood in the stool 06/15/2013    Expected Discharge Date: Expected Discharge Date: 05/26/19  Team Members Present: Physician leading conference: Dr. Alysia Penna Social Worker Present: Alfonse Alpers, LCSW Nurse Present: Rayetta Pigg, RN PT Present: Michaelene Song, PT OT Present: Clyda Greener, OT SLP Present: Stormy Fabian, SLP PPS Coordinator present : Ileana Ladd, PT     Current Status/Progress Goal Weekly Team Focus  Medical   SYnovitis and pain are reduced , EGD today, anemia  reduce reliance on pain meds, stabilize hgb  cont rehab   Bowel/Bladder   Pt is continent of bowel and incontinent at times of bladder. LBM 05/21/2019.  Maintain regular bowel pattern.  Assist with toileting needs PRN.   Swallow/Nutrition/ Hydration   dysphagia 3 with thin liquids - Mod I   Mod I - goal met  carryover of swallow strategies   ADL's   Mod assist for UB bathing with max assist for LB bathing and dressing.  Max assist is also needed for UB dressing as well.  Mod assist for stand pivot transfers to the 3:1 with max assist for toilet hygiene and clothing management.  She uses the LUE at a diminshed level for bathing tasks with occasional min assist.    Mod to max assist level  selfcare retraining, balance retraining, transfer training, DME education, pt/family education neuromuscular re-education   Mobility   mod assist bed mobility, min-mod assist for stand pivot transfers with RW and gait up to 20 ft  min assist  for transfers and gait 20 ft  balance, strength, gait, transfers, neuro re-ed, and activity tolerance.    Communication             Safety/Cognition/ Behavioral Observations  Min A for basic problem solving  Min A - goals met  basic problem solving, attention   Pain   Pt complains of bilat hand pain. Pain managed with Voltaren gel. Pt complains of lower back pain of 8. Pain managed with Tylenol #3.  Pain < 4  Assess  pain Q shift and PRN.   Skin   Pt has stage II medical device related pressure ulcer to R ear.  Treat skin issues per orders.  Assess skin Q shift and PRN.     Rehab Goals Patient on target to meet rehab goals: Yes Rehab Goals Revised: none *See Care Plan and progress notes for long and short-term goals.     Barriers to Discharge  Current Status/Progress Possible Resolutions Date Resolved   Physician    Medical stability        see above .plan D/C in 1-2 days      Nursing                  PT                    OT                  SLP                SW                Discharge Planning/Teaching Needs:  Pt to return to her home where her son will assist her.  Pt feels he can provide mod A level of care.  Son came for family education.   Team Discussion:  Pt with anemia with blood in stool.  Pt to have GI scoping today and had blood transfusion already.  Pt will hopefully be ready to go on Friday, pending scope results.  Pt with aches and pains due to arthritis and osteoporosis and likely some underlying dementia.  Pt is continent and using bedpan.  She was anxious about scope, but better afterwards.  Pt is min A UB bathing and max A UB dressing.  Pt is max A for LB bathing and dressing.  Pt is mod A with the walker and max a for toileting.  Pt is getting good function back in her L arm.  P tis mod A to stand, but then min A for ambulation 10'-20'.  Pt is mod A to stand pivot tx and turn to sit.  Pt will need A with meds and money at home.  Revisions to Treatment Plan:  none    Continued Need for Acute Rehabilitation Level of Care: The patient requires daily medical management by a physician with specialized training in physical medicine and rehabilitation for the following conditions: Daily direction of a multidisciplinary physical rehabilitation program to ensure safe treatment while eliciting the highest outcome that is of practical value to the patient.: Yes Daily medical  management of patient stability for increased activity during participation in an intensive rehabilitation regime.: Yes Daily analysis of laboratory values and/or radiology reports with any subsequent need for medication adjustment of medical intervention for : Neurological problems;Other   I attest that I was present, lead the team conference, and concur with the assessment and plan of the team.Team conference was held via web/  teleconference due to [place] - 19.   Keerstin Bjelland, Silvestre Mesi 05/26/2019, 4:25 PM

## 2019-05-26 NOTE — Discharge Instructions (Signed)
Inpatient Rehab Discharge Instructions  [name] Discharge date and time: 05/26/19    Activities/Precautions/ Functional Status: Activity: no lifting, driving, or strenuous exercise  till cleared by MD Diet: cardiac diet NEEDS TO SIT UP FOR AN HOUR AFTER MEALS and after taking pills.  Wound Care: none needed   Functional status:  ___ No restrictions     ___ Walk up steps independently _X__ 24/7 supervision/assistance   ___ Walk up steps with assistance ___ Intermittent supervision/assistance  ___ Bathe/dress independently ___ Walk with walker     _X__ Bathe/dress with assistance ___ Walk Independently    ___ Shower independently ___ Walk with assistance    ___ Shower with assistance _X__ No alcohol     ___ Return to work/school ________  COMMUNITY REFERRALS UPON DISCHARGE:   Home Health:   PT     OT     SNA     Agency:  [facility]:  [phone] Medical Equipment/Items Ordered:  18"x18" hemi height wheelchair with basic cushion; tub transfer bench  Agency/Supplier:  [employer]       Phone:  [phone]  GENERAL COMMUNITY RESOURCES FOR PATIENT/FAMILY: Support Groups:  [facility] Stroke Support Group - on hold for COVID-19 currently                              Meets the second Thursday of each month from 6 - 7 PM (except June, July, August)                              [facility] [place]. [facility], [address], [place]                              For more information, call [phone]  Special Instructions: 1. Use thumb splint at nights and as needed during the day for pain management--remove for functional tasks.  2. Needs to crush medications and take with applesauce or pudding. Drink plenty of water with medications as they tend to stay in your esophagus.  3. Will need CBC rechecked in a week to monitor for stability.    STROKE/TIA DISCHARGE INSTRUCTIONS SMOKING Cigarette smoking nearly doubles your  risk of having a stroke & is the single most alterable risk factor  If you smoke or have smoked in the last 12 months, you are advised to quit smoking for your health.  Most of the excess cardiovascular risk related to smoking disappears within a year of stopping.  Ask you doctor about anti-smoking medications  [place] Quit Line: 1-800-QUIT NOW  Free Smoking Cessation Classes [phone]  CHOLESTEROL Know your levels; limit fat & cholesterol in your diet  Lipid Panel     Component Value Date/Time   CHOL 170 05/01/2019 0518   TRIG 96 05/01/2019 0518   HDL 45 05/01/2019 0518   CHOLHDL 3.8 05/01/2019 0518   VLDL 19 05/01/2019 0518   LDLCALC 106 (H) 05/01/2019 0518      Many patients benefit from treatment even if their cholesterol is at goal.  Goal: Total Cholesterol (CHOL) less than 160  Goal:  Triglycerides (TRIG) less than 150  Goal:  HDL greater than 40  Goal:  LDL (LDLCALC) less than 100   BLOOD PRESSURE American Stroke Association blood pressure target is less that 120/80  mm/Hg  Your discharge blood pressure is:  BP: 113/74  Monitor your blood pressure  Limit your salt and alcohol intake  Many individuals will require more than one medication for high blood pressure  DIABETES (A1c is a blood sugar average for last 3 months) Goal HGBA1c is under 7% (HBGA1c is blood sugar average for last 3 months)  Diabetes:     Lab Results  Component Value Date   HGBA1C 6.2 (H) 05/01/2019     Your HGBA1c can be lowered with medications, healthy diet, and exercise.  Check your blood sugar as directed by your physician  Call your physician if you experience unexplained or low blood sugars.  PHYSICAL ACTIVITY/REHABILITATION Goal is 30 minutes at least 4 days per week  Activity: Increase activity slowly, and No driving, Therapies: see above  Return to work: N/A  Activity decreases your risk of heart attack and stroke and makes your heart stronger.  It helps control your weight and  blood pressure; helps you relax and can improve your mood.  Participate in a regular exercise program.  Talk with your doctor about the best form of exercise for you (dancing, walking, swimming, cycling).  DIET/WEIGHT Goal is to maintain a healthy weight  Your discharge diet is:  Diet Order            DIET DYS 3 Room service appropriate? Yes; Fluid consistency: Thin  Diet effective now             liquids Your height is:  Height: 4\' 9"  (144.8 cm) Your current weight is: Weight: 56.2 kg Your Body Mass Index (BMI) is:  BMI (Calculated): 26.8  Following the type of diet specifically designed for you will help prevent another stroke.  Your goal weight is:   Your goal Body Mass Index (BMI) is 19-24.  Healthy food habits can help reduce 3 risk factors for stroke:  High cholesterol, hypertension, and excess weight.  RESOURCES Stroke/Support Group:  Call [phone]   STROKE EDUCATION PROVIDED/REVIEWED AND GIVEN TO PATIENT Stroke warning signs and symptoms How to activate emergency medical system (call 911). Medications prescribed at discharge. Need for follow-up after discharge. Personal risk factors for stroke. Pneumonia vaccine given:  Flu vaccine given:  My questions have been answered, the writing is legible, and I understand these instructions.  I will adhere to these goals & educational materials that have been provided to me after my discharge from the hospital.     My questions have been answered and I understand these instructions. I will adhere to these goals and the provided educational materials after my discharge from the hospital.  Patient/Caregiver Signature _______________________________ Date __________  Clinician Signature _______________________________________ Date __________  Please bring this form and your medication list with you to all your follow-up doctor's appointments.

## 2019-05-28 DIAGNOSIS — M19039 Primary osteoarthritis, unspecified wrist: Secondary | ICD-10-CM

## 2019-05-28 NOTE — Discharge Summary (Signed)
Physician Discharge Summary  Patient ID: [name] MRN: [id number] DOB/AGE: [birth year] 83 y.o.  Admit date: 05/02/2019 Discharge date: 05/26/2019  Discharge Diagnoses:  Principal Problem:   Right pontine stroke [facility]) Active Problems:   Hypertension   COPD (chronic obstructive pulmonary disease) (HCC)   Chronic back pain   Acute blood loss anemia   Anemia of chronic disease   Supplemental oxygen dependent   Wrist arthritis   Discharged Condition: stable.  Significant Diagnostic Studies:  Dg Wrist Complete Right  Result Date: 05/15/2019 CLINICAL DATA:  Pain EXAM: RIGHT WRIST - COMPLETE 3+ VIEW COMPARISON:  None. FINDINGS: There is no displaced fracture. No dislocation. There are advanced degenerative changes of the first carpometacarpal joint as well as the radiocarpal joint. Cystic changes are noted in the scaphoid, presumably degenerative in etiology. There is no radiopaque foreign body. There is a slight lucency through the waist of the scaphoid, likely artifactual. IMPRESSION: 1. No acute displaced fracture or dislocation. 2. Advanced degenerative changes of the first carpometacarpal joint. Electronically Signed   By: Constance Holster M.D.   On: 05/15/2019 15:26    Dg Chest Port 1 View  Result Date: 05/11/2019 CLINICAL DATA:  Chest pain EXAM: PORTABLE CHEST 1 VIEW COMPARISON:  08/29/2018 FINDINGS: With the cardiac silhouette is not significantly enlarged. Aortic calcifications are again noted. There is an airspace opacity at the left lung base. No pneumothorax. No large pleural effusion. No acute osseous abnormality. IMPRESSION: 1. No acute cardiopulmonary process. 2. Stable opacity at the left lung base favored to represent atelectasis. Electronically Signed   By: Constance Holster M.D.   On: 05/11/2019 21:36     Labs:  Basic Metabolic Panel: BMP Latest Ref Rng & Units 05/22/2019 05/15/2019 05/08/2019  Glucose 70 - 99 mg/dL 103(H) 126(H) 107(H)  BUN 8 - 23 mg/dL 22 29(H)  23  Creatinine 0.44 - 1.00 mg/dL 0.84 0.85 0.74  Sodium 135 - 145 mmol/L 140 135 139  Potassium 3.5 - 5.1 mmol/L 4.0 4.3 3.9  Chloride 98 - 111 mmol/L 98 95(L) 94(L)  CO2 22 - 32 mmol/L 31 33(H) 32  Calcium 8.9 - 10.3 mg/dL 8.9 8.5(L) 8.7(L)    CBC: Recent Labs  Lab 05/22/19 0613 05/22/19 0914 05/23/19 0515  WBC 9.0 8.2 10.0  HGB 6.2* 5.9* 10.3*  HCT 21.0* 20.0* 32.2*  MCV 87.9 88.1 86.1  PLT 577* 572* 522*    CBG: No results for input(s): GLUCAP in the last 168 hours.  Brief HPI:   [name] Is an 83 year old female with history of COPD-O2 dependent, HTN, chronic back pain who was admitted on 05/01/2019 with slurred speech, left-sided weakness and difficulty walking.  CTP/A was negative for LVO and showed proximal 50 to 70% left ICA stenosis.  MRI brain done showing acute right pontine infarct.  2D echo with bubble study was negative for PFO and showed EF of 60 to 65%.  Dr. Erlinda Hong felt stroke was due to small vessel disease and recommended DAPT x3 weeks followed by ASA alone as well as following up with [facility] for monitoring of asymptomatic left ICA stenosis.  Patient with resultant left-sided weakness, mild cognitive deficits and moderate flaccid dysarthria.  CIR was recommended to functional decline   Hospital Course: [name] was admitted to rehab 05/02/2019 for inpatient therapies to consist of PT, ST and OT at least three hours five days a week. Past admission physiatrist, therapy team and rehab RN have worked together to provide customized collaborative inpatient  rehab. Blood pressures were monitored on twice daily basis amlodipine was resumed on 528 as blood pressure started trending upwards.  She has completed 3-week course of Plavix therefore this was discontinued by 5/25.  B12 supplements was added due to history of vitamin B12 deficiency.  Serial check of BMET showed that  AKI has resolved.   She reported right hand and wrist pain and had minimal improvement with addition  of Voltaren gel.  X-rays of right wrist showed advanced degenerative changes of first carpometacarpal joint therefore splint was ordered for support and tylenol was scheduled 3 times daily to help with consistent pain care.  Oxycodone was discontinued due to confusion and lethargy and Tylenol #3 has been effective for pain management.  Currently patient is using Tylenol 3 once every 24 hours in addition to gabapentin 100 mg at bedtime and lidocaine patch to back for local measures   She did develop acute on chronic anemia with drop in H&H to 5.9/ 20.0.  She was transfused with 2 units packed red blood cells.  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 Dr.Buccini recommended continuing Protonix 40 mg daily as well as having patient sit up fully upright and drink plenty of water with pills.  Esophageal biopsies taken to rule out eosinophilic esophagitis and no GI follow-up recommended at this time.   Respiratory status has been stable on 3 L oxygen per nasal cannula.  She has continues to be limited by endurance issues and requires encouragement for consistent participation.  She currently requires min to mod assist for ADLs and mobility.  She will continue to receive further follow-up home health PT, OT and CNA by [facility] after discharge.   Rehab course: During patient's stay in rehab weekly team conferences were held to monitor patient's progress, set goals and discuss barriers to discharge. At admission, patient required max assist with mobility and total tasks.  She demonstrated generalized confusion, was oriented to place and situation only, was perseverative and hard to redirect and showed evidence of mild oral motor weakness with mild dysarthria. She  has had improvement in activity  tolerance, balance, postural control as well as ability to compensate for deficits. She has had improvement in functional use LUE  and LLE as well as improvement in awareness.  She requires mod assist to complete ADL tasks.  She required mod assist for transfers and is able to ambulate 11 feet with use of rolling walker and min assist for balance.  She requires min to mod assist with cognitive task and cognitive function was reported to be at baseline therefore no follow-up speech therapy needed.  Family education was completed with son regarding all aspects of safety, care and assistance needed.    Disposition: Home  Diet: Heart healthy  Special Instructions: 1.  Needs to sit upright for an hour after meals and after taking pills.  Needs to drink plenty of fluids with pills. 2.  Crush medications as able and take with applesauce or pudding. 3.  Recheck CBC in 1 week to monitor for stability.  Discharge Instructions    Ambulatory referral to Physical Medicine Rehab   Complete by:  As directed    1-2 weeks transitional care appt   Ambulatory referral to Psychology   Complete by:  As directed  Follow up with Dr. Sima Matas   Ambulatory referral to Vascular Surgery   Complete by:  As directed    L-ICA stenosis/hx of stroke     Allergies as of 05/26/2019      Reactions   Ambien [zolpidem] Other (See Comments)   Hallucinations and paranoid      Medication List    STOP taking these medications   clopidogrel 75 MG tablet Commonly known as:  PLAVIX   feeding supplement (ENSURE ENLIVE) Liqd   lisinopril 20 MG tablet Commonly known as:  ZESTRIL   lisinopril-hydrochlorothiazide 10-12.5 MG tablet Commonly known as:  ZESTORETIC   oxyCODONE 5 MG immediate release tablet Commonly known as:  Oxy IR/ROXICODONE   senna 8.6 MG Tabs tablet Commonly known as:  SENOKOT   SYRINGE 3CC/22GX3/4" 22G X 3/4" 3 ML Misc     TAKE these medications   acetaminophen 500 MG tablet Commonly  known as:  TYLENOL Take 1 tablet (500 mg total) by mouth 3 (three) times daily.   acetaminophen-codeine 300-30 MG tablet--Rx#30 pills Commonly known as:  TYLENOL #3 Take 1 tablet by mouth 2 (two) times daily as needed for severe pain.   albuterol 108 (90 Base) MCG/ACT inhaler Commonly known as:  VENTOLIN HFA Inhale 1-2 puffs into the lungs every 4 (four) hours as needed for wheezing or shortness of breath.   amLODipine 5 MG tablet Commonly known as:  NORVASC Take 1 tablet (5 mg total) by mouth daily.   aspirin 81 MG EC tablet Take 1 tablet (81 mg total) by mouth daily.   atorvastatin 20 MG tablet Commonly known as:  LIPITOR Take 1 tablet (20 mg total) by mouth daily at 6 PM.   cyanocobalamin 1000 MCG tablet Take 1 tablet (1,000 mcg total) by mouth daily.   diclofenac sodium 1 % Gel Commonly known as:  VOLTAREN Apply 2 g topically 4 (four) times daily.   docusate sodium 250 MG capsule Commonly known as:  COLACE Take 1 capsule (250 mg total) by mouth daily. Notes to patient:  Stool softner for constipation   DULoxetine 30 MG capsule Commonly known as:  CYMBALTA Take 2 capsules (60 mg total) by mouth daily. What changed:    how much to take  when to take this  additional instructions Notes to patient:  For pain and anxiety   gabapentin 100 MG capsule Commonly known as:  NEURONTIN Take 1 capsule (100 mg total) by mouth at bedtime. What changed:    how much to take  when to take this  reasons to take this  Another medication with the same name was removed. Continue taking this medication, and follow the directions you see here. Notes to patient:  For pain   lidocaine 5 % Commonly known as:  LIDODERM Place 1 patch onto the skin daily. Remove & Discard patch within 12 hours or as directed by MD Notes to patient:  Apply at 6 am and remove at 6 pm. Insurance usually does not pay for this--you can by Lidocaine patches over the counter in it's place.    Muscle  Rub 10-15 % Crea Apply 1 application topically 2 (two) times daily as needed for muscle pain.   senna-docusate 8.6-50 MG tablet Commonly known as:  Senokot-S Take 2 tablets by mouth 2 (two) times daily. Notes to patient:  For constipation.      ASK your doctor about these medications   pantoprazole 40 MG tablet Commonly known as:  PROTONIX TAKE 1 TABLET BY MOUTH  DAILY      Follow-up Information    Kirsteins, Luanna Salk, MD Follow up.   Specialty:  Physical Medicine and Rehabilitation Why:  Office will call you with follow up appointment Contact information: [place] [address] [phone]        [facility]. Call on 05/30/2019.   Why:  If you have not heard from them Contact information: [address]     [address] [place] [address] [phone]       Arta Silence, MD. Call.   Specialty:  Gastroenterology Why:  as needed for any further GI issues. Your medical doctor can refer her if needed.  Contact information: [address] [place] [address] [phone]        Edgardo Roys, PsyD Follow up.   Specialty:  Psychology Why:  Office will call you with follow up appointment Contact information: [place] [address] [phone]        [facility] Follow up.   Why:  Office will call you with follow up appointment for input on carotid stenosis Contact information: [place] [phone]          Signed: Bary Leriche 05/28/2019, 9:54 PM

## 2019-05-29 ENCOUNTER — Telehealth: Payer: Self-pay | Admitting: Registered Nurse

## 2019-05-29 NOTE — Telephone Encounter (Signed)
Transitional Care call  Patient name: [name] DOB: 07/08/1936 1. Are you/is patient experiencing any problems since coming home?[name] called office this morning feeling overwhelmed, all questions answered. Son is taking care of her. She did have a problem with her Oxygen tank over the weekend and call the fire department for assistance. Her oxygen company brought her a new tank she states.  a. Are there any questions regarding any aspect of care? No 2. Are there any questions regarding medications administration/dosing? No a. Are meds being taken as prescribed? Yes. Medication List Reviewed.  b. "Patient should review meds with caller to confirm"  3. Have there been any falls? No 4. Has Home Health been to the house and/or have they contacted you? No, [facility]. She and her son was instructed to call office if they don't receive a call from [facility] today, they verbalize understanding.  a. If not, have you tried to contact them? No b. Can we help you contact them? See Above 5. Are bowels and bladder emptying properly? Yes a. Are there any unexpected incontinence issues? No b. If applicable, is patient following bowel/bladder programs? NA 6. Any fevers, problems with breathing, unexpected pain? No 7. Are there any skin problems or new areas of breakdown? No 8. Has the patient/family member arranged specialty MD follow up (ie cardiology/neurology/renal/surgical/etc.)?  [name] and her son was instructed to call to schedule HFU appointments, they verbalize understanding.  a. Can we help arrange? NA 9. Does the patient need any other services or support that we can help arrange? No 10. Are caregivers following through as expected in assisting the patient? Yes 11. Has the patient quit smoking, drinking alcohol, or using drugs as recommended? (                        )  Appointment date/time 06/08/2019  arrival time 12:40 for 1:00 telehealth visit. With Bayard Hugger  ANP-C. At  [place]

## 2019-05-29 NOTE — Progress Notes (Signed)
Social Work Discharge Note  The overall goal for the admission was met for:   Discharge location: Yes - home  Length of Stay: Yes - 24 days  Discharge activity level: Yes - min/mod A  Home/community participation: Yes  Services provided included: MD, RD, PT, OT, SLP, RN, Pharmacy, Neuropsych and SW  Financial Services: Private Insurance: [employer]  Follow-up services arranged: Home Health: PT/OT/CNA from [facility], DME: 18"x18" hemi height; tub transfer bench from [employer] and Patient/Family has no preference for HH/DME agencies  Comments (or additional information):  Pt's son came for family education prior to pt discharging home.  He feels he can provide level of care pt needs at home.  Patient/Family verbalized understanding of follow-up arrangements: Yes  Individual responsible for coordination of the follow-up plan: pt and her son, [name] [phone]  Confirmed correct DME delivered: Trey Sailors 05/29/2019    Harneet Noblett, Silvestre Mesi

## 2019-05-30 NOTE — Progress Notes (Signed)
Patient ID: ERNESTENE COOVER, female   DOB: [birth year], 83 y.o.   MRN: [id number]    Diagnosis code:  I63.89; I69.354  Height:  4'9"  Weight:  56.2kg/ 123lbs   Patient has hemiparesis and hemiplegia following cerebral infarction which requires head to be positioned in ways not feasible with a normal bed.  Head must be elevated at least 30 degrees or patient cannot maintain proper swallowing and breathing.  Hemiparesis and hemiplegia requires frequent and immediate changes in body position which cannot be achieved with a normal bed.

## 2019-06-01 ENCOUNTER — Telehealth: Payer: Self-pay | Admitting: *Deleted

## 2019-06-01 NOTE — Telephone Encounter (Signed)
[name] called for POC 1wk1, 2wk6.  Approval given.

## 2019-06-08 ENCOUNTER — Other Ambulatory Visit: Payer: Self-pay

## 2019-06-08 ENCOUNTER — Encounter: Payer: Medicare Other | Attending: Registered Nurse | Admitting: Registered Nurse

## 2019-06-08 DIAGNOSIS — J449 Chronic obstructive pulmonary disease, unspecified: Secondary | ICD-10-CM

## 2019-06-08 DIAGNOSIS — I6350 Cerebral infarction due to unspecified occlusion or stenosis of unspecified cerebral artery: Secondary | ICD-10-CM

## 2019-06-08 DIAGNOSIS — I10 Essential (primary) hypertension: Secondary | ICD-10-CM

## 2019-06-08 DIAGNOSIS — Z9981 Dependence on supplemental oxygen: Secondary | ICD-10-CM

## 2019-06-08 NOTE — Progress Notes (Signed)
Subjective:    Patient ID: [name], female    DOB: August 01, 1936, 83 y.o.   MRN: [id number]  HPI: [name] is a 83 y.o. female her appointment was changed, due to national recommendations of social distancing due to [place] 19, an audio/video telehealth visit is felt to be most appropriate for this patient at this time.  See Chart message from today for the patient's consent to telehealth from [place].     Tele-health telephone call placed for follow up on transitional care visit in follow up of her right pontine stroke, essential hypertension, COPD and supplemental oxygen dependent. She presented to [facility] ED on 05/01/2019 with left-sided weakness, slurred speech and difficulty walking. Neurology was consulted.  CT Head: IMPRESSION: 1. Interval small age-indeterminate infarcts within the right caudate head and left cerebellar hemisphere. 2. Otherwise no large acute stroke, hemorrhage, or mass effect identified. ASPECTS is 10. 3. Stable chronic microvascular ischemic changes and parenchymal volume loss of the brain.  CT Angio Head W or WO Contrast: CTA neck:  1. Patent carotid and vertebral arteries. No dissection, occlusion, or aneurysm. 2. Left proximal ICA moderate 50-70% stenosis with mixed plaque. 3. Segments of mild stenosis of proximal bilateral vertebral arteries. 4. Aortic Atherosclerosis (ICD10-I70.0) and Emphysema (ICD10-J43.9).  CT Cerebral Perfusion W Contrast: 1. No acute stroke or significant oligemia by standard perfusion criteria. ASPECTS is 10.  CTA head:  1. No large vessel occlusion, aneurysm, or vascular malformation identified. 2. Extensive intracranial atherosclerosis with multiple areas of mild-to-moderate stenosis in the anterior and posterior Circulations.  [name] was admitted to Inpatient rehabilitation on 05/02/2019 and discharged home on 05/26/2019. She is receiving outpatient therapy from  [facility].   Wenda Overland RN asked the Health and History Questions. This provider and Wenda Overland verified we were speaking with the correct person using two identifiers.  Pain Inventory Average Pain 8 Pain Right Now 8 My pain is sharp  In the last 24 hours, has pain interfered with the following? General activity 5 Relation with others 5 Enjoyment of life 5 What TIME of day is your pain at its worst? daytime Sleep (in general) Poor  Pain is worse with: walking and standing Pain improves with: rest and medication Relief from Meds: 2  Mobility use a walker ability to climb steps?  no do you drive?  no needs help with transfers  Function retired I need assistance with the following:  dressing, bathing, meal prep, household duties and shopping  Neuro/Psych bladder control problems weakness numbness trouble walking dizziness depression  Prior Studies Any changes since last visit?  no  Physicians involved in your care Any changes since last visit?  no   Family History  Problem Relation Age of Onset  . Hypertension Sister   . Diabetes Mellitus II Sister    Social History   Socioeconomic History  . Marital status: Divorced    Spouse name: Not on file  . Number of children: Not on file  . Years of education: Not on file  . Highest education level: Not on file  Occupational History  . Not on file  Social Needs  . Financial resource strain: Not on file  . Food insecurity    Worry: Not on file    Inability: Not on file  . Transportation needs    Medical: Not on file    Non-medical: Not on file  Tobacco Use  . Smoking status: Former Smoker  Types: Cigarettes    Quit date: 06/15/2011    Years since quitting: 7.9  . Smokeless tobacco: Never Used  Substance and Sexual Activity  . Alcohol use: No  . Drug use: No  . Sexual activity: Never  Lifestyle  . Physical activity    Days per week: Not on file    Minutes per session: Not on file  .  Stress: Not on file  Relationships  . Social Herbalist on phone: Not on file    Gets together: Not on file    Attends religious service: Not on file    Active member of club or organization: Not on file    Attends meetings of clubs or organizations: Not on file    Relationship status: Not on file  Other Topics Concern  . Not on file  Social History Narrative  . Not on file   Past Surgical History:  Procedure Laterality Date  . ABDOMINAL HYSTERECTOMY    . BIOPSY  05/24/2019   Procedure: BIOPSY;  Surgeon: Ronald Lobo, MD;  Location: [place];  Service: Endoscopy;;  . ESOPHAGOGASTRODUODENOSCOPY N/A 06/15/2013   Procedure: ESOPHAGOGASTRODUODENOSCOPY (EGD);  Surgeon: [name], MD;  Location: [facility];  Service: Endoscopy;  Laterality: N/A;  . ESOPHAGOGASTRODUODENOSCOPY (EGD) WITH PROPOFOL N/A 05/24/2019   Procedure: ESOPHAGOGASTRODUODENOSCOPY (EGD) WITH PROPOFOL;  Surgeon: Ronald Lobo, MD;  Location: [place];  Service: Endoscopy;  Laterality: N/A;  . IR GENERIC HISTORICAL  10/15/2016   IR KYPHO LUMBAR INC FX REDUCE BONE BX UNI/BIL CANNULATION INC/IMAGING 10/15/2016 Luanne Bras, MD [facility]  . sacroplasty  06-14-13   05-10-13 IVR CONE for fracture stabilization   Past Medical History:  Diagnosis Date  . CAP (community acquired pneumonia)    multiple admissions  . Chronic back pain 08/22/2018  . Colitis 03/19/2014  . Compression fx, lumbar spine ([place])   . COPD (chronic obstructive pulmonary disease) ([place])   . Delirium   . Hypertension   . On home oxygen therapy 06-14-13   continuos 2.5l/m nasally-24/7  . Osteoporosis   . Sacral fracture ([place]) 2014   There were no vitals taken for this visit.  Opioid Risk Score:   Fall Risk Score:  `1  Depression screen PHQ 2/9  Depression screen PHQ 2/9 06/08/2019  Decreased Interest 1  Down, Depressed, Hopeless 1  PHQ - 2 Score 2    Review of Systems  Constitutional: Negative.   HENT:  Negative.   Eyes: Negative.   Respiratory: Negative.   Cardiovascular: Negative.   Gastrointestinal: Positive for constipation.  Endocrine: Negative.   Genitourinary: Positive for enuresis.       Gets up several time per night  Musculoskeletal: Positive for arthralgias, back pain and gait problem.  Skin: Negative.   Allergic/Immunologic: Negative.   Neurological: Positive for dizziness, weakness and numbness.  Hematological: Negative.   Psychiatric/Behavioral: Positive for dysphoric mood.  All other systems reviewed and are negative.      Objective:   Physical Exam Vitals signs and nursing note reviewed.  Musculoskeletal:     Comments: No Physical Exam Performed: Virtual Visit  Neurological:     Mental Status: She is oriented to person, place, and time.           Assessment & Plan:  1. Right [place] Stroke: Continue Home Health Therapy. Has a HFU appointment scheduled with Neurology. 2. Essential Hypertension: Continue current medication regimen. PCP Following.  3. COPD/ Supplemental Oxygen Dependent: Continue Oxygen PCP Following. Continue to  monitor.   F/U in 4- 6 weeks with Dr. Letta Pate.   Telephone Call Location of patient: In her Home Location of provider: Office Established patient Time spent on call:15 minutes

## 2019-06-15 ENCOUNTER — Encounter: Payer: Self-pay | Admitting: Registered Nurse

## 2019-06-16 ENCOUNTER — Encounter: Payer: Medicare Other | Admitting: Vascular Surgery

## 2019-06-17 ENCOUNTER — Inpatient Hospital Stay (HOSPITAL_COMMUNITY)
Admission: EM | Admit: 2019-06-17 | Discharge: 2019-06-20 | DRG: 812 | Disposition: A | Discharge status: Home Health | Payer: Medicare Other | Source: Ambulatory Visit | Attending: Internal Medicine | Admitting: Internal Medicine

## 2019-06-17 ENCOUNTER — Emergency Department (HOSPITAL_COMMUNITY): Payer: Medicare Other

## 2019-06-17 ENCOUNTER — Other Ambulatory Visit: Payer: Self-pay

## 2019-06-17 ENCOUNTER — Encounter (HOSPITAL_COMMUNITY): Payer: Self-pay | Admitting: Emergency Medicine

## 2019-06-17 DIAGNOSIS — I10 Essential (primary) hypertension: Secondary | ICD-10-CM | POA: Diagnosis present

## 2019-06-17 DIAGNOSIS — G8929 Other chronic pain: Secondary | ICD-10-CM | POA: Diagnosis present

## 2019-06-17 DIAGNOSIS — Z7982 Long term (current) use of aspirin: Secondary | ICD-10-CM

## 2019-06-17 DIAGNOSIS — K921 Melena: Secondary | ICD-10-CM

## 2019-06-17 DIAGNOSIS — Z8249 Family history of ischemic heart disease and other diseases of the circulatory system: Secondary | ICD-10-CM | POA: Diagnosis not present

## 2019-06-17 DIAGNOSIS — J449 Chronic obstructive pulmonary disease, unspecified: Secondary | ICD-10-CM | POA: Diagnosis present

## 2019-06-17 DIAGNOSIS — Z720 Tobacco use: Secondary | ICD-10-CM | POA: Diagnosis present

## 2019-06-17 DIAGNOSIS — Z1159 Encounter for screening for other viral diseases: Secondary | ICD-10-CM

## 2019-06-17 DIAGNOSIS — Z8701 Personal history of pneumonia (recurrent): Secondary | ICD-10-CM

## 2019-06-17 DIAGNOSIS — E876 Hypokalemia: Secondary | ICD-10-CM | POA: Diagnosis present

## 2019-06-17 DIAGNOSIS — N1330 Unspecified hydronephrosis: Secondary | ICD-10-CM

## 2019-06-17 DIAGNOSIS — I69354 Hemiplegia and hemiparesis following cerebral infarction affecting left non-dominant side: Secondary | ICD-10-CM

## 2019-06-17 DIAGNOSIS — Z9981 Dependence on supplemental oxygen: Secondary | ICD-10-CM

## 2019-06-17 DIAGNOSIS — M1712 Unilateral primary osteoarthritis, left knee: Secondary | ICD-10-CM | POA: Diagnosis present

## 2019-06-17 DIAGNOSIS — M810 Age-related osteoporosis without current pathological fracture: Secondary | ICD-10-CM | POA: Diagnosis present

## 2019-06-17 DIAGNOSIS — R609 Edema, unspecified: Secondary | ICD-10-CM | POA: Diagnosis not present

## 2019-06-17 DIAGNOSIS — J9610 Chronic respiratory failure, unspecified whether with hypoxia or hypercapnia: Secondary | ICD-10-CM | POA: Diagnosis present

## 2019-06-17 DIAGNOSIS — M25562 Pain in left knee: Secondary | ICD-10-CM | POA: Diagnosis present

## 2019-06-17 DIAGNOSIS — Z833 Family history of diabetes mellitus: Secondary | ICD-10-CM | POA: Diagnosis not present

## 2019-06-17 DIAGNOSIS — D62 Acute posthemorrhagic anemia: Secondary | ICD-10-CM | POA: Diagnosis present

## 2019-06-17 DIAGNOSIS — K922 Gastrointestinal hemorrhage, unspecified: Secondary | ICD-10-CM | POA: Diagnosis present

## 2019-06-17 HISTORY — DX: Unspecified osteoarthritis, unspecified site: M19.90

## 2019-06-17 LAB — CBC WITH DIFFERENTIAL/PLATELET
Abs Immature Granulocytes: 0.05 10*3/uL (ref 0.00–0.07)
Basophils Absolute: 0.1 10*3/uL (ref 0.0–0.1)
Basophils Relative: 1 %
Eosinophils Absolute: 0.5 10*3/uL (ref 0.0–0.5)
Eosinophils Relative: 6 %
HCT: 24.2 % — ABNORMAL LOW (ref 36.0–46.0)
Hemoglobin: 7.1 g/dL — ABNORMAL LOW (ref 12.0–15.0)
Immature Granulocytes: 1 %
Lymphocytes Relative: 19 %
Lymphs Abs: 1.7 10*3/uL (ref 0.7–4.0)
MCH: 25.6 pg — ABNORMAL LOW (ref 26.0–34.0)
MCHC: 29.3 g/dL — ABNORMAL LOW (ref 30.0–36.0)
MCV: 87.4 fL (ref 80.0–100.0)
Monocytes Absolute: 0.7 10*3/uL (ref 0.1–1.0)
Monocytes Relative: 8 %
Neutro Abs: 5.8 10*3/uL (ref 1.7–7.7)
Neutrophils Relative %: 65 %
Platelets: 629 10*3/uL — ABNORMAL HIGH (ref 150–400)
RBC: 2.77 MIL/uL — ABNORMAL LOW (ref 3.87–5.11)
RDW: 15.5 % (ref 11.5–15.5)
WBC: 8.8 10*3/uL (ref 4.0–10.5)
nRBC: 0 % (ref 0.0–0.2)

## 2019-06-17 LAB — BASIC METABOLIC PANEL
Anion gap: 10 (ref 5–15)
BUN: 19 mg/dL (ref 8–23)
CO2: 32 mmol/L (ref 22–32)
Calcium: 8.8 mg/dL — ABNORMAL LOW (ref 8.9–10.3)
Chloride: 100 mmol/L (ref 98–111)
Creatinine, Ser: 0.99 mg/dL (ref 0.44–1.00)
GFR calc Af Amer: >60 mL/min (ref >60)
GFR calc non Af Amer: 54 mL/min — ABNORMAL LOW (ref >60)
Glucose, Bld: 111 mg/dL — ABNORMAL HIGH (ref 70–99)
Potassium: 3.4 mmol/L — ABNORMAL LOW (ref 3.5–5.1)
Sodium: 142 mmol/L (ref 135–145)

## 2019-06-17 LAB — PROTIME-INR
INR: 1 (ref 0.8–1.2)
Prothrombin Time: 13 seconds (ref 11.4–15.2)

## 2019-06-17 LAB — POC OCCULT BLOOD, ED: Fecal Occult Bld: POSITIVE — AB

## 2019-06-17 MED ORDER — PANTOPRAZOLE SODIUM 40 MG IV SOLR
40.0000 mg | Freq: Once | INTRAVENOUS | Status: AC
  Administered 2019-06-17: 40 mg via INTRAVENOUS
  Filled 2019-06-17: qty 40

## 2019-06-17 MED ORDER — OXYCODONE HCL 5 MG PO TABS
5.0000 mg | ORAL_TABLET | Freq: Once | ORAL | Status: AC
  Administered 2019-06-17: 5 mg via ORAL
  Filled 2019-06-17: qty 1

## 2019-06-17 NOTE — ED Triage Notes (Signed)
Pt. BIB EMS c/o increasing leg pain of left lower extremity.  Pt. States HX stroke with fear of blood clot. LLE swollen, cap refill 3 seconds, pulse weak, and decreased movement. No redness. No warmth.

## 2019-06-17 NOTE — ED Notes (Signed)
Patient transported to X-ray 

## 2019-06-17 NOTE — ED Notes (Signed)
ED Provider at bedside. 

## 2019-06-17 NOTE — ED Triage Notes (Signed)
Pt. States taking Percocet for chronic back pain. Pt. Took medication prior to arrival to ED. Pt states having previous leg pain 6 on scale 0-10. Denies leg pain now.

## 2019-06-17 NOTE — ED Notes (Signed)
LLE pulse assessed with doppler. Pulse present.

## 2019-06-17 NOTE — ED Notes (Signed)
ED TO INPATIENT HANDOFF REPORT  ED Nurse Name and Phone #:  Dalphine Handing [phone]  S Name/Age/Gender [name] 83 y.o. female Room/Bed: 020C/020C  Code Status   Code Status: Prior  Home/SNF/Other Home Patient oriented to: self, place, time and situation Is this baseline? Yes   Triage Complete: Triage complete  Chief Complaint Leg pain   Triage Note Pt. BIB EMS c/o increasing leg pain of left lower extremity.  Pt. States HX stroke with fear of blood clot. LLE swollen, cap refill 3 seconds, pulse weak, and decreased movement. No redness. No warmth.  Pt. States taking Percocet for chronic back pain. Pt. Took medication prior to arrival to ED. Pt states having previous leg pain 6 on scale 0-10. Denies leg pain now.    Allergies Allergies  Allergen Reactions  . Ambien [Zolpidem] Other (See Comments)    Hallucinations and paranoid    Level of Care/Admitting Diagnosis ED Disposition    ED Disposition Condition [facility] Area: [place] [[id number]]  Level of Care: Telemetry Medical [104]  Covid Evaluation: N/A  Diagnosis: Acute blood loss anemia [350093]  Admitting Physician: Elwyn Reach [[id number]]  Attending Physician: Elwyn Reach [[id number]]  Estimated length of stay: past midnight tomorrow  Certification:: I certify this patient will need inpatient services for at least 2 midnights  PT Class (Do Not Modify): Inpatient [101]  PT Acc Code (Do Not Modify): Private [1]       B Medical/Surgery History Past Medical History:  Diagnosis Date  . Arthritis   . CAP (community acquired pneumonia)    multiple admissions  . Chronic back pain 08/22/2018  . Colitis 03/19/2014  . Compression fx, lumbar spine ([place])   . COPD (chronic obstructive pulmonary disease) ([place])   . Delirium   . Hypertension   . On home oxygen therapy 06-14-13   continuos 2.5l/m nasally-24/7  . Osteoporosis   . Sacral fracture ([place]) 2014   Past Surgical History:   Procedure Laterality Date  . ABDOMINAL HYSTERECTOMY    . BIOPSY  05/24/2019   Procedure: BIOPSY;  Surgeon: Ronald Lobo, MD;  Location: [place];  Service: Endoscopy;;  . ESOPHAGOGASTRODUODENOSCOPY N/A 06/15/2013   Procedure: ESOPHAGOGASTRODUODENOSCOPY (EGD);  Surgeon: [name], MD;  Location[facility];  Service: Endoscopy;  Laterality: N/A;  . ESOPHAGOGASTRODUODENOSCOPY (EGD) WITH PROPOFOL N/A 05/24/2019   Procedure: ESOPHAGOGASTRODUODENOSCOPY (EGD) WITH PROPOFOL;  Surgeon: Ronald Lobo, MD;  Location: [place];  Service: Endoscopy;  Laterality: N/A;  . IR GENERIC HISTORICAL  10/15/2016   IR KYPHO LUMBAR INC FX REDUCE BONE BX UNI/BIL CANNULATION INC/IMAGING 10/15/2016 Luanne Bras, MD [facility]  . sacroplasty  06-14-13   05-10-13 IVR CONE for fracture stabilization     A IV Location/Drains/Wounds Patient Lines/Drains/Airways Status   Active Line/Drains/Airways    Name:   Placement date:   Placement time:   Site:   Days:   Peripheral IV 06/17/19 Left Forearm   06/17/19    2203    Forearm   less than 1   Pressure Injury 05/08/19 Stage II -  Partial thickness loss of dermis presenting as a shallow open ulcer with a red, pink wound bed without slough. Device related pressure injury from chronic oxygen use and wearing glasses- patient states it has been pre   05/08/19    1848     40   Pressure Injury 05/25/19 Nose Left;Other (Comment) Stage II -  Partial thickness loss of dermis presenting  as a shallow open ulcer with a red, pink wound bed without slough. stage 2 pressure injury caused by glasses on left side of nose   05/25/19    1701     23          Intake/Output Last 24 hours No intake or output data in the 24 hours ending 06/17/19 2302  Labs/Imaging Results for orders placed or performed during the hospital encounter of 06/17/19 (from the past 48 hour(s))  CBC with Differential     Status: Abnormal   Collection Time: 06/17/19  8:17 PM  Result Value  Ref Range   WBC 8.8 4.0 - 10.5 K/uL   RBC 2.77 (L) 3.87 - 5.11 MIL/uL   Hemoglobin 7.1 (L) 12.0 - 15.0 g/dL   HCT 24.2 (L) 36.0 - 46.0 %   MCV 87.4 80.0 - 100.0 fL   MCH 25.6 (L) 26.0 - 34.0 pg   MCHC 29.3 (L) 30.0 - 36.0 g/dL   RDW 15.5 11.5 - 15.5 %   Platelets 629 (H) 150 - 400 K/uL   nRBC 0.0 0.0 - 0.2 %   Neutrophils Relative % 65 %   Neutro Abs 5.8 1.7 - 7.7 K/uL   Lymphocytes Relative 19 %   Lymphs Abs 1.7 0.7 - 4.0 K/uL   Monocytes Relative 8 %   Monocytes Absolute 0.7 0.1 - 1.0 K/uL   Eosinophils Relative 6 %   Eosinophils Absolute 0.5 0.0 - 0.5 K/uL   Basophils Relative 1 %   Basophils Absolute 0.1 0.0 - 0.1 K/uL   Immature Granulocytes 1 %   Abs Immature Granulocytes 0.05 0.00 - 0.07 K/uL    Comment: Performed at [facility], [address] [place], [place] [address]  Basic metabolic panel     Status: Abnormal   Collection Time: 06/17/19  8:17 PM  Result Value Ref Range   Sodium 142 135 - 145 mmol/L   Potassium 3.4 (L) 3.5 - 5.1 mmol/L   Chloride 100 98 - 111 mmol/L   CO2 32 22 - 32 mmol/L   Glucose, Bld 111 (H) 70 - 99 mg/dL   BUN 19 8 - 23 mg/dL   Creatinine, Ser 0.99 0.44 - 1.00 mg/dL   Calcium 8.8 (L) 8.9 - 10.3 mg/dL   GFR calc non Af Amer 54 (L) >60 mL/min   GFR calc Af Amer >60 >60 mL/min   Anion gap 10 5 - 15    Comment: Performed at [facility] [address], [place], [place] [address]  Protime-INR     Status: None   Collection Time: 06/17/19  8:17 PM  Result Value Ref Range   Prothrombin Time 13.0 11.4 - 15.2 seconds   INR 1.0 0.8 - 1.2    Comment: (NOTE) INR goal varies based on device and disease states. Performed at [facility], [place] [address], [place], [place] [address]   Type and screen [place]     Status: None   Collection Time: 06/17/19  9:48 PM  Result Value Ref Range   ABO/RH(D) O POS    Antibody Screen NEG    Sample Expiration      06/20/2019,2359 Performed at [facility],  [place] [address] [place],  [address]   POC occult blood, ED     Status: Abnormal   Collection Time: 06/17/19 10:04 PM  Result Value Ref Range   Fecal Occult Bld POSITIVE (A) NEGATIVE   Dg  Knee 2 Views Left  Result Date: 06/17/2019 CLINICAL DATA:  Left knee pain and swelling. EXAM: LEFT KNEE - 1-2 VIEW COMPARISON:  None. FINDINGS: No fracture, dislocation, or bony destructive change. There scattered heterogeneous primarily sclerotic densities in the distal femur and proximal tibia. Medial compartment joint space narrowing with peripheral spurring. Small patellofemoral spurs. Moderate joint effusion. Mild generalized soft tissue edema. IMPRESSION: 1. Mild to moderate osteoarthritis. 2. Moderate joint effusion. 3. Scattered sclerotic densities in the distal femur and proximal tibia are likely bone islands, however possibly of bone infarcts is raised. Electronically Signed   By: Keith Rake M.D.   On: 06/17/2019 20:43    Pending Labs Unresulted Labs (From admission, onward)    Start     Ordered   06/17/19 2157  Novel Coronavirus,NAA,(SEND-OUT TO REF LAB - TAT 24-48 hrs); Hosp Order  (Asymptomatic Patients Labs)  Once,   STAT    Question:  Rule Out  Answer:  Yes   06/17/19 2157          Vitals/Pain Today's Vitals   06/17/19 2115 06/17/19 2130 06/17/19 2145 06/17/19 2230  BP: (!) 144/57 (!) 133/54 (!) 138/48 135/68  Pulse: 95 91 78 (!) 112  Resp: (!) 24 19 18  (!) 24  Temp:      TempSrc:      SpO2: 100% 100% 100% 99%  Weight:      Height:      PainSc:        Isolation Precautions No active isolations  Medications Medications  oxyCODONE (Oxy IR/ROXICODONE) immediate release tablet 5 mg (5 mg Oral Given 06/17/19 2000)  pantoprazole (PROTONIX) injection 40 mg (40 mg Intravenous Given 06/17/19 2210)    Mobility walks with device Moderate fall risk   Focused Assessments LLE,    R Recommendations: See Admitting Provider Note  Report given to:   Additional Notes:

## 2019-06-17 NOTE — ED Provider Notes (Signed)
[facility]ERG[facility] DEPARTMENT Provider Note   CSN: [id number] Arrival date & time: 06/17/19  5916    History   Chief Complaint No chief complaint on file.   HPI [name] is a 83 y.o. female.     The history is provided by the patient, the EMS personnel and medical records.  Leg Pain Location:  Knee Time since incident:  2 days Injury: no   Knee location:  L knee Pain details:    Quality:  Aching, dull and cramping   Radiates to:  L leg   Severity:  Moderate   Onset quality:  Gradual   Duration:  2 days   Timing:  Constant   Progression:  Worsening Chronicity:  New Dislocation: no   Foreign body present:  No foreign bodies Tetanus status:  Unknown Prior injury to area:  No Relieved by:  Nothing Worsened by:  Activity, bearing weight, extension and flexion Ineffective treatments:  Elevation, immobilization and rest Associated symptoms: numbness, stiffness, swelling and tingling   Associated symptoms: no decreased ROM, no fever and no muscle weakness   Risk factors: no frequent fractures and no obesity     Past Medical History:  Diagnosis Date  . Arthritis   . CAP (community acquired pneumonia)    multiple admissions  . Chronic back pain 08/22/2018  . Colitis 03/19/2014  . Compression fx, lumbar spine ([place])   . COPD (chronic obstructive pulmonary disease) ([place])   . Delirium   . Hypertension   . On home oxygen therapy 06-14-13   continuos 2.5l/m nasally-24/7  . Osteoporosis   . Sacral fracture [facility]) 2014    Patient Active Problem List   Diagnosis Date Noted  . 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 Acute blood loss anemia   . Anemia of chronic disease   . Supplemental oxygen dependent   . Right pontine stroke ([place]) 05/02/2019  . Stroke ([place]) 05/01/2019  . Acute renal failure superimposed on stage 3 chronic kidney disease ([place]) 05/01/2019  . Acute ischemic stroke ([place]) 05/01/2019  . AKI (acute kidney injury) ([place]) 08/29/2018  .  Encephalopathy acute 08/22/2018  . Chronic back pain 08/22/2018  . Vitamin B12 deficiency 08/19/2018  . Dehydration   . Tachycardia   . Altered mental state 08/18/2018  . Closed compression fracture of L1 vertebra ([facility])   . Acute cystitis without hematuria   . Pain management   . Intractable pain   . Back pain 10/10/2016  . L2 vertebral fracture ([place]) 10/10/2016  . Acute encephalopathy 12/25/2015  . Drug-induced delirium ([place]) 12/25/2015  . Acute on chronic respiratory failure ([place]) 12/25/2015  . CAP (community acquired pneumonia) 12/19/2015  . Chronic respiratory failure ([place]) 03/19/2014  . Depression 03/19/2014  . Aspiration pneumonia ([place]) 03/10/2014  . Acute respiratory failure with hypoxia ([place]) 03/10/2014  . Sepsis ([place]) 03/10/2014  . Hypokalemia 03/10/2014  . Hypertension 03/10/2014  . COPD (chronic obstructive pulmonary disease) ([place]) 03/10/2014  . Tobacco abuse 03/10/2014  . Normocytic anemia 06/15/2013  . Generalized abdominal pain 06/15/2013  . Blood in the stool 06/15/2013    Past Surgical History:  Procedure Laterality Date  . ABDOMINAL HYSTERECTOMY    . BIOPSY  05/24/2019   Procedure: BIOPSY;  Surgeon: Ronald Lobo, MD;  Location: [facility];  Service: Endoscopy;;  . ESOPHAGOGASTRODUODENOSCOPY N/A 06/15/2013   Procedure: ESOPHAGOGASTRODUODENOSCOPY (EGD);  Surgeon: [name], MD;  Location: [facility];  Service: Endoscopy;  Laterality: N/A;  . ESOPHAGOGASTRODUODENOSCOPY (EGD) WITH PROPOFOL N/A 05/24/2019  Procedure: ESOPHAGOGASTRODUODENOSCOPY (EGD) WITH PROPOFOL;  Surgeon: Ronald Lobo, MD;  Location: [facility];  Service: Endoscopy;  Laterality: N/A;  . IR GENERIC HISTORICAL  10/15/2016   IR KYPHO LUMBAR INC FX REDUCE BONE BX UNI/BIL CANNULATION INC/IMAGING 10/15/2016 Luanne Bras, MD [facility]-INTER[facility] RAD  . sacroplasty  06-14-13   05-10-13 IVR [facility] for fracture stabilization     OB History   No obstetric history on file.      Home  Medications    Prior to Admission medications   Medication Sig Start Date End Date Taking? Authorizing Provider  acetaminophen (TYLENOL) 500 MG tablet Take 1 tablet (500 mg total) by mouth 3 (three) times daily. 05/26/19   Love, Ivan Anchors, PA-C  acetaminophen-codeine (TYLENOL #3) 300-30 MG tablet Take 1 tablet by mouth 2 (two) times daily as needed for severe pain. 05/26/19   Love, Ivan Anchors, PA-C  amLODipine (NORVASC) 5 MG tablet Take 1 tablet (5 mg total) by mouth daily. 05/26/19   Love, Ivan Anchors, PA-C  aspirin EC 81 MG EC tablet Take 1 tablet (81 mg total) by mouth daily. 05/03/19   Black, Lezlie Octave, NP  atorvastatin (LIPITOR) 20 MG tablet Take 1 tablet (20 mg total) by mouth daily at 6 PM. 05/26/19   Love, Ivan Anchors, PA-C  cyanocobalamin 1000 MCG tablet Take 1 tablet (1,000 mcg total) by mouth daily. 05/26/19   Love, Ivan Anchors, PA-C  diclofenac sodium (VOLTAREN) 1 % GEL Apply 2 g topically 4 (four) times daily.    [provider]  docusate sodium (COLACE) 250 MG capsule Take 1 capsule (250 mg total) by mouth daily. 06/09/18   Duffy Bruce, MD  DULoxetine (CYMBALTA) 30 MG capsule Take 2 capsules (60 mg total) by mouth daily. 05/26/19   Love, Ivan Anchors, PA-C  gabapentin (NEURONTIN) 100 MG capsule Take 1 capsule (100 mg total) by mouth at bedtime. 05/26/19   Love, Ivan Anchors, PA-C  lidocaine (LIDODERM) 5 % Place 1 patch onto the skin daily. Remove & Discard patch within 12 hours or as directed by MD Patient not taking: Reported on 06/08/2019 08/23/18   Eugenie Filler, MD  Menthol-Methyl Salicylate (MUSCLE RUB) 10-15 % CREA Apply 1 application topically 2 (two) times daily as needed for muscle pain. Patient not taking: Reported on 06/08/2019 05/26/19   Love, Ivan Anchors, PA-C  pantoprazole (PROTONIX) 40 MG tablet TAKE 1 TABLET BY MOUTH DAILY 05/26/19   Love, Ivan Anchors, PA-C  senna-docusate (SENOKOT-S) 8.6-50 MG tablet Take 2 tablets by mouth 2 (two) times daily. 05/26/19   Bary Leriche, PA-C    Family  History Family History  Problem Relation Age of Onset  . Hypertension Sister   . Diabetes Mellitus II Sister     Social History Social History   Tobacco Use  . Smoking status: Former Smoker    Types: Cigarettes    Quit date: 06/15/2011    Years since quitting: 8.0  . Smokeless tobacco: Never Used  Substance Use Topics  . Alcohol use: No  . Drug use: No     Allergies   Ambien [zolpidem]   Review of Systems Review of Systems  Constitutional: Negative for fever.  Musculoskeletal: Positive for stiffness.  All other systems reviewed and are negative.    Physical Exam Updated Vital Signs BP 135/68   Pulse (!) 112   Temp 97.9 F (36.6 C) (Oral)   Resp (!) 24   Ht 5' (1.524 m)   Wt 54.4 kg   SpO2  99%   BMI 23.44 kg/m   Physical Exam Vitals signs and nursing note reviewed.  Constitutional:      Appearance: She is well-developed.  HENT:     Head: Normocephalic and atraumatic.     Mouth/Throat:     Mouth: Mucous membranes are moist.     Comments: Uvula midline No obvious asymmetric palatine elevation No obvious swellings posterior oropharynx No obvious swellings appreciated on neck exam Patient able to move neck in all directions without difficulty, tolerating secretions appropriately  Eyes:     Conjunctiva/sclera: Conjunctivae normal.  Neck:     Musculoskeletal: Neck supple. No muscular tenderness.  Cardiovascular:     Rate and Rhythm: Normal rate.  Pulmonary:     Effort: Pulmonary effort is normal. No respiratory distress.     Breath sounds: No stridor. No wheezing or rhonchi.  Abdominal:     Palpations: Abdomen is soft.     Tenderness: There is no abdominal tenderness.  Musculoskeletal:        General: Swelling and tenderness present.     Right lower leg: No edema.     Left lower leg: Edema present.     Comments: 2+ pitting edema left lower extremity no edema right lower extremity  Palpable pulse 2+ pedal bilateral lower extremities  Sensation  intact bilateral lower extremities  Passive range of motion intact bilateral lower extremities, active range of motion left lower extremity limited secondary to pain  Tenderness to palpation left calf  No obvious erythema or skin changes bilateral lower extremities  No obvious wound, rash, ulcer visualized  Skin:    General: Skin is warm and dry.  Neurological:     General: No focal deficit present.     Mental Status: She is alert and oriented to person, place, and time.      ED Treatments / Results  Labs (all labs ordered are listed, but only abnormal results are displayed) Labs Reviewed  CBC WITH DIFFERENTIAL/PLATELET - Abnormal; Notable for the following components:      Result Value   RBC 2.77 (*)    Hemoglobin 7.1 (*)    HCT 24.2 (*)    MCH 25.6 (*)    MCHC 29.3 (*)    Platelets 629 (*)    All other components within normal limits  BASIC METABOLIC PANEL - Abnormal; Notable for the following components:   Potassium 3.4 (*)    Glucose, Bld 111 (*)    Calcium 8.8 (*)    GFR calc non Af Amer 54 (*)    All other components within normal limits  POC OCCULT BLOOD, ED - Abnormal; Notable for the following components:   Fecal Occult Bld POSITIVE (*)    All other components within normal limits  NOVEL CORONAVIRUS, NAA (HOSPITAL ORDER, SEND-OUT TO REF LAB)  PROTIME-INR  TYPE AND SCREEN    EKG EKG Interpretation  Date/Time:  Saturday June 17 2019 20:05:58 EDT Ventricular Rate:  101 PR Interval:    QRS Duration: 156 QT Interval:  375 QTC Calculation: 487 R Axis:   35 Text Interpretation:  Sinus tachycardia Nonspecific intraventricular conduction delay Artifact in lead(s) I II III aVR aVL aVF V1 V2 V3 V4 V6 Confirmed by Lennice Sites [phone]) on 06/17/2019 8:12:11 PM   Radiology Dg Knee 2 Views Left  Result Date: 06/17/2019 CLINICAL DATA:  Left knee pain and swelling. EXAM: LEFT KNEE - 1-2 VIEW COMPARISON:  None. FINDINGS: No fracture, dislocation, or bony  destructive change. There scattered heterogeneous  primarily sclerotic densities in the distal femur and proximal tibia. Medial compartment joint space narrowing with peripheral spurring. Small patellofemoral spurs. Moderate joint effusion. Mild generalized soft tissue edema. IMPRESSION: 1. Mild to moderate osteoarthritis. 2. Moderate joint effusion. 3. Scattered sclerotic densities in the distal femur and proximal tibia are likely bone islands, however possibly of bone infarcts is raised. Electronically Signed   By: Keith Rake M.D.   On: 06/17/2019 20:43    Procedures Procedures (including critical care time)  Medications Ordered in ED Medications  oxyCODONE (Oxy IR/ROXICODONE) immediate release tablet 5 mg (5 mg Oral Given 06/17/19 2000)  pantoprazole (PROTONIX) injection 40 mg (40 mg Intravenous Given 06/17/19 2210)     Initial Impression / Assessment and Plan / ED Course  I have reviewed the triage vital signs and the nursing notes.  Pertinent labs & imaging results that were available during my care of the patient were reviewed by me and considered in my medical decision making (see chart for details).        Medical Decision Making: KIMMERLY LORA is a 83 y.o. female who presented to the ED today with LLE swelling, pain.  Past medical history significant for COPD, hypertension, arthritis, chronic back pain, no history of DVT or PE Reviewed and confirmed nursing documentation for past medical history, family history, social history.  On my initial exam, the pt was appears uncomfortable, cooperative, conversant, follows commands appropriately, GCS 15, not tachycardic, not hypotensive, afebrile, no increased work of breathing or respiratory distress, no signs of impending respiratory failure  Left lower extremity swelling over the last 2 days, painful, asymmetric swelling, no obvious skin changes, no focal joint tenderness, no focal warmth or heat to any joint, passive range of  motion intact, compartments soft, 2+ pulses throughout, concern for venous thromboembolism, arterial clot, septic arthritis also considered, skin infection also considered, bony injury, dislocation also considered however no reported history of trauma Unable to obtain ultrasound at this time given no tach present.  Hemoglobin dropped so we will defer giving empiric anticoagulation.  Patient's hemoglobin came back at 7.0 down from 10.1 a month ago, had occult GI bleed secondary to pill induced esophagitis, was scoped by GI found to have occult bleed, hemoglobin dropped as low as 5.9 requiring transfusions. Patient typed and screened, point-of-care guaiac positive, no gross blood on rectal exam Patient consented for blood transfusion Touch base with GI, IV PPI administered All radiology and laboratory studies reviewed independently and with my attending physician, agree with reading provided by radiologist unless otherwise noted.  Upon reassessing patient, patient was calm, resting comfortably, no new complaints Based on the above findings, I believe patient requires admission. Patient admitted. The above care was discussed with and agreed upon by my attending physician. Emergency Department Medication Summary:  Medications  oxyCODONE (Oxy IR/ROXICODONE) immediate release tablet 5 mg (5 mg Oral Given 06/17/19 2000)  pantoprazole (PROTONIX) injection 40 mg (40 mg Intravenous Given 06/17/19 2210)   Final Clinical Impressions(s) / ED Diagnoses   Final diagnoses:  None    ED Discharge Orders    None       Lonzo Candy, MD 06/17/19 [place], [place], DO 06/17/19 2332

## 2019-06-17 NOTE — ED Notes (Signed)
Pt. Very tearful stating she does not want to be admitted to the hospital. Pt. Reassured. Pt. Informed of plan of care

## 2019-06-17 NOTE — ED Notes (Signed)
Pt complaining about been transferred to a small room on the ED and not to a private room upstair pt oriented that this will be only for the next 30 min while we give report and take her up pt stay " I am paying a lot of money to this hospital to be place on this poor room" pt oriented again that she will be transferred to her room as soon as possible and this is only temporary so another unstable pt can be assess by provider on the ED.

## 2019-06-17 NOTE — H&P (Signed)
History and Physical   [name] DPO:[id number] DOB: Apr 01, 1936 DOA: 06/17/2019  Referring MD/NP/PA: Dr. Ronnald Nian  PCP: [name], MD   Outpatient Specialists: None  Patient coming from: Home  Chief Complaint: Left leg pain  HPI: [name] is a 83 y.o. female with medical history significant of recent GI bleed, recurrent pneumonias, osteoarthritis, chronic back pain with compression fractures, COPD, hypertension, chronic respiratory failure on oxygen, osteoporosis who was sent over by her PCP due to pain in the left leg and tenderness suspected DVT.  Patient was seen and evaluated and found to have a hemoglobin of 7.1 in the ER.  She was also still guaiac positive.  With her recent GI bleed patient was further evaluated.  Hemoglobin dropped to 6.4.  Appears to have sinus tachycardia with no evidence of DVT.  Doppler ultrasound not available at this time of the night.  Patient is being admitted to the hospital with another GI bleed as well as left leg pain.  Denied any diarrhea.  No nausea or vomiting.  Patient denies any bright red blood per rectum.  Her pain is in the left knee is about 5 out of 10 at the moment.  It has persisted.  She has been unable to put weight on the leg..  ED Course: Temperature is 98.5 blood pressure 146/47 pulse 112 respiratory of 24 and oxygen sat 95% on 2 L.  Sodium 142 potassium is 3.4 chloride 100 CO2 32 2.  Glucose is 111.  She has a normal white count of 8.8 with hemoglobin 7.1 and platelets 629.  X-ray of the left knee shows mild to moderate osteoarthritis.  Moderate joint effusion and scattered sclerotic densities in the distal femur and proximal tibia probably bone islands.  Bone infarcts also not ruled out.  Patient is being admitted for treatment.  Stool guaiac is positive  Review of Systems: As per HPI otherwise 10 point review of systems negative.    Past Medical History:  Diagnosis Date  . Arthritis   . CAP (community acquired  pneumonia)    multiple admissions  . Chronic back pain 08/22/2018  . Colitis 03/19/2014  . Compression fx, lumbar spine ([place])   . COPD (chronic obstructive pulmonary disease) ([place])   . Delirium   . Hypertension   . On home oxygen therapy 06-14-13   continuos 2.5l/m nasally-24/7  . Osteoporosis   . Sacral fracture ([place]) 2014    Past Surgical History:  Procedure Laterality Date  . ABDOMINAL HYSTERECTOMY    . BIOPSY  05/24/2019   Procedure: BIOPSY;  Surgeon: Ronald Lobo, MD;  Location: [facility];  Service: Endoscopy;;  . ESOPHAGOGASTRODUODENOSCOPY N/A 06/15/2013   Procedure: ESOPHAGOGASTRODUODENOSCOPY (EGD);  Surgeon: [name], MD;  Location: [facility];  Service: Endoscopy;  Laterality: N/A;  . ESOPHAGOGASTRODUODENOSCOPY (EGD) WITH PROPOFOL N/A 05/24/2019   Procedure: ESOPHAGOGASTRODUODENOSCOPY (EGD) WITH PROPOFOL;  Surgeon: Ronald Lobo, MD;  Location: [facility];  Service: Endoscopy;  Laterality: N/A;  . IR GENERIC HISTORICAL  10/15/2016   IR KYPHO LUMBAR INC FX REDUCE BONE BX UNI/BIL CANNULATION INC/IMAGING 10/15/2016 Luanne Bras, MD [facility]  . sacroplasty  06-14-13   05-10-13 IVR [facility] for fracture stabilization     reports that she quit smoking about 8 years ago. Her smoking use included cigarettes. She has never used smokeless tobacco. She reports that she does not drink alcohol or use drugs.  Allergies  Allergen Reactions  . Ambien [Zolpidem] Other (See Comments)    Hallucinations  and paranoid    Family History  Problem Relation Age of Onset  . Hypertension Sister   . Diabetes Mellitus II Sister      Prior to Admission medications   Medication Sig Start Date End Date Taking? Authorizing Provider  acetaminophen (TYLENOL) 500 MG tablet Take 1 tablet (500 mg total) by mouth 3 (three) times daily. 05/26/19   Love, Ivan Anchors, PA-C  acetaminophen-codeine (TYLENOL #3) 300-30 MG tablet Take 1 tablet by mouth 2 (two) times daily as needed for  severe pain. 05/26/19   Love, Ivan Anchors, PA-C  amLODipine (NORVASC) 5 MG tablet Take 1 tablet (5 mg total) by mouth daily. 05/26/19   Love, Ivan Anchors, PA-C  aspirin EC 81 MG EC tablet Take 1 tablet (81 mg total) by mouth daily. 05/03/19   Black, Lezlie Octave, NP  atorvastatin (LIPITOR) 20 MG tablet Take 1 tablet (20 mg total) by mouth daily at 6 PM. 05/26/19   Love, Ivan Anchors, PA-C  cyanocobalamin 1000 MCG tablet Take 1 tablet (1,000 mcg total) by mouth daily. 05/26/19   Love, Ivan Anchors, PA-C  diclofenac sodium (VOLTAREN) 1 % GEL Apply 2 g topically 4 (four) times daily.    [provider]  docusate sodium (COLACE) 250 MG capsule Take 1 capsule (250 mg total) by mouth daily. 06/09/18   Duffy Bruce, MD  DULoxetine (CYMBALTA) 30 MG capsule Take 2 capsules (60 mg total) by mouth daily. 05/26/19   Love, Ivan Anchors, PA-C  gabapentin (NEURONTIN) 100 MG capsule Take 1 capsule (100 mg total) by mouth at bedtime. 05/26/19   Love, Ivan Anchors, PA-C  lidocaine (LIDODERM) 5 % Place 1 patch onto the skin daily. Remove & Discard patch within 12 hours or as directed by MD Patient not taking: Reported on 06/08/2019 08/23/18   Eugenie Filler, MD  Menthol-Methyl Salicylate (MUSCLE RUB) 10-15 % CREA Apply 1 application topically 2 (two) times daily as needed for muscle pain. Patient not taking: Reported on 06/08/2019 05/26/19   Love, Ivan Anchors, PA-C  pantoprazole (PROTONIX) 40 MG tablet TAKE 1 TABLET BY MOUTH DAILY 05/26/19   Love, Ivan Anchors, PA-C  senna-docusate (SENOKOT-S) 8.6-50 MG tablet Take 2 tablets by mouth 2 (two) times daily. 05/26/19   Bary Leriche, PA-C    Physical Exam: Vitals:   06/17/19 2145 06/17/19 2230 06/18/19 0035 06/18/19 0045  BP: (!) 138/48 135/68 (!) 146/47   Pulse: 78 (!) 112 98   Resp: 18 (!) 24 20   Temp:   98.4 F (36.9 C)   TempSrc:   Oral   SpO2: 100% 99% 100%   Weight:    60.1 kg  Height:    4\' 6"  (1.372 m)      Constitutional: Cachectic and chronically ill looking. Vitals:    06/17/19 2145 06/17/19 2230 06/18/19 0035 06/18/19 0045  BP: (!) 138/48 135/68 (!) 146/47   Pulse: 78 (!) 112 98   Resp: 18 (!) 24 20   Temp:   98.4 F (36.9 C)   TempSrc:   Oral   SpO2: 100% 99% 100%   Weight:    60.1 kg  Height:    4\' 6"  (1.372 m)   Eyes: PERRL, lids and conjunctivae normal ENMT: Mucous membranes are moist. Posterior pharynx clear of any exudate or lesions.Normal dentition.  Neck: normal, supple, no masses, no thyromegaly Respiratory: Decreased air entry with some wheezing and crackles, normal respiratory effort. No accessory muscle use.  Cardiovascular: Regular rate and rhythm, no murmurs /  rubs / gallops. No extremity edema. 2+ pedal pulses. No carotid bruits.  Abdomen: Mild epigastric tenderness, no masses palpated. No hepatosplenomegaly. Bowel sounds positive.  Musculoskeletal: no clubbing / cyanosis.  Left knee swollen and tender, decreased ROM, no contractures. Normal muscle tone.  Skin: no rashes, lesions, ulcers. No induration Neurologic: CN 2-12 grossly intact. Sensation intact, DTR normal. Strength 5/5 in all 4.  Psychiatric: Normal judgment and insight. Alert and oriented x 3. Normal mood.     Labs on Admission: I have personally reviewed following labs and imaging studies  CBC: Recent Labs  Lab 06/17/19 2017 06/18/19 0057  WBC 8.8 10.3  NEUTROABS 5.8 7.3  HGB 7.1* 6.4*  HCT 24.2* 21.5*  MCV 87.4 87.0  PLT 629* 841*   Basic Metabolic Panel: Recent Labs  Lab 06/17/19 2017  NA 142  K 3.4*  CL 100  CO2 32  GLUCOSE 111*  BUN 19  CREATININE 0.99  CALCIUM 8.8*   GFR: Estimated Creatinine Clearance: 30.8 mL/min (by C-G formula based on SCr of 0.99 mg/dL). Liver Function Tests: No results for input(s): AST, ALT, ALKPHOS, BILITOT, PROT, ALBUMIN in the last 168 hours. No results for input(s): LIPASE, AMYLASE in the last 168 hours. No results for input(s): AMMONIA in the last 168 hours. Coagulation Profile: Recent Labs  Lab 06/17/19 2017   INR 1.0   Cardiac Enzymes: No results for input(s): CKTOTAL, CKMB, CKMBINDEX, TROPONINI in the last 168 hours. BNP (last 3 results) No results for input(s): PROBNP in the last 8760 hours. HbA1C: No results for input(s): HGBA1C in the last 72 hours. CBG: No results for input(s): GLUCAP in the last 168 hours. Lipid Profile: No results for input(s): CHOL, HDL, LDLCALC, TRIG, CHOLHDL, LDLDIRECT in the last 72 hours. Thyroid Function Tests: No results for input(s): TSH, T4TOTAL, FREET4, T3FREE, THYROIDAB in the last 72 hours. Anemia Panel: No results for input(s): VITAMINB12, FOLATE, FERRITIN, TIBC, IRON, RETICCTPCT in the last 72 hours. Urine analysis:    Component Value Date/Time   COLORURINE YELLOW 08/18/2018 1820   APPEARANCEUR CLEAR 08/18/2018 1820   LABSPEC 1.019 08/18/2018 1820   PHURINE 5.0 08/18/2018 1820   GLUCOSEU NEGATIVE 08/18/2018 1820   HGBUR NEGATIVE 08/18/2018 1820   BILIRUBINUR NEGATIVE 08/18/2018 1820   KETONESUR 5 (A) 08/18/2018 1820   PROTEINUR 30 (A) 08/18/2018 1820   UROBILINOGEN 0.2 03/19/2014 1657   NITRITE NEGATIVE 08/18/2018 1820   LEUKOCYTESUR TRACE (A) 08/18/2018 1820   Sepsis Labs: @LABRCNTIP (procalcitonin:4,lacticidven:4) )No results found for this or any previous visit (from the past 240 hour(s)).   Radiological Exams on Admission: Dg Knee 2 Views Left  Result Date: 06/17/2019 CLINICAL DATA:  Left knee pain and swelling. EXAM: LEFT KNEE - 1-2 VIEW COMPARISON:  None. FINDINGS: No fracture, dislocation, or bony destructive change. There scattered heterogeneous primarily sclerotic densities in the distal femur and proximal tibia. Medial compartment joint space narrowing with peripheral spurring. Small patellofemoral spurs. Moderate joint effusion. Mild generalized soft tissue edema. IMPRESSION: 1. Mild to moderate osteoarthritis. 2. Moderate joint effusion. 3. Scattered sclerotic densities in the distal femur and proximal tibia are likely bone islands,  however possibly of bone infarcts is raised. Electronically Signed   By: Keith Rake M.D.   On: 06/17/2019 20:43    EKG: Independently reviewed.  It shows sinus tachycardia with a rate of 112.  No significant ST changes.  Assessment/Plan Principal Problem:   Acute blood loss anemia Active Problems:   Hypokalemia   Hypertension   COPD (chronic  obstructive pulmonary disease) (H[facility])   Tobacco abuse   Chronic respiratory failure ([facility])   GI bleed   Left knee pain     #1 acute blood loss anemia: Hemoglobin has dropped to 6.4.  We will transfuse 2 units of packed red blood cells.  GI consulted from the ER.  Patient may need further evaluation.  IV Protonix.  Serial CBCs.  #2 hypokalemia: Replete potassium.  #3 hypertension: Blood pressure is at baseline.  Continue home regimen.  #4 COPD: No exacerbation.  Patient appears to be at baseline.  #5 left knee pain and swelling: X-ray showed osteoarthritis.  Pain management with PT OT.  Doppler ultrasound ordered for tomorrow.      DVT prophylaxis: SCD Code Status: Full code Family Communication: No family at bedside Disposition Plan: Home Consults called: GI consulted from the ER Admission status: Inpatient  Severity of Illness: The appropriate patient status for this patient is INPATIENT. Inpatient status is judged to be reasonable and necessary in order to provide the required intensity of service to ensure the patient's safety. The patient's presenting symptoms, physical exam findings, and initial radiographic and laboratory data in the context of their chronic comorbidities is felt to place them at high risk for further clinical deterioration. Furthermore, it is not anticipated that the patient will be medically stable for discharge from the hospital within 2 midnights of admission. The following factors support the patient status of inpatient.   " The patient's presenting symptoms include left knee pain. " The worrisome  physical exam findings include left foot swelling. " The initial radiographic and laboratory data are worrisome because of hemoglobin 6.4. " The chronic co-morbidities include COPD with previous GI bleed.   * I certify that at the point of admission it is my clinical judgment that the patient will require inpatient hospital care spanning beyond 2 midnights from the point of admission due to high intensity of service, high risk for further deterioration and high frequency of surveillance required.Barbette Merino MD [facility] Pager [phone]  If 7PM-7AM, please contact night-coverage [email or web address] Password [facility]  06/18/2019, 2:53 AM

## 2019-06-18 ENCOUNTER — Inpatient Hospital Stay (HOSPITAL_COMMUNITY): Payer: Medicare Other

## 2019-06-18 DIAGNOSIS — D62 Acute posthemorrhagic anemia: Principal | ICD-10-CM

## 2019-06-18 DIAGNOSIS — R609 Edema, unspecified: Secondary | ICD-10-CM

## 2019-06-18 DIAGNOSIS — M25562 Pain in left knee: Secondary | ICD-10-CM | POA: Diagnosis present

## 2019-06-18 LAB — CBC WITH DIFFERENTIAL/PLATELET
Abs Immature Granulocytes: 0.05 10*3/uL (ref 0.00–0.07)
Abs Immature Granulocytes: 0.06 10*3/uL (ref 0.00–0.07)
Abs Immature Granulocytes: 0.07 10*3/uL (ref 0.00–0.07)
Abs Immature Granulocytes: 0.05 10*3/uL (ref 0.00–0.07)
Basophils Absolute: 0.1 10*3/uL (ref 0.0–0.1)
Basophils Absolute: 0.1 10*3/uL (ref 0.0–0.1)
Basophils Absolute: 0.1 10*3/uL (ref 0.0–0.1)
Basophils Absolute: 0 10*3/uL (ref 0.0–0.1)
Basophils Relative: 1 %
Basophils Relative: 1 %
Basophils Relative: 1 %
Basophils Relative: 1 %
Eosinophils Absolute: 0.5 10*3/uL (ref 0.0–0.5)
Eosinophils Absolute: 0.5 10*3/uL (ref 0.0–0.5)
Eosinophils Absolute: 0.5 10*3/uL (ref 0.0–0.5)
Eosinophils Absolute: 0.4 10*3/uL (ref 0.0–0.5)
Eosinophils Relative: 5 %
Eosinophils Relative: 5 %
Eosinophils Relative: 5 %
Eosinophils Relative: 5 %
HCT: 21.5 % — ABNORMAL LOW (ref 36.0–46.0)
HCT: 25.8 % — ABNORMAL LOW (ref 36.0–46.0)
HCT: 31 % — ABNORMAL LOW (ref 36.0–46.0)
HCT: 29.5 % — ABNORMAL LOW (ref 36.0–46.0)
Hemoglobin: 6.4 g/dL — CL (ref 12.0–15.0)
Hemoglobin: 7.7 g/dL — ABNORMAL LOW (ref 12.0–15.0)
Hemoglobin: 9.6 g/dL — ABNORMAL LOW (ref 12.0–15.0)
Hemoglobin: 9.2 g/dL — ABNORMAL LOW (ref 12.0–15.0)
Immature Granulocytes: 1 %
Immature Granulocytes: 1 %
Immature Granulocytes: 1 %
Immature Granulocytes: 1 %
Lymphocytes Relative: 16 %
Lymphocytes Relative: 16 %
Lymphocytes Relative: 17 %
Lymphocytes Relative: 18 %
Lymphs Abs: 1.6 10*3/uL (ref 0.7–4.0)
Lymphs Abs: 1.5 10*3/uL (ref 0.7–4.0)
Lymphs Abs: 1.5 10*3/uL (ref 0.7–4.0)
Lymphs Abs: 1.5 10*3/uL (ref 0.7–4.0)
MCH: 25.9 pg — ABNORMAL LOW (ref 26.0–34.0)
MCH: 26.2 pg (ref 26.0–34.0)
MCH: 26.8 pg (ref 26.0–34.0)
MCH: 27 pg (ref 26.0–34.0)
MCHC: 29.8 g/dL — ABNORMAL LOW (ref 30.0–36.0)
MCHC: 29.8 g/dL — ABNORMAL LOW (ref 30.0–36.0)
MCHC: 31 g/dL (ref 30.0–36.0)
MCHC: 31.2 g/dL (ref 30.0–36.0)
MCV: 87 fL (ref 80.0–100.0)
MCV: 87.8 fL (ref 80.0–100.0)
MCV: 86.6 fL (ref 80.0–100.0)
MCV: 86.5 fL (ref 80.0–100.0)
Monocytes Absolute: 0.7 10*3/uL (ref 0.1–1.0)
Monocytes Absolute: 0.7 10*3/uL (ref 0.1–1.0)
Monocytes Absolute: 0.6 10*3/uL (ref 0.1–1.0)
Monocytes Absolute: 0.7 10*3/uL (ref 0.1–1.0)
Monocytes Relative: 7 %
Monocytes Relative: 7 %
Monocytes Relative: 6 %
Monocytes Relative: 8 %
Neutro Abs: 7.3 10*3/uL (ref 1.7–7.7)
Neutro Abs: 6.7 10*3/uL (ref 1.7–7.7)
Neutro Abs: 6.5 10*3/uL (ref 1.7–7.7)
Neutro Abs: 5.9 10*3/uL (ref 1.7–7.7)
Neutrophils Relative %: 70 %
Neutrophils Relative %: 70 %
Neutrophils Relative %: 70 %
Neutrophils Relative %: 67 %
Platelets: 574 10*3/uL — ABNORMAL HIGH (ref 150–400)
Platelets: 533 10*3/uL — ABNORMAL HIGH (ref 150–400)
Platelets: 537 10*3/uL — ABNORMAL HIGH (ref 150–400)
Platelets: 513 10*3/uL — ABNORMAL HIGH (ref 150–400)
RBC: 2.47 MIL/uL — ABNORMAL LOW (ref 3.87–5.11)
RBC: 2.94 MIL/uL — ABNORMAL LOW (ref 3.87–5.11)
RBC: 3.58 MIL/uL — ABNORMAL LOW (ref 3.87–5.11)
RBC: 3.41 MIL/uL — ABNORMAL LOW (ref 3.87–5.11)
RDW: 15.5 % (ref 11.5–15.5)
RDW: 14.9 % (ref 11.5–15.5)
RDW: 14.9 % (ref 11.5–15.5)
RDW: 14.9 % (ref 11.5–15.5)
WBC: 10.3 10*3/uL (ref 4.0–10.5)
WBC: 9.5 10*3/uL (ref 4.0–10.5)
WBC: 9.1 10*3/uL (ref 4.0–10.5)
WBC: 8.6 10*3/uL (ref 4.0–10.5)
nRBC: 0 % (ref 0.0–0.2)
nRBC: 0 % (ref 0.0–0.2)
nRBC: 0 % (ref 0.0–0.2)
nRBC: 0 % (ref 0.0–0.2)

## 2019-06-18 LAB — COMPREHENSIVE METABOLIC PANEL
ALT: 10 U/L (ref 0–44)
AST: 13 U/L — ABNORMAL LOW (ref 15–41)
Albumin: 2.5 g/dL — ABNORMAL LOW (ref 3.5–5.0)
Alkaline Phosphatase: 74 U/L (ref 38–126)
Anion gap: 9 (ref 5–15)
BUN: 17 mg/dL (ref 8–23)
CO2: 32 mmol/L (ref 22–32)
Calcium: 8.5 mg/dL — ABNORMAL LOW (ref 8.9–10.3)
Chloride: 100 mmol/L (ref 98–111)
Creatinine, Ser: 0.82 mg/dL (ref 0.44–1.00)
GFR calc Af Amer: >60 mL/min (ref >60)
GFR calc non Af Amer: >60 mL/min (ref >60)
Glucose, Bld: 112 mg/dL — ABNORMAL HIGH (ref 70–99)
Potassium: 3.4 mmol/L — ABNORMAL LOW (ref 3.5–5.1)
Sodium: 141 mmol/L (ref 135–145)
Total Bilirubin: 0.9 mg/dL (ref 0.3–1.2)
Total Protein: 6 g/dL — ABNORMAL LOW (ref 6.5–8.1)

## 2019-06-18 LAB — PREPARE RBC (CROSSMATCH)

## 2019-06-18 MED ORDER — ONDANSETRON HCL 4 MG/2ML IJ SOLN: 4.0000 mg | Freq: Four times a day (QID) | INTRAMUSCULAR | Status: DC | PRN

## 2019-06-18 MED ORDER — MORPHINE SULFATE (PF) 2 MG/ML IV SOLN
2.0000 mg | INTRAVENOUS | Status: DC | PRN
  Administered 2019-06-18 – 2019-06-20 (×9): 2 mg via INTRAVENOUS
  Filled 2019-06-18 (×10): qty 1

## 2019-06-18 MED ORDER — ACETAMINOPHEN 325 MG PO TABS: 650.0000 mg | ORAL_TABLET | Freq: Four times a day (QID) | ORAL | Status: DC | PRN

## 2019-06-18 MED ORDER — DICLOFENAC SODIUM 1 % TD GEL
2.0000 g | Freq: Four times a day (QID) | TRANSDERMAL | Status: DC
  Administered 2019-06-18 – 2019-06-20 (×7): 2 g via TOPICAL
  Filled 2019-06-18: qty 100

## 2019-06-18 MED ORDER — SODIUM CHLORIDE 0.9% IV SOLUTION
Freq: Once | INTRAVENOUS | Status: AC
  Administered 2019-06-18: 03:00:00 via INTRAVENOUS

## 2019-06-18 MED ORDER — GABAPENTIN 100 MG PO CAPS
100.0000 mg | ORAL_CAPSULE | Freq: Every day | ORAL | Status: DC
  Administered 2019-06-18: 100 mg via ORAL
  Filled 2019-06-18 (×2): qty 1

## 2019-06-18 MED ORDER — ACETAMINOPHEN-CODEINE #3 300-30 MG PO TABS
1.0000 | ORAL_TABLET | Freq: Two times a day (BID) | ORAL | Status: DC | PRN
  Administered 2019-06-18: 1 via ORAL
  Filled 2019-06-18 (×2): qty 1

## 2019-06-18 MED ORDER — SODIUM CHLORIDE 0.9 % IV SOLN
INTRAVENOUS | Status: DC
  Administered 2019-06-18 – 2019-06-19 (×3): via INTRAVENOUS

## 2019-06-18 MED ORDER — SODIUM CHLORIDE 0.9 % IV SOLN
510.0000 mg | Freq: Once | INTRAVENOUS | Status: AC
  Administered 2019-06-18: 510 mg via INTRAVENOUS
  Filled 2019-06-18: qty 17

## 2019-06-18 MED ORDER — AMLODIPINE BESYLATE 2.5 MG PO TABS
5.0000 mg | ORAL_TABLET | Freq: Every day | ORAL | Status: DC
  Administered 2019-06-18 – 2019-06-20 (×3): 5 mg via ORAL
  Filled 2019-06-18 (×3): qty 2

## 2019-06-18 MED ORDER — TRAZODONE HCL 50 MG PO TABS
50.0000 mg | ORAL_TABLET | Freq: Once | ORAL | Status: AC
  Administered 2019-06-18: 50 mg via ORAL
  Filled 2019-06-18: qty 1

## 2019-06-18 MED ORDER — OXYCODONE HCL 5 MG PO TABS
5.0000 mg | ORAL_TABLET | Freq: Four times a day (QID) | ORAL | Status: DC | PRN
  Administered 2019-06-18: 5 mg via ORAL
  Filled 2019-06-18: qty 1

## 2019-06-18 MED ORDER — ONDANSETRON HCL 4 MG PO TABS: 4.0000 mg | ORAL_TABLET | Freq: Four times a day (QID) | ORAL | Status: DC | PRN

## 2019-06-18 MED ORDER — PANTOPRAZOLE SODIUM 40 MG IV SOLR
40.0000 mg | Freq: Two times a day (BID) | INTRAVENOUS | Status: DC
  Administered 2019-06-18 – 2019-06-20 (×4): 40 mg via INTRAVENOUS
  Filled 2019-06-18 (×5): qty 40

## 2019-06-18 MED ORDER — POLYETHYLENE GLYCOL 3350 17 G PO PACK
17.0000 g | PACK | Freq: Two times a day (BID) | ORAL | Status: DC
  Administered 2019-06-18 – 2019-06-20 (×2): 17 g via ORAL
  Filled 2019-06-18 (×4): qty 1

## 2019-06-18 NOTE — Consult Note (Signed)
Referring Provider:  Dr. Jacki Cones Primary Care Physician:  [name], MD Primary Gastroenterologist:  Dr. Paulita Fujita  Reason for Consultation: Recurrent anemia with heme positive stool  HPI: [name] is a 83 y.o. female with recent CVA and COPD on home O2, known to [place] from consultation and endoscopy last month for anemia and heme positivity.  In fact, the patient was seen by Dr. Paulita Fujita of our practice in 2014 because of heme positivity and anemia, at which time endoscopy showed gastric erosions despite ostensibly being on omeprazole at that time (aspirin was not a listed medication at that time).  Biopsies for Helicobacter pylori infection were negative.  Even back then, 6 years ago, the patient's primary physician advised against colonoscopy because of the patient's endoscopic findings and her COPD.  She was admitted through the emergency room yesterday with leg pain and weakness and found to have a 4 g drop in hemoglobin from the time of discharge on May 26, from 10.3 to 6.4, so the patient received 2 units of packed cells and has had a reasonable posttransfusion response to a hemoglobin of 7.7.    Apparently, there has been no issue with acute GI bleeding; in the emergency room, the patient was noted to have brown stool that was Hemoccult positive.  Endoscopy on her last admission showed focal distal erosive esophagitis thought to possibly be related to a pill induced ulceration.  She had esophageal rings but biopsies of the esophageal mucosa showed no evidence of eosinophilic esophagitis, and the patient denies dysphagia symptoms or heartburn.  Her lower extremity Doppler showed no evidence of deep venous thrombosis.  The patient is on 81 mg aspirin each day but also is on pantoprazole 40 mg daily.  The patient has never had colonoscopy.     Past Medical History:  Diagnosis Date  . Arthritis   . CAP (community acquired pneumonia)    multiple admissions  .  Chronic back pain 08/22/2018  . Colitis 03/19/2014  . Compression fx, lumbar spine ([place])   . COPD (chronic obstructive pulmonary disease) ([place])   . Delirium   . Hypertension   . On home oxygen therapy 06-14-13   continuos 2.5l/m nasally-24/7  . Osteoporosis   . Sacral fracture ([place]) 2014    Past Surgical History:  Procedure Laterality Date  . ABDOMINAL HYSTERECTOMY    . BIOPSY  05/24/2019   Procedure: BIOPSY;  Surgeon: Ronald Lobo, MD;  Location: [facility];  Service: Endoscopy;;  . ESOPHAGOGASTRODUODENOSCOPY N/A 06/15/2013   Procedure: ESOPHAGOGASTRODUODENOSCOPY (EGD);  Surgeon: [name], MD;  Location: [facility];  Service: Endoscopy;  Laterality: N/A;  . ESOPHAGOGASTRODUODENOSCOPY (EGD) WITH PROPOFOL N/A 05/24/2019   Procedure: ESOPHAGOGASTRODUODENOSCOPY (EGD) WITH PROPOFOL;  Surgeon: Ronald Lobo, MD;  Location: [facility];  Service: Endoscopy;  Laterality: N/A;  . IR GENERIC HISTORICAL  10/15/2016   IR KYPHO LUMBAR INC FX REDUCE BONE BX UNI/BIL CANNULATION INC/IMAGING 10/15/2016 Luanne Bras, MD [facility]INTER[facility]  . sacroplasty  06-14-13   05-10-13 IVR [facility] for fracture stabilization    Prior to Admission medications   Medication Sig Start Date End Date Taking? Authorizing Provider  acetaminophen (TYLENOL) 500 MG tablet Take 1 tablet (500 mg total) by mouth 3 (three) times daily. 05/26/19   Love, Ivan Anchors, PA-C  acetaminophen-codeine (TYLENOL #3) 300-30 MG tablet Take 1 tablet by mouth 2 (two) times daily as needed for severe pain. 05/26/19   Love, Ivan Anchors, PA-C  amLODipine (NORVASC) 5 MG tablet Take 1  tablet (5 mg total) by mouth daily. 05/26/19   Love, Ivan Anchors, PA-C  aspirin EC 81 MG EC tablet Take 1 tablet (81 mg total) by mouth daily. 05/03/19   Black, Lezlie Octave, NP  atorvastatin (LIPITOR) 20 MG tablet Take 1 tablet (20 mg total) by mouth daily at 6 PM. 05/26/19   Love, Ivan Anchors, PA-C  cyanocobalamin 1000 MCG tablet Take 1 tablet (1,000 mcg total) by mouth daily.  05/26/19   Love, Ivan Anchors, PA-C  diclofenac sodium (VOLTAREN) 1 % GEL Apply 2 g topically 4 (four) times daily.    [provider]  docusate sodium (COLACE) 250 MG capsule Take 1 capsule (250 mg total) by mouth daily. 06/09/18   Duffy Bruce, MD  DULoxetine (CYMBALTA) 30 MG capsule Take 2 capsules (60 mg total) by mouth daily. 05/26/19   Love, Ivan Anchors, PA-C  gabapentin (NEURONTIN) 100 MG capsule Take 1 capsule (100 mg total) by mouth at bedtime. 05/26/19   Love, Ivan Anchors, PA-C  lidocaine (LIDODERM) 5 % Place 1 patch onto the skin daily. Remove & Discard patch within 12 hours or as directed by MD Patient not taking: Reported on 06/08/2019 08/23/18   Eugenie Filler, MD  Menthol-Methyl Salicylate (MUSCLE RUB) 10-15 % CREA Apply 1 application topically 2 (two) times daily as needed for muscle pain. Patient not taking: Reported on 06/08/2019 05/26/19   Love, Ivan Anchors, PA-C  pantoprazole (PROTONIX) 40 MG tablet TAKE 1 TABLET BY MOUTH DAILY 05/26/19   Love, Ivan Anchors, PA-C  senna-docusate (SENOKOT-S) 8.6-50 MG tablet Take 2 tablets by mouth 2 (two) times daily. 05/26/19   Bary Leriche, PA-C    Current Facility-Administered Medications  Medication Dose Route Frequency Provider Last Rate Last Dose  . 0.9 %  sodium chloride infusion   Intravenous Continuous Georgette Shell, MD 75 mL/hr at 06/18/19 1247    . acetaminophen (TYLENOL) tablet 650 mg  650 mg Oral Q6H PRN Georgette Shell, MD      . acetaminophen-codeine (TYLENOL #3) 300-30 MG per tablet 1 tablet  1 tablet Oral BID PRN Georgette Shell, MD      . amLODipine (NORVASC) tablet 5 mg  5 mg Oral Daily Georgette Shell, MD   5 mg at 06/18/19 1250  . diclofenac sodium (VOLTAREN) 1 % transdermal gel 2 g  2 g Topical QID Georgette Shell, MD   2 g at 06/18/19 1249  . gabapentin (NEURONTIN) capsule 100 mg  100 mg Oral QHS Georgette Shell, MD      . morphine 2 MG/ML injection 2 mg  2 mg Intravenous Q3H PRN Elwyn Reach, MD   2 mg at 06/18/19 1515  . ondansetron (ZOFRAN) tablet 4 mg  4 mg Oral Q6H PRN Elwyn Reach, MD       Or  . ondansetron (ZOFRAN) injection 4 mg  4 mg Intravenous Q6H PRN Gala Romney L, MD      . pantoprazole (PROTONIX) injection 40 mg  40 mg Intravenous Q12H Elwyn Reach, MD   40 mg at 06/18/19 0851    Allergies as of 06/17/2019 - Review Complete 06/17/2019  Allergen Reaction Noted  . Ambien [zolpidem] Other (See Comments) 12/20/2015    Family History  Problem Relation Age of Onset  . Hypertension Sister   . Diabetes Mellitus II Sister     Social History   Socioeconomic History  . Marital status: Divorced    Spouse name: Not on  file  . Number of children: Not on file  . Years of education: Not on file  . Highest education level: Not on file  Occupational History  . Not on file  Social Needs  . Financial resource strain: Not on file  . Food insecurity    Worry: Not on file    Inability: Not on file  . Transportation needs    Medical: Not on file    Non-medical: Not on file  Tobacco Use  . Smoking status: Former Smoker    Types: Cigarettes    Quit date: 06/15/2011    Years since quitting: 8.0  . Smokeless tobacco: Never Used  Substance and Sexual Activity  . Alcohol use: No  . Drug use: No  . Sexual activity: Never  Lifestyle  . Physical activity    Days per week: Not on file    Minutes per session: Not on file  . Stress: Not on file  Relationships  . Social Herbalist on phone: Not on file    Gets together: Not on file    Attends religious service: Not on file    Active member of club or organization: Not on file    Attends meetings of clubs or organizations: Not on file    Relationship status: Not on file  . Intimate partner violence    Fear of current or ex partner: Not on file    Emotionally abused: Not on file    Physically abused: Not on file    Forced sexual activity: Not on file  Other Topics Concern  . Not on file   Social History Narrative  . Not on file    Review of Systems: No chest pain.  Tendency toward constipation, usually helped by use of stool softener at home.  Physical Exam: Vital signs in last 24 hours: Temp:  [97.6 F (36.4 C)-98.6 F (37 C)] 97.8 F (36.6 C) (06/21 1215) Pulse Rate:  [78-112] 93 (06/21 1215) Resp:  [17-24] 18 (06/21 1215) BP: (119-158)/(47-80) 158/73 (06/21 1215) SpO2:  [92 %-100 %] 98 % (06/21 1215) Weight:  [54.4 kg-60.1 kg] 60.1 kg (06/21 0045) Last BM Date: 06/17/19   This is a somewhat overweight, slightly chronically ill-appearing Caucasian female in no acute distress.  However, she is somewhat frustrated because she perceives that she has not had optimal care in the hospital, for example, she was told she was going to have a procedure today and has not had one, she does not want to wear a mask because she has been tested previously for the COVID virus and was negative, she is not getting her usual home pain medication, etc.  That said, she seems to be cognitively intact and is without any obvious focal cranial nerve deficits.  She is anicteric.  Her complexion is slightly pale consistent with her anemia.  The chest has soft expiratory wheezes diffusely.  However, she is not in any respiratory distress.  Heart normal.  Abdomen somewhat adipose, with some mild to moderate rather diffuse (predominantly upper) abdominal tenderness which appears more subjective than objective (no guarding, no rebound), no mass-effect.  Intake/Output from previous day: No intake/output data recorded. Intake/Output this shift: Total I/O In: 322 [Blood:322] Out: 500 [Urine:500]  Lab Results: Recent Labs    06/17/19 2017 06/18/19 0057 06/18/19 0759  WBC 8.8 10.3 9.5  HGB 7.1* 6.4* 7.7*  HCT 24.2* 21.5* 25.8*  PLT 629* 574* 533*   BMET Recent Labs    06/17/19  2017 06/18/19 0759  NA 142 141  K 3.4* 3.4*  CL 100 100  CO2 32 32  GLUCOSE 111* 112*  BUN 19 17  CREATININE  0.99 0.82  CALCIUM 8.8* 8.5*   LFT Recent Labs    06/18/19 0759  PROT 6.0*  ALBUMIN 2.5*  AST 13*  ALT 10  ALKPHOS 74  BILITOT 0.9   PT/INR Recent Labs    06/17/19 2017  LABPROT 13.0  INR 1.0    Studies/Results: Dg Knee 2 Views Left  Result Date: 06/17/2019 CLINICAL DATA:  Left knee pain and swelling. EXAM: LEFT KNEE - 1-2 VIEW COMPARISON:  None. FINDINGS: No fracture, dislocation, or bony destructive change. There scattered heterogeneous primarily sclerotic densities in the distal femur and proximal tibia. Medial compartment joint space narrowing with peripheral spurring. Small patellofemoral spurs. Moderate joint effusion. Mild generalized soft tissue edema. IMPRESSION: 1. Mild to moderate osteoarthritis. 2. Moderate joint effusion. 3. Scattered sclerotic densities in the distal femur and proximal tibia are likely bone islands, however possibly of bone infarcts is raised. Electronically Signed   By: Keith Rake M.D.   On: 06/17/2019 20:43   Vas [place] Lower Extremity Venous (dvt) (only [facility])  Result Date: 06/18/2019  Lower Venous Study Indications: Edema.  Comparison Study: Prior negative study done 05/04/19 is available for comparison Performing Technologist: Sharion Dove RVS  Examination Guidelines: A complete evaluation includes B-mode imaging, spectral Doppler, color Doppler, and power Doppler as needed of all accessible portions of each vessel. Bilateral testing is considered an integral part of a complete examination. Limited examinations for reoccurring indications may be performed as noted.  +-----+---------------+---------+-----------+----------+-------+ RIGHTCompressibilityPhasicitySpontaneityPropertiesSummary +-----+---------------+---------+-----------+----------+-------+ CFV  Full           Yes      Yes                          +-----+---------------+---------+-----------+----------+-------+    +---------+---------------+---------+-----------+----------+-------+ LEFT     CompressibilityPhasicitySpontaneityPropertiesSummary +---------+---------------+---------+-----------+----------+-------+ CFV      Full           Yes      Yes                          +---------+---------------+---------+-----------+----------+-------+ SFJ      Full                                                 +---------+---------------+---------+-----------+----------+-------+ FV Prox  Full                                                 +---------+---------------+---------+-----------+----------+-------+ FV Mid   Full                                                 +---------+---------------+---------+-----------+----------+-------+ FV DistalFull                                                 +---------+---------------+---------+-----------+----------+-------+  PFV      Full                                                 +---------+---------------+---------+-----------+----------+-------+ POP      Full           Yes      No                           +---------+---------------+---------+-----------+----------+-------+ PTV      Full                                                 +---------+---------------+---------+-----------+----------+-------+ PERO     Full                                                 +---------+---------------+---------+-----------+----------+-------+     Summary: Right: No evidence of common femoral vein obstruction. There is no evidence of deep vein thrombosis in the lower extremity. Left: No evidence of deep vein thrombosis in the lower extremity. No indirect evidence of obstruction proximal to the inguinal ligament.  *See table(s) above for measurements and observations. Electronically signed by Monica Martinez MD on 06/18/2019 at 3:12:25 PM.    Final     Impression: 1.  Recurrent anemia, severe and symptomatic, without evidence of  acute GI bleeding 2.  Persistent Hemoccult positivity on aspirin, despite PPI therapy 3.  Previous history (2014) of antral gastritis and erosions despite being on PPI therapy 4.  Tendency toward constipation 5.  Significant COPD, chronic back pain on chronic narcotic therapy, diminished mobility, recent CVA  Plan: 1.  Await results of COVID testing, which are required before the patient can have endoscopic procedures 2.  Consider endoscopy tomorrow (I will keep the patient NPO after midnight) to assess healing of esophagus on PPI therapy and to look for any cause of ongoing heme positivity and anemia, keeping in mind that the patient is at increased anesthesia risk in view of recent CVA and severe COPD 3.  Ideally, this patient would have colonoscopic evaluation, but it is uncertain whether this patient could undergo a colonoscopy, and the associated prep, without significant difficulty and risk.  Therefore, other options include checking for a source of blood loss in the upper tract again by updating her upper endoscopy, or simply managing the anemia by iron infusions and transfusions as needed. 4.  I will order MiraLAX while in-house, since she tends to run constipated, is at bed rest, and is on narcotics.   LOS: 1 day   Youlanda Mighty Wylene Weissman  06/18/2019, 3:27 PM   Pager [phone] If no answer or after 5 PM call [phone]

## 2019-06-18 NOTE — Progress Notes (Signed)
Hgb down to 6.4 from 7.1 in ED. MD paged. Orders received to transfuse 2 units of pRBCs. Will continue to monitor.

## 2019-06-18 NOTE — Progress Notes (Signed)
VASCULAR LAB PRELIMINARY  PRELIMINARY  PRELIMINARY  PRELIMINARY  Left lower extremity venous duplex completed.    Preliminary report:  See CV proc for preliminary results.   Kristin Barcus, RVT 06/18/2019, 2:14 PM

## 2019-06-18 NOTE — Progress Notes (Signed)
PROGRESS NOTE    [name]  OQH:[id number] DOB: 03/29/1936 DOA: 06/17/2019 PCP: [name], MD    Brief Narrative:82 y.o. female with medical history significant of recent GI bleed, recurrent pneumonias, osteoarthritis, chronic back pain with compression fractures, COPD, hypertension, chronic respiratory failure on oxygen, osteoporosis who was sent over by her PCP due to pain in the left leg and tenderness suspected DVT.  Patient was seen and evaluated and found to have a hemoglobin of 7.1 in the ER.  She was also still guaiac positive.  With her recent GI bleed patient was further evaluated.  Hemoglobin dropped to 6.4.  Appears to have sinus tachycardia with no evidence of DVT.  Doppler ultrasound not available at this time of the night.  Patient is being admitted to the hospital with another GI bleed as well as left leg pain.  Denied any diarrhea.  No nausea or vomiting.  Patient denies any bright red blood per rectum.  Her pain is in the left knee is about 5 out of 10 at the moment.  It has persisted.  She has been unable to put weight on the leg..  ED Course: Temperature is 98.5 blood pressure 146/47 pulse 112 respiratory of 24 and oxygen sat 95% on 2 L.  Sodium 142 potassium is 3.4 chloride 100 CO2 32 2.  Glucose is 111.  She has a normal white count of 8.8 with hemoglobin 7.1 and platelets 629.  X-ray of the left knee shows mild to moderate osteoarthritis.  Moderate joint effusion and scattered sclerotic densities in the distal femur and proximal tibia probably bone islands.  Bone infarcts also not ruled out.  Patient is being admitted for treatment.  Stool guaiac is positive  Assessment & Plan:   Principal Problem:   Acute blood loss anemia Active Problems:   Hypokalemia   Hypertension   COPD (chronic obstructive pulmonary disease) ([facility])   Tobacco abuse   Chronic respiratory failure ([facility])   GI bleed   Left knee pain   #1  GI bleed patient history of recent GI bleed  EGD in May 2020 shows antral gastritis biopsied still with ongoing dark stools with a hemoglobin down to 6.4 at the time of this admission.  She has received 2 units of packed RBCs overnight.  Her hemoglobin came up to 7.8.  GI was consulted from the ER.  Discussed with Dr. Cristina Gong who will see the patient today.  I will start her on clear liquid diet for today.  N.p.o. after midnight.  Continue IV Protonix.  #2 left knee pain and swelling x-ray done shows osteoarthritis, moderate joint effusion, scattered sclerotic densities in the distal femur and proximal tibia likely bone islands however possibility of bone infarct is raised.  No history of recent falls.start tylenol number 3  #3 O2 dependent COPD continue inhalers   #4 hypertension blood pressure is elevated restart Norvasc.  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  Pressure Injury 05/08/19 Stage II -  Partial thickness loss of dermis presenting as a shallow open ulcer with a red, pink wound bed without slough. Device related pressure injury from chronic oxygen use and wearing glasses- patient states it has been pre (Active)  05/08/19 1848  Location: Ear  Location Orientation: Left  Staging: Stage II -  Partial thickness  loss of dermis presenting as a shallow open ulcer with a red, pink wound bed without slough.  Wound Description (Comments): Device related pressure injury from chronic oxygen use and wearing glasses- patient states it has been present for a long time  Present on Admission: Yes     Pressure Injury 05/25/19 Nose Left;Other (Comment) Stage II -  Partial thickness loss of dermis presenting as a shallow open ulcer with a red, pink wound bed without slough. stage 2 pressure injury caused by glasses on left side of nose (Active)  05/25/19  1701  Location: Nose  Location Orientation: Left;Other (Comment)  Staging: Stage II -  Partial thickness loss of dermis presenting as a shallow open ulcer with a red, pink wound bed without slough.  Wound Description (Comments): stage 2 pressure injury caused by glasses on left side of nose  Present on Admission:     Estimated body mass index is 31.95 kg/m as calculated from the following:   Height as of this encounter: 4\' 6"  (1.372 m).   Weight as of this encounter: 60.1 kg.    Subjective: Patient complains of left knee pain asking something for pain waiting to have endoscopy done received 2 units of blood transfusion overnight  Very happy to see a female dr!! Objective: Vitals:   06/18/19 0753 06/18/19 0846 06/18/19 0936 06/18/19 1001  BP: 135/66 132/60 (!) 151/80 (!) 142/61  Pulse: 95 90 94   Resp: 17 20 18 20   Temp: 98.3 F (36.8 C)  98.6 F (37 C) 98.3 F (36.8 C)  TempSrc: Oral  Oral Oral  SpO2: 93% 100% 92% 100%  Weight:      Height:       No intake or output data in the 24 hours ending 06/18/19 1119 Filed Weights   06/17/19 1932 06/18/19 0045  Weight: 54.4 kg 60.1 kg    Examination:  General exam: Appears calm and comfortable  Respiratory system: Clear to auscultation. Respiratory effort normal. Cardiovascular system: S1 & S2 heard, RRR. No JVD, murmurs, rubs, gallops or clicks. No pedal edema. Gastrointestinal system: Abdomen is nondistended, soft and nontender. No organomegaly or masses felt. Normal bowel sounds heard. Central nervous system: Alert and oriented. No focal neurological deficits. Extremities: Left knee swollen decreased range of motion  skin: No rashes, lesions or ulcers Psychiatry: Judgement and insight appear normal. Mood & affect appropriate.     Data Reviewed: I have personally reviewed following labs and imaging studies  CBC: Recent Labs  Lab 06/17/19 2017 06/18/19 0057 06/18/19 0759  WBC 8.8 10.3 9.5  NEUTROABS 5.8 7.3 6.7   HGB 7.1* 6.4* 7.7*  HCT 24.2* 21.5* 25.8*  MCV 87.4 87.0 87.8  PLT 629* 574* 935*   Basic Metabolic Panel: Recent Labs  Lab 06/17/19 2017 06/18/19 0759  NA 142 141  K 3.4* 3.4*  CL 100 100  CO2 32 32  GLUCOSE 111* 112*  BUN 19 17  CREATININE 0.99 0.82  CALCIUM 8.8* 8.5*   GFR: Estimated Creatinine Clearance: 37.2 mL/min (by C-G formula based on SCr of 0.82 mg/dL). Liver Function Tests: Recent Labs  Lab 06/18/19 0759  AST 13*  ALT 10  ALKPHOS 74  BILITOT 0.9  PROT 6.0*  ALBUMIN 2.5*   No results for input(s): LIPASE, AMYLASE in the last 168 hours. No results for input(s): AMMONIA in the last 168 hours. Coagulation Profile: Recent Labs  Lab 06/17/19 2017  INR 1.0   Cardiac Enzymes: No results for input(s): CKTOTAL, CKMB, CKMBINDEX,  TROPONINI in the last 168 hours. BNP (last 3 results) No results for input(s): PROBNP in the last 8760 hours. HbA1C: No results for input(s): HGBA1C in the last 72 hours. CBG: No results for input(s): GLUCAP in the last 168 hours. Lipid Profile: No results for input(s): CHOL, HDL, LDLCALC, TRIG, CHOLHDL, LDLDIRECT in the last 72 hours. Thyroid Function Tests: No results for input(s): TSH, T4TOTAL, FREET4, T3FREE, THYROIDAB in the last 72 hours. Anemia Panel: No results for input(s): VITAMINB12, FOLATE, FERRITIN, TIBC, IRON, RETICCTPCT in the last 72 hours. Sepsis Labs: No results for input(s): PROCALCITON, LATICACIDVEN in the last 168 hours.  No results found for this or any previous visit (from the past 240 hour(s)).       Radiology Studies: Dg Knee 2 Views Left  Result Date: 06/17/2019 CLINICAL DATA:  Left knee pain and swelling. EXAM: LEFT KNEE - 1-2 VIEW COMPARISON:  None. FINDINGS: No fracture, dislocation, or bony destructive change. There scattered heterogeneous primarily sclerotic densities in the distal femur and proximal tibia. Medial compartment joint space narrowing with peripheral spurring. Small patellofemoral  spurs. Moderate joint effusion. Mild generalized soft tissue edema. IMPRESSION: 1. Mild to moderate osteoarthritis. 2. Moderate joint effusion. 3. Scattered sclerotic densities in the distal femur and proximal tibia are likely bone islands, however possibly of bone infarcts is raised. Electronically Signed   By: Keith Rake M.D.   On: 06/17/2019 20:43        Scheduled Meds: . pantoprazole (PROTONIX) IV  40 mg Intravenous Q12H   Continuous Infusions: . sodium chloride Stopped (06/18/19 0250)     LOS: 1 day     Georgette Shell, MD [facility]  If 7PM-7AM, please contact night-coverage [email or web address] Password [facility] 06/18/2019, 11:19 AM

## 2019-06-19 ENCOUNTER — Inpatient Hospital Stay (HOSPITAL_COMMUNITY): Payer: Medicare Other

## 2019-06-19 LAB — CBC WITH DIFFERENTIAL/PLATELET
Abs Immature Granulocytes: 0.04 10*3/uL (ref 0.00–0.07)
Abs Immature Granulocytes: 0.04 10*3/uL (ref 0.00–0.07)
Abs Immature Granulocytes: 0.05 10*3/uL (ref 0.00–0.07)
Abs Immature Granulocytes: 0.03 10*3/uL (ref 0.00–0.07)
Basophils Absolute: 0.1 10*3/uL (ref 0.0–0.1)
Basophils Absolute: 0 10*3/uL (ref 0.0–0.1)
Basophils Absolute: 0 10*3/uL (ref 0.0–0.1)
Basophils Absolute: 0 10*3/uL (ref 0.0–0.1)
Basophils Relative: 1 %
Basophils Relative: 0 %
Basophils Relative: 0 %
Basophils Relative: 1 %
Eosinophils Absolute: 0.5 10*3/uL (ref 0.0–0.5)
Eosinophils Absolute: 0.5 10*3/uL (ref 0.0–0.5)
Eosinophils Absolute: 0.4 10*3/uL (ref 0.0–0.5)
Eosinophils Absolute: 0.3 10*3/uL (ref 0.0–0.5)
Eosinophils Relative: 6 %
Eosinophils Relative: 5 %
Eosinophils Relative: 5 %
Eosinophils Relative: 4 %
HCT: 27.3 % — ABNORMAL LOW (ref 36.0–46.0)
HCT: 29.8 % — ABNORMAL LOW (ref 36.0–46.0)
HCT: 31 % — ABNORMAL LOW (ref 36.0–46.0)
HCT: 32.1 % — ABNORMAL LOW (ref 36.0–46.0)
Hemoglobin: 8.4 g/dL — ABNORMAL LOW (ref 12.0–15.0)
Hemoglobin: 9.2 g/dL — ABNORMAL LOW (ref 12.0–15.0)
Hemoglobin: 9.7 g/dL — ABNORMAL LOW (ref 12.0–15.0)
Hemoglobin: 9.8 g/dL — ABNORMAL LOW (ref 12.0–15.0)
Immature Granulocytes: 1 %
Immature Granulocytes: 1 %
Immature Granulocytes: 1 %
Immature Granulocytes: 0 %
Lymphocytes Relative: 20 %
Lymphocytes Relative: 17 %
Lymphocytes Relative: 16 %
Lymphocytes Relative: 13 %
Lymphs Abs: 1.7 10*3/uL (ref 0.7–4.0)
Lymphs Abs: 1.5 10*3/uL (ref 0.7–4.0)
Lymphs Abs: 1.5 10*3/uL (ref 0.7–4.0)
Lymphs Abs: 1.1 10*3/uL (ref 0.7–4.0)
MCH: 26.5 pg (ref 26.0–34.0)
MCH: 26.9 pg (ref 26.0–34.0)
MCH: 27 pg (ref 26.0–34.0)
MCH: 26.5 pg (ref 26.0–34.0)
MCHC: 30.8 g/dL (ref 30.0–36.0)
MCHC: 30.9 g/dL (ref 30.0–36.0)
MCHC: 31.3 g/dL (ref 30.0–36.0)
MCHC: 30.5 g/dL (ref 30.0–36.0)
MCV: 86.1 fL (ref 80.0–100.0)
MCV: 87.1 fL (ref 80.0–100.0)
MCV: 86.4 fL (ref 80.0–100.0)
MCV: 86.8 fL (ref 80.0–100.0)
Monocytes Absolute: 0.7 10*3/uL (ref 0.1–1.0)
Monocytes Absolute: 0.6 10*3/uL (ref 0.1–1.0)
Monocytes Absolute: 0.6 10*3/uL (ref 0.1–1.0)
Monocytes Absolute: 0.6 10*3/uL (ref 0.1–1.0)
Monocytes Relative: 8 %
Monocytes Relative: 7 %
Monocytes Relative: 7 %
Monocytes Relative: 7 %
Neutro Abs: 5.5 10*3/uL (ref 1.7–7.7)
Neutro Abs: 6.2 10*3/uL (ref 1.7–7.7)
Neutro Abs: 6.3 10*3/uL (ref 1.7–7.7)
Neutro Abs: 6.3 10*3/uL (ref 1.7–7.7)
Neutrophils Relative %: 64 %
Neutrophils Relative %: 70 %
Neutrophils Relative %: 71 %
Neutrophils Relative %: 75 %
Platelets: 477 10*3/uL — ABNORMAL HIGH (ref 150–400)
Platelets: 500 10*3/uL — ABNORMAL HIGH (ref 150–400)
Platelets: 503 10*3/uL — ABNORMAL HIGH (ref 150–400)
Platelets: 486 10*3/uL — ABNORMAL HIGH (ref 150–400)
RBC: 3.17 MIL/uL — ABNORMAL LOW (ref 3.87–5.11)
RBC: 3.42 MIL/uL — ABNORMAL LOW (ref 3.87–5.11)
RBC: 3.59 MIL/uL — ABNORMAL LOW (ref 3.87–5.11)
RBC: 3.7 MIL/uL — ABNORMAL LOW (ref 3.87–5.11)
RDW: 15.1 % (ref 11.5–15.5)
RDW: 15.4 % (ref 11.5–15.5)
RDW: 15.2 % (ref 11.5–15.5)
RDW: 15.4 % (ref 11.5–15.5)
WBC: 8.5 10*3/uL (ref 4.0–10.5)
WBC: 8.9 10*3/uL (ref 4.0–10.5)
WBC: 8.9 10*3/uL (ref 4.0–10.5)
WBC: 8.4 10*3/uL (ref 4.0–10.5)
nRBC: 0 % (ref 0.0–0.2)
nRBC: 0 % (ref 0.0–0.2)
nRBC: 0 % (ref 0.0–0.2)
nRBC: 0 % (ref 0.0–0.2)

## 2019-06-19 LAB — TYPE AND SCREEN
ABO/RH(D): O POS
Antibody Screen: NEGATIVE
Unit division: 0
Unit division: 0

## 2019-06-19 LAB — BPAM RBC
Blood Product Expiration Date: 202006282359
Blood Product Expiration Date: 202007192359
ISSUE DATE / TIME: 202006210307
ISSUE DATE / TIME: 202006210939
Unit Type and Rh: 5100
Unit Type and Rh: 9500

## 2019-06-19 MED ORDER — IOHEXOL 300 MG/ML  SOLN
100.0000 mL | Freq: Once | INTRAMUSCULAR | Status: AC | PRN
  Administered 2019-06-19: 100 mL via INTRAVENOUS

## 2019-06-19 NOTE — Progress Notes (Addendum)
PROGRESS NOTE    [name]  MEQ:[id number] DOB: 1936/11/24 DOA: 06/17/2019 PCP: [name], MD    Brief Narrative:83 y.o.femalewith medical history significant ofrecent GI bleed, recurrent pneumonias, osteoarthritis, chronic back pain with compression fractures, COPD, hypertension, chronic respiratory failure on oxygen, osteoporosis who was sent over by her PCP due to pain in the left leg and tenderness suspected DVT. Patient was seen and evaluated and found to have a hemoglobin of 7.1 in the ER. She was also still guaiac positive. With her recent GI bleed patient was further evaluated. Hemoglobin dropped to 6.4. Appears to have sinus tachycardia with no evidence of DVT. Doppler ultrasound not available at this time of the night. Patient is being admitted to the hospital with another GI bleed as well as left leg pain. Denied any diarrhea. No nausea or vomiting. Patient denies any bright red blood per rectum. Her pain is in the left knee is about 5 out of 10 at the moment. It has persisted. She has been unable to put weight on the leg..  ED Course:Temperature is 98.5 blood pressure 146/47 pulse 112 respiratory of 24 and oxygen sat 95% on 2 L. Sodium 142 potassium is 3.4 chloride 100 CO2 32 2. Glucose is 111. She has a normal white count of 8.8 with hemoglobin 7.1 and platelets 629. X-ray of the left knee shows mild to moderate osteoarthritis. Moderate joint effusion and scattered sclerotic densities in the distal femur and proximal tibia probably bone islands. Bone infarcts also not ruled out. Patient is being admitted for treatment.Stool guaiac is positive   Assessment & Plan:   Principal Problem:   Acute blood loss anemia Active Problems:   Hypokalemia   Hypertension   COPD (chronic obstructive pulmonary disease) (HCC)   Tobacco abuse   Chronic respiratory failure (HCC)   GI bleed   Left knee pain   #1  GI bleed patient history of recent GI bleed  EGD in May 2020 shows antral gastritis biopsied still with ongoing dark stools with a hemoglobin down to 6.4 at the time of this admission.  She has received 2 units of packed RBCs overnight.  Her hemoglobin  up to 9.7.  GI to scope her today.  Continue IV Protonix.  #2 left knee pain and swelling x-ray done shows osteoarthritis, moderate joint effusion, scattered sclerotic densities in the distal femur and proximal tibia likely bone islands however possibility of bone infarct is raised.  No history of recent falls.start tylenol number 3  #3 O2 dependent COPD continue inhalers   #4 hypertension blood pressure is elevated restart Norvasc.  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   DVT prophylaxis: SCD Code Status: Full code Family Communication: No family at bedside Disposition Plan:  Pending further GI work-up  consults called: GI consulted from the ER Pressure Injury 05/08/19 Stage II -  Partial thickness loss of dermis presenting as a shallow open ulcer with a red, pink wound bed without slough. Device related pressure injury from chronic oxygen use and wearing glasses- patient states it has been pre (Active)  05/08/19 1848  Location: Ear  Location Orientation: Left  Staging: Stage II -  Partial thickness loss of dermis presenting as a shallow open ulcer with a red, pink wound bed without slough.  Wound Description (Comments): Device related  pressure injury from chronic oxygen use and wearing glasses- patient states it has been present for a long time  Present on Admission: Yes     Pressure Injury 05/25/19 Nose Left;Other (Comment) Stage II -  Partial thickness loss of dermis presenting as a shallow open ulcer with a red, pink wound bed without slough. stage 2 pressure injury caused by  glasses on left side of nose (Active)  05/25/19 1701  Location: Nose  Location Orientation: Left;Other (Comment)  Staging: Stage II -  Partial thickness loss of dermis presenting as a shallow open ulcer with a red, pink wound bed without slough.  Wound Description (Comments): stage 2 pressure injury caused by glasses on left side of nose  Present on Admission:     Estimated body mass index is 32.42 kg/m as calculated from the following:   Height as of this encounter: 4\' 6"  (1.372 m).   Weight as of this encounter: 61 kg.    Subjective: Has had no bowel movement since admission however has not gotten out of bed has been n.p.o. for the procedure not had anything to eat.  Anxious to get the procedure done. Objective: Vitals:   06/19/19 0322 06/19/19 0330 06/19/19 0828 06/19/19 1128  BP:  (!) 145/61 (!) 152/60 (!) 142/80  Pulse:  90 96 99  Resp:  17 18 18   Temp:  97.8 F (36.6 C)  98 F (36.7 C)  TempSrc:  Oral  Oral  SpO2:  97%  94%  Weight: 61 kg     Height:        Intake/Output Summary (Last 24 hours) at 06/19/2019 1321 Last data filed at 06/19/2019 1209 Gross per 24 hour  Intake 1911.67 ml  Output 2500 ml  Net -588.33 ml   Filed Weights   06/17/19 1932 06/18/19 0045 06/19/19 0322  Weight: 54.4 kg 60.1 kg 61 kg    Examination:  General exam: Appears calm and comfortable  Respiratory system: Clear to auscultation. Respiratory effort normal. Cardiovascular system: S1 & S2 heard, RRR. No JVD, murmurs, rubs, gallops or clicks. No pedal edema. Gastrointestinal system: Abdomen is nondistended, soft and nontender. No organomegaly or masses felt. Normal bowel sounds heard. Central nervous system: Alert and oriented. No focal neurological deficits. Extremities: Symmetric 5 x 5 power. Skin: No rashes, lesions or ulcers Psychiatry: Judgement and insight appear normal. Mood & affect appropriate.     Data Reviewed: I have personally reviewed following labs and imaging  studies  CBC: Recent Labs  Lab 06/18/19 1643 06/18/19 1849 06/19/19 0101 06/19/19 0715 06/19/19 1218  WBC 9.1 8.6 8.5 8.9 8.9  NEUTROABS 6.5 5.9 5.5 6.2 6.3  HGB 9.6* 9.2* 8.4* 9.2* 9.7*  HCT 31.0* 29.5* 27.3* 29.8* 31.0*  MCV 86.6 86.5 86.1 87.1 86.4  PLT 537* 513* 477* 500* 166*   Basic Metabolic Panel: Recent Labs  Lab 06/17/19 2017 06/18/19 0759  NA 142 141  K 3.4* 3.4*  CL 100 100  CO2 32 32  GLUCOSE 111* 112*  BUN 19 17  CREATININE 0.99 0.82  CALCIUM 8.8* 8.5*   GFR: Estimated Creatinine Clearance: 37.5 mL/min (by C-G formula based on SCr of 0.82 mg/dL). Liver Function Tests: Recent Labs  Lab 06/18/19 0759  AST 13*  ALT 10  ALKPHOS 74  BILITOT 0.9  PROT 6.0*  ALBUMIN 2.5*   No results for input(s): LIPASE, AMYLASE in the last 168 hours. No results for input(s): AMMONIA in the last 168 hours. Coagulation Profile: Recent Labs  Lab  06/17/19 2017  INR 1.0   Cardiac Enzymes: No results for input(s): CKTOTAL, CKMB, CKMBINDEX, TROPONINI in the last 168 hours. BNP (last 3 results) No results for input(s): PROBNP in the last 8760 hours. HbA1C: No results for input(s): HGBA1C in the last 72 hours. CBG: No results for input(s): GLUCAP in the last 168 hours. Lipid Profile: No results for input(s): CHOL, HDL, LDLCALC, TRIG, CHOLHDL, LDLDIRECT in the last 72 hours. Thyroid Function Tests: No results for input(s): TSH, T4TOTAL, FREET4, T3FREE, THYROIDAB in the last 72 hours. Anemia Panel: No results for input(s): VITAMINB12, FOLATE, FERRITIN, TIBC, IRON, RETICCTPCT in the last 72 hours. Sepsis Labs: No results for input(s): PROCALCITON, LATICACIDVEN in the last 168 hours.  No results found for this or any previous visit (from the past 240 hour(s)).       Radiology Studies: Dg Knee 2 Views Left  Result Date: 06/17/2019 CLINICAL DATA:  Left knee pain and swelling. EXAM: LEFT KNEE - 1-2 VIEW COMPARISON:  None. FINDINGS: No fracture, dislocation, or  bony destructive change. There scattered heterogeneous primarily sclerotic densities in the distal femur and proximal tibia. Medial compartment joint space narrowing with peripheral spurring. Small patellofemoral spurs. Moderate joint effusion. Mild generalized soft tissue edema. IMPRESSION: 1. Mild to moderate osteoarthritis. 2. Moderate joint effusion. 3. Scattered sclerotic densities in the distal femur and proximal tibia are likely bone islands, however possibly of bone infarcts is raised. Electronically Signed   By: Keith Rake M.D.   On: 06/17/2019 20:43   Vas [place] Lower Extremity Venous (dvt) (only [facility])  Result Date: 06/18/2019  Lower Venous Study Indications: Edema.  Comparison Study: Prior negative study done 05/04/19 is available for comparison Performing Technologist: Sharion Dove RVS  Examination Guidelines: A complete evaluation includes B-mode imaging, spectral Doppler, color Doppler, and power Doppler as needed of all accessible portions of each vessel. Bilateral testing is considered an integral part of a complete examination. Limited examinations for reoccurring indications may be performed as noted.  +-----+---------------+---------+-----------+----------+-------+  RIGHT Compressibility Phasicity Spontaneity Properties Summary  +-----+---------------+---------+-----------+----------+-------+  CFV   Full            Yes       Yes                             +-----+---------------+---------+-----------+----------+-------+   +---------+---------------+---------+-----------+----------+-------+  LEFT      Compressibility Phasicity Spontaneity Properties Summary  +---------+---------------+---------+-----------+----------+-------+  CFV       Full            Yes       Yes                             +---------+---------------+---------+-----------+----------+-------+  SFJ       Full                                                       +---------+---------------+---------+-----------+----------+-------+  FV Prox   Full                                                      +---------+---------------+---------+-----------+----------+-------+  FV Mid    Full                                                      +---------+---------------+---------+-----------+----------+-------+  FV Distal Full                                                      +---------+---------------+---------+-----------+----------+-------+  PFV       Full                                                      +---------+---------------+---------+-----------+----------+-------+  POP       Full            Yes       No                              +---------+---------------+---------+-----------+----------+-------+  PTV       Full                                                      +---------+---------------+---------+-----------+----------+-------+  PERO      Full                                                      +---------+---------------+---------+-----------+----------+-------+     Summary: Right: No evidence of common femoral vein obstruction. There is no evidence of deep vein thrombosis in the lower extremity. Left: No evidence of deep vein thrombosis in the lower extremity. No indirect evidence of obstruction proximal to the inguinal ligament.  *See table(s) above for measurements and observations. Electronically signed by Monica Martinez MD on 06/18/2019 at 3:12:25 PM.    Final         Scheduled Meds:  amLODipine  5 mg Oral Daily   diclofenac sodium  2 g Topical QID   gabapentin  100 mg Oral QHS   pantoprazole (PROTONIX) IV  40 mg Intravenous Q12H   polyethylene glycol  17 g Oral BID   Continuous Infusions:  sodium chloride 75 mL/hr at 06/19/19 0250     LOS: 2 days     Georgette Shell, MD [facility]  If 7PM-7AM, please contact night-coverage [email or web address] Password [email or web address] 06/19/2019, 1:21 PM

## 2019-06-19 NOTE — Progress Notes (Signed)
Subjective: Patient was seen and examined at bedside. Complains of starving and wanting to eat. Patient denies black or bloody stools. She complains of pain all over and wants stronger medication for pain management.  Objective: Vital signs in last 24 hours: Temp:  [97.6 F (36.4 C)-98 F (36.7 C)] 98 F (36.7 C) (06/22 1128) Pulse Rate:  [86-99] 99 (06/22 1128) Resp:  [17-18] 18 (06/22 1128) BP: (139-152)/(57-80) 142/80 (06/22 1128) SpO2:  [94 %-97 %] 94 % (06/22 1128) Weight:  [61 kg] 61 kg (06/22 0322) Weight change: 6.568 kg Last BM Date: 06/17/19  PE: Elderly, frail, pale GENERAL: On oxygen via nasal cannula, will to speak in few words not full sentences without getting short of breath, soft expiratory wheezes diffusely ABDOMEN: Soft, nondistended, small protruding umbilical hernia EXTREMITIES: No deformity  Lab Results: Results for orders placed or performed during the hospital encounter of 06/17/19 (from the past 48 hour(s))  CBC with Differential     Status: Abnormal   Collection Time: 06/17/19  8:17 PM  Result Value Ref Range   WBC 8.8 4.0 - 10.5 K/uL   RBC 2.77 (L) 3.87 - 5.11 MIL/uL   Hemoglobin 7.1 (L) 12.0 - 15.0 g/dL   HCT 24.2 (L) 36.0 - 46.0 %   MCV 87.4 80.0 - 100.0 fL   MCH 25.6 (L) 26.0 - 34.0 pg   MCHC 29.3 (L) 30.0 - 36.0 g/dL   RDW 15.5 11.5 - 15.5 %   Platelets 629 (H) 150 - 400 K/uL   nRBC 0.0 0.0 - 0.2 %   Neutrophils Relative % 65 %   Neutro Abs 5.8 1.7 - 7.7 K/uL   Lymphocytes Relative 19 %   Lymphs Abs 1.7 0.7 - 4.0 K/uL   Monocytes Relative 8 %   Monocytes Absolute 0.7 0.1 - 1.0 K/uL   Eosinophils Relative 6 %   Eosinophils Absolute 0.5 0.0 - 0.5 K/uL   Basophils Relative 1 %   Basophils Absolute 0.1 0.0 - 0.1 K/uL   Immature Granulocytes 1 %   Abs Immature Granulocytes 0.05 0.00 - 0.07 K/uL    Comment: Performed at [facility], [address], [place], [place] [address]  Basic metabolic panel     Status: Abnormal   Collection  Time: 06/17/19  8:17 PM  Result Value Ref Range   Sodium 142 135 - 145 mmol/L   Potassium 3.4 (L) 3.5 - 5.1 mmol/L   Chloride 100 98 - 111 mmol/L   CO2 32 22 - 32 mmol/L   Glucose, Bld 111 (H) 70 - 99 mg/dL   BUN 19 8 - 23 mg/dL   Creatinine, Ser 0.99 0.44 - 1.00 mg/dL   Calcium 8.8 (L) 8.9 - 10.3 mg/dL   GFR calc non Af Amer 54 (L) >60 mL/min   GFR calc Af Amer >60 >60 mL/min   Anion gap 10 5 - 15    Comment: Performed at [facility] [address] [place], [place] [address]  Protime-INR     Status: None   Collection Time: 06/17/19  8:17 PM  Result Value Ref Range   Prothrombin Time 13.0 11.4 - 15.2 seconds   INR 1.0 0.8 - 1.2    Comment: (NOTE) INR goal varies based on device and disease states. Performed at [facility], [place] [address], [place], [place] [address]   Type and screen [place]     Status: None   Collection Time: 06/17/19 10:03  PM  Result Value Ref Range   ABO/RH(D) O POS    Antibody Screen NEG    Sample Expiration 06/20/2019,2359    Unit Number [id number]    Blood Component Type RBC LR PHER2    Unit division 00    Status of Unit ISSUED,FINAL    Transfusion Status OK TO TRANSFUSE    Crossmatch Result      Compatible Performed at [facility], [place] [address] [place], [place] [address]    Unit Number [id number]    Blood Component Type RBC LR PHER2    Unit division 00    Status of Unit ISSUED,FINAL    Transfusion Status OK TO TRANSFUSE    Crossmatch Result Compatible   POC occult blood, ED     Status: Abnormal   Collection Time: 06/17/19 10:04 PM  Result Value Ref Range   Fecal Occult Bld POSITIVE (A) NEGATIVE  CBC WITH DIFFERENTIAL     Status: Abnormal   Collection Time: 06/18/19 12:57 AM  Result Value Ref Range   WBC 10.3 4.0 - 10.5 K/uL   RBC 2.47 (L) 3.87 - 5.11 MIL/uL   Hemoglobin 6.4 (LL) 12.0 - 15.0 g/dL    Comment: REPEATED TO VERIFY THIS CRITICAL RESULT HAS VERIFIED AND BEEN CALLED TO  J.THOMAS,RN BY MELISSA BROGDON ON 06 21 2020 AT 0158, AND HAS BEEN READ BACK.     HCT 21.5 (L) 36.0 - 46.0 %   MCV 87.0 80.0 - 100.0 fL   MCH 25.9 (L) 26.0 - 34.0 pg   MCHC 29.8 (L) 30.0 - 36.0 g/dL   RDW 15.5 11.5 - 15.5 %   Platelets 574 (H) 150 - 400 K/uL   nRBC 0.0 0.0 - 0.2 %   Neutrophils Relative % 70 %   Neutro Abs 7.3 1.7 - 7.7 K/uL   Lymphocytes Relative 16 %   Lymphs Abs 1.6 0.7 - 4.0 K/uL   Monocytes Relative 7 %   Monocytes Absolute 0.7 0.1 - 1.0 K/uL   Eosinophils Relative 5 %   Eosinophils Absolute 0.5 0.0 - 0.5 K/uL   Basophils Relative 1 %   Basophils Absolute 0.1 0.0 - 0.1 K/uL   Immature Granulocytes 1 %   Abs Immature Granulocytes 0.05 0.00 - 0.07 K/uL    Comment: Performed at [facility] [address] [place], [place] [address]  Prepare RBC     Status: None   Collection Time: 06/18/19  2:28 AM  Result Value Ref Range   Order Confirmation      ORDER PROCESSED BY BLOOD BANK Performed at [facility], [place] [address] [place], [place] [address]   Comprehensive metabolic panel     Status: Abnormal   Collection Time: 06/18/19  7:59 AM  Result Value Ref Range   Sodium 141 135 - 145 mmol/L   Potassium 3.4 (L) 3.5 - 5.1 mmol/L   Chloride 100 98 - 111 mmol/L   CO2 32 22 - 32 mmol/L   Glucose, Bld 112 (H) 70 - 99 mg/dL   BUN 17 8 - 23 mg/dL   Creatinine, Ser 0.82 0.44 - 1.00 mg/dL   Calcium 8.5 (L) 8.9 - 10.3 mg/dL   Total Protein 6.0 (L) 6.5 - 8.1 g/dL   Albumin 2.5 (L) 3.5 - 5.0 g/dL   AST 13 (L) 15 - 41 U/L   ALT 10 0 - 44 U/L   Alkaline Phosphatase 74 38 - 126 U/L   Total Bilirubin 0.9  0.3 - 1.2 mg/dL   GFR calc non Af Amer >60 >60 mL/min   GFR calc Af Amer >60 >60 mL/min   Anion gap 9 5 - 15    Comment: Performed at [facility] [address] [place], [place] [address]  CBC WITH DIFFERENTIAL     Status: Abnormal   Collection Time: 06/18/19  7:59 AM  Result Value Ref Range   WBC 9.5 4.0 - 10.5 K/uL   RBC 2.94 (L) 3.87 - 5.11  MIL/uL   Hemoglobin 7.7 (L) 12.0 - 15.0 g/dL   HCT 25.8 (L) 36.0 - 46.0 %   MCV 87.8 80.0 - 100.0 fL   MCH 26.2 26.0 - 34.0 pg   MCHC 29.8 (L) 30.0 - 36.0 g/dL   RDW 14.9 11.5 - 15.5 %   Platelets 533 (H) 150 - 400 K/uL   nRBC 0.0 0.0 - 0.2 %   Neutrophils Relative % 70 %   Neutro Abs 6.7 1.7 - 7.7 K/uL   Lymphocytes Relative 16 %   Lymphs Abs 1.5 0.7 - 4.0 K/uL   Monocytes Relative 7 %   Monocytes Absolute 0.7 0.1 - 1.0 K/uL   Eosinophils Relative 5 %   Eosinophils Absolute 0.5 0.0 - 0.5 K/uL   Basophils Relative 1 %   Basophils Absolute 0.1 0.0 - 0.1 K/uL   Immature Granulocytes 1 %   Abs Immature Granulocytes 0.06 0.00 - 0.07 K/uL    Comment: Performed at [facility], [address], [place], [place] [address]  CBC WITH DIFFERENTIAL     Status: Abnormal   Collection Time: 06/18/19  4:43 PM  Result Value Ref Range   WBC 9.1 4.0 - 10.5 K/uL   RBC 3.58 (L) 3.87 - 5.11 MIL/uL   Hemoglobin 9.6 (L) 12.0 - 15.0 g/dL   HCT 31.0 (L) 36.0 - 46.0 %   MCV 86.6 80.0 - 100.0 fL   MCH 26.8 26.0 - 34.0 pg   MCHC 31.0 30.0 - 36.0 g/dL   RDW 14.9 11.5 - 15.5 %   Platelets 537 (H) 150 - 400 K/uL   nRBC 0.0 0.0 - 0.2 %   Neutrophils Relative % 70 %   Neutro Abs 6.5 1.7 - 7.7 K/uL   Lymphocytes Relative 17 %   Lymphs Abs 1.5 0.7 - 4.0 K/uL   Monocytes Relative 6 %   Monocytes Absolute 0.6 0.1 - 1.0 K/uL   Eosinophils Relative 5 %   Eosinophils Absolute 0.5 0.0 - 0.5 K/uL   Basophils Relative 1 %   Basophils Absolute 0.1 0.0 - 0.1 K/uL   Immature Granulocytes 1 %   Abs Immature Granulocytes 0.07 0.00 - 0.07 K/uL    Comment: Performed at [facility] [address] [place], [place] [address]  CBC WITH DIFFERENTIAL     Status: Abnormal   Collection Time: 06/18/19  6:49 PM  Result Value Ref Range   WBC 8.6 4.0 - 10.5 K/uL   RBC 3.41 (L) 3.87 - 5.11 MIL/uL   Hemoglobin 9.2 (L) 12.0 - 15.0 g/dL   HCT 29.5 (L) 36.0 - 46.0 %   MCV 86.5 80.0 - 100.0 fL   MCH 27.0 26.0 -  34.0 pg   MCHC 31.2 30.0 - 36.0 g/dL   RDW 14.9 11.5 - 15.5 %   Platelets 513 (H) 150 - 400 K/uL   nRBC 0.0 0.0 - 0.2 %   Neutrophils Relative % 67 %   Neutro  Abs 5.9 1.7 - 7.7 K/uL   Lymphocytes Relative 18 %   Lymphs Abs 1.5 0.7 - 4.0 K/uL   Monocytes Relative 8 %   Monocytes Absolute 0.7 0.1 - 1.0 K/uL   Eosinophils Relative 5 %   Eosinophils Absolute 0.4 0.0 - 0.5 K/uL   Basophils Relative 1 %   Basophils Absolute 0.0 0.0 - 0.1 K/uL   Immature Granulocytes 1 %   Abs Immature Granulocytes 0.05 0.00 - 0.07 K/uL    Comment: Performed at [facility] [address], [place], [place] [address]  CBC WITH DIFFERENTIAL     Status: Abnormal   Collection Time: 06/19/19  1:01 AM  Result Value Ref Range   WBC 8.5 4.0 - 10.5 K/uL   RBC 3.17 (L) 3.87 - 5.11 MIL/uL   Hemoglobin 8.4 (L) 12.0 - 15.0 g/dL   HCT 27.3 (L) 36.0 - 46.0 %   MCV 86.1 80.0 - 100.0 fL   MCH 26.5 26.0 - 34.0 pg   MCHC 30.8 30.0 - 36.0 g/dL   RDW 15.1 11.5 - 15.5 %   Platelets 477 (H) 150 - 400 K/uL   nRBC 0.0 0.0 - 0.2 %   Neutrophils Relative % 64 %   Neutro Abs 5.5 1.7 - 7.7 K/uL   Lymphocytes Relative 20 %   Lymphs Abs 1.7 0.7 - 4.0 K/uL   Monocytes Relative 8 %   Monocytes Absolute 0.7 0.1 - 1.0 K/uL   Eosinophils Relative 6 %   Eosinophils Absolute 0.5 0.0 - 0.5 K/uL   Basophils Relative 1 %   Basophils Absolute 0.1 0.0 - 0.1 K/uL   Immature Granulocytes 1 %   Abs Immature Granulocytes 0.04 0.00 - 0.07 K/uL    Comment: Performed at [facility] [address], [place], [place] [address]  CBC with Differential/Platelet     Status: Abnormal   Collection Time: 06/19/19  7:15 AM  Result Value Ref Range   WBC 8.9 4.0 - 10.5 K/uL   RBC 3.42 (L) 3.87 - 5.11 MIL/uL   Hemoglobin 9.2 (L) 12.0 - 15.0 g/dL   HCT 29.8 (L) 36.0 - 46.0 %   MCV 87.1 80.0 - 100.0 fL   MCH 26.9 26.0 - 34.0 pg   MCHC 30.9 30.0 - 36.0 g/dL   RDW 15.4 11.5 - 15.5 %   Platelets 500 (H) 150 - 400 K/uL   nRBC 0.0 0.0 -  0.2 %   Neutrophils Relative % 70 %   Neutro Abs 6.2 1.7 - 7.7 K/uL   Lymphocytes Relative 17 %   Lymphs Abs 1.5 0.7 - 4.0 K/uL   Monocytes Relative 7 %   Monocytes Absolute 0.6 0.1 - 1.0 K/uL   Eosinophils Relative 5 %   Eosinophils Absolute 0.5 0.0 - 0.5 K/uL   Basophils Relative 0 %   Basophils Absolute 0.0 0.0 - 0.1 K/uL   Immature Granulocytes 1 %   Abs Immature Granulocytes 0.04 0.00 - 0.07 K/uL    Comment: Performed at [facility] [address] [place], [place] [address]  CBC WITH DIFFERENTIAL     Status: Abnormal   Collection Time: 06/19/19 12:18 PM  Result Value Ref Range   WBC 8.9 4.0 - 10.5 K/uL   RBC 3.59 (L) 3.87 - 5.11 MIL/uL   Hemoglobin 9.7 (L) 12.0 - 15.0 g/dL   HCT 31.0 (L) 36.0 - 46.0 %   MCV 86.4 80.0 - 100.0 fL   MCH  27.0 26.0 - 34.0 pg   MCHC 31.3 30.0 - 36.0 g/dL   RDW 15.2 11.5 - 15.5 %   Platelets 503 (H) 150 - 400 K/uL   nRBC 0.0 0.0 - 0.2 %   Neutrophils Relative % 71 %   Neutro Abs 6.3 1.7 - 7.7 K/uL   Lymphocytes Relative 16 %   Lymphs Abs 1.5 0.7 - 4.0 K/uL   Monocytes Relative 7 %   Monocytes Absolute 0.6 0.1 - 1.0 K/uL   Eosinophils Relative 5 %   Eosinophils Absolute 0.4 0.0 - 0.5 K/uL   Basophils Relative 0 %   Basophils Absolute 0.0 0.0 - 0.1 K/uL   Immature Granulocytes 1 %   Abs Immature Granulocytes 0.05 0.00 - 0.07 K/uL    Comment: Performed at [facility] [address] [place], [place] [address]    Studies/Results: Dg Knee 2 Views Left  Result Date: 06/17/2019 CLINICAL DATA:  Left knee pain and swelling. EXAM: LEFT KNEE - 1-2 VIEW COMPARISON:  None. FINDINGS: No fracture, dislocation, or bony destructive change. There scattered heterogeneous primarily sclerotic densities in the distal femur and proximal tibia. Medial compartment joint space narrowing with peripheral spurring. Small patellofemoral spurs. Moderate joint effusion. Mild generalized soft tissue edema. IMPRESSION: 1. Mild to moderate osteoarthritis. 2.  Moderate joint effusion. 3. Scattered sclerotic densities in the distal femur and proximal tibia are likely bone islands, however possibly of bone infarcts is raised. Electronically Signed   By: Keith Rake M.D.   On: 06/17/2019 20:43   Vas [place] Lower Extremity Venous (dvt) (only [facility])  Result Date: 06/18/2019  Lower Venous Study Indications: Edema.  Comparison Study: Prior negative study done 05/04/19 is available for comparison Performing Technologist: Sharion Dove RVS  Examination Guidelines: A complete evaluation includes B-mode imaging, spectral Doppler, color Doppler, and power Doppler as needed of all accessible portions of each vessel. Bilateral testing is considered an integral part of a complete examination. Limited examinations for reoccurring indications may be performed as noted.  +-----+---------------+---------+-----------+----------+-------+ RIGHTCompressibilityPhasicitySpontaneityPropertiesSummary +-----+---------------+---------+-----------+----------+-------+ CFV  Full           Yes      Yes                          +-----+---------------+---------+-----------+----------+-------+   +---------+---------------+---------+-----------+----------+-------+ LEFT     CompressibilityPhasicitySpontaneityPropertiesSummary +---------+---------------+---------+-----------+----------+-------+ CFV      Full           Yes      Yes                          +---------+---------------+---------+-----------+----------+-------+ SFJ      Full                                                 +---------+---------------+---------+-----------+----------+-------+ FV Prox  Full                                                 +---------+---------------+---------+-----------+----------+-------+ FV Mid   Full                                                 +---------+---------------+---------+-----------+----------+-------+  FV DistalFull                                                  +---------+---------------+---------+-----------+----------+-------+ PFV      Full                                                 +---------+---------------+---------+-----------+----------+-------+ POP      Full           Yes      No                           +---------+---------------+---------+-----------+----------+-------+ PTV      Full                                                 +---------+---------------+---------+-----------+----------+-------+ PERO     Full                                                 +---------+---------------+---------+-----------+----------+-------+     Summary: Right: No evidence of common femoral vein obstruction. There is no evidence of deep vein thrombosis in the lower extremity. Left: No evidence of deep vein thrombosis in the lower extremity. No indirect evidence of obstruction proximal to the inguinal ligament.  *See table(s) above for measurements and observations. Electronically signed by Monica Martinez MD on 06/18/2019 at 3:12:25 PM.    Final     Medications: I have reviewed the patient's current medications.  Assessment: 83 year old female with recent CVA, COPD on home oxygen, admitted for recurrent severe and symptomatic anemia and FOBT positive stool, drop in hemoglobin from 10.3 (05/23/2019) to 6.4 on admission without obvious melena or hematochezia.  EGD from 04/2019 showed focal distal erosive esophagitis. No prior colonoscopy   Plan: As hemoglobin has improved from 6.4 to 9.2 after 2 units of PRBC transfusion on 06/18/2019, without obvious melena or hematochezia, given her age and multiple comorbidities, recent EGD on  05/24/2019, no plans for endoscopic intervention as the risks of anesthesia and procedures outweigh the benefits. I have discussed the same with the patient. She has a normal BUN/Cr ratio and is on PPI BID. Given her age, no plans on colonoscopy unless she has frank hematochezia where  an endoscopic intervention may alter her clinical course. Will get a CT of abdomen and pelvis with contrast(normal renal function) to evaluate for structural cause of anemia. If unremarkable, recommend change diet from full liquid to regular. Recommend periodic IV iron infusions and to avoid NSAIDs. Continue PPI. Discussed with the patient, she verbalized understanding.  Ronnette Juniper 06/19/2019, 3:19 PM   Pager [phone] If no answer or after 5 PM call [phone]

## 2019-06-20 ENCOUNTER — Inpatient Hospital Stay (HOSPITAL_COMMUNITY): Payer: Medicare Other

## 2019-06-20 LAB — CBC WITH DIFFERENTIAL/PLATELET
Abs Immature Granulocytes: 0.06 10*3/uL (ref 0.00–0.07)
Abs Immature Granulocytes: 0.04 10*3/uL (ref 0.00–0.07)
Abs Immature Granulocytes: 0.04 10*3/uL (ref 0.00–0.07)
Abs Immature Granulocytes: 0.05 10*3/uL (ref 0.00–0.07)
Basophils Absolute: 0 10*3/uL (ref 0.0–0.1)
Basophils Absolute: 0 10*3/uL (ref 0.0–0.1)
Basophils Absolute: 0 10*3/uL (ref 0.0–0.1)
Basophils Absolute: 0 10*3/uL (ref 0.0–0.1)
Basophils Relative: 0 %
Basophils Relative: 1 %
Basophils Relative: 1 %
Basophils Relative: 1 %
Eosinophils Absolute: 0.5 10*3/uL (ref 0.0–0.5)
Eosinophils Absolute: 0.3 10*3/uL (ref 0.0–0.5)
Eosinophils Absolute: 0.3 10*3/uL (ref 0.0–0.5)
Eosinophils Absolute: 0.3 10*3/uL (ref 0.0–0.5)
Eosinophils Relative: 4 %
Eosinophils Relative: 3 %
Eosinophils Relative: 3 %
Eosinophils Relative: 4 %
HCT: 30.2 % — ABNORMAL LOW (ref 36.0–46.0)
HCT: 33.1 % — ABNORMAL LOW (ref 36.0–46.0)
HCT: 34.6 % — ABNORMAL LOW (ref 36.0–46.0)
HCT: 32.3 % — ABNORMAL LOW (ref 36.0–46.0)
Hemoglobin: 9.3 g/dL — ABNORMAL LOW (ref 12.0–15.0)
Hemoglobin: 10.1 g/dL — ABNORMAL LOW (ref 12.0–15.0)
Hemoglobin: 10.6 g/dL — ABNORMAL LOW (ref 12.0–15.0)
Hemoglobin: 9.9 g/dL — ABNORMAL LOW (ref 12.0–15.0)
Immature Granulocytes: 1 %
Immature Granulocytes: 1 %
Immature Granulocytes: 1 %
Immature Granulocytes: 1 %
Lymphocytes Relative: 13 %
Lymphocytes Relative: 15 %
Lymphocytes Relative: 14 %
Lymphocytes Relative: 15 %
Lymphs Abs: 1.4 10*3/uL (ref 0.7–4.0)
Lymphs Abs: 1.3 10*3/uL (ref 0.7–4.0)
Lymphs Abs: 1.2 10*3/uL (ref 0.7–4.0)
Lymphs Abs: 1.2 10*3/uL (ref 0.7–4.0)
MCH: 26.7 pg (ref 26.0–34.0)
MCH: 26.6 pg (ref 26.0–34.0)
MCH: 26.6 pg (ref 26.0–34.0)
MCH: 26.8 pg (ref 26.0–34.0)
MCHC: 30.8 g/dL (ref 30.0–36.0)
MCHC: 30.5 g/dL (ref 30.0–36.0)
MCHC: 30.6 g/dL (ref 30.0–36.0)
MCHC: 30.7 g/dL (ref 30.0–36.0)
MCV: 86.8 fL (ref 80.0–100.0)
MCV: 87.1 fL (ref 80.0–100.0)
MCV: 86.9 fL (ref 80.0–100.0)
MCV: 87.3 fL (ref 80.0–100.0)
Monocytes Absolute: 0.9 10*3/uL (ref 0.1–1.0)
Monocytes Absolute: 0.6 10*3/uL (ref 0.1–1.0)
Monocytes Absolute: 0.6 10*3/uL (ref 0.1–1.0)
Monocytes Absolute: 0.8 10*3/uL (ref 0.1–1.0)
Monocytes Relative: 8 %
Monocytes Relative: 7 %
Monocytes Relative: 7 %
Monocytes Relative: 9 %
Neutro Abs: 8.1 10*3/uL — ABNORMAL HIGH (ref 1.7–7.7)
Neutro Abs: 6.5 10*3/uL (ref 1.7–7.7)
Neutro Abs: 6.5 10*3/uL (ref 1.7–7.7)
Neutro Abs: 5.7 10*3/uL (ref 1.7–7.7)
Neutrophils Relative %: 74 %
Neutrophils Relative %: 73 %
Neutrophils Relative %: 74 %
Neutrophils Relative %: 70 %
Platelets: 485 10*3/uL — ABNORMAL HIGH (ref 150–400)
Platelets: 489 10*3/uL — ABNORMAL HIGH (ref 150–400)
Platelets: 474 10*3/uL — ABNORMAL HIGH (ref 150–400)
Platelets: 486 10*3/uL — ABNORMAL HIGH (ref 150–400)
RBC: 3.48 MIL/uL — ABNORMAL LOW (ref 3.87–5.11)
RBC: 3.8 MIL/uL — ABNORMAL LOW (ref 3.87–5.11)
RBC: 3.98 MIL/uL (ref 3.87–5.11)
RBC: 3.7 MIL/uL — ABNORMAL LOW (ref 3.87–5.11)
RDW: 15.7 % — ABNORMAL HIGH (ref 11.5–15.5)
RDW: 15.8 % — ABNORMAL HIGH (ref 11.5–15.5)
RDW: 15.8 % — ABNORMAL HIGH (ref 11.5–15.5)
RDW: 16 % — ABNORMAL HIGH (ref 11.5–15.5)
WBC: 10.9 10*3/uL — ABNORMAL HIGH (ref 4.0–10.5)
WBC: 8.8 10*3/uL (ref 4.0–10.5)
WBC: 8.7 10*3/uL (ref 4.0–10.5)
WBC: 8.1 10*3/uL (ref 4.0–10.5)
nRBC: 0 % (ref 0.0–0.2)
nRBC: 0 % (ref 0.0–0.2)
nRBC: 0 % (ref 0.0–0.2)
nRBC: 0 % (ref 0.0–0.2)

## 2019-06-20 LAB — BASIC METABOLIC PANEL
Anion gap: 10 (ref 5–15)
BUN: 7 mg/dL — ABNORMAL LOW (ref 8–23)
CO2: 31 mmol/L (ref 22–32)
Calcium: 9.2 mg/dL (ref 8.9–10.3)
Chloride: 99 mmol/L (ref 98–111)
Creatinine, Ser: 0.8 mg/dL (ref 0.44–1.00)
GFR calc Af Amer: >60 mL/min (ref >60)
GFR calc non Af Amer: >60 mL/min (ref >60)
Glucose, Bld: 109 mg/dL — ABNORMAL HIGH (ref 70–99)
Potassium: 3.6 mmol/L (ref 3.5–5.1)
Sodium: 140 mmol/L (ref 135–145)

## 2019-06-20 MED ORDER — PSYLLIUM 95 % PO PACK
1.0000 | PACK | Freq: Two times a day (BID) | ORAL | Status: DC
  Administered 2019-06-20: 1 via ORAL
  Filled 2019-06-20: qty 1

## 2019-06-20 MED ORDER — ACETAMINOPHEN 325 MG PO TABS: 650.0000 mg | ORAL_TABLET | Freq: Four times a day (QID) | ORAL | Status: DC | PRN

## 2019-06-20 MED ORDER — PSYLLIUM 95 % PO PACK: 1.0000 | PACK | Freq: Two times a day (BID) | ORAL | Status: DC

## 2019-06-20 NOTE — Progress Notes (Signed)
Bladder scan done: 241cc

## 2019-06-20 NOTE — Discharge Summary (Signed)
Physician Discharge Summary  [name] TKW:[id number] DOB: [birth year] DOA: 06/17/2019  PCP: [name], MD  Admit date: 06/17/2019 Discharge date: 06/20/2019  Admitted From: Home  disposition: Home  Recommendations for Outpatient Follow-up:  1. Follow up with PCP in 1-2 weeks 2. Please obtain BMP/CBC in one week 3. Please follow-up with GI Dr. Therisa Doyne 4. Follow-up with urology Dr. Louis Meckel  Home Health none Equipment/Devices none Discharge Condition: Stable and improved CODE STATUS: Full code Diet recommendation: Cardiac diet Brief/Interim Summary:83 y.o.femalewith medical history significant ofrecent GI bleed, recurrent pneumonias, osteoarthritis, chronic back pain with compression fractures, COPD, hypertension, chronic respiratory failure on oxygen, osteoporosis who was sent over by her PCP due to pain in the left leg and tenderness suspected DVT. Patient was seen and evaluated and found to have a hemoglobin of 7.1 in the ER. She was also still guaiac positive. With her recent GI bleed patient was further evaluated. Hemoglobin dropped to 6.4. Appears to have sinus tachycardia with no evidence of DVT. Doppler ultrasound not available at this time of the night. Patient is being admitted to the hospital with another GI bleed as well as left leg pain. Denied any diarrhea. No nausea or vomiting. Patient denies any bright red blood per rectum. Her pain is in the left knee is about 5 out of 10 at the moment. It has persisted. She has been unable to put weight on the leg..  ED Course:Temperature is 98.5 blood pressure 146/47 pulse 112 respiratory of 24 and oxygen sat 95% on 2 L. Sodium 142 potassium is 3.4 chloride 100 CO2 32 2. Glucose is 111. She has a normal white count of 8.8 with hemoglobin 7.1 and platelets 629. X-ray of the left knee shows mild to moderate osteoarthritis. Moderate joint effusion and scattered sclerotic densities in the distal femur and proximal  tibia probably bone islands. Bone infarcts also not ruled out. Patient is being admitted for treatment.Stool guaiac is positive Discharge Diagnoses:  Principal Problem:   Acute blood loss anemia Active Problems:   Hypokalemia   Hypertension   COPD (chronic obstructive pulmonary disease) (HCC)   Tobacco abuse   Chronic respiratory failure (HCC)   GI bleed   Left knee pain  #1 GI bleed patient history of recent GI bleed EGD in May 2020 shows antral gastritis biopsied still with ongoing dark stools with a hemoglobin down to 6.4 at the time of this admission. She has received 2 units of packed RBCs overnight. Her hemoglobin  up to 10.1.  Patient was seen in consultation by GI recommended to avoid any NSAIDs and to continue PPI.  Will hold aspirin for 2 weeks and restart in 2 weeks if there is no further bleeding.  #2 left knee pain and swelling x-ray done shows osteoarthritis, moderate joint effusion, scattered sclerotic densities in the distal femur and proximal tibia likely bone islands however possibility of bone infarct is raised. No history of recent falls.continue Tylenol 3.  PT OT home physical therapy.  #3 O2 dependent COPDcontinue inhalers  #4 hypertension continue Norvasc.  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  #6 chronic pain looking through her old records oxycodone caused her to have confusion and lethargy and Tylenol 3 was effective for pain management.  Pressure Injury 05/08/19 Stage II -  Partial thickness loss of dermis presenting as a shallow open ulcer with a red, pink wound bed without slough. Device related pressure  injury from chronic oxygen use and wearing glasses- patient states it has been pre (Active)  05/08/19 1848  Location: Ear  Location Orientation: Left  Staging: Stage II -  Partial thickness loss of dermis presenting as a shallow open ulcer with a red,  pink wound bed without slough.  Wound Description (Comments): Device related pressure injury from chronic oxygen use and wearing glasses- patient states it has been present for a long time  Present on Admission: Yes     Pressure Injury 05/25/19 Nose Left;Other (Comment) Stage II -  Partial thickness loss of dermis presenting as a shallow open ulcer with a red, pink wound bed without slough. stage 2 pressure injury caused by glasses on left side of nose (Active)  05/25/19 1701  Location: Nose  Location Orientation: Left;Other (Comment)  Staging: Stage II -  Partial thickness loss of dermis presenting as a shallow open ulcer with a red, pink wound bed without slough.  Wound Description (Comments): stage 2 pressure injury caused by glasses on left side of nose  Present on Admission:     Estimated body mass index is 32.11 kg/m as calculated from the following:   Height as of this encounter: 4\' 6"  (1.372 m).   Weight as of this encounter: 60.4 kg.  Discharge Instructions   Allergies as of 06/20/2019      Reactions   Ambien [zolpidem] Other (See Comments)   Hallucinations and paranoid   Codeine Hives, Nausea And Vomiting      Medication List    STOP taking these medications   acetaminophen-codeine 300-30 MG tablet Commonly known as: TYLENOL #3   aspirin 81 MG EC tablet   docusate sodium 250 MG capsule Commonly known as: COLACE   DULoxetine 30 MG capsule Commonly known as: CYMBALTA   lidocaine 5 % Commonly known as: LIDODERM   naproxen sodium 220 MG tablet Commonly known as: ALEVE     TAKE these medications   acetaminophen 325 MG tablet Commonly known as: TYLENOL Take 2 tablets (650 mg total) by mouth every 6 (six) hours as needed for moderate pain. What changed:   medication strength  how much to take  when to take this  reasons to take this   amLODipine 5 MG tablet Commonly known as: NORVASC Take 1 tablet (5 mg total) by mouth daily.   atorvastatin 20 MG  tablet Commonly known as: LIPITOR Take 1 tablet (20 mg total) by mouth daily at 6 PM.   cyanocobalamin 1000 MCG tablet Take 1 tablet (1,000 mcg total) by mouth daily.   diclofenac sodium 1 % Gel Commonly known as: VOLTAREN Apply 2 g topically 4 (four) times daily as needed (pain).   gabapentin 100 MG capsule Commonly known as: NEURONTIN Take 1 capsule (100 mg total) by mouth at bedtime.   Muscle Rub 10-15 % Crea Apply 1 application topically 2 (two) times daily as needed for muscle pain.   oxyCODONE-acetaminophen 7.5-325 MG tablet Commonly known as: PERCOCET Take 1 tablet by mouth 2 (two) times daily as needed for severe pain.   pantoprazole 40 MG tablet Commonly known as: PROTONIX TAKE 1 TABLET BY MOUTH DAILY   psyllium 95 % Pack Commonly known as: HYDROCIL/METAMUCIL Take 1 packet by mouth 2 (two) times daily.   senna-docusate 8.6-50 MG tablet Commonly known as: Senokot-S Take 2 tablets by mouth 2 (two) times daily. What changed:   when to take this  reasons to take this      Follow-up Information  [name], MD Follow up.   Specialty: Family Medicine Contact information: [place] [address] [phone]        Ardis Hughs, MD Follow up.   Specialty: Urology Contact information: [place] [address] [phone]        Ronnette Juniper, MD Follow up.   Specialty: Gastroenterology Contact information: [address] [place] [address] [phone]          Allergies  Allergen Reactions  . Ambien [Zolpidem] Other (See Comments)    Hallucinations and paranoid  . Codeine Hives and Nausea And Vomiting    Consultations: DR Therisa Doyne Procedures/Studies: Dg Knee 2 Views Left  Result Date: 06/17/2019 CLINICAL DATA:  Left knee pain and swelling. EXAM: LEFT KNEE - 1-2 VIEW COMPARISON:  None. FINDINGS: No fracture, dislocation, or bony destructive change. There scattered  heterogeneous primarily sclerotic densities in the distal femur and proximal tibia. Medial compartment joint space narrowing with peripheral spurring. Small patellofemoral spurs. Moderate joint effusion. Mild generalized soft tissue edema. IMPRESSION: 1. Mild to moderate osteoarthritis. 2. Moderate joint effusion. 3. Scattered sclerotic densities in the distal femur and proximal tibia are likely bone islands, however possibly of bone infarcts is raised. Electronically Signed   By: Keith Rake M.D.   On: 06/17/2019 20:43   Ct Abdomen Pelvis W Contrast  Result Date: 06/20/2019 CLINICAL DATA:  Anemia chronic disease. EXAM: CT ABDOMEN AND PELVIS WITH CONTRAST TECHNIQUE: Multidetector CT imaging of the abdomen and pelvis was performed using the standard protocol following bolus administration of intravenous contrast. CONTRAST:  114mL OMNIPAQUE IOHEXOL 300 MG/ML  SOLN COMPARISON:  CT 03/19/2014 FINDINGS: Lower chest: Left lower lobe atelectasis. No pleural fluid. Mild elevation of left hemidiaphragm. Hepatobiliary: No focal liver abnormality is seen. No gallstones, gallbladder wall thickening, or biliary dilatation. Pancreas: No ductal dilatation or inflammation. Spleen: Normal in size without focal abnormality. Adrenals/Urinary Tract: Normal adrenal glands. Left hydroureteronephrosis. Ureter is dilated to the level of the pelvis were there is ureteral thickening and enhancement, image 48 series 3. More distal ureter is decompressed. Hydronephrosis is progressed from 2015 CT. There is diminished excretion on delayed phase imaging from the left kidney. Nonobstructing stones in the left kidney, at least 4, largest measuring 9 mm. No right hydronephrosis. Right ureter is decompressed. Urinary bladder is partially distended, no definite bladder wall thickening. Stomach/Bowel: Mild colonic diverticulosis. No colonic wall thickening or diverticulitis. Small to moderate stool burden throughout the colon. Administered  enteric contrast reaches the colon. There is colonic tortuosity. High-riding sigmoid colon in the right mid abdomen. Appendix not confidently visualized. Unremarkable small bowel without wall thickening. Stomach is nondistended. Vascular/Lymphatic: Aortic atherosclerosis and tortuosity. No aneurysm. No enlarged lymph nodes in the abdomen and pelvis. Small retroperitoneal nodes at the level of the left kidney measures 6 mm. Reproductive: Unchanged left ovarian/adnexal cyst measuring 2.7 cm. Right ovary not visualized. Uterus surgically absent. Other: Small fat containing umbilical hernia. Small fat containing supraumbilical hernia No free air, free fluid, or intra-abdominal fluid collection. Musculoskeletal: Chronic compression fractures of T11, L1, and L3 with vertebroplasty. Mild L2 compression fracture. Bilateral sacroplasty. IMPRESSION: 1. Moderate left hydronephrosis and proximal hydroureter. There is ureteral thickening and enhancement in the region of the distal ureter. Differential considerations include stricture or transitional neoplasm, recommend direct visualization with cystoscopy. 2. Nonobstructing left nephrolithiasis. 3. Colonic diverticulosis without diverticulitis. 4. Fat containing umbilical hernia. Fat containing supraumbilical hernia. 5.  Aortic Atherosclerosis (ICD10-I70.0). Electronically Signed  By: Keith Rake M.D.   On: 06/20/2019 02:15   US Renal  Result Date: 06/20/2019 CLINICAL DATA:  Hydronephrosis EXAM: RENAL / URINARY TRACT ULTRASOUND COMPLETE COMPARISON:  CT abdomen pelvis 06/19/2019 FINDINGS: Right Kidney: Renal measurements: 10.9 x 4.7 x 5.0 cm = volume: 139 mL. Normal cortical thickness. Upper normal cortical echogenicity. No mass, hydronephrosis or shadowing calcification Left Kidney: Renal measurements: 11.8 x 6.3 x 5.4 cm = volume: 210 mL. Normal cortical thickness. Upper normal cortical echogenicity. Moderate hydronephrosis. No mass or shadowing calcification.  Dilatation of LEFT ureter seen on prior CT not visualized. Bladder: Appears normal for degree of bladder distention. RIGHT ureteral jet was visualized but no LEFT ureteral jet was seen during imaging. IMPRESSION: Moderate LEFT hydronephrosis with absence of a LEFT ureteral jet at the urinary bladder. Electronically Signed   By: Lavonia Dana M.D.   On: 06/20/2019 09:22   Vas [place] Lower Extremity Venous (dvt) (only [facility])  Result Date: 06/18/2019  Lower Venous Study Indications: Edema.  Comparison Study: Prior negative study done 05/04/19 is available for comparison Performing Technologist: Sharion Dove RVS  Examination Guidelines: A complete evaluation includes B-mode imaging, spectral Doppler, color Doppler, and power Doppler as needed of all accessible portions of each vessel. Bilateral testing is considered an integral part of a complete examination. Limited examinations for reoccurring indications may be performed as noted.  +-----+---------------+---------+-----------+----------+-------+ RIGHTCompressibilityPhasicitySpontaneityPropertiesSummary +-----+---------------+---------+-----------+----------+-------+ CFV  Full           Yes      Yes                          +-----+---------------+---------+-----------+----------+-------+   +---------+---------------+---------+-----------+----------+-------+ LEFT     CompressibilityPhasicitySpontaneityPropertiesSummary +---------+---------------+---------+-----------+----------+-------+ CFV      Full           Yes      Yes                          +---------+---------------+---------+-----------+----------+-------+ SFJ      Full                                                 +---------+---------------+---------+-----------+----------+-------+ FV Prox  Full                                                 +---------+---------------+---------+-----------+----------+-------+ FV Mid   Full                                                  +---------+---------------+---------+-----------+----------+-------+ FV DistalFull                                                 +---------+---------------+---------+-----------+----------+-------+ PFV      Full                                                 +---------+---------------+---------+-----------+----------+-------+  POP      Full           Yes      No                           +---------+---------------+---------+-----------+----------+-------+ PTV      Full                                                 +---------+---------------+---------+-----------+----------+-------+ PERO     Full                                                 +---------+---------------+---------+-----------+----------+-------+     Summary: Right: No evidence of common femoral vein obstruction. There is no evidence of deep vein thrombosis in the lower extremity. Left: No evidence of deep vein thrombosis in the lower extremity. No indirect evidence of obstruction proximal to the inguinal ligament.  *See table(s) above for measurements and observations. Electronically signed by Monica Martinez MD on 06/18/2019 at 3:12:25 PM.    Final     (Echo, Carotid, EGD, Colonoscopy, ERCP)    Subjective: FEELS OK C/O KNEE PAIN  Discharge Exam: Vitals:   06/20/19 0322 06/20/19 0817  BP: (!) 150/66 137/68  Pulse: 94 92  Resp: 18 18  Temp: 98.4 F (36.9 C)   SpO2: 95%    Vitals:   06/19/19 1556 06/19/19 1940 06/20/19 0322 06/20/19 0817  BP: (!) 153/78 (!) 148/66 (!) 150/66 137/68  Pulse: (!) 104 90 94 92  Resp: 19 18 18 18   Temp: 98.4 F (36.9 C) 98 F (36.7 C) 98.4 F (36.9 C)   TempSrc: Oral Oral Oral   SpO2: 94% 96% 95%   Weight:   60.4 kg   Height:        General: Pt is alert, awake, not in acute distress Cardiovascular: RRR, S1/S2 +, no rubs, no gallops Respiratory: CTA bilaterally, no wheezing, no rhonchi Abdominal: Soft, NT, ND, bowel sounds + Extremities:  no edema, no cyanosis    The results of significant diagnostics from this hospitalization (including imaging, microbiology, ancillary and laboratory) are listed below for reference.     Microbiology: No results found for this or any previous visit (from the past 240 hour(s)).   Labs: BNP (last 3 results) No results for input(s): BNP in the last 8760 hours. Basic Metabolic Panel: Recent Labs  Lab 06/17/19 2017 06/18/19 0759  NA 142 141  K 3.4* 3.4*  CL 100 100  CO2 32 32  GLUCOSE 111* 112*  BUN 19 17  CREATININE 0.99 0.82  CALCIUM 8.8* 8.5*   Liver Function Tests: Recent Labs  Lab 06/18/19 0759  AST 13*  ALT 10  ALKPHOS 74  BILITOT 0.9  PROT 6.0*  ALBUMIN 2.5*   No results for input(s): LIPASE, AMYLASE in the last 168 hours. No results for input(s): AMMONIA in the last 168 hours. CBC: Recent Labs  Lab 06/19/19 0715 06/19/19 1218 06/19/19 1847 06/20/19 0043 06/20/19 0746  WBC 8.9 8.9 8.4 10.9* 8.8  NEUTROABS 6.2 6.3 6.3 8.1* 6.5  HGB 9.2* 9.7* 9.8* 9.3* 10.1*  HCT 29.8* 31.0* 32.1* 30.2* 33.1*  MCV 87.1 86.4 86.8 86.8  87.1  PLT 500* 503* 486* 485* 489*   Cardiac Enzymes: No results for input(s): CKTOTAL, CKMB, CKMBINDEX, TROPONINI in the last 168 hours. BNP: Invalid input(s): POCBNP CBG: No results for input(s): GLUCAP in the last 168 hours. D-Dimer No results for input(s): DDIMER in the last 72 hours. Hgb A1c No results for input(s): HGBA1C in the last 72 hours. Lipid Profile No results for input(s): CHOL, HDL, LDLCALC, TRIG, CHOLHDL, LDLDIRECT in the last 72 hours. Thyroid function studies No results for input(s): TSH, T4TOTAL, T3FREE, THYROIDAB in the last 72 hours.  Invalid input(s): FREET3 Anemia work up No results for input(s): VITAMINB12, FOLATE, FERRITIN, TIBC, IRON, RETICCTPCT in the last 72 hours. Urinalysis    Component Value Date/Time   COLORURINE YELLOW 08/18/2018 [place] 08/18/2018 1820   LABSPEC 1.019  08/18/2018 1820   PHURINE 5.0 08/18/2018 1820   GLUCOSEU NEGATIVE 08/18/2018 1820   HGBUR NEGATIVE 08/18/2018 1820   BILIRUBINUR NEGATIVE 08/18/2018 1820   KETONESUR 5 (A) 08/18/2018 1820   PROTEINUR 30 (A) 08/18/2018 1820   UROBILINOGEN 0.2 03/19/2014 1657   NITRITE NEGATIVE 08/18/2018 1820   LEUKOCYTESUR TRACE (A) 08/18/2018 1820   Sepsis Labs Invalid input(s): PROCALCITONIN,  WBC,  LACTICIDVEN Microbiology No results found for this or any previous visit (from the past 240 hour(s)).   Time coordinating discharge: 33 minutes  SIGNED:   Georgette Shell, MD  [facility] 06/20/2019, 10:18 AM Pager   If 7PM-7AM, please contact night-coverage [email or web address] Password [email or web address]

## 2019-06-20 NOTE — Care Management Important Message (Signed)
Important Message  Patient Details  Name: [name] MRN: [id number] Date of Birth: 1936-09-29   Medicare Important Message Given:  Yes     Shelda Altes 06/20/2019, 11:37 AM

## 2019-06-20 NOTE — Progress Notes (Signed)
Subjective: Continues to complain of left knee pain.   Objective: Vital signs in last 24 hours: Temp:  [98 F (36.7 C)-98.4 F (36.9 C)] 98.4 F (36.9 C) (06/23 0322) Pulse Rate:  [90-104] 92 (06/23 0817) Resp:  [18-19] 18 (06/23 0817) BP: (137-153)/(66-80) 137/68 (06/23 0817) SpO2:  [94 %-96 %] 95 % (06/23 0322) Weight:  [60.4 kg] 60.4 kg (06/23 0322) Weight change: -0.6 kg Last BM Date: 06/17/19  PE: Elderly, frail, on oxygen via nasal cannula GENERAL: Able to speak in few words, not full sentences ABDOMEN: Small umbilical hernia, tender to touch, otherwise abdomen soft, normoactive bowel sounds EXTREMITIES: Left knee slightly swollen than right  Lab Results: Results for orders placed or performed during the hospital encounter of 06/17/19 (from the past 48 hour(s))  CBC WITH DIFFERENTIAL     Status: Abnormal   Collection Time: 06/18/19  4:43 PM  Result Value Ref Range   WBC 9.1 4.0 - 10.5 K/uL   RBC 3.58 (L) 3.87 - 5.11 MIL/uL   Hemoglobin 9.6 (L) 12.0 - 15.0 g/dL   HCT 31.0 (L) 36.0 - 46.0 %   MCV 86.6 80.0 - 100.0 fL   MCH 26.8 26.0 - 34.0 pg   MCHC 31.0 30.0 - 36.0 g/dL   RDW 14.9 11.5 - 15.5 %   Platelets 537 (H) 150 - 400 K/uL   nRBC 0.0 0.0 - 0.2 %   Neutrophils Relative % 70 %   Neutro Abs 6.5 1.7 - 7.7 K/uL   Lymphocytes Relative 17 %   Lymphs Abs 1.5 0.7 - 4.0 K/uL   Monocytes Relative 6 %   Monocytes Absolute 0.6 0.1 - 1.0 K/uL   Eosinophils Relative 5 %   Eosinophils Absolute 0.5 0.0 - 0.5 K/uL   Basophils Relative 1 %   Basophils Absolute 0.1 0.0 - 0.1 K/uL   Immature Granulocytes 1 %   Abs Immature Granulocytes 0.07 0.00 - 0.07 K/uL    Comment: Performed at [facility], [address] [place], [place] [address]  CBC WITH DIFFERENTIAL     Status: Abnormal   Collection Time: 06/18/19  6:49 PM  Result Value Ref Range   WBC 8.6 4.0 - 10.5 K/uL   RBC 3.41 (L) 3.87 - 5.11 MIL/uL   Hemoglobin 9.2 (L) 12.0 - 15.0 g/dL   HCT 29.5 (L) 36.0 - 46.0  %   MCV 86.5 80.0 - 100.0 fL   MCH 27.0 26.0 - 34.0 pg   MCHC 31.2 30.0 - 36.0 g/dL   RDW 14.9 11.5 - 15.5 %   Platelets 513 (H) 150 - 400 K/uL   nRBC 0.0 0.0 - 0.2 %   Neutrophils Relative % 67 %   Neutro Abs 5.9 1.7 - 7.7 K/uL   Lymphocytes Relative 18 %   Lymphs Abs 1.5 0.7 - 4.0 K/uL   Monocytes Relative 8 %   Monocytes Absolute 0.7 0.1 - 1.0 K/uL   Eosinophils Relative 5 %   Eosinophils Absolute 0.4 0.0 - 0.5 K/uL   Basophils Relative 1 %   Basophils Absolute 0.0 0.0 - 0.1 K/uL   Immature Granulocytes 1 %   Abs Immature Granulocytes 0.05 0.00 - 0.07 K/uL    Comment: Performed at [facility] [address] [place], [place] [address]  CBC WITH DIFFERENTIAL     Status: Abnormal   Collection Time: 06/19/19  1:01 AM  Result Value Ref Range   WBC 8.5 4.0 - 10.5 K/uL  RBC 3.17 (L) 3.87 - 5.11 MIL/uL   Hemoglobin 8.4 (L) 12.0 - 15.0 g/dL   HCT 27.3 (L) 36.0 - 46.0 %   MCV 86.1 80.0 - 100.0 fL   MCH 26.5 26.0 - 34.0 pg   MCHC 30.8 30.0 - 36.0 g/dL   RDW 15.1 11.5 - 15.5 %   Platelets 477 (H) 150 - 400 K/uL   nRBC 0.0 0.0 - 0.2 %   Neutrophils Relative % 64 %   Neutro Abs 5.5 1.7 - 7.7 K/uL   Lymphocytes Relative 20 %   Lymphs Abs 1.7 0.7 - 4.0 K/uL   Monocytes Relative 8 %   Monocytes Absolute 0.7 0.1 - 1.0 K/uL   Eosinophils Relative 6 %   Eosinophils Absolute 0.5 0.0 - 0.5 K/uL   Basophils Relative 1 %   Basophils Absolute 0.1 0.0 - 0.1 K/uL   Immature Granulocytes 1 %   Abs Immature Granulocytes 0.04 0.00 - 0.07 K/uL    Comment: Performed at [facility] [address] [place], [place] [address]  CBC with Differential/Platelet     Status: Abnormal   Collection Time: 06/19/19  7:15 AM  Result Value Ref Range   WBC 8.9 4.0 - 10.5 K/uL   RBC 3.42 (L) 3.87 - 5.11 MIL/uL   Hemoglobin 9.2 (L) 12.0 - 15.0 g/dL   HCT 29.8 (L) 36.0 - 46.0 %   MCV 87.1 80.0 - 100.0 fL   MCH 26.9 26.0 - 34.0 pg   MCHC 30.9 30.0 - 36.0 g/dL   RDW 15.4 11.5 - 15.5 %    Platelets 500 (H) 150 - 400 K/uL   nRBC 0.0 0.0 - 0.2 %   Neutrophils Relative % 70 %   Neutro Abs 6.2 1.7 - 7.7 K/uL   Lymphocytes Relative 17 %   Lymphs Abs 1.5 0.7 - 4.0 K/uL   Monocytes Relative 7 %   Monocytes Absolute 0.6 0.1 - 1.0 K/uL   Eosinophils Relative 5 %   Eosinophils Absolute 0.5 0.0 - 0.5 K/uL   Basophils Relative 0 %   Basophils Absolute 0.0 0.0 - 0.1 K/uL   Immature Granulocytes 1 %   Abs Immature Granulocytes 0.04 0.00 - 0.07 K/uL    Comment: Performed at [facility] [address] [place],  [address]  CBC WITH DIFFERENTIAL     Status: Abnormal   Collection Time: 06/19/19 12:18 PM  Result Value Ref Range   WBC 8.9 4.0 - 10.5 K/uL   RBC 3.59 (L) 3.87 - 5.11 MIL/uL   Hemoglobin 9.7 (L) 12.0 - 15.0 g/dL   HCT 31.0 (L) 36.0 - 46.0 %   MCV 86.4 80.0 - 100.0 fL   MCH 27.0 26.0 - 34.0 pg   MCHC 31.3 30.0 - 36.0 g/dL   RDW 15.2 11.5 - 15.5 %   Platelets 503 (H) 150 - 400 K/uL   nRBC 0.0 0.0 - 0.2 %   Neutrophils Relative % 71 %   Neutro Abs 6.3 1.7 - 7.7 K/uL   Lymphocytes Relative 16 %   Lymphs Abs 1.5 0.7 - 4.0 K/uL   Monocytes Relative 7 %   Monocytes Absolute 0.6 0.1 - 1.0 K/uL   Eosinophils Relative 5 %   Eosinophils Absolute 0.4 0.0 - 0.5 K/uL   Basophils Relative 0 %   Basophils Absolute 0.0 0.0 - 0.1 K/uL   Immature Granulocytes 1 %   Abs Immature Granulocytes 0.05 0.00 - 0.07 K/uL  Comment: Performed at [facility], [place] [address], [place], [place] [address]  CBC WITH DIFFERENTIAL     Status: Abnormal   Collection Time: 06/19/19  6:47 PM  Result Value Ref Range   WBC 8.4 4.0 - 10.5 K/uL   RBC 3.70 (L) 3.87 - 5.11 MIL/uL   Hemoglobin 9.8 (L) 12.0 - 15.0 g/dL   HCT 32.1 (L) 36.0 - 46.0 %   MCV 86.8 80.0 - 100.0 fL   MCH 26.5 26.0 - 34.0 pg   MCHC 30.5 30.0 - 36.0 g/dL   RDW 15.4 11.5 - 15.5 %   Platelets 486 (H) 150 - 400 K/uL   nRBC 0.0 0.0 - 0.2 %   Neutrophils Relative % 75 %   Neutro Abs 6.3 1.7 - 7.7 K/uL    Lymphocytes Relative 13 %   Lymphs Abs 1.1 0.7 - 4.0 K/uL   Monocytes Relative 7 %   Monocytes Absolute 0.6 0.1 - 1.0 K/uL   Eosinophils Relative 4 %   Eosinophils Absolute 0.3 0.0 - 0.5 K/uL   Basophils Relative 1 %   Basophils Absolute 0.0 0.0 - 0.1 K/uL   Immature Granulocytes 0 %   Abs Immature Granulocytes 0.03 0.00 - 0.07 K/uL    Comment: Performed at [facility] [address] [place], [place] [address]  CBC WITH DIFFERENTIAL     Status: Abnormal   Collection Time: 06/20/19 12:43 AM  Result Value Ref Range   WBC 10.9 (H) 4.0 - 10.5 K/uL   RBC 3.48 (L) 3.87 - 5.11 MIL/uL   Hemoglobin 9.3 (L) 12.0 - 15.0 g/dL   HCT 30.2 (L) 36.0 - 46.0 %   MCV 86.8 80.0 - 100.0 fL   MCH 26.7 26.0 - 34.0 pg   MCHC 30.8 30.0 - 36.0 g/dL   RDW 15.7 (H) 11.5 - 15.5 %   Platelets 485 (H) 150 - 400 K/uL   nRBC 0.0 0.0 - 0.2 %   Neutrophils Relative % 74 %   Neutro Abs 8.1 (H) 1.7 - 7.7 K/uL   Lymphocytes Relative 13 %   Lymphs Abs 1.4 0.7 - 4.0 K/uL   Monocytes Relative 8 %   Monocytes Absolute 0.9 0.1 - 1.0 K/uL   Eosinophils Relative 4 %   Eosinophils Absolute 0.5 0.0 - 0.5 K/uL   Basophils Relative 0 %   Basophils Absolute 0.0 0.0 - 0.1 K/uL   Immature Granulocytes 1 %   Abs Immature Granulocytes 0.06 0.00 - 0.07 K/uL    Comment: Performed at [facility], [address], [place], [place] [address]  CBC WITH DIFFERENTIAL     Status: Abnormal   Collection Time: 06/20/19  7:46 AM  Result Value Ref Range   WBC 8.8 4.0 - 10.5 K/uL   RBC 3.80 (L) 3.87 - 5.11 MIL/uL   Hemoglobin 10.1 (L) 12.0 - 15.0 g/dL   HCT 33.1 (L) 36.0 - 46.0 %   MCV 87.1 80.0 - 100.0 fL   MCH 26.6 26.0 - 34.0 pg   MCHC 30.5 30.0 - 36.0 g/dL   RDW 15.8 (H) 11.5 - 15.5 %   Platelets 489 (H) 150 - 400 K/uL   nRBC 0.0 0.0 - 0.2 %   Neutrophils Relative % 73 %   Neutro Abs 6.5 1.7 - 7.7 K/uL   Lymphocytes Relative 15 %   Lymphs Abs 1.3 0.7 - 4.0 K/uL   Monocytes Relative 7 %   Monocytes Absolute 0.6  0.1 - 1.0 K/uL   Eosinophils Relative 3 %   Eosinophils Absolute 0.3 0.0 - 0.5 K/uL   Basophils Relative 1 %   Basophils Absolute 0.0 0.0 - 0.1 K/uL   Immature Granulocytes 1 %   Abs Immature Granulocytes 0.04 0.00 - 0.07 K/uL    Comment: Performed at [facility] [address], [place], [place] [address]    Studies/Results: Ct Abdomen Pelvis W Contrast  Result Date: 06/20/2019 CLINICAL DATA:  Anemia chronic disease. EXAM: CT ABDOMEN AND PELVIS WITH CONTRAST TECHNIQUE: Multidetector CT imaging of the abdomen and pelvis was performed using the standard protocol following bolus administration of intravenous contrast. CONTRAST:  136mL OMNIPAQUE IOHEXOL 300 MG/ML  SOLN COMPARISON:  CT 03/19/2014 FINDINGS: Lower chest: Left lower lobe atelectasis. No pleural fluid. Mild elevation of left hemidiaphragm. Hepatobiliary: No focal liver abnormality is seen. No gallstones, gallbladder wall thickening, or biliary dilatation. Pancreas: No ductal dilatation or inflammation. Spleen: Normal in size without focal abnormality. Adrenals/Urinary Tract: Normal adrenal glands. Left hydroureteronephrosis. Ureter is dilated to the level of the pelvis were there is ureteral thickening and enhancement, image 48 series 3. More distal ureter is decompressed. Hydronephrosis is progressed from 2015 CT. There is diminished excretion on delayed phase imaging from the left kidney. Nonobstructing stones in the left kidney, at least 4, largest measuring 9 mm. No right hydronephrosis. Right ureter is decompressed. Urinary bladder is partially distended, no definite bladder wall thickening. Stomach/Bowel: Mild colonic diverticulosis. No colonic wall thickening or diverticulitis. Small to moderate stool burden throughout the colon. Administered enteric contrast reaches the colon. There is colonic tortuosity. High-riding sigmoid colon in the right mid abdomen. Appendix not confidently visualized. Unremarkable small bowel without wall  thickening. Stomach is nondistended. Vascular/Lymphatic: Aortic atherosclerosis and tortuosity. No aneurysm. No enlarged lymph nodes in the abdomen and pelvis. Small retroperitoneal nodes at the level of the left kidney measures 6 mm. Reproductive: Unchanged left ovarian/adnexal cyst measuring 2.7 cm. Right ovary not visualized. Uterus surgically absent. Other: Small fat containing umbilical hernia. Small fat containing supraumbilical hernia No free air, free fluid, or intra-abdominal fluid collection. Musculoskeletal: Chronic compression fractures of T11, L1, and L3 with vertebroplasty. Mild L2 compression fracture. Bilateral sacroplasty. IMPRESSION: 1. Moderate left hydronephrosis and proximal hydroureter. There is ureteral thickening and enhancement in the region of the distal ureter. Differential considerations include stricture or transitional neoplasm, recommend direct visualization with cystoscopy. 2. Nonobstructing left nephrolithiasis. 3. Colonic diverticulosis without diverticulitis. 4. Fat containing umbilical hernia. Fat containing supraumbilical hernia. 5.  Aortic Atherosclerosis (ICD10-I70.0). Electronically Signed   By: Keith Rake M.D.   On: 06/20/2019 02:15   Vas [place] Lower Extremity Venous (dvt) (only [facility])  Result Date: 06/18/2019  Lower Venous Study Indications: Edema.  Comparison Study: Prior negative study done 05/04/19 is available for comparison Performing Technologist: Sharion Dove RVS  Examination Guidelines: A complete evaluation includes B-mode imaging, spectral Doppler, color Doppler, and power Doppler as needed of all accessible portions of each vessel. Bilateral testing is considered an integral part of a complete examination. Limited examinations for reoccurring indications may be performed as noted.  +-----+---------------+---------+-----------+----------+-------+ RIGHTCompressibilityPhasicitySpontaneityPropertiesSummary  +-----+---------------+---------+-----------+----------+-------+ CFV  Full           Yes      Yes                          +-----+---------------+---------+-----------+----------+-------+   +---------+---------------+---------+-----------+----------+-------+ LEFT     CompressibilityPhasicitySpontaneityPropertiesSummary +---------+---------------+---------+-----------+----------+-------+ CFV  Full           Yes      Yes                          +---------+---------------+---------+-----------+----------+-------+ SFJ      Full                                                 +---------+---------------+---------+-----------+----------+-------+ FV Prox  Full                                                 +---------+---------------+---------+-----------+----------+-------+ FV Mid   Full                                                 +---------+---------------+---------+-----------+----------+-------+ FV DistalFull                                                 +---------+---------------+---------+-----------+----------+-------+ PFV      Full                                                 +---------+---------------+---------+-----------+----------+-------+ POP      Full           Yes      No                           +---------+---------------+---------+-----------+----------+-------+ PTV      Full                                                 +---------+---------------+---------+-----------+----------+-------+ PERO     Full                                                 +---------+---------------+---------+-----------+----------+-------+     Summary: Right: No evidence of common femoral vein obstruction. There is no evidence of deep vein thrombosis in the lower extremity. Left: No evidence of deep vein thrombosis in the lower extremity. No indirect evidence of obstruction proximal to the inguinal ligament.  *See table(s) above for  measurements and observations. Electronically signed by Monica Martinez MD on 06/18/2019 at 3:12:25 PM.    Final     Medications: I have reviewed the patient's current medications.  Assessment: Severe, symptomatic, recurrent anemia without obvious melena or hematochezia Hb 10.1 today, FOBT positive CT abdomen with contrast showed a normal liver, mild colonic diverticulosis, colonic tortuosity, high riding sigmoid colon and right mid abdomen, unremarkable small bowel and nondistended  stomach Incidentally found to have moderate left hydronephrosis and proximal hydroureter, ureteral thickening and enhancement in distal ureter, possible stricture or transitional neoplasm and cystoscopy was recommended  Plan: Considering her age and comorbidities, the risk of performing colonoscopy outweighs the benefit, especially with an unremarkable CAT scan and no melena or hematochezia. Recent EGD from 05/24/2019 showed focal erosive esophagitis, patient is appropriately on PPI twice daily, without symptoms of dysphagia, odynophagia. Will advance diet to regular. Recommend outpatient periodic H&H monitoring with IV iron infusion or blood transfusion as needed. No plans for endoscopic intervention. We will sign off from GI standpoint. Patient will benefit from cystoscopy evaluation due to changes noted on CAT scan.    [name] 06/20/2019, 9:14 AM   Pager [phone] If no answer or after 5 PM call [phone]

## 2019-06-20 NOTE — Evaluation (Signed)
Physical Therapy Evaluation Patient Details Name: [name] MRN: [id number] DOB: Sep 28, 1936 Today's Date: 06/20/2019   History of Present Illness  83 year old female with recent CVA, COPD on home oxygen, chronic back pain admitted for recurrent severe and symptomatic anemia and FOBT positive stool  Clinical Impression  Pt very HOH and difficult to direct and communicate with pt. Pt reports she has hospital bed, assist all the time and is working on walking with therapist at home. Pt refused OOB to chair or attempting gait today with fear of falling and wanting more assist. Pt with decreased strength, balance, transfers and function who will benefit from acute therapy to maximize mobility, safety and independence to decrease burden of care. Pt very adament she is to return home and that family can provide physical assist as needed. Encouraged OOB but pt stating she will not sit in the chair in the hospital.   Pt on 2L throughout with SpO2 94%     Follow Up Recommendations Home health PT;Supervision/Assistance - 24 hour(pt reports she is adament for return home with family assist 24hr)    Equipment Recommendations  None recommended by PT    Recommendations for Other Services OT consult     Precautions / Restrictions Precautions Precautions: Fall      Mobility  Bed Mobility Overal bed mobility: Needs Assistance Bed Mobility: Supine to Sit;Sit to Supine     Supine to sit: Mod assist Sit to supine: Mod assist   General bed mobility comments: mod assist to pivot to EOB with HOB 20 degrees and use of rail with pad to pivot to EOB. Return to bed with mod assist for lower body onto surface  Transfers Overall transfer level: Needs assistance   Transfers: Sit to/from Stand Sit to Stand: Mod assist         General transfer comment: mod assist to stand x 2 trials with left knee blocked from bed with pt able to side step toward [facility] grossly 3' with RW, min assist and cues. Pt  refused transfer OOB to chair  Ambulation/Gait             General Gait Details: pt refused attempting to walk due to fear of falling despite reassurance, belt, non slip socks and chair present to follow  Stairs            Wheelchair Mobility    Modified Rankin (Stroke Patients Only)       Balance Overall balance assessment: Needs assistance;History of Falls   Sitting balance-Leahy Scale: Fair Sitting balance - Comments: pt able to sit EOB with feet supported with minguard     Standing balance-Leahy Scale: Poor Standing balance comment: reliant on bil UE support and assist                             Pertinent Vitals/Pain Pain Assessment: 0-10 Pain Score: 4  Pain Location: back and legs Pain Descriptors / Indicators: Aching;Constant Pain Intervention(s): Limited activity within patient's tolerance;Repositioned;Monitored during session;Premedicated before session    Home Living Family/patient expects to be discharged to:: Private residence Living Arrangements: Children Available Help at Discharge: Family;Available 24 hours/day Type of Home: House Home Access: Stairs to enter Entrance Stairs-Rails: Right Entrance Stairs-Number of Steps: 2 Home Layout: One level Home Equipment: Walker - 2 wheels;Cane - single point;Bedside commode;Walker - 4 wheels;Wheelchair - manual;Hospital bed      Prior Function Level of Independence: Needs assistance   Gait /  Transfers Assistance Needed: pt walking short distances with walker with therapist at home, assist for bed mobility  ADL's / Homemaking Assistance Needed: family and therapy have been assiting with bathing and dressing        Hand Dominance        Extremity/Trunk Assessment   Upper Extremity Assessment Upper Extremity Assessment: Generalized weakness    Lower Extremity Assessment Lower Extremity Assessment: Generalized weakness    Cervical / Trunk Assessment Cervical / Trunk  Assessment: Kyphotic  Communication   Communication: HOH  Cognition Arousal/Alertness: Awake/alert Behavior During Therapy: Flat affect Overall Cognitive Status: Difficult to assess                                 General Comments: pt very HOH and clearly fearful of falling. Pt tangential and talking about her rehab stay and prior falls but difficult to redirect and cue due to very [place]. Pt with O2 off on arrival stating the cannula is broken as pt fiddling with IV tubing. Replaced [place]      General Comments      Exercises     Assessment/Plan    PT Assessment Patient needs continued PT services  PT Problem List Decreased strength;Decreased mobility;Decreased safety awareness;Decreased activity tolerance;Decreased coordination;Decreased balance;Decreased knowledge of use of DME;Cardiopulmonary status limiting activity       PT Treatment Interventions Gait training;Therapeutic exercise;Patient/family education;Balance training;Functional mobility training;DME instruction;Therapeutic activities;Cognitive remediation    PT Goals (Current goals can be found in the Care Plan section)  Acute Rehab PT Goals Patient Stated Goal: return home and walk PT Goal Formulation: With patient Time For Goal Achievement: 07/04/19 Potential to Achieve Goals: Fair    Frequency Min 3X/week   Barriers to discharge        Co-evaluation               AM-PAC PT "6 Clicks" Mobility  Outcome Measure Help needed turning from your back to your side while in a flat bed without using bedrails?: A Lot Help needed moving from lying on your back to sitting on the side of a flat bed without using bedrails?: A Lot Help needed moving to and from a bed to a chair (including a wheelchair)?: A Lot Help needed standing up from a chair using your arms (e.g., wheelchair or bedside chair)?: A Lot Help needed to walk in hospital room?: A Lot Help needed climbing 3-5 steps with a railing? : Total 6  Click Score: 11    End of Session Equipment Utilized During Treatment: Gait belt;Oxygen Activity Tolerance: Patient tolerated treatment well Patient left: in bed;with call bell/phone within reach;with bed alarm set Nurse Communication: Mobility status;Precautions PT Visit Diagnosis: Other abnormalities of gait and mobility (R26.89);Muscle weakness (generalized) (M62.81);Unsteadiness on feet (R26.81);History of falling (Z91.81)    Time: 3016-0109 PT Time Calculation (min) (ACUTE ONLY): 32 min   Charges:   PT Evaluation $PT Eval Moderate Complexity: 1 Mod PT Treatments $Therapeutic Activity: 8-22 mins        Rielle Schlauch Pam Drown, PT Acute Rehabilitation Services Pager: [phone] Office: [phone]   Palin Tristan B Bow Buntyn 06/20/2019, 12:22 PM

## 2019-06-20 NOTE — TOC Transition Note (Addendum)
Transition of Care [facility]) - CM/SW Discharge Note Marvetta Gibbons RN,BSN Transitions of Care Cross Coverage 3E - RN Case Manager [phone]   Patient Details  Name: [name] MRN: [id number] Date of Birth: 06/27/1936  Transition of Care [facility]) CM/SW Contact:  Dawayne Patricia, RN Phone Number: 06/20/2019, 1:37 PM   Clinical Narrative:    Pt stable for transition home, orders placed for HHPT/OT. CM spoke with pt at bedside confirmed pt wants to return home with [facility] services- pt reports that she is already active with [facility] for [facility] therapies. Pt states she wants to continue with [facility] for [facility]. Pt also reports that she has all needed DME including home 02 with [employer] [facility]  and that he son is at home to provide needed assistance. Pt states she may need EMS transport home wants this CM to check with her son regarding transport home. TC made to son [name]- per son he does not have transportation to come get patient, pt will need to go home via non-emergent EMS. Pt agreeable to this and states she has gone home via EMS before. CM will arrange PTAR for transport - spoke with bedside RN-Trixie- pt will be ready at 3:30pm- call made to [facility] for 330 pickup. Paperwork placed on shadow chart. Call made to Coyle with [facility] for resumption of HHPT/OT services.    Final next level of care: [place] Barriers to Discharge: No Barriers Identified   Patient Goals and CMS Choice Patient states their goals for this hospitalization and ongoing recovery are:: "to get back home" CMS Medicare.gov Compare Post Acute Care list provided to:: Patient Choice offered to / list presented to : Patient  Discharge Placement                       Discharge Plan and Services   Discharge Planning Services: CM Consult Post Acute Care Choice: Home Health, Resumption of Svcs/PTA Provider          DME Arranged: N/A DME Agency: NA       HH Arranged: PT, OT HH Agency: [facility] ([facility] [employer]) Date [place]: 06/20/19 Time [place]: 1300 Representative spoke with at [place]: [place] Interventions     Readmission Risk Interventions Readmission Risk Prevention Plan 06/20/2019 05/01/2019 08/22/2018  Post Dischage Appt - - Patient refused  Medication Screening - - Complete  Transportation Screening Complete Complete Complete  PCP follow-up - - Patient refused  PCP or Specialist Appt within 5-7 Days Complete - -  Home Care Screening Complete - -  Medication Review (RN CM) Complete - -  Medication Review ([place]) - Referral to Pharmacy -  Some recent data might be hidden

## 2019-06-22 LAB — NOVEL CORONAVIRUS, NAA (HOSP ORDER, SEND-OUT TO REF LAB; TAT 18-24 HRS): SARS-CoV-2, NAA: NOT DETECTED

## 2019-06-23 ENCOUNTER — Telehealth: Payer: Self-pay | Admitting: *Deleted

## 2019-06-23 NOTE — Telephone Encounter (Signed)
[name] PT for [facility] called for resumption of PT orders. [name] had a brief rehospitalization after discharge from inpt rehabe but since she was seen for her TC visit with Zella Ball 06/01/19 I approved the resumption of care.

## 2019-06-29 ENCOUNTER — Inpatient Hospital Stay: Payer: Self-pay | Admitting: Adult Health

## 2019-07-10 ENCOUNTER — Encounter: Payer: Medicare Other | Attending: Physical Medicine & Rehabilitation | Admitting: Physical Medicine & Rehabilitation

## 2019-07-14 ENCOUNTER — Encounter: Payer: Medicare Other | Admitting: Vascular Surgery

## 2019-07-18 ENCOUNTER — Telehealth: Payer: Self-pay | Admitting: Physical Medicine & Rehabilitation

## 2019-07-18 NOTE — Telephone Encounter (Signed)
OK - thanks

## 2019-07-18 NOTE — Telephone Encounter (Signed)
I left a message with [name] at [place] ( she was in a meeting) and explained that we discharged her from inpt rehab and subsequently saw her for TC visit 06/08/19 so I ok'd the resumption of the PT orders. But her discharge from the hospital on 06/20/19 she was to follow up with PCP and have labs drawn per Jacki Cones MD (hospitalist)  Recommendations for Outpatient Follow-up:  1. Follow up with PCP in 1-2 weeks 2. Please obtain BMP/CBC in one week 3. Please follow-up with GI Dr. Therisa Doyne 4. Follow-up with urology Dr. Louis Meckel  Home Health none Equipment/Devices none Discharge Condition: Stable and improved  Per Dr Letta Pate the lab order needs to come from primary care.

## 2019-07-18 NOTE — Telephone Encounter (Signed)
Call tferred to my voicemail from clinic - [name] with [facility] states Dr. Rema Fendt?) is will to take over [name]'s care - but if Dr. Letta Pate would like to - they need an order issued for labs to be drawn-- please advise [facility] number is [phone]

## 2019-07-18 NOTE — Telephone Encounter (Signed)
[name] called back and we will just turn over all orders to her PCP Dr Ward Chatters who will add a nurse and get the labs. All further HH orders are to be handled by PCP.

## 2019-12-16 IMAGING — CT CT ABDOMEN AND PELVIS WITH CONTRAST
2 of 5 series · 16 of 46 positions shown, 18 images · IV contrast (APPLIED)
Comparison: CT 03/19/2014

CLINICAL DATA: Anemia chronic disease.

EXAM:
CT ABDOMEN AND PELVIS WITH CONTRAST
TECHNIQUE: Multidetector CT imaging of the abdomen and pelvis was performed
using the standard protocol following bolus administration of
intravenous contrast.
CONTRAST:  100mL OMNIPAQUE IOHEXOL 300 MG/ML  SOLN

[Series 3: abdomen 5.0 · axial · 0.81mm/px · z∈[+837,+1222]mm · 13 of 89 slices shown, 15 images]
[im 6/89  soft-tissue]
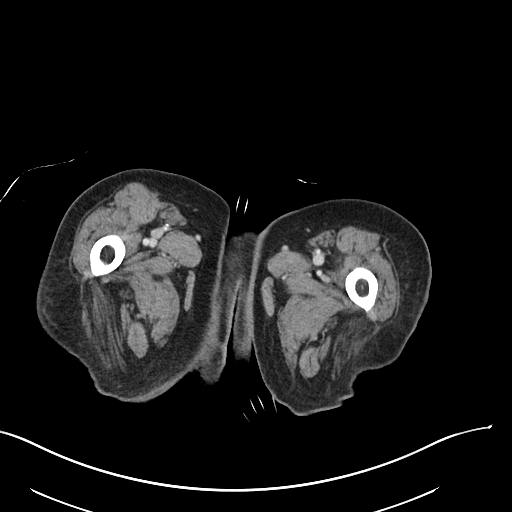
[im 6/89  bone]
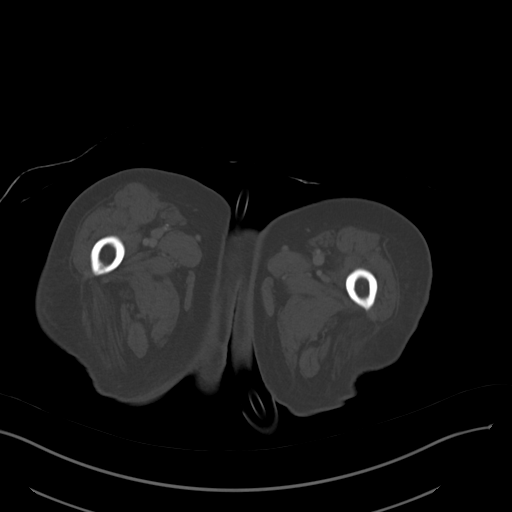
[im 12/89  soft-tissue]
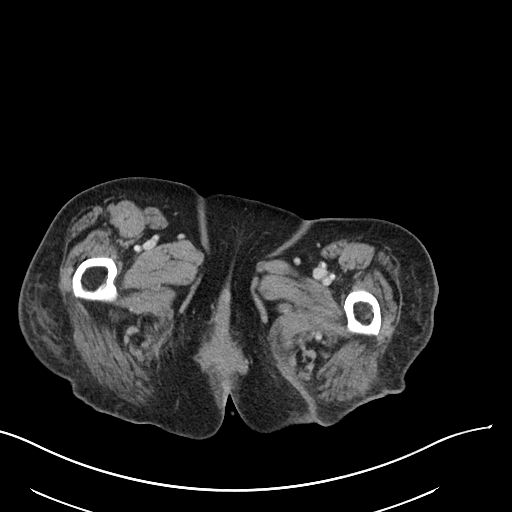
[im 17/89  soft-tissue]
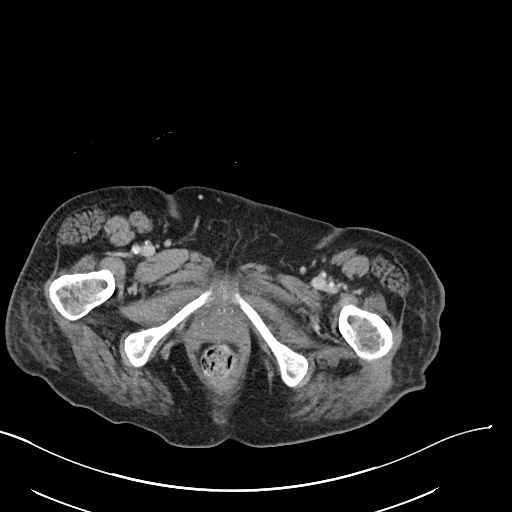
[im 28/89  soft-tissue]
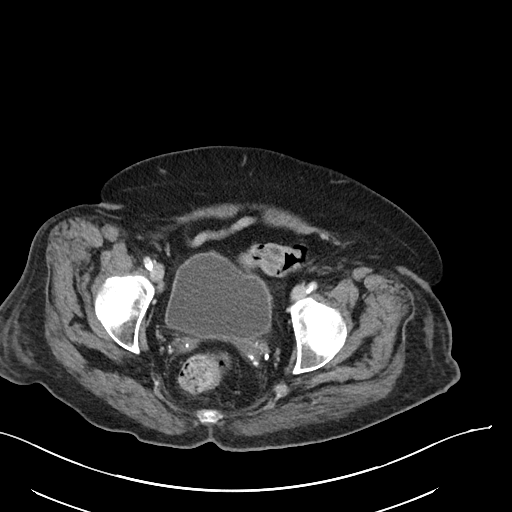
[im 34/89  soft-tissue]
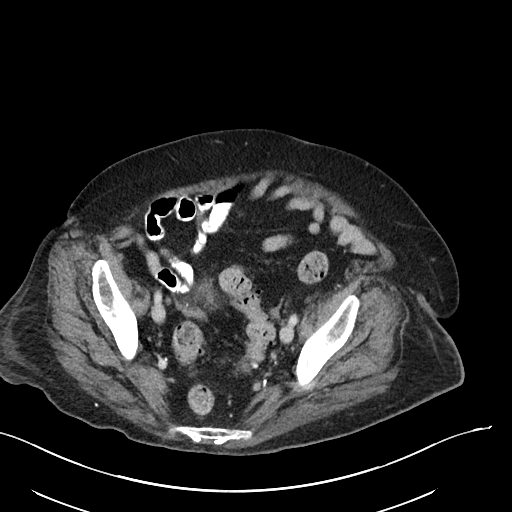
[im 39/89  soft-tissue]
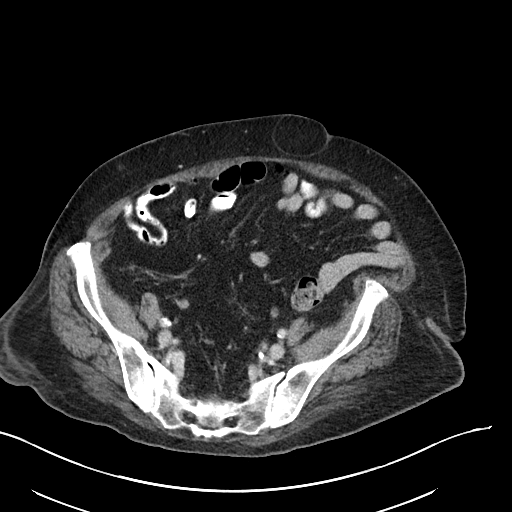
[im 45/89  soft-tissue]
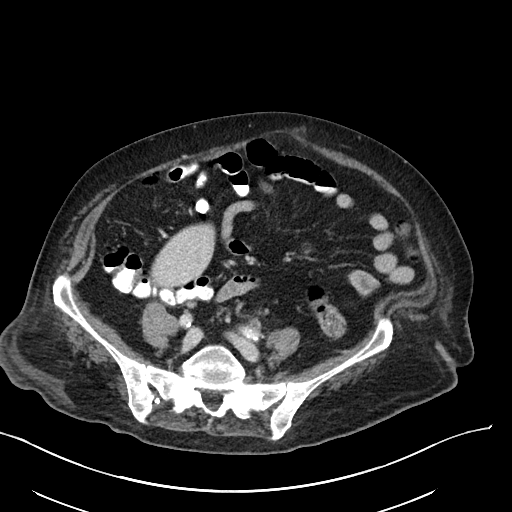
[im 50/89  soft-tissue]
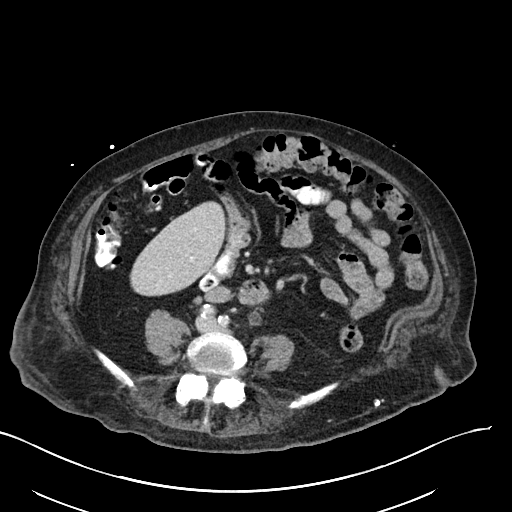
[im 56/89  soft-tissue]
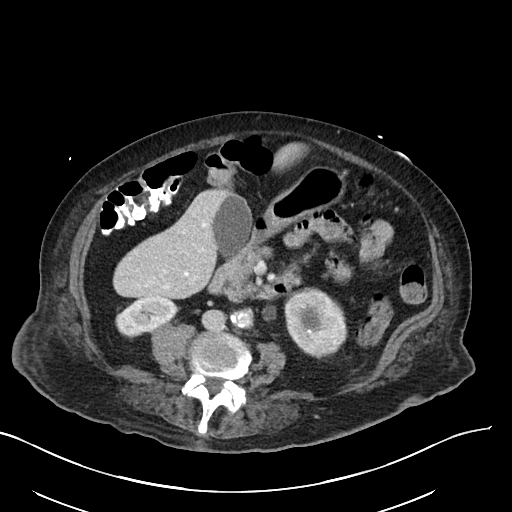
[im 56/89  bone]
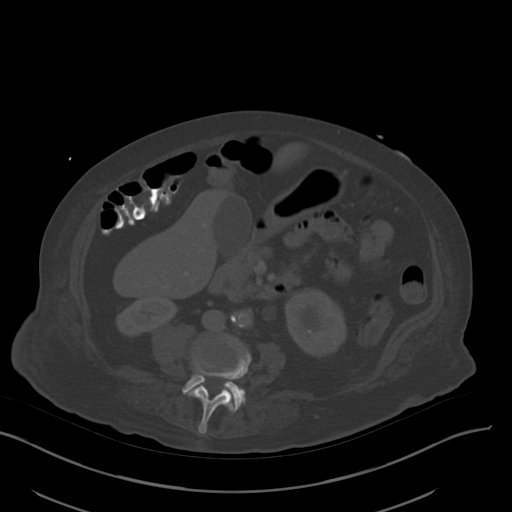
[im 61/89  soft-tissue]
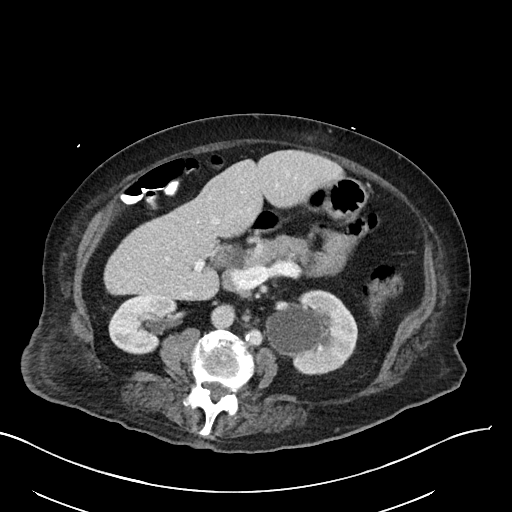
[im 72/89  soft-tissue]
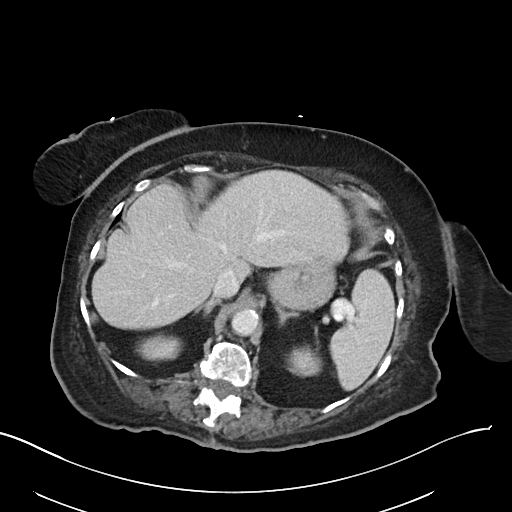
[im 78/89  soft-tissue]
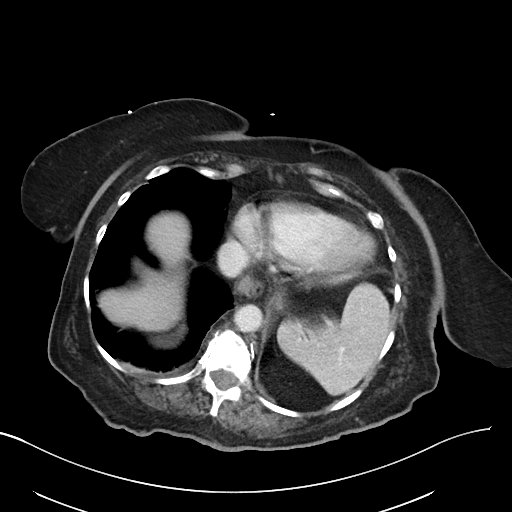
[im 83/89  soft-tissue]
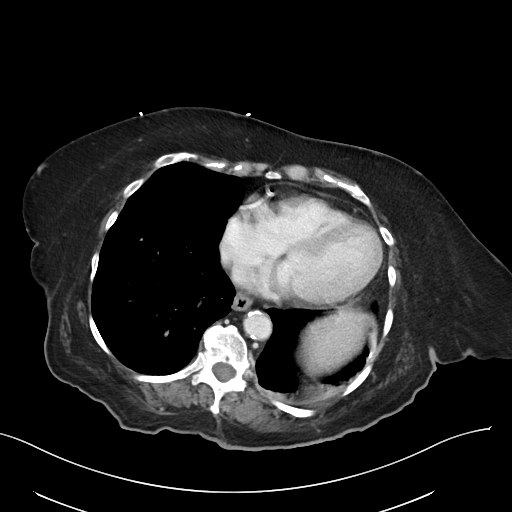

[Series 6: abdomen 3.0 mpr cor · coronal · 0.86mm/px · 3 of 99 slices shown]
[im 33/99  soft-tissue]
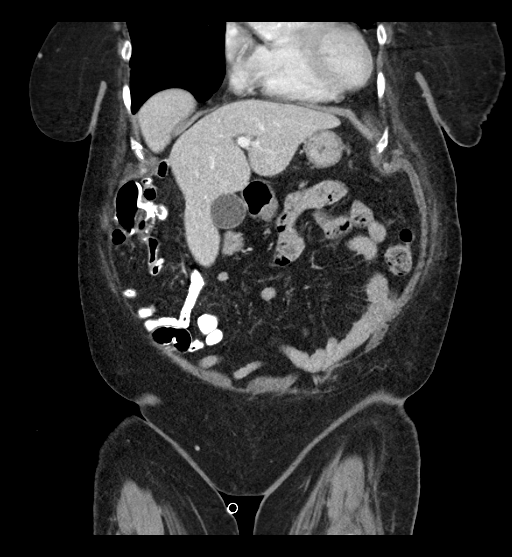
[im 44/99  soft-tissue]
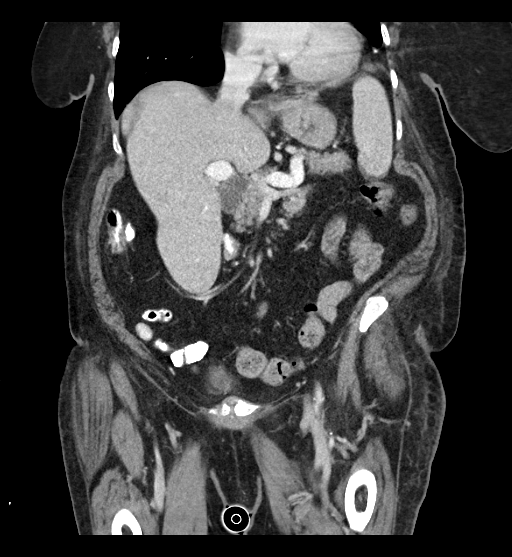
[im 55/99  soft-tissue]
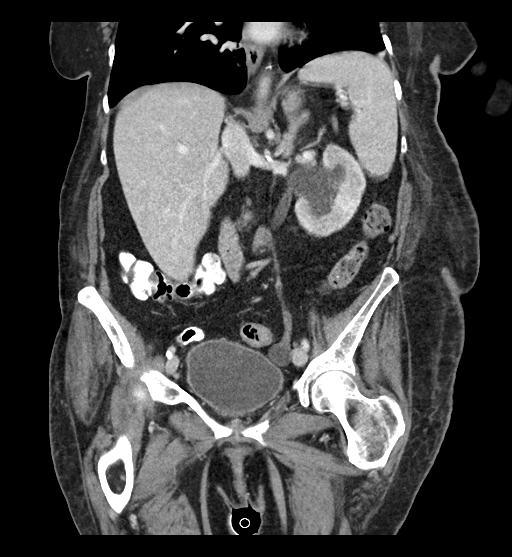

[16 of 46 positions shown; findings below may reference images not displayed]

FINDINGS: Lower chest: Left lower lobe atelectasis. No pleural fluid. Mild
elevation of left hemidiaphragm.

Hepatobiliary: No focal liver abnormality is seen. No gallstones,
gallbladder wall thickening, or biliary dilatation.

Pancreas: No ductal dilatation or inflammation.

Spleen: Normal in size without focal abnormality.

Adrenals/Urinary Tract: Normal adrenal glands. Left
hydroureteronephrosis. Ureter is dilated to the level of the pelvis
were there is ureteral thickening and enhancement, image 48 series
3. More distal ureter is decompressed. Hydronephrosis is progressed
from 7499 CT. There is diminished excretion on delayed phase imaging
from the left kidney. Nonobstructing stones in the left kidney, at
least 4, largest measuring 9 mm. No right hydronephrosis. Right
ureter is decompressed.

Urinary bladder is partially distended, no definite bladder wall
thickening.

Stomach/Bowel: Mild colonic diverticulosis. No colonic wall
thickening or diverticulitis. Small to moderate stool burden
throughout the colon. Administered enteric contrast reaches the
colon. There is colonic tortuosity. High-riding sigmoid colon in the
right mid abdomen. Appendix not confidently visualized. Unremarkable
small bowel without wall thickening. Stomach is nondistended.

Vascular/Lymphatic: Aortic atherosclerosis and tortuosity. No
aneurysm. No enlarged lymph nodes in the abdomen and pelvis. Small
retroperitoneal nodes at the level of the left kidney measures 6 mm.

Reproductive: Unchanged left ovarian/adnexal cyst measuring 2.7 cm.
Right ovary not visualized. Uterus surgically absent.

Other: Small fat containing umbilical hernia. Small fat containing
supraumbilical hernia No free air, free fluid, or intra-abdominal
fluid collection.

Musculoskeletal: Chronic compression fractures of T11, L1, and L3
with vertebroplasty. Mild L2 compression fracture. Bilateral
sacroplasty.
IMPRESSION: 1. Moderate left hydronephrosis and proximal hydroureter. There is
ureteral thickening and enhancement in the region of the distal
ureter. Differential considerations include stricture or
transitional neoplasm, recommend direct visualization with
cystoscopy.
2. Nonobstructing left nephrolithiasis.
3. Colonic diverticulosis without diverticulitis.
4. Fat containing umbilical hernia. Fat containing supraumbilical
hernia.
5.  Aortic Atherosclerosis ([id number]).

## 2020-03-16 ENCOUNTER — Inpatient Hospital Stay (HOSPITAL_COMMUNITY): Payer: Medicare Other

## 2020-03-16 ENCOUNTER — Other Ambulatory Visit: Payer: Self-pay

## 2020-03-16 ENCOUNTER — Encounter (HOSPITAL_COMMUNITY): Payer: Self-pay | Admitting: Emergency Medicine

## 2020-03-16 ENCOUNTER — Inpatient Hospital Stay (HOSPITAL_COMMUNITY)
Admission: EM | Admit: 2020-03-16 | Discharge: 2020-03-20 | DRG: 378 | Disposition: A | Discharge status: Home / Self Care | Payer: Medicare Other | Attending: Student in an Organized Health Care Education/Training Program | Admitting: Student in an Organized Health Care Education/Training Program

## 2020-03-16 DIAGNOSIS — Z20822 Contact with and (suspected) exposure to covid-19: Secondary | ICD-10-CM | POA: Diagnosis present

## 2020-03-16 DIAGNOSIS — D649 Anemia, unspecified: Secondary | ICD-10-CM

## 2020-03-16 DIAGNOSIS — Z885 Allergy status to narcotic agent status: Secondary | ICD-10-CM | POA: Diagnosis not present

## 2020-03-16 DIAGNOSIS — I129 Hypertensive chronic kidney disease with stage 1 through stage 4 chronic kidney disease, or unspecified chronic kidney disease: Secondary | ICD-10-CM | POA: Diagnosis present

## 2020-03-16 DIAGNOSIS — K5731 Diverticulosis of large intestine without perforation or abscess with bleeding: Secondary | ICD-10-CM | POA: Diagnosis present

## 2020-03-16 DIAGNOSIS — I69354 Hemiplegia and hemiparesis following cerebral infarction affecting left non-dominant side: Secondary | ICD-10-CM

## 2020-03-16 DIAGNOSIS — N183 Chronic kidney disease, stage 3 unspecified: Secondary | ICD-10-CM | POA: Diagnosis present

## 2020-03-16 DIAGNOSIS — N200 Calculus of kidney: Secondary | ICD-10-CM | POA: Diagnosis present

## 2020-03-16 DIAGNOSIS — K219 Gastro-esophageal reflux disease without esophagitis: Secondary | ICD-10-CM | POA: Diagnosis present

## 2020-03-16 DIAGNOSIS — Z87891 Personal history of nicotine dependence: Secondary | ICD-10-CM

## 2020-03-16 DIAGNOSIS — F329 Major depressive disorder, single episode, unspecified: Secondary | ICD-10-CM | POA: Diagnosis present

## 2020-03-16 DIAGNOSIS — Z9981 Dependence on supplemental oxygen: Secondary | ICD-10-CM | POA: Diagnosis not present

## 2020-03-16 DIAGNOSIS — G8929 Other chronic pain: Secondary | ICD-10-CM | POA: Diagnosis present

## 2020-03-16 DIAGNOSIS — J9610 Chronic respiratory failure, unspecified whether with hypoxia or hypercapnia: Secondary | ICD-10-CM | POA: Diagnosis present

## 2020-03-16 DIAGNOSIS — Z833 Family history of diabetes mellitus: Secondary | ICD-10-CM

## 2020-03-16 DIAGNOSIS — K5641 Fecal impaction: Secondary | ICD-10-CM

## 2020-03-16 DIAGNOSIS — M810 Age-related osteoporosis without current pathological fracture: Secondary | ICD-10-CM | POA: Diagnosis not present

## 2020-03-16 DIAGNOSIS — D696 Thrombocytopenia, unspecified: Secondary | ICD-10-CM | POA: Diagnosis present

## 2020-03-16 DIAGNOSIS — Z6826 Body mass index (BMI) 26.0-26.9, adult: Secondary | ICD-10-CM

## 2020-03-16 DIAGNOSIS — L8990 Pressure ulcer of unspecified site, unspecified stage: Secondary | ICD-10-CM | POA: Insufficient documentation

## 2020-03-16 DIAGNOSIS — K5710 Diverticulosis of small intestine without perforation or abscess without bleeding: Secondary | ICD-10-CM | POA: Diagnosis not present

## 2020-03-16 DIAGNOSIS — M549 Dorsalgia, unspecified: Secondary | ICD-10-CM | POA: Diagnosis present

## 2020-03-16 DIAGNOSIS — L89151 Pressure ulcer of sacral region, stage 1: Secondary | ICD-10-CM | POA: Diagnosis present

## 2020-03-16 DIAGNOSIS — Z79891 Long term (current) use of opiate analgesic: Secondary | ICD-10-CM

## 2020-03-16 DIAGNOSIS — K648 Other hemorrhoids: Secondary | ICD-10-CM | POA: Diagnosis not present

## 2020-03-16 DIAGNOSIS — K635 Polyp of colon: Secondary | ICD-10-CM | POA: Diagnosis present

## 2020-03-16 DIAGNOSIS — D62 Acute posthemorrhagic anemia: Secondary | ICD-10-CM | POA: Diagnosis present

## 2020-03-16 DIAGNOSIS — Z888 Allergy status to other drugs, medicaments and biological substances status: Secondary | ICD-10-CM | POA: Diagnosis not present

## 2020-03-16 DIAGNOSIS — D473 Essential (hemorrhagic) thrombocythemia: Secondary | ICD-10-CM

## 2020-03-16 DIAGNOSIS — K922 Gastrointestinal hemorrhage, unspecified: Secondary | ICD-10-CM

## 2020-03-16 DIAGNOSIS — Z8249 Family history of ischemic heart disease and other diseases of the circulatory system: Secondary | ICD-10-CM

## 2020-03-16 DIAGNOSIS — R64 Cachexia: Secondary | ICD-10-CM | POA: Diagnosis present

## 2020-03-16 DIAGNOSIS — Z66 Do not resuscitate: Secondary | ICD-10-CM | POA: Diagnosis present

## 2020-03-16 DIAGNOSIS — Z7982 Long term (current) use of aspirin: Secondary | ICD-10-CM

## 2020-03-16 DIAGNOSIS — K429 Umbilical hernia without obstruction or gangrene: Secondary | ICD-10-CM | POA: Diagnosis not present

## 2020-03-16 DIAGNOSIS — K640 First degree hemorrhoids: Secondary | ICD-10-CM | POA: Diagnosis present

## 2020-03-16 DIAGNOSIS — D509 Iron deficiency anemia, unspecified: Secondary | ICD-10-CM | POA: Diagnosis present

## 2020-03-16 DIAGNOSIS — J449 Chronic obstructive pulmonary disease, unspecified: Secondary | ICD-10-CM | POA: Diagnosis present

## 2020-03-16 DIAGNOSIS — K5289 Other specified noninfective gastroenteritis and colitis: Secondary | ICD-10-CM | POA: Diagnosis present

## 2020-03-16 DIAGNOSIS — K921 Melena: Secondary | ICD-10-CM | POA: Diagnosis not present

## 2020-03-16 DIAGNOSIS — I10 Essential (primary) hypertension: Secondary | ICD-10-CM | POA: Diagnosis present

## 2020-03-16 DIAGNOSIS — R7989 Other specified abnormal findings of blood chemistry: Secondary | ICD-10-CM | POA: Diagnosis present

## 2020-03-16 DIAGNOSIS — Z79899 Other long term (current) drug therapy: Secondary | ICD-10-CM

## 2020-03-16 DIAGNOSIS — D126 Benign neoplasm of colon, unspecified: Secondary | ICD-10-CM | POA: Diagnosis not present

## 2020-03-16 DIAGNOSIS — D75839 Thrombocytosis, unspecified: Secondary | ICD-10-CM

## 2020-03-16 DIAGNOSIS — M1990 Unspecified osteoarthritis, unspecified site: Secondary | ICD-10-CM | POA: Diagnosis present

## 2020-03-16 DIAGNOSIS — Z90710 Acquired absence of both cervix and uterus: Secondary | ICD-10-CM

## 2020-03-16 DIAGNOSIS — Z9889 Other specified postprocedural states: Secondary | ICD-10-CM | POA: Diagnosis not present

## 2020-03-16 LAB — CBC WITH DIFFERENTIAL/PLATELET
Abs Immature Granulocytes: 0.1 10*3/uL — ABNORMAL HIGH (ref 0.00–0.07)
Abs Immature Granulocytes: 0.21 10*3/uL — ABNORMAL HIGH (ref 0.00–0.07)
Basophils Absolute: 0.1 10*3/uL (ref 0.0–0.1)
Basophils Absolute: 0.1 10*3/uL (ref 0.0–0.1)
Basophils Relative: 0 %
Basophils Relative: 0 %
Eosinophils Absolute: 0.2 10*3/uL (ref 0.0–0.5)
Eosinophils Absolute: 0.2 10*3/uL (ref 0.0–0.5)
Eosinophils Relative: 1 %
Eosinophils Relative: 1 %
HCT: 16.9 % — ABNORMAL LOW (ref 36.0–46.0)
HCT: 26.6 % — ABNORMAL LOW (ref 36.0–46.0)
Hemoglobin: 4.2 g/dL — CL (ref 12.0–15.0)
Hemoglobin: 7.9 g/dL — ABNORMAL LOW (ref 12.0–15.0)
Immature Granulocytes: 1 %
Immature Granulocytes: 2 %
Lymphocytes Relative: 7 %
Lymphocytes Relative: 12 %
Lymphs Abs: 0.9 10*3/uL (ref 0.7–4.0)
Lymphs Abs: 1.3 10*3/uL (ref 0.7–4.0)
MCH: 18.8 pg — ABNORMAL LOW (ref 26.0–34.0)
MCH: 23.7 pg — ABNORMAL LOW (ref 26.0–34.0)
MCHC: 24.9 g/dL — ABNORMAL LOW (ref 30.0–36.0)
MCHC: 29.7 g/dL — ABNORMAL LOW (ref 30.0–36.0)
MCV: 75.4 fL — ABNORMAL LOW (ref 80.0–100.0)
MCV: 79.9 fL — ABNORMAL LOW (ref 80.0–100.0)
Monocytes Absolute: 0.6 10*3/uL (ref 0.1–1.0)
Monocytes Absolute: 0.8 10*3/uL (ref 0.1–1.0)
Monocytes Relative: 4 %
Monocytes Relative: 7 %
Neutro Abs: 11.6 10*3/uL — ABNORMAL HIGH (ref 1.7–7.7)
Neutro Abs: 8.8 10*3/uL — ABNORMAL HIGH (ref 1.7–7.7)
Neutrophils Relative %: 87 %
Neutrophils Relative %: 78 %
Platelets: 1006 10*3/uL (ref 150–400)
Platelets: 772 10*3/uL — ABNORMAL HIGH (ref 150–400)
RBC: 2.24 MIL/uL — ABNORMAL LOW (ref 3.87–5.11)
RBC: 3.33 MIL/uL — ABNORMAL LOW (ref 3.87–5.11)
RDW: 19.4 % — ABNORMAL HIGH (ref 11.5–15.5)
RDW: 21.6 % — ABNORMAL HIGH (ref 11.5–15.5)
WBC: 13.4 10*3/uL — ABNORMAL HIGH (ref 4.0–10.5)
WBC: 11.3 10*3/uL — ABNORMAL HIGH (ref 4.0–10.5)
nRBC: 0.9 % — ABNORMAL HIGH (ref 0.0–0.2)
nRBC: 1 % — ABNORMAL HIGH (ref 0.0–0.2)

## 2020-03-16 LAB — COMPREHENSIVE METABOLIC PANEL
ALT: 20 U/L (ref 0–44)
AST: 16 U/L (ref 15–41)
Albumin: 2.7 g/dL — ABNORMAL LOW (ref 3.5–5.0)
Alkaline Phosphatase: 83 U/L (ref 38–126)
Anion gap: 13 (ref 5–15)
BUN: 26 mg/dL — ABNORMAL HIGH (ref 8–23)
CO2: 24 mmol/L (ref 22–32)
Calcium: 8.4 mg/dL — ABNORMAL LOW (ref 8.9–10.3)
Chloride: 104 mmol/L (ref 98–111)
Creatinine, Ser: 1.01 mg/dL — ABNORMAL HIGH (ref 0.44–1.00)
GFR calc Af Amer: >60 mL/min (ref >60)
GFR calc non Af Amer: 52 mL/min — ABNORMAL LOW (ref >60)
Glucose, Bld: 243 mg/dL — ABNORMAL HIGH (ref 70–99)
Potassium: 3.5 mmol/L (ref 3.5–5.1)
Sodium: 141 mmol/L (ref 135–145)
Total Bilirubin: 0.6 mg/dL (ref 0.3–1.2)
Total Protein: 6.4 g/dL — ABNORMAL LOW (ref 6.5–8.1)

## 2020-03-16 LAB — IRON AND TIBC
Iron: 5 ug/dL — ABNORMAL LOW (ref 28–170)
Saturation Ratios: 1 % — ABNORMAL LOW (ref 10.4–31.8)
TIBC: 400 ug/dL (ref 250–450)
UIBC: 395 ug/dL

## 2020-03-16 LAB — RETICULOCYTES
Immature Retic Fract: 16.5 % — ABNORMAL HIGH (ref 2.3–15.9)
RBC.: 2.2 MIL/uL — ABNORMAL LOW (ref 3.87–5.11)
Retic Count, Absolute: 43.6 10*3/uL (ref 19.0–186.0)
Retic Ct Pct: 2 % (ref 0.4–3.1)

## 2020-03-16 LAB — LACTIC ACID, PLASMA: Lactic Acid, Venous: 1.2 mmol/L (ref 0.5–1.9)

## 2020-03-16 LAB — PROTIME-INR
INR: 1 (ref 0.8–1.2)
Prothrombin Time: 13.4 seconds (ref 11.4–15.2)

## 2020-03-16 LAB — FERRITIN: Ferritin: 56 ng/mL (ref 11–307)

## 2020-03-16 LAB — PREPARE RBC (CROSSMATCH)

## 2020-03-16 LAB — POC OCCULT BLOOD, ED: Fecal Occult Bld: POSITIVE — AB

## 2020-03-16 LAB — GLUCOSE, CAPILLARY: Glucose-Capillary: 87 mg/dL (ref 70–99)

## 2020-03-16 LAB — SAVE SMEAR(SSMR), FOR PROVIDER SLIDE REVIEW

## 2020-03-16 LAB — SARS CORONAVIRUS 2 (TAT 6-24 HRS): SARS Coronavirus 2: NEGATIVE

## 2020-03-16 MED ORDER — AMLODIPINE BESYLATE 5 MG PO TABS
5.0000 mg | ORAL_TABLET | Freq: Every day | ORAL | Status: DC
  Administered 2020-03-16 – 2020-03-19 (×4): 5 mg via ORAL
  Filled 2020-03-16 (×5): qty 1

## 2020-03-16 MED ORDER — TRAMADOL HCL 50 MG PO TABS
50.0000 mg | ORAL_TABLET | Freq: Three times a day (TID) | ORAL | Status: DC | PRN
  Administered 2020-03-16 – 2020-03-18 (×3): 50 mg via ORAL
  Filled 2020-03-16 (×3): qty 1

## 2020-03-16 MED ORDER — IOHEXOL 9 MG/ML PO SOLN
ORAL | Status: AC
  Administered 2020-03-16: 19:00:00 500 mL
  Filled 2020-03-16: qty 500

## 2020-03-16 MED ORDER — GLYCERIN (LAXATIVE) 2.1 G RE SUPP
1.0000 | Freq: Every day | RECTAL | Status: DC
  Filled 2020-03-16 (×2): qty 1

## 2020-03-16 MED ORDER — IOHEXOL 300 MG/ML  SOLN
100.0000 mL | Freq: Once | INTRAMUSCULAR | Status: AC | PRN
  Administered 2020-03-16: 21:00:00 100 mL via INTRAVENOUS

## 2020-03-16 MED ORDER — POLYETHYLENE GLYCOL 3350 17 G PO PACK
17.0000 g | PACK | Freq: Three times a day (TID) | ORAL | Status: DC
  Administered 2020-03-16 – 2020-03-19 (×9): 17 g via ORAL
  Filled 2020-03-16 (×9): qty 1

## 2020-03-16 MED ORDER — LACTATED RINGERS IV BOLUS: 1000.0000 mL | Freq: Once | INTRAVENOUS | Status: DC

## 2020-03-16 MED ORDER — RAMELTEON 8 MG PO TABS
8.0000 mg | ORAL_TABLET | Freq: Every day | ORAL | Status: DC
  Administered 2020-03-16 – 2020-03-19 (×4): 8 mg via ORAL
  Filled 2020-03-16 (×5): qty 1

## 2020-03-16 MED ORDER — ONDANSETRON HCL 4 MG/2ML IJ SOLN
INTRAMUSCULAR | Status: AC
  Administered 2020-03-16: 13:00:00 4 mg via INTRAVENOUS
  Filled 2020-03-16: qty 2

## 2020-03-16 MED ORDER — SENNOSIDES-DOCUSATE SODIUM 8.6-50 MG PO TABS
2.0000 | ORAL_TABLET | Freq: Two times a day (BID) | ORAL | Status: DC
  Administered 2020-03-16 – 2020-03-18 (×4): 2 via ORAL
  Filled 2020-03-16 (×4): qty 2

## 2020-03-16 MED ORDER — GLYCERIN (LAXATIVE) 2.1 G RE SUPP
1.0000 | Freq: Once | RECTAL | Status: AC
  Administered 2020-03-16: 12:00:00 1 via RECTAL
  Filled 2020-03-16: qty 1

## 2020-03-16 MED ORDER — ATORVASTATIN CALCIUM 10 MG PO TABS
20.0000 mg | ORAL_TABLET | Freq: Every day | ORAL | Status: DC
  Administered 2020-03-16 – 2020-03-20 (×5): 20 mg via ORAL
  Filled 2020-03-16 (×5): qty 2

## 2020-03-16 MED ORDER — SODIUM CHLORIDE 0.9 % IV SOLN: 10.0000 mL/h | Freq: Once | INTRAVENOUS | Status: DC

## 2020-03-16 MED ORDER — ONDANSETRON HCL 4 MG/2ML IJ SOLN: 4.0000 mg | Freq: Once | INTRAMUSCULAR | Status: AC

## 2020-03-16 MED ORDER — SODIUM CHLORIDE 0.9 % IV SOLN
8.0000 mg/h | INTRAVENOUS | Status: DC
  Administered 2020-03-16 – 2020-03-18 (×4): 8 mg/h via INTRAVENOUS
  Filled 2020-03-16 (×6): qty 80

## 2020-03-16 MED ORDER — SODIUM CHLORIDE 0.9 % IV SOLN
80.0000 mg | Freq: Once | INTRAVENOUS | Status: AC
  Administered 2020-03-16: 80 mg via INTRAVENOUS
  Filled 2020-03-16: qty 80

## 2020-03-16 NOTE — H&P (Signed)
Date: 03/16/2020               Patient Name:  [name] MRN: [id number]  DOB: Oct 06, 1936 Age / Sex: 84 y.o., female   PCP: [name], MD         Medical Service: Internal Medicine Teaching Service         Attending Physician: Dr. Daryll Drown    First Contact: Dr. Charleen Kirks Pager: [phone]  Second Contact: Dr. Eileen Stanford Pager: [phone]       After Hours (After 5p/  First Contact Pager: [phone]  weekends / holidays): Second Contact Pager: [phone]   Chief Complaint: Constipation  History of Present Illness:   [name] is a 84 y/o female with a PMHx of previous upper GI bleeds 2/2 acute enterogastritis, COPD on 2L home O2, CKD Stage 3, CVA with L. Sided weakness, HTN who presents to the ED with c/o constipation.   She states that she has been constipated for the past 4-7 days which was unrelieved with laxatives. She reports that she periodically experiences BRBPR that is present each time she wipes for the last several weeks. She denies NSAIDs use. She denies worsening shortness of breath, fever, chills, nausea, vomiting, abdominal pain, lower extremity edema. Endorses dizziness.  She states that a couple of weeks ago she had an episode of blurry vision which resolved in 1 hour.   She does report of a longstanding history of hemorrhoids and had to use suppositories in her childhood. She eventually underwent hemorrhoids surgery.   ED Course: On arrival to the ED, her blood pressure was 113/48 with heart rate of 95, afebrile at 98.2 and respiratory rate of 18, saturating at 100% on 2 L nasal cannula.  Initial blood work and indicated elevated white blood count at 13.4 with a severely decreased hemoglobin at 4.2.  Platelets are elevated at 1006.  CMP positive for an elevated BUN at 26, and mildly elevated creatinine at 1.01. Due to fecal impaction, disimpaction was performed by ED provider that yielded melanotic stool.   Meds:  No current facility-administered medications  on file prior to encounter.   Current Outpatient Medications on File Prior to Encounter  Medication Sig Dispense Refill  . acetaminophen-codeine (TYLENOL #3) 300-30 MG tablet Take 1 tablet by mouth 2 (two) times daily as needed for severe pain. (Patient not taking: Reported on 06/19/2019) 30 tablet 0  . amLODipine (NORVASC) 5 MG tablet Take 1 tablet (5 mg total) by mouth daily. 30 tablet 1  . atorvastatin (LIPITOR) 20 MG tablet Take 1 tablet (20 mg total) by mouth daily at 6 PM. 30 tablet 1  . cyanocobalamin 1000 MCG tablet Take 1 tablet (1,000 mcg total) by mouth daily. 30 tablet 0  . diclofenac sodium (VOLTAREN) 1 % GEL Apply 2 g topically 4 (four) times daily as needed (pain).     Marland Kitchen gabapentin (NEURONTIN) 100 MG capsule Take 1 capsule (100 mg total) by mouth at bedtime. 30 capsule 1  . Menthol-Methyl Salicylate (MUSCLE RUB) 10-15 % CREA Apply 1 application topically 2 (two) times daily as needed for muscle pain.  0  . oxyCODONE-acetaminophen (PERCOCET) 7.5-325 MG tablet Take 1 tablet by mouth 2 (two) times daily as needed for severe pain.    . pantoprazole (PROTONIX) 40 MG tablet TAKE 1 TABLET BY MOUTH DAILY (Patient taking differently: Take 40 mg by mouth daily. ) 90 tablet 0  . psyllium (HYDROCIL/METAMUCIL) 95 % PACK Take 1 packet by mouth  2 (two) times daily. 240 each   . senna-docusate (SENOKOT-S) 8.6-50 MG tablet Take 2 tablets by mouth 2 (two) times daily. (Patient taking differently: Take 2 tablets by mouth 2 (two) times daily as needed for mild constipation. ) 120 tablet 1   Allergies: Allergies as of 03/16/2020 - Review Complete 03/16/2020  Allergen Reaction Noted  . Ambien [zolpidem] Other (See Comments) 12/20/2015  . Codeine Hives and Nausea And Vomiting 06/18/2019   Past Medical History:  Diagnosis Date  . Arthritis   . CAP (community acquired pneumonia)    multiple admissions  . Chronic back pain 08/22/2018  . Colitis 03/19/2014  . Compression fx, lumbar spine ([place])   .  COPD (chronic obstructive pulmonary disease) ([place])   . Delirium   . Hypertension   . On home oxygen therapy 06-14-13   continuos 2.5l/m nasally-24/7  . Osteoporosis   . Sacral fracture ([place]) 2014   Family History:  Family History  Problem Relation Age of Onset  . Hypertension Sister   . Diabetes Mellitus II Sister    Social History:  -Lives with son who takes care of her -Had 2 sons but one passed away -denies alcohol, tobacco and drug use   Review of Systems: A complete ROS was negative except as per HPI.   Physical Exam: Blood pressure (!) 135/51, pulse 83, temperature 98.3 F (36.8 C), temperature source Oral, resp. rate 15, SpO2 100 %.  Physical Exam Vitals and nursing note reviewed.  Constitutional:      General: She is not in acute distress.    Appearance: She is normal weight.  Eyes:     Pupils: Pupils are equal, round, and reactive to light.  Cardiovascular:     Rate and Rhythm: Normal rate and regular rhythm.     Heart sounds: Murmur (systolic murmur, 2/6) present.  Pulmonary:     Effort: Pulmonary effort is normal. No respiratory distress.     Breath sounds: Normal breath sounds.  Abdominal:     General: Bowel sounds are normal. There is no distension. There are no signs of injury.     Palpations: Abdomen is soft. There is no hepatomegaly or mass.     Tenderness: There is no abdominal tenderness.  Skin:    General: Skin is warm and dry.     Coloration: Skin is pale.  Neurological:     General: No focal deficit present.     Mental Status: She is alert and oriented to person, place, and time.  Psychiatric:        Mood and Affect: Mood normal.        Behavior: Behavior normal.    Assessment & Plan by Problem: Principal Problem:   GI bleed Active Problems:   Hypertension   COPD (chronic obstructive pulmonary disease) (HCC)   Chronic respiratory failure (HCC)   Back pain   Supplemental oxygen dependent   Thrombocytosis ([place])  [name] is a  84 y/o female with a PMHx of previous upper GI bleeds 2/2 acute enterogastritis, COPD on 2L home O2, CKD Stage 3, CVA, HTN currently admitted for upper GI bleed complicated by acute blood loss anemia.   # Upper GI Bleed  Patient has a history of esophagitis with gastritis leading to significant anemia, most recently in June 2020. Biopsies in May 2020 showed normal mucosa without evidence of EOE, metaplasia, dysplasia or malignancy. She endorses a long standing history of BPBPR, however has not noticed melena. On fecal disimpaction though, melanotic  stool was recorded.   - GI consulted; will follow up their recommendations  - Telemetry - LR 1 L bolus over 4 hours  - Pantoprazole 80 mg IV push followed by 8 mg/hr drip - Senna 2 tablets BID  - Hold Aspirin  # Blood Loss Anemia  # Iron Deficiency Anemia  Secondary to GI bleed described above. Besides dizziness, she is largely asymptomatic supporting that this has been a slow bleed. Currently, she continues to be hemodynamically stable. Iron saturation is 1% with iron levels of 5 and TIBC of 400. Total iron deficit is 1631 mg. She would benefit from Ferriheme, but will wait till tomorrow as currently prioritizing pRBCs. Reticulocyte index is 0.8 showing hypo-proliferation.   - s/p 2 units of pRBCs in the ED - post-transfusion CBC pending  - Transfuse to maintain hemoglobin >7.0 - CBC in the AM  # Thrombocytosis  Patient has a long standing hx of thrombocytosis, however acute worsening is likely reactive in light of GI bleed and iron deficiency anemia. No history of splenectomy per chart review. I am unable to find a previous workup, may benefit from hematology outpatient evaluation.   - CBC daily  - Smear pending   # COPD  Chronically on 2 L and current saturating well with home dose.   - Continue supplemental oxygen @ 2L [place]  # CKD Stage 3  Creatinine mildly elevated today at 1.01 likely from GI bleed however, elevation is not more than  0.3 above baseline.   - BMP in the AM  # Hypertension  - Continue home Amlodipine 10 mg   # Chronic Back Pain  Will switch to Tramadol given current constipation   - Tramadol 50 mg TID PRN   Diet: NPO VTE: SCDs IVF: LR,Bolus Code: DNR/DNI  Prior to Admission Living Arrangement: Home, living with her son Anticipated Discharge Location: TBD Barriers to Discharge: Continued medical evaluation  Dispo: Admit patient to Inpatient with expected length of stay greater than 2 midnights.  Signed: Dr. Jose Persia Internal Medicine PGY-1  Pager: [phone] 03/16/2020, 3:38 PM

## 2020-03-16 NOTE — Consult Note (Signed)
[facility] Gastroenterology Consultation Note  Referring Provider: Internal Medicine Teaching Service Primary Care Physician:  [name], MD Primary Gastroenterologist:  Dr.  Arta Silence  Reason for Consultation:  Anemia, blood in stool.  HPI: [name] is a 84 y.o. female presenting to ED with constipation and rectal pain.  No bowel movement for one week.  Feels rectal impaction.  Has some black and red blood per rectum.  Rectal pain, but no abdominal pain.  No vomiting or hematemesis.  Had multiple prior endoscopies for melena and anemia, with non-specific findings but no clear source.  No prior colonoscopy; she and I have talked about whether she should have one and, at time of our last consultation, she didn't want to have one done since she was doing ok and in light of her multiple medical problems.   Past Medical History:  Diagnosis Date  . Arthritis   . CAP (community acquired pneumonia)    multiple admissions  . Chronic back pain 08/22/2018  . Colitis 03/19/2014  . Compression fx, lumbar spine ([place])   . COPD (chronic obstructive pulmonary disease) ([place])   . Delirium   . Hypertension   . On home oxygen therapy 06-14-13   continuos 2.5l/[name] nasally-24/7  . Osteoporosis   . Sacral fracture ([place]) 2014    Past Surgical History:  Procedure Laterality Date  . ABDOMINAL HYSTERECTOMY    . BIOPSY  05/24/2019   Procedure: BIOPSY;  Surgeon: Ronald Lobo, MD;  Location: [place];  Service: Endoscopy;;  . ESOPHAGOGASTRODUODENOSCOPY N/A 06/15/2013   Procedure: ESOPHAGOGASTRODUODENOSCOPY (EGD);  Surgeon: [name], MD;  Location: [facility];  Service: Endoscopy;  Laterality: N/A;  . ESOPHAGOGASTRODUODENOSCOPY (EGD) WITH PROPOFOL N/A 05/24/2019   Procedure: ESOPHAGOGASTRODUODENOSCOPY (EGD) WITH PROPOFOL;  Surgeon: Ronald Lobo, MD;  Location: [place];  Service: Endoscopy;  Laterality: N/A;  . IR GENERIC HISTORICAL  10/15/2016   IR KYPHO LUMBAR INC FX  REDUCE BONE BX UNI/BIL CANNULATION INC/IMAGING 10/15/2016 Luanne Bras, MD [facility]  . sacroplasty  06-14-13   05-10-13 IVR CONE for fracture stabilization    Prior to Admission medications   Medication Sig Start Date End Date Taking? Authorizing Provider  acetaminophen-codeine (TYLENOL #3) 300-30 MG tablet Take 1 tablet by mouth 2 (two) times daily as needed for severe pain. Patient not taking: Reported on 06/19/2019 05/26/19   Love, Ivan Anchors, PA-C  amLODipine (NORVASC) 5 MG tablet Take 1 tablet (5 mg total) by mouth daily. 05/26/19   Love, Ivan Anchors, PA-C  atorvastatin (LIPITOR) 20 MG tablet Take 1 tablet (20 mg total) by mouth daily at 6 PM. 05/26/19   Love, Ivan Anchors, PA-C  cyanocobalamin 1000 MCG tablet Take 1 tablet (1,000 mcg total) by mouth daily. 05/26/19   Love, Ivan Anchors, PA-C  diclofenac sodium (VOLTAREN) 1 % GEL Apply 2 g topically 4 (four) times daily as needed (pain).     [provider]  gabapentin (NEURONTIN) 100 MG capsule Take 1 capsule (100 mg total) by mouth at bedtime. 05/26/19   Love, Ivan Anchors, PA-C  Menthol-Methyl Salicylate (MUSCLE RUB) 10-15 % CREA Apply 1 application topically 2 (two) times daily as needed for muscle pain. 05/26/19   Love, Ivan Anchors, PA-C  oxyCODONE-acetaminophen (PERCOCET) 7.5-325 MG tablet Take 1 tablet by mouth 2 (two) times daily as needed for severe pain.    [provider]  pantoprazole (PROTONIX) 40 MG tablet TAKE 1 TABLET BY MOUTH DAILY Patient taking differently: Take 40 mg by mouth daily.  05/26/19   Love, Ivan Anchors, PA-C  psyllium (HYDROCIL/METAMUCIL) 95 % PACK Take 1 packet by mouth 2 (two) times daily. 06/20/19   Georgette Shell, MD  senna-docusate (SENOKOT-S) 8.6-50 MG tablet Take 2 tablets by mouth 2 (two) times daily. Patient taking differently: Take 2 tablets by mouth 2 (two) times daily as needed for mild constipation.  05/26/19   Bary Leriche, PA-C    Current Facility-Administered Medications  Medication Dose  Route Frequency Provider Last Rate Last Admin  . 0.9 %  sodium chloride infusion  10 mL/hr Intravenous Once Agyei, Obed K, MD      . amLODipine (NORVASC) tablet 5 mg  5 mg Oral Daily Agyei, Obed K, MD      . atorvastatin (LIPITOR) tablet 20 mg  20 mg Oral q1800 Agyei, Obed K, MD      . Glycerin (Adult) 2.1 g suppository 1 suppository  1 suppository Rectal Daily Agyei, Obed K, MD      . lactated ringers bolus 1,000 mL  1,000 mL Intravenous Once Agyei, Obed K, MD      . pantoprazole (PROTONIX) 80 mg in sodium chloride 0.9 % 100 mL (0.8 mg/mL) infusion  8 mg/hr Intravenous Continuous Jean Rosenthal, MD 10 mL/hr at 03/16/20 1136 8 mg/hr at 03/16/20 1136  . ramelteon (ROZEREM) tablet 8 mg  8 mg Oral QHS Agyei, Obed K, MD      . senna-docusate (Senokot-S) tablet 2 tablet  2 tablet Oral BID Agyei, Obed K, MD      . traMADol (ULTRAM) tablet 50 mg  50 mg Oral Q8H PRN Jean Rosenthal, MD       Current Outpatient Medications  Medication Sig Dispense Refill  . acetaminophen-codeine (TYLENOL #3) 300-30 MG tablet Take 1 tablet by mouth 2 (two) times daily as needed for severe pain. (Patient not taking: Reported on 06/19/2019) 30 tablet 0  . amLODipine (NORVASC) 5 MG tablet Take 1 tablet (5 mg total) by mouth daily. 30 tablet 1  . atorvastatin (LIPITOR) 20 MG tablet Take 1 tablet (20 mg total) by mouth daily at 6 PM. 30 tablet 1  . cyanocobalamin 1000 MCG tablet Take 1 tablet (1,000 mcg total) by mouth daily. 30 tablet 0  . diclofenac sodium (VOLTAREN) 1 % GEL Apply 2 g topically 4 (four) times daily as needed (pain).     Marland Kitchen gabapentin (NEURONTIN) 100 MG capsule Take 1 capsule (100 mg total) by mouth at bedtime. 30 capsule 1  . Menthol-Methyl Salicylate (MUSCLE RUB) 10-15 % CREA Apply 1 application topically 2 (two) times daily as needed for muscle pain.  0  . oxyCODONE-acetaminophen (PERCOCET) 7.5-325 MG tablet Take 1 tablet by mouth 2 (two) times daily as needed for severe pain.    . pantoprazole (PROTONIX) 40 MG  tablet TAKE 1 TABLET BY MOUTH DAILY (Patient taking differently: Take 40 mg by mouth daily. ) 90 tablet 0  . psyllium (HYDROCIL/METAMUCIL) 95 % PACK Take 1 packet by mouth 2 (two) times daily. 240 each   . senna-docusate (SENOKOT-S) 8.6-50 MG tablet Take 2 tablets by mouth 2 (two) times daily. (Patient taking differently: Take 2 tablets by mouth 2 (two) times daily as needed for mild constipation. ) 120 tablet 1    Allergies as of 03/16/2020 - Review Complete 03/16/2020  Allergen Reaction Noted  . Ambien [zolpidem] Other (See Comments) 12/20/2015  . Codeine Hives and Nausea And Vomiting 06/18/2019    Family History  Problem Relation Age of Onset  .  Hypertension Sister   . Diabetes Mellitus II Sister     Social History   Socioeconomic History  . Marital status: Divorced    Spouse name: Not on file  . Number of children: Not on file  . Years of education: Not on file  . Highest education level: Not on file  Occupational History  . Not on file  Tobacco Use  . Smoking status: Former Smoker    Types: Cigarettes    Quit date: 06/15/2011    Years since quitting: 8.7  . Smokeless tobacco: Never Used  Substance and Sexual Activity  . Alcohol use: No  . Drug use: No  . Sexual activity: Never  Other Topics Concern  . Not on file  Social History Narrative  . Not on file   Social Determinants of Health   Financial Resource Strain:   . Difficulty of Paying Living Expenses:   Food Insecurity:   . Worried About Charity fundraiser in the Last Year:   . Arboriculturist in the Last Year:   Transportation Needs:   . Film/video editor (Medical):   Marland Kitchen Lack of Transportation (Non-Medical):   Physical Activity:   . Days of Exercise per Week:   . Minutes of Exercise per Session:   Stress:   . Feeling of Stress :   Social Connections:   . Frequency of Communication with Friends and Family:   . Frequency of Social Gatherings with Friends and Family:   . Attends Religious  Services:   . Active Member of Clubs or Organizations:   . Attends Archivist Meetings:   Marland Kitchen Marital Status:   Intimate Partner Violence:   . Fear of Current or Ex-Partner:   . Emotionally Abused:   Marland Kitchen Physically Abused:   . Sexually Abused:     Review of Systems: As per HPI, all others negative  Physical Exam: Vital signs in last 24 hours: Temp:  [97.4 F (36.3 C)-98.3 F (36.8 C)] 98.3 F (36.8 C) (03/20 1520) Pulse Rate:  [79-98] 83 (03/20 1520) Resp:  [15-30] 15 (03/20 1520) BP: (106-136)/(41-51) 135/51 (03/20 1520) SpO2:  [97 %-100 %] 100 % (03/20 1505)   General:   Elderly, cachectic- and frail-appearing, uncomfortable-appearing Head:  Normocephalic and atraumatic. Eyes:  Sclera clear, no icterus.   Conjunctiva pink. Ears:  Normal auditory acuity. Nose:  No deformity, discharge,  or lesions. Mouth:  No deformity or lesions.  Oropharynx pink & moist. Neck:  Supple; no masses or thyromegaly. Lungs:  Clear throughout to auscultation.   No wheezes, crackles, or rhonchi. No acute distress. Heart:  Regular rate and rhythm; no murmurs, clicks, rubs,  or gallops. Abdomen:  Soft, mild protuberant, nontender and nondistended. No masses, hepatosplenomegaly or hernias noted. Normal bowel sounds, without guarding, and without rebound.     Rectal exam:  Tender anorectal canal; impacted stool palpated. Msk:  Muscular atrophy/cachexia. Normal posture. Pulses:  Normal pulses noted. Extremities:  Without clubbing or edema. Neurologic:  Alert and  oriented x4; diffusely weak, otherwise grossly normal neurologically. Skin:  Intact without significant lesions or rashes. Psych:  Alert and cooperative. Normal mood and affect.   Lab Results: Recent Labs    03/16/20 1044  WBC 13.4*  HGB 4.2*  HCT 16.9*  PLT 1,006*   BMET Recent Labs    03/16/20 1044  NA 141  K 3.5  CL 104  CO2 24  GLUCOSE 243*  BUN 26*  CREATININE 1.01*  CALCIUM 8.4*  LFT Recent Labs     03/16/20 1044  PROT 6.4*  ALBUMIN 2.7*  AST 16  ALT 20  ALKPHOS 83  BILITOT 0.6   PT/INR Recent Labs    03/16/20 1100  LABPROT 13.4  INR 1.0    Studies/Results: No results found.  Impression:  1.  Rectal pain. 2.  Constipation with fecal impaction. 3.  Blood in stool; stercoral ulcer?  Stercoral proctitis? 4.  Anemia, Hgb 4.2   5.  Profound thrombocytopenia, well out of range expected for reactive changes to inflammation or iron deficiency.  Wonder if she has some sort of bone marrow process. 6.  COPD and multiple comorbidities.  Plan:  1.  Patient's anorectal area very tender, would try to pursue gentle oral prep first and, if not helpful, nursing manual disimpaction +/- SMOG enema. 2.  Volume repletion, PPI, serial CBCs, blood transfusion. 3.  [facility] will follow.   LOS: 0 days   [name]  03/16/2020, 4:11 PM  Cell [phone] If no answer or after 5 PM call [phone]

## 2020-03-16 NOTE — ED Notes (Addendum)
Pt was provided sprite and water per doctor's permission. Head of bed was placed in a position of comfort and door was cracked as comfort measures.

## 2020-03-16 NOTE — ED Provider Notes (Signed)
[facility] EMERGENCY DEPARTMENT Provider Note   CSN: [id number] Arrival date & time: 03/16/20  1016     History Chief Complaint  Patient presents with  . Constipation  . Abdominal Pain    [name] is a 84 y.o. female.  Patient with history of COPD on 2 L chronic supplemental home oxygen, sacral ulcer, GI bleed due to esophagitis/gastritis, chronic kidney disease --presents to the emergency department with complaint of constipation for 4 days as well as "red red" blood in the stool.  Patient states that she has intermittent abdominal cramps/spasms.  She has rectal pain.  She does not report any fevers or vomiting.  She states that she took a laxative and applied Preparation H over the past few days due to her symptoms.          Past Medical History:  Diagnosis Date  . Arthritis   . CAP (community acquired pneumonia)    multiple admissions  . Chronic back pain 08/22/2018  . Colitis 03/19/2014  . Compression fx, lumbar spine ([place])   . COPD (chronic obstructive pulmonary disease) ([place])   . Delirium   . Hypertension   . On home oxygen therapy 06-14-13   continuos 2.5l/m nasally-24/7  . Osteoporosis   . Sacral fracture [facility]) 2014    Patient Active Problem List   Diagnosis Date Noted  . Left knee pain 06/18/2019  . GI bleed 06/17/2019  . 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 Acute blood loss anemia   . Anemia of chronic disease   . Supplemental oxygen dependent   . Right pontine stroke ([place]) 05/02/2019  . Stroke ([place]) 05/01/2019  . Acute renal failure superimposed on stage 3 chronic kidney disease ([place]) 05/01/2019  . Acute ischemic stroke ([place]) 05/01/2019  . AKI (acute kidney injury) ([place]) 08/29/2018  . Encephalopathy acute 08/22/2018  . Chronic back pain 08/22/2018  . Vitamin B12 deficiency 08/19/2018  . Dehydration   . Tachycardia   . Altered mental state 08/18/2018  . Closed compression fracture of L1 vertebra ([facility])   . Acute cystitis without  hematuria   . Pain management   . Intractable pain   . Back pain 10/10/2016  . L2 vertebral fracture ([place]) 10/10/2016  . Acute encephalopathy 12/25/2015  . Drug-induced delirium 12/25/2015  . Acute on chronic respiratory failure ([place]) 12/25/2015  . CAP (community acquired pneumonia) 12/19/2015  . Chronic respiratory failure ([place]) 03/19/2014  . Depression 03/19/2014  . Aspiration pneumonia ([place]) 03/10/2014  . Acute respiratory failure with hypoxia ([place]) 03/10/2014  . Sepsis ([place]) 03/10/2014  . Hypokalemia 03/10/2014  . Hypertension 03/10/2014  . COPD (chronic obstructive pulmonary disease) ([place]) 03/10/2014  . Tobacco abuse 03/10/2014  . Normocytic anemia 06/15/2013  . Generalized abdominal pain 06/15/2013  . Blood in the stool 06/15/2013    Past Surgical History:  Procedure Laterality Date  . ABDOMINAL HYSTERECTOMY    . BIOPSY  05/24/2019   Procedure: BIOPSY;  Surgeon: Ronald Lobo, MD;  Location: [place];  Service: Endoscopy;;  . ESOPHAGOGASTRODUODENOSCOPY N/A 06/15/2013   Procedure: ESOPHAGOGASTRODUODENOSCOPY (EGD);  Surgeon: [name], MD;  Location: [facility];  Service: Endoscopy;  Laterality: N/A;  . ESOPHAGOGASTRODUODENOSCOPY (EGD) WITH PROPOFOL N/A 05/24/2019   Procedure: ESOPHAGOGASTRODUODENOSCOPY (EGD) WITH PROPOFOL;  Surgeon: Ronald Lobo, MD;  Location: [facility];  Service: Endoscopy;  Laterality: N/A;  . IR GENERIC HISTORICAL  10/15/2016   IR KYPHO LUMBAR INC FX REDUCE BONE BX UNI/BIL CANNULATION INC/IMAGING 10/15/2016 Lu[facility] Bras, MD [facility]  .  sacroplasty  06-14-13   05-10-13 IVR [facility] for fracture stabilization     OB History   No obstetric history on file.     Family History  Problem Relation Age of Onset  . Hypertension Sister   . Diabetes Mellitus II Sister     Social History   Tobacco Use  . Smoking status: Former Smoker    Types: Cigarettes    Quit date: 06/15/2011    Years since quitting: 8.7  . Smokeless  tobacco: Never Used  Substance Use Topics  . Alcohol use: No  . Drug use: No    Home Medications Prior to Admission medications   Medication Sig Start Date End Date Taking? Authorizing Provider  acetaminophen-codeine (TYLENOL #3) 300-30 MG tablet Take 1 tablet by mouth 2 (two) times daily as needed for severe pain. Patient not taking: Reported on 06/19/2019 05/26/19   Love, Ivan Anchors, PA-C  amLODipine (NORVASC) 5 MG tablet Take 1 tablet (5 mg total) by mouth daily. 05/26/19   Love, Ivan Anchors, PA-C  atorvastatin (LIPITOR) 20 MG tablet Take 1 tablet (20 mg total) by mouth daily at 6 PM. 05/26/19   Love, Ivan Anchors, PA-C  cyanocobalamin 1000 MCG tablet Take 1 tablet (1,000 mcg total) by mouth daily. 05/26/19   Love, Ivan Anchors, PA-C  diclofenac sodium (VOLTAREN) 1 % GEL Apply 2 g topically 4 (four) times daily as needed (pain).     [provider]  gabapentin (NEURONTIN) 100 MG capsule Take 1 capsule (100 mg total) by mouth at bedtime. 05/26/19   Love, Ivan Anchors, PA-C  Menthol-Methyl Salicylate (MUSCLE RUB) 10-15 % CREA Apply 1 application topically 2 (two) times daily as needed for muscle pain. 05/26/19   Love, Ivan Anchors, PA-C  oxyCODONE-acetaminophen (PERCOCET) 7.5-325 MG tablet Take 1 tablet by mouth 2 (two) times daily as needed for severe pain.    [provider]  pantoprazole (PROTONIX) 40 MG tablet TAKE 1 TABLET BY MOUTH DAILY Patient taking differently: Take 40 mg by mouth daily.  05/26/19   Love, Ivan Anchors, PA-C  psyllium (HYDROCIL/METAMUCIL) 95 % PACK Take 1 packet by mouth 2 (two) times daily. 06/20/19   Georgette Shell, MD  senna-docusate (SENOKOT-S) 8.6-50 MG tablet Take 2 tablets by mouth 2 (two) times daily. Patient taking differently: Take 2 tablets by mouth 2 (two) times daily as needed for mild constipation.  05/26/19   Love, Ivan Anchors, PA-C    Allergies    Ambien [zolpidem] and Codeine  Review of Systems   Review of Systems  Constitutional: Negative for fever.  HENT:  Negative for rhinorrhea and sore throat.   Eyes: Negative for redness.  Respiratory: Negative for cough.   Cardiovascular: Negative for chest pain.  Gastrointestinal: Positive for abdominal pain, blood in stool, constipation and rectal pain. Negative for diarrhea, nausea and vomiting.  Genitourinary: Negative for dysuria.  Musculoskeletal: Negative for myalgias.  Skin: Positive for wound. Negative for rash.  Neurological: Negative for headaches.    Physical Exam Updated Vital Signs BP (!) 124/46 (BP Location: Left Arm)   Pulse 94   Temp 98.2 F (36.8 C) (Oral)   Resp (!) 30   SpO2 100%   Physical Exam Vitals and nursing note reviewed. Exam conducted with a chaperone present.  Constitutional:      Appearance: She is well-developed.  HENT:     Head: Normocephalic and atraumatic.  Eyes:     General:        Right eye: No  discharge.        Left eye: No discharge.     Conjunctiva/sclera: Conjunctivae normal.  Cardiovascular:     Rate and Rhythm: Normal rate and regular rhythm.     Heart sounds: Normal heart sounds.  Pulmonary:     Effort: Pulmonary effort is normal.     Breath sounds: Normal breath sounds.  Abdominal:     Palpations: Abdomen is soft.     Tenderness: There is no abdominal tenderness. There is no guarding or rebound.     Comments: No anterior abdominal tenderness at time of exam.  Genitourinary:    Rectum: Tenderness and external hemorrhoid present.     Comments: Fecal impaction present. Stool is very dark in color. No red blood.  Musculoskeletal:     Cervical back: Normal range of motion and neck supple.  Skin:    General: Skin is warm and dry.     Coloration: Skin is pale.     Comments: There is a 1cm superficial decubitus ulcer noted to the left sacral area with surrounding skin irritation without other areas of breakdown.  Neurological:     Mental Status: She is alert.     ED Results / Procedures / Treatments   Labs (all labs ordered are listed,  but only abnormal results are displayed) Labs Reviewed  CBC WITH DIFFERENTIAL/PLATELET - Abnormal; Notable for the following components:      Result Value   WBC 13.4 (*)    RBC 2.24 (*)    Hemoglobin 4.2 (*)    HCT 16.9 (*)    MCV 75.4 (*)    MCH 18.8 (*)    MCHC 24.9 (*)    RDW 19.4 (*)    Platelets 1,006 (*)    nRBC 0.9 (*)    Neutro Abs 11.6 (*)    Abs Immature Granulocytes 0.10 (*)    All other components within normal limits  COMPREHENSIVE METABOLIC PANEL - Abnormal; Notable for the following components:   Glucose, Bld 243 (*)    BUN 26 (*)    Creatinine, Ser 1.01 (*)    Calcium 8.4 (*)    Total Protein 6.4 (*)    Albumin 2.7 (*)    GFR calc non Af Amer 52 (*)    All other components within normal limits  POC OCCULT BLOOD, ED - Abnormal; Notable for the following components:   Fecal Occult Bld POSITIVE (*)    All other components within normal limits  SARS CORONAVIRUS 2 (TAT 6-24 HRS)  PROTIME-INR  TYPE AND SCREEN  PREPARE RBC (CROSSMATCH)    EKG None  Radiology No results found.  Procedures Procedures (including critical care time)  Medications Ordered in ED Medications  pantoprazole (PROTONIX) 80 mg in sodium chloride 0.9 % 100 mL (0.8 mg/mL) infusion (8 mg/hr Intravenous New Bag/Given 03/16/20 1136)  0.9 %  sodium chloride infusion (has no administration in time range)  ondansetron (ZOFRAN) injection 4 mg (has no administration in time range)  ondansetron (ZOFRAN) 4 MG/2ML injection (has no administration in time range)  Glycerin (Adult) 2.1 g suppository 1 suppository (1 suppository Rectal Given 03/16/20 1144)  pantoprazole (PROTONIX) 80 mg in sodium chloride 0.9 % 100 mL IVPB (80 mg Intravenous New Bag/Given 03/16/20 1138)    ED Course  I have reviewed the triage vital signs and the nursing notes.  Pertinent labs & imaging results that were available during my care of the patient were reviewed by me and considered in my medical decision  making (see  chart for details).  Patient seen and examined.  Patient examined with assistance of RN and nursing student.  Patient with fecal impaction noted.  Attempts made at manual disimpaction with partial removal of dark stool.  Patient with significant discomfort during procedure.  Patient does appear to be pale.  Concern for upper GI bleeding.  Hemoccult, hemoglobin pending.  Abdominal exam is actually reassuring and patient does not have any reproducible tenderness to palpation over her abdomen.  Vital signs reviewed and are as follows: BP (!) 124/46 (BP Location: Left Arm)   Pulse 94   Temp 98.2 F (36.8 C) (Oral)   Resp (!) 30   SpO2 100%   11:32 AM hemoglobin in fours.  Blood ordered.  I discussed the results with the patient who agrees to blood transfusion and to admission to the hospital.  I obtained permission to discuss case with her son and give an update.  Patient does not appear to be on any blood thinners at this time.  Will confirm with family.  Protonix started.  Spoke with son [name] by telephone. Confirmed pt is on baby ASA daily, no blood thinners. Agrees with blood transfusion.   Pt seen by Dr. Langston Masker.   11:46 AM Spoke with Dr. Paulita Fujita, [facility], who will consult on patient.    1:04 PM IMTS to see.    Clinical Course as of Mar 17 1211  Sat Mar 16, 2020  1149 Pt seen by myself as well as PA provider.  Briefly 84 yo female w/ hx of stroke not on a/c presenting with blood in stool and reported hematuria.  Found to have hgb 4.2 here.  Significantly elevated platelets which may be a reactive thrombocytosis.  Underwent fecal disimpactation by PA provider in the ED.  Family members & patient consented for pRBC transfusion.  Will need admission.  She is hemodynamically stable, and I do not think she is having an active hemorrhage requiring urgent intervention at this time.       [MT]    Clinical Course User Index [MT] Trifan, Carola Rhine, MD    CRITICAL CARE Performed by: Carlisle Cater PA-C Total critical care time: 40 minutes Critical care time was exclusive of separately billable procedures and treating other patients. Critical care was necessary to treat or prevent imminent or life-threatening deterioration. Critical care was time spent personally by me on the following activities: development of treatment plan with patient and/or surrogate as well as nursing, discussions with consultants, evaluation of patient's response to treatment, examination of patient, obtaining history from patient or surrogate, ordering and performing treatments and interventions, ordering and review of laboratory studies, ordering and review of radiographic studies, pulse oximetry and re-evaluation of patient's condition.     MDM Rules/Calculators/A&P                      Admit.    Final Clinical Impression(s) / ED Diagnoses Final diagnoses:  Upper GI bleed  Fecal impaction in rectum (HCC)  Symptomatic anemia  Pressure injury of sacral region, stage 1    Rx / DC Orders ED Discharge Orders    None       Carlisle Cater, PA-C 03/16/20 1305    Wyvonnia Dusky, MD 03/16/20 [phone]

## 2020-03-16 NOTE — ED Triage Notes (Signed)
Pt BIB [facility] from home. Pt complaining of ongoing constipation and abdominal cramping. Pt reports not having a bowel movement in four days. Pt also reports recent rectal bleeding. Hernia present RUQ. VSS.

## 2020-03-17 DIAGNOSIS — K5289 Other specified noninfective gastroenteritis and colitis: Secondary | ICD-10-CM

## 2020-03-17 DIAGNOSIS — Z9981 Dependence on supplemental oxygen: Secondary | ICD-10-CM

## 2020-03-17 DIAGNOSIS — K5641 Fecal impaction: Secondary | ICD-10-CM

## 2020-03-17 DIAGNOSIS — J449 Chronic obstructive pulmonary disease, unspecified: Secondary | ICD-10-CM

## 2020-03-17 DIAGNOSIS — D509 Iron deficiency anemia, unspecified: Secondary | ICD-10-CM

## 2020-03-17 DIAGNOSIS — M549 Dorsalgia, unspecified: Secondary | ICD-10-CM

## 2020-03-17 DIAGNOSIS — K922 Gastrointestinal hemorrhage, unspecified: Secondary | ICD-10-CM

## 2020-03-17 DIAGNOSIS — N183 Chronic kidney disease, stage 3 unspecified: Secondary | ICD-10-CM

## 2020-03-17 DIAGNOSIS — Z79891 Long term (current) use of opiate analgesic: Secondary | ICD-10-CM

## 2020-03-17 DIAGNOSIS — Z9889 Other specified postprocedural states: Secondary | ICD-10-CM

## 2020-03-17 DIAGNOSIS — I129 Hypertensive chronic kidney disease with stage 1 through stage 4 chronic kidney disease, or unspecified chronic kidney disease: Secondary | ICD-10-CM

## 2020-03-17 DIAGNOSIS — Z79899 Other long term (current) drug therapy: Secondary | ICD-10-CM

## 2020-03-17 DIAGNOSIS — D62 Acute posthemorrhagic anemia: Secondary | ICD-10-CM

## 2020-03-17 DIAGNOSIS — D473 Essential (hemorrhagic) thrombocythemia: Secondary | ICD-10-CM

## 2020-03-17 DIAGNOSIS — G8929 Other chronic pain: Secondary | ICD-10-CM

## 2020-03-17 LAB — BASIC METABOLIC PANEL
Anion gap: 10 (ref 5–15)
BUN: 19 mg/dL (ref 8–23)
CO2: 26 mmol/L (ref 22–32)
Calcium: 8.2 mg/dL — ABNORMAL LOW (ref 8.9–10.3)
Chloride: 101 mmol/L (ref 98–111)
Creatinine, Ser: 0.68 mg/dL (ref 0.44–1.00)
GFR calc Af Amer: >60 mL/min (ref >60)
GFR calc non Af Amer: >60 mL/min (ref >60)
Glucose, Bld: 97 mg/dL (ref 70–99)
Potassium: 3.9 mmol/L (ref 3.5–5.1)
Sodium: 137 mmol/L (ref 135–145)

## 2020-03-17 LAB — CBC
HCT: 24.6 % — ABNORMAL LOW (ref 36.0–46.0)
Hemoglobin: 7.2 g/dL — ABNORMAL LOW (ref 12.0–15.0)
MCH: 23.4 pg — ABNORMAL LOW (ref 26.0–34.0)
MCHC: 29.3 g/dL — ABNORMAL LOW (ref 30.0–36.0)
MCV: 79.9 fL — ABNORMAL LOW (ref 80.0–100.0)
Platelets: 761 10*3/uL — ABNORMAL HIGH (ref 150–400)
RBC: 3.08 MIL/uL — ABNORMAL LOW (ref 3.87–5.11)
RDW: 22 % — ABNORMAL HIGH (ref 11.5–15.5)
WBC: 10.6 10*3/uL — ABNORMAL HIGH (ref 4.0–10.5)
nRBC: 0.9 % — ABNORMAL HIGH (ref 0.0–0.2)

## 2020-03-17 MED ORDER — ACETAMINOPHEN 500 MG PO TABS
1000.0000 mg | ORAL_TABLET | Freq: Three times a day (TID) | ORAL | Status: DC
  Administered 2020-03-17 – 2020-03-19 (×7): 1000 mg via ORAL
  Filled 2020-03-17 (×7): qty 2

## 2020-03-17 MED ORDER — SORBITOL 70 % SOLN
960.0000 mL | TOPICAL_OIL | Freq: Once | ORAL | Status: AC
  Administered 2020-03-17: 960 mL via RECTAL
  Filled 2020-03-17: qty 473

## 2020-03-17 MED ORDER — ACETAMINOPHEN 500 MG PO TABS: 1000.0000 mg | ORAL_TABLET | Freq: Four times a day (QID) | ORAL | Status: DC | PRN

## 2020-03-17 MED ORDER — CETAPHIL MOISTURIZING EX LOTN
TOPICAL_LOTION | CUTANEOUS | Status: DC | PRN
  Filled 2020-03-17: qty 473

## 2020-03-17 MED ORDER — SODIUM CHLORIDE 0.9 % IV SOLN
510.0000 mg | Freq: Once | INTRAVENOUS | Status: AC
  Administered 2020-03-17: 510 mg via INTRAVENOUS
  Filled 2020-03-17: qty 17

## 2020-03-17 MED ORDER — PIPERACILLIN-TAZOBACTAM 3.375 G IVPB
3.3750 g | Freq: Three times a day (TID) | INTRAVENOUS | Status: DC
  Administered 2020-03-17 – 2020-03-18 (×4): 3.375 g via INTRAVENOUS
  Filled 2020-03-17 (×5): qty 50

## 2020-03-17 NOTE — Progress Notes (Signed)
  Date: 03/17/2020  Patient name: [name]  Medical record number: [id number]  Date of birth: 1936-04-21   I have seen and evaluated [name] and discussed their care with the Residency Team. Briefly, [name] is an 84 year old woman who presented with lower GI bleeding, colitis, acute blood loss anemia.  Her anemia responded well to blood transfusion and she has had improvement in her fecal impaction with a SMOG enema today.  She was placed on Zosyn for the colitis.   Vitals:   03/17/20 1428 03/17/20 1429  BP: 90/74   Pulse:    Resp:  17  Temp:    SpO2:  100%   General: Lying in bed, appears fatigued Eyes: Mild conjunctival pallor HENT: Neck is supple CV: RR, NR PUlm: Breathing comfortably, no increased WOB Abd: Tender in the RUQ, +BS Skin: Warm and dry, capillary refill slightly delayed  Assessment and Plan: I have seen and evaluated the patient as outlined above. I agree with the formulated Assessment and Plan as detailed in the residents' note, with the following changes:   1. Acute GI Bleeding, Acute blood loss anemia - Pantoprazole - Hold aspirin - H/H stabilized - Will given Iron today - Follow up GI recommendations  2. Stercoral colitis - SMOG enema - Zosyn - Follow up GI recommendations  3. Thrombocytosis - Likely related to acute anemia - Trend and monitor for worsening.   Other issues per Dr. Noni Saupe note.   Sid Falcon, MD 3/21/20217:27 PM

## 2020-03-17 NOTE — Plan of Care (Signed)
  Problem: Education: Goal: Knowledge of General Education information will improve Description: Including pain rating scale, medication(s)/side effects and non-pharmacologic comfort measures Outcome: Progressing   Problem: Coping: Goal: Level of anxiety will decrease Outcome: Not Progressing   Problem: Elimination: Goal: Will not experience complications related to bowel motility Outcome: Not Progressing   Problem: Safety: Goal: Ability to remain free from injury will improve Outcome: Progressing

## 2020-03-17 NOTE — Progress Notes (Signed)
Subjective:   [name] states she continues to feel quite impacted this a.m. and is having acute worsening of her chronic back pain.  She is eager to try an enema for relief.  She states her abdominal pain has improved and denies any nausea or vomiting.  Objective:  Vital signs in last 24 hours: Vitals:   03/16/20 1729 03/16/20 1945 03/17/20 0009 03/17/20 0326  BP: 127/61 (!) 146/57 (!) 121/47 (!) 125/59  Pulse: 89 79 82 88  Resp: 18 16 19 16   Temp: (!) 97.3 F (36.3 C) 97.8 F (36.6 C) 98.3 F (36.8 C) 98.4 F (36.9 C)  TempSrc: Oral Oral Oral Oral  SpO2: 95% 100% 100% 100%  Weight:    52 kg    Physical Exam Vitals and nursing note reviewed.  Constitutional:      Appearance: She is normal weight.  Cardiovascular:     Rate and Rhythm: Normal rate and regular rhythm.  Pulmonary:     Effort: Pulmonary effort is normal.     Breath sounds: Normal breath sounds.  Abdominal:     General: Bowel sounds are decreased. There is no distension. There are no signs of injury.     Palpations: Abdomen is soft.     Tenderness: There is abdominal tenderness in the right upper quadrant. There is no guarding.  Skin:    General: Skin is warm and dry.  Neurological:     General: No focal deficit present.     Mental Status: She is alert and oriented to person, place, and time.  Psychiatric:        Mood and Affect: Mood normal.        Behavior: Behavior normal.    Assessment/Plan:  Principal Problem:   GI bleed Active Problems:   Hypertension   COPD (chronic obstructive pulmonary disease) (HCC)   Chronic respiratory failure (HCC)   Back pain   Supplemental oxygen dependent   Thrombocytosis (HCC)   Pressure injury of skin  [name] is a 84 y/o female with a PMHx of previous upper GI bleeds 2/2 acute enterogastritis, COPD on 2L home O2, CKD Stage 3, CVA, HTN currently admitted for upper GI bleed complicated by acute blood loss anemia.   # Stercoral Colitis Suspected  after very tender rectal exam and confirmed with CT abdomen/pelvis with IV contrast which showed large fecaloma in rectal vault.  She had a manual disimpaction in the ED, but was not completely relieved by this.  Smog enema was ordered this morning and yielded a very large BM.  Patient feels significant relieved at this time.  We also started her on Zosyn given high risk for infection and leukocytosis on presentation.  She has remained afebrile, no evidence of perforation at this time.  - GI following; we appreciate their recommendations - s/p SMOG enema - Senna 2 tablets BID  - Zosyn per pharmacy dosing   # Upper GI Bleed  Melanotic stool noticed today after smog enema however patient notes GI bleed is intermittent.  Likely secondary to esophagitis and gastritis.   - Pantoprazole 80 mg IV push followed by 8 mg/hr drip - Hold Aspirin  # Blood Loss Anemia  # Iron Deficiency Anemia  Secondary to GI bleed described above.  Largely asymptomatic.  Hemoglobin responded well to 2 units of PRBCs in the ED.  Given her low iron, will replace today.  - CBC in the AM - Transfuse to maintain hemoglobin >7.0 - Feraheme x1 today  #  Thrombocytosis  Improving today  - CBC daily  - Smear review pending   # COPD  Chronically on 2 L and current saturating well with home dose.   - Continue supplemental oxygen @ 2L [place]  # CKD Stage 3  Creatinine greatly improved today at 0.68 today.  We will continue to monitor  # Hypertension  - Continue home Amlodipine 10 mg   # Chronic Back Pain  - Tramadol 50 mg TID PRN   Dispo: Anticipated discharge in approximately 2-3 day(s).   Dr. Jose Persia Internal Medicine PGY-1  Pager: [phone] 03/17/2020, 6:56 AM

## 2020-03-17 NOTE — Progress Notes (Signed)
Pt has large bm from smog enema. Rectum empty when digitally assessed and pt states she feels as thought she is empty. Last stool that came out was brown with a streak of blood. Carroll Kinds RN

## 2020-03-17 NOTE — Progress Notes (Signed)
Subjective: Rectal pain and impaction has improved after smog enema. Scant rectal bleeding.  Objective: Vital signs in last 24 hours: Temp:  [97.3 F (36.3 C)-98.4 F (36.9 C)] 98.4 F (36.9 C) (03/21 0326) Pulse Rate:  [79-89] 88 (03/21 0326) Resp:  [15-19] 16 (03/21 0326) BP: (121-146)/(47-61) 125/59 (03/21 0326) SpO2:  [95 %-100 %] 100 % (03/21 0326) Weight:  [52 kg] 52 kg (03/21 0326) Weight change:  Last BM Date: 03/09/20  PE: GEN:  NAD, chronically ill- and frail-appearing ABD:  Soft, non-tender  Lab Results: CBC    Component Value Date/Time   WBC 10.6 (H) 03/17/2020 0324   RBC 3.08 (L) 03/17/2020 0324   HGB 7.2 (L) 03/17/2020 0324   HCT 24.6 (L) 03/17/2020 0324   PLT 761 (H) 03/17/2020 0324   MCV 79.9 (L) 03/17/2020 0324   MCH 23.4 (L) 03/17/2020 0324   MCHC 29.3 (L) 03/17/2020 0324   RDW 22.0 (H) 03/17/2020 0324   LYMPHSABS 1.3 03/16/2020 1807   MONOABS 0.8 03/16/2020 1807   EOSABS 0.2 03/16/2020 1807   BASOSABS 0.1 03/16/2020 1807   CMP     Component Value Date/Time   NA 137 03/17/2020 0324   K 3.9 03/17/2020 0324   CL 101 03/17/2020 0324   CO2 26 03/17/2020 0324   GLUCOSE 97 03/17/2020 0324   BUN 19 03/17/2020 0324   CREATININE 0.68 03/17/2020 0324   CALCIUM 8.2 (L) 03/17/2020 0324   PROT 6.4 (L) 03/16/2020 1044   ALBUMIN 2.7 (L) 03/16/2020 1044   AST 16 03/16/2020 1044   ALT 20 03/16/2020 1044   ALKPHOS 83 03/16/2020 1044   BILITOT 0.6 03/16/2020 1044   GFRNONAA >60 03/17/2020 0324   GFRAA >60 03/17/2020 0324   Assessment:  1.  Rectal pain.  Much improved. 2.  Constipation with fecal impaction.  Much improved. 3.  Blood in stool; stercoral ulcer?  Stercoral proctitis? 4.  Anemia, Hgb 4.2, improved after transfusion. 5.  Profound thrombocytosis, well out of range expected for reactive changes to inflammation or iron deficiency.  Concomitant bone marrow process? 6.  COPD and multiple comorbidities.  Plan:  1.  Continue bowel regimen  (Miralax TID and SMOG enemas). 2.  Advance to soft diet today (patient request). 3.  Advised patient that likely would do colonoscopy some time in the next few days, and today's soft diet is transient and likely to be discontinued to clear liquids again tomorrow. 4.  [facility] will follow.   Landry Dyke 03/17/2020, 2:33 PM   Cell [phone] If no answer or after 5 PM call [phone]

## 2020-03-17 NOTE — Progress Notes (Signed)
Pharmacy Antibiotic Note  [name] is a 84 y.o. female admitted on 03/16/2020 with colitis.  Pharmacy has been consulted for zosyn dosing.  Presented with constipation and rectal pain - has been having some black/red blood per rectum per report. CT ab showing rectal wall thickening with adjacent free fluid and fat stranding concerning for stercoral colitis. Scr improved from 1.01 to 0.68. WBC down from 13.4 to 10.6. LA 1.2. Afebrile.   Plan: Zosyn 3.375g IV q8h (4 hour infusion).  Monitor renal fx, cx results, clinical pic, and opportunities for de-escalation  Weight: 114 lb 9.6 oz (52 kg)  Temp (24hrs), Avg:98 F (36.7 C), Min:97.3 F (36.3 C), Max:98.4 F (36.9 C)  Recent Labs  Lab 03/16/20 1044 03/16/20 1807 03/16/20 1832 03/17/20 0324  WBC 13.4* 11.3*  --  10.6*  CREATININE 1.01*  --   --  0.68  LATICACIDVEN  --   --  1.2  --     CrCl cannot be calculated (Unknown ideal weight.).    Allergies  Allergen Reactions  . Ambien [Zolpidem] Other (See Comments)    Hallucinations and paranoid  . Codeine Hives and Nausea And Vomiting    Antimicrobials this admission: Zosyn 3/21 >>   Dose adjustments this admission: N/A  Microbiology results: 3/20 COVID PCR: neg  Thank you for allowing pharmacy to be a part of this patient's care.  Antonietta Jewel, PharmD, BCCCP Clinical Pharmacist  Phone: [phone]  Please check AMION for all [place] phone numbers After 10:00 PM, call [place] [phone] 03/17/2020 8:25 AM

## 2020-03-18 DIAGNOSIS — M810 Age-related osteoporosis without current pathological fracture: Secondary | ICD-10-CM

## 2020-03-18 DIAGNOSIS — K429 Umbilical hernia without obstruction or gangrene: Secondary | ICD-10-CM

## 2020-03-18 DIAGNOSIS — K921 Melena: Secondary | ICD-10-CM

## 2020-03-18 DIAGNOSIS — Z8673 Personal history of transient ischemic attack (TIA), and cerebral infarction without residual deficits: Secondary | ICD-10-CM

## 2020-03-18 DIAGNOSIS — Z8719 Personal history of other diseases of the digestive system: Secondary | ICD-10-CM

## 2020-03-18 LAB — BASIC METABOLIC PANEL
Anion gap: 9 (ref 5–15)
BUN: 16 mg/dL (ref 8–23)
CO2: 29 mmol/L (ref 22–32)
Calcium: 8.6 mg/dL — ABNORMAL LOW (ref 8.9–10.3)
Chloride: 102 mmol/L (ref 98–111)
Creatinine, Ser: 0.77 mg/dL (ref 0.44–1.00)
GFR calc Af Amer: >60 mL/min (ref >60)
GFR calc non Af Amer: >60 mL/min (ref >60)
Glucose, Bld: 111 mg/dL — ABNORMAL HIGH (ref 70–99)
Potassium: 3.6 mmol/L (ref 3.5–5.1)
Sodium: 140 mmol/L (ref 135–145)

## 2020-03-18 LAB — CBC WITH DIFFERENTIAL/PLATELET
Abs Immature Granulocytes: 0.11 10*3/uL — ABNORMAL HIGH (ref 0.00–0.07)
Basophils Absolute: 0 10*3/uL (ref 0.0–0.1)
Basophils Relative: 0 %
Eosinophils Absolute: 0.2 10*3/uL (ref 0.0–0.5)
Eosinophils Relative: 3 %
HCT: 26.5 % — ABNORMAL LOW (ref 36.0–46.0)
Hemoglobin: 7.6 g/dL — ABNORMAL LOW (ref 12.0–15.0)
Immature Granulocytes: 1 %
Lymphocytes Relative: 16 %
Lymphs Abs: 1.3 10*3/uL (ref 0.7–4.0)
MCH: 23.2 pg — ABNORMAL LOW (ref 26.0–34.0)
MCHC: 28.7 g/dL — ABNORMAL LOW (ref 30.0–36.0)
MCV: 81 fL (ref 80.0–100.0)
Monocytes Absolute: 0.5 10*3/uL (ref 0.1–1.0)
Monocytes Relative: 6 %
Neutro Abs: 5.9 10*3/uL (ref 1.7–7.7)
Neutrophils Relative %: 74 %
Platelets: 764 10*3/uL — ABNORMAL HIGH (ref 150–400)
RBC: 3.27 MIL/uL — ABNORMAL LOW (ref 3.87–5.11)
RDW: 23.1 % — ABNORMAL HIGH (ref 11.5–15.5)
WBC: 8 10*3/uL (ref 4.0–10.5)
nRBC: 0.6 % — ABNORMAL HIGH (ref 0.0–0.2)

## 2020-03-18 MED ORDER — TRAMADOL HCL 50 MG PO TABS
50.0000 mg | ORAL_TABLET | Freq: Three times a day (TID) | ORAL | Status: DC
  Administered 2020-03-18 (×2): 50 mg via ORAL
  Filled 2020-03-18 (×3): qty 1

## 2020-03-18 MED ORDER — ONDANSETRON HCL 4 MG/2ML IJ SOLN
4.0000 mg | Freq: Four times a day (QID) | INTRAMUSCULAR | Status: DC | PRN
  Administered 2020-03-18 – 2020-03-20 (×2): 4 mg via INTRAVENOUS
  Filled 2020-03-18 (×2): qty 2

## 2020-03-18 MED ORDER — GERHARDT'S BUTT CREAM
TOPICAL_CREAM | Freq: Four times a day (QID) | CUTANEOUS | Status: DC
  Filled 2020-03-18: qty 1

## 2020-03-18 MED ORDER — PANTOPRAZOLE SODIUM 40 MG IV SOLR
40.0000 mg | Freq: Two times a day (BID) | INTRAVENOUS | Status: DC
  Administered 2020-03-18 – 2020-03-20 (×5): 40 mg via INTRAVENOUS
  Filled 2020-03-18 (×6): qty 40

## 2020-03-18 MED ORDER — SODIUM CHLORIDE 0.9 % IV SOLN: INTRAVENOUS | Status: DC | PRN

## 2020-03-18 NOTE — Progress Notes (Signed)
Subjective: Generalized discomfort all over. No further bleeding. Having ongoing voluminous bowel movements per nursing.  Objective: Vital signs in last 24 hours: Temp:  [98 F (36.7 C)-98.1 F (36.7 C)] 98 F (36.7 C) (03/22 0607) Pulse Rate:  [75-79] 79 (03/22 0607) Resp:  [15-17] 16 (03/22 0607) BP: (90-136)/(58-74) 135/68 (03/22 1055) SpO2:  [95 %-100 %] 95 % (03/22 0607) Weight:  [49.3 kg] 49.3 kg (03/22 0607) Weight change: -2.722 kg Last BM Date: 03/18/20  PE: GEN:  Thin, chronically cachectic, but NAD ABD:  Soft, mild lower abdominal tenderness, non distended.  Lab Results: CBC    Component Value Date/Time   WBC 8.0 03/18/2020 0529   RBC 3.27 (L) 03/18/2020 0529   HGB 7.6 (L) 03/18/2020 0529   HCT 26.5 (L) 03/18/2020 0529   PLT 764 (H) 03/18/2020 0529   MCV 81.0 03/18/2020 0529   MCH 23.2 (L) 03/18/2020 0529   MCHC 28.7 (L) 03/18/2020 0529   RDW 23.1 (H) 03/18/2020 0529   LYMPHSABS 1.3 03/18/2020 0529   MONOABS 0.5 03/18/2020 0529   EOSABS 0.2 03/18/2020 0529   BASOSABS 0.0 03/18/2020 0529   CMP     Component Value Date/Time   NA 140 03/18/2020 0529   K 3.6 03/18/2020 0529   CL 102 03/18/2020 0529   CO2 29 03/18/2020 0529   GLUCOSE 111 (H) 03/18/2020 0529   BUN 16 03/18/2020 0529   CREATININE 0.77 03/18/2020 0529   CALCIUM 8.6 (L) 03/18/2020 0529   PROT 6.4 (L) 03/16/2020 1044   ALBUMIN 2.7 (L) 03/16/2020 1044   AST 16 03/16/2020 1044   ALT 20 03/16/2020 1044   ALKPHOS 83 03/16/2020 1044   BILITOT 0.6 03/16/2020 1044   GFRNONAA >60 03/18/2020 0529   GFRAA >60 03/18/2020 0529   Assessment:  1. Rectal pain.  Much improved. 2. Constipation with fecal impaction.  Much improved. 3. Blood in stool; stercoral ulcer? Stercoral proctitis? 4. Anemia, Hgb 4.2, improved after transfusion. 5. Profound thrombocytosis, well out of range expected for reactive changes to inflammation or iron deficiency. Concomitant bone marrow process? 6. COPD and  multiple comorbidities.  Plan:  1.  Continue Miralax tid today and tomorrow consider bowel prep, measures for a slow bowel prep process in anticipation of hopeful colonoscopy Wednesday. 2.  Serial CBCs, transfuse as needed, mobilize, OOBTC as possible. 3.  [facility] will follow.   Landry Dyke 03/18/2020, 11:36 AM   Cell [phone] If no answer or after 5 PM call [phone]

## 2020-03-18 NOTE — Progress Notes (Signed)
Subjective:   [name] continues to endorse generalized body aches and pain that she has battled for years. She reports that she has a history of osteoporosis and chronic pain for which she takes pain medications at home. She is tearful during our assessment. She reports of increased bowel movement.   Objective:  Vital signs in last 24 hours: Vitals:   03/17/20 1428 03/17/20 1429 03/17/20 2108 03/18/20 0607  BP: 90/74  (!) 120/58 136/61  Pulse:   75 79  Resp:  17 15 16   Temp:   98.1 F (36.7 C) 98 F (36.7 C)  TempSrc:      SpO2:  100% 100% 95%  Weight:    49.3 kg  Height:       Physical Exam Vitals and nursing note reviewed.  Constitutional:      General: She is not in acute distress.    Appearance: She is normal weight.  Cardiovascular:     Rate and Rhythm: Normal rate and regular rhythm.  Pulmonary:     Effort: Pulmonary effort is normal. No respiratory distress.  Abdominal:     General: Bowel sounds are normal. There is no distension.     Palpations: Abdomen is soft.     Tenderness: There is generalized abdominal tenderness.     Hernia: A hernia is present. Hernia is present in the umbilical area.  Skin:    General: Skin is warm and dry.     Coloration: Skin is pale.  Neurological:     General: No focal deficit present.     Mental Status: She is alert and oriented to person, place, and time.  Psychiatric:        Mood and Affect: Mood normal.        Behavior: Behavior normal.    Assessment/Plan:  Principal Problem:   Upper GI bleed Active Problems:   Hypertension   COPD (chronic obstructive pulmonary disease) (HCC)   Chronic respiratory failure (HCC)   Back pain   Symptomatic anemia   Supplemental oxygen dependent   Thrombocytosis (HCC)   Pressure injury of skin   Fecal impaction in rectum ([place])  [name] is a 84 y/o female with a PMHx of previous upper GI bleeds 2/2 acute enterogastritis, COPD on 2L home O2, CKD Stage 3, CVA, HTN currently  admitted for stercoral colitis, upper GI bleed complicated by acute blood loss anemia.   # Melena In the past, EGD has shown evidence of esophagitis with recent hemorrhagic changes, as well as gastritis with evidence of erythema and friability.  Both were felt to be pill induced, with aspirin being the culprit of the gastritis.  Her aspirin was held for 2 weeks, but then she was restarted.  Current episode may be secondary to continued use of aspirin.  However, differential also includes diverticulosis.  GI is planning for possible colonoscopy on Wednesday.  - GI following; we appreciate their recommendations - Pantoprazole 40 mg IV twice daily  # Stercoral Colitis No additional impaction.  Discontinue Zosyn at this time.  - Miralax TID   # Blood Loss Anemia # Iron Deficiency Anemia  Stable at this time.  We will continue to trend CBC often.  - CBC in the AM - Transfuse to maintain hemoglobin >7.0  # Thrombocytosis  Patient has a long-term history of chronic thrombocytosis, however her baseline seems to be between 500 k - 600 k.  On admission, platelet count was over 1000 k.  Although certainly iron deficiency  anemia blood loss anemia are contributing, will pursue work-up for possible essential thrombocytosis.  - Continue to trend platelet count - Monitor for signs of blood clots - Smear review pending - JAK2 genotype pending  # Chronic Back Pain  [name] is still experiencing significant back pain, although chronic is likely exacerbated by this hospitalization.  Scheduled Tylenol has not been adequate control pain, and with tramadol as as needed, she has only received 1 dose per day.  We will trial tramadol scheduled and reassess pain level in the morning.  - Tylenol 1 g TID  - Tramadol 50 mg TID   # COPD - Continue home dose supplemental oxygen @ 2L [place]  # CKD Stage 3 Creatinine back at baseline with GFR over 60.  Continue to monitor blood  # Hypertension -  Continue home Amlodipine 10 mg   Dispo: Anticipated discharge in approximately 3-4 day(s).   Dr. Jose Persia Internal Medicine PGY-1  Pager: [phone] 03/18/2020, 6:41 AM

## 2020-03-18 NOTE — Evaluation (Signed)
Clinical/Bedside Swallow Evaluation Patient Details  Name: [name] MRN: [id number] Date of Birth: 05/09/1936  Today's Date: 03/18/2020 Time: SLP Start Time (ACUTE ONLY): [id number] SLP Stop Time (ACUTE ONLY): 0921 SLP Time Calculation (min) (ACUTE ONLY): 26 min  Past Medical History:  Past Medical History:  Diagnosis Date  . Arthritis   . CAP (community acquired pneumonia)    multiple admissions  . Chronic back pain 08/22/2018  . Colitis 03/19/2014  . Compression fx, lumbar spine ([place])   . COPD (chronic obstructive pulmonary disease) ([place])   . Delirium   . Hypertension   . On home oxygen therapy 06-14-13   continuos 2.5l/m nasally-24/7  . Osteoporosis   . Sacral fracture ([place]) 2014   Past Surgical History:  Past Surgical History:  Procedure Laterality Date  . ABDOMINAL HYSTERECTOMY    . BIOPSY  05/24/2019   Procedure: BIOPSY;  Surgeon: Ronald Lobo, MD;  Location: [facility];  Service: Endoscopy;;  . ESOPHAGOGASTRODUODENOSCOPY N/A 06/15/2013   Procedure: ESOPHAGOGASTRODUODENOSCOPY (EGD);  Surgeon: [name], MD;  Location: [facility];  Service: Endoscopy;  Laterality: N/A;  . ESOPHAGOGASTRODUODENOSCOPY (EGD) WITH PROPOFOL N/A 05/24/2019   Procedure: ESOPHAGOGASTRODUODENOSCOPY (EGD) WITH PROPOFOL;  Surgeon: Ronald Lobo, MD;  Location: [facility];  Service: Endoscopy;  Laterality: N/A;  . IR GENERIC HISTORICAL  10/15/2016   IR KYPHO LUMBAR INC FX REDUCE BONE BX UNI/BIL CANNULATION INC/IMAGING 10/15/2016 Luanne Bras, MD [facility]  . sacroplasty  06-14-13   05-10-13 IVR [facility] for fracture stabilization   HPI:  [name] is a 84 y/o female with a PMHx of previous upper GI bleeds 2/2 acute enterogastritis, COPD on 2L home O2, CKD Stage 3, CVA with L. Sided weakness, HTN who presents to the ED with c/o constipation.  Pt was seen for a BSE on 05/05/2019 with recommendations for Dysphagia 3 (soft) solids and thin liquids.     Assessment / Plan /  Recommendation Clinical Impression  Pt was seen for a bedside swallow evaluation and she presents with oral dysphagia secondary to dentition.  Suspect functional pharyngeal phase of the swallow.  Pt reported that she was tolerating soft solids and thin liquids fairly well, but stated that she had a "choking event" this morning.  She additionally stated that she felt like there was mucus in her throat that she was unable to clear.  Pt reported a hx of GERD and stated that she takes medication to manage it.  Oral mechanism exam revealed missing dentition on top and a blue/black spot on the pt's L anterior lingual tip.  Pt stated that she thought that her tongue had become caught in between her teeth the other day and stated that she may have bruised it.  RN was made aware.  She consumed trials of ice chips, thin liquid, puree, and regular solids.  She exhibited prolonged mastication of regular solids and she was observed to talk during mastication.  Mastication was effective and no oral residue was observed following swallow initiation.  No overt s/sx of aspiration were observed with any PO trials during this evaluation.  Recommend Dysphagia 3 (mech soft) solids and thin liquid with medications administered whole with liquid or in puree (per pt preference) with adherence to the following compensatory strategies: 1) Small bites/sips 2) Slow rate of intake 3) Sit upright as possible 4) Remain upright for 20-30 minutes after a meal 5) Limit distractions/Do not talk during PO intake.  SLP educated pt regarding recommendations and she verbalized understanding.  SLP will briefly f/u to monitor diet tolerance.    SLP Visit Diagnosis: Dysphagia, oral phase (R13.11)    Aspiration Risk  Mild aspiration risk    Diet Recommendation Dysphagia 3 (Mech soft);Thin liquid   Liquid Administration via: Cup;Straw Medication Administration: Whole meds with liquid Supervision: Patient able to self feed;Intermittent supervision  to cue for compensatory strategies Compensations: Minimize environmental distractions;Slow rate;Small sips/bites Postural Changes: Seated upright at 90 degrees;Remain upright for at least 30 minutes after po intake    Other  Recommendations Oral Care Recommendations: Oral care BID   Follow up Recommendations None      Frequency and Duration min 1 x/week  2 weeks       Prognosis Prognosis for Safe Diet Advancement: Good      Swallow Study   General Date of Onset: 03/16/20 HPI: [name] is a 84 y/o female with a PMHx of previous upper GI bleeds 2/2 acute enterogastritis, COPD on 2L home O2, CKD Stage 3, CVA with L. Sided weakness, HTN who presents to the ED with c/o constipation.  Pt was seen for a BSE on 05/05/2019 with recommendations for Dyspahgia 3 (soft) solids and thin liquids.   Type of Study: Bedside Swallow Evaluation Previous Swallow Assessment: See HPI Diet Prior to this Study: Dysphagia 3 (soft);Thin liquids Temperature Spikes Noted: No Respiratory Status: Nasal cannula History of Recent Intubation: No Behavior/Cognition: Alert;Cooperative;Pleasant mood Oral Cavity Assessment: Within Functional Limits Oral Care Completed by SLP: No Oral Cavity - Dentition: Missing dentition Vision: Functional for self-feeding Self-Feeding Abilities: Able to feed self;Needs set up Patient Positioning: Upright in bed Baseline Vocal Quality: Normal Volitional Cough: Weak Volitional Swallow: Able to elicit    Oral/Motor/Sensory Function Overall Oral Motor/Sensory Function: Within functional limits   Ice Chips Ice chips: Within functional limits Presentation: Spoon   Thin Liquid Thin Liquid: Within functional limits Presentation: Spoon;Straw;Cup    Nectar Thick Nectar Thick Liquid: Not tested   Honey Thick Honey Thick Liquid: Not tested   Puree Puree: Within functional limits Presentation: Spoon   Solid     Solid: Impaired Presentation: Self Fed Oral Phase Functional  Implications: Prolonged oral transit     Colin Mulders M.S., CCC-SLP Acute Rehabilitation Services Office: [phone]  [place] 03/18/2020,9:35 AM

## 2020-03-19 LAB — CBC WITH DIFFERENTIAL/PLATELET
Abs Immature Granulocytes: 0.09 10*3/uL — ABNORMAL HIGH (ref 0.00–0.07)
Basophils Absolute: 0 10*3/uL (ref 0.0–0.1)
Basophils Relative: 0 %
Eosinophils Absolute: 0.3 10*3/uL (ref 0.0–0.5)
Eosinophils Relative: 3 %
HCT: 25 % — ABNORMAL LOW (ref 36.0–46.0)
Hemoglobin: 7 g/dL — ABNORMAL LOW (ref 12.0–15.0)
Immature Granulocytes: 1 %
Lymphocytes Relative: 13 %
Lymphs Abs: 1.1 10*3/uL (ref 0.7–4.0)
MCH: 23 pg — ABNORMAL LOW (ref 26.0–34.0)
MCHC: 28 g/dL — ABNORMAL LOW (ref 30.0–36.0)
MCV: 82.2 fL (ref 80.0–100.0)
Monocytes Absolute: 0.7 10*3/uL (ref 0.1–1.0)
Monocytes Relative: 7 %
Neutro Abs: 6.8 10*3/uL (ref 1.7–7.7)
Neutrophils Relative %: 76 %
Platelets: 741 10*3/uL — ABNORMAL HIGH (ref 150–400)
RBC: 3.04 MIL/uL — ABNORMAL LOW (ref 3.87–5.11)
RDW: 23.9 % — ABNORMAL HIGH (ref 11.5–15.5)
WBC: 9 10*3/uL (ref 4.0–10.5)
nRBC: 0.6 % — ABNORMAL HIGH (ref 0.0–0.2)

## 2020-03-19 LAB — BASIC METABOLIC PANEL
Anion gap: 8 (ref 5–15)
BUN: 15 mg/dL (ref 8–23)
CO2: 28 mmol/L (ref 22–32)
Calcium: 8 mg/dL — ABNORMAL LOW (ref 8.9–10.3)
Chloride: 104 mmol/L (ref 98–111)
Creatinine, Ser: 0.78 mg/dL (ref 0.44–1.00)
GFR calc Af Amer: >60 mL/min (ref >60)
GFR calc non Af Amer: >60 mL/min (ref >60)
Glucose, Bld: 161 mg/dL — ABNORMAL HIGH (ref 70–99)
Potassium: 3.3 mmol/L — ABNORMAL LOW (ref 3.5–5.1)
Sodium: 140 mmol/L (ref 135–145)

## 2020-03-19 LAB — HEMOGLOBIN AND HEMATOCRIT, BLOOD
HCT: 28.6 % — ABNORMAL LOW (ref 36.0–46.0)
Hemoglobin: 8.2 g/dL — ABNORMAL LOW (ref 12.0–15.0)

## 2020-03-19 LAB — PATHOLOGIST SMEAR REVIEW

## 2020-03-19 LAB — PREPARE RBC (CROSSMATCH)

## 2020-03-19 MED ORDER — HYDROCODONE-ACETAMINOPHEN 5-325 MG PO TABS
1.0000 | ORAL_TABLET | Freq: Four times a day (QID) | ORAL | Status: DC | PRN
  Administered 2020-03-19: 13:00:00 1 via ORAL
  Filled 2020-03-19: qty 1

## 2020-03-19 MED ORDER — SODIUM CHLORIDE 0.9% IV SOLUTION: Freq: Once | INTRAVENOUS | Status: DC

## 2020-03-19 MED ORDER — PEG 3350-KCL-NA BICARB-NACL 420 G PO SOLR
1500.0000 mL | Freq: Once | ORAL | Status: AC
  Administered 2020-03-20: 01:00:00 1500 mL via ORAL
  Filled 2020-03-19: qty 4000

## 2020-03-19 MED ORDER — HYDROCODONE-ACETAMINOPHEN 5-325 MG PO TABS
1.0000 | ORAL_TABLET | Freq: Four times a day (QID) | ORAL | Status: DC
  Administered 2020-03-19 – 2020-03-20 (×5): 1 via ORAL
  Filled 2020-03-19 (×5): qty 1

## 2020-03-19 MED ORDER — ACETAMINOPHEN 325 MG PO TABS
650.0000 mg | ORAL_TABLET | Freq: Three times a day (TID) | ORAL | Status: DC
  Administered 2020-03-19 – 2020-03-20 (×3): 650 mg via ORAL
  Filled 2020-03-19 (×3): qty 2

## 2020-03-19 MED ORDER — POTASSIUM CHLORIDE CRYS ER 20 MEQ PO TBCR
40.0000 meq | EXTENDED_RELEASE_TABLET | Freq: Once | ORAL | Status: AC
  Administered 2020-03-19: 09:00:00 40 meq via ORAL
  Filled 2020-03-19: qty 2

## 2020-03-19 MED ORDER — PEG 3350-KCL-NA BICARB-NACL 420 G PO SOLR
2500.0000 mL | Freq: Once | ORAL | Status: AC
  Administered 2020-03-19: 18:00:00 2500 mL via ORAL
  Filled 2020-03-19: qty 4000

## 2020-03-19 MED ORDER — PEG 3350-KCL-NA BICARB-NACL 420 G PO SOLR: 4000.0000 mL | Freq: Once | ORAL | Status: DC

## 2020-03-19 MED ORDER — SODIUM CHLORIDE 0.9 % IV SOLN: INTRAVENOUS | Status: DC

## 2020-03-19 NOTE — Progress Notes (Signed)
Subjective:   Patient states that she is still in a lot of pain in her back. She states that she has had multiple BM yesterday and is having some irritation around her rectum due to her frequent BM. She states that this is very uncomfortable. She is very concerned about her many medical conditions, including fear of not walking. She states that she uses a walker to get around at home. She is comforted by hearing from her family and wants [place] to call her Son to inform him of her progress. She states that he stomach pain has resolved. All questions were thoroughly answered.  Objective:  Vital signs in last 24 hours: Vitals:   03/18/20 1700 03/18/20 2049 03/19/20 0121 03/19/20 0345  BP: 140/65  (!) 115/50 133/68  Pulse:   79 90  Resp:   15 19  Temp: 98.1 F (36.7 C)  98.1 F (36.7 C) 98.1 F (36.7 C)  TempSrc: Oral  Oral Oral  SpO2:  100% 98% 95%  Weight:    50.8 kg  Height:        Physical Exam Vitals and nursing note reviewed.  Constitutional:      General: She is not in acute distress.    Appearance: She is normal weight.  Cardiovascular:     Rate and Rhythm: Normal rate and regular rhythm.     Heart sounds: No murmur.  Abdominal:     General: Bowel sounds are normal.     Palpations: Abdomen is soft.     Tenderness: There is no abdominal tenderness. There is no guarding.     Hernia: A hernia is present. Hernia is present in the umbilical area.  Skin:    General: Skin is warm and dry.  Neurological:     General: No focal deficit present.     Mental Status: She is alert and oriented to person, place, and time.  Psychiatric:        Mood and Affect: Mood is anxious.        Behavior: Behavior normal.    Assessment/Plan:  Principal Problem:   Upper GI bleed Active Problems:   Hypertension   COPD (chronic obstructive pulmonary disease) (HCC)   Chronic respiratory failure (HCC)   Back pain   Symptomatic anemia   Supplemental oxygen dependent   Thrombocytosis (HCC)   Pressure injury of skin   Fecal impaction in rectum [facility])  [name] is a 84 y/o female with a PMHx of previous upper GI bleeds 2/2 acute enterogastritis, COPD on 2L home O2, CKD Stage 3, CVA, HTN currently admitted for stercoral colitis, upper GI bleed complicated by acute blood loss anemia.  # Melena Differential includes upper GI bleed from esophagitis/gastritis, as she's had in the past, versus colonic bleed including diverticulosis.  GI is planning for possible colonoscopy on Wednesday or Thursday.   -GI following; we appreciate their recommendations - Pantoprazole 40 mg IV twice daily - Switch to clear liquid diet in anticipation of starting bowel prep for colonoscopy   # Stercoral Colitis Resolving with improving rectal pain.   - Miralax TID   # Blood Loss Anemia # Iron Deficiency Anemia  Patient's hemoglobin decreased to 7.0 this AM, 1 unit of pRBCs ordered. Unlikely to be a brisk bleed but just continued slow blood loss from unidentified GI source at this time.   - s/p 1 unit of pRBC overnight - post-transfusion CBC pending  - Transfuse to maintain hemoglobin >7.0  # Thrombocytosis - Continue  to trend platelet count - Monitor for signs of blood clots - Smearreviewpending - JAK2 genotype pending  # Chronic Back Pain  Continues to be uncontrolled today. Will trial hydrocodone and discontinue Tramadol.   - Tylenol 625 g TID  - Norco 5-325 mg QID q6h PRN    # COPD - Continue home dose supplemental oxygen @ 2L [place]  # CKD Stage 3 Creatinine back at baseline with GFR over 60.  Continue to monitor BMP  # Hypertension - Continue home Amlodipine 10 mg   Dispo: Anticipated discharge in approximately 2-3 day(s).   Dr. Jose Persia Internal Medicine PGY-1  Pager: [phone] 03/19/2020, 7:34 AM

## 2020-03-19 NOTE — H&P (View-Only) (Signed)
Subjective: Ongoing bowel movements. No blood in stool.  Objective: Vital signs in last 24 hours: Temp:  [97.9 F (36.6 C)-98.5 F (36.9 C)] 98.1 F (36.7 C) (03/23 1125) Pulse Rate:  [79-90] 87 (03/23 1125) Resp:  [13-19] 15 (03/23 1125) BP: (115-151)/(50-69) 141/56 (03/23 1125) SpO2:  [95 %-100 %] 100 % (03/23 1125) Weight:  [50.8 kg] 50.8 kg (03/23 0345) Weight change: 1.497 kg Last BM Date: 03/19/20  PE: GEN:  NAD ABD:  Soft, non-tender  Lab Results: CBC    Component Value Date/Time   WBC 9.0 03/19/2020 0348   RBC 3.04 (L) 03/19/2020 0348   HGB 7.0 (L) 03/19/2020 0348   HCT 25.0 (L) 03/19/2020 0348   PLT 741 (H) 03/19/2020 0348   MCV 82.2 03/19/2020 0348   MCH 23.0 (L) 03/19/2020 0348   MCHC 28.0 (L) 03/19/2020 0348   RDW 23.9 (H) 03/19/2020 0348   LYMPHSABS 1.1 03/19/2020 0348   MONOABS 0.7 03/19/2020 0348   EOSABS 0.3 03/19/2020 0348   BASOSABS 0.0 03/19/2020 0348   CMP     Component Value Date/Time   NA 140 03/19/2020 0348   K 3.3 (L) 03/19/2020 0348   CL 104 03/19/2020 0348   CO2 28 03/19/2020 0348   GLUCOSE 161 (H) 03/19/2020 0348   BUN 15 03/19/2020 0348   CREATININE 0.78 03/19/2020 0348   CALCIUM 8.0 (L) 03/19/2020 0348   PROT 6.4 (L) 03/16/2020 1044   ALBUMIN 2.7 (L) 03/16/2020 1044   AST 16 03/16/2020 1044   ALT 20 03/16/2020 1044   ALKPHOS 83 03/16/2020 1044   BILITOT 0.6 03/16/2020 1044   GFRNONAA >60 03/19/2020 0348   GFRAA >60 03/19/2020 0348   Assessment:  1. Rectal pain.Much improved. 2. Constipation with fecal impaction.Much improved. 3. Blood in stool; stercoral ulcer? Stercoral proctitis? 4. Anemia, Hgb 4.2, improved after transfusion.  Slow down drift, receiving more blood today. 5. Thrombocytosis, improving,well out of range expected for reactive changes to inflammation or iron deficiency. Bone marrow process? 6. COPD and multiple comorbidities.  Plan:  1.  Clear liquid diet. 2.  Golytely prep today. 3.   Colonoscopy tomorrow. 4.  [facility] will follow.   Landry Dyke 03/19/2020, 12:00 PM   Cell [phone] If no answer or after 5 PM call [phone]

## 2020-03-19 NOTE — Plan of Care (Signed)
  Problem: Activity: Goal: Risk for activity intolerance will decrease Outcome: Progressing   Problem: Coping: Goal: Level of anxiety will decrease Outcome: Progressing   

## 2020-03-19 NOTE — Evaluation (Signed)
Physical Therapy Evaluation Patient Details Name: [name] MRN: [id number] DOB: 1936-09-17 Today's Date: 03/19/2020   History of Present Illness  84 y/o female with a PMHx of previous upper GI bleeds 2/2 acute enterogastritis, COPD on 2L home O2, CKD Stage 3, CVA with L. Sided weakness, HTN who presents to the ED with c/o constipation. Pt admitted w/ GI bleeding, colitis, acute blood loss anemia.  Anemia responded well to blood transfusion, noted iprovement in fecal impaction with a SMOG enema. Pt placed on Zosyn for the colitis.   Clinical Impression   Pt admitted with above diagnosis. PTA inconclusive from pt reporting, At times states she is able to complete some ADLs and get to [facility] on her own and walk in home with walker, other times states that she just laying in bed all day (because she wants to) and her son [name] assists with all ADLs and IADLs. Pt currently with functional limitations due to the deficits listed below (see PT Problem List). This am pt needed mod-max a with functional mobility, was able to get from supine to sit edge of bed, sat edge of bed supported and unsupported approx 19mins, attempted sit<> stand with walker x 3 and each time pt c/o feeling dizzy and needing to sit and rest. Pt will benefit from skilled PT to increase their independence and safety with mobility to allow discharge to the venue listed below. PT states she had 6 weeks of home health and she was sick of it, she had her son to do everything for her and they are both happy with this arrangement. Pt would greatly benefit from post acute care therapy to increase strength, balance and coordination also activity tolerance and independence. Therapist attempted to educate pt on this and role of therapy in recovery after illness and hospitalization such as this but no evidence of learning evident.      Follow Up Recommendations Supervision/Assistance - 24 hour;SNF    Equipment Recommendations  None recommended by  PT    Recommendations for Other Services       Precautions / Restrictions Precautions Precautions: Fall Precaution Comments: cognition Restrictions Weight Bearing Restrictions: No      Mobility  Bed Mobility Overal bed mobility: Needs Assistance Bed Mobility: Supine to Sit;Sit to Supine     Supine to sit: Mod assist Sit to supine: Mod assist      Transfers Overall transfer level: Needs assistance   Transfers: Sit to/from Stand Sit to Stand: Mod assist;Max assist         General transfer comment: attempted to stand x 3 but each time pt c/o feeling dizzy and needing to sit down to recover  Ambulation/Gait             General Gait Details: did not attempt ambulation as pt c/o dizziness with each attempted standing  Stairs            Wheelchair Mobility    Modified Rankin (Stroke Patients Only)       Balance Overall balance assessment: Needs assistance Sitting-balance support: Feet supported;Bilateral upper extremity supported Sitting balance-Leahy Scale: Fair Sitting balance - Comments: able to sit edge of bed with support   Standing balance support: During functional activity;Bilateral upper extremity supported Standing balance-Leahy Scale: Poor Standing balance comment: reliant on UE support and also extrenal support to maintain  Pertinent Vitals/Pain Pain Assessment: Faces Faces Pain Scale: Hurts little more Pain Location: w/ gen mobility Pain Descriptors / Indicators: Discomfort Pain Intervention(s): Limited activity within patient's tolerance    Home Living Family/patient expects to be discharged to:: Private residence Living Arrangements: Children Available Help at Discharge: Family;Available 24 hours/day Type of Home: House Home Access: Stairs to enter Entrance Stairs-Rails: Right Entrance Stairs-Number of Steps: 2 Home Layout: One level Home Equipment: Walker - 2 wheels;Cane - single  point;Bedside commode;Walker - 4 wheels;Wheelchair - manual;Hospital bed Additional Comments: son MIke has been completing all tasks pt states that she just lays in bed all day, at other times she states she is able to get to [facility] on her own but hx is all convoluted and confused    Prior Function Level of Independence: Needs assistance   Gait / Transfers Assistance Needed: states was able to ambulate with walker but then states that she stays in bed all day  ADL's / Homemaking Assistance Needed: states [name] does everything for her  Comments: pt seems to be confused as to what PLOF was and [name] is not in room to assist with hx     Hand Dominance   Dominant Hand: Right    Extremity/Trunk Assessment   Upper Extremity Assessment Upper Extremity Assessment: Generalized weakness    Lower Extremity Assessment Lower Extremity Assessment: Generalized weakness    Cervical / Trunk Assessment Cervical / Trunk Assessment: Kyphotic  Communication   Communication: HOH;Expressive difficulties;Receptive difficulties(seems to be confused)  Cognition Arousal/Alertness: Awake/alert Behavior During Therapy: Agitated;Anxious;Restless Overall Cognitive Status: No family/caregiver present to determine baseline cognitive functioning                                 General Comments: seems to be very confused gives long convoluted hx where she states she is either totally independent with her walker or is totally dependent on her son [name]. Therapist attempted to orient and educate on role pf therapist in acute setting but no evidence of learning noted      General Comments      Exercises     Assessment/Plan    PT Assessment Patient needs continued PT services  PT Problem List Decreased strength;Decreased activity tolerance;Decreased balance;Decreased mobility;Decreased coordination;Decreased cognition;Decreased safety awareness;Decreased knowledge of use of DME       PT Treatment  Interventions DME instruction;Gait training;Functional mobility training;Therapeutic activities;Therapeutic exercise;Neuromuscular re-education;Balance training;Cognitive remediation;Patient/family education    PT Goals (Current goals can be found in the Care Plan section)  Acute Rehab PT Goals Patient Stated Goal: states is at where she wants to be and son will do all ADLs etc for her PT Goal Formulation: With patient Time For Goal Achievement: 04/02/20 Potential to Achieve Goals: Fair    Frequency Min 2X/week   Barriers to discharge        Co-evaluation               AM-PAC PT "6 Clicks" Mobility  Outcome Measure Help needed turning from your back to your side while in a flat bed without using bedrails?: A Lot Help needed moving from lying on your back to sitting on the side of a flat bed without using bedrails?: A Lot Help needed moving to and from a bed to a chair (including a wheelchair)?: A Lot Help needed standing up from a chair using your arms (e.g., wheelchair or bedside chair)?: A Lot Help needed to walk  in hospital room?: Total Help needed climbing 3-5 steps with a railing? : Total 6 Click Score: 10    End of Session Equipment Utilized During Treatment: Gait belt;Oxygen Activity Tolerance: Patient limited by fatigue;Patient limited by lethargy;Patient limited by pain;Treatment limited secondary to agitation Patient left: in bed;with call bell/phone within reach   PT Visit Diagnosis: Other abnormalities of gait and mobility (R26.89);Unsteadiness on feet (R26.81);Muscle weakness (generalized) (M62.81)    Time: 1220-1300 PT Time Calculation (min) (ACUTE ONLY): 40 min   Charges:   PT Evaluation $PT Eval Moderate Complexity: 1 Mod PT Treatments $Therapeutic Activity: 23-37 mins        Horald Chestnut, PT   Delford Field 03/19/2020, 1:12 PM

## 2020-03-19 NOTE — Progress Notes (Signed)
Subjective: Ongoing bowel movements. No blood in stool.  Objective: Vital signs in last 24 hours: Temp:  [97.9 F (36.6 C)-98.5 F (36.9 C)] 98.1 F (36.7 C) (03/23 1125) Pulse Rate:  [79-90] 87 (03/23 1125) Resp:  [13-19] 15 (03/23 1125) BP: (115-151)/(50-69) 141/56 (03/23 1125) SpO2:  [95 %-100 %] 100 % (03/23 1125) Weight:  [50.8 kg] 50.8 kg (03/23 0345) Weight change: 1.497 kg Last BM Date: 03/19/20  PE: GEN:  NAD ABD:  Soft, non-tender  Lab Results: CBC    Component Value Date/Time   WBC 9.0 03/19/2020 0348   RBC 3.04 (L) 03/19/2020 0348   HGB 7.0 (L) 03/19/2020 0348   HCT 25.0 (L) 03/19/2020 0348   PLT 741 (H) 03/19/2020 0348   MCV 82.2 03/19/2020 0348   MCH 23.0 (L) 03/19/2020 0348   MCHC 28.0 (L) 03/19/2020 0348   RDW 23.9 (H) 03/19/2020 0348   LYMPHSABS 1.1 03/19/2020 0348   MONOABS 0.7 03/19/2020 0348   EOSABS 0.3 03/19/2020 0348   BASOSABS 0.0 03/19/2020 0348   CMP     Component Value Date/Time   NA 140 03/19/2020 0348   K 3.3 (L) 03/19/2020 0348   CL 104 03/19/2020 0348   CO2 28 03/19/2020 0348   GLUCOSE 161 (H) 03/19/2020 0348   BUN 15 03/19/2020 0348   CREATININE 0.78 03/19/2020 0348   CALCIUM 8.0 (L) 03/19/2020 0348   PROT 6.4 (L) 03/16/2020 1044   ALBUMIN 2.7 (L) 03/16/2020 1044   AST 16 03/16/2020 1044   ALT 20 03/16/2020 1044   ALKPHOS 83 03/16/2020 1044   BILITOT 0.6 03/16/2020 1044   GFRNONAA >60 03/19/2020 0348   GFRAA >60 03/19/2020 0348   Assessment:  1. Rectal pain.Much improved. 2. Constipation with fecal impaction.Much improved. 3. Blood in stool; stercoral ulcer? Stercoral proctitis? 4. Anemia, Hgb 4.2, improved after transfusion.  Slow down drift, receiving more blood today. 5. Thrombocytosis, improving,well out of range expected for reactive changes to inflammation or iron deficiency. Bone marrow process? 6. COPD and multiple comorbidities.  Plan:  1.  Clear liquid diet. 2.  Golytely prep today. 3.   Colonoscopy tomorrow. 4.  [facility] will follow.   Landry Dyke 03/19/2020, 12:00 PM   Cell [phone] If no answer or after 5 PM call [phone]

## 2020-03-19 NOTE — Progress Notes (Signed)
CRITICAL VALUE ALERT  Critical Value:  Hgb 7.0  Date & Time Notied:  3/23 at 0626  Provider Notified: IM MD on call  Orders Received/Actions taken: pending

## 2020-03-20 ENCOUNTER — Encounter (HOSPITAL_COMMUNITY)
Admission: EM | Disposition: A | Discharge status: Home / Self Care | Payer: Self-pay | Source: Home / Self Care | Attending: Student in an Organized Health Care Education/Training Program

## 2020-03-20 ENCOUNTER — Inpatient Hospital Stay (HOSPITAL_COMMUNITY): Payer: Medicare Other | Admitting: Certified Registered"

## 2020-03-20 ENCOUNTER — Encounter (HOSPITAL_COMMUNITY): Payer: Self-pay | Admitting: Internal Medicine

## 2020-03-20 DIAGNOSIS — K648 Other hemorrhoids: Secondary | ICD-10-CM

## 2020-03-20 DIAGNOSIS — K5710 Diverticulosis of small intestine without perforation or abscess without bleeding: Secondary | ICD-10-CM

## 2020-03-20 DIAGNOSIS — Z885 Allergy status to narcotic agent status: Secondary | ICD-10-CM

## 2020-03-20 DIAGNOSIS — Z888 Allergy status to other drugs, medicaments and biological substances status: Secondary | ICD-10-CM

## 2020-03-20 DIAGNOSIS — D126 Benign neoplasm of colon, unspecified: Secondary | ICD-10-CM

## 2020-03-20 HISTORY — PX: COLONOSCOPY WITH PROPOFOL: SHX5780

## 2020-03-20 HISTORY — PX: POLYPECTOMY: SHX5525

## 2020-03-20 LAB — BPAM RBC
Blood Product Expiration Date: 202103242359
Blood Product Expiration Date: 202104202359
Blood Product Expiration Date: 202104222359
ISSUE DATE / TIME: 202103201205
ISSUE DATE / TIME: 202103201450
ISSUE DATE / TIME: 202103230831
Unit Type and Rh: 5100
Unit Type and Rh: 5100
Unit Type and Rh: 9500

## 2020-03-20 LAB — TYPE AND SCREEN
ABO/RH(D): O POS
Antibody Screen: NEGATIVE
Unit division: 0
Unit division: 0
Unit division: 0

## 2020-03-20 LAB — CBC WITH DIFFERENTIAL/PLATELET
Abs Immature Granulocytes: 0.08 10*3/uL — ABNORMAL HIGH (ref 0.00–0.07)
Basophils Absolute: 0 10*3/uL (ref 0.0–0.1)
Basophils Relative: 0 %
Eosinophils Absolute: 0.3 10*3/uL (ref 0.0–0.5)
Eosinophils Relative: 4 %
HCT: 30.1 % — ABNORMAL LOW (ref 36.0–46.0)
Hemoglobin: 8.8 g/dL — ABNORMAL LOW (ref 12.0–15.0)
Immature Granulocytes: 1 %
Lymphocytes Relative: 17 %
Lymphs Abs: 1.2 10*3/uL (ref 0.7–4.0)
MCH: 24.8 pg — ABNORMAL LOW (ref 26.0–34.0)
MCHC: 29.2 g/dL — ABNORMAL LOW (ref 30.0–36.0)
MCV: 84.8 fL (ref 80.0–100.0)
Monocytes Absolute: 0.8 10*3/uL (ref 0.1–1.0)
Monocytes Relative: 11 %
Neutro Abs: 4.8 10*3/uL (ref 1.7–7.7)
Neutrophils Relative %: 67 %
Platelets: 696 10*3/uL — ABNORMAL HIGH (ref 150–400)
RBC: 3.55 MIL/uL — ABNORMAL LOW (ref 3.87–5.11)
RDW: 23.4 % — ABNORMAL HIGH (ref 11.5–15.5)
WBC: 7.2 10*3/uL (ref 4.0–10.5)
nRBC: 0 % (ref 0.0–0.2)

## 2020-03-20 LAB — BASIC METABOLIC PANEL
Anion gap: 10 (ref 5–15)
BUN: 8 mg/dL (ref 8–23)
CO2: 28 mmol/L (ref 22–32)
Calcium: 8.5 mg/dL — ABNORMAL LOW (ref 8.9–10.3)
Chloride: 102 mmol/L (ref 98–111)
Creatinine, Ser: 0.57 mg/dL (ref 0.44–1.00)
GFR calc Af Amer: >60 mL/min (ref >60)
GFR calc non Af Amer: >60 mL/min (ref >60)
Glucose, Bld: 86 mg/dL (ref 70–99)
Potassium: 4.1 mmol/L (ref 3.5–5.1)
Sodium: 140 mmol/L (ref 135–145)

## 2020-03-20 SURGERY — COLONOSCOPY WITH PROPOFOL
Anesthesia: Monitor Anesthesia Care | Laterality: Left

## 2020-03-20 MED ORDER — LACTATED RINGERS IV SOLN: INTRAVENOUS | Status: DC | PRN

## 2020-03-20 MED ORDER — POLYETHYLENE GLYCOL 3350 17 G PO PACK: 17.0000 g | PACK | Freq: Every day | ORAL | 0 refills | Status: DC

## 2020-03-20 MED ORDER — POLYETHYLENE GLYCOL 3350 17 G PO PACK: 17.0000 g | PACK | Freq: Every day | ORAL | Status: DC | PRN

## 2020-03-20 MED ORDER — PHENYLEPHRINE HCL-NACL 10-0.9 MG/250ML-% IV SOLN
INTRAVENOUS | Status: DC | PRN
  Administered 2020-03-20: 50 ug/min via INTRAVENOUS

## 2020-03-20 MED ORDER — PROPOFOL 10 MG/ML IV BOLUS
INTRAVENOUS | Status: DC | PRN
  Administered 2020-03-20: 20 mg via INTRAVENOUS
  Administered 2020-03-20: 30 mg via INTRAVENOUS

## 2020-03-20 MED ORDER — PROPOFOL 500 MG/50ML IV EMUL
INTRAVENOUS | Status: DC | PRN
  Administered 2020-03-20: 125 ug/kg/min via INTRAVENOUS

## 2020-03-20 SURGICAL SUPPLY — 22 items

## 2020-03-20 NOTE — TOC Transition Note (Signed)
Transition of Care [facility]) - CM/SW Discharge Note   Patient Details  Name: [name] MRN: [id number] Date of Birth: 1936/01/08  Transition of Care [facility]) CM/SW Contact:  Trula Ore, [place] Phone Number: 03/20/2020, 3:31 PM   Clinical Narrative:     Patient will DC to: home  Anticipated DC date: 03/20/2020  Family notified: Son [name] notified by patient  Transport by: Corey Harold  ?  Per MD patient ready for DC to home . RN, patient, patient's family, and facility notified of DC. DC packet on chart. Ambulance transport requested for patient.  CSW signing off.    Final next level of care: Home/Self Care Barriers to Discharge: No Barriers Identified   Patient Goals and CMS Choice Patient states their goals for this hospitalization and ongoing recovery are:: to go home with help from son CMS Medicare.gov Compare Post Acute Care list provided to:: Patient Choice offered to / list presented to : Patient  Discharge Placement                Patient to be transferred to facility by: [facility]   Patient and family notified of of transfer: 03/20/20  Discharge Plan and Services                                     Social Determinants of Health (SDOH) Interventions     Readmission Risk Interventions Readmission Risk Prevention Plan 06/20/2019 05/01/2019 08/22/2018  Post Dischage Appt - - Patient refused  Medication Screening - - Complete  Transportation Screening Complete Complete Complete  PCP follow-up - - Patient refused  PCP or Specialist Appt within 5-7 Days Complete - -  Home Care Screening Complete - -  Medication Review (RN CM) Complete - -  Medication Review (RN Transport planner) - Referral to Pharmacy -  Some recent data might be hidden

## 2020-03-20 NOTE — Progress Notes (Signed)
RN called pt's son [name]) to inform his of discharge instructions and inform him of discharge planning. Son has no further questions at this time.

## 2020-03-20 NOTE — Op Note (Signed)
[facility] Patient Name: [name] Procedure Date : 03/20/2020 MRN: [id number] Attending MD: [name] , MD Date of Birth: 03-Mar-1936 CSN: [id number] Age: 84 Admit Type: Inpatient Procedure:                Colonoscopy Indications:              Evaluation of unexplained GI bleeding presenting                            with Hematochezia, This is the patient's first                            colonoscopy, Hematochezia Providers:                [name], MD, Benetta Spar RN, RN,                            Cherylynn Ridges, Technician Referring MD:             hospital team Medicines:                Propofol per Anesthesia, Monitored Anesthesia Care Complications:            No immediate complications. Estimated Blood Loss:     Estimated blood loss: none. Procedure:                Pre-Anesthesia Assessment:                           - Prior to the procedure, a History and Physical                            was performed, and patient medications and                            allergies were reviewed. The patient's tolerance of                            previous anesthesia was also reviewed. The risks                            and benefits of the procedure and the sedation                            options and risks were discussed with the patient.                            All questions were answered, and informed consent                            was obtained. Prior Anticoagulants: The patient has                            taken no previous anticoagulant or antiplatelet  agents. ASA Grade Assessment: III - A patient with                            severe systemic disease. After reviewing the risks                            and benefits, the patient was deemed in                            satisfactory condition to undergo the procedure.                           After obtaining informed consent, the colonoscope                      was passed under direct vision. Throughout the                            procedure, the patient's blood pressure, pulse, and                            oxygen saturations were monitored continuously. The                            [id number]) Olympus pediatric colonscope                            was introduced through the anus and advanced to the                            the cecum, identified by appendiceal orifice and                            ileocecal valve. The colonoscopy was performed                            without difficulty. The patient tolerated the                            procedure well. The quality of the bowel                            preparation was adequate and good. The terminal                            ileum, ileocecal valve, appendiceal orifice, and                            rectum were photographed. Scope In: 10:54:46 AM Scope Out: 11:10:57 AM Scope Withdrawal Time: 0 hours 13 minutes 2 seconds  Total Procedure Duration: 0 hours 16 minutes 11 seconds  Findings:      The perianal and digital rectal examinations were normal.      A 5 mm polyp was found in the transverse colon. The polyp was sessile.  The polyp was removed with a hot snare. Resection and retrieval were       complete. Estimated blood loss: none.      Scattered small-mouthed diverticula were found in the sigmoid colon and       descending colon.      Internal hemorrhoids were found during retroflexion. The hemorrhoids       were medium-sized and Grade I (internal hemorrhoids that do not       prolapse).      The terminal ileum appeared normal. Impression:               - One 5 mm polyp in the transverse colon, removed                            with a hot snare. Resected and retrieved.                           - Diverticulosis in the sigmoid colon and in the                            descending colon.                           - Internal hemorrhoids.                            - The examined portion of the ileum was normal. Recommendation:           - High fiber diet.                           - Await pathology results.                           - Repeat colonoscopy for surveillance based on                            pathology results.                           - No aspirin, ibuprofen, naproxen, or other                            non-steroidal anti-inflammatory drugs for 1 week. Procedure Code(s):        --- Professional ---                           [phone], Colonoscopy, flexible; with removal of                            tumor(s), polyp(s), or other lesion(s) by snare                            technique Diagnosis Code(s):        --- Professional ---                           K92.1, Melena (includes  Hematochezia)                           K63.5, Polyp of colon                           K64.0, First degree hemorrhoids                           K57.30, Diverticulosis of large intestine without                            perforation or abscess without bleeding CPT copyright 2019 [employer]. All rights reserved. The codes documented in this report are preliminary and upon coder review may  be revised to meet current compliance requirements. [name], MD 03/20/2020 11:23:32 AM This report has been signed electronically. Number of Addenda: 0

## 2020-03-20 NOTE — Brief Op Note (Signed)
  Small colon polyp (benign-appearing) removed. Scattered small diverticulosis. Internal hemorrhoids. No active bleeding or blood products seen. Suspect bleeding was due to recent fecal impaction vs diverticular source. No further inpt GI workup planned. Path results will be f/u as outpt. Will sign off. Call if questions. F/U with Dr. Paulita Fujita in 4-6 weeks.

## 2020-03-20 NOTE — Discharge Summary (Signed)
Name: [name] MRN: [id number] DOB: 29-Mar-1936 84 y.o. PCP: [name], MD  Date of Admission: 03/16/2020 10:16 AM Date of Discharge: 03/20/2020 Attending Physician: Axel Filler, *  Discharge Diagnosis: 1. Melena 2. Stercoral Colitis  3. Acute on chronic blood loss anemia, iron deficiency anemia 4. Thrombocytosis   Discharge Medications: Allergies as of 03/20/2020      Reactions   Ambien [zolpidem] Other (See Comments)   Hallucinations and paranoid   Codeine Hives, Nausea And Vomiting      Medication List    TAKE these medications   acetaminophen-codeine 300-30 MG tablet Commonly known as: TYLENOL #3 Take 1 tablet by mouth 2 (two) times daily as needed for severe pain.   amLODipine 5 MG tablet Commonly known as: NORVASC Take 1 tablet (5 mg total) by mouth daily.   aspirin EC 81 MG tablet Take 81 mg by mouth daily.   atorvastatin 20 MG tablet Commonly known as: LIPITOR Take 1 tablet (20 mg total) by mouth daily at 6 PM.   cyanocobalamin 1000 MCG tablet Take 1 tablet (1,000 mcg total) by mouth daily.   DULoxetine 30 MG capsule Commonly known as: CYMBALTA Take 30 mg by mouth daily.   gabapentin 100 MG capsule Commonly known as: NEURONTIN Take 1 capsule (100 mg total) by mouth at bedtime.   Muscle Rub 10-15 % Crea Apply 1 application topically 2 (two) times daily as needed for muscle pain.   pantoprazole 40 MG tablet Commonly known as: PROTONIX TAKE 1 TABLET BY MOUTH DAILY   polyethylene glycol 17 g packet Commonly known as: MIRALAX / GLYCOLAX Take 17 g by mouth daily.   psyllium 95 % Pack Commonly known as: HYDROCIL/METAMUCIL Take 1 packet by mouth 2 (two) times daily.   senna-docusate 8.6-50 MG tablet Commonly known as: Senokot-S Take 2 tablets by mouth 2 (two) times daily. What changed:   when to take this  reasons to take this       Disposition and follow-up:   [name] was discharged from [facility] in Good condition.  At the hospital follow up visit please address:  1.    Monitor for signs of continued GI bleed Patient would benefit from iron supplementation Increased PT/OT follow up   2.  Labs / imaging needed at time of follow-up: CBC, iron panel  3.  Pending labs/ test needing follow-up: None   Follow-up Appointments:   Hospital Course by problem list: 1. Melena Melanotic stool was noticed on admission after fecal disimpaction by ED provider.  After smog enema on day 2 of admission, additional melanotic stool was noticed.  Patient has a past medical history of esophagitis and gastritis that is previously led to significant GI bleed.  Gastroenterology was consulted and recommended colonoscopy.  No evidence of acute bleed on colonoscopy, but there were evidence of diverticula, hemorrhoids and one polyp was removed.  GI bleed self resolved with no additional bleeding noted on discharge day.  Recommend outpatient follow-up with gastroenterology.  2. Stercoral Colitis  Suspected after very tender rectal exam on admission and history of chronic constipation with fecal impaction.  CT abdomen/pelvis was obtained for further evaluation which showed a large fecaloma in the rectal vault and inflammatory findings consistent with stercoral colitis.  Smog enema was ordered and yielded a large bowel movement that relieved the inflamed rectal region.  She received several days of Zosyn for prevention of infection.  No evidence of perforation or hemodynamic  instability during admission.  3.  Acute on chronic blood loss anemia; iron deficiency anemia Patient presented to the ED with symptoms of mild dizziness, but otherwise denied shortness of breath, palpitations.  Initial CBC showed a hemoglobin of 4.2.  2 units of packed red blood cells were ordered in the ED, with which patient responded very well.  She required 1 additional unit prior to discharge.  Iron panel showed iron  saturation of 1% with iron levels of 5 and TIBC of 400.  Total iron deficiency was calculated at 1631 mg.  Reticulocyte index of 0.8 supported hypoproliferation.  She received 1 dosage of Feraheme and tolerated this well.  Recommend continuing to follow CBC and encourage iron supplementation.  4. Thrombocytosis  Platelet count on admission was 1006.  Acute blood loss anemia and iron deficiency anemia likely contributing, however reactive aspect felt to unlikely cause such a high increase.  Evaluation for essential thrombocytosis was initiated with JAK2 genotyping, which returned as normal.  Recommend continue evaluation in the outpatient setting; assess if hematology/oncology referral is indicated.   Discharge Vitals:   BP 102/78 (BP Location: Left Arm)   Pulse 82   Temp 98.2 F (36.8 C) (Oral)   Resp 19   Ht 4\' 6"  (1.372 m)   Wt 50.1 kg   SpO2 95%   BMI 26.65 kg/m   Pertinent Labs, Studies, and Procedures:   CBC Latest Ref Rng & Units 03/20/2020 03/19/2020 03/19/2020  WBC 4.0 - 10.5 K/uL 7.2 - 9.0  Hemoglobin 12.0 - 15.0 g/dL 8.8(L) 8.2(L) 7.0(L)  Hematocrit 36.0 - 46.0 % 30.1(L) 28.6(L) 25.0(L)  Platelets 150 - 400 K/uL 696(H) - 741(H)   BMP Latest Ref Rng & Units 03/20/2020 03/19/2020 03/18/2020  Glucose 70 - 99 mg/dL 86 161(H) 111(H)  BUN 8 - 23 mg/dL 8 15 16   Creatinine 0.44 - 1.00 mg/dL 0.57 0.78 0.77  Sodium 135 - 145 mmol/L 140 140 140  Potassium 3.5 - 5.1 mmol/L 4.1 3.3(L) 3.6  Chloride 98 - 111 mmol/L 102 104 102  CO2 22 - 32 mmol/L 28 28 29   Calcium 8.9 - 10.3 mg/dL 8.5(L) 8.0(L) 8.6(L)   Lab Results  Component Value Date   IRON 5 (L) 03/16/2020   TIBC 400 03/16/2020   FERRITIN 56 03/16/2020   CT Abdomen/Pelvis W Contrast (03/16/2020)  IMPRESSION: 1. There is a large amount of stool at the level of the rectum with rectal wall thickening with adjacent free fluid and fat stranding, concerning for stercoral colitis. 2. There is mild-to-moderate left-sided pelviectasis  without evidence for obstructing stone. This appearance is relatively stable to improved from prior study. 3. Nonobstructing left-sided nephrolithiasis. 4. There is consolidation at the lung bases bilaterally, right worse than left, which may be due to pneumonia or aspiration. 5. Aortic Atherosclerosis (ICD10-I70.0).  Colonoscopy (03/20/2020)  - One 5 mm polyp in the transverse colon, removed with a hot snare. Resected and retrieved. - Diverticulosis in the sigmoid colon and in the descending colon. - Internal hemorrhoids. - The examined portion of the ileum was normal.  Discharge Instructions: Discharge Instructions    Diet - low sodium heart healthy   Complete by: As directed    Discharge instructions   Complete by: As directed    [name],   It was a pleasure taking care of you in the hospital. You were admitted for bleeding and underwent Colonoscopy. Please follow up with Dr. Paulita Fujita in 4-6 weeks.   For your constipation, please continue using  your home enema and MiraLax.   Take care!   Increase activity slowly   Complete by: As directed       Signed: Dr. Jose Persia Internal Medicine PGY-1  Pager: [phone] 03/24/2020, 6:18 PM

## 2020-03-20 NOTE — Progress Notes (Signed)
SLP Cancellation Note  Patient Details Name: AIMAR SIEMINSKI MRN: [id number] DOB: 26-Nov-1936   Cancelled treatment:       Reason Eval/Treat Not Completed: Patient at procedure or test/unavailable   Blythe Hartshorn, Katherene Ponto 03/20/2020, 9:33 AM

## 2020-03-20 NOTE — Progress Notes (Signed)
Occupational Therapy Evaluation Patient Details Name: [name] MRN: [id number] DOB: 14-Apr-1936 Today's Date: 03/20/2020    History of Present Illness 84 y/o female with a PMHx of previous upper GI bleeds 2/2 acute enterogastritis, COPD on 2L home O2, CKD Stage 3, CVA with L. Sided weakness, HTN who presents to the ED with c/o constipation. Pt admitted w/ GI bleeding, colitis, acute blood loss anemia.  Anemia responded well to blood transfusion, noted iprovement in fecal impaction with a SMOG enema. Pt placed on Zosyn for the colitis.    Clinical Impression   Unsure of accuracy of PLOF as pt appears confused. Pt reports that PTA she lived with her son, independent with feeding, grooming, and dressing tasks. Pt's son assist her with toileting and showering. Pt reports that she is able to ambulate with a RW to the kitchen on a daily basis, however later reports that she spends all day in bed only to get up and pivot to [facility]. Pt currently requires setup to max assist for self-care and transfer tasks. Pt tolerated sitting EOB 10+ min with supervision noting 0 instances of LOB. Pt declined additional OOB tasks this date due to fatigue. Pt requested to use bedpan to urinate, refusing to transfer to [facility]. Pt assisted back to bed requiring max assist for toileting task. Pt required mod redirection back to tasks as she is easily distracted by environment and personal conversation. Pt demonstrated decreased ROM, strength, endurance, balance, sitting/standing tolerance, activity tolerance, and safety awareness impacting ability to complete self-care and functional transfer tasks. Recommend skilled OT services to address above deficits in order to promote function and prevent further decline. Recommend SNF placement for additional rehab prior to discharge home.     Follow Up Recommendations  SNF;Supervision/Assistance - 24 hour    Equipment Recommendations  None recommended by OT    Recommendations for  Other Services       Precautions / Restrictions Precautions Precautions: Fall Restrictions Weight Bearing Restrictions: No      Mobility Bed Mobility Overal bed mobility: Needs Assistance Bed Mobility: Supine to Sit;Sit to Supine;Rolling Rolling: Supervision   Supine to sit: Mod assist;HOB elevated Sit to supine: Mod assist;HOB elevated   General bed mobility comments: HOB elevated, use of bed rail  Transfers                 General transfer comment: Pt refused OOB transfers this date due to fatigue    Balance Overall balance assessment: Needs assistance Sitting-balance support: Single extremity supported Sitting balance-Leahy Scale: Fair       Standing balance-Leahy Scale: (Not attempted this date)                             ADL either performed or assessed with clinical judgement   ADL Overall ADL's : Needs assistance/impaired Eating/Feeding: Set up;Bed level   Grooming: Set up;Supervision/safety;Sitting   Upper Body Bathing: Minimal assistance;Sitting   Lower Body Bathing: Maximal assistance;Sitting/lateral leans   Upper Body Dressing : Minimal assistance;Sitting   Lower Body Dressing: Maximal assistance;Sitting/lateral leans   Toilet Transfer: Total assistance(bed pan)   Toileting- Clothing Manipulation and Hygiene: Maximal assistance;Bed level       Functional mobility during ADLs: (Pt declined OOB activity this date) General ADL Comments: Pt tolerated sitting EOB 10+ min with supervision while engaging in therapy tasks. Noted 0 instances of LOB. Pt declined additional OOB tasks due to fatigue.  Vision Baseline Vision/History: Wears glasses Wears Glasses: At all times       Perception     Praxis      Pertinent Vitals/Pain Pain Assessment: No/denies pain     Hand Dominance Right   Extremity/Trunk Assessment Upper Extremity Assessment Upper Extremity Assessment: RUE deficits/detail;LUE deficits/detail;Generalized  weakness RUE Deficits / Details: RUE shld flex ~80 degrees. Elbow, wrist, and digit WFL LUE Deficits / Details: LUE shld flex ~45 degrees. Elbow and wrist ROM WFL. Decreased coordination on left. Pt reports hx of CVA with residual left sided deficits.  LUE Sensation: decreased light touch LUE Coordination: decreased fine motor           Communication Communication Communication: HOH   Cognition Arousal/Alertness: Awake/alert Behavior During Therapy: Restless;Agitated Overall Cognitive Status: No family/caregiver present to determine baseline cognitive functioning                                 General Comments: Pt seems to be confused reporting conflicting PLOF statements.    General Comments  No signs/symptoms of distress. SpO2 maintained in 90s on 1.5L .     Exercises     Shoulder Instructions      Home Living Family/patient expects to be discharged to:: Private residence Living Arrangements: Children Available Help at Discharge: Family;Available 24 hours/day Type of Home: House Home Access: Stairs to enter [employer] of Steps: 2 Entrance Stairs-Rails: Right Home Layout: One level     Bathroom Shower/Tub: Tub/shower unit;Curtain   Bathroom Toilet: Standard Bathroom Accessibility: Yes   Home Equipment: Environmental consultant - 2 wheels;Cane - single point;Bedside commode;Walker - 4 wheels;Wheelchair - manual;Hospital bed;Shower seat   Additional Comments: Unsure of accuracy of PLOF. Pt contradicts previous statements.       Prior Functioning/Environment Level of Independence: Needs assistance  Gait / Transfers Assistance Needed: Pt reports that she is able to ambulate to kitchen with RW on a daily basis. Later in session, pt states that she lays in bed all day. ADL's / Homemaking Assistance Needed: Pt reports being independent in feeding, grooming, and dressing tasks. Pt reports that son assists with toileting and showering. Pt reports that she is able  to step into tub on her own.             OT Problem List: Decreased strength;Decreased range of motion;Decreased activity tolerance;Impaired balance (sitting and/or standing);Decreased coordination;Decreased cognition;Decreased safety awareness      OT Treatment/Interventions: Self-care/ADL training;Therapeutic exercise;Neuromuscular education;Energy conservation;DME and/or AE instruction;Therapeutic activities;Cognitive remediation/compensation;Patient/family education;Balance training    OT Goals(Current goals can be found in the care plan section) Acute Rehab OT Goals Patient Stated Goal: to go home Time For Goal Achievement: 04/03/20 Potential to Achieve Goals: Fair ADL Goals Pt Will Perform Lower Body Dressing: with min assist;sit to/from stand Pt Will Transfer to Toilet: with min assist;bedside commode;stand pivot transfer Pt Will Perform Toileting - Clothing Manipulation and hygiene: with min assist;sit to/from stand Additional ADL Goal #1: Pt to recall and verbalize 3 fall prevention strategies with 0 verbal cues.  OT Frequency: Min 2X/week   Barriers to D/C:            Co-evaluation              AM-PAC OT "6 Clicks" Daily Activity     Outcome Measure Help from another person eating meals?: A Little Help from another person taking care of personal grooming?: A Little Help from another person  toileting, which includes using toliet, bedpan, or urinal?: A Lot Help from another person bathing (including washing, rinsing, drying)?: A Lot Help from another person to put on and taking off regular upper body clothing?: A Little Help from another person to put on and taking off regular lower body clothing?: A Lot 6 Click Score: 15   End of Session Equipment Utilized During Treatment: Oxygen Nurse Communication: Mobility status  Activity Tolerance: Patient limited by fatigue Patient left: in bed;with call bell/phone within reach;with bed alarm set;with nursing/sitter in  room  OT Visit Diagnosis: Unsteadiness on feet (R26.81);Muscle weakness (generalized) (M62.81)                Time: 1410-1446 OT Time Calculation (min): 36 min Charges:  OT General Charges $OT Visit: 1 Visit OT Evaluation $OT Eval Moderate Complexity: 1 Mod OT Treatments $Self Care/Home Management : 8-22 mins  Mauri Brooklyn OTR/L [phone]  Mauri Brooklyn 03/20/2020, 3:28 PM

## 2020-03-20 NOTE — Anesthesia Procedure Notes (Signed)
Procedure Name: MAC Date/Time: 03/20/2020 10:52 AM Performed by: Amadeo Garnet, CRNA Pre-anesthesia Checklist: Patient identified, Emergency Drugs available, Suction available and Patient being monitored Patient Re-evaluated:Patient Re-evaluated prior to induction Oxygen Delivery Method: Simple face mask Preoxygenation: Pre-oxygenation with 100% oxygen Induction Type: IV induction Placement Confirmation: positive ETCO2 Dental Injury: Teeth and Oropharynx as per pre-operative assessment

## 2020-03-20 NOTE — Progress Notes (Signed)
   Subjective:   Ms. Delcourt was examined after her colonoscopy and states that she was doing well.  She reported that she was stable to go home.  Initially, PT/OT had recommended SNF however she continued to deny SNF placement.  She states that her son takes pretty good care of her home and she feels safe  Objective:  Vital signs in last 24 hours: Vitals:   03/19/20 2005 03/19/20 2300 03/20/20 0208 03/20/20 0420  BP: (!) 158/67  (!) 164/72 110/65  Pulse: 85  87 82  Resp: (!) 26  17 15   Temp:   97.9 F (36.6 C) 98 F (36.7 C)  TempSrc:   Oral Oral  SpO2: 100% 100% 100% 94%  Weight:    50.1 kg  Height:        Physical Exam Constitutional:      General: She is not in acute distress.    Appearance: She is well-developed. She is not ill-appearing, toxic-appearing or diaphoretic.  Skin:    General: Skin is warm.     Coloration: Skin is not pale.  Neurological:     Mental Status: She is alert.  Psychiatric:        Mood and Affect: Mood normal.      Assessment/Plan:  Principal Problem:   Upper GI bleed Active Problems:   Hypertension   COPD (chronic obstructive pulmonary disease) (HCC)   Chronic respiratory failure (HCC)   Back pain   Symptomatic anemia   Supplemental oxygen dependent   Thrombocytosis (HCC)   Pressure injury of skin   Fecal impaction in rectum ([place])  [name] is a 84 y/o female with a PMHx of previous upper GI bleeds 2/2 acute enterogastritis, COPD on 2L home O2, CKD Stage 3, CVA, HTN currently admitted forstercoral colitis,upper GI bleed complicated by acute blood loss anemia.  #Melena She had a colonoscopy performed which showed small benign-appearing colonic polyps that were removed, scattered small diverticulosis and internal hemorrhoids.  It is reassuring that her hemoglobin has been stable. - She is safe to discharge today to follow-up with Dr. Paulita Fujita in 4 to 6 weeks   # Stercoral Colitis Improved.  She is advised to continue her  bowel regimen - Miralax TID   # Blood Loss Anemia # Iron Deficiency Anemia Hemoglobin stable at 8.8   # Thrombocytosis Improving.  We will follow up on JAK2 genotype  -Continue to trend platelet count -Monitor for signs of blood clots -JAK2 genotype pending   # COPD - Continuehome dosesupplemental oxygen @ 2L [place]   # CKD Stage 3 Creatinine back at baseline with GFR over 60. Continue to monitor BMP   # Hypertension - Continue home Amlodipine 10 mg   Dispo: Anticipated discharge in approximately2-3day(s).   Dr. Jose Persia Internal Medicine PGY-1  Pager: [phone] 03/20/2020, 6:52 AM

## 2020-03-20 NOTE — Anesthesia Preprocedure Evaluation (Addendum)
Anesthesia Evaluation  Patient identified by MRN, date of birth, ID band Patient awake    Reviewed: Allergy & Precautions, NPO status , Patient's Chart, lab work & pertinent test results  History of Anesthesia Complications Negative for: history of anesthetic complications  Airway Mallampati: II  TM Distance: >3 FB Neck ROM: Full    Dental  (+) Partial Upper,    Pulmonary COPD,  oxygen dependent, former smoker,    Pulmonary exam normal        Cardiovascular hypertension, Pt. on medications Normal cardiovascular exam     Neuro/Psych Depression CVA, Residual Symptoms    GI/Hepatic Neg liver ROS, GERD  Medicated,  Endo/Other  negative endocrine ROS  Renal/GU negative Renal ROS  negative genitourinary   Musculoskeletal  (+) Arthritis ,   Abdominal   Peds  Hematology  (+) anemia , Hgb 8.8   Anesthesia Other Findings Day of surgery medications reviewed with patient.  Reproductive/Obstetrics negative OB ROS                            Anesthesia Physical Anesthesia Plan  ASA: III  Anesthesia Plan: MAC   Post-op Pain Management:    Induction:   PONV Risk Score and Plan: 2 and Treatment may vary due to age or medical condition and Propofol infusion  Airway Management Planned: Natural Airway and Nasal Cannula  Additional Equipment: None  Intra-op Plan:   Post-operative Plan:   Informed Consent: I have reviewed the patients History and Physical, chart, labs and discussed the procedure including the risks, benefits and alternatives for the proposed anesthesia with the patient or authorized representative who has indicated his/her understanding and acceptance.   Patient has DNR.  Discussed DNR with patient and Continue DNR.     Plan Discussed with: CRNA  Anesthesia Plan Comments:        Anesthesia Quick Evaluation

## 2020-03-20 NOTE — Anesthesia Postprocedure Evaluation (Signed)
Anesthesia Post Note  Patient: [name]  Procedure(s) Performed: COLONOSCOPY WITH PROPOFOL (Left ) POLYPECTOMY     Patient location during evaluation: PACU Anesthesia Type: MAC Level of consciousness: awake and alert and oriented Pain management: pain level controlled Vital Signs Assessment: post-procedure vital signs reviewed and stable Respiratory status: spontaneous breathing, nonlabored ventilation and respiratory function stable Cardiovascular status: blood pressure returned to baseline Postop Assessment: no apparent nausea or vomiting Anesthetic complications: no    Last Vitals:  Vitals:   03/20/20 1138 03/20/20 1200  BP: (!) 154/111 99/79  Pulse: 80   Resp: 19 18  Temp:    SpO2: 90% 95%    Last Pain:  Vitals:   03/20/20 1200  TempSrc:   PainSc: [place]

## 2020-03-20 NOTE — TOC Initial Note (Signed)
Transition of Care [facility]) - Initial/Assessment Note    Patient Details  Name: [name] MRN: [id number] Date of Birth: 02/07/1936  Transition of Care [facility]) CM/SW Contact:    Trula Ore, [place] Phone Number: 03/20/2020, 2:16 PM  Clinical Narrative:      CSW spoke with patient at bedside. Patient was not agreeable to SNF placement. Patient also, informed CSW she does not want Home Health services. She said her Son [name] will be able to care for her. Patient does not want anyone to contact [name] in regards to her care. She does not want to bother him. Patient did request transportation at the time of discharge. CSW will call [facility] for patient to go home at time of discharge.  Plan to call [facility] at time of discharge. TOC team will continue to follow.         Expected Discharge Plan: Home/Self Care Barriers to Discharge: No Barriers Identified   Patient Goals and CMS Choice Patient states their goals for this hospitalization and ongoing recovery are:: to go home with care from her son CMS Medicare.gov Compare Post Acute Care list provided to:: Patient Choice offered to / list presented to : Patient  Expected Discharge Plan and Services Expected Discharge Plan: Home/Self Care       Living arrangements for the past 2 months: Single Family Home                                      Prior Living Arrangements/Services Living arrangements for the past 2 months: Single Family Home Lives with:: Self, Adult Children Patient language and need for interpreter reviewed:: Yes Do you feel safe going back to the place where you live?: Yes      Need for Family Participation in Patient Care: Yes (Comment) Care giver support system in place?: Yes (comment)   Criminal Activity/Legal Involvement Pertinent to Current Situation/Hospitalization: No - Comment as needed  Activities of Daily Living Home Assistive Devices/Equipment: None ADL Screening (condition at time of  admission) Patient's cognitive ability adequate to safely complete daily activities?: Yes Is the patient deaf or have difficulty hearing?: Yes Does the patient have difficulty seeing, even when wearing glasses/contacts?: Yes Does the patient have difficulty concentrating, remembering, or making decisions?: Yes Patient able to express need for assistance with ADLs?: Yes Does the patient have difficulty dressing or bathing?: Yes Independently performs ADLs?: No Communication: Independent Dressing (OT): Needs assistance Is this a change from baseline?: Pre-admission baseline Grooming: Needs assistance Is this a change from baseline?: Pre-admission baseline Feeding: Independent Bathing: Needs assistance Is this a change from baseline?: Pre-admission baseline Toileting: Needs assistance Is this a change from baseline?: Pre-admission baseline In/Out Bed: Needs assistance Is this a change from baseline?: Pre-admission baseline Walks in Home: Needs assistance Is this a change from baseline?: Pre-admission baseline Does the patient have difficulty walking or climbing stairs?: Yes Weakness of Legs: Both Weakness of Arms/Hands: Both  Permission Sought/Granted Permission sought to share information with : Case Manager, Customer service manager Permission granted to share information with : Yes, Verbal Permission Granted              Emotional Assessment Appearance:: Appears stated age Attitude/Demeanor/Rapport: Gracious Affect (typically observed): Calm Orientation: : Oriented to Self, Oriented to Place, Oriented to  Time, Oriented to Situation Alcohol / Substance Use: Not Applicable Psych Involvement: No (comment)  Admission diagnosis:  GI  bleed [K92.2] Upper GI bleed [K92.2] Fecal impaction in rectum (HCC) [K56.41] Symptomatic anemia [D64.9] Pressure injury of sacral region, stage 1 [L89.151] Patient Active Problem List   Diagnosis Date Noted  . Fecal impaction in rectum  ([place])   . Thrombocytosis ([place]) 03/16/2020  . Pressure injury of skin 03/16/2020  . Left knee pain 06/18/2019  . Upper GI bleed 06/17/2019  . 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 Acute blood loss anemia   . Symptomatic anemia   . Supplemental oxygen dependent   . Right pontine stroke ([place]) 05/02/2019  . Stroke ([place]) 05/01/2019  . Acute renal failure superimposed on stage 3 chronic kidney disease ([place]) 05/01/2019  . Acute ischemic stroke ([place]) 05/01/2019  . AKI (acute kidney injury) ([place]) 08/29/2018  . Encephalopathy acute 08/22/2018  . Chronic back pain 08/22/2018  . Vitamin B12 deficiency 08/19/2018  . Dehydration   . Tachycardia   . Altered mental state 08/18/2018  . Closed compression fracture of L1 vertebra (HCC)   . Acute cystitis without hematuria   . Pain management   . Intractable pain   . Back pain 10/10/2016  . L2 vertebral fracture ([place]) 10/10/2016  . Acute encephalopathy 12/25/2015  . Drug-induced delirium 12/25/2015  . Acute on chronic respiratory failure ([place]) 12/25/2015  . CAP (community acquired pneumonia) 12/19/2015  . Chronic respiratory failure ([place]) 03/19/2014  . Depression 03/19/2014  . Aspiration pneumonia ([place]) 03/10/2014  . Acute respiratory failure with hypoxia ([place]) 03/10/2014  . Sepsis ([place]) 03/10/2014  . Hypokalemia 03/10/2014  . Hypertension 03/10/2014  . COPD (chronic obstructive pulmonary disease) ([place]) 03/10/2014  . Tobacco abuse 03/10/2014  . Normocytic anemia 06/15/2013  . Generalized abdominal pain 06/15/2013  . Blood in the stool 06/15/2013   PCP:  [name], MD Pharmacy:   [facility] [place], [address] [address] Phone: [phone] Fax: [phone]     Social Determinants of Health (SDOH) Interventions    Readmission Risk Interventions Readmission Risk Prevention Plan 06/20/2019 05/01/2019 08/22/2018  Post Dischage Appt - -  Patient refused  Medication Screening - - Complete  Transportation Screening Complete Complete Complete  PCP follow-up - - Patient refused  PCP or Specialist Appt within 5-7 Days Complete - -  Home Care Screening Complete - -  Medication Review (RN CM) Complete - -  Medication Review (RN Care Manager) - Referral to Pharmacy -  Some recent data might be hidden

## 2020-03-20 NOTE — Interval H&P Note (Signed)
History and Physical Interval Note:  03/20/2020 10:41 AM  [name]  has presented today for surgery, with the diagnosis of blood in stool, anemia.  The various methods of treatment have been discussed with the patient and family. After consideration of risks, benefits and other options for treatment, the patient has consented to  Procedure(s) with comments: COLONOSCOPY WITH PROPOFOL (Left) - needs ultraslim colonoscope as a surgical intervention.  The patient's history has been reviewed, patient examined, no change in status, stable for surgery.  I have reviewed the patient's chart and labs.  Questions were answered to the patient's satisfaction.     [name]

## 2020-03-20 NOTE — Transfer of Care (Signed)
Immediate Anesthesia Transfer of Care Note  Patient: [name]  Procedure(s) Performed: COLONOSCOPY WITH PROPOFOL (Left ) POLYPECTOMY  Patient Location: Endoscopy Unit  Anesthesia Type:MAC  Level of Consciousness: awake, alert  and oriented  Airway & Oxygen Therapy: Patient Spontanous Breathing and Patient connected to nasal cannula oxygen  Post-op Assessment: Report given to RN, Post -op Vital signs reviewed and stable and Patient moving all extremities  Post vital signs: Reviewed and stable  Last Vitals:  Vitals Value Taken Time  BP 94/35 03/20/20 1120  Temp 36.7 C 03/20/20 1120  Pulse 81 03/20/20 1125  Resp 17 03/20/20 1125  SpO2 100 % 03/20/20 1125  Vitals shown include unvalidated device data.  Last Pain:  Vitals:   03/20/20 1120  TempSrc: Axillary  PainSc: 7       Patients Stated Pain Goal: 2 (33/58/25 1898)  Complications: No apparent anesthesia complications

## 2020-03-21 LAB — SURGICAL PATHOLOGY

## 2020-03-22 LAB — JAK2 GENOTYPR

## 2020-04-14 ENCOUNTER — Other Ambulatory Visit: Payer: Self-pay

## 2020-04-14 ENCOUNTER — Inpatient Hospital Stay (HOSPITAL_COMMUNITY)
Admission: EM | Admit: 2020-04-14 | Discharge: 2020-04-18 | DRG: 391 | Disposition: A | Discharge status: Home / Self Care | Payer: Medicare Other | Attending: Internal Medicine | Admitting: Internal Medicine

## 2020-04-14 DIAGNOSIS — Z66 Do not resuscitate: Secondary | ICD-10-CM | POA: Diagnosis present

## 2020-04-14 DIAGNOSIS — K921 Melena: Secondary | ICD-10-CM | POA: Diagnosis present

## 2020-04-14 DIAGNOSIS — Z87891 Personal history of nicotine dependence: Secondary | ICD-10-CM

## 2020-04-14 DIAGNOSIS — D62 Acute posthemorrhagic anemia: Secondary | ICD-10-CM | POA: Diagnosis present

## 2020-04-14 DIAGNOSIS — Z20822 Contact with and (suspected) exposure to covid-19: Secondary | ICD-10-CM | POA: Diagnosis present

## 2020-04-14 DIAGNOSIS — J9610 Chronic respiratory failure, unspecified whether with hypoxia or hypercapnia: Secondary | ICD-10-CM | POA: Diagnosis present

## 2020-04-14 DIAGNOSIS — Z9981 Dependence on supplemental oxygen: Secondary | ICD-10-CM

## 2020-04-14 DIAGNOSIS — N939 Abnormal uterine and vaginal bleeding, unspecified: Secondary | ICD-10-CM

## 2020-04-14 DIAGNOSIS — K922 Gastrointestinal hemorrhage, unspecified: Secondary | ICD-10-CM | POA: Diagnosis present

## 2020-04-14 DIAGNOSIS — M810 Age-related osteoporosis without current pathological fracture: Secondary | ICD-10-CM | POA: Diagnosis present

## 2020-04-14 DIAGNOSIS — B964 Proteus (mirabilis) (morganii) as the cause of diseases classified elsewhere: Secondary | ICD-10-CM | POA: Diagnosis present

## 2020-04-14 DIAGNOSIS — Z8249 Family history of ischemic heart disease and other diseases of the circulatory system: Secondary | ICD-10-CM

## 2020-04-14 DIAGNOSIS — K209 Esophagitis, unspecified without bleeding: Secondary | ICD-10-CM | POA: Diagnosis present

## 2020-04-14 DIAGNOSIS — J449 Chronic obstructive pulmonary disease, unspecified: Secondary | ICD-10-CM | POA: Diagnosis present

## 2020-04-14 DIAGNOSIS — K5730 Diverticulosis of large intestine without perforation or abscess without bleeding: Secondary | ICD-10-CM | POA: Diagnosis present

## 2020-04-14 DIAGNOSIS — Z7982 Long term (current) use of aspirin: Secondary | ICD-10-CM

## 2020-04-14 DIAGNOSIS — G4700 Insomnia, unspecified: Secondary | ICD-10-CM | POA: Diagnosis present

## 2020-04-14 DIAGNOSIS — G8929 Other chronic pain: Secondary | ICD-10-CM | POA: Diagnosis present

## 2020-04-14 DIAGNOSIS — K2970 Gastritis, unspecified, without bleeding: Secondary | ICD-10-CM | POA: Diagnosis present

## 2020-04-14 DIAGNOSIS — K5289 Other specified noninfective gastroenteritis and colitis: Secondary | ICD-10-CM | POA: Diagnosis not present

## 2020-04-14 DIAGNOSIS — M549 Dorsalgia, unspecified: Secondary | ICD-10-CM | POA: Diagnosis present

## 2020-04-14 DIAGNOSIS — I10 Essential (primary) hypertension: Secondary | ICD-10-CM | POA: Diagnosis present

## 2020-04-14 DIAGNOSIS — L89313 Pressure ulcer of right buttock, stage 3: Secondary | ICD-10-CM | POA: Diagnosis present

## 2020-04-14 DIAGNOSIS — Z833 Family history of diabetes mellitus: Secondary | ICD-10-CM

## 2020-04-14 DIAGNOSIS — N3090 Cystitis, unspecified without hematuria: Secondary | ICD-10-CM | POA: Diagnosis present

## 2020-04-14 DIAGNOSIS — Z79899 Other long term (current) drug therapy: Secondary | ICD-10-CM

## 2020-04-14 DIAGNOSIS — Z8673 Personal history of transient ischemic attack (TIA), and cerebral infarction without residual deficits: Secondary | ICD-10-CM

## 2020-04-14 DIAGNOSIS — K640 First degree hemorrhoids: Secondary | ICD-10-CM | POA: Diagnosis present

## 2020-04-14 DIAGNOSIS — L89159 Pressure ulcer of sacral region, unspecified stage: Secondary | ICD-10-CM

## 2020-04-14 DIAGNOSIS — K648 Other hemorrhoids: Secondary | ICD-10-CM | POA: Diagnosis present

## 2020-04-14 LAB — CBC WITH DIFFERENTIAL/PLATELET
Abs Immature Granulocytes: 0.09 10*3/uL — ABNORMAL HIGH (ref 0.00–0.07)
Basophils Absolute: 0.1 10*3/uL (ref 0.0–0.1)
Basophils Relative: 1 %
Eosinophils Absolute: 0.4 10*3/uL (ref 0.0–0.5)
Eosinophils Relative: 4 %
HCT: 25.5 % — ABNORMAL LOW (ref 36.0–46.0)
Hemoglobin: 7 g/dL — ABNORMAL LOW (ref 12.0–15.0)
Immature Granulocytes: 1 %
Lymphocytes Relative: 21 %
Lymphs Abs: 2 10*3/uL (ref 0.7–4.0)
MCH: 25.3 pg — ABNORMAL LOW (ref 26.0–34.0)
MCHC: 27.5 g/dL — ABNORMAL LOW (ref 30.0–36.0)
MCV: 92.1 fL (ref 80.0–100.0)
Monocytes Absolute: 0.8 10*3/uL (ref 0.1–1.0)
Monocytes Relative: 8 %
Neutro Abs: 6.3 10*3/uL (ref 1.7–7.7)
Neutrophils Relative %: 65 %
Platelets: 768 10*3/uL — ABNORMAL HIGH (ref 150–400)
RBC: 2.77 MIL/uL — ABNORMAL LOW (ref 3.87–5.11)
RDW: 19.9 % — ABNORMAL HIGH (ref 11.5–15.5)
WBC: 9.7 10*3/uL (ref 4.0–10.5)
nRBC: 0 % (ref 0.0–0.2)

## 2020-04-14 LAB — PROTIME-INR
INR: 0.9 (ref 0.8–1.2)
Prothrombin Time: 12.3 seconds (ref 11.4–15.2)

## 2020-04-14 MED ORDER — ALBUTEROL SULFATE HFA 108 (90 BASE) MCG/ACT IN AERS
4.0000 | INHALATION_SPRAY | Freq: Once | RESPIRATORY_TRACT | Status: AC
  Administered 2020-04-15: 4 via RESPIRATORY_TRACT
  Filled 2020-04-14: qty 6.7

## 2020-04-14 MED ORDER — AEROCHAMBER PLUS FLO-VU LARGE MISC
1.0000 | Freq: Once | Status: AC
  Administered 2020-04-15: 1

## 2020-04-14 MED ORDER — ONDANSETRON HCL 4 MG/2ML IJ SOLN
4.0000 mg | Freq: Once | INTRAMUSCULAR | Status: AC
  Administered 2020-04-15: 4 mg via INTRAVENOUS
  Filled 2020-04-14: qty 2

## 2020-04-14 NOTE — ED Notes (Signed)
Pt has copy of DNR at bedside, but currently states wishes for CPR/intubation as life saving measures.

## 2020-04-14 NOTE — ED Triage Notes (Signed)
Pt arrives via [facility] stretcher c/o GI bleed. Pt called 911 after using the bathroom and finding "bright red blood" on toilet paper when she wiped. Partial BM this morning. On 2.5 lpm home O2

## 2020-04-14 NOTE — ED Provider Notes (Signed)
[facility] EMERGENCY DEPARTMENT Provider Note   CSN: [id number] Arrival date & time: 04/14/20  2239     History Chief Complaint  Patient presents with  . GI Bleeding    [name] is a 84 y.o. female with a history of chronic respiratory failure on 2.5 L Home O2 continuously, COPD, GI bleed, osteoporosis, and hypertension who presents the emergency department with a chief complaint of rectal bleeding.  The patient is followed by Dr. Paulita Fujita with GI.  She underwent a colonoscopy on March 24 that demonstrated medium sized grade 1 internal hemorrhoids, scattered small mouth diverticula in the sigmoid colon and descending colon, and a 5 mm polyp in the transverse colon that was removed.  Colonoscopy was performed during an admission for stercoral colitis with fecal impaction and GI bleed.  She presented with a hemoglobin of 4.2 and received blood transfusion during admission.  She supposed to follow-up with Dr. Paulita Fujita next week in the office.  She reports that she has had a small amount of hematochezia almost daily since the colonoscopy.  Most of the time, there is only a small amount of blood on the toilet tissue.  She notices that she only seems to use the blood at night.  Earlier [employer], she went to the restroom and noticed that she passed a much larger amount of blood that filled the toilet bowl was also noted on the toilet tissue, which prompted her to call EMS.  She reports that she has been been feeling generally weak for several weeks.  She denies dizziness, lightheadedness, shortness of breath, hematemesis, hemoptysis, epistaxis, bleeding from her gums, abdominal pain, nausea, vomiting, diarrhea, constipation, fever or chills.  She has been taking MiraLAX daily.  She has been having loose bowel movements.  Last bowel movement was this morning.   She also feels as if she has had a "lump of mucous" that she cannot cough up in her throat for the last few weeks.   Symptoms are worse today.  She does not take any blood thinners.  She is a DNR.   The history is provided by the patient. No language interpreter was used.       Past Medical History:  Diagnosis Date  . Arthritis   . CAP (community acquired pneumonia)    multiple admissions  . Chronic back pain 08/22/2018  . Colitis 03/19/2014  . Compression fx, lumbar spine ([place])   . COPD (chronic obstructive pulmonary disease) ([place])   . Delirium   . Hypertension   . On home oxygen therapy 06-14-13   continuos 2.5l/m nasally-24/7  . Osteoporosis   . Sacral fracture [facility]) 2014    Patient Active Problem List   Diagnosis Date Noted  . Fecal impaction in rectum ([place])   . Thrombocytosis ([place]) 03/16/2020  . Pressure injury of skin 03/16/2020  . Left knee pain 06/18/2019  . Upper GI bleed 06/17/2019  . 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 Acute blood loss anemia   . Symptomatic anemia   . Supplemental oxygen dependent   . Right pontine stroke ([place]) 05/02/2019  . Stroke ([place]) 05/01/2019  . Acute renal failure superimposed on stage 3 chronic kidney disease ([place]) 05/01/2019  . Acute ischemic stroke ([place]) 05/01/2019  . AKI (acute kidney injury) ([place]) 08/29/2018  . Encephalopathy acute 08/22/2018  . Chronic back pain 08/22/2018  . Vitamin B12 deficiency 08/19/2018  . Dehydration   . Tachycardia   . Altered mental state 08/18/2018  . Closed compression fracture  of L1 vertebra ([place])   . Acute cystitis without hematuria   . Pain management   . Intractable pain   . Back pain 10/10/2016  . L2 vertebral fracture ([place]) 10/10/2016  . Acute encephalopathy 12/25/2015  . Drug-induced delirium 12/25/2015  . Acute on chronic respiratory failure ([place]) 12/25/2015  . CAP (community acquired pneumonia) 12/19/2015  . Chronic respiratory failure ([place]) 03/19/2014  . Depression 03/19/2014  . Aspiration pneumonia ([place]) 03/10/2014  . Acute respiratory failure with hypoxia ([place]) 03/10/2014  . Sepsis ([place]) 03/10/2014   . Hypokalemia 03/10/2014  . Hypertension 03/10/2014  . COPD (chronic obstructive pulmonary disease) ([place]) 03/10/2014  . Tobacco abuse 03/10/2014  . Normocytic anemia 06/15/2013  . Generalized abdominal pain 06/15/2013  . Blood in the stool 06/15/2013    Past Surgical History:  Procedure Laterality Date  . ABDOMINAL HYSTERECTOMY    . BIOPSY  05/24/2019   Procedure: BIOPSY;  Surgeon: Ronald Lobo, MD;  Location: [facility];  Service: Endoscopy;;  . COLONOSCOPY WITH PROPOFOL Left 03/20/2020   Procedure: COLONOSCOPY WITH PROPOFOL;  Surgeon: Wilford Corner, MD;  Location: [facility];  Service: Gastroenterology;  Laterality: Left;  needs ultraslim colonoscope  . ESOPHAGOGASTRODUODENOSCOPY N/A 06/15/2013   Procedure: ESOPHAGOGASTRODUODENOSCOPY (EGD);  Surgeon: [name], MD;  Location: [facility];  Service: Endoscopy;  Laterality: N/A;  . ESOPHAGOGASTRODUODENOSCOPY (EGD) WITH PROPOFOL N/A 05/24/2019   Procedure: ESOPHAGOGASTRODUODENOSCOPY (EGD) WITH PROPOFOL;  Surgeon: Ronald Lobo, MD;  Location: [facility];  Service: Endoscopy;  Laterality: N/A;  . IR GENERIC HISTORICAL  10/15/2016   IR KYPHO LUMBAR INC FX REDUCE BONE BX UNI/BIL CANNULATION INC/IMAGING 10/15/2016 Luanne Bras, MD [facility]-INTERV RAD  . POLYPECTOMY  03/20/2020   Procedure: POLYPECTOMY;  Surgeon: Wilford Corner, MD;  Location: [facility] END[facility]COPY;  Service: Gastroenterology;;  . sacroplasty  06-14-13   05-10-13 IVR [facility]E for fracture stabilization     OB History   No obstetric history on file.     Family History  Problem Relation Age of Onset  . Hypertension Sister   . Diabetes Mellitus II Sister     Social History   Tobacco Use  . Smoking status: Former Smoker    Types: Cigarettes    Quit date: 06/15/2011    Years since quitting: 8.8  . Smokeless tobacco: Never Used  Substance Use Topics  . Alcohol use: No  . Drug use: No    Home Medications Prior to Admission medications   Medication  Sig Start Date End Date Taking? Authorizing Provider  polyethylene glycol (MIRALAX / GLYCOLAX) 17 g packet Take 17 g by mouth daily. 03/20/20  Yes Jose Persia, MD  senna-docusate (SENOKOT-S) 8.6-50 MG tablet Take 2 tablets by mouth 2 (two) times daily. Patient taking differently: Take 2 tablets by mouth 2 (two) times daily as needed for mild constipation.  05/26/19  Yes Love, Ivan Anchors, PA-C  acetaminophen-codeine (TYLENOL #3) 300-30 MG tablet Take 1 tablet by mouth 2 (two) times daily as needed for severe pain. Patient not taking: Reported on 06/19/2019 05/26/19   Love, Ivan Anchors, PA-C  amLODipine (NORVASC) 5 MG tablet Take 1 tablet (5 mg total) by mouth daily. Patient not taking: Reported on 03/17/2020 05/26/19   Love, Ivan Anchors, PA-C  atorvastatin (LIPITOR) 20 MG tablet Take 1 tablet (20 mg total) by mouth daily at 6 PM. Patient not taking: Reported on 03/17/2020 05/26/19   Love, Ivan Anchors, PA-C  cyanocobalamin 1000 MCG tablet Take 1 tablet (1,000 mcg total) by mouth daily. Patient not taking: Reported  on 03/17/2020 05/26/19   Bary Leriche, PA-C  gabapentin (NEURONTIN) 100 MG capsule Take 1 capsule (100 mg total) by mouth at bedtime. Patient not taking: Reported on 03/17/2020 05/26/19   Love, Ivan Anchors, PA-C  Menthol-Methyl Salicylate (MUSCLE RUB) 10-15 % CREA Apply 1 application topically 2 (two) times daily as needed for muscle pain. Patient not taking: Reported on 04/15/2020 05/26/19   Love, Ivan Anchors, PA-C  pantoprazole (PROTONIX) 40 MG tablet TAKE 1 TABLET BY MOUTH DAILY Patient not taking: No sig reported 05/26/19   Love, Ivan Anchors, PA-C  psyllium (HYDROCIL/METAMUCIL) 95 % PACK Take 1 packet by mouth 2 (two) times daily. Patient not taking: Reported on 03/17/2020 06/20/19   Georgette Shell, MD    Allergies    Ambien [zolpidem] and Codeine  Review of Systems   Review of Systems  Constitutional: Negative for activity change, chills and fever.  Respiratory: Negative for shortness of breath.    Cardiovascular: Negative for chest pain and palpitations.  Gastrointestinal: Positive for anal bleeding and blood in stool. Negative for abdominal pain, constipation, nausea and vomiting.  Genitourinary: Negative for dysuria and urgency.  Musculoskeletal: Negative for back pain.  Skin: Negative for rash.  Allergic/Immunologic: Negative for immunocompromised state.  Neurological: Negative for dizziness, syncope, weakness, numbness and headaches.  Psychiatric/Behavioral: Negative for confusion.    Physical Exam Updated Vital Signs BP (!) 131/56   Pulse 90   Temp 98 F (36.7 C) (Oral)   Resp 19   SpO2 99%   Physical Exam Vitals and nursing note reviewed.  Constitutional:      General: She is not in acute distress.    Interventions: Nasal cannula in place.     Comments: Frail, chronically ill-appearing elderly female.  No acute distress.  HENT:     Head: Normocephalic.  Eyes:     Conjunctiva/sclera: Conjunctivae normal.  Cardiovascular:     Rate and Rhythm: Normal rate and regular rhythm.     Heart sounds: No murmur. No friction rub. No gallop.   Pulmonary:     Effort: Pulmonary effort is normal. No respiratory distress.     Breath sounds: No stridor. No rhonchi or rales.  Abdominal:     General: There is no distension.     Palpations: Abdomen is soft. There is no mass.     Tenderness: There is no abdominal tenderness. There is no right CVA tenderness, left CVA tenderness, guarding or rebound.     Hernia: A hernia is present.     Comments: Ventral wall hernia that is easily reduced.  Nontender.  Abdomen is soft without distention and nontender.  No palpable masses.  Genitourinary:    Comments: Chaperoned exam.  There is a small amount of melanotic stool noted around the rectal introitus.  No hematochezia.  No external hemorrhoids.  Normal rectal tone.  Sacral decubitus ulcer with bruising noted to the right buttock. See picture below.  Verbal permission received from patient  to add photo to her chart. Musculoskeletal:     Cervical back: Neck supple.     Right lower leg: No edema.     Left lower leg: No edema.  Skin:    General: Skin is warm.     Findings: No rash.  Neurological:     Mental Status: She is alert.  Psychiatric:        Behavior: Behavior normal.       ED Results / Procedures / Treatments   Labs (all labs ordered are  listed, but only abnormal results are displayed) Labs Reviewed  COMPREHENSIVE METABOLIC PANEL - Abnormal; Notable for the following components:      Result Value   Glucose, Bld 117 (*)    BUN 28 (*)    Calcium 8.5 (*)    Albumin 2.7 (*)    AST 13 (*)    Total Bilirubin 0.2 (*)    All other components within normal limits  CBC WITH DIFFERENTIAL/PLATELET - Abnormal; Notable for the following components:   RBC 2.77 (*)    Hemoglobin 7.0 (*)    HCT 25.5 (*)    MCH 25.3 (*)    MCHC 27.5 (*)    RDW 19.9 (*)    Platelets 768 (*)    Abs Immature Granulocytes 0.09 (*)    All other components within normal limits  POC OCCULT BLOOD, ED - Abnormal; Notable for the following components:   Fecal Occult Bld POSITIVE (*)    All other components within normal limits  SARS CORONAVIRUS 2 (TAT 6-24 HRS)  PROTIME-INR  TYPE AND SCREEN  PREPARE RBC (CROSSMATCH)    EKG None  Radiology No results found.  Procedures .Critical Care Performed by: Joanne Gavel, PA-C Authorized by: Joanne Gavel, PA-C   Critical care provider statement:    Critical care time (minutes):  40   Critical care time was exclusive of:  Separately billable procedures and treating other patients and teaching time   Critical care was necessary to treat or prevent imminent or life-threatening deterioration of the following conditions: GI bleed.   Critical care was time spent personally by me on the following activities:  Development of treatment plan with patient or surrogate, discussions with consultants, evaluation of patient's response to  treatment, examination of patient, obtaining history from patient or surrogate, ordering and performing treatments and interventions, ordering and review of laboratory studies, ordering and review of radiographic studies, pulse oximetry, re-evaluation of patient's condition and review of old charts   I assumed direction of critical care for this patient from another provider in my specialty: no     (including critical care time)  Medications Ordered in ED Medications  ondansetron (ZOFRAN) injection 4 mg (has no administration in time range)  albuterol (VENTOLIN HFA) 108 (90 Base) MCG/ACT inhaler 4 puff (has no administration in time range)  AeroChamber Plus Flo-Vu Large MISC 1 each (has no administration in time range)  0.9 %  sodium chloride infusion (has no administration in time range)    ED Course  I have reviewed the triage vital signs and the nursing notes.  Pertinent labs & imaging results that were available during my care of the patient were reviewed by me and considered in my medical decision making (see chart for details).    MDM Rules/Calculators/A&P                      84 year old female with a history of chronic respiratory failure on 2.5 L Home O2 continuously, COPD, GI bleed, osteoporosis, and hypertension who presents to the emergency department by EMS with hematochezia that has been intermittently ongoing for the last 3 to 4 weeks, but acutely worsened today.  Mildly tachycardic on arrival.  No hypoxia or tachypnea.  She is normotensive.  The patient was seen and independently evaluated by Dr. Christy Gentles, attending physician.  Hemoglobin is 7.0 today, down from 8.8 during her last admission almost 1 month ago.  Hemoccult is positive.  She has melanotic stool on rectal  exam. However, she stable at this time.  Emergent GI consult is not indicated.  Will order 1 unit of packed RBCs and plan for admission to the internal medicine residency team.  BUN is elevated at 28, but  significantly improved from last admission.  She does have a history of upper GI bleed.  She has had no hematemesis.  Abdominal exam is benign.  She has had no recent constipation.  I doubt she has recurrent stercoral colitis or fecal impaction.  She was also given albuterol inhaler with a spacer as she has been having globus sensation trying to cough up some mucus that she feels is stuck in her throat.  Consulted the internal medicine residency team and Dr. Madilyn Fireman will accept the patient for admission.  The patient appears reasonably stabilized for admission considering the current resources, flow, and capabilities available in the ED at this time, and I doubt any other [facility] requiring further screening and/or treatment in the ED prior to admission.  Final Clinical Impression(s) / ED Diagnoses Final diagnoses:  Acute GI bleeding  Pressure injury of skin of sacral region, unspecified injury stage    Rx / DC Orders ED Discharge Orders    None       Ann Bohne A, PA-C 04/15/20 0100    Ripley Fraise, MD 04/15/20 [phone]

## 2020-04-15 ENCOUNTER — Other Ambulatory Visit: Payer: Self-pay

## 2020-04-15 ENCOUNTER — Encounter (HOSPITAL_COMMUNITY): Payer: Self-pay | Admitting: Internal Medicine

## 2020-04-15 DIAGNOSIS — Z9889 Other specified postprocedural states: Secondary | ICD-10-CM

## 2020-04-15 DIAGNOSIS — Z87891 Personal history of nicotine dependence: Secondary | ICD-10-CM

## 2020-04-15 DIAGNOSIS — K5730 Diverticulosis of large intestine without perforation or abscess without bleeding: Secondary | ICD-10-CM

## 2020-04-15 DIAGNOSIS — Z7982 Long term (current) use of aspirin: Secondary | ICD-10-CM

## 2020-04-15 DIAGNOSIS — K209 Esophagitis, unspecified without bleeding: Secondary | ICD-10-CM

## 2020-04-15 DIAGNOSIS — D473 Essential (hemorrhagic) thrombocythemia: Secondary | ICD-10-CM

## 2020-04-15 DIAGNOSIS — Z888 Allergy status to other drugs, medicaments and biological substances status: Secondary | ICD-10-CM

## 2020-04-15 DIAGNOSIS — F329 Major depressive disorder, single episode, unspecified: Secondary | ICD-10-CM

## 2020-04-15 DIAGNOSIS — Z8673 Personal history of transient ischemic attack (TIA), and cerebral infarction without residual deficits: Secondary | ICD-10-CM

## 2020-04-15 DIAGNOSIS — L89319 Pressure ulcer of right buttock, unspecified stage: Secondary | ICD-10-CM | POA: Diagnosis not present

## 2020-04-15 DIAGNOSIS — Z79899 Other long term (current) drug therapy: Secondary | ICD-10-CM

## 2020-04-15 DIAGNOSIS — J449 Chronic obstructive pulmonary disease, unspecified: Secondary | ICD-10-CM

## 2020-04-15 DIAGNOSIS — Z86010 Personal history of colonic polyps: Secondary | ICD-10-CM

## 2020-04-15 DIAGNOSIS — K5900 Constipation, unspecified: Secondary | ICD-10-CM

## 2020-04-15 DIAGNOSIS — K922 Gastrointestinal hemorrhage, unspecified: Secondary | ICD-10-CM | POA: Diagnosis not present

## 2020-04-15 DIAGNOSIS — D649 Anemia, unspecified: Secondary | ICD-10-CM

## 2020-04-15 DIAGNOSIS — K2970 Gastritis, unspecified, without bleeding: Secondary | ICD-10-CM

## 2020-04-15 DIAGNOSIS — Z885 Allergy status to narcotic agent status: Secondary | ICD-10-CM

## 2020-04-15 DIAGNOSIS — K648 Other hemorrhoids: Secondary | ICD-10-CM

## 2020-04-15 DIAGNOSIS — Z9981 Dependence on supplemental oxygen: Secondary | ICD-10-CM

## 2020-04-15 DIAGNOSIS — I10 Essential (primary) hypertension: Secondary | ICD-10-CM

## 2020-04-15 LAB — CBC
HCT: 26.3 % — ABNORMAL LOW (ref 36.0–46.0)
Hemoglobin: 7.6 g/dL — ABNORMAL LOW (ref 12.0–15.0)
MCH: 24 pg — ABNORMAL LOW (ref 26.0–34.0)
MCHC: 28.9 g/dL — ABNORMAL LOW (ref 30.0–36.0)
MCV: 83 fL (ref 80.0–100.0)
Platelets: 648 10*3/uL — ABNORMAL HIGH (ref 150–400)
RBC: 3.17 MIL/uL — ABNORMAL LOW (ref 3.87–5.11)
WBC: 10.5 10*3/uL (ref 4.0–10.5)
nRBC: 0 % (ref 0.0–0.2)

## 2020-04-15 LAB — COMPREHENSIVE METABOLIC PANEL
ALT: 10 U/L (ref 0–44)
AST: 13 U/L — ABNORMAL LOW (ref 15–41)
Albumin: 2.7 g/dL — ABNORMAL LOW (ref 3.5–5.0)
Alkaline Phosphatase: 86 U/L (ref 38–126)
Anion gap: 9 (ref 5–15)
BUN: 28 mg/dL — ABNORMAL HIGH (ref 8–23)
CO2: 31 mmol/L (ref 22–32)
Calcium: 8.5 mg/dL — ABNORMAL LOW (ref 8.9–10.3)
Chloride: 100 mmol/L (ref 98–111)
Creatinine, Ser: 0.67 mg/dL (ref 0.44–1.00)
GFR calc Af Amer: >60 mL/min (ref >60)
GFR calc non Af Amer: >60 mL/min (ref >60)
Glucose, Bld: 117 mg/dL — ABNORMAL HIGH (ref 70–99)
Potassium: 4.9 mmol/L (ref 3.5–5.1)
Sodium: 140 mmol/L (ref 135–145)
Total Bilirubin: 0.2 mg/dL — ABNORMAL LOW (ref 0.3–1.2)
Total Protein: 6.9 g/dL (ref 6.5–8.1)

## 2020-04-15 LAB — SARS CORONAVIRUS 2 (TAT 6-24 HRS): SARS Coronavirus 2: NEGATIVE

## 2020-04-15 LAB — BASIC METABOLIC PANEL
Anion gap: 10 (ref 5–15)
BUN: 24 mg/dL — ABNORMAL HIGH (ref 8–23)
CO2: 29 mmol/L (ref 22–32)
Calcium: 8.3 mg/dL — ABNORMAL LOW (ref 8.9–10.3)
Chloride: 99 mmol/L (ref 98–111)
Creatinine, Ser: 0.68 mg/dL (ref 0.44–1.00)
GFR calc Af Amer: >60 mL/min (ref >60)
GFR calc non Af Amer: >60 mL/min (ref >60)
Glucose, Bld: 124 mg/dL — ABNORMAL HIGH (ref 70–99)
Potassium: 4 mmol/L (ref 3.5–5.1)
Sodium: 138 mmol/L (ref 135–145)

## 2020-04-15 LAB — PREPARE RBC (CROSSMATCH)

## 2020-04-15 LAB — POC OCCULT BLOOD, ED: Fecal Occult Bld: POSITIVE — AB

## 2020-04-15 MED ORDER — ONDANSETRON HCL 4 MG/2ML IJ SOLN
4.0000 mg | Freq: Four times a day (QID) | INTRAMUSCULAR | Status: DC | PRN
  Administered 2020-04-15: 4 mg via INTRAVENOUS
  Filled 2020-04-15: qty 2

## 2020-04-15 MED ORDER — KETOROLAC TROMETHAMINE 15 MG/ML IJ SOLN: 15.0000 mg | Freq: Three times a day (TID) | INTRAMUSCULAR | Status: DC | PRN

## 2020-04-15 MED ORDER — SODIUM CHLORIDE 0.9 % IV SOLN
10.0000 mL/h | Freq: Once | INTRAVENOUS | Status: AC
  Administered 2020-04-15: 10 mL/h via INTRAVENOUS

## 2020-04-15 MED ORDER — SODIUM CHLORIDE 0.9 % IV SOLN
8.0000 mg/h | INTRAVENOUS | Status: AC
  Administered 2020-04-15 – 2020-04-17 (×4): 8 mg/h via INTRAVENOUS
  Filled 2020-04-15 (×6): qty 80

## 2020-04-15 MED ORDER — OXYCODONE-ACETAMINOPHEN 5-325 MG PO TABS
1.0000 | ORAL_TABLET | Freq: Four times a day (QID) | ORAL | Status: DC | PRN
  Administered 2020-04-15 – 2020-04-18 (×11): 1 via ORAL
  Filled 2020-04-15 (×12): qty 1

## 2020-04-15 MED ORDER — ACETAMINOPHEN 325 MG PO TABS
650.0000 mg | ORAL_TABLET | Freq: Four times a day (QID) | ORAL | Status: DC | PRN
  Administered 2020-04-17 – 2020-04-18 (×3): 650 mg via ORAL
  Filled 2020-04-15 (×3): qty 2

## 2020-04-15 MED ORDER — ACETAMINOPHEN 650 MG RE SUPP: 650.0000 mg | Freq: Four times a day (QID) | RECTAL | Status: DC | PRN

## 2020-04-15 MED ORDER — PANTOPRAZOLE SODIUM 40 MG IV SOLR: 40.0000 mg | Freq: Two times a day (BID) | INTRAVENOUS | Status: DC

## 2020-04-15 MED ORDER — KETOROLAC TROMETHAMINE 30 MG/ML IJ SOLN
30.0000 mg | Freq: Once | INTRAMUSCULAR | Status: AC
  Administered 2020-04-15: 30 mg via INTRAVENOUS
  Filled 2020-04-15: qty 1

## 2020-04-15 MED ORDER — HYDROMORPHONE HCL 1 MG/ML IJ SOLN
0.5000 mg | Freq: Once | INTRAMUSCULAR | Status: AC
  Administered 2020-04-15: 0.5 mg via INTRAVENOUS
  Filled 2020-04-15: qty 1

## 2020-04-15 MED ORDER — SODIUM CHLORIDE 0.9 % IV SOLN
80.0000 mg | Freq: Once | INTRAVENOUS | Status: AC
  Administered 2020-04-15: 80 mg via INTRAVENOUS
  Filled 2020-04-15: qty 80

## 2020-04-15 NOTE — Progress Notes (Signed)
Patient is admitted from ED with GI bleed. Patient alert oriented x 4. Vital signs within normal range, c/o headache. Patient is in bed locked at lower level, bedside table, call light and telephone at bedside. Will continue to monitor patient.

## 2020-04-15 NOTE — Consult Note (Signed)
Reason for Consult: Bright red blood per rectum slight hemoglobin drop Referring Physician: Hospital team  [name] is an 84 y.o. female.  HPI: Patient seen and examined in her office and hospital computer chart reviewed and case discussed with the hospital team and she has had some periodic bright red blood per rectum since discharge but no other GI complaints and her hemoglobin dropped from 8.8-7 and we are consulted for further work-up and plans and she is on an aspirin a day at home  Past Medical History:  Diagnosis Date  . Arthritis   . CAP (community acquired pneumonia)    multiple admissions  . Chronic back pain 08/22/2018  . Colitis 03/19/2014  . Compression fx, lumbar spine ([place])   . COPD (chronic obstructive pulmonary disease) ([place])   . Delirium   . Hypertension   . On home oxygen therapy 06-14-13   continuos 2.5l/m nasally-24/7  . Osteoporosis   . Sacral fracture ([place]) 2014    Past Surgical History:  Procedure Laterality Date  . ABDOMINAL HYSTERECTOMY    . BIOPSY  05/24/2019   Procedure: BIOPSY;  Surgeon: Ronald Lobo, MD;  Location: [facility];  Service: Endoscopy;;  . COLONOSCOPY WITH PROPOFOL Left 03/20/2020   Procedure: COLONOSCOPY WITH PROPOFOL;  Surgeon: Wilford Corner, MD;  Location: [facility];  Service: Gastroenterology;  Laterality: Left;  needs ultraslim colonoscope  . ESOPHAGOGASTRODUODENOSCOPY N/A 06/15/2013   Procedure: ESOPHAGOGASTRODUODENOSCOPY (EGD);  Surgeon: [name], MD;  Location: [facility];  Service: Endoscopy;  Laterality: N/A;  . ESOPHAGOGASTRODUODENOSCOPY (EGD) WITH PROPOFOL N/A 05/24/2019   Procedure: ESOPHAGOGASTRODUODENOSCOPY (EGD) WITH PROPOFOL;  Surgeon: Ronald Lobo, MD;  Location: [facility];  Service: Endoscopy;  Laterality: N/A;  . IR GENERIC HISTORICAL  10/15/2016   IR KYPHO LUMBAR INC FX REDUCE BONE BX UNI/BIL CANNULATION INC/IMAGING 10/15/2016 Luanne Bras, MD [facility]-INTER[facility] RAD  . POLYPECTOMY  03/20/2020   Procedure: POLYPECTOMY;  Surgeon: Wilford Corner, MD;  Location: [facility] ENDOSCOPY;  Service: Gastroenterology;;  . sacroplasty  06-14-13   05-10-13 IVR [facility] for fracture stabilization    Family History  Problem Relation Age of Onset  . Hypertension Sister   . Diabetes Mellitus II Sister     Social History:  reports that she quit smoking about 8 years ago. Her smoking use included cigarettes. She has never used smokeless tobacco. She reports that she does not drink alcohol or use drugs.  Allergies:  Allergies  Allergen Reactions  . Ambien [Zolpidem] Other (See Comments)    Hallucinations and paranoid  . Codeine Hives and Nausea And Vomiting    Medications: I have reviewed the patient's current medications.  Results for orders placed or performed during the hospital encounter of 04/14/20 (from the past 48 hour(s))  Type and screen [place]     Status: None (Preliminary result)   Collection Time: 04/14/20 10:42 PM  Result Value Ref Range   ABO/RH(D) O POS    Antibody Screen NEG    Sample Expiration 04/17/2020,2359    Unit Number [id number]    Blood Component Type RED CELLS,LR    Unit division 00    Status of Unit ISSUED    Transfusion Status OK TO TRANSFUSE    Crossmatch Result      Compatible Performed at [facility], [address] [place], [place] [address]   Comprehensive metabolic panel     Status: Abnormal   Collection Time: 04/14/20 10:49 PM  Result Value Ref Range   Sodium 140  135 - 145 mmol/L   Potassium 4.9 3.5 - 5.1 mmol/L   Chloride 100 98 - 111 mmol/L   CO2 31 22 - 32 mmol/L   Glucose, Bld 117 (H) 70 - 99 mg/dL    Comment: Glucose reference range applies only to samples taken after fasting for at least 8 hours.   BUN 28 (H) 8 - 23 mg/dL   Creatinine, Ser 0.67 0.44 - 1.00 mg/dL   Calcium 8.5 (L) 8.9 - 10.3 mg/dL   Total Protein 6.9 6.5 - 8.1 g/dL   Albumin 2.7 (L) 3.5 - 5.0 g/dL   AST 13 (L) 15 - 41 U/L   ALT 10 0 - 44 U/L    Alkaline Phosphatase 86 38 - 126 U/L   Total Bilirubin 0.2 (L) 0.3 - 1.2 mg/dL   GFR calc non Af Amer >60 >60 mL/min   GFR calc Af Amer >60 >60 mL/min   Anion gap 9 5 - 15    Comment: Performed at [facility] [address], [place], [place] [address]  CBC WITH DIFFERENTIAL     Status: Abnormal   Collection Time: 04/14/20 10:49 PM  Result Value Ref Range   WBC 9.7 4.0 - 10.5 K/uL   RBC 2.77 (L) 3.87 - 5.11 MIL/uL   Hemoglobin 7.0 (L) 12.0 - 15.0 g/dL   HCT 25.5 (L) 36.0 - 46.0 %   MCV 92.1 80.0 - 100.0 fL   MCH 25.3 (L) 26.0 - 34.0 pg   MCHC 27.5 (L) 30.0 - 36.0 g/dL   RDW 19.9 (H) 11.5 - 15.5 %   Platelets 768 (H) 150 - 400 K/uL   nRBC 0.0 0.0 - 0.2 %   Neutrophils Relative % 65 %   Neutro Abs 6.3 1.7 - 7.7 K/uL   Lymphocytes Relative 21 %   Lymphs Abs 2.0 0.7 - 4.0 K/uL   Monocytes Relative 8 %   Monocytes Absolute 0.8 0.1 - 1.0 K/uL   Eosinophils Relative 4 %   Eosinophils Absolute 0.4 0.0 - 0.5 K/uL   Basophils Relative 1 %   Basophils Absolute 0.1 0.0 - 0.1 K/uL   Immature Granulocytes 1 %   Abs Immature Granulocytes 0.09 (H) 0.00 - 0.07 K/uL    Comment: Performed at [facility], [place] [address] [place], [place] [address]  Protime-INR     Status: None   Collection Time: 04/14/20 10:49 PM  Result Value Ref Range   Prothrombin Time 12.3 11.4 - 15.2 seconds   INR 0.9 0.8 - 1.2    Comment: (NOTE) INR goal varies based on device and disease states. Performed at [facility], [place] [address] [place],  [address]   POC occult blood, ED Provider will collect     Status: Abnormal   Collection Time: 04/15/20 12:33 AM  Result Value Ref Range   Fecal Occult Bld POSITIVE (A) NEGATIVE  Prepare RBC (crossmatch)     Status: None   Collection Time: 04/15/20 12:34 AM  Result Value Ref Range   Order Confirmation      ORDER PROCESSED BY BLOOD BANK Performed at [facility], [place] [address] [place], [place] [address]   SARS CORONAVIRUS 2 (TAT 6-24  HRS) Nasopharyngeal Nasopharyngeal Swab     Status: None   Collection Time: 04/15/20 12:47 AM   Specimen: Nasopharyngeal Swab  Result Value Ref Range   SARS Coronavirus 2 NEGATIVE NEGATIVE    Comment: (NOTE) SARS-CoV-2 target nucleic acids  are NOT DETECTED. The SARS-CoV-2 RNA is generally detectable in upper and lower respiratory specimens during the acute phase of infection. Negative results do not preclude SARS-CoV-2 infection, do not rule out co-infections with other pathogens, and should not be used as the sole basis for treatment or other patient management decisions. Negative results must be combined with clinical observations, patient history, and epidemiological information. The expected result is Negative. Fact Sheet for Patients: SugarRoll.be Fact Sheet for Healthcare Providers: https://www.woods-mathews.com/ This test is not yet approved or cleared by the [place] FDA and  has been authorized for detection and/or diagnosis of SARS-CoV-2 by FDA under an Emergency Use Authorization (EUA). This EUA will remain  in effect (meaning this test can be used) for the duration of the COVID-19 declaration under Section 56 4(b)(1) of the Act, 21 U.S.C. section 360bbb-3(b)(1), unless the authorization is terminated or revoked sooner. Performed at [facility], [place] [address] [place], [place] [id number]   Basic metabolic panel     Status: Abnormal   Collection Time: 04/15/20  6:06 AM  Result Value Ref Range   Sodium 138 135 - 145 mmol/L   Potassium 4.0 3.5 - 5.1 mmol/L   Chloride 99 98 - 111 mmol/L   CO2 29 22 - 32 mmol/L   Glucose, Bld 124 (H) 70 - 99 mg/dL    Comment: Glucose reference range applies only to samples taken after fasting for at least 8 hours.   BUN 24 (H) 8 - 23 mg/dL   Creatinine, Ser 0.68 0.44 - 1.00 mg/dL   Calcium 8.3 (L) 8.9 - 10.3 mg/dL   GFR calc non Af Amer >60 >60 mL/min   GFR calc Af Amer >60 >60 mL/min    Anion gap 10 5 - 15    Comment: Performed at [facility] [address] [place], [place] [address]  CBC     Status: Abnormal   Collection Time: 04/15/20  6:06 AM  Result Value Ref Range   WBC 10.5 4.0 - 10.5 K/uL   RBC 3.17 (L) 3.87 - 5.11 MIL/uL   Hemoglobin 7.6 (L) 12.0 - 15.0 g/dL    Comment: REPEATED TO VERIFY POST TRANSFUSION SPECIMEN    HCT 26.3 (L) 36.0 - 46.0 %   MCV 83.0 80.0 - 100.0 fL    Comment: REPEATED TO VERIFY POST TRANSFUSION SPECIMEN    MCH 24.0 (L) 26.0 - 34.0 pg   MCHC 28.9 (L) 30.0 - 36.0 g/dL   RDW Not Measured 11.5 - 15.5 %   Platelets 648 (H) 150 - 400 K/uL   nRBC 0.0 0.0 - 0.2 %    Comment: Performed at [facility], [place] [address] [place] [address], [place], [place] [address]    No results found.  Review of Systems negative except above Blood pressure (!) 129/49, pulse 78, temperature 98.4 F (36.9 C), temperature source Oral, resp. rate 17, SpO2 100 %. Physical Exam vital signs stable afebrile no acute distress exam pertinent for abdomen being soft nontender recent CT and labs reviewed BUN up slightly over baseline  Assessment/Plan: Multiple medical problems including bright red blood per rectum and slight decrease hemoglobin Plan: Hold work-up for now call us back if signs of significant active bleeding otherwise consider hemorrhoidal suppositories with 25 g HC and transfuse as needed and reevaluate aspirin requirements and one of my partners happy to see back as an outpatient if needed  [facility] 04/15/2020, 12:59 PM

## 2020-04-15 NOTE — ED Notes (Signed)
Pt unable to perform orthostatic VS at this time. Pt unable to stand and is nauseous with dry heaves.

## 2020-04-15 NOTE — Plan of Care (Signed)
  Problem: Education: Goal: Knowledge of General Education information will improve Description: Including pain rating scale, medication(s)/side effects and non-pharmacologic comfort measures Outcome: Progressing   Problem: Clinical Measurements: Goal: Ability to maintain clinical measurements within normal limits will improve Outcome: Progressing Goal: Diagnostic test results will improve Outcome: Progressing Goal: Respiratory complications will improve Outcome: Progressing Goal: Cardiovascular complication will be avoided Outcome: Progressing   Problem: Nutrition: Goal: Adequate nutrition will be maintained Outcome: Progressing   Problem: Pain Managment: Goal: General experience of comfort will improve Outcome: Progressing   Problem: Skin Integrity: Goal: Risk for impaired skin integrity will decrease Outcome: Progressing

## 2020-04-15 NOTE — Plan of Care (Signed)
  Problem: Clinical Measurements: Goal: Ability to maintain clinical measurements within normal limits will improve Outcome: Progressing   Problem: Clinical Measurements: Goal: Respiratory complications will improve Outcome: Progressing   Problem: Clinical Measurements: Goal: Cardiovascular complication will be avoided Outcome: Progressing   

## 2020-04-15 NOTE — Progress Notes (Signed)
Patient ID: [name], female   DOB: [birth year], 84 y.o.   MRN: [id number]   Subjective:  Patient evaluated at bedside. She notes feeling well currently but notes that she has not been feeling well over past 6-7 months after her stroke. She expresses concern regarding her health. She notes that she is unable to walk or function since her stroke. Has had significant functional decline.  She notes her current concern is the red blood per rectum. She has never had anything like this previously. She notes frequent bowel movements with Miralax but no pain with bowel movements. Ongoing back pain from osteoporosis.  Patient complains of dysuria, but no increase in frequency.   Objective:  Vital signs in last 24 hours: Vitals:   04/15/20 0437 04/15/20 0445 04/15/20 0532 04/15/20 0607  BP: (!) 120/45 (!) 124/52 (!) 121/49 (!) 126/59  Pulse: 83 94 93 90  Resp: 16 16 17 14   Temp:      TempSrc:      SpO2: 98% 99% 96% 100%   Weight change:   Intake/Output Summary (Last 24 hours) at 04/15/2020 0629 Last data filed at 04/15/2020 0346 Gross per 24 hour  Intake 945 ml  Output --  Net 945 ml   Physical Exam  Cardiovascular: Normal rate, regular rhythm, normal heart sounds and intact distal pulses.  Respiratory: Effort normal and breath sounds normal.  GI: Soft. Bowel sounds are normal. She exhibits no distension. There is no abdominal tenderness.  Neurological: She is alert.  Skin: Skin is warm and dry.  Psychiatric: She has a normal mood and affect.   CBC Latest Ref Rng & Units 04/15/2020 04/14/2020 03/20/2020  WBC 4.0 - 10.5 K/uL 10.5 9.7 7.2  Hemoglobin 12.0 - 15.0 g/dL 7.6(L) 7.0(L) 8.8(L)  Hematocrit 36.0 - 46.0 % 26.3(L) 25.5(L) 30.1(L)  Platelets 150 - 400 K/uL 648(H) 768(H) 696(H)   BMP Latest Ref Rng & Units 04/15/2020 04/14/2020 03/20/2020  Glucose 70 - 99 mg/dL 124(H) 117(H) 86  BUN 8 - 23 mg/dL 24(H) 28(H) 8  Creatinine 0.44 - 1.00 mg/dL 0.68 0.67 0.57  Sodium 135 - 145 mmol/L  138 140 140  Potassium 3.5 - 5.1 mmol/L 4.0 4.9 4.1  Chloride 98 - 111 mmol/L 99 100 102  CO2 22 - 32 mmol/L 29 31 28   Calcium 8.9 - 10.3 mg/dL 8.3(L) 8.5(L) 8.5(L)    Assessment/Plan:  Active Problems:   GIB (gastrointestinal bleeding)  Summary:  [name] is a 84 year old female with a history of COPD, CVA, esophagitis, gastritis, and prior GI bleeds who presented with BRBPR and was admitted for symptomatic anemia due to GI bleeding.    GI bleed; symptomatic anemia:  The patient was recently admitted with melena with unremarkable workup. Melena self resolved and patient was discharged and recommended to follow up with GI as outpatient next week. However, she has been weak and had intermittent hematochezia since discharge and then yesterday noticed a significant amount more of blood when wiping after a BM. She had a Hgb of 7 on admission and was administered 1 Unit  pRBC, with a post transfusion Hgb of 7.6. She a history of gastritis and esophagitis and is prescribed a PPI that she has not been taking which is concerning for an upper GI bleed. However, considering her presentation with hematochezia rather than melena there is also concern for a lower GI bleed. Lower GI bleeding could be from diverticula or hemorrhoids identified on previous colonoscopy. GI consulted; we appreciate  all recommendations.   - daily CBC - protonix 8 mg/hr for 72 hours - protonix 40 mg IV BID - telemetry   Pressure ulcer:  On admission it was noted that the patient has a new pressure ulcer on her right buttocks. WOC was consulted in ED and determined it to be a stage III ulcer. A silicone foam dressing was in place and was augmented with xeroform dressing. We appreciate their recommendations.  - Nursing staff to change xeroform BID and position patient off the lesion - heels will be floated while in bed or chair - Toradol 15 mg for pain Q8H PRN   Diet: regular diet VTE ppx: SCDs; no lovenox due GI  bleed Code: full  Dispo: pending further medical workup    LOS: 0 days   Mikael Spray, Medical Student 04/15/2020, 6:29 AM

## 2020-04-15 NOTE — ED Notes (Signed)
IM paged to [place], RN paged by Levada Dy

## 2020-04-15 NOTE — ED Notes (Signed)
Mepilex patch placed on pts right buttocks pressure injury.

## 2020-04-15 NOTE — Consult Note (Signed)
WOC Nurse Consult Note: Reason for Consult: Stage 3 Pressure Injury to Right buttock Wound type:Pressure Pressure Injury POA: Yes Measurement: 3.8cm x 1.5cm x 0.2cm Wound [id number] pink, dry Drainage (amount, consistency, odor) None Periwound: erythematous, dry, peeling Dressing procedure/placement/frequency:Patient has a silicone foam dressing in place.  I will augment this with a xeroform dressing used as a wound contact layer for its antimicrobial properties. Nursing staff will change the xeroform twice daily and position patient off of the lesion. Heels will be floated while in bed or chair.  [place] nursing team will not follow, but will remain available to this patient, the nursing and medical teams.  Please re-consult if needed. Thanks, Maudie Flakes, MSN, RN, [place], [facility]  Pager# [phone]

## 2020-04-15 NOTE — H&P (Signed)
Date: 04/15/2020               Patient Name:  [name] MRN: [id number]  DOB: 1936/04/16 Age / Sex: 84 y.o., female   PCP: [name], MD         Medical Service: Internal Medicine Teaching Service         Attending Physician: Dr. Rebeca Alert Raynaldo Opitz, MD    First Contact: Dr. Sheppard Coil Pager: [phone]  Second Contact: Dr. Myrtie Hawk Pager: [phone]       After Hours (After 5p/  First Contact Pager: [phone]  weekends / holidays): Second Contact Pager: [phone]   Chief Complaint: BRBPR  History of Present Illness: [name] is an 83 yo F w/ a PMHx of CVA, HTN, COPD, Depression, esophagitis, gastritis and prior episodes of GI bleeding with unremarkable prior work up here with BRBPR.  She reports having intermittent episodes of BRBPR since her discharge from the hospital. Some days she would have it, some days she wouldn't. Today there was more blood than usual. She reports bright red blood on the tissue paper. There is no blood in the toilet bowl or her stool. Today it soaked through the tissue paper. She denies rectal pain or constipation. She denies abdominal pain but reports nausea since her last admission. She has been adherent to the miralax but not her PPI. She says sometimes the miralax causes diarrhea. She denies fevers, chills, CP, palpitations, SOB, hematemesis, falls, lightheadedness and LOC. She also reports a new wound on her bottom.   Meds:  Current Meds  Medication Sig  . polyethylene glycol (MIRALAX / GLYCOLAX) 17 g packet Take 17 g by mouth daily.  Marland Kitchen senna-docusate (SENOKOT-S) 8.6-50 MG tablet Take 2 tablets by mouth 2 (two) times daily. (Patient taking differently: Take 2 tablets by mouth 2 (two) times daily as needed for mild constipation. )  says she only takes aspirin and miralax  Allergies: Allergies as of 04/14/2020 - Review Complete 03/20/2020  Allergen Reaction Noted  . Ambien [zolpidem] Other (See Comments) 12/20/2015  . Codeine Hives and  Nausea And Vomiting 06/18/2019   Past Medical History:  Diagnosis Date  . Arthritis   . CAP (community acquired pneumonia)    multiple admissions  . Chronic back pain 08/22/2018  . Colitis 03/19/2014  . Compression fx, lumbar spine ([place])   . COPD (chronic obstructive pulmonary disease) ([place])   . Delirium   . Hypertension   . On home oxygen therapy 06-14-13   continuos 2.5l/m nasally-24/7  . Osteoporosis   . Sacral fracture ([place]) 2014   Past Surgical History:  Procedure Laterality Date  . ABDOMINAL HYSTERECTOMY    . BIOPSY  05/24/2019   Procedure: BIOPSY;  Surgeon: Ronald Lobo, MD;  Location: [facility];  Service: Endoscopy;;  . COLONOSCOPY WITH PROPOFOL Left 03/20/2020   Procedure: COLONOSCOPY WITH PROPOFOL;  Surgeon: Wilford Corner, MD;  Location: [facility];  Service: Gastroenterology;  Laterality: Left;  needs ultraslim colonoscope  . ESOPHAGOGASTRODUODENOSCOPY N/A 06/15/2013   Procedure: ESOPHAGOGASTRODUODENOSCOPY (EGD);  Surgeon: [name], MD;  Location: [facility];  Service: Endoscopy;  Laterality: N/A;  . ESOPHAGOGASTRODUODENOSCOPY (EGD) WITH PROPOFOL N/A 05/24/2019   Procedure: ESOPHAGOGASTRODUODENOSCOPY (EGD) WITH PROPOFOL;  Surgeon: Ronald Lobo, MD;  Location: [facility];  Service: Endoscopy;  Laterality: N/A;  . IR GENERIC HISTORICAL  10/15/2016   IR KYPHO LUMBAR INC FX REDUCE BONE BX UNI/BIL CANNULATION INC/IMAGING 10/15/2016 Luanne Bras, MD [facility]-INTER[facility] RAD  . POLYPECTOMY  03/20/2020  Procedure: POLYPECTOMY;  Surgeon: Wilford Corner, MD;  Location: [facility] ENDOSCOPY;  Service: Gastroenterology;;  . sacroplasty  06-14-13   05-10-13 IVR CONE for fracture stabilization    Family History:  Family History  Problem Relation Age of Onset  . Hypertension Sister   . Diabetes Mellitus II Sister    Social History:  Social History   Tobacco Use  . Smoking status: Former Smoker    Types: Cigarettes    Quit date: 06/15/2011    Years since  quitting: 8.8  . Smokeless tobacco: Never Used  Substance Use Topics  . Alcohol use: No  . Drug use: No  lives with her son Does not smoke cigarettes any longer  Review of Systems: A complete ROS was negative except as per HPI.   Physical Exam: Blood pressure (!) 141/64, pulse (!) 104, temperature 97.6 F (36.4 C), temperature source Oral, resp. rate 20, SpO2 100 %.  Physical Exam  Constitutional:  Thin, elderly female  HENT:  Head: Normocephalic and atraumatic.  Eyes: EOM are normal.  Cardiovascular: Regular rhythm and normal heart sounds.  No murmur heard. tachy  Pulmonary/Chest: Effort normal. No respiratory distress.  Basilar crackles  Abdominal: Soft. Bowel sounds are normal. She exhibits no distension.  Genitourinary:    Genitourinary Comments: Visible external hemorrhoids on rectal exam without visible or palpable masses; soft brown stool in rectal vault   Musculoskeletal:        General: No deformity or edema.     Cervical back: Normal range of motion.  Neurological: She is alert.  Skin: Skin is warm and dry. No erythema.   Labs:  Results for orders placed or performed during the hospital encounter of 04/14/20 (from the past 24 hour(s))  Type and screen [place]     Status: None (Preliminary result)   Collection Time: 04/14/20 10:42 PM  Result Value Ref Range   ABO/RH(D) O POS    Antibody Screen NEG    Sample Expiration 04/17/2020,2359    Unit Number [id number]    Blood Component Type RED CELLS,LR    Unit division 00    Status of Unit ISSUED    Transfusion Status OK TO TRANSFUSE    Crossmatch Result      Compatible Performed at [facility], [address] [place], [place] [address]   Comprehensive metabolic panel     Status: Abnormal   Collection Time: 04/14/20 10:49 PM  Result Value Ref Range   Sodium 140 135 - 145 mmol/L   Potassium 4.9 3.5 - 5.1 mmol/L   Chloride 100 98 - 111 mmol/L   CO2 31 22 - 32 mmol/L    Glucose, Bld 117 (H) 70 - 99 mg/dL   BUN 28 (H) 8 - 23 mg/dL   Creatinine, Ser 0.67 0.44 - 1.00 mg/dL   Calcium 8.5 (L) 8.9 - 10.3 mg/dL   Total Protein 6.9 6.5 - 8.1 g/dL   Albumin 2.7 (L) 3.5 - 5.0 g/dL   AST 13 (L) 15 - 41 U/L   ALT 10 0 - 44 U/L   Alkaline Phosphatase 86 38 - 126 U/L   Total Bilirubin 0.2 (L) 0.3 - 1.2 mg/dL   GFR calc non Af Amer >60 >60 mL/min   GFR calc Af Amer >60 >60 mL/min   Anion gap 9 5 - 15  CBC WITH DIFFERENTIAL     Status: Abnormal   Collection Time: 04/14/20 10:49 PM  Result Value Ref Range   WBC  9.7 4.0 - 10.5 K/uL   RBC 2.77 (L) 3.87 - 5.11 MIL/uL   Hemoglobin 7.0 (L) 12.0 - 15.0 g/dL   HCT 25.5 (L) 36.0 - 46.0 %   MCV 92.1 80.0 - 100.0 fL   MCH 25.3 (L) 26.0 - 34.0 pg   MCHC 27.5 (L) 30.0 - 36.0 g/dL   RDW 19.9 (H) 11.5 - 15.5 %   Platelets 768 (H) 150 - 400 K/uL   nRBC 0.0 0.0 - 0.2 %   Neutrophils Relative % 65 %   Neutro Abs 6.3 1.7 - 7.7 K/uL   Lymphocytes Relative 21 %   Lymphs Abs 2.0 0.7 - 4.0 K/uL   Monocytes Relative 8 %   Monocytes Absolute 0.8 0.1 - 1.0 K/uL   Eosinophils Relative 4 %   Eosinophils Absolute 0.4 0.0 - 0.5 K/uL   Basophils Relative 1 %   Basophils Absolute 0.1 0.0 - 0.1 K/uL   Immature Granulocytes 1 %   Abs Immature Granulocytes 0.09 (H) 0.00 - 0.07 K/uL  Protime-INR     Status: None   Collection Time: 04/14/20 10:49 PM  Result Value Ref Range   Prothrombin Time 12.3 11.4 - 15.2 seconds   INR 0.9 0.8 - 1.2  POC occult blood, ED Provider will collect     Status: Abnormal   Collection Time: 04/15/20 12:33 AM  Result Value Ref Range   Fecal Occult Bld POSITIVE (A) NEGATIVE  Prepare RBC (crossmatch)     Status: None   Collection Time: 04/15/20 12:34 AM  Result Value Ref Range   Order Confirmation      ORDER PROCESSED BY BLOOD BANK Performed at [facility], [address], [place], [place] [address]     No results found.  Assessment:  Ms. Mcrobie is an 84 yo F w/ a PMHx of CVA, HTN, COPD,  Depression, esophagitis, gastritis and prior episodes of GI bleeding with unremarkable prior work up here with BRBPR with a Hgb of 7 here for medical workup and management.   Plan by Problem:  BRBPR: Anemia: -pt w/ hx esophagitis and gastritis recently admitted with melena with unremarkable work up; no acute blood on colonoscopy but diverticula and hemorrhoids present as well as one polyp which came back as tubular adenoma without high grade dysplasia or carcinoma -GI bleeding self resolved and pt recommended to follow up with GI outpatient  -pt coming in today with intermittent episodes of BRBPR, more today than usual, and found to have hgb of 7 -hgb down to 4 at presentation of last admission with final hgb of 8.8 prior to discharge -anemia workup consistent acute on chronic blood loss and iron deficiency -pt reports weakness since her prior hospitalization but otherwise does not report symptomatic anemia: denies CP, palps, lightheadedness and LOC -external hemorrhoid visible on rectal exam; no protruding or palpable masses; soft brown stool in rectal vault; no obvious blood or active bleeding; FOBT + -pt appears well and HDS -thought to be a lower GI bleed initially until pt stated she was only taking aspirin and miralax and was not taking her PPI despite her hx of upper GI inflammation  Plan: -blood transfusion -fu post transfusion H&H -consider GI consult in AM -continue B12 -continue bolus and drip -NPO  Thrymbocytosis: -pt w/ hx of thrombocytosis here with platelet count of 768, previously thought to be partially due to blood loss and iron deficiency anemias, however, reactivity thought to be an unlikely cause of such a high increase -  JAK2 genotyping normal -plan was for continued outpatient work up -unlikely to be contributing to any of her current complaints  Plan: -monitor with CBC  HTN: -reports she is not currently taking amlodipine -blood pressure stable on  presentation  COPD: -continue albuterol and aeroChamber plus flo-vu -continue home O2  Constipation: -hold miralax while NPO  Active Problems:   GIB (gastrointestinal bleeding)  Dispo: Admit patient to Observation with expected length of stay less than 2 midnights.  Patient has signed DNR form, however, she was adamant that she would like to be resuscitated and intubated if necessary.  Signed: Al Decant, MD 04/15/2020, 2:05 AM  Pager: [phone]

## 2020-04-15 NOTE — ED Notes (Signed)
IM paged to [place], RN paged by Levada Dy

## 2020-04-16 ENCOUNTER — Observation Stay (HOSPITAL_COMMUNITY): Payer: Medicare Other

## 2020-04-16 DIAGNOSIS — K922 Gastrointestinal hemorrhage, unspecified: Secondary | ICD-10-CM | POA: Diagnosis present

## 2020-04-16 DIAGNOSIS — G4700 Insomnia, unspecified: Secondary | ICD-10-CM

## 2020-04-16 DIAGNOSIS — Z20822 Contact with and (suspected) exposure to covid-19: Secondary | ICD-10-CM | POA: Diagnosis present

## 2020-04-16 DIAGNOSIS — Z90710 Acquired absence of both cervix and uterus: Secondary | ICD-10-CM

## 2020-04-16 DIAGNOSIS — K648 Other hemorrhoids: Secondary | ICD-10-CM | POA: Diagnosis present

## 2020-04-16 DIAGNOSIS — R300 Dysuria: Secondary | ICD-10-CM | POA: Diagnosis not present

## 2020-04-16 DIAGNOSIS — N3090 Cystitis, unspecified without hematuria: Secondary | ICD-10-CM | POA: Diagnosis present

## 2020-04-16 DIAGNOSIS — L89313 Pressure ulcer of right buttock, stage 3: Secondary | ICD-10-CM

## 2020-04-16 DIAGNOSIS — D649 Anemia, unspecified: Secondary | ICD-10-CM | POA: Diagnosis not present

## 2020-04-16 DIAGNOSIS — K209 Esophagitis, unspecified without bleeding: Secondary | ICD-10-CM | POA: Diagnosis present

## 2020-04-16 DIAGNOSIS — M810 Age-related osteoporosis without current pathological fracture: Secondary | ICD-10-CM | POA: Diagnosis present

## 2020-04-16 DIAGNOSIS — Z66 Do not resuscitate: Secondary | ICD-10-CM | POA: Diagnosis present

## 2020-04-16 DIAGNOSIS — Z8249 Family history of ischemic heart disease and other diseases of the circulatory system: Secondary | ICD-10-CM | POA: Diagnosis not present

## 2020-04-16 DIAGNOSIS — Z9981 Dependence on supplemental oxygen: Secondary | ICD-10-CM | POA: Diagnosis not present

## 2020-04-16 DIAGNOSIS — M549 Dorsalgia, unspecified: Secondary | ICD-10-CM | POA: Diagnosis present

## 2020-04-16 DIAGNOSIS — K2970 Gastritis, unspecified, without bleeding: Secondary | ICD-10-CM | POA: Diagnosis present

## 2020-04-16 DIAGNOSIS — K640 First degree hemorrhoids: Secondary | ICD-10-CM | POA: Diagnosis present

## 2020-04-16 DIAGNOSIS — G8929 Other chronic pain: Secondary | ICD-10-CM | POA: Diagnosis present

## 2020-04-16 DIAGNOSIS — Z833 Family history of diabetes mellitus: Secondary | ICD-10-CM | POA: Diagnosis not present

## 2020-04-16 DIAGNOSIS — B964 Proteus (mirabilis) (morganii) as the cause of diseases classified elsewhere: Secondary | ICD-10-CM | POA: Diagnosis present

## 2020-04-16 DIAGNOSIS — J449 Chronic obstructive pulmonary disease, unspecified: Secondary | ICD-10-CM | POA: Diagnosis present

## 2020-04-16 DIAGNOSIS — Z8673 Personal history of transient ischemic attack (TIA), and cerebral infarction without residual deficits: Secondary | ICD-10-CM | POA: Diagnosis not present

## 2020-04-16 DIAGNOSIS — Z90722 Acquired absence of ovaries, bilateral: Secondary | ICD-10-CM

## 2020-04-16 DIAGNOSIS — Z87891 Personal history of nicotine dependence: Secondary | ICD-10-CM | POA: Diagnosis not present

## 2020-04-16 DIAGNOSIS — I10 Essential (primary) hypertension: Secondary | ICD-10-CM | POA: Diagnosis present

## 2020-04-16 DIAGNOSIS — J9610 Chronic respiratory failure, unspecified whether with hypoxia or hypercapnia: Secondary | ICD-10-CM | POA: Diagnosis present

## 2020-04-16 DIAGNOSIS — Z7982 Long term (current) use of aspirin: Secondary | ICD-10-CM | POA: Diagnosis not present

## 2020-04-16 DIAGNOSIS — K5730 Diverticulosis of large intestine without perforation or abscess without bleeding: Secondary | ICD-10-CM | POA: Diagnosis present

## 2020-04-16 DIAGNOSIS — K5289 Other specified noninfective gastroenteritis and colitis: Secondary | ICD-10-CM | POA: Diagnosis present

## 2020-04-16 LAB — CBC
HCT: 27.1 % — ABNORMAL LOW (ref 36.0–46.0)
Hemoglobin: 7.7 g/dL — ABNORMAL LOW (ref 12.0–15.0)
MCH: 23.8 pg — ABNORMAL LOW (ref 26.0–34.0)
MCHC: 28.4 g/dL — ABNORMAL LOW (ref 30.0–36.0)
MCV: 83.6 fL (ref 80.0–100.0)
Platelets: 617 10*3/uL — ABNORMAL HIGH (ref 150–400)
RBC: 3.24 MIL/uL — ABNORMAL LOW (ref 3.87–5.11)
RDW: 25.2 % — ABNORMAL HIGH (ref 11.5–15.5)
WBC: 12.5 10*3/uL — ABNORMAL HIGH (ref 4.0–10.5)
nRBC: 0 % (ref 0.0–0.2)

## 2020-04-16 LAB — URINALYSIS, ROUTINE W REFLEX MICROSCOPIC
Bilirubin Urine: NEGATIVE
Glucose, UA: NEGATIVE mg/dL
Ketones, ur: NEGATIVE mg/dL
Nitrite: POSITIVE — AB
Protein, ur: 30 mg/dL — AB
Specific Gravity, Urine: 1.018 (ref 1.005–1.030)
WBC, UA: >50 WBC/hpf — ABNORMAL HIGH (ref 0–5)
pH: 7 (ref 5.0–8.0)

## 2020-04-16 LAB — BPAM RBC
Blood Product Expiration Date: 202105182359
ISSUE DATE / TIME: 202104190055
Unit Type and Rh: 5100

## 2020-04-16 LAB — TYPE AND SCREEN
ABO/RH(D): O POS
Antibody Screen: NEGATIVE
Unit division: 0

## 2020-04-16 LAB — BASIC METABOLIC PANEL
Anion gap: 14 (ref 5–15)
BUN: 28 mg/dL — ABNORMAL HIGH (ref 8–23)
CO2: 24 mmol/L (ref 22–32)
Calcium: 8.2 mg/dL — ABNORMAL LOW (ref 8.9–10.3)
Chloride: 100 mmol/L (ref 98–111)
Creatinine, Ser: 0.88 mg/dL (ref 0.44–1.00)
GFR calc Af Amer: >60 mL/min (ref >60)
GFR calc non Af Amer: >60 mL/min (ref >60)
Glucose, Bld: 148 mg/dL — ABNORMAL HIGH (ref 70–99)
Potassium: 4.6 mmol/L (ref 3.5–5.1)
Sodium: 138 mmol/L (ref 135–145)

## 2020-04-16 MED ORDER — RAMELTEON 8 MG PO TABS
8.0000 mg | ORAL_TABLET | Freq: Every day | ORAL | Status: DC
  Administered 2020-04-16 – 2020-04-17 (×3): 8 mg via ORAL
  Filled 2020-04-16 (×4): qty 1

## 2020-04-16 MED ORDER — ASPIRIN EC 81 MG PO TBEC
81.0000 mg | DELAYED_RELEASE_TABLET | Freq: Every day | ORAL | Status: DC
  Administered 2020-04-16 – 2020-04-18 (×3): 81 mg via ORAL
  Filled 2020-04-16 (×3): qty 1

## 2020-04-16 NOTE — Progress Notes (Signed)
Patient ID: [name], female   DOB: 1936-01-24, 84 y.o.   MRN: [id number]   Subjective: HD: 1  Pt seen at the bedside this AM. She states that she did not see the GI doctors yesterday. We discussed how the GI doctors think she may be having some bleeding from her hemorrhoids. The patient says she thinks she is actually bleeding from her vagina. She said that she had a partial hysterectomy in the past, but her chart says she had a total hysterectomy and bilateral salpingo-oophorectomy in the 1970s.  She states that she is unaware if she has had any further bleeding since hospitalization, but that she has not noticed any.  She does mention burning with urination as well as insomnia.   Objective:  Vital signs in last 24 hours: Vitals:   04/15/20 1330 04/15/20 1345 04/15/20 1516 04/15/20 2332  BP: (!) 138/51 (!) 138/51 (!) 138/59 (!) 156/65  Pulse: 74 78 85 81  Resp: 17 19    Temp:   97.9 F (36.6 C) 97.7 F (36.5 C)  TempSrc:   Oral Oral  SpO2: 100% 100% 98% 96%   Weight change:   Intake/Output Summary (Last 24 hours) at 04/16/2020 0649 Last data filed at 04/16/2020 0348 Gross per 24 hour  Intake 816.51 ml  Output --  Net 816.51 ml   Physical Exam  Cardiovascular: Normal rate, regular rhythm and normal heart sounds.  Respiratory: Effort normal and breath sounds normal.  GI: Soft. There is no abdominal tenderness.  Neurological: She is alert.  Skin: Skin is warm and dry.  Psychiatric: Her mood appears anxious.   CBC Latest Ref Rng & Units 04/16/2020 04/15/2020 04/14/2020  WBC 4.0 - 10.5 K/uL 12.5(H) 10.5 9.7  Hemoglobin 12.0 - 15.0 g/dL 7.7(L) 7.6(L) 7.0(L)  Hematocrit 36.0 - 46.0 % 27.1(L) 26.3(L) 25.5(L)  Platelets 150 - 400 K/uL 617(H) 648(H) 768(H)   BMP Latest Ref Rng & Units 04/16/2020 04/15/2020 04/14/2020  Glucose 70 - 99 mg/dL 148(H) 124(H) 117(H)  BUN 8 - 23 mg/dL 28(H) 24(H) 28(H)  Creatinine 0.44 - 1.00 mg/dL 0.88 0.68 0.67  Sodium 135 - 145 mmol/L 138 138 140   Potassium 3.5 - 5.1 mmol/L 4.6 4.0 4.9  Chloride 98 - 111 mmol/L 100 99 100  CO2 22 - 32 mmol/L 24 29 31   Calcium 8.9 - 10.3 mg/dL 8.2(L) 8.3(L) 8.5(L)   Assessment/Plan:  Principal Problem:   GIB (gastrointestinal bleeding) Active Problems:   Hematochezia   COPD (chronic obstructive pulmonary disease) (HCC)   Chronic respiratory failure (HCC)   Acute blood loss anemia  Summary:  [name] is a 84 year old female with a history of CVA, COPD, esophagitis, gastritis, internal hemorrhoids and previous GI bleeding admitted for symptomatic anemia due to GI bleeding.   GI bleeding:  The patient had Hgb of 7.0 on admission with reports of BRBPR intermittently since prior discharge last month. Her Hgb has remained stable since transfusion yesterday morning and is now 7.7. She has not noticed any further bleeding and nursing staff has not noted any bleeding either. GI saw the patient yesterday and recommend hemorrhoidal suppository and outpatient follow up.  - hemorrhoidal suppository with 25 g HC - restart aspirin given history of CVA and low concern for ulcer as source of bleeding - continue protonix - daily CBC  Possible vaginal bleeding:  The patient expressed concern today that her bleeding is originating from her vagina. She does have a history of total hysterectomy and  bilateral salpingo-oophorectomy in the 1970s though. Given this history as well as positive FOBT in ED as well as known hemorrhoids and diverticula this is an unlikely source of bleeding.  - transabdominal ultrasound to check for any residual uterine tissue that could be source of bleeding.   Pressure ulcer:  On admission it was noted that the patient has a new pressure ulcer on her right buttocks. WOC was consulted in ED and determined it to be a stage III ulcer. A silicone foam dressing was in place and was augmented with xeroform dressing. We appreciate their recommendations.  - Nursing staff to change xeroform BID  and position patient off the lesion - heels will be floated while in bed or chair - oxycodone 5-325 mg for pain Q6H PRN   Dysuria:  The patient complained of dysuria. She has been afebrile but does have an increased white count on CBC today.  - urinalysis given concern for possible UTI   Insomnia:  The patient reports difficulty sleeping while in the hospital.  - ramelteon 8 mg at bedtime  Diet: regular diet VTE ppx: SCDs; no lovenox due GI bleed Code: full  Dispo: pending further medical workup     LOS: 0 days   Mikael Spray, Medical Student 04/16/2020, 6:49 AM

## 2020-04-16 NOTE — Progress Notes (Signed)
The chaplain responded to a consult for prayer. The patient expressed concern over her health and asked that we pray for healing. The patient also expressed concern for her son that lives with her and takes care of her. The chaplain prayed with the patient and educated the patient on the availability of the chaplain.  Brion Aliment Chaplain Resident For questions concerning this note please contact me by pager [phone]

## 2020-04-16 NOTE — Plan of Care (Signed)

## 2020-04-16 NOTE — Progress Notes (Signed)
PT Cancellation Note  Patient Details Name: [name] MRN: [id number] DOB: 11-23-1936   Cancelled Treatment:    Reason Eval/Treat Not Completed: Other (comment).  States she is not interested in PT as she needs to focus on her reason for being in hosp, headed to test now.  Will follow up at another time to see if she is more willing to attempt PT.   Ramond Dial 04/16/2020, 3:01 PM   Mee Hives, PT MS Acute Rehab Dept. Number: [place] and [place]

## 2020-04-17 DIAGNOSIS — N3090 Cystitis, unspecified without hematuria: Secondary | ICD-10-CM

## 2020-04-17 DIAGNOSIS — G8929 Other chronic pain: Secondary | ICD-10-CM

## 2020-04-17 DIAGNOSIS — M810 Age-related osteoporosis without current pathological fracture: Secondary | ICD-10-CM

## 2020-04-17 LAB — BASIC METABOLIC PANEL
Anion gap: 10 (ref 5–15)
BUN: 25 mg/dL — ABNORMAL HIGH (ref 8–23)
CO2: 29 mmol/L (ref 22–32)
Calcium: 8.2 mg/dL — ABNORMAL LOW (ref 8.9–10.3)
Chloride: 100 mmol/L (ref 98–111)
Creatinine, Ser: 0.69 mg/dL (ref 0.44–1.00)
GFR calc Af Amer: >60 mL/min (ref >60)
GFR calc non Af Amer: >60 mL/min (ref >60)
Glucose, Bld: 115 mg/dL — ABNORMAL HIGH (ref 70–99)
Potassium: 4.1 mmol/L (ref 3.5–5.1)
Sodium: 139 mmol/L (ref 135–145)

## 2020-04-17 LAB — CBC
HCT: 26.1 % — ABNORMAL LOW (ref 36.0–46.0)
Hemoglobin: 7.4 g/dL — ABNORMAL LOW (ref 12.0–15.0)
MCH: 23.9 pg — ABNORMAL LOW (ref 26.0–34.0)
MCHC: 28.4 g/dL — ABNORMAL LOW (ref 30.0–36.0)
MCV: 84.5 fL (ref 80.0–100.0)
Platelets: 579 10*3/uL — ABNORMAL HIGH (ref 150–400)
RBC: 3.09 MIL/uL — ABNORMAL LOW (ref 3.87–5.11)
RDW: 23.9 % — ABNORMAL HIGH (ref 11.5–15.5)
WBC: 9.4 10*3/uL (ref 4.0–10.5)
nRBC: 0 % (ref 0.0–0.2)

## 2020-04-17 MED ORDER — HYDROCORTISONE ACETATE 25 MG RE SUPP
25.0000 mg | Freq: Two times a day (BID) | RECTAL | Status: DC
  Administered 2020-04-17 – 2020-04-18 (×3): 25 mg via RECTAL
  Filled 2020-04-17 (×4): qty 1

## 2020-04-17 MED ORDER — SODIUM CHLORIDE 0.9 % IV SOLN
1.0000 g | INTRAVENOUS | Status: DC
  Administered 2020-04-17: 1 g via INTRAVENOUS
  Filled 2020-04-17: qty 1
  Filled 2020-04-17: qty 10

## 2020-04-17 MED ORDER — MUSCLE RUB 10-15 % EX CREA
TOPICAL_CREAM | CUTANEOUS | Status: DC | PRN
  Filled 2020-04-17: qty 85

## 2020-04-17 MED ORDER — SULFAMETHOXAZOLE-TRIMETHOPRIM 800-160 MG PO TABS
1.0000 | ORAL_TABLET | Freq: Two times a day (BID) | ORAL | Status: DC
  Administered 2020-04-18: 1 via ORAL
  Filled 2020-04-17: qty 1

## 2020-04-17 MED ORDER — DICLOFENAC SODIUM 1 % EX GEL
2.0000 g | Freq: Four times a day (QID) | CUTANEOUS | Status: DC | PRN
  Administered 2020-04-17 (×2): 2 g via TOPICAL
  Filled 2020-04-17 (×2): qty 100

## 2020-04-17 MED ORDER — LIDOCAINE 5 % EX PTCH
1.0000 | MEDICATED_PATCH | CUTANEOUS | Status: DC
  Administered 2020-04-17: 1 via TRANSDERMAL
  Filled 2020-04-17: qty 1

## 2020-04-17 MED ORDER — OXYCODONE HCL 5 MG PO TABS
5.0000 mg | ORAL_TABLET | Freq: Once | ORAL | Status: AC
  Administered 2020-04-17: 5 mg via ORAL
  Filled 2020-04-17: qty 1

## 2020-04-17 NOTE — Progress Notes (Signed)
OT Cancellation Note  Patient Details Name: [name] MRN: [id number] DOB: 03-01-1936   Cancelled Treatment:    Reason Eval/Treat Not Completed: (Pt is declining therapy while in the hospital. Signing off.)  Malka So 04/17/2020, 9:14 AM  Nestor Lewandowsky, OTR/L Acute Rehabilitation Services Pager: [phone] Office: [phone]

## 2020-04-17 NOTE — Progress Notes (Signed)
PT Cancellation Note  Patient Details Name: [name] MRN: [id number] DOB: December 11, 1936   Cancelled Treatment:    Reason Eval/Treat Not Completed: Other (comment) - Pt adamantly refuses PT services in acute setting, states "I am trying to rest and get better, I am sick". PT explained benefits of mobility to prevent secondary issues, as well as pressure relief for sacral sore. Even so, pt continued to refuse and requests PT not check back. PT to sign off, please reconsult as needed.  [place] Pager [phone]  Office [phone]    Roxine Caddy D Elonda Husky 04/17/2020, 11:53 AM

## 2020-04-17 NOTE — Progress Notes (Signed)
Pt c/o pain in her legs, refused gel. No PRN analgesics available at this time. Reached out immediately to the physician on call, awaiting response. Will continue to monitor.

## 2020-04-17 NOTE — Progress Notes (Signed)
Patient ID: [name], female   DOB: 26-Nov-1936, 84 y.o.   MRN: [id number]   Subjective: HD: 2  [name] was seen this morning on rounds and reports having pain her hands and legs last night, which made it difficult to sleep. She asked about a gel pain medication she uses at home and we will reach out to her family to determine what this medication is. She also mentioned feeling like her face was hot last night around the time of this pain, but the nurse at the time said she was not flushed ant that her temperature was normal. She has not noticed any further bleeding from her rectum. Overall she reports feeling okay but is anxious to know the source of her ingoing GI bleeding. We discussed that we believe the source is from her intestines and that she is currently doing well medically. She was responsive to this conversation and seemed less anxious after our discussion.    Objective:  Vital signs in last 24 hours: Vitals:   04/15/20 2332 04/16/20 0836 04/16/20 1728 04/16/20 2113  BP: (!) 156/65 126/69 124/61 (!) 118/53  Pulse: 81 91 84 84  Resp:  17 16 20   Temp: 97.7 F (36.5 C) 97.8 F (36.6 C)  98.2 F (36.8 C)  TempSrc: Oral Oral    SpO2: 96% 97% 99% 100%   Weight change:  No intake or output data in the 24 hours ending 04/17/20 0640   Physical Exam  Cardiovascular: Normal rate, regular rhythm, normal heart sounds and intact distal pulses.  Respiratory: Effort normal and breath sounds normal.  GI: Soft. Bowel sounds are normal. She exhibits no distension. There is no abdominal tenderness.  Neurological: She is alert.  Skin: Skin is warm and dry. She is not diaphoretic.  Psychiatric: She has a normal mood and affect.   CBC Latest Ref Rng & Units 04/17/2020 04/16/2020 04/15/2020  WBC 4.0 - 10.5 K/uL 9.4 12.5(H) 10.5  Hemoglobin 12.0 - 15.0 g/dL 7.4(L) 7.7(L) 7.6(L)  Hematocrit 36.0 - 46.0 % 26.1(L) 27.1(L) 26.3(L)  Platelets 150 - 400 K/uL 579(H) 617(H) 648(H)   BMP  Latest Ref Rng & Units 04/17/2020 04/16/2020 04/15/2020  Glucose 70 - 99 mg/dL 115(H) 148(H) 124(H)  BUN 8 - 23 mg/dL 25(H) 28(H) 24(H)  Creatinine 0.44 - 1.00 mg/dL 0.69 0.88 0.68  Sodium 135 - 145 mmol/L 139 138 138  Potassium 3.5 - 5.1 mmol/L 4.1 4.6 4.0  Chloride 98 - 111 mmol/L 100 100 99  CO2 22 - 32 mmol/L 29 24 29   Calcium 8.9 - 10.3 mg/dL 8.2(L) 8.2(L) 8.3(L)   Urinalysis    Component Value Date/Time   COLORURINE YELLOW 04/16/2020 1930   APPEARANCEUR CLOUDY (A) 04/16/2020 1930   LABSPEC 1.018 04/16/2020 1930   PHURINE 7.0 04/16/2020 1930   GLUCOSEU NEGATIVE 04/16/2020 1930   HGBUR MODERATE (A) 04/16/2020 1930   BILIRUBINUR NEGATIVE 04/16/2020 1930   KETONESUR NEGATIVE 04/16/2020 1930   PROTEINUR 30 (A) 04/16/2020 1930   UROBILINOGEN 0.2 03/19/2014 1657   NITRITE POSITIVE (A) 04/16/2020 1930   LEUKOCYTESUR LARGE (A) 04/16/2020 1930   Abdominal ultrasound:  The uterus and ovaries were not visualized consistent with the remote history of a hysterectomy. A cyst in the left adnexa measures 2.5 x 1.8 x 2.0 cm, similar in size to a abdomen and pelvis CT from 06/12/2013.  Assessment/Plan:  Principal Problem:   GIB (gastrointestinal bleeding) Active Problems:   Hematochezia   COPD (chronic obstructive pulmonary disease) (HCC)  Chronic respiratory failure (HCC)   Acute blood loss anemia  Summary:  [name] is a 84 year old female with a history of CVA, COPD, esophagitis, gastritis, and internal hemorrhoids admitted for symptomatic anemia and GI bleeding on hospital day 2.   GI bleeding:  Resumed aspirin yesterday. HGB dropped to 7.4 from 7.7 yesterday. She reports having no further bleeding that she has noticed. Yesterday the patient expressed concern that bleeding was vaginal in origin. Chart review showed that she had a complete hysterectomy and bilateral salpingo-oophorectomy in the 1970s, however patient believed it was a partial hysterectomy. [place] was obtained to  confirm and rule out uterine source of bleeding. US showed absence of uterus and ovaries with stable benign left adnexal cyst ruling out uterine source of bleeding. Her bleeding is likely due to her diverticula and/or internal hemorrhoids. - continue protonix - hemorrhoidal suppository with HC BID - daily CBC - resumed aspirin yesterday; monitor for bleeding  UTI:  Yesterday the patient complained of burning with urination. Urinalysis was performed which was positive for Hgb, protein, nitrites, leukocytes, and rare bacteria.  - started ceftriaxone; will narrow after culture and transition to oral tomorrow  - urine culture  Chronic pain:  The patient has a history of chronic pain, osteoporosis with compression fractures, and arthritis. She complained of increased pain since yesterday especially in hands and legs. Contacted son to confirm medicated gel she uses at home which she feels relieves this pain the best. He confirmed that she uses diclofenac sodium 1% topical gel at home.  - diclofenac sodium 1% topical gel PRN  - lidocaine 5% patch  - crea muscle rub   Pressure ulcer:  On admission it was noted that the patient has a new pressure ulcer on her right buttocks. WOC was consulted in ED and determined it to be a stage III ulcer. A silicone foam dressing was in place and was augmented with xeroform dressing. We appreciate their recommendations.  - Nursing staff to change xeroform BID and position patient off the lesion - heels will be floated while in bed or chair - oxycodone 5-325 mg for pain Q6H PRN   Insomnia:  The patient reports difficulty sleeping while in the hospital.  - ramelteon 8 mg at bedtime  Diet:regular diet VTE ppx: SCDs; no lovenox due GI bleed Code:full  Dispo: discharge in 1-2 days    LOS: 1 day   Mikael Spray, Medical Student 04/17/2020, 6:40 AM

## 2020-04-18 LAB — CBC
HCT: 27.8 % — ABNORMAL LOW (ref 36.0–46.0)
Hemoglobin: 8.1 g/dL — ABNORMAL LOW (ref 12.0–15.0)
MCH: 23.8 pg — ABNORMAL LOW (ref 26.0–34.0)
MCHC: 29.1 g/dL — ABNORMAL LOW (ref 30.0–36.0)
MCV: 81.5 fL (ref 80.0–100.0)
Platelets: 531 10*3/uL — ABNORMAL HIGH (ref 150–400)
RBC: 3.41 MIL/uL — ABNORMAL LOW (ref 3.87–5.11)
RDW: 23 % — ABNORMAL HIGH (ref 11.5–15.5)
WBC: 8.4 10*3/uL (ref 4.0–10.5)
nRBC: 0 % (ref 0.0–0.2)

## 2020-04-18 LAB — BASIC METABOLIC PANEL
Anion gap: 8 (ref 5–15)
BUN: 20 mg/dL (ref 8–23)
CO2: 32 mmol/L (ref 22–32)
Calcium: 8.7 mg/dL — ABNORMAL LOW (ref 8.9–10.3)
Chloride: 97 mmol/L — ABNORMAL LOW (ref 98–111)
Creatinine, Ser: 0.72 mg/dL (ref 0.44–1.00)
GFR calc Af Amer: >60 mL/min (ref >60)
GFR calc non Af Amer: >60 mL/min (ref >60)
Glucose, Bld: 106 mg/dL — ABNORMAL HIGH (ref 70–99)
Potassium: 4.3 mmol/L (ref 3.5–5.1)
Sodium: 137 mmol/L (ref 135–145)

## 2020-04-18 MED ORDER — CEPHALEXIN 500 MG PO CAPS: 500.0000 mg | ORAL_CAPSULE | Freq: Two times a day (BID) | ORAL | 0 refills | Status: AC

## 2020-04-18 MED ORDER — ASPIRIN EC 81 MG PO TBEC: 81.0000 mg | DELAYED_RELEASE_TABLET | Freq: Every day | ORAL | 2 refills | Status: AC

## 2020-04-18 MED ORDER — HYDROCORTISONE ACETATE 25 MG RE SUPP: 25.0000 mg | Freq: Two times a day (BID) | RECTAL | 0 refills | Status: DC

## 2020-04-18 NOTE — TOC Transition Note (Signed)
Transition of Care [facility]) - CM/SW Discharge Note   Patient Details  Name: [name] MRN: [id number] Date of Birth: [birth year]  [facility] CM/SW Contact:  Atilano Median, LCSW Phone Number: 04/18/2020, 11:53 AM   Clinical Narrative:     Discharged home. Transport confirmed with patient's son [name]. No other needs at this time. Case closed to this CSW.   Final next level of care: Home/Self Care Barriers to Discharge: Barriers Resolved   Patient Goals and CMS Choice        Discharge Placement                Patient to be transferred to facility by: [facility] Name of family member notified: [name] Patient and family notified of of transfer: 04/18/20  Discharge Plan and Services                                     Social Determinants of Health (SDOH) Interventions     Readmission Risk Interventions Readmission Risk Prevention Plan 06/20/2019 05/01/2019 08/22/2018  Post Dischage Appt - - Patient refused  Medication Screening - - Complete  Transportation Screening Complete Complete Complete  PCP follow-up - - Patient refused  PCP or Specialist Appt within 5-7 Days Complete - -  Home Care Screening Complete - -  Medication Review (RN CM) Complete - -  Medication Review (RN Transport planner) - Referral to Pharmacy -  Some recent data might be hidden

## 2020-04-18 NOTE — Progress Notes (Signed)
Subjective:   Pt was seen at the bedside today.  We discussed how the patient's blood counts are stable today and does not show any signs of bleeding.  Patient was happy to hear this.  She feels like she is ready to go home.  We discussed the need for her to follow-up with her PCP and GI doctors in the outpatient setting to make sure her blood counts and urine infection clears up.  Objective: CBC Latest Ref Rng & Units 04/17/2020 04/16/2020 04/15/2020  WBC 4.0 - 10.5 K/uL 9.4 12.5(H) 10.5  Hemoglobin 12.0 - 15.0 g/dL 7.4(L) 7.7(L) 7.6(L)  Hematocrit 36.0 - 46.0 % 26.1(L) 27.1(L) 26.3(L)  Platelets 150 - 400 K/uL 579(H) 617(H) 648(H)   BMP Latest Ref Rng & Units 04/17/2020 04/16/2020 04/15/2020  Glucose 70 - 99 mg/dL 115(H) 148(H) 124(H)  BUN 8 - 23 mg/dL 25(H) 28(H) 24(H)  Creatinine 0.44 - 1.00 mg/dL 0.69 0.88 0.68  Sodium 135 - 145 mmol/L 139 138 138  Potassium 3.5 - 5.1 mmol/L 4.1 4.6 4.0  Chloride 98 - 111 mmol/L 100 100 99  CO2 22 - 32 mmol/L 29 24 29   Calcium 8.9 - 10.3 mg/dL 8.2(L) 8.2(L) 8.3(L)   Vital signs in last 24 hours: Vitals:   04/16/20 0836 04/16/20 1728 04/16/20 2113 04/17/20 2234  BP: 126/69 124/61 (!) 118/53 (!) 122/57  Pulse: 91 84 84 74  Resp: 17 16 20    Temp: 97.8 F (36.6 C)  98.2 F (36.8 C) 98 F (36.7 C)  TempSrc: Oral   Oral  SpO2: 97% 99% 100% 99%    Physical Exam General: Comfortably resting in bed, NAD HEENT: NCAT CV: Normal S1-S2, no murmurs rubs or gallops appreciated PULM: Clear to auscultation bilaterally, no crackles  ABD: Soft and nontender in all quadrants NEURO: Alert and oriented, nonfocal  Assessment/Plan:  Principal Problem:   GIB (gastrointestinal bleeding) Active Problems:   Hematochezia   COPD (chronic obstructive pulmonary disease) (HCC)   Chronic respiratory failure (HCC)   Acute blood loss anemia  In summary, [name] is a 84 year old female with a history of CVA, COPD, esophagitis, gastritis, and internal hemorrhoids  admitted for symptomatic anemia and rectal bleeding. Her course has been complicated by dydura and UA findings consistent with UTI.   #Rectal bleeding:  Resumed aspirin yesterday. HGB dropped to 7.4 from 7.7 yesterday. She reports having no further bleeding that she has noticed. Yesterday the patient expressed concern that bleeding was vaginal in origin. Chart review showed that she had a complete hysterectomy and bilateral salpingo-oophorectomy in the 1970s, however patient believed it was a partial hysterectomy. [place] was obtained to confirm and rule out uterine source of bleeding. US showed absence of uterus and ovaries with stable benign left adnexal cyst ruling out uterine source of bleeding. Her bleeding is likely due to her diverticula and/or internal hemorrhoids. - continue protonix - hemorrhoidal suppository with HC BID - daily CBC - resumed aspirin yesterday; monitor for bleeding  #Uncomplicated Cystitis: Yesterday the patient complained of burning with urination. Urinalysis was performed which was positive for Hgb, protein, nitrites, leukocytes, and rare bacteria.  S/p 1 dose of ceftriaxone yesterday. -Unfortunately, the urine cultures not obtained prior to antibiotic treatment. -Plan to treat with Keflex 500 mg BID to complete 5 day course (stop date 4/25)  #Chronic pain:  The patient has a history of chronic pain, osteoporosis with compression fractures, and arthritis. She complained of increased pain since yesterday especially in hands and  legs. Contacted son to confirm medicated gel she uses at home which she feels relieves this pain the best. He confirmed that she uses diclofenac sodium 1% topical gel at home.  - diclofenac sodium 1% topical gel PRN  - lidocaine 5% patch  - crea muscle rub   #Pressure ulcer:  On admission it was noted that the patient has a new pressure ulcer on her right buttocks. WOC was consulted in ED and determined it to be a stage III ulcer. A silicone foam  dressing was in place and was augmented with xeroform dressing. We appreciate their recommendations.  - Nursing staff to change xeroform BID and position patient off the lesion - heels will be floated while in bed or chair -oxycodone 5-325 mgfor pain Q6H PRN  #Insomnia:  The patient reports difficulty sleeping while in the hospital.  -Ramelteon 8 mg at bedtime  #FEN/GI -Diet: Regular -Fluids: None  #DVT prophylaxis -SCDs  #CODE STATUS: FULL  #Dispo: Anticipated discharge today  Prior to Admission Living Arrangement: Home Anticipated Discharge Location: Home Barriers to Discharge: None  Earlene Plater, MD Internal Medicine, PGY1 Pager: [phone]  04/18/2020,11:46 AM

## 2020-04-18 NOTE — Progress Notes (Signed)
RN provided emotional support and active listening to pt. She reported frustration to have to press call bell if needed something, and that she felt it had taken health care staff too long to respond. RN listened and reviewed with pt when RN and NT had completed personal care on pt, administered medications, rounded on pt, and meal prepped for pt.   Pt did not remember most visits completed by staff and/or time frame in which things occurred. RN reoriented pt to current time and discharge orders. Pt aware she is being discharged today. Pt verbalized understanding.   RN will continue to monitor pt.

## 2020-04-20 LAB — URINE CULTURE: Culture: 80000 — AB

## 2020-04-20 NOTE — Discharge Summary (Addendum)
Name: [name] MRN: [id number] DOB: [birth year] 84 y.o. PCP: Adriana Downing, MD  Date of Admission: 04/14/2020 10:39 PM Date of Discharge: 04/18/2020 Attending Physician: Adriana Pressman, MD  Discharge Diagnosis: 1.  Rectal bleeding 2.  Uncomplicated cystitis  Discharge Medications: Allergies as of 04/18/2020       Reactions   Ambien [zolpidem] Other (See Comments)   Hallucinations and paranoid   Codeine Hives, Nausea And Vomiting        Medication List     STOP taking these medications    acetaminophen-codeine 300-30 MG tablet Commonly known as: TYLENOL #3       TAKE these medications    amLODipine 5 MG tablet Commonly known as: NORVASC Take 1 tablet (5 mg total) by mouth daily.   aspirin EC 81 MG tablet Take 1 tablet (81 mg total) by mouth daily.   atorvastatin 20 MG tablet Commonly known as: LIPITOR Take 1 tablet (20 mg total) by mouth daily at 6 PM.   cephALEXin 500 MG capsule Commonly known as: KEFLEX Take 1 capsule (500 mg total) by mouth 2 (two) times daily for 5 days.   cyanocobalamin 1000 MCG tablet Take 1 tablet (1,000 mcg total) by mouth daily.   gabapentin 100 MG capsule Commonly known as: NEURONTIN Take 1 capsule (100 mg total) by mouth at bedtime.   hydrocortisone 25 MG suppository Commonly known as: ANUSOL-HC Place 1 suppository (25 mg total) rectally 2 (two) times daily.   Muscle Rub 10-15 % Crea Apply 1 application topically 2 (two) times daily as needed for muscle pain.   pantoprazole 40 MG tablet Commonly known as: PROTONIX TAKE 1 TABLET BY MOUTH DAILY   polyethylene glycol 17 g packet Commonly known as: MIRALAX / GLYCOLAX Take 17 g by mouth daily.   psyllium 95 % Pack Commonly known as: HYDROCIL/METAMUCIL Take 1 packet by mouth 2 (two) times daily.   senna-docusate 8.6-50 MG tablet Commonly known as: Senokot-S Take 2 tablets by mouth 2 (two) times daily. What changed:  when to take this reasons to take  this       Disposition and follow-up:   [name] was discharged from [facility] in [place] condition. At the hospital follow up visit please address:  1.  Rectal bleeding: Patient presented with Hgb of 7.0 and symptomatic anemia. Patient endorsed bright red blood per rectum.  Positive FOBT.  We stopped her home aspirin transfuse her 1 unit of blood.  GI was consulted, and they recommended hemorrhoid suppositories twice a day. -F/u  with PCP -F/u with GI in the outpatient setting  2.  Uncomplicated cystitis: Pt developed UTI sympomts a day before discharge. Proteus mirabilis on urine cultures after UA positive for leukocytes and nitrites. Pt was discharged home with Keflex to complete a 5 day course. -F/u to make sure urinary symptoms improve -Keflex 500 mg BID (stop date 4/25)   3.  Labs / imaging needed at time of follow-up: CBC, urinalysis   4.  Pending labs/ test needing follow-up: Urine cultures  Follow-up Appointments:    Hospital Course by problem list: 1.  Rectal bleeding 2.  Uncomplicated cystitis  [name] is a 84 year old female with a history of CVA, COPD, esophagitis, gastritis, and internal hemorrhoids admitted for symptomatic anemia and rectal bleeding.  Initially, her hemoglobin was at 7.0 in the ED.  She received a unit of PRBCs and had an appropriate response.  We discontinued her home aspirin and  monitor her blood counts.  The day before discharge her hemoglobin was stable at 8.1 after restarting her home aspirin the day prior.  GI was consulted during this hospitalization, and they felt like they could follow up with her in the outpatient setting.  I recommended hemorrhoid suppositories twice a day in the meantime.  Of note, her course has been complicated by dysuria and UA findings consistent with UTI.  Patient was discharged with a 5-day course of Keflex.  After discharge, her urine cultures grew Proteus mirabilis sensitive to  cefazolin/cephalexin.  Encouraged patient to follow-up with her PCP to make sure her urine symptoms have resolved.  Discharge Vitals:   BP (!) 136/56 (BP Location: Right Arm)   Pulse 76   Temp 97.7 F (36.5 C)   Resp 20   SpO2 99%   Pertinent Labs, Studies, and Procedures:   CBC Latest Ref Rng & Units 04/18/2020 04/17/2020 04/16/2020  WBC 4.0 - 10.5 K/uL 8.4 9.4 12.5(H)  Hemoglobin 12.0 - 15.0 g/dL 8.1(L) 7.4(L) 7.7(L)  Hematocrit 36.0 - 46.0 % 27.8(L) 26.1(L) 27.1(L)  Platelets 150 - 400 K/uL 531(H) 579(H) 617(H)   BMP Latest Ref Rng & Units 04/18/2020 04/17/2020 04/16/2020  Glucose 70 - 99 mg/dL 106(H) 115(H) 148(H)  BUN 8 - 23 mg/dL 20 25(H) 28(H)  Creatinine 0.44 - 1.00 mg/dL 0.72 0.69 0.88  Sodium 135 - 145 mmol/L 137 139 138  Potassium 3.5 - 5.1 mmol/L 4.3 4.1 4.6  Chloride 98 - 111 mmol/L 97(L) 100 100  CO2 22 - 32 mmol/L 32 29 24  Calcium 8.9 - 10.3 mg/dL 8.7(L) 8.2(L) 8.2(L)   Urine cultures  Specimen Description URINE, RANDOM   Special Requests NONE  Performed at [facility] [address], [place], [place] [address]   Culture 80,000 COLONIES/mL PROTEUS MIRABILISAbnormal    Report Status 04/20/2020 FINAL   Organism ID, Bacteria PROTEUS MIRABILISAbnormal     Discharge Instructions: Discharge Instructions     Call MD for:  difficulty breathing, headache or visual disturbances   Complete by: As directed    Call MD for:  extreme fatigue   Complete by: As directed    Call MD for:  hives   Complete by: As directed    Call MD for:  persistant dizziness or light-headedness   Complete by: As directed    Call MD for:  persistant nausea and vomiting   Complete by: As directed    Call MD for:  redness, tenderness, or signs of infection (pain, swelling, redness, odor or green/yellow discharge around incision site)   Complete by: As directed    Call MD for:  severe uncontrolled pain   Complete by: As directed    Call MD for:  temperature >100.4   Complete by: As  directed    Diet - low sodium heart healthy   Complete by: As directed    Discharge instructions   Complete by: As directed    Thank you for allowing [place] to take care of you during your hospitalization.  Below is a summary of what we treated:  1.  Rectal bleeding -You had bleeding come out of your bottom that we think was coming from either your hemorrhoids or diverticulosis.  We discussed it with the GI doctors, and they would like to follow-up with you in outpatient setting. -Your blood counts were stable before we discharged from the hospital.  Please start taking aspirin 81 mg daily.  2.  Urinary tract infection -We gave  you antibiotics to take at home.  Please take Keflex 500 mg twice a day.  Your last dose of this medication will be on 4/25.  3.  Follow-up -Please schedule a follow-up appointment with your primary doctor soon as possible.  They can help recheck your urine to make sure infection clears. -Please schedule a follow-up appointment with the GI doctors as soon as possible.  They can help you manage your rectal bleeding moving forward.   Increase activity slowly   Complete by: As directed       Signed: Earlene Plater, MD Internal Medicine, PGY1 Pager: [phone]  04/20/2020,12:30 PM

## 2020-05-02 ENCOUNTER — Observation Stay (HOSPITAL_COMMUNITY): Payer: Medicare Other

## 2020-05-02 ENCOUNTER — Observation Stay (HOSPITAL_COMMUNITY)
Admission: EM | Admit: 2020-05-02 | Discharge: 2020-05-04 | Disposition: A | Discharge status: Home Health | Payer: Medicare Other | Attending: Internal Medicine | Admitting: Internal Medicine

## 2020-05-02 ENCOUNTER — Encounter (HOSPITAL_COMMUNITY): Payer: Self-pay

## 2020-05-02 ENCOUNTER — Other Ambulatory Visit: Payer: Self-pay

## 2020-05-02 DIAGNOSIS — D473 Essential (hemorrhagic) thrombocythemia: Secondary | ICD-10-CM | POA: Insufficient documentation

## 2020-05-02 DIAGNOSIS — M1990 Unspecified osteoarthritis, unspecified site: Secondary | ICD-10-CM | POA: Diagnosis not present

## 2020-05-02 DIAGNOSIS — Z79899 Other long term (current) drug therapy: Secondary | ICD-10-CM | POA: Diagnosis not present

## 2020-05-02 DIAGNOSIS — J449 Chronic obstructive pulmonary disease, unspecified: Secondary | ICD-10-CM | POA: Diagnosis not present

## 2020-05-02 DIAGNOSIS — Z885 Allergy status to narcotic agent status: Secondary | ICD-10-CM | POA: Insufficient documentation

## 2020-05-02 DIAGNOSIS — Z888 Allergy status to other drugs, medicaments and biological substances status: Secondary | ICD-10-CM | POA: Insufficient documentation

## 2020-05-02 DIAGNOSIS — M810 Age-related osteoporosis without current pathological fracture: Secondary | ICD-10-CM | POA: Diagnosis not present

## 2020-05-02 DIAGNOSIS — D649 Anemia, unspecified: Secondary | ICD-10-CM | POA: Diagnosis not present

## 2020-05-02 DIAGNOSIS — Z9981 Dependence on supplemental oxygen: Secondary | ICD-10-CM | POA: Insufficient documentation

## 2020-05-02 DIAGNOSIS — K922 Gastrointestinal hemorrhage, unspecified: Secondary | ICD-10-CM

## 2020-05-02 DIAGNOSIS — I6930 Unspecified sequelae of cerebral infarction: Secondary | ICD-10-CM | POA: Insufficient documentation

## 2020-05-02 DIAGNOSIS — Z20822 Contact with and (suspected) exposure to covid-19: Secondary | ICD-10-CM | POA: Insufficient documentation

## 2020-05-02 DIAGNOSIS — R0989 Other specified symptoms and signs involving the circulatory and respiratory systems: Secondary | ICD-10-CM

## 2020-05-02 DIAGNOSIS — N939 Abnormal uterine and vaginal bleeding, unspecified: Secondary | ICD-10-CM | POA: Insufficient documentation

## 2020-05-02 DIAGNOSIS — K921 Melena: Principal | ICD-10-CM | POA: Insufficient documentation

## 2020-05-02 DIAGNOSIS — N390 Urinary tract infection, site not specified: Secondary | ICD-10-CM | POA: Insufficient documentation

## 2020-05-02 DIAGNOSIS — I10 Essential (primary) hypertension: Secondary | ICD-10-CM | POA: Diagnosis not present

## 2020-05-02 DIAGNOSIS — K625 Hemorrhage of anus and rectum: Secondary | ICD-10-CM | POA: Diagnosis present

## 2020-05-02 DIAGNOSIS — Z7982 Long term (current) use of aspirin: Secondary | ICD-10-CM | POA: Diagnosis not present

## 2020-05-02 DIAGNOSIS — G8929 Other chronic pain: Secondary | ICD-10-CM | POA: Insufficient documentation

## 2020-05-02 DIAGNOSIS — Z87891 Personal history of nicotine dependence: Secondary | ICD-10-CM | POA: Insufficient documentation

## 2020-05-02 LAB — FERRITIN: Ferritin: 12 ng/mL (ref 11–307)

## 2020-05-02 LAB — COMPREHENSIVE METABOLIC PANEL
ALT: 9 U/L (ref 0–44)
AST: 18 U/L (ref 15–41)
Albumin: 2.9 g/dL — ABNORMAL LOW (ref 3.5–5.0)
Alkaline Phosphatase: 105 U/L (ref 38–126)
Anion gap: 11 (ref 5–15)
BUN: 21 mg/dL (ref 8–23)
CO2: 30 mmol/L (ref 22–32)
Calcium: 8.3 mg/dL — ABNORMAL LOW (ref 8.9–10.3)
Chloride: 97 mmol/L — ABNORMAL LOW (ref 98–111)
Creatinine, Ser: 0.69 mg/dL (ref 0.44–1.00)
GFR calc Af Amer: >60 mL/min (ref >60)
GFR calc non Af Amer: >60 mL/min (ref >60)
Glucose, Bld: 146 mg/dL — ABNORMAL HIGH (ref 70–99)
Potassium: 4.6 mmol/L (ref 3.5–5.1)
Sodium: 138 mmol/L (ref 135–145)
Total Bilirubin: 0.3 mg/dL (ref 0.3–1.2)
Total Protein: 7 g/dL (ref 6.5–8.1)

## 2020-05-02 LAB — CBC
HCT: 23.9 % — ABNORMAL LOW (ref 36.0–46.0)
Hemoglobin: 6.7 g/dL — CL (ref 12.0–15.0)
MCH: 23.8 pg — ABNORMAL LOW (ref 26.0–34.0)
MCHC: 28 g/dL — ABNORMAL LOW (ref 30.0–36.0)
MCV: 85.1 fL (ref 80.0–100.0)
Platelets: 963 10*3/uL (ref 150–400)
RBC: 2.81 MIL/uL — ABNORMAL LOW (ref 3.87–5.11)
RDW: 21.2 % — ABNORMAL HIGH (ref 11.5–15.5)
WBC: 9.2 10*3/uL (ref 4.0–10.5)
nRBC: 0 % (ref 0.0–0.2)

## 2020-05-02 LAB — HEMOGLOBIN AND HEMATOCRIT, BLOOD
HCT: 25.8 % — ABNORMAL LOW (ref 36.0–46.0)
Hemoglobin: 7.6 g/dL — ABNORMAL LOW (ref 12.0–15.0)

## 2020-05-02 LAB — RESPIRATORY PANEL BY RT PCR (FLU A&B, COVID)
Influenza A by PCR: NEGATIVE
Influenza B by PCR: NEGATIVE
SARS Coronavirus 2 by RT PCR: NEGATIVE

## 2020-05-02 LAB — IRON AND TIBC
Iron: 24 ug/dL — ABNORMAL LOW (ref 28–170)
Saturation Ratios: 7 % — ABNORMAL LOW (ref 10.4–31.8)
TIBC: 339 ug/dL (ref 250–450)
UIBC: 315 ug/dL

## 2020-05-02 LAB — POC OCCULT BLOOD, ED: Fecal Occult Bld: POSITIVE — AB

## 2020-05-02 LAB — PROTIME-INR
INR: 1 (ref 0.8–1.2)
Prothrombin Time: 12.4 seconds (ref 11.4–15.2)

## 2020-05-02 LAB — RETICULOCYTES
Immature Retic Fract: 32.5 % — ABNORMAL HIGH (ref 2.3–15.9)
RBC.: 3.04 MIL/uL — ABNORMAL LOW (ref 3.87–5.11)
Retic Count, Absolute: 114.3 10*3/uL (ref 19.0–186.0)
Retic Ct Pct: 3.8 % — ABNORMAL HIGH (ref 0.4–3.1)

## 2020-05-02 LAB — PREPARE RBC (CROSSMATCH)

## 2020-05-02 MED ORDER — ACETAMINOPHEN 650 MG RE SUPP
650.0000 mg | Freq: Four times a day (QID) | RECTAL | Status: DC | PRN
  Filled 2020-05-02: qty 1

## 2020-05-02 MED ORDER — SODIUM CHLORIDE 0.9 % IV SOLN
10.0000 mL/h | Freq: Once | INTRAVENOUS | Status: AC
  Administered 2020-05-02: 10 mL/h via INTRAVENOUS

## 2020-05-02 MED ORDER — SODIUM CHLORIDE 0.9 % IV BOLUS
500.0000 mL | Freq: Once | INTRAVENOUS | Status: AC
  Administered 2020-05-02: 500 mL via INTRAVENOUS

## 2020-05-02 MED ORDER — FENTANYL CITRATE (PF) 100 MCG/2ML IJ SOLN
25.0000 ug | Freq: Once | INTRAMUSCULAR | Status: AC
  Administered 2020-05-02: 14:00:00 25 ug via INTRAVENOUS
  Filled 2020-05-02: qty 2

## 2020-05-02 MED ORDER — PANTOPRAZOLE SODIUM 40 MG IV SOLR
40.0000 mg | Freq: Two times a day (BID) | INTRAVENOUS | Status: DC
  Administered 2020-05-02 – 2020-05-03 (×2): 40 mg via INTRAVENOUS
  Filled 2020-05-02 (×2): qty 40

## 2020-05-02 MED ORDER — ACETAMINOPHEN 325 MG PO TABS
650.0000 mg | ORAL_TABLET | Freq: Four times a day (QID) | ORAL | Status: DC | PRN
  Administered 2020-05-02 – 2020-05-04 (×4): 650 mg via ORAL
  Filled 2020-05-02 (×4): qty 2

## 2020-05-02 NOTE — ED Provider Notes (Signed)
[facility] EMERGENCY DEPARTMENT Provider Note   CSN: [id number] Arrival date & time: 05/02/20  1135     History Chief Complaint  Patient presents with  . Rectal Bleeding    ENIA [name] is a 84 y.o. female.  Patient with history of CVA with left-sided deficits, COPD, esophagitis, gastritis, and internal hemorrhoids, recently admitted for symptomatic anemia and rectal bleeding, transfused 1 unit PRBC, h/o endoscopy 2020 with findings suggestive of pill esophagitis and erosive gastritis, colonoscopy 02/2020 with polyp, internal hemorrhoids, scattered diverticular change -- presents today for weakness and recurrent BRBPR. States that she saw a 'glob' of blood in her diaper today.  Symptoms have been progressive since discharge.  She lives at home with her son and states that she cannot really get out of bed due to being profoundly weak and afraid to fall.  She feels very dizzy with standing.  No chest pain or shortness of breath.  No abdominal pain.  No nausea, vomiting, diarrhea.        Past Medical History:  Diagnosis Date  . Arthritis   . CAP (community acquired pneumonia)    multiple admissions  . Chronic back pain 08/22/2018  . Colitis 03/19/2014  . Compression fx, lumbar spine ([place])   . COPD (chronic obstructive pulmonary disease) ([place])   . Delirium   . Hypertension   . On home oxygen therapy 06-14-13   continuos 2.5l/m nasally-24/7  . Osteoporosis   . Sacral fracture [facility]) 2014    Patient Active Problem List   Diagnosis Date Noted  . GIB (gastrointestinal bleeding) 04/15/2020  . Fecal impaction in rectum ([place])   . Thrombocytosis ([place]) 03/16/2020  . Pressure injury of skin 03/16/2020  . Left knee pain 06/18/2019  . Upper GI bleed 06/17/2019  . 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 Acute blood loss anemia   . Symptomatic anemia   . Supplemental oxygen dependent   . Right pontine stroke ([place]) 05/02/2019  . Stroke ([place]) 05/01/2019  . Acute renal failure  superimposed on stage 3 chronic kidney disease ([place]) 05/01/2019  . Acute ischemic stroke ([place]) 05/01/2019  . AKI (acute kidney injury) ([place]) 08/29/2018  . Encephalopathy acute 08/22/2018  . Chronic back pain 08/22/2018  . Vitamin B12 deficiency 08/19/2018  . Dehydration   . Tachycardia   . Altered mental state 08/18/2018  . Closed compression fracture of L1 vertebra (HCC)   . Acute cystitis without hematuria   . Pain management   . Intractable pain   . Back pain 10/10/2016  . L2 vertebral fracture ([place]) 10/10/2016  . Acute encephalopathy 12/25/2015  . Drug-induced delirium 12/25/2015  . Acute on chronic respiratory failure ([place]) 12/25/2015  . CAP (community acquired pneumonia) 12/19/2015  . Chronic respiratory failure ([place]) 03/19/2014  . Depression 03/19/2014  . Aspiration pneumonia ([place]) 03/10/2014  . Acute respiratory failure with hypoxia ([place]) 03/10/2014  . Sepsis ([place]) 03/10/2014  . Hypokalemia 03/10/2014  . Hypertension 03/10/2014  . COPD (chronic obstructive pulmonary disease) ([place]) 03/10/2014  . Tobacco abuse 03/10/2014  . Normocytic anemia 06/15/2013  . Generalized abdominal pain 06/15/2013  . Hematochezia 06/15/2013    Past Surgical History:  Procedure Laterality Date  . ABDOMINAL HYSTERECTOMY    . BIOPSY  05/24/2019   Procedure: BIOPSY;  Surgeon: Ronald Lobo, MD;  Location: [facility];  Service: Endoscopy;;  . COLONOSCOPY WITH PROPOFOL Left 03/20/2020   Procedure: COLONOSCOPY WITH PROPOFOL;  Surgeon: Wilford Corner, MD;  Location: [facility];  Service: Gastroenterology;  Laterality: Left;  needs ultraslim colonoscope  . ESOPHAGOGASTRODUODENOSCOPY N/A 06/15/2013   Procedure: ESOPHAGOGASTRODUODENOSCOPY (EGD);  Surgeon: [name], MD;  Location: [facility];  Service: Endoscopy;  Laterality: N/A;  . ESOPHAGOGASTRODUODENOSCOPY (EGD) WITH PROPOFOL N/A 05/24/2019   Procedure: ESOPHAGOGASTRODUODENOSCOPY (EGD) WITH PROPOFOL;  Surgeon: Ronald Lobo,  MD;  Location: [facility];  Service: Endoscopy;  Laterality: N/A;  . IR GENERIC HISTORICAL  10/15/2016   IR KYPHO LUMBAR INC FX REDUCE BONE BX UNI/BIL CANNULATION INC/IMAGING 10/15/2016 Luanne Bras, MD [facility]-INTERV RAD  . POLYPECTOMY  03/20/2020   Procedure: POLYPECTOMY;  Surgeon: Wilford Corner, MD;  Location: [facility] ENDOSCOPY;  Service: Gastroenterology;;  . sacroplasty  06-14-13   05-10-13 IVR [facility] for fracture stabilization     OB History   No obstetric history on file.     Family History  Problem Relation Age of Onset  . Hypertension Sister   . Diabetes Mellitus II Sister     Social History   Tobacco Use  . Smoking status: Former Smoker    Types: Cigarettes    Quit date: 06/15/2011    Years since quitting: 8.8  . Smokeless tobacco: Never Used  Substance Use Topics  . Alcohol use: No  . Drug use: No    Home Medications Prior to Admission medications   Medication Sig Start Date End Date Taking? Authorizing Provider  amLODipine (NORVASC) 5 MG tablet Take 1 tablet (5 mg total) by mouth daily. Patient not taking: Reported on 03/17/2020 05/26/19   Love, Ivan Anchors, PA-C  aspirin EC 81 MG tablet Take 1 tablet (81 mg total) by mouth daily. 04/18/20 04/18/21  Earlene Plater, MD  atorvastatin (LIPITOR) 20 MG tablet Take 1 tablet (20 mg total) by mouth daily at 6 PM. Patient not taking: Reported on 03/17/2020 05/26/19   Love, Ivan Anchors, PA-C  cyanocobalamin 1000 MCG tablet Take 1 tablet (1,000 mcg total) by mouth daily. Patient not taking: Reported on 03/17/2020 05/26/19   Love, Ivan Anchors, PA-C  gabapentin (NEURONTIN) 100 MG capsule Take 1 capsule (100 mg total) by mouth at bedtime. Patient not taking: Reported on 03/17/2020 05/26/19   Love, Ivan Anchors, PA-C  hydrocortisone (ANUSOL-HC) 25 MG suppository Place 1 suppository (25 mg total) rectally 2 (two) times daily. 04/18/20   Earlene Plater, MD  Menthol-Methyl Salicylate (MUSCLE RUB) 10-15 % CREA Apply 1 application topically 2 (two)  times daily as needed for muscle pain. Patient not taking: Reported on 04/15/2020 05/26/19   Love, Ivan Anchors, PA-C  pantoprazole (PROTONIX) 40 MG tablet TAKE 1 TABLET BY MOUTH DAILY Patient not taking: No sig reported 05/26/19   Love, Ivan Anchors, PA-C  polyethylene glycol (MIRALAX / GLYCOLAX) 17 g packet Take 17 g by mouth daily. 03/20/20   Jose Persia, MD  psyllium (HYDROCIL/METAMUCIL) 95 % PACK Take 1 packet by mouth 2 (two) times daily. Patient not taking: Reported on 03/17/2020 06/20/19   Georgette Shell, MD  senna-docusate (SENOKOT-S) 8.6-50 MG tablet Take 2 tablets by mouth 2 (two) times daily. Patient taking differently: Take 2 tablets by mouth 2 (two) times daily as needed for mild constipation.  05/26/19   Love, Ivan Anchors, PA-C    Allergies    Ambien [zolpidem] and Codeine  Review of Systems   Review of Systems  Constitutional: Negative for fever.  HENT: Negative for rhinorrhea and sore throat.   Eyes: Negative for redness.  Respiratory: Negative for cough and shortness of breath.   Cardiovascular: Negative for chest pain.  Gastrointestinal: Positive for blood in stool.  Negative for abdominal pain, diarrhea, nausea and vomiting.  Genitourinary: Negative for dysuria.  Musculoskeletal: Negative for myalgias.  Skin: Negative for rash.  Neurological: Positive for weakness and light-headedness. Negative for syncope and headaches.    Physical Exam Updated Vital Signs BP (!) 131/105 (BP Location: Right Arm)   Pulse (!) 111   Resp 18   SpO2 100%   Physical Exam Vitals and nursing note reviewed. Exam conducted with a chaperone present.  Constitutional:      Appearance: She is well-developed.  HENT:     Head: Normocephalic and atraumatic.     Mouth/Throat:     Mouth: Mucous membranes are dry.  Eyes:     General:        Right eye: No discharge.        Left eye: No discharge.     Conjunctiva/sclera: Conjunctivae normal.  Cardiovascular:     Rate and Rhythm: Normal rate and  regular rhythm.     Heart sounds: Normal heart sounds.  Pulmonary:     Effort: Pulmonary effort is normal.     Breath sounds: Normal breath sounds.  Abdominal:     Palpations: Abdomen is soft.     Tenderness: There is no abdominal tenderness. There is no guarding or rebound.  Genitourinary:    Comments: No obvious external vaginal bleeding. Non-inflamed external hemorrhoid on external exam. No significant tenderness with DRE.  Musculoskeletal:     Cervical back: Normal range of motion and neck supple.  Skin:    General: Skin is warm and dry.  Neurological:     Mental Status: She is alert.       ED Results / Procedures / Treatments   Labs (all labs ordered are listed, but only abnormal results are displayed) Labs Reviewed  COMPREHENSIVE METABOLIC PANEL - Abnormal; Notable for the following components:      Result Value   Chloride 97 (*)    Glucose, Bld 146 (*)    Calcium 8.3 (*)    Albumin 2.9 (*)    All other components within normal limits  CBC - Abnormal; Notable for the following components:   RBC 2.81 (*)    Hemoglobin 6.7 (*)    HCT 23.9 (*)    MCH 23.8 (*)    MCHC 28.0 (*)    RDW 21.2 (*)    Platelets 963 (*)    All other components within normal limits  POC OCCULT BLOOD, ED - Abnormal; Notable for the following components:   Fecal Occult Bld POSITIVE (*)    All other components within normal limits  RESPIRATORY PANEL BY RT PCR (FLU A&B, COVID)  PROTIME-INR  URINALYSIS, ROUTINE W REFLEX MICROSCOPIC  PATHOLOGIST SMEAR REVIEW  TYPE AND SCREEN  PREPARE RBC (CROSSMATCH)    EKG None  Radiology No results found.  Procedures Procedures (including critical care time)  Medications Ordered in ED Medications  0.9 %  sodium chloride infusion (has no administration in time range)  sodium chloride 0.9 % bolus 500 mL (500 mLs Intravenous New Bag/Given 05/02/20 1409)  fentaNYL (SUBLIMAZE) injection 25 mcg (25 mcg Intravenous Given 05/02/20 1410)    ED Course  I  have reviewed the triage vital signs and the nursing notes.  Pertinent labs & imaging results that were available during my care of the patient were reviewed by me and considered in my medical decision making (see chart for details).  Patient seen and examined. Work-up initiated. + orthostatics. Rectal exam with RN and NT chaperones.  Vital signs reviewed and are as follows: BP (!) 131/105 (BP Location: Right Arm)   Pulse (!) 111   Resp 18   SpO2 100%   Orthostatic VS for the past 72 hrs (Last 3 readings):  BP Location  05/02/20 1209 Right Arm   2:19 PM Patient seen with Dr. Regenia Skeeter. Pt is frustrated that her blood counts repeatedly drop and she feels bad. She agrees to transfusion and admission.   1 unit PRBCs ordered.   I spoke with IMTS who will see.   CRITICAL CARE Performed by: Carlisle Cater PA-C Total critical care time: 40 minutes Critical care time was exclusive of separately billable procedures and treating other patients. Critical care was necessary to treat or prevent imminent or life-threatening deterioration. Critical care was time spent personally by me on the following activities: development of treatment plan with patient and/or surrogate as well as nursing, discussions with consultants, evaluation of patient's response to treatment, examination of patient, obtaining history from patient or surrogate, ordering and performing treatments and interventions, ordering and review of laboratory studies, ordering and review of radiographic studies, pulse oximetry and re-evaluation of patient's condition.     MDM Rules/Calculators/A&P                      Admit.   Final Clinical Impression(s) / ED Diagnoses Final diagnoses:  Symptomatic anemia  Gastrointestinal hemorrhage, unspecified gastrointestinal hemorrhage type    Rx / DC Orders ED Discharge Orders    None       Carlisle Cater, PA-C 05/02/20 1421    Sherwood Gambler, MD 05/06/20 [phone]

## 2020-05-02 NOTE — H&P (Addendum)
Date: 05/02/2020               Patient Name:  [name] MRN: [id number]  DOB: Jan 13, 1936 Age / Sex: 84 y.o., female   PCP: [name], MD              Medical Service: Internal Medicine Teaching Service              Attending Physician: Dr. Sid Falcon, MD    First Contact: Mitzie Na, MS IV Pager: [phone]  Second Contact: Dr. Lonia Skinner Pager: [phone]            After Hours (After 5p/  First Contact Pager: [phone]  weekends / holidays): Second Contact Pager: [phone]   Chief Complaint: BRBPR  History of Present Illness: [name] is an 84 yo F w/ a PMHx of CVA with left-sided deficits, COPD, esophagitis, gastritis, and internal hemorrhoids, recent admission for symptomatic anemia and rectal bleeding, h/o endoscopy 2020 with findings suggestive of pill esophagitis and erosive gastritis, colonoscopy 02/2020 with polyp, internal hemorrhoids, and scattered diverticular change, s/p hysterectomy and polypectomy, who presents today for weakness and recurrent BRBPR.   The patient has had two prior admissions in the past couple of months for hematochezia. Her most recent admission was on 04/19 for symptomatic anemia 2/2 hematochezia. GI was consulted, and the plan was to follow-up with them in the outpatient setting with recommendation for hemorrhoid suppositories twice per day. However, [name] reports that she never followed up with her GI doctor and reports that she has been unable to afford the suppositories.  This morning, the patient first noticed bright red blood that was soaking her diaper and on toilet paper when she wipes. She thinks that the bleeding is coming from the "front rim of her vagina." She also endorses 3-4 episodes of black stool since around February but states that this resolved yesterday. Since this AM, she has felt weak and dizzy but has not fainted. Her dizziness worsens when sitting up or standing. She reports one episode of vomiting mucus  but no blood. She reports intermittent episodes of dysuria; she does have dysuria currently and has not peed much in the ED. [name] denies CP, abdominal pain, dysphagia, or changes in her bowel habits (notes that she is "always constipated"). She takes aspirin daily and reports that she had a stroke earlier this year. She is s/p hemorrhoidectomy at age 51 and is also s/p hysterectomy. She is able to feed herself but has difficulty walking without assistance. She is sometimes able to stand by herself. The patient uses a walker and wheelchair for ambulation. There are no other complaints or concerns at this time.  ED hospital course is significant for +orthostatics, positive FOBT, Hgb 6.7, platelets 963, PT 12.4, and INR 1. She was transfused with PRBCs and given a bolus of IV NS.  Meds:  Current Meds  Medication Sig   aspirin EC 81 MG tablet Take 1 tablet (81 mg total) by mouth daily.   Cal Carb-Mag Hydrox-Simeth (ROLAIDS ADVANCED PO) Take 1 tablet by mouth daily as needed (heartburn).   Ibuprofen-diphenhydrAMINE Cit (ADVIL PM) 200-38 MG TABS Take 1 tablet by mouth as needed (for sleep).   Menthol-Methyl Salicylate (MUSCLE RUB) 10-15 % CREA Apply 1 application topically 2 (two) times daily as needed for muscle pain.   polyethylene glycol (MIRALAX / GLYCOLAX) 17 g packet Take 17 g by mouth daily.   senna-docusate (SENOKOT-S) 8.6-50 MG tablet Take 2  tablets by mouth 2 (two) times daily. (Patient taking differently: Take 2 tablets by mouth 2 (two) times daily as needed for mild constipation. )    Allergies: Allergies as of 05/02/2020 - Review Complete 05/02/2020  Allergen Reaction Noted   Ambien [zolpidem] Other (See Comments) 12/20/2015   Codeine Hives and Nausea And Vomiting 06/18/2019   Past Medical History:  Diagnosis Date   Arthritis    CAP (community acquired pneumonia)    multiple admissions   Chronic back pain 08/22/2018   Colitis 03/19/2014   Compression fx, lumbar spine (HCC)     COPD (chronic obstructive pulmonary disease) ([place])    Delirium    Hypertension    On home oxygen therapy 06-14-13   continuos 2.5l/m nasally-24/7   Osteoporosis    Sacral fracture ([place]) 2014    Family History:  Family History  Problem Relation Age of Onset   Hypertension Sister    Diabetes Mellitus II Sister     Social History:  Social History   Tobacco Use   Smoking status: Former Smoker    Types: Cigarettes    Quit date: 06/15/2011    Years since quitting: 8.8   Smokeless tobacco: Never Used  Substance Use Topics   Alcohol use: No   Drug use: No   Review of Systems: A complete ROS was negative except as per HPI.    Physical Exam: Blood pressure 125/89, pulse 81, temperature (!) 97.5 F (36.4 C), temperature source Oral, resp. rate (!) 21, SpO2 100 %. Constitutional: well-developed, well-nourished. NAD, appears comfortable Cardiovascular: Tachycardic. Regular rhythm. No rubs or gallops.  Pulmonary/Chest: CTAB, no wheezes, rales, or rhonchi. Crackles appreciated in the left lower lung field. Abdominal: Umbilical hernia present. Normoactive bowel sounds; no TTP in all four quadrants; no rebound or guarding Extremities: Warm and well perfused.  No edema.  Neurological: A&Ox3, CN II - XII grossly intact.  Genitourinary: Minimal bright red blood seen on diaper. There is some noticeable bright red blood on purewick. Visible external hemorrhoid on rectal exam; soft brown stool without blood appreciated in the rectal vault; no tenderness or palpable masses with DRE. Psychiatric: Normal mood and affect  Labs: CBC    Component Value Date/Time   WBC 9.2 05/02/2020 1141   RBC 2.81 (L) 05/02/2020 1141   HGB 6.7 (LL) 05/02/2020 1141   HCT 23.9 (L) 05/02/2020 1141   PLT 963 (HH) 05/02/2020 1141   MCV 85.1 05/02/2020 1141   MCH 23.8 (L) 05/02/2020 1141   MCHC 28.0 (L) 05/02/2020 1141   RDW 21.2 (H) 05/02/2020 1141   LYMPHSABS 2.0 04/14/2020 2249   MONOABS 0.8 04/14/2020 2249    EOSABS 0.4 04/14/2020 2249   BASOSABS 0.1 04/14/2020 2249   CMP Latest Ref Rng & Units 05/02/2020 04/18/2020 04/17/2020  Glucose 70 - 99 mg/dL 146(H) 106(H) 115(H)  BUN 8 - 23 mg/dL 21 20 25(H)  Creatinine 0.44 - 1.00 mg/dL 0.69 0.72 0.69  Sodium 135 - 145 mmol/L 138 137 139  Potassium 3.5 - 5.1 mmol/L 4.6 4.3 4.1  Chloride 98 - 111 mmol/L 97(L) 97(L) 100  CO2 22 - 32 mmol/L 30 32 29  Calcium 8.9 - 10.3 mg/dL 8.3(L) 8.7(L) 8.2(L)  Total Protein 6.5 - 8.1 g/dL 7.0 - -  Total Bilirubin 0.3 - 1.2 mg/dL 0.3 - -  Alkaline Phos 38 - 126 U/L 105 - -  AST 15 - 41 U/L 18 - -  ALT 0 - 44 U/L 9 - -   PT  12.4 INR 1.0  EKG: Currently pending  CT Abdomen and Pelvis W Contrast, 03/16/2020: IMPRESSION: 1. There is a large amount of stool at the level of the rectum with rectal wall thickening with adjacent free fluid and fat stranding, concerning for stercoral colitis. 2. There is mild-to-moderate left-sided pelviectasis without evidence for obstructing stone. This appearance is relatively stable to improved from prior study. 3. Nonobstructing left-sided nephrolithiasis. 4. There is consolidation at the lung bases bilaterally, right worse than left, which may be due to pneumonia or aspiration. 5. Aortic Atherosclerosis (ICD10-I70.0).  Transabdominal US, 04/16/2020: FINDINGS: The uterus and ovaries were not visualized consistent with the remote history of a hysterectomy. A cyst in the left adnexa measures 2.5 x 1.8 x 2.0 cm, similar in size to a abdomen and pelvis CT from 06/12/2013.   IMPRESSION: Chronic left adnexal cyst, unchanged from 2014 and considered benign.   Assessment & Plan by Problem: SWAY MANGAR is a 84 y.o. female w/ a PMHx of CVA, HTN, COPD, Depression, esophagitis, gastritis and prior episodes of GI bleeding with BRBPR with a hemoglobin of 6.7 today and positive FOBT.   Active Problems:   GIB (gastrointestinal bleeding)  #Symptomatic anemia, GI bleed versus  UTI This is the patient's third admission in the past two months for hematochezia and symptomatic anemia. During her first admission, she underwent colonoscopy which showed colonic diverticula, internal hemorrhoids, and one polyp (tubular adenoma) which was removed. During her second admission in April, our plan was arrangement for outpatient follow-up with GI, which the patient never attended. She reports that she has been unable to afford her suppositories but takes aspirin daily. She reports new onset of hematochezia and dizziness since this AM; specifically, she endorses significant amount of blood in diaper and on toilet paper when wiping. She had a Hgb of 6.7 on admission and was administered 1 Unit of pRBC and given a fluid bolus.   The patient has a history of gastritis and esophagitis and endorses recent episodes of melena which could indicate an upper GI bleed. However, her presentation today is more concerning for a lower GI bleed given her more recent complaints of bright red blood in her diaper and on TP and an acute onset of symptomatic anemia. She denies abdominal pain, but the patient does endorse dysuria today. She is also s/p hysterectomy. Her examination today showed minimal blood in her diaper, a visible external hemorrhoid, and no evidence of bright red blood or melena in the rectal vault; however, FOBT was positive. Items on the differential include internal hemorrhoids, diverticular bleed, a brisk upper GI bleed. However, on examination, the bleeding seems to be coming from the vaginal area since there was blood on her purewick. Diagnoses to consider include UTI versus vaginal tear versus malignancy (however this seems less likely)  Because the patient has had recent imaging studies including colonoscopy, CT abdomen/pelvis, and transabdominal US, will hold off on obtaining further imaging at this time and will consider a GI consult tomorrow morning. Otherwise will transfuse and give  fluids as needed.  -Orthostatic vitals -Telemetry -Trend CBC and CMP -Receiving 1 unit pRBC, f/u post transfusion H/H pending -Consider GI consult in the AM -UA pending -Ferritin, Iron, TIBC, Reticulocytes, blood smear pending -IV protonix BID, likely transition to PO in AM  #COPD #Crackles on exam Patient has a history of COPD and is on 2 L at home. She denies any worsening symptoms today. On examination today, there were crackles in the left lower  lung field. The patient is otherwise asymptomatic. Will plan for CXR for further evaluation.  -CXR pending -Continue supplemental oxygen as needed  #Thrombocytosis The patient has a history of thrombocytosis, now here with platelet count of 963. This was previously thought to be partially due to blood loss and iron deficiency anemias. However, reactivity is thought to be unlikely because of such a high increase in platelet count. Recent JAK2 genotyping in March 2021 was normal. This is unlikely to be contributing to her current symptoms, so we will continue to monitor. Will further investigate in the AM. -Trend CBC  #Chronic Back pain Patient reports a history of chronic back pain and being on Percocet at home. However, during our evaluation, the patient denied any pain. Dr. Sherry Ruffing reviewed [place] and no recent prescriptions were listed. Last admission no pain medications were noted. Will hold off on restarting pain medications for now until we further discuss this with patient. -Tylenol PRN  #DVT prophylaxis -SCDs  #FEN/GI -NPO  Dispo: Admit patient to Inpatient with expected length of stay greater than 2 midnights.  Signed: Orvis Brill, Medical Student 05/02/2020, 5:36 PM  Pager: [phone]   I have seen and examined the patient myself, and I have reviewed the note by Mitzie Na, [place] [facility] and was present during the interview and physical exam.  I have reviewed the note and made any corrections that were needed.     Signed: Asencion Noble, MD 05/02/2020, 6:10 PM

## 2020-05-02 NOTE — ED Notes (Signed)
Patient requested a fan for her room.  Fan ordered @ 1315.

## 2020-05-02 NOTE — ED Notes (Signed)
Pt states no relief with pain medications.  IV did infiltrate and possible given SQ.  Pt very talkative and argumentative about many topics without pain responses noted.

## 2020-05-02 NOTE — ED Triage Notes (Signed)
Per [facility] pt from home w/Rectal bleeding. Pt recently hospitalized for the same a few weeks ago w/Hgb 4. Pt a/o x4  BP 110/60 HR 116 CBG 198

## 2020-05-02 NOTE — ED Notes (Signed)
Help get patient undress on the monitor patient is resting with call bell in reach 

## 2020-05-02 NOTE — Hospital Course (Addendum)
Symptomatic anemia; likely lower GI bleed This is the patient's third admission in the past two months for hematochezia and symptomatic anemia. During her first admission, she underwent colonoscopy which showed colonic diverticula, internal hemorrhoids, and one polyp (tubular adenoma) which was removed. During her second admission in April, our plan was arrangement for outpatient follow-up with GI, which the patient never attended.   She presented on 05/06 for new onset of hematochezia and dizziness. She had a positive FOBT and Hgb of 6.7 on admission and was administered 1 Unit of pRBC and given a fluid bolus in the ED. Posttransfusion Hgb 7.5>>7.9.  Her clinical picture was most concerning for a lower GI bleed given her more recent complaints of bright red blood in her diaper and on TP, acute onset of symptomatic anemia, and positive FOBT in the ED. GI was consulted on 05/07, and their recommendations were to follow-up as needed given that the recent imaging during patient's prior admissions was unrevealing.  There was an initial concern during examination on 05/06 for vaginal bleeding (patient reported blood in "front rim of her vagina," and an exam at that time showed blood on her purewick). However, repeat physical exam and subsequent internal exam on 05/07 were not concerning for a vaginal bleed; of note, she is also s/p TAH.  Patient was discharged on 05/08 with our recommendation to follow-up with GI in the outpatient setting.   2. UTI On admission, patient reported dysuria which was similar to her UTIs in the past. On 05/07, she was placed on ceftriaxone regimen.   3. Thrombocytosis, idiopathic The patient has a history of thrombocytosis, with platelet count of 963 on admission. Platelets have trended 963>>756>>827 today. During her prior admissions, this was thought to be partially due to blood loss and iron deficiency anemias (her platelet count decreased to 756 here after she received a  PRBC transfusion). However, reactivity does not seem to be the sole contributor of her thrombocytosis because of such a high increase in platelet count. JAK2 genotyping in March 2021 was normal. A potential cause of her corrected decreased reticulocyte count is a bone marrow abnormality. ET seems less likely given negative JAK2 testing, but myelodysplastic syndrome could be a potential cause of her elevated platelet counts. Will place a referral to outpatient hematology for further evaluation.

## 2020-05-03 DIAGNOSIS — D649 Anemia, unspecified: Secondary | ICD-10-CM | POA: Diagnosis not present

## 2020-05-03 DIAGNOSIS — K922 Gastrointestinal hemorrhage, unspecified: Secondary | ICD-10-CM | POA: Diagnosis not present

## 2020-05-03 LAB — COMPREHENSIVE METABOLIC PANEL
ALT: 8 U/L (ref 0–44)
AST: 10 U/L — ABNORMAL LOW (ref 15–41)
Albumin: 2.4 g/dL — ABNORMAL LOW (ref 3.5–5.0)
Alkaline Phosphatase: 87 U/L (ref 38–126)
Anion gap: 8 (ref 5–15)
BUN: 13 mg/dL (ref 8–23)
CO2: 30 mmol/L (ref 22–32)
Calcium: 8.4 mg/dL — ABNORMAL LOW (ref 8.9–10.3)
Chloride: 102 mmol/L (ref 98–111)
Creatinine, Ser: 0.6 mg/dL (ref 0.44–1.00)
GFR calc Af Amer: >60 mL/min (ref >60)
GFR calc non Af Amer: >60 mL/min (ref >60)
Glucose, Bld: 103 mg/dL — ABNORMAL HIGH (ref 70–99)
Potassium: 4.1 mmol/L (ref 3.5–5.1)
Sodium: 140 mmol/L (ref 135–145)
Total Bilirubin: 0.4 mg/dL (ref 0.3–1.2)
Total Protein: 6.1 g/dL — ABNORMAL LOW (ref 6.5–8.1)

## 2020-05-03 LAB — CBC
HCT: 25.3 % — ABNORMAL LOW (ref 36.0–46.0)
Hemoglobin: 7.5 g/dL — ABNORMAL LOW (ref 12.0–15.0)
MCH: 24.8 pg — ABNORMAL LOW (ref 26.0–34.0)
MCHC: 29.6 g/dL — ABNORMAL LOW (ref 30.0–36.0)
MCV: 83.5 fL (ref 80.0–100.0)
Platelets: 756 10*3/uL — ABNORMAL HIGH (ref 150–400)
RBC: 3.03 MIL/uL — ABNORMAL LOW (ref 3.87–5.11)
RDW: 19.2 % — ABNORMAL HIGH (ref 11.5–15.5)
WBC: 6.6 10*3/uL (ref 4.0–10.5)
nRBC: 0 % (ref 0.0–0.2)

## 2020-05-03 LAB — TYPE AND SCREEN
ABO/RH(D): O POS
Antibody Screen: NEGATIVE
Unit division: 0

## 2020-05-03 LAB — BPAM RBC
Blood Product Expiration Date: 202105292359
ISSUE DATE / TIME: 202105061431
Unit Type and Rh: 5100

## 2020-05-03 LAB — VITAMIN B12: Vitamin B-12: 194 pg/mL (ref 180–914)

## 2020-05-03 LAB — FOLATE: Folate: 11.5 ng/mL (ref >5.9)

## 2020-05-03 MED ORDER — NYSTATIN 100000 UNIT/GM EX POWD
Freq: Two times a day (BID) | CUTANEOUS | Status: DC
  Filled 2020-05-03: qty 15

## 2020-05-03 MED ORDER — PANTOPRAZOLE SODIUM 40 MG PO TBEC
40.0000 mg | DELAYED_RELEASE_TABLET | Freq: Two times a day (BID) | ORAL | Status: DC
  Administered 2020-05-03: 40 mg via ORAL
  Filled 2020-05-03: qty 1

## 2020-05-03 MED ORDER — SODIUM CHLORIDE 0.9 % IV SOLN
510.0000 mg | Freq: Once | INTRAVENOUS | Status: AC
  Administered 2020-05-03: 510 mg via INTRAVENOUS
  Filled 2020-05-03: qty 17

## 2020-05-03 MED ORDER — SODIUM CHLORIDE 0.9 % IV SOLN
1.0000 g | INTRAVENOUS | Status: DC
  Administered 2020-05-03 – 2020-05-04 (×2): 1 g via INTRAVENOUS
  Filled 2020-05-03: qty 1
  Filled 2020-05-03: qty 10
  Filled 2020-05-03: qty 1

## 2020-05-03 MED ORDER — PANTOPRAZOLE SODIUM 40 MG IV SOLR
40.0000 mg | Freq: Two times a day (BID) | INTRAVENOUS | Status: DC
  Administered 2020-05-03: 22:00:00 40 mg via INTRAVENOUS
  Filled 2020-05-03: qty 40

## 2020-05-03 MED ORDER — MORPHINE SULFATE (PF) 4 MG/ML IV SOLN
4.0000 mg | Freq: Once | INTRAVENOUS | Status: AC
  Administered 2020-05-03: 11:00:00 4 mg via INTRAVENOUS
  Filled 2020-05-03: qty 1

## 2020-05-03 MED ORDER — OXYCODONE-ACETAMINOPHEN 5-325 MG PO TABS
1.0000 | ORAL_TABLET | ORAL | Status: DC | PRN
  Administered 2020-05-03 – 2020-05-04 (×5): 2 via ORAL
  Filled 2020-05-03 (×6): qty 2

## 2020-05-03 MED ORDER — PSYLLIUM 95 % PO PACK
1.0000 | PACK | Freq: Every day | ORAL | Status: DC
  Administered 2020-05-04: 1 via ORAL
  Filled 2020-05-03: qty 1

## 2020-05-03 NOTE — Consult Note (Signed)
Referring Provider: Primary Care Physician:  [name], MD Primary Gastroenterologist:  Dr. Paulita Fujita [facility])  Reason for Consultation:  Anemia, melena  HPI: [name] is a 84 y.o. female with past medical history of esophagitis, constipation, and diverticulosis presenting with anemia and melena.  Patient states that earlier this week, she started having black, loose bowel movements which she describes as "car oil."  She has noted this intermittently since February.  She believes her loose stools were from Miralax.  Melena and loose stools have resolved.  She has not had a bowel movement today.    Over the last few days, she noted bright red vaginal bleeding.  She states that she did not think the bright red blood was coming from her rectum but it was vaginal.  Blood was also noted on the pure wick.  She has had recent dizziness and light-headedness but denies syncope.  She denies any abdominal pain, heartburn, odynophagia, dysphagia, chest pain, decreased appetite, unexplained weight loss.  She is on 81 mg aspirin at home but denies NSAID or blood thinner use.  Last EGD was 05/24/2019 and was pertinent for LA Grade B acute esophagitis, suggestive of pill-induced esophagitis,  and erosive gastritis (from aspirin exposure), as well as low-grade of narrowing Schatzki ring and 2cm hiatal hernia.  Last colonoscopy was 03/20/2020 and was pertinent only for diverticulosis (sigmoid and descending), internal hemorrhoids, and one 66mm polyp ([id number] adenoma).   Past Medical History:  Diagnosis Date  . Arthritis   . CAP (community acquired pneumonia)    multiple admissions  . Chronic back pain 08/22/2018  . Colitis 03/19/2014  . Compression fx, lumbar spine ([place])   . COPD (chronic obstructive pulmonary disease) ([place])   . Delirium   . Hypertension   . On home oxygen therapy 06-14-13   continuos 2.5l/m nasally-24/7  . Osteoporosis   . Sacral fracture ([place]) 2014    Past Surgical  History:  Procedure Laterality Date  . ABDOMINAL HYSTERECTOMY    . BIOPSY  05/24/2019   Procedure: BIOPSY;  Surgeon: Ronald Lobo, MD;  Location: [place];  Service: Endoscopy;;  . COLONOSCOPY WITH PROPOFOL Left 03/20/2020   Procedure: COLONOSCOPY WITH PROPOFOL;  Surgeon: Wilford Corner, MD;  Location: [place];  Service: Gastroenterology;  Laterality: Left;  needs ultraslim colonoscope  . ESOPHAGOGASTRODUODENOSCOPY N/A 06/15/2013   Procedure: ESOPHAGOGASTRODUODENOSCOPY (EGD);  Surgeon: [name], MD;  Location: [facility];  Service: Endoscopy;  Laterality: N/A;  . ESOPHAGOGASTRODUODENOSCOPY (EGD) WITH PROPOFOL N/A 05/24/2019   Procedure: ESOPHAGOGASTRODUODENOSCOPY (EGD) WITH PROPOFOL;  Surgeon: Ronald Lobo, MD;  Location: [place];  Service: Endoscopy;  Laterality: N/A;  . IR GENERIC HISTORICAL  10/15/2016   IR KYPHO LUMBAR INC FX REDUCE BONE BX UNI/BIL CANNULATION INC/IMAGING 10/15/2016 Luanne Bras, MD [facility]-INTERV RAD  . POLYPECTOMY  03/20/2020   Procedure: POLYPECTOMY;  Surgeon: Wilford Corner, MD;  Location: [facility];  Service: Gastroenterology;;  . sacroplasty  06-14-13   05-10-13 IVR [facility] for fracture stabilization    Prior to Admission medications   Medication Sig Start Date End Date Taking? Authorizing Provider  aspirin EC 81 MG tablet Take 1 tablet (81 mg total) by mouth daily. 04/18/20 04/18/21 Yes Earlene Plater, MD  Cal Carb-Mag Hydrox-[facility] ADVANCED PO) Take 1 tablet by mouth daily as needed (heartburn).   Yes [provider]  Ibuprofen-diphenhydrAMINE Cit (ADVIL PM) 200-38 MG TABS Take 1 tablet by mouth as needed (for sleep).   Yes [provider]  Menthol-Methyl Salicylate (  MUSCLE RUB) 10-15 % CREA Apply 1 application topically 2 (two) times daily as needed for muscle pain. 05/26/19  Yes Love, Ivan Anchors, PA-C  polyethylene glycol (MIRALAX / GLYCOLAX) 17 g packet Take 17 g by mouth daily. 03/20/20  Yes Jose Persia, MD  senna-docusate (SENOKOT-S) 8.6-50 MG tablet Take 2 tablets by mouth 2 (two) times daily. Patient taking differently: Take 2 tablets by mouth 2 (two) times daily as needed for mild constipation.  05/26/19  Yes Love, Ivan Anchors, PA-C  amLODipine (NORVASC) 5 MG tablet Take 1 tablet (5 mg total) by mouth daily. Patient not taking: Reported on 03/17/2020 05/26/19   Love, Ivan Anchors, PA-C  atorvastatin (LIPITOR) 20 MG tablet Take 1 tablet (20 mg total) by mouth daily at 6 PM. Patient not taking: Reported on 03/17/2020 05/26/19   Love, Ivan Anchors, PA-C  cyanocobalamin 1000 MCG tablet Take 1 tablet (1,000 mcg total) by mouth daily. Patient not taking: Reported on 03/17/2020 05/26/19   Love, Ivan Anchors, PA-C  gabapentin (NEURONTIN) 100 MG capsule Take 1 capsule (100 mg total) by mouth at bedtime. Patient not taking: Reported on 03/17/2020 05/26/19   Love, Ivan Anchors, PA-C  hydrocortisone (ANUSOL-HC) 25 MG suppository Place 1 suppository (25 mg total) rectally 2 (two) times daily. Patient not taking: Reported on 05/02/2020 04/18/20   Earlene Plater, MD  pantoprazole (PROTONIX) 40 MG tablet TAKE 1 TABLET BY MOUTH DAILY Patient not taking: No sig reported 05/26/19   Bary Leriche, PA-C  psyllium (HYDROCIL/METAMUCIL) 95 % PACK Take 1 packet by mouth 2 (two) times daily. Patient not taking: Reported on 03/17/2020 06/20/19   Georgette Shell, MD    Scheduled Meds: . pantoprazole  40 mg Oral BID   Continuous Infusions: . cefTRIAXone (ROCEPHIN)  IV    . ferumoxytol     PRN Meds:.acetaminophen **OR** acetaminophen, oxyCODONE-acetaminophen  Allergies as of 05/02/2020 - Review Complete 05/02/2020  Allergen Reaction Noted  . Ambien [zolpidem] Other (See Comments) 12/20/2015  . Codeine Hives and Nausea And Vomiting 06/18/2019    Family History  Problem Relation Age of Onset  . Hypertension Sister   . Diabetes Mellitus II Sister     Social History   Socioeconomic History  . Marital status: Divorced     Spouse name: Not on file  . Number of children: Not on file  . Years of education: Not on file  . Highest education level: Not on file  Occupational History  . Not on file  Tobacco Use  . Smoking status: Former Smoker    Types: Cigarettes    Quit date: 06/15/2011    Years since quitting: 8.8  . Smokeless tobacco: Never Used  Substance and Sexual Activity  . Alcohol use: No  . Drug use: No  . Sexual activity: Never  Other Topics Concern  . Not on file  Social History Narrative  . Not on file   Social Determinants of Health   Financial Resource Strain:   . Difficulty of Paying Living Expenses:   Food Insecurity:   . Worried About Charity fundraiser in the Last Year:   . Arboriculturist in the Last Year:   Transportation Needs:   . Film/video editor (Medical):   Marland Kitchen Lack of Transportation (Non-Medical):   Physical Activity:   . Days of Exercise per Week:   . Minutes of Exercise per Session:   Stress:   . Feeling of Stress :   Social Connections:   .  Frequency of Communication with Friends and Family:   . Frequency of Social Gatherings with Friends and Family:   . Attends Religious Services:   . Active Member of Clubs or Organizations:   . Attends Archivist Meetings:   Marland Kitchen Marital Status:   Intimate Partner Violence:   . Fear of Current or Ex-Partner:   . Emotionally Abused:   Marland Kitchen Physically Abused:   . Sexually Abused:     Review of Systems:   Review of Systems  Constitutional: Negative for chills, fever and weight loss.  HENT: Negative for ear pain and sore throat.   Eyes: Negative for blurred vision and pain.  Respiratory: Negative for cough and shortness of breath.   Cardiovascular: Negative for chest pain and palpitations.  Gastrointestinal: Positive for diarrhea and melena. Negative for abdominal pain, blood in stool, constipation, heartburn, nausea and vomiting.  Genitourinary: Negative for dysuria and urgency.  Musculoskeletal: Positive for  back pain (chronic). Negative for myalgias.  Skin: Negative for itching and rash.  Neurological: Positive for dizziness. Negative for loss of consciousness.  Endo/Heme/Allergies: Negative for polydipsia. Does not bruise/bleed easily.  Psychiatric/Behavioral: Negative for substance abuse. The patient is not nervous/anxious.      Physical Exam: Vital signs: Vitals:   05/03/20 0159 05/03/20 0519  BP: (!) 151/65 (!) 135/56  Pulse: 78 72  Resp: 17 17  Temp: 97.7 F (36.5 C) 97.8 F (36.6 C)  SpO2: 99% 99%   Last BM Date: 05/02/20  Physical Exam  Constitutional: She is oriented to person, place, and time. She appears well-developed and well-nourished. No distress.  HENT:  Head: Normocephalic and atraumatic.  Eyes: EOM are normal. No scleral icterus.  Conjunctival pallor  Cardiovascular: Normal rate, regular rhythm and normal heart sounds.  Pulmonary/Chest: Effort normal and breath sounds normal. No respiratory distress.  Abdominal: Soft. Bowel sounds are normal. She exhibits no distension and no mass. There is no abdominal tenderness. There is no rebound and no guarding.  Musculoskeletal:        General: No deformity or edema.     Cervical back: Normal range of motion and neck supple.  Neurological: She is alert and oriented to person, place, and time.  Skin: Skin is warm and dry.  Psychiatric: She has a normal mood and affect. Her behavior is normal.    GI:  Lab Results: Recent Labs    05/02/20 1141 05/02/20 1933 05/03/20 0333  WBC 9.2  --  6.6  HGB 6.7* 7.6* 7.5*  HCT 23.9* 25.8* 25.3*  PLT 963*  --  756*   BMET Recent Labs    05/02/20 1141 05/03/20 0333  NA 138 140  K 4.6 4.1  CL 97* 102  CO2 30 30  GLUCOSE 146* 103*  BUN 21 13  CREATININE 0.69 0.60  CALCIUM 8.3* 8.4*   LFT Recent Labs    05/03/20 0333  PROT 6.1*  ALBUMIN 2.4*  AST 10*  ALT 8  ALKPHOS 87  BILITOT 0.4   PT/INR Recent Labs    05/02/20 1141  LABPROT 12.4  INR 1.0      Studies/Results: DG Chest Port 1 View  Result Date: 05/02/2020 CLINICAL DATA:  Rectal bleeding, shortness of breath EXAM: PORTABLE CHEST 1 VIEW COMPARISON:  05/11/2019 FINDINGS: The heart size and mediastinal contours are within normal limits. Probable small left pleural effusion. The visualized skeletal structures are unremarkable. IMPRESSION: Probable small left pleural effusion. Electronically Signed   By: Eddie Candle M.D.   On:  05/02/2020 17:06    Impression/Plan: Black stools: possibly related to gastritis and esophagitis.  Recommend Protonix 40mg  IV BID.  Patient has had recent EGD and colonoscopy.  No plans for endoscopic intervention at this time.  Acute on chronic anemia: Hgb 6.7 on arrival, now 7.5. Monitor Hgb and transfuse to maintain Hgb >7.  Thrombocytosis: 756K today, 963K on 5/6.  Recommend hematology consultation for thrombocytosis and chronic anemia.  Constipation: discontinue Miralax due to diarrhea and initiate Metamucil qd (ordered).  Vaginal bleeding: possible atrophic vaginitis. Recommend gynecologic consult.  [facility] will sign off.  Please contact us if we can be of any further assistance during this hospital stay.   LOS: 0 days   Salley Slaughter  PA-C 05/03/2020, 1:10 PM  Contact #  [phone]

## 2020-05-03 NOTE — Progress Notes (Signed)
Subjective: [name] is an 84 yo F w/ a PMHx of CVA with left-sided deficits, COPD, esophagitis, gastritis, and internal hemorrhoids, recent admission for symptomatic anemia and rectal bleeding, h/o endoscopy 2020 with findings suggestive of pill esophagitis and erosive gastritis, colonoscopy 02/2020 with polyp, internal hemorrhoids, and scattered diverticular change, s/p hysterectomy and polypectomy, who was admitted yesterday for weakness and recurrent BRBPR with a pre-transfusion Hgb of 6.7 and a positive FOBT.  The patient states that she has been having significant back pain since last night; she reports that she is on Percocet at home. She continues to feel weak but reports that she feels better since having her blood transfusion yesterday. There are no other complaints or concerns at this time.  Objective:  Vital signs in last 24 hours: Vitals:   05/02/20 1815 05/02/20 2118 05/03/20 0159 05/03/20 0519  BP: 138/64 (!) 141/62 (!) 151/65 (!) 135/56  Pulse: 86 82 78 72  Resp: 18 17 17 17   Temp: 98.4 F (36.9 C) 98.3 F (36.8 C) 97.7 F (36.5 C) 97.8 F (36.6 C)  TempSrc: Oral Oral Oral Oral  SpO2: 100% 99% 99% 99%   Weight change:   Intake/Output Summary (Last 24 hours) at 05/03/2020 1103 Last data filed at 05/03/2020 0529 Gross per 24 hour  Intake 302 ml  Output 700 ml  Net -398 ml   Physical Exam Constitutional: well-developed, well-nourished. NAD, appears comfortable Cardiovascular:RRR. No rubs or gallops.  Pulmonary/Chest: CTAB, no wheezes, rales, or rhonchi. Abdominal: Umbilical hernia present. Normoactive bowel sounds; no TTP in all four quadrants; no rebound or guarding Extremities: Warm and well perfused. No edema.  Neurological: A&Ox3, CN II - XII grossly intact.  Genitourinary: Purewick is intact; no signs of blood on diaper, on pad, or on purewick. No blood seen on diaper or pad. Psychiatric: Normal mood and affect   Assessment/Plan: [name] is a 84  y.o. female w/ a PMHx of CVA, HTN, COPD, Depression, esophagitis, gastritis and prior episodes of GI bleeding with BRBPR with pre-transfusion Hgb of 6.7 and positive FOBT, now with Hgb 7.5.  Active Problems:   GIB (gastrointestinal bleeding)  #Symptomatic anemia, GI bleed #UTI This is the patient's third admission in the past two months for hematochezia and symptomatic anemia. During her first admission, she underwent colonoscopy which showed colonic diverticula, internal hemorrhoids, and one polyp (tubular adenoma) which was removed. During her second admission in April, our plan was arrangement for outpatient follow-up with GI, which the patient never attended. She is here for new onset of hematochezia and dizziness since yesterday. She had a Hgb of 6.7 on admission and was administered 1 Unit of pRBC and given a fluid bolus. Her Hgb is now 7.5 posttransfusion.  Her presentation today is most concerning for a lower GI bleed given her more recent complaints of bright red blood in her diaper and on TP, acute onset of symptomatic anemia, and positive FOBT. There was a concern during examination yesterday for potential blood in the vagina, but her examination today did not show signs of bleeding in this area; of note, she is also s/p hysterectomy.  Items on the differential include internal hemorrhoids, diverticular bleed, a brisk upper GI bleed. She also endorses dysuria (patient has had current UTIs in the past and notes that these symptoms are similar to her UTIs in the past).   Because the patient has had recent imaging studies including colonoscopy, CT abdomen/pelvis, and transabdominal US, will hold off on obtaining further  imaging at this time. We have contacted [facility] who will evaluate the patient today.  Otherwise will transfuse and give fluids as needed. We will also treat her UTI with Ceftriaxone regimen at this time.  Of note, her corrected reticulocyte count was 2.3 (L); this  could suggest either nutritional deficiencies or bone marrow abnormalities as potential causes of this. Therefore, we have ordered additional studies (B12, folate, copper) for further evaluation.   -GI is on board; appreciate their recs -Telemetry -Trend CBC and CMP -Blood smear pending -Protonix, 40 mg PO BID -Continue Ceftriaxone 1g q24 @ 200 mL/hour -Continue IV iron   #COPD Patient has a history of COPD and is on 2 L at home. Recent CXR showed a possible small left pleural effusion. We will continue to monitor. -Continue supplemental oxygen as needed   #Thrombocytosis The patient has a history of thrombocytosis, with platelet count of 963 on admission yesterday. During her prior admissions, this was thought to be partially due to blood loss and iron deficiency anemias (her platelet count decreased to 756 here after she received a PRBC transfusion). However, reactivity does not seem to be the sole contributor of her thrombocytosis because of such a high increase in platelet count. JAK2 genotyping in March 2021 was normal.  As mentioned above, a potential cause of her corrected decreased reticulocyte count is a bone marrow abnormality. ET seems less likely given negative JAK2 testing, but myelodysplastic syndrome could be a potential cause of her elevated platelet counts. Will likely place a referral to hematology in the future for further evaluation.  -Trend CBC -Blood smear pending -Consider Heme consult in the near future   #Chronic Back pain Patient reports a history of chronic back pain and being on Percocet at home. The patient endorses significant back pain which started overnight. She was given IV fentanyl which somewhat alleviated the pain. Will give dose of PO percocet today. -Pain control with percocet and tylenol PRN  #DVT prophylaxis -SCDs  #FEN/GI -Thin liquid diet   LOS: 0 days   Orvis Brill, Medical Student 05/03/2020, 11:03 AM

## 2020-05-03 NOTE — Progress Notes (Signed)
I introduced myself to [name] and explained we would like to do a pelvic exam to see if there are any signs of vaginal bleeding which she has been endorsing. She consented.  Physical Exam Exam conducted with a chaperone present.  Genitourinary:    Labia:        Right: No lesion or injury.        Left: No lesion or injury.      Urethra: No prolapse, urethral swelling or urethral lesion.     Vagina: No signs of injury. No vaginal discharge, erythema or bleeding.    Source of bleed does not appear to be from vagina.   Modena Nunnery, DO PGY-2 05/03/20, 4:52 PM

## 2020-05-04 LAB — BASIC METABOLIC PANEL
Anion gap: 6 (ref 5–15)
BUN: 15 mg/dL (ref 8–23)
CO2: 32 mmol/L (ref 22–32)
Calcium: 8.3 mg/dL — ABNORMAL LOW (ref 8.9–10.3)
Chloride: 98 mmol/L (ref 98–111)
Creatinine, Ser: 0.91 mg/dL (ref 0.44–1.00)
GFR calc Af Amer: >60 mL/min (ref >60)
GFR calc non Af Amer: 59 mL/min — ABNORMAL LOW (ref >60)
Glucose, Bld: 109 mg/dL — ABNORMAL HIGH (ref 70–99)
Potassium: 4.1 mmol/L (ref 3.5–5.1)
Sodium: 136 mmol/L (ref 135–145)

## 2020-05-04 LAB — CBC
HCT: 27.4 % — ABNORMAL LOW (ref 36.0–46.0)
Hemoglobin: 7.9 g/dL — ABNORMAL LOW (ref 12.0–15.0)
MCH: 24.5 pg — ABNORMAL LOW (ref 26.0–34.0)
MCHC: 28.8 g/dL — ABNORMAL LOW (ref 30.0–36.0)
MCV: 84.8 fL (ref 80.0–100.0)
Platelets: 827 10*3/uL — ABNORMAL HIGH (ref 150–400)
RBC: 3.23 MIL/uL — ABNORMAL LOW (ref 3.87–5.11)
RDW: 19.6 % — ABNORMAL HIGH (ref 11.5–15.5)
WBC: 7.6 10*3/uL (ref 4.0–10.5)
nRBC: 0 % (ref 0.0–0.2)

## 2020-05-04 MED ORDER — PANTOPRAZOLE SODIUM 40 MG PO TBEC
40.0000 mg | DELAYED_RELEASE_TABLET | Freq: Every day | ORAL | Status: DC
  Administered 2020-05-04: 40 mg via ORAL
  Filled 2020-05-04: qty 1

## 2020-05-04 MED ORDER — CEPHALEXIN 500 MG PO CAPS: 500.0000 mg | ORAL_CAPSULE | Freq: Two times a day (BID) | ORAL | 0 refills | Status: AC

## 2020-05-04 NOTE — Discharge Instructions (Signed)
[name],   It has been a pleasure working with you and we are glad you're feeling better. You were hospitalized for low hemoglobin (low blood counts) and bleeding. This seems to have been coming from your GI tract, we did not see any bleeding from your vaginal area. You were given some blood and iron and your blood counts improved. Your vitals have been stable and you have not had any more bleeding. You will need to continue following up with the stomach doctor for further evaluation.   One of your blood counts, your platelets, were elevated. We did some tests and have not found out what is causing this. You will need to follow up with a hematologist(blood doctor) to further evaluate this.    You are being treated for a possible UTI. Please start taking keflex for the next 3 days.   Follow up with your primary care provider in 1-2 weeks  If your symptoms worsen or you develop new symptoms, please seek medical help whether it is your primary care provider or emergency department.

## 2020-05-04 NOTE — Progress Notes (Addendum)
Subjective: The patient states that she continues to have significant back pain, weakness, and dizziness. Notes that her dysuria has improved since she was started on her antibiotic regimen yesterday. She continues to report concerns that her bleeding is coming from the vaginal area. No other complaints or concerns today.  Objective:  Vital signs in last 24 hours: Vitals:   05/03/20 1508 05/03/20 2140 05/04/20 0500 05/04/20 0507  BP: 138/60 (!) 142/59  (!) 122/51  Pulse: 83 80  73  Resp: 18 16  15   Temp: 98 F (36.7 C) (!) 97.5 F (36.4 C)  97.8 F (36.6 C)  TempSrc: Oral Oral    SpO2: 97% 97%  98%  Weight:   45.6 kg    Weight change:   Intake/Output Summary (Last 24 hours) at 05/04/2020 0900 Last data filed at 05/04/2020 0509 Gross per 24 hour  Intake 877 ml  Output 800 ml  Net 77 ml   Physical Exam Constitutional: well-developed, well-nourished. NAD, appears comfortable Cardiovascular:RRR. No rubs or gallops.  Pulmonary/Chest: CTAB, no wheezes, rales, or rhonchi. Abdominal: Normoactive bowel sounds; no TTP in all four quadrants; no rebound or guarding Extremities: Warm and well perfused. No edema.  Neurological: A&Ox3, CN II - XII grossly intact.  Genitourinary: Purewick is intact; no signs of bleeding on diaper, on pad, or on purewick. Psychiatric: Normal mood and affect   Assessment/Plan: [name] is a 84 y.o. female w/ a PMHx of CVA, HTN, COPD, Depression, esophagitis, gastritis and prior episodes of GI bleeding with BRBPR with pre-transfusion Hgb of 6.7 and positive FOBT, with posttransfusion Hgb 7.5>>7.9 today. Patient is stable for discharge with recommendation for follow-up evaluations with GI and hematology as described below.    Active Problems:   GIB (gastrointestinal bleeding)  #Symptomatic anemia, GI bleed #UTI This is the patient's third admission in the past two months for hematochezia and symptomatic anemia. During her first admission, she  underwent colonoscopy which showed colonic diverticula, internal hemorrhoids, and one polyp (tubular adenoma) which was removed. During her second admission in April, our plan was arrangement for outpatient follow-up with GI, which the patient never attended. She is here for new onset of hematochezia and dizziness since 05/06. She had a Hgb of 6.7 on admission and was administered 1 Unit of pRBC and given a fluid bolus. Her Hgb is now 7.5>>7.9 posttransfusion.  Her presentation is most concerning for a lower GI bleed given her more recent complaints of bright red blood in her diaper and on TP, acute onset of symptomatic anemia, and positive FOBT in the ED. There was a concern during initial examination for potential blood in the vagina, but subsequent examinations since then (including internal examination) have been unremarkable.  Items on the differential include internal hemorrhoids, diverticular bleed, a brisk upper GI bleed. She also endorses dysuria (patient has had current UTIs in the past and notes that these symptoms are similar to her UTIs in the past).  Because the patient has had recent imaging studies including colonoscopy, CT abdomen/pelvis, and transabdominal US, will hold off on obtaining further imaging at this time. GI was consulted yesterday, and they also had no further recommendations for workup of her probable lower GI bleed. At this time, the patient is stable for discharge, and we recommend outpatient follow-up with GI. We had an extensive conversation with the patient today about our recommendation for visit with social work today given patient's reported complaints of difficulty with transportation and with affording her medications.  She has consistently stated that she does not wish to be seen by social work, so will hold off on referring her to these services.  -GI consulted; appreciate their recs. We recommend outpatient GI follow-up after discharge. -Telemetry -Trend CBC and  CMP -Blood smear pending -Protonix, 40 mg PO BID -Continue Ceftriaxone regimen   #COPD Patient has a history of COPD and is on 2 L at home. Recent CXR showed a possible small left pleural effusion. Continue to monitor. -Continue supplemental oxygen as needed   #Thrombocytosis The patient has a history of thrombocytosis, with platelet count of 963 on admission. Platelets have trended 963>>756>>827 today. During her prior admissions, this was thought to be partially due to blood loss and iron deficiency anemias (her platelet count decreased to 756 here after she received a PRBC transfusion). However, reactivity does not seem to be the sole contributor of her thrombocytosis because of such a high increase in platelet count. JAK2 genotyping in March 2021 was normal. A potential cause of her corrected decreased reticulocyte count is a bone marrow abnormality. ET seems less likely given negative JAK2 testing, but myelodysplastic syndrome could be a potential cause of her elevated platelet counts. Will place a referral to outpatient hematology for further evaluation.  -Trend CBC -Blood smear pending -Recommend outpatient hematology consult for further workup   #Chronic Back pain Patient reports a history of chronic back pain and being on Percocet at home. The patient endorses significant back pain which started overnight. She was given IV fentanyl which somewhat alleviated the pain. Will give dose of PO percocet today.  -Pain control with percocet and tylenol PRN   #DVT prophylaxis -SCDs   #FEN/GI -Thin liquid diet   LOS: 0 days   Orvis Brill, Medical Student 05/04/2020, 9:00 AM

## 2020-05-04 NOTE — TOC Transition Note (Signed)
Transition of Care [facility]) - CM/SW Discharge Note   Patient Details  Name: [name] MRN: [id number] Date of Birth: 1936-01-19  Transition of Care [facility]) CM/SW Contact:  Claudie Leach, RN 05/04/2020, 4:03 PM   Clinical Narrative:    Patient to d/c home with son.  RN spoke with son over the phone and confirms he is at home and ready to accept patient.  Patient usually transports with PTAR and requires continuous O2.    Discussed [place] services with patient.  Patient states she was with [facility] and would like to stay with them.  Alwyn Ren with [facility] states patient is not active and they cannot serve right now.  Referral accepted by [facility].   Final next level of care: [place] Barriers to Discharge: No Barriers Identified   Patient Goals and CMS Choice Patient states their goals for this hospitalization and ongoing recovery are:: to get well CMS Medicare.gov Compare Post Acute Care list provided to:: Patient Choice offered to / list presented to : Patient   Discharge Plan and Services    HH Arranged: PT, OT [facility] Agency: [place] Date [place]: 05/04/20 Time [place]: 1602 Representative spoke with at [place]: [facility]   Readmission Risk Interventions Readmission Risk Prevention Plan 06/20/2019 05/01/2019 08/22/2018  Post Dischage Appt - - Patient refused  Medication Screening - - Complete  Transportation Screening Complete Complete Complete  PCP follow-up - - Patient refused  PCP or Specialist Appt within 5-7 Days Complete - -  Home Care Screening Complete - -  Medication Review (RN CM) Complete - -  Medication Review (RN Transport planner) - Referral to Pharmacy -  Some recent data might be hidden

## 2020-05-04 NOTE — Evaluation (Addendum)
Physical Therapy Evaluation Patient Details Name: [name] MRN: [id number] DOB: September 29, 1936 Today's Date: 05/04/2020   History of Present Illness  Pt is an 84 y/o female admitted secondary to BRBPR. Pt found to have symptomatic anemia, GI bleed versus UTI. PMH including but not limited to COPD, HTN and CVA.    Clinical Impression  Pt presented supine in bed with HOB elevated, awake and willing to participate in therapy session. Prior to admission, pt reported that she ambulated short distances with RW and assistance from son, but then later stating that she mostly only performs pivot transfers with assistance from son (I.e. - bed to [facility] or w/c). Pt stated that she does require assistance from her son for all ADLs and IADLs. At the time of evaluation, pt significantly limited secondary to generalized weakness, fatigue and pain. She required two person physical assistance for sit-to-stand and stand-pivot transfers. Pt incontinent of urine and also unable to tolerate gait training this session. Pt stating that she feels she is much weaker than when she was first admitted. At this time, PT recommending pt d/c to SNF for further intensive therapy services prior to returning home with family support. However, pt adamantly refusing SNF.   Of note, pt initially on 2L of O2 via [place] with decrease in SpO2 to as low as 80% with activity. Pt stating that she is usually on 2.5 L of O2 at home. PT adjusted her O2 to 2.5 L. With sitting rest break of several minutes and instruction in pursed-lip breathing, SpO2 increasing to mid 90's. HR stable throughout.  Pt would continue to benefit from skilled physical therapy services at this time while admitted and after d/c to address the below listed limitations in order to improve overall safety and independence with functional mobility.     Follow Up Recommendations SNF;Supervision/Assistance - 24 hour;Other (comment)(if pt refuses SNF, will need HHPT, HHOT and 24/7  assist)    Equipment Recommendations  None recommended by PT    Recommendations for Other Services       Precautions / Restrictions Precautions Precautions: Fall Precaution Comments: on 2.5L of O2 at baseline Restrictions Weight Bearing Restrictions: No      Mobility  Bed Mobility Overal bed mobility: Needs Assistance Bed Mobility: Supine to Sit;Sit to Supine     Supine to sit: Min assist Sit to supine: Min assist   General bed mobility comments: increased time and effort, use of bed functions and bed rails, assistance needed for trunk elevation and to return bilateral LEs fully onto bed  Transfers Overall transfer level: Needs assistance Equipment used: Rolling walker (2 wheeled);2 person hand held assist Transfers: Sit to/from [facility] Sit to Stand: Max assist;Mod assist;+2 physical assistance Stand pivot transfers: Mod assist;+2 physical assistance       General transfer comment: pt standing x2 from EOB with max A with RW; then pt again standing from EOB and from [facility] with 2HHA and mod A x2; mod Ax2 for pivotal movements bed<>BSC as well. Pt with flexed posture throughout and very fearful of falling  Ambulation/Gait             General Gait Details: unable  Stairs            Wheelchair Mobility    Modified Rankin (Stroke Patients Only)       Balance Overall balance assessment: Needs assistance Sitting-balance support: Feet supported Sitting balance-Leahy Scale: Fair     Standing balance support: During functional activity;Bilateral upper extremity  supported Standing balance-Leahy Scale: Poor Standing balance comment: requires external support and bilateral UEs                             Pertinent Vitals/Pain Pain Assessment: Faces Faces Pain Scale: Hurts even more Pain Location: back Pain Descriptors / Indicators: Grimacing;Guarding Pain Intervention(s): Monitored during session;Repositioned    Home  Living Family/patient expects to be discharged to:: Private residence Living Arrangements: Children Available Help at Discharge: Family;Available 24 hours/day Type of Home: House Home Access: Stairs to enter   [employer] of Steps: 1 Home Layout: One level Home Equipment: Walker - 2 wheels;Walker - 4 wheels;Cane - single point;Bedside commode;Wheelchair - manual;Hospital bed      Prior Function Level of Independence: Needs assistance   Gait / Transfers Assistance Needed: pt with varying reports of her PLOF. Initially reporting that she walks with RW and then stating that she only pivots with assistance from her son to a w/c - she is unable to propel herself in the w/c  ADL's / Homemaking Assistance Needed: requires assistance from son for bathing, dressing and meal prep  Comments: requires ambulance transport anytime she leaves her home     Hand Dominance        Extremity/Trunk Assessment   Upper Extremity Assessment Upper Extremity Assessment: Defer to OT evaluation;Generalized weakness    Lower Extremity Assessment Lower Extremity Assessment: Generalized weakness    Cervical / Trunk Assessment Cervical / Trunk Assessment: Kyphotic  Communication   Communication: HOH  Cognition Arousal/Alertness: Awake/alert Behavior During Therapy: Anxious Overall Cognitive Status: Impaired/Different from baseline Area of Impairment: Memory;Following commands;Safety/judgement;Problem solving                     Memory: Decreased short-term memory Following Commands: Follows one step commands with increased time Safety/Judgement: Decreased awareness of deficits;Decreased awareness of safety   Problem Solving: Slow processing;Difficulty sequencing;Requires verbal cues        General Comments      Exercises     Assessment/Plan    PT Assessment Patient needs continued PT services  PT Problem List Decreased range of motion;Decreased strength;Decreased  activity tolerance;Decreased balance;Decreased mobility;Decreased coordination;Decreased knowledge of use of DME;Decreased cognition;Decreased safety awareness;Decreased knowledge of precautions;Cardiopulmonary status limiting activity;Pain       PT Treatment Interventions Stair training;DME instruction;Gait training;Functional mobility training;Therapeutic activities;Therapeutic exercise;Balance training;Neuromuscular re-education;Cognitive remediation;Patient/family education    PT Goals (Current goals can be found in the Care Plan section)  Acute Rehab PT Goals Patient Stated Goal: to figure out what's going on and get stronger PT Goal Formulation: With patient Time For Goal Achievement: 05/18/20 Potential to Achieve Goals: Good    Frequency Min 3X/week   Barriers to discharge        Co-evaluation PT/OT/SLP Co-Evaluation/Treatment: Yes Reason for Co-Treatment: For patient/therapist safety;To address functional/ADL transfers PT goals addressed during session: Mobility/safety with mobility;Balance;Proper use of DME;Strengthening/ROM         AM-PAC PT "6 Clicks" Mobility  Outcome Measure Help needed turning from your back to your side while in a flat bed without using bedrails?: A Little Help needed moving from lying on your back to sitting on the side of a flat bed without using bedrails?: A Little Help needed moving to and from a bed to a chair (including a wheelchair)?: A Lot Help needed standing up from a chair using your arms (e.g., wheelchair or bedside chair)?: A Lot Help needed to walk  in hospital room?: Total Help needed climbing 3-5 steps with a railing? : Total 6 Click Score: 12    End of Session Equipment Utilized During Treatment: Gait belt;Oxygen Activity Tolerance: Patient limited by fatigue Patient left: in bed;with call bell/phone within reach;with bed alarm set Nurse Communication: Mobility status PT Visit Diagnosis: Other abnormalities of gait and mobility  (R26.89);Muscle weakness (generalized) (M62.81)    Time: [id number] PT Time Calculation (min) (ACUTE ONLY): 59 min   Charges:   PT Evaluation $PT Eval Moderate Complexity: 1 Mod PT Treatments $Therapeutic Activity: 8-22 mins        Anastasio Champion, DPT  Acute Rehabilitation Services Pager [phone] Office [place] 05/04/2020, 2:39 PM

## 2020-05-04 NOTE — Discharge Summary (Signed)
Name: [name] MRN: [id number] DOB: 1936-11-20 84 y.o. PCP: [name], MD  Date of Admission: 05/02/2020 11:35 AM Date of Discharge: 05/04/2020  8:59 PM Attending Physician: Gilles Chiquito B  Discharge Diagnosis: 1. Symptomatic anemia 2. COPD on 2L 3. Thrombocytosis 4. Chronic back pain  Discharge Medications: Allergies as of 05/04/2020      Reactions   Ambien [zolpidem] Other (See Comments)   Hallucinations and paranoid   Codeine Hives, Nausea And Vomiting      Medication List    STOP taking these medications   amLODipine 5 MG tablet Commonly known as: NORVASC   atorvastatin 20 MG tablet Commonly known as: LIPITOR   cyanocobalamin 1000 MCG tablet   gabapentin 100 MG capsule Commonly known as: NEURONTIN     TAKE these medications   Advil PM 200-38 MG Tabs Generic drug: Ibuprofen-diphenhydrAMINE Cit Take 1 tablet by mouth as needed (for sleep).   aspirin EC 81 MG tablet Take 1 tablet (81 mg total) by mouth daily.   cephALEXin 500 MG capsule Commonly known as: KEFLEX Take 1 capsule (500 mg total) by mouth 2 (two) times daily for 3 days.   hydrocortisone 25 MG suppository Commonly known as: ANUSOL-HC Place 1 suppository (25 mg total) rectally 2 (two) times daily.   Muscle Rub 10-15 % Crea Apply 1 application topically 2 (two) times daily as needed for muscle pain.   pantoprazole 40 MG tablet Commonly known as: PROTONIX TAKE 1 TABLET BY MOUTH DAILY   polyethylene glycol 17 g packet Commonly known as: MIRALAX / GLYCOLAX Take 17 g by mouth daily.   psyllium 95 % Pack Commonly known as: HYDROCIL/METAMUCIL Take 1 packet by mouth 2 (two) times daily.   ROLAIDS ADVANCED PO Take 1 tablet by mouth daily as needed (heartburn).   senna-docusate 8.6-50 MG tablet Commonly known as: Senokot-S Take 2 tablets by mouth 2 (two) times daily. What changed:   when to take this  reasons to take this       Disposition and follow-up:   Ms.Adriana E  [name] was discharged from [facility] in Stable condition.  At the hospital follow up visit please address:  1.  Symptomatic anemia: Likely 2/2 GI bleed, no evidence on exam of vaginal bleeding, please ensure follow up with GI and repeat CBC.  COPD: On 2L O2, please assess  Thrombocytosis: JAK2 genotype normal. Please refer to hematology for further work up.  Chronic back pain: Pt noted chronic back pain and reports having been on percocet, not in PDMP aware, please address.    2.  Labs / imaging needed at time of follow-up: CBC, BMP  3.  Pending labs/ test needing follow-up: None  Follow-up Appointments: Follow-up Information    [name], MD. Schedule an appointment as soon as possible for a visit in 1 week(s).   Specialty: Family Medicine Contact information: [place] [address] [phone]        Arta Silence, MD. Schedule an appointment as soon as possible for a visit in 2 week(s).   Specialty: Gastroenterology Contact information: [id number] [place] [address] [phone]        [facility], [facility] Follow up.   Specialty: Home Health Services Why: Physical and occupational therapists will contact you from this agency.  Contact information: [place] [address] [phone]           Hospital Course by  problem list: 1. Symptomatic anemia; likely lower GI bleed This is the patient's third admission in the past two months for hematochezia and symptomatic anemia. During her first admission, she underwent colonoscopy which showed colonic diverticula, internal hemorrhoids, and one polyp (tubular adenoma) which was removed. During her second admission in April, our plan was arrangement for outpatient follow-up with GI, which the patient never attended.   She presented on 05/06 for new onset of hematochezia and dizziness. She had a positive FOBT and Hgb of 6.7  on admission and was administered 1 Unit of pRBC and given a fluid bolus in the ED. Posttransfusion Hgb 7.5>>7.9.  Her clinical picture was most concerning for a lower GI bleed given her more recent complaints of bright red blood in her diaper and on TP, acute onset of symptomatic anemia, and positive FOBT in the ED. GI was consulted on 05/07, and their recommendations were to follow-up as needed given that the recent imaging during patient's prior admissions was unrevealing.  There was an initial concern during examination on 05/06 for vaginal bleeding (patient reported blood in "front rim of her vagina," and an exam at that time showed blood on her purewick). However, repeat physical exam and subsequent internal exam on 05/07 were not concerning for a vaginal bleed; of note, she is also s/p TAH. Pelvic ultrasound on 4/20 showed chronic left adnexal cyst, unchanged, and no visualization of uterus and ovaries consistent with hysterectomy.   Patient was discharged on 05/08 with our recommendation to follow-up with GI in the outpatient setting.   2. UTI On admission, patient reported dysuria which was similar to her UTIs in the past. On 05/07, she was placed on ceftriaxone regimen and discharged with Keflex.   3. Thrombocytosis, idiopathic The patient has a history of thrombocytosis, with platelet count of 963 on admission. Platelets have trended 963>>756>>827 today. During her prior admissions, this was thought to be partially due to blood loss and iron deficiency anemias (her platelet count decreased to 756 here after she received a PRBC transfusion). However, reactivity does not seem to be the sole contributor of her thrombocytosis because of such a high increase in platelet count. JAK2 genotyping in March 2021 was normal. A potential cause of her corrected decreased reticulocyte count is a bone marrow abnormality. ET seems less likely given negative JAK2 testing, but myelodysplastic syndrome could be a  potential cause of her elevated platelet counts. Will need a referral to outpatient hematology for further evaluation.    Discharge Vitals:   BP (!) 173/73 (BP Location: Left Arm)   Pulse 98   Temp 98.8 F (37.1 C) (Oral)   Resp 16   Wt 45.6 kg   SpO2 98%   BMI 24.24 kg/m   Pertinent Labs, Studies, and Procedures:  CBC Latest Ref Rng & Units 05/04/2020 05/03/2020 05/02/2020  WBC 4.0 - 10.5 K/uL 7.6 6.6 -  Hemoglobin 12.0 - 15.0 g/dL 7.9(L) 7.5(L) 7.6(L)  Hematocrit 36.0 - 46.0 % 27.4(L) 25.3(L) 25.8(L)  Platelets 150 - 400 K/uL 827(H) 756(H) -   BMP Latest Ref Rng & Units 05/04/2020 05/03/2020 05/02/2020  Glucose 70 - 99 mg/dL 109(H) 103(H) 146(H)  BUN 8 - 23 mg/dL 15 13 21   Creatinine 0.44 - 1.00 mg/dL 0.91 0.60 0.69  Sodium 135 - 145 mmol/L 136 140 138  Potassium 3.5 - 5.1 mmol/L 4.1 4.1 4.6  Chloride 98 - 111 mmol/L 98 102 97(L)  CO2 22 - 32 mmol/L 32 30 30  Calcium 8.9 -  10.3 mg/dL 8.3(L) 8.4(L) 8.3(L)     Discharge Instructions: Discharge Instructions    Call MD for:  difficulty breathing, headache or visual disturbances   Complete by: As directed    Call MD for:  extreme fatigue   Complete by: As directed    Call MD for:  hives   Complete by: As directed    Call MD for:  persistant dizziness or light-headedness   Complete by: As directed    Call MD for:  persistant nausea and vomiting   Complete by: As directed    Call MD for:  redness, tenderness, or signs of infection (pain, swelling, redness, odor or green/yellow discharge around incision site)   Complete by: As directed    Call MD for:  severe uncontrolled pain   Complete by: As directed    Call MD for:  temperature >100.4   Complete by: As directed    Diet - low sodium heart healthy   Complete by: As directed    Increase activity slowly   Complete by: As directed    Increase activity slowly   Complete by: As directed       Signed: Asencion Noble, MD 05/06/2020, 11:14 AM   Pager: [phone]

## 2020-05-04 NOTE — Evaluation (Signed)
Occupational Therapy Evaluation Patient Details Name: [name] MRN: [id number] DOB: 1936-04-04 Today's Date: 05/04/2020    History of Present Illness Pt is an 84 y/o female admitted secondary to BRBPR. Pt found to have symptomatic anemia, GI bleed versus UTI. PMH including but not limited to COPD, HTN and CVA.   Clinical Impression   This 84 y/o female presents with the above. PTA pt living with son, receiving assist for ADL and transfers using RW and to/from wheelchair. Pt now presenting with impaired weakness, decreased standing balance and decreased activity tolerance. Pt requiring two person assist for safe completion of functional transfers, tolerating transfer to/from [facility] today. She requires up to [place] for toileting ADL, minA for seated UB ADL and setup assist for self-feeding tasks. Pt will benefit from continued acute OT services; she is currently at SNF level of care but pt adamant that she is returning home at time of discharge - given pt refusing SNF recommend followup [place] services to maximize her overall safety and independence with ADL and mobility.     Follow Up Recommendations  SNF;Supervision/Assistance - 24 hour(rec HHOT if pt refusing SNF)    Equipment Recommendations  None recommended by OT           Precautions / Restrictions Precautions Precautions: Fall Precaution Comments: on 2.5L of O2 at baseline Restrictions Weight Bearing Restrictions: No      Mobility Bed Mobility Overal bed mobility: Needs Assistance Bed Mobility: Supine to Sit;Sit to Supine     Supine to sit: Min assist Sit to supine: Min assist   General bed mobility comments: increased time and effort, use of bed functions and bed rails, assistance needed for trunk elevation and to return bilateral LEs fully onto bed  Transfers Overall transfer level: Needs assistance Equipment used: Rolling walker (2 wheeled);2 person hand held assist Transfers: Sit to/from [employer] Sit to Stand: Max assist;Mod assist;+2 physical assistance Stand pivot transfers: Mod assist;+2 physical assistance       General transfer comment: pt standing x2 from EOB with max A with RW; then pt again standing from EOB and from [facility] with 2HHA and mod A x2; mod Ax2 for pivotal movements bed<>BSC as well. Pt with flexed posture throughout and very fearful of falling    Balance Overall balance assessment: Needs assistance Sitting-balance support: Feet supported Sitting balance-Leahy Scale: Fair     Standing balance support: During functional activity;Bilateral upper extremity supported Standing balance-Leahy Scale: Poor Standing balance comment: requires external support and bilateral UEs                           ADL either performed or assessed with clinical judgement   ADL Overall ADL's : Needs assistance/impaired Eating/Feeding: Set up;Sitting Eating/Feeding Details (indicate cue type and reason): requires assist to open containers Grooming: Supervision/safety;Set up;Sitting   Upper Body Bathing: Minimal assistance;Sitting   Lower Body Bathing: Maximal assistance;+2 for physical assistance;Sitting/lateral leans;Sit to/from stand   Upper Body Dressing : Minimal assistance;Sitting   Lower Body Dressing: Maximal assistance;+2 for physical assistance;Sitting/lateral leans;Sit to/from stand   Toilet Transfer: Moderate assistance;+2 for physical assistance;Stand-pivot;BSC   Toileting- Clothing Manipulation and Hygiene: +2 for physical assistance;Total assistance;Sitting/lateral lean;Sit to/from stand       Functional mobility during ADLs: Moderate assistance;+2 for physical assistance(stand pivot transfer)       Vision         Perception     Praxis  Pertinent Vitals/Pain Pain Assessment: Faces Faces Pain Scale: Hurts even more Pain Location: back Pain Descriptors / Indicators: Grimacing;Guarding Pain Intervention(s): Monitored during  session;Repositioned     Hand Dominance     Extremity/Trunk Assessment Upper Extremity Assessment Upper Extremity Assessment: Generalized weakness   Lower Extremity Assessment Lower Extremity Assessment: Defer to PT evaluation   Cervical / Trunk Assessment Cervical / Trunk Assessment: Kyphotic   Communication Communication Communication: HOH   Cognition Arousal/Alertness: Awake/alert Behavior During Therapy: Anxious Overall Cognitive Status: Impaired/Different from baseline Area of Impairment: Memory;Following commands;Safety/judgement;Problem solving                     Memory: Decreased short-term memory Following Commands: Follows one step commands with increased time Safety/Judgement: Decreased awareness of deficits;Decreased awareness of safety   Problem Solving: Slow processing;Difficulty sequencing;Requires verbal cues     General Comments       Exercises     Shoulder Instructions      Home Living Family/patient expects to be discharged to:: Private residence Living Arrangements: Children Available Help at Discharge: Family;Available 24 hours/day Type of Home: House Home Access: Stairs to enter [employer] of Steps: 1   Home Layout: One level     Bathroom Shower/Tub: Tub/shower unit         Home Equipment: Environmental consultant - 2 wheels;Walker - 4 wheels;Cane - single point;Bedside commode;Wheelchair - manual;Hospital bed          Prior Functioning/Environment Level of Independence: Needs assistance  Gait / Transfers Assistance Needed: pt with varying reports of her PLOF. Initially reporting that she walks with RW and then stating that she only pivots with assistance from her son to a w/c - she is unable to propel herself in the w/c ADL's / Homemaking Assistance Needed: requires assistance from son for bathing, dressing and meal prep   Comments: requires ambulance transport anytime she leaves her home        OT Problem List: Decreased  strength;Decreased range of motion;Decreased activity tolerance;Impaired balance (sitting and/or standing);Pain;Decreased knowledge of use of DME or AE      OT Treatment/Interventions: Self-care/ADL training;Therapeutic exercise;Therapeutic activities;Cognitive remediation/compensation;Patient/family education;Balance training;DME and/or AE instruction    OT Goals(Current goals can be found in the care plan section) Acute Rehab OT Goals Patient Stated Goal: to figure out what's going on and get stronger OT Goal Formulation: With patient Time For Goal Achievement: 05/18/20 Potential to Achieve Goals: Good  OT Frequency: Min 2X/week   Barriers to D/C:            Co-evaluation PT/OT/SLP Co-Evaluation/Treatment: Yes Reason for Co-Treatment: For patient/therapist safety;To address functional/ADL transfers PT goals addressed during session: Mobility/safety with mobility;Balance;Proper use of DME;Strengthening/ROM OT goals addressed during session: ADL's and self-care      AM-PAC OT "6 Clicks" Daily Activity     Outcome Measure Help from another person eating meals?: A Little Help from another person taking care of personal grooming?: A Little Help from another person toileting, which includes using toliet, bedpan, or urinal?: Total Help from another person bathing (including washing, rinsing, drying)?: A Lot Help from another person to put on and taking off regular upper body clothing?: A Little Help from another person to put on and taking off regular lower body clothing?: A Lot 6 Click Score: 14   End of Session Equipment Utilized During Treatment: Gait belt Nurse Communication: Mobility status  Activity Tolerance: Patient tolerated treatment well Patient left: in bed;with call bell/phone within reach;with bed alarm  set  OT Visit Diagnosis: Other abnormalities of gait and mobility (R26.89);Muscle weakness (generalized) (M62.81);Unsteadiness on feet (R26.81)                Time:  [id number] OT Time Calculation (min): 29 min Charges:  OT General Charges $OT Visit: 1 Visit OT Evaluation $OT Eval Moderate Complexity: [place], OT Acute Rehabilitation Services Pager [phone] Office [place] 05/04/2020, 4:42 PM

## 2020-05-04 NOTE — Progress Notes (Signed)
Attempted to contact son [name] at both cell ([phone]) and home ([phone]), each x2. No answer; no voicemail set up.

## 2020-05-06 LAB — PATHOLOGIST SMEAR REVIEW

## 2020-05-07 LAB — COPPER, SERUM: Copper: 151 ug/dL (ref 80–158)

## 2020-06-17 ENCOUNTER — Other Ambulatory Visit: Payer: Self-pay | Admitting: Family Medicine

## 2021-09-09 ENCOUNTER — Other Ambulatory Visit: Payer: Self-pay

## 2021-09-09 ENCOUNTER — Inpatient Hospital Stay (HOSPITAL_COMMUNITY)
Admission: EM | Admit: 2021-09-09 | Discharge: 2021-09-12 | DRG: 378 | Disposition: A | Discharge status: Home / Self Care | Payer: Medicare Other | Attending: Internal Medicine | Admitting: Internal Medicine

## 2021-09-09 ENCOUNTER — Encounter (HOSPITAL_COMMUNITY): Payer: Self-pay

## 2021-09-09 ENCOUNTER — Inpatient Hospital Stay (HOSPITAL_COMMUNITY): Payer: Medicare Other

## 2021-09-09 DIAGNOSIS — M549 Dorsalgia, unspecified: Secondary | ICD-10-CM | POA: Diagnosis present

## 2021-09-09 DIAGNOSIS — M1990 Unspecified osteoarthritis, unspecified site: Secondary | ICD-10-CM | POA: Diagnosis present

## 2021-09-09 DIAGNOSIS — K922 Gastrointestinal hemorrhage, unspecified: Secondary | ICD-10-CM

## 2021-09-09 DIAGNOSIS — K5791 Diverticulosis of intestine, part unspecified, without perforation or abscess with bleeding: Principal | ICD-10-CM | POA: Diagnosis present

## 2021-09-09 DIAGNOSIS — M810 Age-related osteoporosis without current pathological fracture: Secondary | ICD-10-CM | POA: Diagnosis present

## 2021-09-09 DIAGNOSIS — Z20822 Contact with and (suspected) exposure to covid-19: Secondary | ICD-10-CM | POA: Diagnosis present

## 2021-09-09 DIAGNOSIS — Z87891 Personal history of nicotine dependence: Secondary | ICD-10-CM

## 2021-09-09 DIAGNOSIS — Z833 Family history of diabetes mellitus: Secondary | ICD-10-CM | POA: Diagnosis not present

## 2021-09-09 DIAGNOSIS — G8929 Other chronic pain: Secondary | ICD-10-CM | POA: Diagnosis present

## 2021-09-09 DIAGNOSIS — Z885 Allergy status to narcotic agent status: Secondary | ICD-10-CM | POA: Diagnosis not present

## 2021-09-09 DIAGNOSIS — Z888 Allergy status to other drugs, medicaments and biological substances status: Secondary | ICD-10-CM | POA: Diagnosis not present

## 2021-09-09 DIAGNOSIS — Z79899 Other long term (current) drug therapy: Secondary | ICD-10-CM | POA: Diagnosis not present

## 2021-09-09 DIAGNOSIS — Z8249 Family history of ischemic heart disease and other diseases of the circulatory system: Secondary | ICD-10-CM | POA: Diagnosis not present

## 2021-09-09 DIAGNOSIS — Z23 Encounter for immunization: Secondary | ICD-10-CM | POA: Diagnosis present

## 2021-09-09 DIAGNOSIS — K625 Hemorrhage of anus and rectum: Secondary | ICD-10-CM

## 2021-09-09 DIAGNOSIS — J449 Chronic obstructive pulmonary disease, unspecified: Secondary | ICD-10-CM | POA: Diagnosis present

## 2021-09-09 DIAGNOSIS — Z6821 Body mass index (BMI) 21.0-21.9, adult: Secondary | ICD-10-CM

## 2021-09-09 DIAGNOSIS — D500 Iron deficiency anemia secondary to blood loss (chronic): Secondary | ICD-10-CM | POA: Diagnosis present

## 2021-09-09 DIAGNOSIS — Z7982 Long term (current) use of aspirin: Secondary | ICD-10-CM | POA: Diagnosis not present

## 2021-09-09 DIAGNOSIS — Z9981 Dependence on supplemental oxygen: Secondary | ICD-10-CM | POA: Diagnosis not present

## 2021-09-09 DIAGNOSIS — K5900 Constipation, unspecified: Secondary | ICD-10-CM | POA: Diagnosis present

## 2021-09-09 DIAGNOSIS — D75839 Thrombocytosis, unspecified: Secondary | ICD-10-CM | POA: Diagnosis present

## 2021-09-09 DIAGNOSIS — I10 Essential (primary) hypertension: Secondary | ICD-10-CM | POA: Diagnosis present

## 2021-09-09 DIAGNOSIS — D509 Iron deficiency anemia, unspecified: Secondary | ICD-10-CM

## 2021-09-09 DIAGNOSIS — Z8673 Personal history of transient ischemic attack (TIA), and cerebral infarction without residual deficits: Secondary | ICD-10-CM

## 2021-09-09 DIAGNOSIS — R1310 Dysphagia, unspecified: Secondary | ICD-10-CM | POA: Diagnosis present

## 2021-09-09 DIAGNOSIS — E440 Moderate protein-calorie malnutrition: Secondary | ICD-10-CM | POA: Diagnosis present

## 2021-09-09 DIAGNOSIS — D649 Anemia, unspecified: Secondary | ICD-10-CM

## 2021-09-09 LAB — BASIC METABOLIC PANEL
Anion gap: 12 (ref 5–15)
BUN: 22 mg/dL (ref 8–23)
CO2: 26 mmol/L (ref 22–32)
Calcium: 8.3 mg/dL — ABNORMAL LOW (ref 8.9–10.3)
Chloride: 99 mmol/L (ref 98–111)
Creatinine, Ser: 0.75 mg/dL (ref 0.44–1.00)
GFR, Estimated: >60 mL/min (ref >60)
Glucose, Bld: 93 mg/dL (ref 70–99)
Potassium: 4.2 mmol/L (ref 3.5–5.1)
Sodium: 137 mmol/L (ref 135–145)

## 2021-09-09 LAB — CBC WITH DIFFERENTIAL/PLATELET
Abs Immature Granulocytes: 0.09 10*3/uL — ABNORMAL HIGH (ref 0.00–0.07)
Basophils Absolute: 0 10*3/uL (ref 0.0–0.1)
Basophils Relative: 0 %
Eosinophils Absolute: 0 10*3/uL (ref 0.0–0.5)
Eosinophils Relative: 0 %
HCT: 14.8 % — ABNORMAL LOW (ref 36.0–46.0)
Hemoglobin: 3.6 g/dL — CL (ref 12.0–15.0)
Immature Granulocytes: 1 %
Lymphocytes Relative: 11 %
Lymphs Abs: 1 10*3/uL (ref 0.7–4.0)
MCH: 15.9 pg — ABNORMAL LOW (ref 26.0–34.0)
MCHC: 24.3 g/dL — ABNORMAL LOW (ref 30.0–36.0)
MCV: 65.5 fL — ABNORMAL LOW (ref 80.0–100.0)
Monocytes Absolute: 0.5 10*3/uL (ref 0.1–1.0)
Monocytes Relative: 5 %
Neutro Abs: 7.9 10*3/uL — ABNORMAL HIGH (ref 1.7–7.7)
Neutrophils Relative %: 83 %
Platelets: 821 10*3/uL — ABNORMAL HIGH (ref 150–400)
RBC: 2.26 MIL/uL — ABNORMAL LOW (ref 3.87–5.11)
RDW: 21.2 % — ABNORMAL HIGH (ref 11.5–15.5)
WBC: 9.5 10*3/uL (ref 4.0–10.5)
nRBC: 0.9 % — ABNORMAL HIGH (ref 0.0–0.2)

## 2021-09-09 LAB — PREPARE RBC (CROSSMATCH)

## 2021-09-09 LAB — RESP PANEL BY RT-PCR (FLU A&B, COVID) ARPGX2
Influenza A by PCR: NEGATIVE
Influenza B by PCR: NEGATIVE
SARS Coronavirus 2 by RT PCR: NEGATIVE

## 2021-09-09 LAB — POC OCCULT BLOOD, ED: Fecal Occult Bld: POSITIVE — AB

## 2021-09-09 MED ORDER — SODIUM CHLORIDE 0.9% FLUSH
3.0000 mL | Freq: Two times a day (BID) | INTRAVENOUS | Status: DC
  Administered 2021-09-09 – 2021-09-11 (×3): 3 mL via INTRAVENOUS

## 2021-09-09 MED ORDER — TECHNETIUM TC 99M-LABELED RED BLOOD CELLS IV KIT
25.0000 | PACK | Freq: Once | INTRAVENOUS | Status: AC | PRN
  Administered 2021-09-09: 24.1 via INTRAVENOUS

## 2021-09-09 MED ORDER — ACETAMINOPHEN 325 MG PO TABS
650.0000 mg | ORAL_TABLET | ORAL | Status: DC | PRN
  Administered 2021-09-09 – 2021-09-11 (×7): 650 mg via ORAL
  Filled 2021-09-09 (×7): qty 2

## 2021-09-09 MED ORDER — ACETAMINOPHEN 650 MG RE SUPP: 650.0000 mg | RECTAL | Status: DC | PRN

## 2021-09-09 MED ORDER — INFLUENZA VAC A&B SA ADJ QUAD 0.5 ML IM PRSY
0.5000 mL | PREFILLED_SYRINGE | INTRAMUSCULAR | Status: AC
  Administered 2021-09-10: 0.5 mL via INTRAMUSCULAR
  Filled 2021-09-09: qty 0.5

## 2021-09-09 MED ORDER — MELATONIN 3 MG PO TABS
6.0000 mg | ORAL_TABLET | Freq: Every evening | ORAL | Status: DC | PRN
  Administered 2021-09-09 – 2021-09-10 (×2): 6 mg via ORAL
  Filled 2021-09-09 (×2): qty 2

## 2021-09-09 MED ORDER — SODIUM CHLORIDE 0.9 % IV BOLUS
1000.0000 mL | Freq: Once | INTRAVENOUS | Status: AC
  Administered 2021-09-09: 1000 mL via INTRAVENOUS

## 2021-09-09 MED ORDER — SODIUM CHLORIDE 0.9% IV SOLUTION: Freq: Once | INTRAVENOUS | Status: AC

## 2021-09-09 NOTE — Assessment & Plan Note (Addendum)
-   likely slow/chronic with some rapid bleeding prior to admission; differential includes diverticular bleed (painless per patient) vs hemorrhoids; less likely to be upper given stable hemodynamics and asymptomatic in nature -Nuc med scan negative for active bleed.  Suspicion still likely was diverticular in etiology - now is s/p 4 units PRBC total for admission  -Hemoglobin 10.7 g/dL at time of discharge.  No further bleeding

## 2021-09-09 NOTE — ED Triage Notes (Signed)
Patient brought in via PTAR from home. Lives at home with son. Patient states about 3 days ago she started to loose quite a bit of blood when she uses the bathroom. Patient has hemorrhoids and a sacral pressure sore. Patient A&Ox4

## 2021-09-09 NOTE — Hospital Course (Addendum)
[name] is an 85 yo female with PMH COPD (on 2-3 L O2 at home), HTN, chronic back pain, chronic thrombocytosis who presented to the hospital with complaints of bright red blood per rectum. She was hospitalized last year, May 2021 for similar.  She underwent colonoscopy at that time and was found to have diverticulosis, internal hemorrhoids, and a polyp which was removed.  On work-up in the ER, hemoglobin was found to be 3.6 g/dL.  Previous values show baseline around 8 g/dL. She was hemodynamically stable, BP 124/59.  Temp 98.2, heart rate 111, respirations 16, 100% on 3 L nasal cannula. She was given 1 L normal saline bolus and 2 units of blood were ordered.  Case was discussed with GI and she was recommended to undergo nuc med scan. Scan was negative for active bleed.  She received total of 4 units of PRBC and IV iron load.  Hemoglobin remained stable after blood transfusions, 10.7 g/dL at time of discharge.  She had no further rectal bleeding and remained hemodynamically stable.

## 2021-09-09 NOTE — ED Notes (Signed)
Informed RN of abnormal lab 

## 2021-09-09 NOTE — Assessment & Plan Note (Addendum)
-   suspected iron loss from chronic blood loss - check iron studies: stores low, giving Infed on 9/14 - folate = 8, B12 = 230 (start replacement)

## 2021-09-09 NOTE — ED Provider Notes (Signed)
[facility] DEPT Provider Note   CSN: [id number] Arrival date & time: 09/09/21  1038     History Chief Complaint  Patient presents with   Hemorrhoids    [name] is a 85 y.o. female.  85 yo F with a chief complaints of bright red blood per rectum.  This is been going on for about a week or so now.  Started a bit lighter and then for the past 48 hours has been much more profuse.  Worse yesterday than today.  She said she has had a prior like this previously and was told that she likely has internal hemorrhoids.  She required 6 blood transfusions that her last admission.  She is feeling very weak and fatigued.  She denies any abdominal pain.  Denies fevers.  EMS came yesterday to take her to the hospital but she declined transport.  She received a call from her doctor who strongly encouraged her to come today.  She is on aspirin at home.  Denies other blood thinner use.  Sees Dr. Paulita Fujita in the office for GI.  The history is provided by the patient.  Illness Severity:  Moderate Onset quality:  Gradual Duration:  1 week Timing:  Constant Progression:  Worsening Chronicity:  Chronic Associated symptoms: fatigue   Associated symptoms: no abdominal pain, no chest pain, no congestion, no fever, no headaches, no myalgias, no nausea, no rhinorrhea, no shortness of breath, no vomiting and no wheezing       Past Medical History:  Diagnosis Date   Arthritis    CAP (community acquired pneumonia)    multiple admissions   Chronic back pain 08/22/2018   Colitis 03/19/2014   Compression fx, lumbar spine (HCC)    COPD (chronic obstructive pulmonary disease) ([place])    Delirium    Hypertension    On home oxygen therapy 06-14-13   continuos 2.5l/m nasally-24/7   Osteoporosis    Sacral fracture ([place]) 2014    Patient Active Problem List   Diagnosis Date Noted   GIB (gastrointestinal bleeding) 04/15/2020   Fecal impaction in rectum ([place])    Thrombocytosis  03/16/2020   Pressure injury of skin 03/16/2020   Left knee pain 06/18/2019   Upper GI bleed 06/17/2019   Wrist arthritis 05/28/2019   Acute blood loss anemia    Symptomatic anemia    Supplemental oxygen dependent    Right pontine stroke ([place]) 05/02/2019   Stroke ([place]) 05/01/2019   Acute renal failure superimposed on stage 3 chronic kidney disease ([place]) 05/01/2019   Acute ischemic stroke ([place]) 05/01/2019   AKI (acute kidney injury) ([place]) 08/29/2018   Encephalopathy acute 08/22/2018   Chronic back pain 08/22/2018   Vitamin B12 deficiency 08/19/2018   Dehydration    Tachycardia    Altered mental state 08/18/2018   Closed compression fracture of L1 vertebra ([place])    Acute cystitis without hematuria    Pain management    Intractable pain    Back pain 10/10/2016   L2 vertebral fracture ([place]) 10/10/2016   Acute encephalopathy 12/25/2015   Drug-induced delirium 12/25/2015   Acute on chronic respiratory failure ([place]) 12/25/2015   CAP (community acquired pneumonia) 12/19/2015   Chronic respiratory failure ([place]) 03/19/2014   Depression 03/19/2014   Aspiration pneumonia ([place]) 03/10/2014   Acute respiratory failure with hypoxia ([place]) 03/10/2014   Sepsis ([place]) 03/10/2014   Hypokalemia 03/10/2014   Hypertension 03/10/2014   COPD (chronic obstructive pulmonary disease) ([place]) 03/10/2014   Tobacco abuse  03/10/2014   Normocytic anemia 06/15/2013   Generalized abdominal pain 06/15/2013   Hematochezia 06/15/2013    Past Surgical History:  Procedure Laterality Date   ABDOMINAL HYSTERECTOMY     BIOPSY  05/24/2019   Procedure: BIOPSY;  Surgeon: Ronald Lobo, MD;  Location: [facility];  Service: Endoscopy;;   COLONOSCOPY WITH PROPOFOL Left 03/20/2020   Procedure: COLONOSCOPY WITH PROPOFOL;  Surgeon: Wilford Corner, MD;  Location: [facility];  Service: Gastroenterology;  Laterality: Left;  needs ultraslim colonoscope   ESOPHAGOGASTRODUODENOSCOPY N/A 06/15/2013   Procedure:  ESOPHAGOGASTRODUODENOSCOPY (EGD);  Surgeon: [name], MD;  Location: [facility];  Service: Endoscopy;  Laterality: N/A;   ESOPHAGOGASTRODUODENOSCOPY (EGD) WITH PROPOFOL N/A 05/24/2019   Procedure: ESOPHAGOGASTRODUODENOSCOPY (EGD) WITH PROPOFOL;  Surgeon: Ronald Lobo, MD;  Location: [facility];  Service: Endoscopy;  Laterality: N/A;   IR GENERIC HISTORICAL  10/15/2016   IR KYPHO LUMBAR INC FX REDUCE BONE BX UNI/BIL CANNULATION INC/IMAGING 10/15/2016 Luanne Bras, MD [facility]-INTERV RAD   POLYPECTOMY  03/20/2020   Procedure: POLYPECTOMY;  Surgeon: Wilford Corner, MD;  Location: [facility] ENDOSCOPY;  Service: Gastroenterology;;   sacroplasty  06-14-13   05-10-13 IVR [facility] for fracture stabilization     OB History   No obstetric history on file.     Family History  Problem Relation Age of Onset   Hypertension Sister    Diabetes Mellitus II Sister     Social History   Tobacco Use   Smoking status: Former    Types: Cigarettes    Quit date: 06/15/2011    Years since quitting: 10.2   Smokeless tobacco: Never  Substance Use Topics   Alcohol use: No   Drug use: No    Home Medications Prior to Admission medications   Medication Sig Start Date End Date Taking? Authorizing Provider  Cal Carb-Mag Hydrox-Simeth (ROLAIDS ADVANCED PO) Take 1 tablet by mouth daily as needed (heartburn).    [provider]  hydrocortisone (ANUSOL-HC) 25 MG suppository Place 1 suppository (25 mg total) rectally 2 (two) times daily. Patient not taking: Reported on 05/02/2020 04/18/20   Earlene Plater, MD  Ibuprofen-diphenhydrAMINE Cit (ADVIL PM) 200-38 MG TABS Take 1 tablet by mouth as needed (for sleep).    [provider]  Menthol-Methyl Salicylate (MUSCLE RUB) 10-15 % CREA Apply 1 application topically 2 (two) times daily as needed for muscle pain. 05/26/19   Love, Ivan Anchors, PA-C  pantoprazole (PROTONIX) 40 MG tablet TAKE 1 TABLET BY MOUTH DAILY Patient not taking: No sig reported  05/26/19   Love, Ivan Anchors, PA-C  polyethylene glycol (MIRALAX / GLYCOLAX) 17 g packet Take 17 g by mouth daily. 03/20/20   Jose Persia, MD  psyllium (HYDROCIL/METAMUCIL) 95 % PACK Take 1 packet by mouth 2 (two) times daily. Patient not taking: Reported on 03/17/2020 06/20/19   Georgette Shell, MD  senna-docusate (SENOKOT-S) 8.6-50 MG tablet Take 2 tablets by mouth 2 (two) times daily. Patient taking differently: Take 2 tablets by mouth 2 (two) times daily as needed for mild constipation.  05/26/19   Love, Ivan Anchors, PA-C    Allergies    Ambien [zolpidem] and Codeine  Review of Systems   Review of Systems  Constitutional:  Positive for fatigue. Negative for chills and fever.  HENT:  Negative for congestion and rhinorrhea.   Eyes:  Negative for redness and visual disturbance.  Respiratory:  Negative for shortness of breath and wheezing.   Cardiovascular:  Negative for chest pain and palpitations.  Gastrointestinal:  Positive  for anal bleeding and rectal pain. Negative for abdominal pain, nausea and vomiting.  Genitourinary:  Negative for dysuria and urgency.  Musculoskeletal:  Negative for arthralgias and myalgias.  Skin:  Negative for pallor and wound.  Neurological:  Positive for light-headedness. Negative for dizziness and headaches.   Physical Exam Updated Vital Signs BP (!) 117/44   Pulse 78   Temp 98.2 F (36.8 C) (Oral)   Resp 16   Ht '4\' 6"'$  (1.372 m)   Wt 52.2 kg   SpO2 100%   BMI 27.73 kg/m   Physical Exam Vitals and nursing note reviewed.  Constitutional:      General: She is not in acute distress.    Appearance: She is well-developed. She is not diaphoretic.     Comments: Chronically ill appearing  HENT:     Head: Normocephalic and atraumatic.  Eyes:     Pupils: Pupils are equal, round, and reactive to light.  Cardiovascular:     Rate and Rhythm: Normal rate and regular rhythm.     Heart sounds: No murmur heard.   No friction rub. No gallop.  Pulmonary:      Effort: Pulmonary effort is normal.     Breath sounds: No wheezing or rales.  Abdominal:     General: There is no distension.     Palpations: Abdomen is soft.     Tenderness: There is no abdominal tenderness.  Genitourinary:    Rectum: Guaiac result positive.     Comments: Stage II pressure ulcer to the left of midline.  No bleeding no induration.  External polyp at the anus, no external hemorrhoids.  Soft brown stool in the vault, no gross blood. Musculoskeletal:        General: No tenderness.     Cervical back: Normal range of motion and neck supple.  Skin:    General: Skin is warm and dry.  Neurological:     Mental Status: She is alert and oriented to person, place, and time.  Psychiatric:        Behavior: Behavior normal.    ED Results / Procedures / Treatments   Labs (all labs ordered are listed, but only abnormal results are displayed) Labs Reviewed  CBC WITH DIFFERENTIAL/PLATELET - Abnormal; Notable for the following components:      Result Value   RBC 2.26 (*)    Hemoglobin 3.6 (*)    HCT 14.8 (*)    MCV 65.5 (*)    MCH 15.9 (*)    MCHC 24.3 (*)    RDW 21.2 (*)    Platelets 821 (*)    nRBC 0.9 (*)    Neutro Abs 7.9 (*)    Abs Immature Granulocytes 0.09 (*)    All other components within normal limits  BASIC METABOLIC PANEL - Abnormal; Notable for the following components:   Calcium 8.3 (*)    All other components within normal limits  POC OCCULT BLOOD, ED - Abnormal; Notable for the following components:   Fecal Occult Bld POSITIVE (*)    All other components within normal limits  RESP PANEL BY RT-PCR (FLU A&B, COVID) ARPGX2  TYPE AND SCREEN  PREPARE RBC (CROSSMATCH)    EKG None  Radiology No results found.  Procedures Procedures   Medications Ordered in ED Medications  0.9 %  sodium chloride infusion (Manually program via Guardrails IV Fluids) (has no administration in time range)  sodium chloride 0.9 % bolus 1,000 mL (1,000 mLs Intravenous  New Bag/Given 09/09/21 1152)  ED Course  I have reviewed the triage vital signs and the nursing notes.  Pertinent labs & imaging results that were available during my care of the patient were reviewed by me and considered in my medical decision making (see chart for details).    MDM Rules/Calculators/A&P                           85 yo F with a chief complaints of bright red blood per rectum.  Going on for a week worsening over the past couple days.  Has been symptomatic with this lightheaded dizzy.  On my exam no obvious hemorrhoidal bleeding externally.  She was rapidly positive with the point-of-care occult.  Will obtain blood work.  Hemoglobin found to be 3.6.  Will transfuse to units of blood.  I discussed case with Dr. Therisa Doyne, gastroenterology recommended nuclear medicine study.  Will come and evaluate the patient at bedside.  Will discuss with medicine for admission.  CRITICAL CARE Performed by: Cecilio Asper   Total critical care time: 35 minutes  Critical care time was exclusive of separately billable procedures and treating other patients.  Critical care was necessary to treat or prevent imminent or life-threatening deterioration.  Critical care was time spent personally by me on the following activities: development of treatment plan with patient and/or surrogate as well as nursing, discussions with consultants, evaluation of patient's response to treatment, examination of patient, obtaining history from patient or surrogate, ordering and performing treatments and interventions, ordering and review of laboratory studies, ordering and review of radiographic studies, pulse oximetry and re-evaluation of patient's condition.  The patients results and plan were reviewed and discussed.   Any x-rays performed were independently reviewed by myself.   Differential diagnosis were considered with the presenting HPI.  Medications  0.9 %  sodium chloride infusion (Manually  program via Guardrails IV Fluids) (has no administration in time range)  sodium chloride 0.9 % bolus 1,000 mL (1,000 mLs Intravenous New Bag/Given 09/09/21 1152)    Vitals:   09/09/21 1054 09/09/21 1145 09/09/21 1200 09/09/21 1300  BP: (!) 124/59 (!) 126/53 (!) 126/48 (!) 117/44  Pulse: (!) 111 (!) 104 93 78  Resp: 16  16   Temp: 98.2 F (36.8 C)     TempSrc: Oral     SpO2: 100% 100% 100% 100%  Weight:      Height:        Final diagnoses:  Lower GI bleed  Bright red blood per rectum  Symptomatic anemia    Admission/ observation were discussed with the admitting physician, patient and/or family and they are comfortable with the plan.   Final Clinical Impression(s) / ED Diagnoses Final diagnoses:  Lower GI bleed  Bright red blood per rectum  Symptomatic anemia    Rx / DC Orders ED Discharge Orders     None        Deno Etienne, DO 09/09/21 1306

## 2021-09-09 NOTE — Assessment & Plan Note (Addendum)
-   remained hemodynamically stable

## 2021-09-09 NOTE — Consult Note (Signed)
[facility] Gastroenterology Consult  Referring Provider: ER Primary Care Physician:  [name], MD Primary Gastroenterologist: Dr. Paulita Fujita  Reason for Consultation: Rectal bleeding  HPI: [name] is a 85 y.o. female with past medical history of COPD on 2 to 3 L oxygen by nasal cannula at home, hypertension, chronic thrombocytosis and chronic back pain, states that for the last 3 days she has been having significant amount of bright red blood per rectum which prompted her to come to the ER.  Patient states that the bleeding was profuse in amount, she reports seeing clots at times.  She noticed very little stool with blood. Patient was found to have a hemoglobin of 3.6. Colonoscopy from 03/20/2020: Diverticulosis in sigmoid and descending, 1 tubular adenoma removed from transverse colon EGD from 04/2019: Focal erosive esophagitis?  Pill induced, Schatzki's rings  Patient complains of difficulty swallowing solids at home and states that she has to push down solids with plenty of liquids. Patient states that she was advised to take Metamucil, Senokot, MiraLAX and hydrocortisone suppository at home, however has been having regular bowel movements and has not been using any of these mentioned medications. She takes aspirin 81 mg on a daily basis.  Past Medical History:  Diagnosis Date   Arthritis    CAP (community acquired pneumonia)    multiple admissions   Chronic back pain 08/22/2018   Colitis 03/19/2014   Compression fx, lumbar spine (HCC)    COPD (chronic obstructive pulmonary disease) ([place])    Delirium    Hypertension    On home oxygen therapy 06-14-13   continuos 2.5l/m nasally-24/7   Osteoporosis    Sacral fracture ([place]) 2014    Past Surgical History:  Procedure Laterality Date   ABDOMINAL HYSTERECTOMY     BIOPSY  05/24/2019   Procedure: BIOPSY;  Surgeon: Ronald Lobo, MD;  Location: [place];  Service: Endoscopy;;   COLONOSCOPY WITH PROPOFOL Left 03/20/2020    Procedure: COLONOSCOPY WITH PROPOFOL;  Surgeon: Wilford Corner, MD;  Location: [place];  Service: Gastroenterology;  Laterality: Left;  needs ultraslim colonoscope   ESOPHAGOGASTRODUODENOSCOPY N/A 06/15/2013   Procedure: ESOPHAGOGASTRODUODENOSCOPY (EGD);  Surgeon: [name], MD;  Location: [facility];  Service: Endoscopy;  Laterality: N/A;   ESOPHAGOGASTRODUODENOSCOPY (EGD) WITH PROPOFOL N/A 05/24/2019   Procedure: ESOPHAGOGASTRODUODENOSCOPY (EGD) WITH PROPOFOL;  Surgeon: Ronald Lobo, MD;  Location: [place];  Service: Endoscopy;  Laterality: N/A;   IR GENERIC HISTORICAL  10/15/2016   IR KYPHO LUMBAR INC FX REDUCE BONE BX UNI/BIL CANNULATION INC/IMAGING 10/15/2016 Luanne Bras, MD [facility] RAD   POLYPECTOMY  03/20/2020   Procedure: POLYPECTOMY;  Surgeon: Wilford Corner, MD;  Location: [facility];  Service: Gastroenterology;;   sacroplasty  06-14-13   05-10-13 IVR CONE for fracture stabilization    Prior to Admission medications   Medication Sig Start Date End Date Taking? Authorizing Provider  albuterol (VENTOLIN HFA) 108 (90 Base) MCG/ACT inhaler Inhale 2 puffs into the lungs every 4 (four) hours as needed for wheezing, cough or shortness of breath.   Yes [provider]  aspirin EC 81 MG tablet Take 81 mg by mouth daily. Swallow whole.   Yes [provider]  Ibuprofen-diphenhydrAMINE Cit (ADVIL PM) 200-38 MG TABS Take 1 tablet by mouth as needed (for sleep).   Yes [provider]  Menthol-Methyl Salicylate (MUSCLE RUB) 10-15 % CREA Apply 1 application topically 2 (two) times daily as needed for muscle pain. 05/26/19  Yes Love, Ivan Anchors, PA-C  Current Facility-Administered Medications  Medication Dose Route Frequency Provider Last Rate Last Admin   acetaminophen (TYLENOL) tablet 650 mg  650 mg Oral Q4H PRN Dwyane Dee, MD   650 mg at 09/09/21 1519   Or   acetaminophen (TYLENOL) suppository 650 mg  650 mg Rectal Q4H PRN  Dwyane Dee, MD       [START ON 09/10/2021] influenza vaccine adjuvanted (FLUAD) injection 0.5 mL  0.5 mL Intramuscular Tomorrow-1000 Dwyane Dee, MD       Current Outpatient Medications  Medication Sig Dispense Refill   albuterol (VENTOLIN HFA) 108 (90 Base) MCG/ACT inhaler Inhale 2 puffs into the lungs every 4 (four) hours as needed for wheezing, cough or shortness of breath.     aspirin EC 81 MG tablet Take 81 mg by mouth daily. Swallow whole.     Ibuprofen-diphenhydrAMINE Cit (ADVIL PM) 200-38 MG TABS Take 1 tablet by mouth as needed (for sleep).     Menthol-Methyl Salicylate (MUSCLE RUB) 10-15 % CREA Apply 1 application topically 2 (two) times daily as needed for muscle pain.  0    Allergies as of 09/09/2021 - Review Complete 09/09/2021  Allergen Reaction Noted   Ambien [zolpidem] Other (See Comments) 12/20/2015   Codeine Hives and Nausea And Vomiting 06/18/2019    Family History  Problem Relation Age of Onset   Hypertension Sister    Diabetes Mellitus II Sister     Social History   Socioeconomic History   Marital status: Divorced    Spouse name: Not on file   Number of children: Not on file   Years of education: Not on file   Highest education level: Not on file  Occupational History   Not on file  Tobacco Use   Smoking status: Former    Types: Cigarettes    Quit date: 06/15/2011    Years since quitting: 10.2   Smokeless tobacco: Never  Substance and Sexual Activity   Alcohol use: No   Drug use: No   Sexual activity: Never  Other Topics Concern   Not on file  Social History Narrative   Not on file   Social Determinants of Health   Financial Resource Strain: Not on file  Food Insecurity: Not on file  Transportation Needs: Not on file  Physical Activity: Not on file  Stress: Not on file  Social Connections: Not on file  Intimate Partner Violence: Not on file    Review of Systems: Positive for: GI: Described in detail in HPI.    Gen: fatigue,  weakness, malaise, involuntary weight loss,  denies any fever, chills, rigors, night sweats, anorexia, and sleep disorder CV: Denies chest pain, angina, palpitations, syncope, orthopnea, PND, peripheral edema, and claudication. Resp: On 2 to 3 L oxygen via nasal cannula 24 hours a day at home GU : Denies urinary burning, blood in urine, urinary frequency, urinary hesitancy, nocturnal urination, and urinary incontinence. MS: Denies joint pain or swelling.  Denies muscle weakness, cramps, atrophy.  Derm: Denies rash, itching, oral ulcerations, hives, unhealing ulcers.  Psych: Denies depression, anxiety, memory loss, suicidal ideation, hallucinations,  and confusion. Heme: Denies bruising and enlarged lymph nodes. Neuro:  Denies any headaches, dizziness, paresthesias. Endo:  Denies any problems with DM, thyroid, adrenal function.  Physical Exam: Vital signs in last 24 hours: Temp:  [97.7 F (36.5 C)-98.2 F (36.8 C)] 98 F (36.7 C) (09/13 1515) Pulse Rate:  [72-111] 80 (09/13 1515) Resp:  [14-20] 16 (09/13 1515) BP: (117-139)/(44-75) 130/54 (09/13 1515) SpO2:  [  94 %-100 %] 100 % (09/13 1515) Weight:  [52.2 kg] 52.2 kg (09/13 1051)    General:   Elderly, not in distress Head:  Normocephalic and atraumatic. Eyes:  Sclera clear, no icterus.   Prominent pallor Ears:  Normal auditory acuity. Nose:  No deformity, discharge,  or lesions. Mouth:  No deformity or lesions.  Oropharynx pink & moist. Neck:  Supple; no masses or thyromegaly. Lungs:  Clear throughout to auscultation.   No wheezes, crackles, or rhonchi. No acute distress. Heart:  Regular rate and rhythm; no murmurs, clicks, rubs,  or gallops. Extremities:  Without clubbing or edema. Neurologic:  Alert and  oriented x4;  grossly normal neurologically. Skin:  Intact without significant lesions or rashes. Psych:  Alert and cooperative. Normal mood and affect. Abdomen:  Soft, nontender and nondistended. No masses, hepatosplenomegaly  or hernias noted. Normal bowel sounds, without guarding, and without rebound.     Rectal exam: Skin tag, hard, formed brown stool, did not notice any red blood on gloved finger    Lab Results: Recent Labs    09/09/21 1151  WBC 9.5  HGB 3.6*  HCT 14.8*  PLT 821*   BMET Recent Labs    09/09/21 1151  NA 137  K 4.2  CL 99  CO2 26  GLUCOSE 93  BUN 22  CREATININE 0.75  CALCIUM 8.3*   LFT No results for input(s): PROT, ALBUMIN, AST, ALT, ALKPHOS, BILITOT, BILIDIR, IBILI in the last 72 hours. PT/INR No results for input(s): LABPROT, INR in the last 72 hours.  Studies/Results: No results found.  Impression: Painless hematochezia with severe anemia History of diverticulosis and prior GI bleed On aspirin 81 mg at home Stable hemodynamics  Plan: Possible diverticular bleed?  Resolved Agree with blood transfusion, plan to keep hemoglobin at least above 7 Recommend GI NM bleeding scan to identify if there is any evidence of active bleeding, if present may require IR involvement for embolization. Clear liquid diet for now.   LOS: 0 days   Ronnette Juniper, MD  09/09/2021, 3:30 PM

## 2021-09-09 NOTE — H&P (Signed)
History and Physical    [name]  [id number]  DOB: 29-Nov-1936  DOA: 09/09/2021  PCP: [name], MD Patient coming from: home  Chief Complaint: rectal bleeding  HPI:  Ms. Underkoffler is an 85 yo female with PMH COPD (on 2-3 L O2 at home), HTN, chronic back pain, chronic thrombocytosis who presented to the hospital with complaints of bright red blood per rectum. She was hospitalized last year, May 2021 for similar.  She underwent colonoscopy at that time and was found to have diverticulosis, internal hemorrhoids, and a polyp which was removed.  On work-up in the ER, hemoglobin was found to be 3.6 g/dL.  Previous values show baseline around 8 g/dL. She was hemodynamically stable, BP 124/59.  Temp 98.2, heart rate 111, respirations 16, 100% on 3 L nasal cannula. She was given 1 L normal saline bolus and 2 units of blood were ordered.  Case was discussed with GI and she was recommended to undergo nuc med scan.  I have personally briefly reviewed patient's old medical records in [facility] and discussed patient with the ER provider when appropriate/indicated.  Assessment/Plan: * GIB (gastrointestinal bleeding) - likely slow/chronic with some rapid bleeding prior to admission; differential includes diverticular bleed (painless per patient) vs hemorrhoids; less likely to be upper given stable hemodynamics and asymptomatic in nature - trend H/H; 2 units PRBC ordered on admission - follow up nuc med scan  - GI consulted in the ER  Microcytic anemia - suspected iron loss from chronic blood loss - check iron studies - check folate/B12  Thrombocytosis - chronically elevated 500-600 k  -Underwent some work-up last hospitalization.  JAK2 genotype normal.  Patient was recommended to have referral to hematology but no prior notes since last discharge -Other consideration was that it was related to underlying blood loss and iron deficiency anemia  Hypertension - hold any  meds for now in setting of GIB     Code Status: Full   DVT Prophylaxis: SCD     Anticipated disposition is to: home  History: Past Medical History:  Diagnosis Date   Arthritis    CAP (community acquired pneumonia)    multiple admissions   Chronic back pain 08/22/2018   Colitis 03/19/2014   Compression fx, lumbar spine ([place])    COPD (chronic obstructive pulmonary disease) ([place])    Delirium    Hypertension    On home oxygen therapy 06-14-13   continuos 2.5l/m nasally-24/7   Osteoporosis    Sacral fracture ([place]) 2014    Past Surgical History:  Procedure Laterality Date   ABDOMINAL HYSTERECTOMY     BIOPSY  05/24/2019   Procedure: BIOPSY;  Surgeon: Ronald Lobo, MD;  Location: [place];  Service: Endoscopy;;   COLONOSCOPY WITH PROPOFOL Left 03/20/2020   Procedure: COLONOSCOPY WITH PROPOFOL;  Surgeon: Wilford Corner, MD;  Location: [place];  Service: Gastroenterology;  Laterality: Left;  needs ultraslim colonoscope   ESOPHAGOGASTRODUODENOSCOPY N/A 06/15/2013   Procedure: ESOPHAGOGASTRODUODENOSCOPY (EGD);  Surgeon: [name], MD;  Location: [facility];  Service: Endoscopy;  Laterality: N/A;   ESOPHAGOGASTRODUODENOSCOPY (EGD) WITH PROPOFOL N/A 05/24/2019   Procedure: ESOPHAGOGASTRODUODENOSCOPY (EGD) WITH PROPOFOL;  Surgeon: Ronald Lobo, MD;  Location: [place];  Service: Endoscopy;  Laterality: N/A;   IR GENERIC HISTORICAL  10/15/2016   IR KYPHO LUMBAR INC FX REDUCE BONE BX UNI/BIL CANNULATION INC/IMAGING 10/15/2016 Luanne Bras, MD [facility]INTER[facility] RAD   POLYPECTOMY  03/20/2020   Procedure: POLYPECTOMY;  Surgeon: Wilford Corner, MD;  Location:  [place] [facility];  Service: Gastroenterology;;   sacroplasty  06-14-13   05-10-13 IVR [facility] for fracture stabilization     reports that she quit smoking about 10 years ago. Her smoking use included cigarettes. She has never used smokeless tobacco. She reports that she does not drink alcohol and does not use  drugs.  Allergies  Allergen Reactions   Ambien [Zolpidem] Other (See Comments)    Hallucinations and paranoid   Codeine Hives and Nausea And Vomiting    Family History  Problem Relation Age of Onset   Hypertension Sister    Diabetes Mellitus II Sister    Home Medications: Prior to Admission medications   Medication Sig Start Date End Date Taking? Authorizing Provider  albuterol (VENTOLIN HFA) 108 (90 Base) MCG/ACT inhaler Inhale 2 puffs into the lungs every 4 (four) hours as needed for wheezing, cough or shortness of breath.   Yes [provider]  aspirin EC 81 MG tablet Take 81 mg by mouth daily. Swallow whole.   Yes [provider]  Ibuprofen-diphenhydrAMINE Cit (ADVIL PM) 200-38 MG TABS Take 1 tablet by mouth as needed (for sleep).   Yes [provider]  Menthol-Methyl Salicylate (MUSCLE RUB) 10-15 % CREA Apply 1 application topically 2 (two) times daily as needed for muscle pain. 05/26/19  Yes Love, Ivan Anchors, PA-C    Review of Systems:  Pertinent items noted in HPI and remainder of comprehensive ROS otherwise negative.  Physical Exam: Vitals:   09/09/21 1330 09/09/21 1345 09/09/21 1359 09/09/21 1415  BP: (!) 119/58 (!) 131/57 (!) 138/56 132/70  Pulse: 72 92 91 92  Resp:  '20 14 16  '$ Temp:   98.1 F (36.7 C) 97.7 F (36.5 C)  TempSrc:   Oral Oral  SpO2: 100% 100%  100%  Weight:      Height:       General appearance: alert, cooperative, and no distress Head: Normocephalic, without obvious abnormality, atraumatic Eyes:  EOMI Lungs: clear to auscultation bilaterally Heart: regular rate and rhythm and S1, S2 normal Abdomen: normal findings: bowel sounds normal and soft, non-tender Extremities:  thin, no edema Skin: mobility and turgor normal Neurologic: Grossly normal  Labs on Admission:  I have personally reviewed following labs and imaging studies Results for orders placed or performed during the hospital encounter of 09/09/21 (from the  past 24 hour(s))  POC occult blood, ED     Status: Abnormal   Collection Time: 09/09/21 11:33 AM  Result Value Ref Range   Fecal Occult Bld POSITIVE (A) NEGATIVE  CBC with Differential     Status: Abnormal   Collection Time: 09/09/21 11:51 AM  Result Value Ref Range   WBC 9.5 4.0 - 10.5 K/uL   RBC 2.26 (L) 3.87 - 5.11 MIL/uL   Hemoglobin 3.6 (LL) 12.0 - 15.0 g/dL   HCT 14.8 (L) 36.0 - 46.0 %   MCV 65.5 (L) 80.0 - 100.0 fL   MCH 15.9 (L) 26.0 - 34.0 pg   MCHC 24.3 (L) 30.0 - 36.0 g/dL   RDW 21.2 (H) 11.5 - 15.5 %   Platelets 821 (H) 150 - 400 K/uL   nRBC 0.9 (H) 0.0 - 0.2 %   Neutrophils Relative % 83 %   Neutro Abs 7.9 (H) 1.7 - 7.7 K/uL   Lymphocytes Relative 11 %   Lymphs Abs 1.0 0.7 - 4.0 K/uL   Monocytes Relative 5 %   Monocytes Absolute 0.5 0.1 - 1.0 K/uL   Eosinophils Relative 0 %  Eosinophils Absolute 0.0 0.0 - 0.5 K/uL   Basophils Relative 0 %   Basophils Absolute 0.0 0.0 - 0.1 K/uL   Immature Granulocytes 1 %   Abs Immature Granulocytes 0.09 (H) 0.00 - 0.07 K/uL   Tear Drop Cells PRESENT    Polychromasia PRESENT    Target Cells PRESENT    Ovalocytes PRESENT   Basic metabolic panel     Status: Abnormal   Collection Time: 09/09/21 11:51 AM  Result Value Ref Range   Sodium 137 135 - 145 mmol/L   Potassium 4.2 3.5 - 5.1 mmol/L   Chloride 99 98 - 111 mmol/L   CO2 26 22 - 32 mmol/L   Glucose, Bld 93 70 - 99 mg/dL   BUN 22 8 - 23 mg/dL   Creatinine, Ser 0.75 0.44 - 1.00 mg/dL   Calcium 8.3 (L) 8.9 - 10.3 mg/dL   GFR, Estimated >60 >60 mL/min   Anion gap 12 5 - 15  Type and screen     Status: None (Preliminary result)   Collection Time: 09/09/21 11:51 AM  Result Value Ref Range   ABO/RH(D) O POS    Antibody Screen NEG    Sample Expiration 09/12/2021,2359    Unit Number [id number]    Blood Component Type RED CELLS,LR    Unit division 00    Status of Unit ALLOCATED    Transfusion Status OK TO TRANSFUSE    Crossmatch Result Compatible    Unit Number  [id number]    Blood Component Type RBC LR PHER1    Unit division 00    Status of Unit ISSUED    Transfusion Status OK TO TRANSFUSE    Crossmatch Result      Compatible Performed at [facility], [place] [address] [place], [place] [address]   Prepare RBC (crossmatch)     Status: None   Collection Time: 09/09/21 12:41 PM  Result Value Ref Range   Order Confirmation      ORDER PROCESSED BY BLOOD BANK Performed at [facility], [place] [address] [place], [place] [address]      Radiological Exams on Admission: No results found. NM GI Blood Loss    (Results Pending)    Consults called:  GI     Dwyane Dee, MD [facility] 09/09/2021, 2:29 PM

## 2021-09-09 NOTE — Progress Notes (Signed)
Pt arrived to unit from nuc med via stretcher. Slid to bed w/ two assist. VSS. Pt oriented to callbell and environment. POC discussed. Pt bathed and purewick placed.

## 2021-09-09 NOTE — Assessment & Plan Note (Signed)
-   chronically elevated 500-600 k  -Underwent some work-up last hospitalization.  JAK2 genotype normal.  Patient was recommended to have referral to hematology but no prior notes since last discharge -Other consideration was that it was related to underlying blood loss and iron deficiency anemia

## 2021-09-10 DIAGNOSIS — K922 Gastrointestinal hemorrhage, unspecified: Secondary | ICD-10-CM | POA: Diagnosis not present

## 2021-09-10 DIAGNOSIS — E440 Moderate protein-calorie malnutrition: Secondary | ICD-10-CM | POA: Insufficient documentation

## 2021-09-10 LAB — BASIC METABOLIC PANEL
Anion gap: 10 (ref 5–15)
BUN: 14 mg/dL (ref 8–23)
CO2: 26 mmol/L (ref 22–32)
Calcium: 8.4 mg/dL — ABNORMAL LOW (ref 8.9–10.3)
Chloride: 99 mmol/L (ref 98–111)
Creatinine, Ser: 0.64 mg/dL (ref 0.44–1.00)
GFR, Estimated: >60 mL/min (ref >60)
Glucose, Bld: 76 mg/dL (ref 70–99)
Potassium: 3.9 mmol/L (ref 3.5–5.1)
Sodium: 135 mmol/L (ref 135–145)

## 2021-09-10 LAB — FERRITIN: Ferritin: 6 ng/mL — ABNORMAL LOW (ref 11–307)

## 2021-09-10 LAB — CBC WITH DIFFERENTIAL/PLATELET
Abs Immature Granulocytes: 0.08 10*3/uL — ABNORMAL HIGH (ref 0.00–0.07)
Basophils Absolute: 0.1 10*3/uL (ref 0.0–0.1)
Basophils Relative: 1 %
Eosinophils Absolute: 0.1 10*3/uL (ref 0.0–0.5)
Eosinophils Relative: 1 %
HCT: 23.6 % — ABNORMAL LOW (ref 36.0–46.0)
Hemoglobin: 6.9 g/dL — CL (ref 12.0–15.0)
Immature Granulocytes: 1 %
Lymphocytes Relative: 15 %
Lymphs Abs: 1.1 10*3/uL (ref 0.7–4.0)
MCH: 21.6 pg — ABNORMAL LOW (ref 26.0–34.0)
MCHC: 29.2 g/dL — ABNORMAL LOW (ref 30.0–36.0)
MCV: 74 fL — ABNORMAL LOW (ref 80.0–100.0)
Monocytes Absolute: 0.6 10*3/uL (ref 0.1–1.0)
Monocytes Relative: 8 %
Neutro Abs: 5.6 10*3/uL (ref 1.7–7.7)
Neutrophils Relative %: 74 %
Platelets: 475 10*3/uL — ABNORMAL HIGH (ref 150–400)
RBC: 3.19 MIL/uL — ABNORMAL LOW (ref 3.87–5.11)
RDW: 24.3 % — ABNORMAL HIGH (ref 11.5–15.5)
WBC: 7.5 10*3/uL (ref 4.0–10.5)
nRBC: 0.8 % — ABNORMAL HIGH (ref 0.0–0.2)

## 2021-09-10 LAB — HEMOGLOBIN AND HEMATOCRIT, BLOOD
HCT: 36 % (ref 36.0–46.0)
HCT: 32.7 % — ABNORMAL LOW (ref 36.0–46.0)
Hemoglobin: 11.1 g/dL — ABNORMAL LOW (ref 12.0–15.0)
Hemoglobin: 10.2 g/dL — ABNORMAL LOW (ref 12.0–15.0)

## 2021-09-10 LAB — VITAMIN B12: Vitamin B-12: 230 pg/mL (ref 180–914)

## 2021-09-10 LAB — FOLATE: Folate: 8 ng/mL (ref >5.9)

## 2021-09-10 LAB — MAGNESIUM: Magnesium: 2 mg/dL (ref 1.7–2.4)

## 2021-09-10 LAB — IRON AND TIBC
Iron: 15 ug/dL — ABNORMAL LOW (ref 28–170)
Saturation Ratios: 4 % — ABNORMAL LOW (ref 10.4–31.8)
TIBC: 363 ug/dL (ref 250–450)
UIBC: 348 ug/dL

## 2021-09-10 LAB — PREPARE RBC (CROSSMATCH)

## 2021-09-10 MED ORDER — PSYLLIUM 95 % PO PACK
1.0000 | PACK | Freq: Three times a day (TID) | ORAL | Status: DC
  Administered 2021-09-10: 1 via ORAL
  Filled 2021-09-10 (×5): qty 1

## 2021-09-10 MED ORDER — SODIUM CHLORIDE 0.9% IV SOLUTION: Freq: Once | INTRAVENOUS | Status: AC

## 2021-09-10 MED ORDER — ADULT MULTIVITAMIN W/MINERALS CH
1.0000 | ORAL_TABLET | Freq: Every day | ORAL | Status: DC
  Administered 2021-09-10 – 2021-09-11 (×2): 1 via ORAL
  Filled 2021-09-10 (×2): qty 1

## 2021-09-10 MED ORDER — VITAMIN B-12 1000 MCG PO TABS: 1000.0000 ug | ORAL_TABLET | Freq: Every day | ORAL | Status: DC

## 2021-09-10 MED ORDER — BOOST / RESOURCE BREEZE PO LIQD CUSTOM
1.0000 | ORAL | Status: DC
  Administered 2021-09-10 – 2021-09-11 (×2): 1 via ORAL

## 2021-09-10 MED ORDER — CYANOCOBALAMIN 1000 MCG/ML IJ SOLN
1000.0000 ug | Freq: Every day | INTRAMUSCULAR | Status: DC
  Administered 2021-09-10 – 2021-09-11 (×2): 1000 ug via INTRAMUSCULAR
  Filled 2021-09-10 (×2): qty 1

## 2021-09-10 MED ORDER — SODIUM CHLORIDE 0.9 % IV SOLN
25.0000 mg | Freq: Once | INTRAVENOUS | Status: AC
  Administered 2021-09-10: 25 mg via INTRAVENOUS
  Filled 2021-09-10: qty 0.5

## 2021-09-10 MED ORDER — SODIUM CHLORIDE 0.9 % IV SOLN
1000.0000 mg | Freq: Once | INTRAVENOUS | Status: AC
  Administered 2021-09-10: 1000 mg via INTRAVENOUS
  Filled 2021-09-10: qty 20

## 2021-09-10 MED ORDER — ENSURE ENLIVE PO LIQD: 237.0000 mL | Freq: Two times a day (BID) | ORAL | Status: DC

## 2021-09-10 NOTE — Progress Notes (Signed)
[facility] Gastroenterology Progress Note  [name] 85 y.o. 06-Sep-1936  CC:  Rectal bleeding, anemia   Subjective: Patient resting comfortably in bed, requesting when she is able to eat food.  Currently still on clear liquids. Patient has not had any further bowel movement since admission, denies any rectal bleeding. States she has a good appetite, denies nausea, vomiting, abdominal pain. Patient normally has bowel movement every 3 to 4 days.  NM GI BLOOD LOSS IMPRESSION: 1. No evidence of active gastrointestinal bleeding.   ROS : Review of Systems  Constitutional:  Positive for weight loss (since stroke last year). Negative for chills and fever.  Respiratory:  Negative for shortness of breath.   Cardiovascular:  Negative for leg swelling.  Gastrointestinal:  Positive for constipation. Negative for abdominal pain, blood in stool (no further since hospital admission), diarrhea, heartburn, melena, nausea and vomiting.  Musculoskeletal:  Negative for falls.     Objective: Vital signs in last 24 hours: Vitals:   09/10/21 0545 09/10/21 0636  BP: 129/66 134/74  Pulse: 73 74  Resp: 18 20  Temp: 97.7 F (36.5 C) 97.9 F (36.6 C)  SpO2: 100% 100%     Physical Exam: Physical Exam Constitutional:      Appearance: Normal appearance.     Comments: elderly  Abdominal:     Palpations: Abdomen is soft.     Comments: Normal bowel sounds, retractable umbilical hernia, non tender obese AB  Neurological:     General: No focal deficit present.     Mental Status: She is alert.     Lab Results: Recent Labs    09/09/21 1151 09/10/21 0413  NA 137 135  K 4.2 3.9  CL 99 99  CO2 26 26  GLUCOSE 93 76  BUN 22 14  CREATININE 0.75 0.64  CALCIUM 8.3* 8.4*  MG  --  2.0   No results for input(s): AST, ALT, ALKPHOS, BILITOT, PROT, ALBUMIN in the last 72 hours. Recent Labs    09/09/21 1151 09/10/21 0413  WBC 9.5 7.5  NEUTROABS 7.9* 5.6  HGB 3.6* 6.9*  HCT 14.8* 23.6*  MCV  65.5* 74.0*  PLT 821* 475*   No results for input(s): LABPROT, INR in the last 72 hours.  Lab Results: Results for orders placed or performed during the hospital encounter of 09/09/21 (from the past 48 hour(s))  POC occult blood, ED     Status: Abnormal   Collection Time: 09/09/21 11:33 AM  Result Value Ref Range   Fecal Occult Bld POSITIVE (A) NEGATIVE  CBC with Differential     Status: Abnormal   Collection Time: 09/09/21 11:51 AM  Result Value Ref Range   WBC 9.5 4.0 - 10.5 K/uL   RBC 2.26 (L) 3.87 - 5.11 MIL/uL   Hemoglobin 3.6 (LL) 12.0 - 15.0 g/dL    Comment: Reticulocyte Hemoglobin testing may be clinically indicated, consider ordering this additional test [id number] THIS CRITICAL RESULT HAS VERIFIED AND BEEN CALLED TO WEST,S. RN BY NICOLE MCCOY ON 09 13 2022 AT [id number], AND HAS BEEN READ BACK. CRITICAL RESULT VERIFIED. RESULT CHECKED.    HCT 14.8 (L) 36.0 - 46.0 %   MCV 65.5 (L) 80.0 - 100.0 fL   MCH 15.9 (L) 26.0 - 34.0 pg   MCHC 24.3 (L) 30.0 - 36.0 g/dL   RDW 21.2 (H) 11.5 - 15.5 %   Platelets 821 (H) 150 - 400 K/uL   nRBC 0.9 (H) 0.0 - 0.2 %   Neutrophils Relative %  83 %   Neutro Abs 7.9 (H) 1.7 - 7.7 K/uL   Lymphocytes Relative 11 %   Lymphs Abs 1.0 0.7 - 4.0 K/uL   Monocytes Relative 5 %   Monocytes Absolute 0.5 0.1 - 1.0 K/uL   Eosinophils Relative 0 %   Eosinophils Absolute 0.0 0.0 - 0.5 K/uL   Basophils Relative 0 %   Basophils Absolute 0.0 0.0 - 0.1 K/uL   Immature Granulocytes 1 %   Abs Immature Granulocytes 0.09 (H) 0.00 - 0.07 K/uL   Tear Drop Cells PRESENT    Polychromasia PRESENT    Target Cells PRESENT    Ovalocytes PRESENT     Comment: Performed at [facility], [place] [address], [place], [place] [id number]  Basic metabolic panel     Status: Abnormal   Collection Time: 09/09/21 11:51 AM  Result Value Ref Range   Sodium 137 135 - 145 mmol/L   Potassium 4.2 3.5 - 5.1 mmol/L   Chloride 99 98 - 111 mmol/L   CO2 26 22 - 32 mmol/L    Glucose, Bld 93 70 - 99 mg/dL    Comment: Glucose reference range applies only to samples taken after fasting for at least 8 hours.   BUN 22 8 - 23 mg/dL   Creatinine, Ser 0.75 0.44 - 1.00 mg/dL   Calcium 8.3 (L) 8.9 - 10.3 mg/dL   GFR, Estimated >60 >60 mL/min    Comment: (NOTE) Calculated using the CKD-EPI Creatinine Equation (2021)    Anion gap 12 5 - 15    Comment: Performed at [facility], [place] [address] [place], [place] [address]  Type and screen     Status: None (Preliminary result)   Collection Time: 09/09/21 11:51 AM  Result Value Ref Range   ABO/RH(D) O POS    Antibody Screen NEG    Sample Expiration 09/12/2021,2359    Unit Number [id number]    Blood Component Type RED CELLS,LR    Unit division 00    Status of Unit ISSUED    Transfusion Status OK TO TRANSFUSE    Crossmatch Result Compatible    Unit Number [id number]    Blood Component Type RBC LR PHER1    Unit division 00    Status of Unit ISSUED    Transfusion Status OK TO TRANSFUSE    Crossmatch Result Compatible    Unit Number [id number]    Blood Component Type RED CELLS,LR    Unit division 00    Status of Unit ISSUED    Transfusion Status OK TO TRANSFUSE    Crossmatch Result      Compatible Performed at [place] [address], [place], [place] [address]   Prepare RBC (crossmatch)     Status: None   Collection Time: 09/09/21 12:41 PM  Result Value Ref Range   Order Confirmation      ORDER PROCESSED BY BLOOD BANK Performed at [facility], [place] [address], [place],  [address]   Resp Panel by RT-PCR (Flu A&B, Covid) Nasopharyngeal Swab     Status: None   Collection Time: 09/09/21  2:07 PM   Specimen: Nasopharyngeal Swab; Nasopharyngeal(NP) swabs in vial transport medium  Result Value Ref Range   SARS Coronavirus 2 by RT PCR NEGATIVE NEGATIVE    Comment: (NOTE) SARS-CoV-2 target nucleic acids are NOT DETECTED.  The SARS-CoV-2  RNA is generally detectable in upper respiratory specimens during the acute phase of infection. The lowest concentration  of SARS-CoV-2 viral copies this assay can detect is 138 copies/mL. A negative result does not preclude SARS-Cov-2 infection and should not be used as the sole basis for treatment or other patient management decisions. A negative result may occur with  improper specimen collection/handling, submission of specimen other than nasopharyngeal swab, presence of viral mutation(s) within the areas targeted by this assay, and inadequate number of viral copies(<138 copies/mL). A negative result must be combined with clinical observations, patient history, and epidemiological information. The expected result is Negative.  Fact Sheet for Patients:  EntrepreneurPulse.com.au  Fact Sheet for Healthcare Providers:  IncredibleEmployment.be  This test is no t yet approved or cleared by the [place] FDA and  has been authorized for detection and/or diagnosis of SARS-CoV-2 by FDA under an Emergency Use Authorization (EUA). This EUA will remain  in effect (meaning this test can be used) for the duration of the COVID-19 declaration under Section 564(b)(1) of the Act, 21 U.S.C.section 360bbb-3(b)(1), unless the authorization is terminated  or revoked sooner.       Influenza A by PCR NEGATIVE NEGATIVE   Influenza B by PCR NEGATIVE NEGATIVE    Comment: (NOTE) The Xpert Xpress SARS-CoV-2/FLU/RSV plus assay is intended as an aid in the diagnosis of influenza from Nasopharyngeal swab specimens and should not be used as a sole basis for treatment. Nasal washings and aspirates are unacceptable for Xpert Xpress SARS-CoV-2/FLU/RSV testing.  Fact Sheet for Patients: EntrepreneurPulse.com.au  Fact Sheet for Healthcare Providers: IncredibleEmployment.be  This test is not yet approved or cleared by the [place]  FDA and has been authorized for detection and/or diagnosis of SARS-CoV-2 by FDA under an Emergency Use Authorization (EUA). This EUA will remain in effect (meaning this test can be used) for the duration of the COVID-19 declaration under Section 564(b)(1) of the Act, 21 U.S.C. section 360bbb-3(b)(1), unless the authorization is terminated or revoked.  Performed at [facility], [place] [address] [place], [place] [address]   Ferritin     Status: Abnormal   Collection Time: 09/10/21  4:13 AM  Result Value Ref Range   Ferritin 6 (L) 11 - 307 ng/mL    Comment: Performed at [facility], [place] [address], [place], [place] [address]  Folate     Status: None   Collection Time: 09/10/21  4:13 AM  Result Value Ref Range   Folate 8.0 >5.9 ng/mL    Comment: Performed at [facility], [place] [address], [place], [place] [address]  Iron and TIBC     Status: Abnormal   Collection Time: 09/10/21  4:13 AM  Result Value Ref Range   Iron 15 (L) 28 - 170 ug/dL   TIBC 363 250 - 450 ug/dL   Saturation Ratios 4 (L) 10.4 - 31.8 %   UIBC 348 ug/dL    Comment: Performed at [facility], [place] [address] [place], [place] [address]  Vitamin B12     Status: None   Collection Time: 09/10/21  4:13 AM  Result Value Ref Range   Vitamin B-12 230 180 - 914 pg/mL    Comment: (NOTE) This assay is not validated for testing neonatal or myeloproliferative syndrome specimens for Vitamin B12 levels. Performed at [facility], [place] [address], [place], [place] [id number]   Basic metabolic panel     Status: Abnormal   Collection Time: 09/10/21  4:13 AM  Result Value Ref Range   Sodium 135 135 - 145 mmol/L  Potassium 3.9 3.5 - 5.1 mmol/L   Chloride 99 98 - 111 mmol/L   CO2 26 22 - 32 mmol/L   Glucose, Bld 76 70 - 99 mg/dL    Comment: Glucose reference range applies only to samples taken after fasting for at least 8 hours.   BUN  14 8 - 23 mg/dL   Creatinine, Ser 0.64 0.44 - 1.00 mg/dL   Calcium 8.4 (L) 8.9 - 10.3 mg/dL   GFR, Estimated >60 >60 mL/min    Comment: (NOTE) Calculated using the CKD-EPI Creatinine Equation (2021)    Anion gap 10 5 - 15    Comment: Performed at [facility], [place] [address], [place], [place] [address]  CBC with Differential/Platelet     Status: Abnormal   Collection Time: 09/10/21  4:13 AM  Result Value Ref Range   WBC 7.5 4.0 - 10.5 K/uL   RBC 3.19 (L) 3.87 - 5.11 MIL/uL   Hemoglobin 6.9 (LL) 12.0 - 15.0 g/dL    Comment: REPEATED TO VERIFY POST TRANSFUSION SPECIMEN Reticulocyte Hemoglobin testing may be clinically indicated, consider ordering this additional test [id number] THIS CRITICAL RESULT HAS VERIFIED AND BEEN CALLED TO RN M ROBERTS BY ALEXIS [place] ON 09 14 2022 AT 0457, AND HAS BEEN READ BACK.     HCT 23.6 (L) 36.0 - 46.0 %   MCV 74.0 (L) 80.0 - 100.0 fL    Comment: POST TRANSFUSION SPECIMEN REPEATED TO VERIFY DELTA CHECK NOTED    MCH 21.6 (L) 26.0 - 34.0 pg   MCHC 29.2 (L) 30.0 - 36.0 g/dL   RDW 24.3 (H) 11.5 - 15.5 %   Platelets 475 (H) 150 - 400 K/uL   nRBC 0.8 (H) 0.0 - 0.2 %   Neutrophils Relative % 74 %   Neutro Abs 5.6 1.7 - 7.7 K/uL   Lymphocytes Relative 15 %   Lymphs Abs 1.1 0.7 - 4.0 K/uL   Monocytes Relative 8 %   Monocytes Absolute 0.6 0.1 - 1.0 K/uL   Eosinophils Relative 1 %   Eosinophils Absolute 0.1 0.0 - 0.5 K/uL   Basophils Relative 1 %   Basophils Absolute 0.1 0.0 - 0.1 K/uL   Immature Granulocytes 1 %   Abs Immature Granulocytes 0.08 (H) 0.00 - 0.07 K/uL   Tear Drop Cells PRESENT    Polychromasia PRESENT    Target Cells PRESENT     Comment: Performed at [facility], [place] [address], [place], [place] [address]  Magnesium     Status: None   Collection Time: 09/10/21  4:13 AM  Result Value Ref Range   Magnesium 2.0 1.7 - 2.4 mg/dL    Comment: Performed at [facility], [place]  [address], [place], [place] [address]  Prepare RBC (crossmatch)     Status: None   Collection Time: 09/10/21  5:27 AM  Result Value Ref Range   Order Confirmation      ORDER PROCESSED BY BLOOD BANK Performed at [place] [address] [place], [place] [address]     Assessment/Plan:  Painless hematochezia with severe anemia History of diverticulosis and prior GI bleed Constipation NM GI blood loss scan negative for active bleeding.  HGB 6.9 (3.6) Baseline appears to be 9 Patient has received a total of 3 packed red blood cells, received last packed red blood cell after hemoglobin 6.9 Continue to monitor H&H with transfusion as needed to maintain hemoglobin greater than 7. BUN 14 (22), Cr 0.64 (  0.75) Continue Metamucil TID for constipation.  Colonoscopy from 03/20/2020: Diverticulosis in sigmoid and descending, 1 tubular adenoma removed from transverse colon EGD from 04/2019: Focal erosive esophagitis?  Pill induced, Schatzki's rings  Likely diverticular bleed that has resolved, continue metamucil, continue to monitor, if rebleeding occurs and patient remains stable can consider repeat colonoscopy however recent normal colonoscopy 02/2020 likely will not be beneficial versus repeat NM GI blood loss scan if she destabilizes.   Vladimir Crofts PA-C 09/10/2021, 8:03 AM  Contact #  [phone]

## 2021-09-10 NOTE — Progress Notes (Signed)
Progress Note    [name]   [id number]  DOB: 1936/06/12  DOA: 09/09/2021     1  PCP: [name], MD  Initial CC: Rectal bleeding  Hospital Course: [name] is an 85 yo female with PMH COPD (on 2-3 L O2 at home), HTN, chronic back pain, chronic thrombocytosis who presented to the hospital with complaints of bright red blood per rectum. She was hospitalized last year, May 2021 for similar.  She underwent colonoscopy at that time and was found to have diverticulosis, internal hemorrhoids, and a polyp which was removed.  On work-up in the ER, hemoglobin was found to be 3.6 g/dL.  Previous values show baseline around 8 g/dL. She was hemodynamically stable, BP 124/59.  Temp 98.2, heart rate 111, respirations 16, 100% on 3 L nasal cannula. She was given 1 L normal saline bolus and 2 units of blood were ordered.  Case was discussed with GI and she was recommended to undergo nuc med scan. Scan was negative for active bleed.  She received total of 4 units of PRBC and IV iron load.   Interval History:  No events overnight.  No further rectal bleeding.  She remains hemodynamically stable.  She was still upset this morning over the initial event at home which she says scared her.  She is otherwise feeling okay this morning and was eating breakfast comfortably in bed.  ROS: Constitutional: negative for chills and fevers, Respiratory: negative for cough and sputum, Cardiovascular: negative for chest pain, and Gastrointestinal: negative for abdominal pain  Assessment & Plan: * GIB (gastrointestinal bleeding) - likely slow/chronic with some rapid bleeding prior to admission; differential includes diverticular bleed (painless per patient) vs hemorrhoids; less likely to be upper given stable hemodynamics and asymptomatic in nature -Nuc med scan negative for active bleed.  Suspicion still likely was diverticular in etiology - now is s/p 4 units PRBC total for admission  - if  re-bleeds will re-attempt scan but if remains stable with target d/c home tomorrow  Microcytic anemia - suspected iron loss from chronic blood loss - check iron studies: stores low, giving Infed on 9/14 - folate = 8, B12 = 230 (start replacement)  Thrombocytosis - chronically elevated 500-600 k  -Underwent some work-up last hospitalization.  JAK2 genotype normal.  Patient was recommended to have referral to hematology but no prior notes since last discharge -Other consideration was that it was related to underlying blood loss and iron deficiency anemia  Hypertension - hold any meds for now in setting of GIB    Old records reviewed in assessment of this patient  Antimicrobials:   DVT prophylaxis: SCDs Start: 09/09/21 1810   Code Status:   Code Status: Full Code Family Communication: Son, [name]  Disposition Plan: Status is: Inpatient  Remains inpatient appropriate because:Inpatient level of care appropriate due to severity of illness  Dispo: The patient is from: Home              Anticipated d/c is to: Home              Patient currently is not medically stable to d/c.   Difficult to place patient No  Risk of unplanned readmission score: Unplanned Admission- Pilot do not use: 10.68   Objective: Blood pressure (!) 146/57, pulse 75, temperature 97.7 F (36.5 C), temperature source Oral, resp. rate 20, height '4\' 6"'$  (1.372 m), weight 41.4 kg, SpO2 98 %.  Examination: General appearance: alert, cooperative, and no distress Head:  Normocephalic, without obvious abnormality, atraumatic Eyes:  EOMI Lungs: clear to auscultation bilaterally Heart: regular rate and rhythm and S1, S2 normal Abdomen: normal findings: bowel sounds normal and soft, non-tender Extremities:  thin, no edema Skin: mobility and turgor normal Neurologic: Grossly normal Consultants:  GI  Procedures:    Data Reviewed: I have personally reviewed following labs and imaging studies Results for orders  placed or performed during the hospital encounter of 09/09/21 (from the past 24 hour(s))  Ferritin     Status: Abnormal   Collection Time: 09/10/21  4:13 AM  Result Value Ref Range   Ferritin 6 (L) 11 - 307 ng/mL  Folate     Status: None   Collection Time: 09/10/21  4:13 AM  Result Value Ref Range   Folate 8.0 >5.9 ng/mL  Iron and TIBC     Status: Abnormal   Collection Time: 09/10/21  4:13 AM  Result Value Ref Range   Iron 15 (L) 28 - 170 ug/dL   TIBC 363 250 - 450 ug/dL   Saturation Ratios 4 (L) 10.4 - 31.8 %   UIBC 348 ug/dL  Vitamin B12     Status: None   Collection Time: 09/10/21  4:13 AM  Result Value Ref Range   Vitamin B-12 230 180 - 914 pg/mL  Basic metabolic panel     Status: Abnormal   Collection Time: 09/10/21  4:13 AM  Result Value Ref Range   Sodium 135 135 - 145 mmol/L   Potassium 3.9 3.5 - 5.1 mmol/L   Chloride 99 98 - 111 mmol/L   CO2 26 22 - 32 mmol/L   Glucose, Bld 76 70 - 99 mg/dL   BUN 14 8 - 23 mg/dL   Creatinine, Ser 0.64 0.44 - 1.00 mg/dL   Calcium 8.4 (L) 8.9 - 10.3 mg/dL   GFR, Estimated >60 >60 mL/min   Anion gap 10 5 - 15  CBC with Differential/Platelet     Status: Abnormal   Collection Time: 09/10/21  4:13 AM  Result Value Ref Range   WBC 7.5 4.0 - 10.5 K/uL   RBC 3.19 (L) 3.87 - 5.11 MIL/uL   Hemoglobin 6.9 (LL) 12.0 - 15.0 g/dL   HCT 23.6 (L) 36.0 - 46.0 %   MCV 74.0 (L) 80.0 - 100.0 fL   MCH 21.6 (L) 26.0 - 34.0 pg   MCHC 29.2 (L) 30.0 - 36.0 g/dL   RDW 24.3 (H) 11.5 - 15.5 %   Platelets 475 (H) 150 - 400 K/uL   nRBC 0.8 (H) 0.0 - 0.2 %   Neutrophils Relative % 74 %   Neutro Abs 5.6 1.7 - 7.7 K/uL   Lymphocytes Relative 15 %   Lymphs Abs 1.1 0.7 - 4.0 K/uL   Monocytes Relative 8 %   Monocytes Absolute 0.6 0.1 - 1.0 K/uL   Eosinophils Relative 1 %   Eosinophils Absolute 0.1 0.0 - 0.5 K/uL   Basophils Relative 1 %   Basophils Absolute 0.1 0.0 - 0.1 K/uL   Immature Granulocytes 1 %   Abs Immature Granulocytes 0.08 (H) 0.00 - 0.07  K/uL   Tear Drop Cells PRESENT    Polychromasia PRESENT    Target Cells PRESENT   Magnesium     Status: None   Collection Time: 09/10/21  4:13 AM  Result Value Ref Range   Magnesium 2.0 1.7 - 2.4 mg/dL  Prepare RBC (crossmatch)     Status: None   Collection Time: 09/10/21  5:27 AM  Result Value Ref Range   Order Confirmation      ORDER PROCESSED BY BLOOD BANK Performed at [place] [address], [place], [place] [address]   Prepare RBC (crossmatch)     Status: None   Collection Time: 09/10/21 10:38 AM  Result Value Ref Range   Order Confirmation      ORDER PROCESSED BY BLOOD BANK Performed at [facility], [place] [address] [place], [place] [address]     Recent Results (from the past 240 hour(s))  Resp Panel by RT-PCR (Flu A&B, Covid) Nasopharyngeal Swab     Status: None   Collection Time: 09/09/21  2:07 PM   Specimen: Nasopharyngeal Swab; Nasopharyngeal(NP) swabs in vial transport medium  Result Value Ref Range Status   SARS Coronavirus 2 by RT PCR NEGATIVE NEGATIVE Final    Comment: (NOTE) SARS-CoV-2 target nucleic acids are NOT DETECTED.  The SARS-CoV-2 RNA is generally detectable in upper respiratory specimens during the acute phase of infection. The lowest concentration of SARS-CoV-2 viral copies this assay can detect is 138 copies/mL. A negative result does not preclude SARS-Cov-2 infection and should not be used as the sole basis for treatment or other patient management decisions. A negative result may occur with  improper specimen collection/handling, submission of specimen other than nasopharyngeal swab, presence of viral mutation(s) within the areas targeted by this assay, and inadequate number of viral copies(<138 copies/mL). A negative result must be combined with clinical observations, patient history, and epidemiological information. The expected result is Negative.  Fact Sheet for Patients:   EntrepreneurPulse.com.au  Fact Sheet for Healthcare Providers:  IncredibleEmployment.be  This test is no t yet approved or cleared by the [place] FDA and  has been authorized for detection and/or diagnosis of SARS-CoV-2 by FDA under an Emergency Use Authorization (EUA). This EUA will remain  in effect (meaning this test can be used) for the duration of the COVID-19 declaration under Section 564(b)(1) of the Act, 21 U.S.C.section 360bbb-3(b)(1), unless the authorization is terminated  or revoked sooner.       Influenza A by PCR NEGATIVE NEGATIVE Final   Influenza B by PCR NEGATIVE NEGATIVE Final    Comment: (NOTE) The Xpert Xpress SARS-CoV-2/FLU/RSV plus assay is intended as an aid in the diagnosis of influenza from Nasopharyngeal swab specimens and should not be used as a sole basis for treatment. Nasal washings and aspirates are unacceptable for Xpert Xpress SARS-CoV-2/FLU/RSV testing.  Fact Sheet for Patients: EntrepreneurPulse.com.au  Fact Sheet for Healthcare Providers: IncredibleEmployment.be  This test is not yet approved or cleared by the [place] FDA and has been authorized for detection and/or diagnosis of SARS-CoV-2 by FDA under an Emergency Use Authorization (EUA). This EUA will remain in effect (meaning this test can be used) for the duration of the COVID-19 declaration under Section 564(b)(1) of the Act, 21 U.S.C. section 360bbb-3(b)(1), unless the authorization is terminated or revoked.  Performed at [facility], [place] [address] [place], [place] [address]      Radiology Studies: NM GI Blood Loss  Result Date: 09/09/2021 CLINICAL DATA:  Bloody stool for 3 days EXAM: NUCLEAR MEDICINE GASTROINTESTINAL BLEEDING SCAN TECHNIQUE: Sequential abdominal images were obtained following intravenous administration of Tc-12mlabeled red blood cells. RADIOPHARMACEUTICALS:   24.1 mCi Tc-971mertechnetate in-vitro labeled red cells. COMPARISON:  03/16/2020 FINDINGS: There is normal physiologic distribution of radiotracer. There is no evidence of radiotracer accumulation within the abdomen or pelvis to suggest active gastrointestinal bleeding.  IMPRESSION: 1. No evidence of active gastrointestinal bleeding. Electronically Signed   By: Randa Ngo M.D.   On: 09/09/2021 18:30   NM GI Blood Loss  Final Result      Scheduled Meds:  sodium chloride   Intravenous Once   cyanocobalamin  1,000 mcg Intramuscular Daily   Followed by   Derrill Memo ON 09/13/2021] vitamin B-12  1,000 mcg Oral Daily   feeding supplement  1 Container Oral Q24H   feeding supplement  237 mL Oral BID BM   multivitamin with minerals  1 tablet Oral Daily   psyllium  1 packet Oral TID   sodium chloride flush  3 mL Intravenous Q12H   PRN Meds: acetaminophen **OR** acetaminophen, melatonin Continuous Infusions:  iron dextran (INFED/DEXFERRUM) infusion       LOS: 1 day  Time spent: Greater than 50% of the 35 minute visit was spent in counseling/coordination of care for the patient as laid out in the A&P.   Dwyane Dee, MD [facility] 09/10/2021, 3:57 PM

## 2021-09-10 NOTE — Plan of Care (Signed)
  Problem: Education: Goal: Knowledge of General Education information will improve Description: Including pain rating scale, medication(s)/side effects and non-pharmacologic comfort measures Outcome: Progressing   Problem: Health Behavior/Discharge Planning: Goal: Ability to manage health-related needs will improve Outcome: Progressing   Problem: Clinical Measurements: Goal: Ability to maintain clinical measurements within normal limits will improve Outcome: Progressing Goal: Diagnostic test results will improve Outcome: Progressing Goal: Respiratory complications will improve Outcome: Progressing Goal: Cardiovascular complication will be avoided Outcome: Progressing   Problem: Activity: Goal: Risk for activity intolerance will decrease Outcome: Progressing   Problem: Nutrition: Goal: Adequate nutrition will be maintained Outcome: Progressing   Problem: Elimination: Goal: Will not experience complications related to bowel motility Outcome: Progressing   Problem: Pain Managment: Goal: General experience of comfort will improve Outcome: Progressing   Problem: Safety: Goal: Ability to remain free from injury will improve Outcome: Progressing   Problem: Skin Integrity: Goal: Risk for impaired skin integrity will decrease Outcome: Progressing

## 2021-09-10 NOTE — Progress Notes (Signed)
Initial Nutrition Assessment  DOCUMENTATION CODES:   Non-severe (moderate) malnutrition in context of chronic illness  INTERVENTION:  - will order Boost Breeze once/day each supplement provides 250 kcal and 9 grams of protein. - will order Ensure Plus BID, each supplement provides 350 kcal and 13 grams of protein. - will order Magic Cup BID with meals, each supplement provides 290 kcal and 9 grams of protein.     NUTRITION DIAGNOSIS:   Moderate Malnutrition related to chronic illness (COPD) as evidenced by moderate fat depletion, moderate muscle depletion, severe muscle depletion.  GOAL:   Patient will meet greater than or equal to 90% of their needs  MONITOR:   PO intake, Supplement acceptance, Labs, Weight trends, Skin  REASON FOR ASSESSMENT:   Malnutrition Screening Tool  ASSESSMENT:   85 yo female with medical history of COPD (on 2-3 L O2 at home), HTN, chronic back pain, and chronic thrombocytosis. She presented to the ED with complaint of BRBPR. She was hospitalized in 04/2020 for similar. At that time she underwent colonoscopy and was found to have diverticulosis, internal hemorrhoids, and a polyp was removed. In the ED, Hgb was 3.6 g/dl.  Patient ate 25% of breakfast while still on clear liquids this AM. Diet advanced to CLD today at 0805 and to Regular at 1033. She had ordered an omelet prior to RD visit this AM but it had not yet arrived.  Patient denies abdominal pain, pressure, cramping, or nausea. She states she has a good appetite and that she does at baseline. She lives with her son. She mainly eats sweets (candies, cookies, cheesecake) and other snack foods.   She has a walker to aid with ambulation but has had difficulty ambulating leading up to admission d/t weakness and lightheadedness. She recounts one instance of sitting on the toilet x3 hours d/t persistent bleeding from rectum.   Weight yesterday was 91 lb and the most recently documented weight PTA was on  05/04/20 when she weighed 100 lb and on 03/20/20 when she weighed 110 lb.    Labs reviewed; Ca: 8.4 mg/dl.  Medications reviewed; 1000 mcg IM vitamin B12/day, 1 tablet multivitamin with minerals/day, 1 packet metamucil TID.    NUTRITION - FOCUSED PHYSICAL EXAM:  Flowsheet Row Most Recent Value  Orbital Region Moderate depletion  Upper Arm Region Moderate depletion  Thoracic and Lumbar Region Unable to assess  Buccal Region Mild depletion  Temple Region Moderate depletion  Clavicle Bone Region Severe depletion  Clavicle and Acromion Bone Region Severe depletion  Scapular Bone Region Moderate depletion  Dorsal Hand Moderate depletion  Patellar Region Unable to assess  Anterior Thigh Region Unable to assess  Posterior Calf Region Unable to assess  Edema (RD Assessment) Unable to assess  Hair Reviewed  Eyes Reviewed  Mouth Reviewed  Skin Reviewed  Nails Reviewed       Diet Order:   Diet Order             Diet regular Room service appropriate? Yes; Fluid consistency: Thin  Diet effective now                   EDUCATION NEEDS:   Not appropriate for education at this time  Skin:  Skin Assessment: Skin Integrity Issues: Skin Integrity Issues:: Stage III Stage III: sacrum  Last BM:  9/13  Height:   Ht Readings from Last 1 Encounters:  09/09/21 '4\' 6"'$  (1.372 m)    Weight:   Wt Readings from Last 1 Encounters:  09/09/21 41.4 kg     Estimated Nutritional Needs:  Kcal:  1240-1425 kcal Protein:  55-70 grams Fluid:  >/= 1.6 L/day     Jarome Matin, MS, RD, LDN, CNSC Inpatient Clinical Dietitian RD pager # available in [facility]  After hours/weekend pager # available in [facility]

## 2021-09-11 DIAGNOSIS — K922 Gastrointestinal hemorrhage, unspecified: Secondary | ICD-10-CM | POA: Diagnosis not present

## 2021-09-11 LAB — TYPE AND SCREEN
ABO/RH(D): O POS
Antibody Screen: NEGATIVE
Unit division: 0
Unit division: 0
Unit division: 0
Unit division: 0

## 2021-09-11 LAB — BPAM RBC
Blood Product Expiration Date: 202210172359
Blood Product Expiration Date: 202210172359
Blood Product Expiration Date: 202210182359
Blood Product Expiration Date: 202210192359
ISSUE DATE / TIME: 202209131338
ISSUE DATE / TIME: 202209132159
ISSUE DATE / TIME: 202209140604
ISSUE DATE / TIME: 202209141139
Unit Type and Rh: 5100
Unit Type and Rh: 5100
Unit Type and Rh: 5100
Unit Type and Rh: 5100

## 2021-09-11 LAB — CBC WITH DIFFERENTIAL/PLATELET
Abs Immature Granulocytes: 0.07 10*3/uL (ref 0.00–0.07)
Basophils Absolute: 0 10*3/uL (ref 0.0–0.1)
Basophils Relative: 0 %
Eosinophils Absolute: 0.3 10*3/uL (ref 0.0–0.5)
Eosinophils Relative: 3 %
HCT: 34.9 % — ABNORMAL LOW (ref 36.0–46.0)
Hemoglobin: 10.7 g/dL — ABNORMAL LOW (ref 12.0–15.0)
Immature Granulocytes: 1 %
Lymphocytes Relative: 10 %
Lymphs Abs: 0.9 10*3/uL (ref 0.7–4.0)
MCH: 24.4 pg — ABNORMAL LOW (ref 26.0–34.0)
MCHC: 30.7 g/dL (ref 30.0–36.0)
MCV: 79.7 fL — ABNORMAL LOW (ref 80.0–100.0)
Monocytes Absolute: 0.6 10*3/uL (ref 0.1–1.0)
Monocytes Relative: 7 %
Neutro Abs: 7.3 10*3/uL (ref 1.7–7.7)
Neutrophils Relative %: 79 %
Platelets: 396 10*3/uL (ref 150–400)
RBC: 4.38 MIL/uL (ref 3.87–5.11)
RDW: 23.3 % — ABNORMAL HIGH (ref 11.5–15.5)
WBC: 9.2 10*3/uL (ref 4.0–10.5)
nRBC: 0.8 % — ABNORMAL HIGH (ref 0.0–0.2)

## 2021-09-11 LAB — BASIC METABOLIC PANEL
Anion gap: 8 (ref 5–15)
BUN: 14 mg/dL (ref 8–23)
CO2: 26 mmol/L (ref 22–32)
Calcium: 8.7 mg/dL — ABNORMAL LOW (ref 8.9–10.3)
Chloride: 103 mmol/L (ref 98–111)
Creatinine, Ser: 0.79 mg/dL (ref 0.44–1.00)
GFR, Estimated: >60 mL/min (ref >60)
Glucose, Bld: 95 mg/dL (ref 70–99)
Potassium: 3.9 mmol/L (ref 3.5–5.1)
Sodium: 137 mmol/L (ref 135–145)

## 2021-09-11 MED ORDER — PSYLLIUM 95 % PO PACK: 1.0000 | PACK | Freq: Three times a day (TID) | ORAL | Status: DC

## 2021-09-11 MED ORDER — CYANOCOBALAMIN 1000 MCG PO TABS: 1000.0000 ug | ORAL_TABLET | Freq: Every day | ORAL | Status: DC

## 2021-09-11 NOTE — Progress Notes (Signed)
D/C instructions reviewed w/ son, [name], by phone. [name] verbalizes understanding and all questions answered. Pt awaiting O2 portable tank delivery then will arrange for w/c van transportation home.

## 2021-09-11 NOTE — Plan of Care (Signed)
  Problem: Clinical Measurements: Goal: Diagnostic test results will improve Outcome: Progressing   Problem: Nutrition: Goal: Adequate nutrition will be maintained Outcome: Progressing   

## 2021-09-11 NOTE — Progress Notes (Signed)
[facility] Gastroenterology Progress Note  [name] 85 y.o. [birth year]  CC:  Rectal bleeding, anemia   Subjective: Patient resting in bed comfortably. Patient has received 3 units of packed red blood cells and is doing better She denies any nausea, vomiting, abdominal pain. Had a well formed bowel movement this morning without any blood. Patient's been eating food without any issues. She is complaining of a headache left temple, no vision changes, weakness. Patient also complaining of urinary frequency and some dysuria. Patient states she is very anxious about going home.  NM GI BLOOD LOSS IMPRESSION: 1. No evidence of active gastrointestinal bleeding.    ROS : Review of Systems  Constitutional:  Positive for weight loss (since stroke last year). Negative for chills and fever.  Respiratory:  Negative for shortness of breath.   Cardiovascular:  Negative for leg swelling.  Gastrointestinal:  Negative for abdominal pain, blood in stool (no further since hospital admission), constipation, diarrhea, heartburn, melena, nausea and vomiting.  Genitourinary:  Positive for dysuria and frequency.  Musculoskeletal:  Negative for falls.  Psychiatric/Behavioral:  The patient is nervous/anxious.      Objective: Vital signs in last 24 hours: Vitals:   09/10/21 2035 09/11/21 0414  BP: 129/64 (!) 138/55  Pulse: 76 78  Resp: 17 18  Temp: 98 F (36.7 C) 97.8 F (36.6 C)  SpO2: 98% 98%     Physical Exam: Physical Exam Constitutional:      Appearance: Normal appearance.     Comments: elderly  Abdominal:     Palpations: Abdomen is soft.     Comments: Normal bowel sounds, retractable umbilical hernia, non tender obese AB  Neurological:     General: No focal deficit present.     Mental Status: She is alert.     Lab Results: Recent Labs    09/10/21 0413 09/11/21 0444  NA 135 137  K 3.9 3.9  CL 99 103  CO2 26 26  GLUCOSE 76 95  BUN 14 14  CREATININE 0.64 0.79  CALCIUM  8.4* 8.7*  MG 2.0  --     No results for input(s): AST, ALT, ALKPHOS, BILITOT, PROT, ALBUMIN in the last 72 hours. Recent Labs    09/10/21 0413 09/10/21 1701 09/10/21 2249 09/11/21 0444  WBC 7.5  --   --  9.2  NEUTROABS 5.6  --   --  7.3  HGB 6.9*   < > 10.2* 10.7*  HCT 23.6*   < > 32.7* 34.9*  MCV 74.0*  --   --  79.7*  PLT 475*  --   --  396   < > = values in this interval not displayed.    No results for input(s): LABPROT, INR in the last 72 hours.  Lab Results: Results for orders placed or performed during the hospital encounter of 09/09/21 (from the past 48 hour(s))  POC occult blood, ED     Status: Abnormal   Collection Time: 09/09/21 11:33 AM  Result Value Ref Range   Fecal Occult Bld POSITIVE (A) NEGATIVE  CBC with Differential     Status: Abnormal   Collection Time: 09/09/21 11:51 AM  Result Value Ref Range   WBC 9.5 4.0 - 10.5 K/uL   RBC 2.26 (L) 3.87 - 5.11 MIL/uL   Hemoglobin 3.6 (LL) 12.0 - 15.0 g/dL    Comment: Reticulocyte Hemoglobin testing may be clinically indicated, consider ordering this additional test [id number] THIS CRITICAL RESULT HAS VERIFIED AND BEEN CALLED TO WEST,S. RN BY  NICOLE MCCOY ON 09 13 2022 AT [id number], AND HAS BEEN READ BACK. CRITICAL RESULT VERIFIED. RESULT CHECKED.    HCT 14.8 (L) 36.0 - 46.0 %   MCV 65.5 (L) 80.0 - 100.0 fL   MCH 15.9 (L) 26.0 - 34.0 pg   MCHC 24.3 (L) 30.0 - 36.0 g/dL   RDW 21.2 (H) 11.5 - 15.5 %   Platelets 821 (H) 150 - 400 K/uL   nRBC 0.9 (H) 0.0 - 0.2 %   Neutrophils Relative % 83 %   Neutro Abs 7.9 (H) 1.7 - 7.7 K/uL   Lymphocytes Relative 11 %   Lymphs Abs 1.0 0.7 - 4.0 K/uL   Monocytes Relative 5 %   Monocytes Absolute 0.5 0.1 - 1.0 K/uL   Eosinophils Relative 0 %   Eosinophils Absolute 0.0 0.0 - 0.5 K/uL   Basophils Relative 0 %   Basophils Absolute 0.0 0.0 - 0.1 K/uL   Immature Granulocytes 1 %   Abs Immature Granulocytes 0.09 (H) 0.00 - 0.07 K/uL   Tear Drop Cells PRESENT    Polychromasia PRESENT     Target Cells PRESENT    Ovalocytes PRESENT     Comment: Performed at [facility], [place] [address] [place], [place] [id number]  Basic metabolic panel     Status: Abnormal   Collection Time: 09/09/21 11:51 AM  Result Value Ref Range   Sodium 137 135 - 145 mmol/L   Potassium 4.2 3.5 - 5.1 mmol/L   Chloride 99 98 - 111 mmol/L   CO2 26 22 - 32 mmol/L   Glucose, Bld 93 70 - 99 mg/dL    Comment: Glucose reference range applies only to samples taken after fasting for at least 8 hours.   BUN 22 8 - 23 mg/dL   Creatinine, Ser 0.75 0.44 - 1.00 mg/dL   Calcium 8.3 (L) 8.9 - 10.3 mg/dL   GFR, Estimated >60 >60 mL/min    Comment: (NOTE) Calculated using the CKD-EPI Creatinine Equation (2021)    Anion gap 12 5 - 15    Comment: Performed at [facility], [place] [address], [place], [place] [address]  Type and screen     Status: None (Preliminary result)   Collection Time: 09/09/21 11:51 AM  Result Value Ref Range   ABO/RH(D) O POS    Antibody Screen NEG    Sample Expiration 09/12/2021,2359    Unit Number [id number]    Blood Component Type RED CELLS,LR    Unit division 00    Status of Unit ISSUED,FINAL    Transfusion Status OK TO TRANSFUSE    Crossmatch Result Compatible    Unit Number [id number]    Blood Component Type RBC LR PHER1    Unit division 00    Status of Unit ISSUED,FINAL    Transfusion Status OK TO TRANSFUSE    Crossmatch Result Compatible    Unit Number [id number]    Blood Component Type RED CELLS,LR    Unit division 00    Status of Unit ISSUED    Transfusion Status OK TO TRANSFUSE    Crossmatch Result Compatible    Unit Number [id number]    Blood Component Type RED CELLS,LR    Unit division 00    Status of Unit ISSUED    Transfusion Status OK TO TRANSFUSE    Crossmatch Result      Compatible Performed at [place] [address], [place],  [address]   Prepare RBC (crossmatch)  Status: None   Collection Time: 09/09/21 12:41 PM  Result Value Ref Range   Order Confirmation      ORDER PROCESSED BY BLOOD BANK Performed at [facility], [place] [address] [place], [place] [address]   Resp Panel by RT-PCR (Flu A&B, Covid) Nasopharyngeal Swab     Status: None   Collection Time: 09/09/21  2:07 PM   Specimen: Nasopharyngeal Swab; Nasopharyngeal(NP) swabs in vial transport medium  Result Value Ref Range   SARS Coronavirus 2 by RT PCR NEGATIVE NEGATIVE    Comment: (NOTE) SARS-CoV-2 target nucleic acids are NOT DETECTED.  The SARS-CoV-2 RNA is generally detectable in upper respiratory specimens during the acute phase of infection. The lowest concentration of SARS-CoV-2 viral copies this assay can detect is 138 copies/mL. A negative result does not preclude SARS-Cov-2 infection and should not be used as the sole basis for treatment or other patient management decisions. A negative result may occur with  improper specimen collection/handling, submission of specimen other than nasopharyngeal swab, presence of viral mutation(s) within the areas targeted by this assay, and inadequate number of viral copies(<138 copies/mL). A negative result must be combined with clinical observations, patient history, and epidemiological information. The expected result is Negative.  Fact Sheet for Patients:  EntrepreneurPulse.com.au  Fact Sheet for Healthcare Providers:  IncredibleEmployment.be  This test is no t yet approved or cleared by the [place] FDA and  has been authorized for detection and/or diagnosis of SARS-CoV-2 by FDA under an Emergency Use Authorization (EUA). This EUA will remain  in effect (meaning this test can be used) for the duration of the COVID-19 declaration under Section 564(b)(1) of the Act, 21 U.S.C.section 360bbb-3(b)(1), unless the authorization is terminated  or revoked sooner.       Influenza  A by PCR NEGATIVE NEGATIVE   Influenza B by PCR NEGATIVE NEGATIVE    Comment: (NOTE) The Xpert Xpress SARS-CoV-2/FLU/RSV plus assay is intended as an aid in the diagnosis of influenza from Nasopharyngeal swab specimens and should not be used as a sole basis for treatment. Nasal washings and aspirates are unacceptable for Xpert Xpress SARS-CoV-2/FLU/RSV testing.  Fact Sheet for Patients: EntrepreneurPulse.com.au  Fact Sheet for Healthcare Providers: IncredibleEmployment.be  This test is not yet approved or cleared by the [place] FDA and has been authorized for detection and/or diagnosis of SARS-CoV-2 by FDA under an Emergency Use Authorization (EUA). This EUA will remain in effect (meaning this test can be used) for the duration of the COVID-19 declaration under Section 564(b)(1) of the Act, 21 U.S.C. section 360bbb-3(b)(1), unless the authorization is terminated or revoked.  Performed at [facility], [place] [address], [place], [place] [address]   Ferritin     Status: Abnormal   Collection Time: 09/10/21  4:13 AM  Result Value Ref Range   Ferritin 6 (L) 11 - 307 ng/mL    Comment: Performed at [facility], [place] [address], [place], [place] [address]  Folate     Status: None   Collection Time: 09/10/21  4:13 AM  Result Value Ref Range   Folate 8.0 >5.9 ng/mL    Comment: Performed at [facility], [place] [address] [place], [place] [address]  Iron and TIBC     Status: Abnormal   Collection Time: 09/10/21  4:13 AM  Result Value Ref Range   Iron 15 (L) 28 - 170 ug/dL   TIBC 363 250 - 450 ug/dL   Saturation Ratios 4 (  L) 10.4 - 31.8 %   UIBC 348 ug/dL    Comment: Performed at [facility], [place] [address] [place], [place] [address]  Vitamin B12     Status: None   Collection Time: 09/10/21  4:13 AM  Result Value Ref Range   Vitamin B-12 230 180 - 914 pg/mL     Comment: (NOTE) This assay is not validated for testing neonatal or myeloproliferative syndrome specimens for Vitamin B12 levels. Performed at [facility], [place] [address], [place], [place] [id number]   Basic metabolic panel     Status: Abnormal   Collection Time: 09/10/21  4:13 AM  Result Value Ref Range   Sodium 135 135 - 145 mmol/L   Potassium 3.9 3.5 - 5.1 mmol/L   Chloride 99 98 - 111 mmol/L   CO2 26 22 - 32 mmol/L   Glucose, Bld 76 70 - 99 mg/dL    Comment: Glucose reference range applies only to samples taken after fasting for at least 8 hours.   BUN 14 8 - 23 mg/dL   Creatinine, Ser 0.64 0.44 - 1.00 mg/dL   Calcium 8.4 (L) 8.9 - 10.3 mg/dL   GFR, Estimated >60 >60 mL/min    Comment: (NOTE) Calculated using the CKD-EPI Creatinine Equation (2021)    Anion gap 10 5 - 15    Comment: Performed at [facility], [place] [address] [place], [place] [address]  CBC with Differential/Platelet     Status: Abnormal   Collection Time: 09/10/21  4:13 AM  Result Value Ref Range   WBC 7.5 4.0 - 10.5 K/uL   RBC 3.19 (L) 3.87 - 5.11 MIL/uL   Hemoglobin 6.9 (LL) 12.0 - 15.0 g/dL    Comment: REPEATED TO VERIFY POST TRANSFUSION SPECIMEN Reticulocyte Hemoglobin testing may be clinically indicated, consider ordering this additional test [id number] THIS CRITICAL RESULT HAS VERIFIED AND BEEN CALLED TO RN M ROBERTS BY ALEXIS Emerado ON 09 14 2022 AT 0457, AND HAS BEEN READ BACK.     HCT 23.6 (L) 36.0 - 46.0 %   MCV 74.0 (L) 80.0 - 100.0 fL    Comment: POST TRANSFUSION SPECIMEN REPEATED TO VERIFY DELTA CHECK NOTED    MCH 21.6 (L) 26.0 - 34.0 pg   MCHC 29.2 (L) 30.0 - 36.0 g/dL   RDW 24.3 (H) 11.5 - 15.5 %   Platelets 475 (H) 150 - 400 K/uL   nRBC 0.8 (H) 0.0 - 0.2 %   Neutrophils Relative % 74 %   Neutro Abs 5.6 1.7 - 7.7 K/uL   Lymphocytes Relative 15 %   Lymphs Abs 1.1 0.7 - 4.0 K/uL   Monocytes Relative 8 %   Monocytes Absolute 0.6 0.1 - 1.0  K/uL   Eosinophils Relative 1 %   Eosinophils Absolute 0.1 0.0 - 0.5 K/uL   Basophils Relative 1 %   Basophils Absolute 0.1 0.0 - 0.1 K/uL   Immature Granulocytes 1 %   Abs Immature Granulocytes 0.08 (H) 0.00 - 0.07 K/uL   Tear Drop Cells PRESENT    Polychromasia PRESENT    Target Cells PRESENT     Comment: Performed at [facility], [place] [address] [place], [place] [address]  Magnesium     Status: None   Collection Time: 09/10/21  4:13 AM  Result Value Ref Range   Magnesium 2.0 1.7 - 2.4 mg/dL    Comment: Performed at [facility], [place] [address], [place], [place] [address]  Prepare RBC (crossmatch)     Status: None   Collection Time: 09/10/21  5:27 AM  Result Value Ref Range   Order Confirmation      ORDER PROCESSED BY BLOOD BANK Performed at [place] [address] [place], [place] [address]   Prepare RBC (crossmatch)     Status: None   Collection Time: 09/10/21 10:38 AM  Result Value Ref Range   Order Confirmation      ORDER PROCESSED BY BLOOD BANK Performed at [facility], [place] [address] [place], [place] [address]   Hemoglobin and hematocrit, blood     Status: Abnormal   Collection Time: 09/10/21  5:01 PM  Result Value Ref Range   Hemoglobin 11.1 (L) 12.0 - 15.0 g/dL    Comment: REPEATED TO VERIFY POST TRANSFUSION SPECIMEN    HCT 36.0 36.0 - 46.0 %    Comment: Performed at [facility], [place] [address], [place], [place] [address]  Hemoglobin and hematocrit, blood     Status: Abnormal   Collection Time: 09/10/21 10:49 PM  Result Value Ref Range   Hemoglobin 10.2 (L) 12.0 - 15.0 g/dL   HCT 32.7 (L) 36.0 - 46.0 %    Comment: Performed at [facility], [place] [address], [place], [place] [id number]  Basic metabolic panel     Status: Abnormal   Collection Time: 09/11/21  4:44 AM  Result Value Ref Range   Sodium 137 135 - 145 mmol/L   Potassium 3.9 3.5 -  5.1 mmol/L   Chloride 103 98 - 111 mmol/L   CO2 26 22 - 32 mmol/L   Glucose, Bld 95 70 - 99 mg/dL    Comment: Glucose reference range applies only to samples taken after fasting for at least 8 hours.   BUN 14 8 - 23 mg/dL   Creatinine, Ser 0.79 0.44 - 1.00 mg/dL   Calcium 8.7 (L) 8.9 - 10.3 mg/dL   GFR, Estimated >60 >60 mL/min    Comment: (NOTE) Calculated using the CKD-EPI Creatinine Equation (2021)    Anion gap 8 5 - 15    Comment: Performed at [facility], [place] [address], [place],  [address]  CBC with Differential/Platelet     Status: Abnormal   Collection Time: 09/11/21  4:44 AM  Result Value Ref Range   WBC 9.2 4.0 - 10.5 K/uL   RBC 4.38 3.87 - 5.11 MIL/uL   Hemoglobin 10.7 (L) 12.0 - 15.0 g/dL   HCT 34.9 (L) 36.0 - 46.0 %   MCV 79.7 (L) 80.0 - 100.0 fL    Comment: POST TRANSFUSION SPECIMEN REPEATED TO VERIFY DELTA CHECK NOTED    MCH 24.4 (L) 26.0 - 34.0 pg   MCHC 30.7 30.0 - 36.0 g/dL   RDW 23.3 (H) 11.5 - 15.5 %   Platelets 396 150 - 400 K/uL   nRBC 0.8 (H) 0.0 - 0.2 %   Neutrophils Relative % 79 %   Neutro Abs 7.3 1.7 - 7.7 K/uL   Lymphocytes Relative 10 %   Lymphs Abs 0.9 0.7 - 4.0 K/uL   Monocytes Relative 7 %   Monocytes Absolute 0.6 0.1 - 1.0 K/uL   Eosinophils Relative 3 %   Eosinophils Absolute 0.3 0.0 - 0.5 K/uL   Basophils Relative 0 %   Basophils Absolute 0.0 0.0 - 0.1 K/uL   Immature Granulocytes 1 %   Abs Immature Granulocytes 0.07 0.00 - 0.07 K/uL   Polychromasia  PRESENT    Target Cells PRESENT    Ovalocytes PRESENT     Comment: Performed at [facility], [place] [address] [place], [place] [address]    Assessment/Plan:  Painless hematochezia with severe anemia History of diverticulosis and prior GI bleed NM GI blood loss scan negative for active bleeding.  Hemoglobin 6.9 to  10.7 and stable after 3 PRBCs Continue Metamucil TID for constipation.  Colonoscopy from 03/20/2020: Diverticulosis in  sigmoid and descending, 1 tubular adenoma removed from transverse colon EGD from 04/2019: Focal erosive esophagitis?  Pill induced, Schatzki's rings  Likely diverticular bleed that has resolved, continue metamucil outpatient, from a GI perspective patient is doing well.  [facility] will sign off. Please contact us if we can be of any further assistance during this hospital stay.   Vladimir Crofts PA-C 09/11/2021, 8:02 AM  Contact #  [phone]

## 2021-09-11 NOTE — Consult Note (Addendum)
[facility] Nurse Consult Note: Reason for Consult: Consult requested for buttocks.  Pt states she previously had a pressure injury which healed, "then I got another wound." Wound type: Right buttock with red moist full thickness abrasion; location and appearance are not consistent with pressure, but are most likely a result of shear. 3.5X2.5X.2cm, small amt yellow drainage.  Dressing procedure/placement/frequency: Topical treatment orders provided for bedside nurses to perform to protect from further injury as follows: Foam dressing to right buttocks, change Q 3 days or PRN soiling.  Please re-consult if further assistance is needed.  Thank-you,  Julien Girt MSN, [place], [place], [place], [place]

## 2021-09-11 NOTE — TOC Transition Note (Addendum)
Transition of Care [facility]) - CM/SW Discharge Note   Patient Details  Name: EULA KELSALL MRN: [id number] Date of Birth: October 10, 1936  Transition of Care [facility]) CM/SW Contact:  Dessa Phi, RN Phone Number: 09/11/2021, 12:10 PM   Clinical Narrative: Damaris Schooner to son [name] on phone about d/c plans-d/c home;already active w/[facility] home 02-can w/c transport home-patient can pivot from w/c-confirmed address, & [name] will be home;[facility] to deliver home 02 travel tank to rm prior d/c. Efficient use of transport instead of PTAR d/t patient w/c transport.Will use [facility] w/c transport-nsg to manage. No further CM needs. 1:42p-Scheduled for PTAR-PTAR called.[facility] transport unable to provide w/c for w/c transport. Contacted Zach w/[facility] to cancel travel tank to deliver to rm. No further CM needs.    Final next level of care: Home/Self Care Barriers to Discharge: No Barriers Identified   Patient Goals and CMS Choice Patient states their goals for this hospitalization and ongoing recovery are:: go home CMS Medicare.gov Compare Post Acute Care list provided to:: Patient Represenative (must comment) Choice offered to / list presented to : Adult Children  Discharge Placement                       Discharge Plan and Services   Discharge Planning Services: CM Consult            DME Arranged: Oxygen DME Agency: [facility]                  Social Determinants of Health (SDOH) Interventions     Readmission Risk Interventions Readmission Risk Prevention Plan 06/20/2019 05/01/2019  Transportation Screening Complete Complete  PCP or Specialist Appt within 5-7 Days Complete -  Home Care Screening Complete -  Medication Review (RN CM) Complete -  Medication Review (RN Transport planner) - Referral to Pharmacy  Some recent data might be hidden

## 2021-09-11 NOTE — Discharge Summary (Signed)
Physician Discharge Summary   [name] [id number] DOB: [birth year] DOA: 09/09/2021  PCP: [name], MD  Admit date: 09/09/2021 Discharge date:  09/11/2021  Admitted From: home Disposition:  home Discharging physician: Dwyane Dee, MD  Recommendations for Outpatient Follow-up:  Consider referral to hematology for persistent thrombocytosis   Home Health:  Equipment/Devices:   Patient discharged to home in Discharge Condition: stable Risk of unplanned readmission score: Unplanned Admission- Pilot do not use: 11.09  CODE STATUS: Full Diet recommendation:  Diet Orders (From admission, onward)     Start     Ordered   09/11/21 0000  Diet general        09/11/21 1134   09/10/21 1033  Diet regular Room service appropriate? Yes; Fluid consistency: Thin  Diet effective now       Question Answer Comment  Room service appropriate? Yes   Fluid consistency: Thin      09/10/21 1033            Hospital Course: [name] is an 85 yo female with PMH COPD (on 2-3 L O2 at home), HTN, chronic back pain, chronic thrombocytosis who presented to the hospital with complaints of bright red blood per rectum. She was hospitalized last year, May 2021 for similar.  She underwent colonoscopy at that time and was found to have diverticulosis, internal hemorrhoids, and a polyp which was removed.  On work-up in the ER, hemoglobin was found to be 3.6 g/dL.  Previous values show baseline around 8 g/dL. She was hemodynamically stable, BP 124/59.  Temp 98.2, heart rate 111, respirations 16, 100% on 3 L nasal cannula. She was given 1 L normal saline bolus and 2 units of blood were ordered.  Case was discussed with GI and she was recommended to undergo nuc med scan. Scan was negative for active bleed.  She received total of 4 units of PRBC and IV iron load.  Hemoglobin remained stable after blood transfusions, 10.7 g/dL at time of discharge.  She had no further rectal bleeding and  remained hemodynamically stable.  * GIB (gastrointestinal bleeding) - likely slow/chronic with some rapid bleeding prior to admission; differential includes diverticular bleed (painless per patient) vs hemorrhoids; less likely to be upper given stable hemodynamics and asymptomatic in nature -Nuc med scan negative for active bleed.  Suspicion still likely was diverticular in etiology - now is s/p 4 units PRBC total for admission  -Hemoglobin 10.7 g/dL at time of discharge.  No further bleeding  Microcytic anemia - suspected iron loss from chronic blood loss - check iron studies: stores low, giving Infed on 9/14 - folate = 8, B12 = 230 (start replacement)  Thrombocytosis - chronically elevated 500-600 k  -Underwent some work-up last hospitalization.  JAK2 genotype normal.  Patient was recommended to have referral to hematology but no prior notes since last discharge -Other consideration was that it was related to underlying blood loss and iron deficiency anemia  Hypertension - remained hemodynamically stable     The patient's chronic medical conditions were treated accordingly per the patient's home medication regimen except as noted.  On day of discharge, patient was felt deemed stable for discharge. Patient/family member advised to call PCP or come back to ER if needed.   Principal Diagnosis: GIB (gastrointestinal bleeding)  Discharge Diagnoses: Active Hospital Problems   Diagnosis Date Noted   GIB (gastrointestinal bleeding) 04/15/2020    Priority: High   Malnutrition of moderate degree 09/10/2021   Microcytic anemia 09/09/2021  Thrombocytosis 03/16/2020   Hypertension 03/10/2014    Resolved Hospital Problems  No resolved problems to display.    Discharge Instructions     Diet general   Complete by: As directed    Discharge wound care:   Complete by: As directed    Off load weight at home from right buttocks as able   Increase activity slowly   Complete by: As  directed       Allergies as of 09/11/2021       Reactions   Ambien [zolpidem] Other (See Comments)   Hallucinations and paranoid   Codeine Hives, Nausea And Vomiting        Medication List     TAKE these medications    Advil PM 200-38 MG Tabs Generic drug: Ibuprofen-diphenhydrAMINE Cit Take 1 tablet by mouth as needed (for sleep).   albuterol 108 (90 Base) MCG/ACT inhaler Commonly known as: VENTOLIN HFA Inhale 2 puffs into the lungs every 4 (four) hours as needed for wheezing, cough or shortness of breath.   aspirin EC 81 MG tablet Take 81 mg by mouth daily. Swallow whole.   cyanocobalamin 1000 MCG tablet Take 1 tablet (1,000 mcg total) by mouth daily. Start taking on: September 13, 2021   Muscle Rub 10-15 % Crea Apply 1 application topically 2 (two) times daily as needed for muscle pain.   psyllium 95 % Pack Commonly known as: HYDROCIL/METAMUCIL Take 1 packet by mouth 3 (three) times daily.               Durable Medical Equipment  (From admission, onward)           Start     Ordered   09/11/21 1219  For home use only DME oxygen  Once       Question Answer Comment  Length of Need Lifetime   Mode or (Route) Nasal cannula   Liters per Minute 3   Frequency Continuous (stationary and portable oxygen unit needed)   Oxygen conserving device Yes   Oxygen delivery system Gas      09/11/21 1219              Discharge Care Instructions  (From admission, onward)           Start     Ordered   09/11/21 0000  Discharge wound care:       Comments: Off load weight at home from right buttocks as able   09/11/21 1134            Allergies  Allergen Reactions   Ambien [Zolpidem] Other (See Comments)    Hallucinations and paranoid   Codeine Hives and Nausea And Vomiting    Consultations: GI  Discharge Exam: BP (!) 143/54 (BP Location: Right Arm)   Pulse 81   Temp 97.7 F (36.5 C) (Oral)   Resp 14   Ht '4\' 6"'$  (1.372 m)   Wt 41.4  kg   SpO2 99%   BMI 21.99 kg/m  General appearance: alert, cooperative, and no distress Head: Normocephalic, without obvious abnormality, atraumatic Eyes:  EOMI Lungs: clear to auscultation bilaterally Heart: regular rate and rhythm and S1, S2 normal Abdomen: normal findings: bowel sounds normal and soft, non-tender Extremities:  thin, no edema Skin: mobility and turgor normal Neurologic: Grossly normal  The results of significant diagnostics from this hospitalization (including imaging, microbiology, ancillary and laboratory) are listed below for reference.   Microbiology: Recent Results (from the past 240 hour(s))  Resp Panel by RT-PCR (  Flu A&B, Covid) Nasopharyngeal Swab     Status: None   Collection Time: 09/09/21  2:07 PM   Specimen: Nasopharyngeal Swab; Nasopharyngeal(NP) swabs in vial transport medium  Result Value Ref Range Status   SARS Coronavirus 2 by RT PCR NEGATIVE NEGATIVE Final    Comment: (NOTE) SARS-CoV-2 target nucleic acids are NOT DETECTED.  The SARS-CoV-2 RNA is generally detectable in upper respiratory specimens during the acute phase of infection. The lowest concentration of SARS-CoV-2 viral copies this assay can detect is 138 copies/mL. A negative result does not preclude SARS-Cov-2 infection and should not be used as the sole basis for treatment or other patient management decisions. A negative result may occur with  improper specimen collection/handling, submission of specimen other than nasopharyngeal swab, presence of viral mutation(s) within the areas targeted by this assay, and inadequate number of viral copies(<138 copies/mL). A negative result must be combined with clinical observations, patient history, and epidemiological information. The expected result is Negative.  Fact Sheet for Patients:  EntrepreneurPulse.com.au  Fact Sheet for Healthcare Providers:  IncredibleEmployment.be  This test is no t yet  approved or cleared by the [place] FDA and  has been authorized for detection and/or diagnosis of SARS-CoV-2 by FDA under an Emergency Use Authorization (EUA). This EUA will remain  in effect (meaning this test can be used) for the duration of the COVID-19 declaration under Section 564(b)(1) of the Act, 21 U.S.C.section 360bbb-3(b)(1), unless the authorization is terminated  or revoked sooner.       Influenza A by PCR NEGATIVE NEGATIVE Final   Influenza B by PCR NEGATIVE NEGATIVE Final    Comment: (NOTE) The Xpert Xpress SARS-CoV-2/FLU/RSV plus assay is intended as an aid in the diagnosis of influenza from Nasopharyngeal swab specimens and should not be used as a sole basis for treatment. Nasal washings and aspirates are unacceptable for Xpert Xpress SARS-CoV-2/FLU/RSV testing.  Fact Sheet for Patients: EntrepreneurPulse.com.au  Fact Sheet for Healthcare Providers: IncredibleEmployment.be  This test is not yet approved or cleared by the [place] FDA and has been authorized for detection and/or diagnosis of SARS-CoV-2 by FDA under an Emergency Use Authorization (EUA). This EUA will remain in effect (meaning this test can be used) for the duration of the COVID-19 declaration under Section 564(b)(1) of the Act, 21 U.S.C. section 360bbb-3(b)(1), unless the authorization is terminated or revoked.  Performed at [facility], [place] [address] [place], [place] [address]      Labs: BNP (last 3 results) No results for input(s): BNP in the last 8760 hours. Basic Metabolic Panel: Recent Labs  Lab 09/09/21 1151 09/10/21 0413 09/11/21 0444  NA 137 135 137  K 4.2 3.9 3.9  CL 99 99 103  CO2 '26 26 26  '$ GLUCOSE 93 76 95  BUN '22 14 14  '$ CREATININE 0.75 0.64 0.79  CALCIUM 8.3* 8.4* 8.7*  MG  --  2.0  --    Liver Function Tests: No results for input(s): AST, ALT, ALKPHOS, BILITOT, PROT, ALBUMIN in the last 168  hours. No results for input(s): LIPASE, AMYLASE in the last 168 hours. No results for input(s): AMMONIA in the last 168 hours. CBC: Recent Labs  Lab 09/09/21 1151 09/10/21 0413 09/10/21 1701 09/10/21 2249 09/11/21 0444  WBC 9.5 7.5  --   --  9.2  NEUTROABS 7.9* 5.6  --   --  7.3  HGB 3.6* 6.9* 11.1* 10.2* 10.7*  HCT 14.8* 23.6* 36.0 32.7* 34.9*  MCV 65.5* 74.0*  --   --  79.7*  PLT 821* 475*  --   --  396   Cardiac Enzymes: No results for input(s): CKTOTAL, CKMB, CKMBINDEX, TROPONINI in the last 168 hours. BNP: Invalid input(s): POCBNP CBG: No results for input(s): GLUCAP in the last 168 hours. D-Dimer No results for input(s): DDIMER in the last 72 hours. Hgb A1c No results for input(s): HGBA1C in the last 72 hours. Lipid Profile No results for input(s): CHOL, HDL, LDLCALC, TRIG, CHOLHDL, LDLDIRECT in the last 72 hours. Thyroid function studies No results for input(s): TSH, T4TOTAL, T3FREE, THYROIDAB in the last 72 hours.  Invalid input(s): FREET3 Anemia work up Recent Labs    09/10/21 0413  VITAMINB12 230  FOLATE 8.0  FERRITIN 6*  TIBC 363  IRON 15*   Urinalysis    Component Value Date/Time   COLORURINE YELLOW 04/16/2020 1930   APPEARANCEUR CLOUDY (A) 04/16/2020 1930   LABSPEC 1.018 04/16/2020 1930   PHURINE 7.0 04/16/2020 1930   GLUCOSEU NEGATIVE 04/16/2020 1930   HGBUR MODERATE (A) 04/16/2020 1930   BILIRUBINUR NEGATIVE 04/16/2020 1930   KETONESUR NEGATIVE 04/16/2020 1930   PROTEINUR 30 (A) 04/16/2020 1930   UROBILINOGEN 0.2 03/19/2014 1657   NITRITE POSITIVE (A) 04/16/2020 1930   LEUKOCYTESUR LARGE (A) 04/16/2020 1930   Sepsis Labs Invalid input(s): PROCALCITONIN,  WBC,  LACTICIDVEN Microbiology Recent Results (from the past 240 hour(s))  Resp Panel by RT-PCR (Flu A&B, Covid) Nasopharyngeal Swab     Status: None   Collection Time: 09/09/21  2:07 PM   Specimen: Nasopharyngeal Swab; Nasopharyngeal(NP) swabs in vial transport medium  Result Value  Ref Range Status   SARS Coronavirus 2 by RT PCR NEGATIVE NEGATIVE Final    Comment: (NOTE) SARS-CoV-2 target nucleic acids are NOT DETECTED.  The SARS-CoV-2 RNA is generally detectable in upper respiratory specimens during the acute phase of infection. The lowest concentration of SARS-CoV-2 viral copies this assay can detect is 138 copies/mL. A negative result does not preclude SARS-Cov-2 infection and should not be used as the sole basis for treatment or other patient management decisions. A negative result may occur with  improper specimen collection/handling, submission of specimen other than nasopharyngeal swab, presence of viral mutation(s) within the areas targeted by this assay, and inadequate number of viral copies(<138 copies/mL). A negative result must be combined with clinical observations, patient history, and epidemiological information. The expected result is Negative.  Fact Sheet for Patients:  EntrepreneurPulse.com.au  Fact Sheet for Healthcare Providers:  IncredibleEmployment.be  This test is no t yet approved or cleared by the [place] FDA and  has been authorized for detection and/or diagnosis of SARS-CoV-2 by FDA under an Emergency Use Authorization (EUA). This EUA will remain  in effect (meaning this test can be used) for the duration of the COVID-19 declaration under Section 564(b)(1) of the Act, 21 U.S.C.section 360bbb-3(b)(1), unless the authorization is terminated  or revoked sooner.       Influenza A by PCR NEGATIVE NEGATIVE Final   Influenza B by PCR NEGATIVE NEGATIVE Final    Comment: (NOTE) The Xpert Xpress SARS-CoV-2/FLU/RSV plus assay is intended as an aid in the diagnosis of influenza from Nasopharyngeal swab specimens and should not be used as a sole basis for treatment. Nasal washings and aspirates are unacceptable for Xpert Xpress SARS-CoV-2/FLU/RSV testing.  Fact Sheet for  Patients: EntrepreneurPulse.com.au  Fact Sheet for Healthcare Providers: IncredibleEmployment.be  This test is not yet approved or cleared by the [place] FDA and has been authorized for detection and/or diagnosis  of SARS-CoV-2 by FDA under an Emergency Use Authorization (EUA). This EUA will remain in effect (meaning this test can be used) for the duration of the COVID-19 declaration under Section 564(b)(1) of the Act, 21 U.S.C. section 360bbb-3(b)(1), unless the authorization is terminated or revoked.  Performed at [facility], [place] [address] [place], [place] [address]     Procedures/Studies: NM GI Blood Loss  Result Date: 09/09/2021 CLINICAL DATA:  Bloody stool for 3 days EXAM: NUCLEAR MEDICINE GASTROINTESTINAL BLEEDING SCAN TECHNIQUE: Sequential abdominal images were obtained following intravenous administration of Tc-28mlabeled red blood cells. RADIOPHARMACEUTICALS:  24.1 mCi Tc-955mertechnetate in-vitro labeled red cells. COMPARISON:  03/16/2020 FINDINGS: There is normal physiologic distribution of radiotracer. There is no evidence of radiotracer accumulation within the abdomen or pelvis to suggest active gastrointestinal bleeding. IMPRESSION: 1. No evidence of active gastrointestinal bleeding. Electronically Signed   By: MiRanda Ngo.D.   On: 09/09/2021 18:30     Time coordinating discharge: Over 30 minutes    DaDwyane DeeMD  [facility] 09/11/2021, 2:20 PM

## 2021-09-11 NOTE — Evaluation (Signed)
Physical Therapy Evaluation Patient Details Name: [name] MRN: [id number] DOB: 1935-12-31 Today's Date: 09/11/2021       Clinical Impression  Pt is an 85 y.o. female with below HPI. Pt requires assist for mobility and some ADLs and lives with her son and friend, available 24hrs who provide assist at baseline.P t required MOD A for performance and safety with bed mobility and transfers and will benefit from continued skilled PT in order to address listed impairments and maximize functional mobility. Recommend home with 24/7 supervision, assist for all mobility, and HHPT.      Recommendations for follow up therapy are one component of a multi-disciplinary discharge planning process, led by the attending physician.  Recommendations may be updated based on patient status, additional functional criteria and insurance authorization.    09/11/21 0900  PT Visit Information  Last PT Received On 09/11/21  Assistance Needed +1  History of Present Illness pt is an 85 yo female who presented to the hospital with complaints of bright red blood per rectum admitted for GI bleed. PMH significant for COPD (on 2-3 L O2 at home), HTN, chronic back pain, chronic thrombocytosis, stroke  Precautions  Precautions None  Precaution Comments Pt reports history of stroke ~73month, residual L hemiparesis  Restrictions  Weight Bearing Restrictions No  Home Living  Family/patient expects to be discharged to: Private residence  Living Arrangements Children (son and one of her friends)  Available Help at Discharge Family;Friend(s)  [place]  Entrance Stairs-Number of Steps 3  Entrance Stairs-Rails None  Home Layout One level  [place];Wheelchair - m[employer]- 2 wheels;Walker - 4 wheels;BSC;Grab bars - tub/shower;Hospital bed  Additional Comments Pt states 2 people carry her down steps when she leaves home. Son is there 24/7 and has been assisting  and she has a friend who also moved in recently who assists at home.  Prior Function  Level of Independence Needs assistance  Gait / Transfers Assistance Needed Pt reports she is able to use RW to transfer to w/c or BSC. Pt stated that she is able to take steps sometimes but has recently been in bed mostly since she has not been feeling well.  ADL's / Homemaking Assistance Needed assist from son with bathing when needed, reports she can dress herself mostly  Comments son and her friend, assist with bathing . able to get own clothes on. Primarily use BSC for toileting.  Communication  Communication No difficulties  Pain Assessment  Pain Assessment No/denies pain  Cognition  Arousal/Alertness Awake/alert  Behavior During Therapy WFL for tasks assessed/performed  Overall Cognitive Status Within Functional Limits for tasks assessed  Upper Extremity Assessment  Upper Extremity Assessment LUE deficits/detail;RUE deficits/detail  RUE Deficits / Details grossly 3+/5 strength, able to use for performance of functional tasks  LUE Deficits / Details Pt with fair grip strength, grossly 3/5 strength. fingers with flexion contracture  Lower Extremity Assessment  Lower Extremity Assessment RLE deficits/detail;LLE deficits/detail  RLE Deficits / Details Grossly 3+/5 throughout  RLE Sensation WNL  LLE Deficits / Details grossly 3-/5 throughout, difficulty with perfomance of knee flexion in supine due to reported knee OA.  LLE Sensation WNL  Cervical / Trunk Assessment  Cervical / Trunk Assessment Kyphotic  Bed Mobility  Overal bed mobility Needs Assistance  Bed Mobility Supine to Sit  Supine to sit Mod assist;HOB elevated  General bed mobility comments MOD A with HOB elevated, pt reports  she rented hospital bed following stroke and is able to use UE to assist with bringing herself to EOB at home. MOD A required for progression of B LEs to EOB, trunk to upright, and for scooting hip to EOB in prep for  sit to stand transfer. MIN A-CGA for seated balance, cues for use of UEs to maintain midline balance, pt initially with heavy Rt lateral lean in sitting.  Transfers  Overall transfer level Needs assistance  Equipment used Rolling walker (2 wheeled)  Transfers Sit to/from Stand  Sit to Stand Mod assist  General transfer comment MOD A for power up to rise with cues for safe hand placement with use of RW, cues provided for upright posture in standing. Pt able to take x3 side steps to recliner with MOD A for stability. Pt stating "i can't go home like this, i'm so weak" following transfer to recliner. Pt verbalizing fear of falling due to weakness and decreased balance/stability.  Balance  Overall balance assessment Needs assistance  Sitting-balance support Feet supported;Bilateral upper extremity supported  Sitting balance-Leahy Scale Poor  Postural control Right lateral lean  Standing balance support Bilateral upper extremity supported  Standing balance-Leahy Scale Poor  Standing balance comment reliant on external support  General Comments  General comments (skin integrity, edema, etc.) pt on 2.5L [place], reports O2 use at baseline  PT - End of Session  Equipment Utilized During Treatment Gait belt  Activity Tolerance Patient tolerated treatment well;Patient limited by fatigue  Patient left in chair;with call bell/phone within reach;with chair alarm set  Nurse Communication Mobility status (use of +2 for safety to assist pt back to bed)  PT Assessment  PT Recommendation/Assessment Patient needs continued PT services  PT Visit Diagnosis Muscle weakness (generalized) (M62.81);Other abnormalities of gait and mobility (R26.89)  PT Problem List Decreased strength;Decreased range of motion;Decreased activity tolerance;Decreased balance;Decreased mobility;Decreased knowledge of use of DME  PT Plan  PT Frequency (ACUTE ONLY) Min 3X/week  PT Treatment/Interventions (ACUTE ONLY) DME instruction;Gait  training;Functional mobility training;Stair training;Therapeutic activities;Therapeutic exercise;Balance training;Patient/family education  AM-PAC PT "6 Clicks" Mobility Outcome Measure (Version 2)  Help needed turning from your back to your side while in a flat bed without using bedrails? 2  Help needed moving from lying on your back to sitting on the side of a flat bed without using bedrails? 2  Help needed moving to and from a bed to a chair (including a wheelchair)? 2  Help needed standing up from a chair using your arms (e.g., wheelchair or bedside chair)? 2  Help needed to walk in hospital room? 1  Help needed climbing 3-5 steps with a railing?  1  6 Click Score 10  Consider Recommendation of Discharge To: CIR/SNF/LTACH  Progressive Mobility  What is the highest level of mobility based on the progressive mobility assessment? Level 3 (Stands with assist) - Balance while standing  and cannot march in place  Mobility Out of bed to chair with meals  PT Recommendation  Follow Up Recommendations Home health PT;Supervision/Assistance - 24 hour;Supervision for mobility/OOB  PT equipment None recommended by PT (pt owns RW)  Individuals Consulted  Consulted and Agree with Results and Recommendations Patient  Acute Rehab PT Goals  Patient Stated Goal Go home and get stronger  PT Goal Formulation With patient  Time For Goal Achievement 09/25/21  Potential to Achieve Goals Good  PT Time Calculation  PT Start Time (ACUTE ONLY) 0935  PT Stop Time (ACUTE ONLY) 1010  PT Time Calculation (  min) (ACUTE ONLY) 35 min  PT General Charges  $$ ACUTE PT VISIT 1 Visit  PT Evaluation  $PT Eval Low Complexity 1 Low  PT Treatments  $Therapeutic Activity 8-22 mins        Festus Barren PT, DPT  Acute Rehabilitation Services  Office [phone]  09/11/2021, 2:32 PM

## 2022-02-14 ENCOUNTER — Emergency Department (HOSPITAL_COMMUNITY): Payer: Medicare Other

## 2022-02-14 ENCOUNTER — Encounter (HOSPITAL_COMMUNITY): Payer: Self-pay | Admitting: Emergency Medicine

## 2022-02-14 ENCOUNTER — Inpatient Hospital Stay (HOSPITAL_COMMUNITY)
Admission: EM | Admit: 2022-02-14 | Discharge: 2022-02-16 | DRG: 392 | Disposition: A | Discharge status: Home Health | Payer: Medicare Other | Attending: Family Medicine | Admitting: Family Medicine

## 2022-02-14 ENCOUNTER — Other Ambulatory Visit: Payer: Self-pay

## 2022-02-14 DIAGNOSIS — K602 Anal fissure, unspecified: Secondary | ICD-10-CM | POA: Diagnosis present

## 2022-02-14 DIAGNOSIS — Z87891 Personal history of nicotine dependence: Secondary | ICD-10-CM | POA: Diagnosis not present

## 2022-02-14 DIAGNOSIS — Z8673 Personal history of transient ischemic attack (TIA), and cerebral infarction without residual deficits: Secondary | ICD-10-CM | POA: Diagnosis not present

## 2022-02-14 DIAGNOSIS — J9611 Chronic respiratory failure with hypoxia: Secondary | ICD-10-CM | POA: Diagnosis present

## 2022-02-14 DIAGNOSIS — Z833 Family history of diabetes mellitus: Secondary | ICD-10-CM | POA: Diagnosis not present

## 2022-02-14 DIAGNOSIS — E46 Unspecified protein-calorie malnutrition: Secondary | ICD-10-CM | POA: Diagnosis present

## 2022-02-14 DIAGNOSIS — F32A Depression, unspecified: Secondary | ICD-10-CM | POA: Diagnosis present

## 2022-02-14 DIAGNOSIS — Z888 Allergy status to other drugs, medicaments and biological substances status: Secondary | ICD-10-CM

## 2022-02-14 DIAGNOSIS — K5900 Constipation, unspecified: Secondary | ICD-10-CM

## 2022-02-14 DIAGNOSIS — K5289 Other specified noninfective gastroenteritis and colitis: Secondary | ICD-10-CM | POA: Diagnosis not present

## 2022-02-14 DIAGNOSIS — Z79899 Other long term (current) drug therapy: Secondary | ICD-10-CM | POA: Diagnosis not present

## 2022-02-14 DIAGNOSIS — Z9981 Dependence on supplemental oxygen: Secondary | ICD-10-CM

## 2022-02-14 DIAGNOSIS — G4700 Insomnia, unspecified: Secondary | ICD-10-CM | POA: Diagnosis present

## 2022-02-14 DIAGNOSIS — Z8249 Family history of ischemic heart disease and other diseases of the circulatory system: Secondary | ICD-10-CM | POA: Diagnosis not present

## 2022-02-14 DIAGNOSIS — J449 Chronic obstructive pulmonary disease, unspecified: Secondary | ICD-10-CM | POA: Diagnosis present

## 2022-02-14 DIAGNOSIS — K5641 Fecal impaction: Secondary | ICD-10-CM | POA: Diagnosis not present

## 2022-02-14 DIAGNOSIS — Z20822 Contact with and (suspected) exposure to covid-19: Secondary | ICD-10-CM | POA: Diagnosis present

## 2022-02-14 DIAGNOSIS — Z885 Allergy status to narcotic agent status: Secondary | ICD-10-CM | POA: Diagnosis not present

## 2022-02-14 DIAGNOSIS — J9610 Chronic respiratory failure, unspecified whether with hypoxia or hypercapnia: Secondary | ICD-10-CM | POA: Diagnosis present

## 2022-02-14 DIAGNOSIS — N200 Calculus of kidney: Secondary | ICD-10-CM | POA: Diagnosis present

## 2022-02-14 DIAGNOSIS — Z6821 Body mass index (BMI) 21.0-21.9, adult: Secondary | ICD-10-CM | POA: Diagnosis not present

## 2022-02-14 DIAGNOSIS — I10 Essential (primary) hypertension: Secondary | ICD-10-CM | POA: Diagnosis present

## 2022-02-14 DIAGNOSIS — N12 Tubulo-interstitial nephritis, not specified as acute or chronic: Secondary | ICD-10-CM | POA: Diagnosis present

## 2022-02-14 DIAGNOSIS — D75839 Thrombocytosis, unspecified: Secondary | ICD-10-CM | POA: Diagnosis present

## 2022-02-14 LAB — CBC WITH DIFFERENTIAL/PLATELET
Abs Immature Granulocytes: 0.03 10*3/uL (ref 0.00–0.07)
Basophils Absolute: 0.1 10*3/uL (ref 0.0–0.1)
Basophils Relative: 1 %
Eosinophils Absolute: 0.1 10*3/uL (ref 0.0–0.5)
Eosinophils Relative: 1 %
HCT: 46.8 % — ABNORMAL HIGH (ref 36.0–46.0)
Hemoglobin: 13.8 g/dL (ref 12.0–15.0)
Immature Granulocytes: 0 %
Lymphocytes Relative: 13 %
Lymphs Abs: 1.2 10*3/uL (ref 0.7–4.0)
MCH: 24.7 pg — ABNORMAL LOW (ref 26.0–34.0)
MCHC: 29.5 g/dL — ABNORMAL LOW (ref 30.0–36.0)
MCV: 83.9 fL (ref 80.0–100.0)
Monocytes Absolute: 0.5 10*3/uL (ref 0.1–1.0)
Monocytes Relative: 5 %
Neutro Abs: 7.5 10*3/uL (ref 1.7–7.7)
Neutrophils Relative %: 80 %
Platelets: 511 10*3/uL — ABNORMAL HIGH (ref 150–400)
RBC: 5.58 MIL/uL — ABNORMAL HIGH (ref 3.87–5.11)
RDW: 14.1 % (ref 11.5–15.5)
WBC: 9.4 10*3/uL (ref 4.0–10.5)
nRBC: 0 % (ref 0.0–0.2)

## 2022-02-14 LAB — URINALYSIS, ROUTINE W REFLEX MICROSCOPIC
Bilirubin Urine: NEGATIVE
Glucose, UA: NEGATIVE mg/dL
Ketones, ur: NEGATIVE mg/dL
Nitrite: POSITIVE — AB
Protein, ur: 100 mg/dL — AB
RBC / HPF: >50 RBC/hpf — ABNORMAL HIGH (ref 0–5)
Specific Gravity, Urine: 1.042 — ABNORMAL HIGH (ref 1.005–1.030)
WBC, UA: >50 WBC/hpf — ABNORMAL HIGH (ref 0–5)
pH: 7 (ref 5.0–8.0)

## 2022-02-14 LAB — COMPREHENSIVE METABOLIC PANEL
ALT: 8 U/L (ref 0–44)
AST: 14 U/L — ABNORMAL LOW (ref 15–41)
Albumin: 3 g/dL — ABNORMAL LOW (ref 3.5–5.0)
Alkaline Phosphatase: 97 U/L (ref 38–126)
Anion gap: 9 (ref 5–15)
BUN: 16 mg/dL (ref 8–23)
CO2: 30 mmol/L (ref 22–32)
Calcium: 9.1 mg/dL (ref 8.9–10.3)
Chloride: 99 mmol/L (ref 98–111)
Creatinine, Ser: 0.73 mg/dL (ref 0.44–1.00)
GFR, Estimated: >60 mL/min (ref >60)
Glucose, Bld: 104 mg/dL — ABNORMAL HIGH (ref 70–99)
Potassium: 3.9 mmol/L (ref 3.5–5.1)
Sodium: 138 mmol/L (ref 135–145)
Total Bilirubin: 0.3 mg/dL (ref 0.3–1.2)
Total Protein: 7.7 g/dL (ref 6.5–8.1)

## 2022-02-14 LAB — RESP PANEL BY RT-PCR (FLU A&B, COVID) ARPGX2
Influenza A by PCR: NEGATIVE
Influenza B by PCR: NEGATIVE
SARS Coronavirus 2 by RT PCR: NEGATIVE

## 2022-02-14 LAB — PROTIME-INR
INR: 1 (ref 0.8–1.2)
Prothrombin Time: 13.1 seconds (ref 11.4–15.2)

## 2022-02-14 MED ORDER — POLYETHYLENE GLYCOL 3350 17 GM/SCOOP PO POWD
1.0000 | Freq: Once | ORAL | Status: DC
  Filled 2022-02-14: qty 255

## 2022-02-14 MED ORDER — DULOXETINE HCL 60 MG PO CPEP
60.0000 mg | ORAL_CAPSULE | Freq: Every day | ORAL | Status: DC
  Administered 2022-02-14 – 2022-02-16 (×3): 60 mg via ORAL
  Filled 2022-02-14: qty 2
  Filled 2022-02-14 (×2): qty 1

## 2022-02-14 MED ORDER — VITAMIN B-12 1000 MCG PO TABS
1000.0000 ug | ORAL_TABLET | Freq: Every day | ORAL | Status: DC
  Administered 2022-02-14 – 2022-02-16 (×3): 1000 ug via ORAL
  Filled 2022-02-14 (×4): qty 1

## 2022-02-14 MED ORDER — FENTANYL CITRATE PF 50 MCG/ML IJ SOSY
25.0000 ug | PREFILLED_SYRINGE | Freq: Once | INTRAMUSCULAR | Status: AC
  Administered 2022-02-14: 25 ug via INTRAVENOUS
  Filled 2022-02-14: qty 1

## 2022-02-14 MED ORDER — FENTANYL CITRATE PF 50 MCG/ML IJ SOSY
50.0000 ug | PREFILLED_SYRINGE | Freq: Once | INTRAMUSCULAR | Status: AC
  Administered 2022-02-14: 50 ug via INTRAVENOUS
  Filled 2022-02-14: qty 1

## 2022-02-14 MED ORDER — FLEET ENEMA 7-19 GM/118ML RE ENEM: 1.0000 | ENEMA | Freq: Four times a day (QID) | RECTAL | Status: DC

## 2022-02-14 MED ORDER — LIDOCAINE-EPINEPHRINE 2 %-1:100000 IJ SOLN
20.0000 mL | Freq: Once | INTRAMUSCULAR | Status: AC
  Administered 2022-02-14: 20 mL via INTRADERMAL
  Filled 2022-02-14: qty 1

## 2022-02-14 MED ORDER — ALBUTEROL SULFATE HFA 108 (90 BASE) MCG/ACT IN AERS: 2.0000 | INHALATION_SPRAY | RESPIRATORY_TRACT | Status: DC | PRN

## 2022-02-14 MED ORDER — ACETAMINOPHEN 650 MG RE SUPP: 650.0000 mg | Freq: Four times a day (QID) | RECTAL | Status: DC | PRN

## 2022-02-14 MED ORDER — SODIUM CHLORIDE 0.9 % IV SOLN: INTRAVENOUS | Status: DC

## 2022-02-14 MED ORDER — ACETAMINOPHEN 325 MG PO TABS
650.0000 mg | ORAL_TABLET | Freq: Four times a day (QID) | ORAL | Status: DC | PRN
  Administered 2022-02-14 – 2022-02-16 (×4): 650 mg via ORAL
  Filled 2022-02-14 (×4): qty 2

## 2022-02-14 MED ORDER — CEFTRIAXONE SODIUM 2 G IJ SOLR
2.0000 g | Freq: Once | INTRAMUSCULAR | Status: AC
  Administered 2022-02-14: 2 g via INTRAVENOUS
  Filled 2022-02-14: qty 20

## 2022-02-14 MED ORDER — POLYETHYLENE GLYCOL 3350 17 G PO PACK
17.0000 g | PACK | Freq: Every day | ORAL | Status: DC
  Administered 2022-02-15: 17 g via ORAL
  Filled 2022-02-14: qty 1

## 2022-02-14 MED ORDER — METRONIDAZOLE 500 MG/100ML IV SOLN
500.0000 mg | Freq: Two times a day (BID) | INTRAVENOUS | Status: DC
  Administered 2022-02-14 – 2022-02-15 (×2): 500 mg via INTRAVENOUS
  Filled 2022-02-14 (×2): qty 100

## 2022-02-14 MED ORDER — IOHEXOL 300 MG/ML  SOLN
100.0000 mL | Freq: Once | INTRAMUSCULAR | Status: AC | PRN
  Administered 2022-02-14: 100 mL via INTRAVENOUS

## 2022-02-14 MED ORDER — ALBUTEROL SULFATE (2.5 MG/3ML) 0.083% IN NEBU: 2.5000 mg | INHALATION_SOLUTION | RESPIRATORY_TRACT | Status: DC | PRN

## 2022-02-14 MED ORDER — SODIUM CHLORIDE 0.9 % IV SOLN
2.0000 g | INTRAVENOUS | Status: DC
  Administered 2022-02-15: 2 g via INTRAVENOUS
  Filled 2022-02-14: qty 20

## 2022-02-14 MED ORDER — SORBITOL 70 % SOLN
200.0000 mL | TOPICAL_OIL | Freq: Once | ORAL | Status: AC
  Administered 2022-02-14: 200 mL via RECTAL
  Filled 2022-02-14: qty 60

## 2022-02-14 MED ORDER — SODIUM CHLORIDE 0.9 % IV SOLN: 1.0000 g | INTRAVENOUS | Status: DC

## 2022-02-14 MED ORDER — PSYLLIUM 95 % PO PACK
1.0000 | PACK | Freq: Three times a day (TID) | ORAL | Status: DC
  Administered 2022-02-14 – 2022-02-16 (×4): 1 via ORAL
  Filled 2022-02-14 (×6): qty 1

## 2022-02-14 MED ORDER — LIDOCAINE HCL URETHRAL/MUCOSAL 2 % EX GEL
1.0000 "application " | Freq: Once | CUTANEOUS | Status: AC
  Administered 2022-02-14: 1 via TOPICAL
  Filled 2022-02-14: qty 11

## 2022-02-14 MED ORDER — METRONIDAZOLE 500 MG/100ML IV SOLN
500.0000 mg | Freq: Once | INTRAVENOUS | Status: AC
  Administered 2022-02-14: 500 mg via INTRAVENOUS
  Filled 2022-02-14: qty 100

## 2022-02-14 MED ORDER — AMLODIPINE BESYLATE 5 MG PO TABS
2.5000 mg | ORAL_TABLET | Freq: Every day | ORAL | Status: DC
  Administered 2022-02-14 – 2022-02-16 (×3): 2.5 mg via ORAL
  Filled 2022-02-14 (×3): qty 1

## 2022-02-14 NOTE — Treatment Plan (Signed)
Urology Treatment Plan  This is an 86 year old female with a history of COPD, chronic respiratory failure, CVA, hypertension who was admitted to the hospitalist team due to fecal impaction and severe constipation.  We reached out to due to incidental finding on her CT scan of a left staghorn calculus without hydronephrosis.  We will arrange outpatient follow-up for the patient to discuss observation versus intervention for left staghorn stone.  This has been requested.  In the interim primary team will treat her for constipation.  Urinalysis is pending but would suggest treating if concerning for infection.  Bishop Limbo, MD [facility]

## 2022-02-14 NOTE — ED Provider Notes (Signed)
[facility] DEPT Provider Note   CSN: [id number] Arrival date & time: 02/14/22  0433     History  Chief Complaint  Patient presents with   Constipation    [name] is a 86 y.o. female who presents for Fecal impaction. Patient states that she has been unable to make a BM for the past week. She has been taking laxatives but has been unable to make a bowel movement.  She complains of severe pain and fullness with rectal urgency and her rectum and anus.  She denies abdominal pain.   Constipation     Home Medications Prior to Admission medications   Medication Sig Start Date End Date Taking? Authorizing Provider  albuterol (VENTOLIN HFA) 108 (90 Base) MCG/ACT inhaler Inhale 2 puffs into the lungs every 4 (four) hours as needed for wheezing, cough or shortness of breath.    [provider]  aspirin EC 81 MG tablet Take 81 mg by mouth daily. Swallow whole.    [provider]  Ibuprofen-diphenhydrAMINE Cit (ADVIL PM) 200-38 MG TABS Take 1 tablet by mouth as needed (for sleep).    [provider]  Menthol-Methyl Salicylate (MUSCLE RUB) 10-15 % CREA Apply 1 application topically 2 (two) times daily as needed for muscle pain. 05/26/19   Love, Ivan Anchors, PA-C  psyllium (HYDROCIL/METAMUCIL) 95 % PACK Take 1 packet by mouth 3 (three) times daily. 09/11/21   Dwyane Dee, MD  vitamin B-12 1000 MCG tablet Take 1 tablet (1,000 mcg total) by mouth daily. 09/13/21   Dwyane Dee, MD      Allergies    Ambien [zolpidem] and Codeine    Review of Systems   Review of Systems  Gastrointestinal:  Positive for constipation.   Physical Exam Updated Vital Signs BP 116/77 (BP Location: Right Arm)    Pulse (!) 108    Temp 97.8 F (36.6 C) (Oral)    Resp 18    Ht 4\' 6"  (1.372 m)    Wt 41.3 kg    SpO2 98%    BMI 21.94 kg/m  Physical Exam Vitals and nursing note reviewed.  Constitutional:      General: She is not in acute distress.     Appearance: She is well-developed. She is not diaphoretic.     Comments: Very thin female, contracted  HENT:     Head: Normocephalic and atraumatic.     Right Ear: External ear normal.     Left Ear: External ear normal.     Nose: Nose normal.     Mouth/Throat:     Mouth: Mucous membranes are moist.  Eyes:     General: No scleral icterus.    Conjunctiva/sclera: Conjunctivae normal.  Cardiovascular:     Rate and Rhythm: Normal rate and regular rhythm.     Heart sounds: Normal heart sounds. No murmur heard.   No friction rub. No gallop.  Pulmonary:     Effort: Pulmonary effort is normal. No respiratory distress.     Breath sounds: Normal breath sounds.  Abdominal:     General: Bowel sounds are normal. There is no distension.     Palpations: Abdomen is soft. There is no mass.     Tenderness: There is no abdominal tenderness. There is no guarding.  Genitourinary:    Comments: There is stool partially expelled soft brown stool from the anus.   Musculoskeletal:     Cervical back: Normal range of motion.  Skin:    General: Skin  is warm and dry.  Neurological:     Mental Status: She is alert and oriented to person, place, and time.  Psychiatric:        Behavior: Behavior normal.    ED Results / Procedures / Treatments   Labs (all labs ordered are listed, but only abnormal results are displayed) Labs Reviewed - No data to display  EKG None  Radiology No results found.  Procedures Fecal disimpaction  Date/Time: 02/14/2022 5:31 AM Performed by: Margarita Mail, PA-C Authorized by: Margarita Mail, PA-C  Consent: Verbal consent obtained. Consent given by: patient Patient identity confirmed: verbally with patient and provided demographic data Preparation: Patient was prepped and draped in the usual sterile fashion. Local anesthesia used: no  Anesthesia: Local anesthesia used: no  Sedation: Patient sedated: no  Patient tolerance: patient tolerated the procedure well  with no immediate complications Comments: Patient with soft stool in the rectum.  Small fissure now actively bleeding after disimpaction at 6:00      Medications Ordered in ED Medications  lidocaine (XYLOCAINE) 2 % jelly 1 application (has no administration in time range)  lidocaine-EPINEPHrine (XYLOCAINE W/EPI) 2 %-1:100000 (with pres) injection 20 mL (has no administration in time range)    ED Course/ Medical Decision Making/ A&P                           Medical Decision Making Amount and/or Complexity of Data Reviewed Labs: ordered. Radiology: ordered.  Risk Prescription drug management. Decision regarding hospitalization.    Patient here with fecal impaction. I performed disimpaction.  I have placed LET and urojet for rectal pain and bleeding. Labs ordered  will receive smog enema- sign out at shift change to PA Badalamante.     Final Clinical Impression(s) / ED Diagnoses Final diagnoses:  None    Rx / DC Orders ED Discharge Orders     None         Margarita Mail, PA-C 02/15/22 1815    Lucrezia Starch, MD 02/16/22 1258

## 2022-02-14 NOTE — ED Provider Notes (Signed)
Care of patient assumed from [place] at [phone].  Agree with history, physical exam and plan.  See their note for further details. Briefly, 86 year old female presents to the emergency department with a chief complaint of fecal impaction.  Patient has been noted to have a bowel movement for last week.  Has been taking laxatives without improvement.  Complains of rectal pain, fullness, and rectal urgency.  Physical Exam  BP 116/77 (BP Location: Right Arm)    Pulse (!) 108    Temp 97.8 F (36.6 C) (Oral)    Resp 18    Ht 4\' 6"  (1.372 m)    Wt 41.3 kg    SpO2 98%    BMI 21.94 kg/m   Physical Exam Vitals and nursing note reviewed. Exam conducted with a chaperone present (Female nurse tech present as chaperone).  Constitutional:      General: She is not in acute distress.    Appearance: She is not ill-appearing, toxic-appearing or diaphoretic.  HENT:     Head: Normocephalic.  Eyes:     General: No scleral icterus.       Right eye: No discharge.        Left eye: No discharge.  Cardiovascular:     Rate and Rhythm: Normal rate.  Pulmonary:     Effort: Pulmonary effort is normal.  Abdominal:     General: Abdomen is flat. There is no distension. There are no signs of injury.     Palpations: Abdomen is soft. There is no mass or pulsatile mass.     Tenderness: There is abdominal tenderness in the left lower quadrant. There is no guarding or rebound.  Genitourinary:    Rectum: Anal fissure present. No external hemorrhoid.     Comments: No rectal bleeding.  Light brown stool noted in rectal vault.  No melena Skin:    General: Skin is warm and dry.  Neurological:     General: No focal deficit present.     Mental Status: She is alert.  Psychiatric:        Behavior: Behavior is cooperative.    Procedures  Procedures  ED Course / MDM    Medical Decision Making Amount and/or Complexity of Data Reviewed Labs: ordered. Radiology: ordered.  Risk Prescription drug management. Decision  regarding hospitalization.   After for compassion from previous provider patient noticed to be bleeding from anal fissure.  Let was applied.  Patient is to receive smog enema.  Patient also started having generalized abdominal pain after fecal disimpaction.  Has history of hematochezia.  Will obtain CBC, CMP, INR and CT abdomen pelvis.  On my assessment rectal bleeding has resolved.  Patient noted to have light brown stool in rectal vault.  No melena.  Patient denies any recent rectal bleeding or melena.  Lab work was independently reviewed myself.  Findings include: -Hemoglobin 13.8 -CMP shows albumin 3.1 -INR within normal limits  CT abdomen pelvis shows: -Large left renal staghorn calculus, 5 cm with left kidney and renal pelvis inflammation.  No obstructive uropathy. -Large stool in the rectum, 13 cm with fecal impaction.  Possible stercoral colitis  Due to possible stercoral colitis we will start patient on ceftriaxone and Flagyl.  We will consult general surgery.  Patient will likely need admission for observation.  On serial reexamination abdomen remains soft, nondistended, with tenderness present.  Due to renal pelvis inflammation noted on CT scan we will obtain urinalysis.  I spoke to Dr. Kieth Brightly with general surgery who will  see the patient for consult.  We will admit patient to hospitalist service.  I spoke with Dr. Marylyn Ishihara who will see the patient for admission.       Loni Beckwith, PA-C 02/14/22 1557    Regan Lemming, MD 02/14/22 [phone]

## 2022-02-14 NOTE — ED Triage Notes (Signed)
Patient arrives from home, hx of CVA. Patient complains of no BM x1 week. Patient endorses lower abdominal pain, denies nausea and vomiting or rectal bleeding.

## 2022-02-14 NOTE — H&P (Signed)
History and Physical    Patient: [name] OEU:[id number] DOB: 11-Mar-1936 DOA: 02/14/2022 DOS: the patient was seen and examined on 02/14/2022 PCP: [name], MD  Patient coming from: Home  Chief Complaint:  Chief Complaint  Patient presents with   Constipation    HPI: [name] is a 86 y.o. female with medical history significant of HTN, depression, COPD with chronic hypoxic respiratory failure (on 2-3L O2 at home). Presenting with abdominal pain, constipation. She has had problems with chronic constipation for some time now. States that she hasn't had a BM in at least a week. She has been taking her laxatives as instructed but not having success. She had lower abdominal and rectal discomfort last night and this morning. In addition she reports a generalized increasing weakness. She became concerned and came to the hospital for help. She denies any other aggravating or alleviating factors.    Review of Systems: As mentioned in the history of present illness. All other systems reviewed and are negative. Past Medical History:  Diagnosis Date   Arthritis    CAP (community acquired pneumonia)    multiple admissions   Chronic back pain 08/22/2018   Colitis 03/19/2014   Compression fx, lumbar spine (HCC)    COPD (chronic obstructive pulmonary disease) ([facility])    Delirium    Hypertension    On home oxygen therapy 06-14-13   continuos 2.5l/m nasally-24/7   Osteoporosis    Sacral fracture ([facility]) 2014   Past Surgical History:  Procedure Laterality Date   ABDOMINAL HYSTERECTOMY     BIOPSY  05/24/2019   Procedure: BIOPSY;  Surgeon: Ronald Lobo, MD;  Location: [facility];  Service: Endoscopy;;   COLONOSCOPY WITH PROPOFOL Left 03/20/2020   Procedure: COLONOSCOPY WITH PROPOFOL;  Surgeon: Wilford Corner, MD;  Location: [facility];  Service: Gastroenterology;  Laterality: Left;  needs ultraslim colonoscope   ESOPHAGOGASTRODUODENOSCOPY N/A 06/15/2013   Procedure:  ESOPHAGOGASTRODUODENOSCOPY (EGD);  Surgeon: [name], MD;  Location: [facility];  Service: Endoscopy;  Laterality: N/A;   ESOPHAGOGASTRODUODENOSCOPY (EGD) WITH PROPOFOL N/A 05/24/2019   Procedure: ESOPHAGOGASTRODUODENOSCOPY (EGD) WITH PROPOFOL;  Surgeon: Ronald Lobo, MD;  Location: [facility];  Service: Endoscopy;  Laterality: N/A;   IR GENERIC HISTORICAL  10/15/2016   IR KYPHO LUMBAR INC FX REDUCE BONE BX UNI/BIL CANNULATION INC/IMAGING 10/15/2016 Luanne Bras, MD [facility]   POLYPECTOMY  03/20/2020   Procedure: POLYPECTOMY;  Surgeon: Wilford Corner, MD;  Location: [facility] ENDOSCOPY;  Service: Gastroenterology;;   sacroplasty  06-14-13   05-10-13 IVR [facility] for fracture stabilization   Social History:  reports that she quit smoking about 10 years ago. Her smoking use included cigarettes. She has never used smokeless tobacco. She reports that she does not drink alcohol and does not use drugs.  Allergies  Allergen Reactions   Ambien [Zolpidem] Other (See Comments)    Hallucinations and paranoid   Codeine Hives and Nausea And Vomiting    Family History  Problem Relation Age of Onset   Hypertension Sister    Diabetes Mellitus II Sister     Prior to Admission medications   Medication Sig Start Date End Date Taking? Authorizing Provider  albuterol (VENTOLIN HFA) 108 (90 Base) MCG/ACT inhaler Inhale 2 puffs into the lungs every 4 (four) hours as needed for wheezing, cough or shortness of breath.   Yes [provider]  amLODipine (NORVASC) 2.5 MG tablet Take 2.5 mg by mouth daily.   Yes [provider]  DULoxetine (CYMBALTA) 60  MG capsule Take 60 mg by mouth daily.   Yes [provider]  vitamin B-12 1000 MCG tablet Take 1 tablet (1,000 mcg total) by mouth daily. 09/13/21  Yes Dwyane Dee, MD  aspirin EC 81 MG tablet Take 81 mg by mouth daily. Swallow whole. Patient not taking: Reported on 02/14/2022    [provider]   Ibuprofen-diphenhydrAMINE Cit (ADVIL PM) 200-38 MG TABS Take 1 tablet by mouth as needed (for sleep). Patient not taking: Reported on 02/14/2022    [provider]  Menthol-Methyl Salicylate (MUSCLE RUB) 10-15 % CREA Apply 1 application topically 2 (two) times daily as needed for muscle pain. Patient not taking: Reported on 02/14/2022 05/26/19   Love, Ivan Anchors, PA-C  psyllium (HYDROCIL/METAMUCIL) 95 % PACK Take 1 packet by mouth 3 (three) times daily. Patient not taking: Reported on 02/14/2022 09/11/21   Dwyane Dee, MD    Physical Exam: Vitals:   02/14/22 0730 02/14/22 0900 02/14/22 1000 02/14/22 1100  BP: 116/67 118/80 119/87 110/62  Pulse: 96 (!) 111 (!) 102 95  Resp: 16 18 18 18   Temp:      TempSrc:      SpO2: 98% 97% 100% 98%  Weight:      Height:       General: 86 y.o. female resting in bed in NAD Eyes: PERRL, normal sclera ENMT: Nares patent w/o discharge, orophaynx clear, dentition poor, ears w/o discharge/lesions/ulcers Neck: thin, trachea midline Cardiovascular: RRR, +S1, S2, no m/g/r, equal pulses throughout Respiratory: CTABL, no w/r/r, normal WOB GI: BS+, ND, mild TTP to LLQ, no masses noted, no organomegaly noted MSK: No e/c/c Neuro: A&O x 3, chronic left hemiparesis after stroke   Data Reviewed:  CT ab/pelvis: Progressed since 2021 and now large left renal staghorn calculus - 5 cm. Left kidney and renal pelvis appear inflamed, but no delayed nephrogram to indicate acute obstructive uropathy. Consider Pyonephrosis. 2. Large stool ball in the rectum (13 cm) with fecal impaction, possible Stercoral Colitis.  Assessment and Plan: No notes have been filed under this hospital service. Service: Hospitalist Stercoral colitis Fecal impation     - admit to inpt, med-surg     - continue abx     - appreciate surgery assistance, continue bowel regimen/fluids  Generalized weakness Hx of CVA w/ left side residuals     - will have PT review     - otherwise,  continue home regimen  HTN     - continue home regimen  Depression     - continue home regimen  COPD Chronic hypoxic respiratory failure on chronic supplemental O2     - continue home regimen and outpt follow up  Pyonephritis     - urology to arrange outpt follow up     - UA is dirty, follow Ucx     - CT as above     - continue rocephin  Protein-calorie malnutrition; unknown degree     - dietitian consult  Advance Care Planning:   Code Status: FULL  Consults: EDP spoke with [facility], urology  Family Communication: None at bedside  Severity of Illness: The appropriate patient status for this patient is INPATIENT. Inpatient status is judged to be reasonable and necessary in order to provide the required intensity of service to ensure the patient's safety. The patient's presenting symptoms, physical exam findings, and initial radiographic and laboratory data in the context of their chronic comorbidities is felt to place them at high risk for further clinical deterioration. Furthermore,  it is not anticipated that the patient will be medically stable for discharge from the hospital within 2 midnights of admission.   * I certify that at the point of admission it is my clinical judgment that the patient will require inpatient hospital care spanning beyond 2 midnights from the point of admission due to high intensity of service, high risk for further deterioration and high frequency of surveillance required.*  Author: Jonnie Finner, DO 02/14/2022 12:38 PM  For on call review [email or web address].

## 2022-02-14 NOTE — Consult Note (Addendum)
Reason for Consult:constipation Referring Provider: [name] is an 86 y.o. female.  HPI: 86 yo female with long history of constipation. She was disimpacted 6 months ago and then did not have a bowel movement for a week and was disimpacted again today. She had abdominal pain but that is much better after the enema and bowel movement. She denies nausea or vomiting.  Past Medical History:  Diagnosis Date   Arthritis    CAP (community acquired pneumonia)    multiple admissions   Chronic back pain 08/22/2018   Colitis 03/19/2014   Compression fx, lumbar spine (HCC)    COPD (chronic obstructive pulmonary disease) ([place])    Delirium    Hypertension    On home oxygen therapy 06-14-13   continuos 2.5l/m nasally-24/7   Osteoporosis    Sacral fracture ([place]) 2014    Past Surgical History:  Procedure Laterality Date   ABDOMINAL HYSTERECTOMY     BIOPSY  05/24/2019   Procedure: BIOPSY;  Surgeon: Adriana Lobo, MD;  Location: [place];  Service: Endoscopy;;   COLONOSCOPY WITH PROPOFOL Left 03/20/2020   Procedure: COLONOSCOPY WITH PROPOFOL;  Surgeon: Adriana Corner, MD;  Location: [place];  Service: Gastroenterology;  Laterality: Left;  needs ultraslim colonoscope   ESOPHAGOGASTRODUODENOSCOPY N/A 06/15/2013   Procedure: ESOPHAGOGASTRODUODENOSCOPY (EGD);  Surgeon: Adriana Ng, MD;  Location: [facility];  Service: Endoscopy;  Laterality: N/A;   ESOPHAGOGASTRODUODENOSCOPY (EGD) WITH PROPOFOL N/A 05/24/2019   Procedure: ESOPHAGOGASTRODUODENOSCOPY (EGD) WITH PROPOFOL;  Surgeon: Adriana Lobo, MD;  Location: [place];  Service: Endoscopy;  Laterality: N/A;   IR GENERIC HISTORICAL  10/15/2016   IR KYPHO LUMBAR INC FX REDUCE BONE BX UNI/BIL CANNULATION INC/IMAGING 10/15/2016 Adriana Bras, MD [facility]   POLYPECTOMY  03/20/2020   Procedure: POLYPECTOMY;  Surgeon: Adriana Corner, MD;  Location: [facility] END[facility]COPY;  Service: Gastroenterology;;    sacroplasty  06-14-13   05-10-13 IVR CONE for fracture stabilization    Family History  Problem Relation Age of Onset   Hypertension Sister    Diabetes Mellitus II Sister     Social History:  reports that she quit smoking about 10 years ago. Her smoking use included cigarettes. She has never used smokeless tobacco. She reports that she does not drink alcohol and does not use drugs.  Allergies:  Allergies  Allergen Reactions   Ambien [Zolpidem] Other (See Comments)    Hallucinations and paranoid   Codeine Hives and Nausea And Vomiting    Medications: I have reviewed the patient's current medications.  Results for orders placed or performed during the hospital encounter of 02/14/22 (from the past 48 hour(s))  CBC with Differential     Status: Abnormal   Collection Time: 02/14/22  7:09 AM  Result Value Ref Range   WBC 9.4 4.0 - 10.5 K/uL   RBC 5.58 (H) 3.87 - 5.11 MIL/uL   Hemoglobin 13.8 12.0 - 15.0 g/dL   HCT 46.8 (H) 36.0 - 46.0 %   MCV 83.9 80.0 - 100.0 fL   MCH 24.7 (L) 26.0 - 34.0 pg   MCHC 29.5 (L) 30.0 - 36.0 g/dL   RDW 14.1 11.5 - 15.5 %   Platelets 511 (H) 150 - 400 K/uL   nRBC 0.0 0.0 - 0.2 %   Neutrophils Relative % 80 %   Neutro Abs 7.5 1.7 - 7.7 K/uL   Lymphocytes Relative 13 %   Lymphs Abs 1.2 0.7 - 4.0 K/uL   Monocytes Relative 5 %  Monocytes Absolute 0.5 0.1 - 1.0 K/uL   Eosinophils Relative 1 %   Eosinophils Absolute 0.1 0.0 - 0.5 K/uL   Basophils Relative 1 %   Basophils Absolute 0.1 0.0 - 0.1 K/uL   Immature Granulocytes 0 %   Abs Immature Granulocytes 0.03 0.00 - 0.07 K/uL    Comment: Performed at [facility], [place] [address] [place], [place] [address]  Comprehensive metabolic panel     Status: Abnormal   Collection Time: 02/14/22  7:09 AM  Result Value Ref Range   Sodium 138 135 - 145 mmol/L   Potassium 3.9 3.5 - 5.1 mmol/L   Chloride 99 98 - 111 mmol/L   CO2 30 22 - 32 mmol/L   Glucose, Bld 104 (H) 70 - 99 mg/dL     Comment: Glucose reference range applies only to samples taken after fasting for at least 8 hours.   BUN 16 8 - 23 mg/dL   Creatinine, Ser 0.73 0.44 - 1.00 mg/dL   Calcium 9.1 8.9 - 10.3 mg/dL   Total Protein 7.7 6.5 - 8.1 g/dL   Albumin 3.0 (L) 3.5 - 5.0 g/dL   AST 14 (L) 15 - 41 U/L   ALT 8 0 - 44 U/L   Alkaline Phosphatase 97 38 - 126 U/L   Total Bilirubin 0.3 0.3 - 1.2 mg/dL   GFR, Estimated >60 >60 mL/min    Comment: (NOTE) Calculated using the CKD-EPI Creatinine Equation (2021)    Anion gap 9 5 - 15    Comment: Performed at [facility], [place] [address] [place], [place] [address]  Protime-INR     Status: None   Collection Time: 02/14/22  7:09 AM  Result Value Ref Range   Prothrombin Time 13.1 11.4 - 15.2 seconds   INR 1.0 0.8 - 1.2    Comment: (NOTE) INR goal varies based on device and disease states. Performed at [facility], [place] [address], [place], [place] [address]   Urinalysis, Routine w reflex microscopic     Status: Abnormal   Collection Time: 02/14/22 10:03 AM  Result Value Ref Range   Color, Urine YELLOW YELLOW   APPearance CLOUDY (A) CLEAR   Specific Gravity, Urine 1.042 (H) 1.005 - 1.030   pH 7.0 5.0 - 8.0   Glucose, UA NEGATIVE NEGATIVE mg/dL   Hgb urine dipstick MODERATE (A) NEGATIVE   Bilirubin Urine NEGATIVE NEGATIVE   Ketones, ur NEGATIVE NEGATIVE mg/dL   Protein, ur 100 (A) NEGATIVE mg/dL   Nitrite POSITIVE (A) NEGATIVE   Leukocytes,Ua LARGE (A) NEGATIVE   RBC / HPF >50 (H) 0 - 5 RBC/hpf   WBC, UA >50 (H) 0 - 5 WBC/hpf   Bacteria, UA FEW (A) NONE SEEN   Squamous Epithelial / LPF 0-5 0 - 5   WBC Clumps PRESENT    Mucus PRESENT     Comment: Performed at [facility], [place] [address] [place], [place] [address]  Resp Panel by RT-PCR (Flu A&B, Covid) Nasopharyngeal Swab     Status: None   Collection Time: 02/14/22 10:03 AM   Specimen: Nasopharyngeal Swab; Nasopharyngeal(NP) swabs in vial  transport medium  Result Value Ref Range   SARS Coronavirus 2 by RT PCR NEGATIVE NEGATIVE    Comment: (NOTE) SARS-CoV-2 target nucleic acids are NOT DETECTED.  The SARS-CoV-2 RNA is generally detectable in upper respiratory specimens during the acute phase of infection. The lowest concentration of SARS-CoV-2 viral copies this assay can  detect is 138 copies/mL. A negative result does not preclude SARS-Cov-2 infection and should not be used as the sole basis for treatment or other patient management decisions. A negative result may occur with  improper specimen collection/handling, submission of specimen other than nasopharyngeal swab, presence of viral mutation(s) within the areas targeted by this assay, and inadequate number of viral copies(<138 copies/mL). A negative result must be combined with clinical observations, patient history, and epidemiological information. The expected result is Negative.  Fact Sheet for Patients:  EntrepreneurPulse.com.au  Fact Sheet for Healthcare Providers:  IncredibleEmployment.be  This test is no t yet approved or cleared by the [place] FDA and  has been authorized for detection and/or diagnosis of SARS-CoV-2 by FDA under an Emergency Use Authorization (EUA). This EUA will remain  in effect (meaning this test can be used) for the duration of the COVID-19 declaration under Section 564(b)(1) of the Act, 21 U.S.C.section 360bbb-3(b)(1), unless the authorization is terminated  or revoked sooner.       Influenza A by PCR NEGATIVE NEGATIVE   Influenza B by PCR NEGATIVE NEGATIVE    Comment: (NOTE) The Xpert Xpress SARS-CoV-2/FLU/RSV plus assay is intended as an aid in the diagnosis of influenza from Nasopharyngeal swab specimens and should not be used as a sole basis for treatment. Nasal washings and aspirates are unacceptable for Xpert Xpress SARS-CoV-2/FLU/RSV testing.  Fact Sheet for  Patients: EntrepreneurPulse.com.au  Fact Sheet for Healthcare Providers: IncredibleEmployment.be  This test is not yet approved or cleared by the [place] FDA and has been authorized for detection and/or diagnosis of SARS-CoV-2 by FDA under an Emergency Use Authorization (EUA). This EUA will remain in effect (meaning this test can be used) for the duration of the COVID-19 declaration under Section 564(b)(1) of the Act, 21 U.S.C. section 360bbb-3(b)(1), unless the authorization is terminated or revoked.  Performed at [facility], [place] [address], [place], [place] [address]     CT ABDOMEN PELVIS W CONTRAST  Result Date: 02/14/2022 CLINICAL DATA:  86 year old female with acute abdominal pain. EXAM: CT ABDOMEN AND PELVIS WITH CONTRAST TECHNIQUE: Multidetector CT imaging of the abdomen and pelvis was performed using the standard protocol following bolus administration of intravenous contrast. RADIATION DOSE REDUCTION: This exam was performed according to the departmental dose-optimization program which includes automated exposure control, adjustment of the mA and/or kV according to patient size and/or use of iterative reconstruction technique. CONTRAST:  135mL OMNIPAQUE IOHEXOL 300 MG/ML  SOLN COMPARISON:  03/16/2020. GI bleeding study 09/09/2021. FINDINGS: Lower chest: Lung base bronchiectasis. Lung base atelectasis and mild postinflammatory lung nodularity. No consolidation or pleural effusion. Hepatobiliary: Stable, negative. Pancreas: Negative. Spleen: Negative. Adrenals/Urinary Tract: Progressed and now large left renal staghorn calculus, 5 cm all told. The left kidney and renal pelvis appear mildly inflamed, but no delayed nephrogram on that side. Contralateral punctate calculus in the dependent right renal pelvis. No acute right hydronephrosis or inflammation. There is left ureter urothelial thickening. Unremarkable bladder.  Stomach/Bowel: Large stool ball in the rectum, up to 13 mm long axis and similar appearance in 2021. Questionable rectal wall thickening and mild inflammation. Numerous superimposed pelvic phleboliths. Nondilated upstream large bowel. No other large bowel inflammation identified. Diminutive or absent appendix. No dilated small bowel. Small gastric hiatal hernia. Decompressed intra-abdominal stomach, duodenum. No free air or free fluid. Vascular/Lymphatic: Aortoiliac calcified atherosclerosis. Bilateral iliac artery stenosis. Visible major arterial structures remain patent. Portal venous system is patent. No lymphadenopathy identified. Reproductive: Absent uterus. Diminutive or  absent ovaries. Other: No pelvic free fluid. Musculoskeletal: Osteopenia. Multilevel previous spinal compression fracture and augmentation. Prior sacroplasty. No acute osseous abnormality identified. IMPRESSION: 1. Progressed since 2021 and now large left renal staghorn calculus - 5 cm. Left kidney and renal pelvis appear inflamed, but no delayed nephrogram to indicate acute obstructive uropathy. Consider Pyonephrosis. 2. Large stool ball in the rectum (13 cm) with fecal impaction, possible Stercoral Colitis. 3. No other acute or inflammatory process identified in the abdomen or pelvis. 4. Advanced Aortic Atherosclerosis (ICD10-I70.0). Bronchiectasis. Chronic previously augmented spinal and sacral fractures. Electronically Signed   By: Genevie Ann M.D.   On: 02/14/2022 08:49    Review of Systems  Constitutional: Negative.   HENT: Negative.    Eyes: Negative.   Respiratory: Negative.    Cardiovascular: Negative.   Gastrointestinal:  Positive for constipation.  Genitourinary: Negative.   Musculoskeletal: Negative.   Skin: Negative.   Neurological: Negative.   Endo/Heme/Allergies: Negative.   Psychiatric/Behavioral: Negative.     PE Blood pressure 110/62, pulse 95, temperature 97.8 F (36.6 C), temperature source Oral, resp. rate  18, height 4\' 6"  (1.372 m), weight 41.3 kg, SpO2 98 %. Constitutional: NAD; conversant; no deformities Eyes: Moist conjunctiva; no lid lag; anicteric; PERRL Neck: Trachea midline; no thyromegaly Lungs: Normal respiratory effort; no tactile fremitus CV: RRR; no palpable thrills; no pitting edema GI: Abd soft, nontender; no palpable hepatosplenomegaly MSK: unable to assess gait; no clubbing/cyanosis Psychiatric: Appropriate affect; alert and oriented x3 Lymphatic: No palpable cervical or axillary lymphadenopathy Skin: No major subcutaneous nodules. Warm and dry   Assessment/Plan: 86 yo female with chronic constipation and concern for colitis/proctitis after having large stool ball in rectum -recommend large volume miralax to wash out constipation -recommend at least daily miralax for the rest of her life in addition to her metamucil -agree with course of antibiotics -ok for diet  I reviewed last 24 h vitals and pain scores, last 48 h intake and output, last 24 h labs and trends, and last 24 h imaging results.  This care required high  level of medical decision making.   [name] 02/14/2022, 11:54 AM

## 2022-02-14 NOTE — ED Notes (Signed)
Pt given Enema pt able to hold it for only a couple of minutes. Pt had large BM after. Pt states that she no longer feels there is stool in her rectum but she is still having a lot of pain in her stomach

## 2022-02-15 ENCOUNTER — Inpatient Hospital Stay (HOSPITAL_COMMUNITY): Payer: Medicare Other

## 2022-02-15 LAB — COMPREHENSIVE METABOLIC PANEL
ALT: 7 U/L (ref 0–44)
AST: 11 U/L — ABNORMAL LOW (ref 15–41)
Albumin: 2.5 g/dL — ABNORMAL LOW (ref 3.5–5.0)
Alkaline Phosphatase: 80 U/L (ref 38–126)
Anion gap: 5 (ref 5–15)
BUN: 13 mg/dL (ref 8–23)
CO2: 30 mmol/L (ref 22–32)
Calcium: 8.6 mg/dL — ABNORMAL LOW (ref 8.9–10.3)
Chloride: 103 mmol/L (ref 98–111)
Creatinine, Ser: 0.75 mg/dL (ref 0.44–1.00)
GFR, Estimated: >60 mL/min (ref >60)
Glucose, Bld: 82 mg/dL (ref 70–99)
Potassium: 3.7 mmol/L (ref 3.5–5.1)
Sodium: 138 mmol/L (ref 135–145)
Total Bilirubin: 0.2 mg/dL — ABNORMAL LOW (ref 0.3–1.2)
Total Protein: 6.3 g/dL — ABNORMAL LOW (ref 6.5–8.1)

## 2022-02-15 LAB — CBC
HCT: 42.2 % (ref 36.0–46.0)
Hemoglobin: 12.4 g/dL (ref 12.0–15.0)
MCH: 25.3 pg — ABNORMAL LOW (ref 26.0–34.0)
MCHC: 29.4 g/dL — ABNORMAL LOW (ref 30.0–36.0)
MCV: 85.9 fL (ref 80.0–100.0)
Platelets: 431 10*3/uL — ABNORMAL HIGH (ref 150–400)
RBC: 4.91 MIL/uL (ref 3.87–5.11)
RDW: 13.9 % (ref 11.5–15.5)
WBC: 6.5 10*3/uL (ref 4.0–10.5)
nRBC: 0 % (ref 0.0–0.2)

## 2022-02-15 MED ORDER — SENNOSIDES-DOCUSATE SODIUM 8.6-50 MG PO TABS
1.0000 | ORAL_TABLET | Freq: Two times a day (BID) | ORAL | Status: DC
  Administered 2022-02-15 – 2022-02-16 (×3): 1 via ORAL
  Filled 2022-02-15 (×3): qty 1

## 2022-02-15 MED ORDER — DICLOFENAC EPOLAMINE 1.3 % EX PTCH
1.0000 | MEDICATED_PATCH | Freq: Two times a day (BID) | CUTANEOUS | Status: DC
  Administered 2022-02-15 (×2): 1 via TRANSDERMAL
  Filled 2022-02-15 (×4): qty 1

## 2022-02-15 MED ORDER — FLEET ENEMA 7-19 GM/118ML RE ENEM
1.0000 | ENEMA | Freq: Once | RECTAL | Status: AC
  Administered 2022-02-15: 1 via RECTAL
  Filled 2022-02-15: qty 1

## 2022-02-15 MED ORDER — AMOXICILLIN-POT CLAVULANATE 875-125 MG PO TABS
1.0000 | ORAL_TABLET | Freq: Two times a day (BID) | ORAL | Status: DC
  Administered 2022-02-16: 1 via ORAL
  Filled 2022-02-15: qty 1

## 2022-02-15 NOTE — Progress Notes (Signed)
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  HTN Underlying chronic thrombocytosis (JAK2 genotype normal) Prior GI bleed (history hemorrhoids,) work-up 08/2021 on admission-felt to be diverticular-transfused 4 units at that time\-colonoscopy--December 2020 = tubular adenoma prior remote stercoral colitis in addition--- Carries a history of erosive focal pill induced esophagitis 05/23/2019--- has been followed [facility] GI Dr. Driscilla Grammes in the past Prior CVA 05/01/2019 = acute pontine infarct at the time status post CIR study  Presented to ED 02/14/2022 abdominal pain no BM X1 week--follow-up of fecal impaction-noted bleeding from anal fissure-smog enema was given CT abdomen pelvis = 5 cm left staghorn calculus large stool in rectum with 13 cm dilatation on possible stercoral colitis Dr.'s outpatient recommended large volume MiraLAX daily MiraLAX for the rest of her life and graduation of diet  Hospital-Problem based course  Impaction/stercoral colitis Appreciate GI/GEN surge input  intolerant of liquid cathartics-(gets nausea)-senna twice daily, ineffective? Then trial to [place] seems to have stool masses in her lower abdomen-2 view ABD XR a.m. IV antibiotics-->Augmentin twice daily Saline 75 cc/H For abdominal pain avoid opiates-Flector patch, Tylenol Staghorn calculus-appreciate urology input Complete antibiotics as above-outpatient coordination removal of the same COPD stage II-III Gold 2 to 3 L oxygen baseline Continue oxygen Inhalers as per prior Complicated GI bleed history-diverticular disease, hemorrhoids, pill induced esophagitis as per narrative Avoid heparinoid's, SCDs Insomnia, depression Continue Cymbalta 60 a.m., patient on Advil PM which will be discontinued and patient should be counseled about  the same as well as ASA 81 mg and discontinuation of both   DVT prophylaxis: SCD Code Status: Full Family Communication: Called and discussed with patient's Sister Lillia Pauls [phone] but did not reach her or her voicemail Disposition:  Status is: Inpatient Remains inpatient appropriate because: need med work up    Consultants:  Gi Gen surg uro  Procedures:  xr  Antimicrobials:     Subjective: Awake some abd pain No cp fever  No n/v sob  Objective: Vitals:   02/14/22 1444 02/14/22 1654 02/14/22 2111 02/15/22 0550  BP: (!) 103/51 119/77 98/60 122/68  Pulse:  95 86 75  Resp:  12 16 16   Temp:  (!) 96.9 F (36.1 C) 97.8 F (36.6 C)   TempSrc:  Axillary Oral   SpO2:  99% 98% 100%  Weight:  38 kg    Height:  5' (1.524 m)      Intake/Output Summary (Last 24 hours) at 02/15/2022 1209 Last data filed at 02/15/2022 0930 Gross per 24 hour  Intake 1497.48 ml  Output 200 ml  Net 1297.48 ml   Filed Weights   02/14/22 0458 02/14/22 1654  Weight: 41.3 kg 38 kg    Examination:  Eomi ncat no focal defciit Kyphotic and looks significantly smaller than her picture with bitemporal wasting, supraclavicular wasting S1-S2 no murmur Abdomen slightly tender lower quadrant --feel hard areas in her lower quadrant cannot appreciate HSM Neuro intact power 5/5 sensory intact grossly-reflexes deferred  Data Reviewed: personally reviewed   CBC    Component Value Date/Time   WBC 6.5 02/15/2022 0432   RBC 4.91 02/15/2022 0432   HGB 12.4 02/15/2022 0432   HCT 42.2 02/15/2022 0432   PLT 431 (H) 02/15/2022 0432   MCV 85.9 02/15/2022 0432   MCH 25.3 (L) 02/15/2022 0432   MCHC  29.4 (L) 02/15/2022 0432   RDW 13.9 02/15/2022 0432   LYMPHSABS 1.2 02/14/2022 0709   MONOABS 0.5 02/14/2022 0709   EOSABS 0.1 02/14/2022 0709   BASOSABS 0.1 02/14/2022 0709   CMP Latest Ref Rng & Units 02/15/2022 02/14/2022 09/11/2021  Glucose 70 - 99 mg/dL 82 104(H) 95  BUN 8 - 23 mg/dL 13 16 14    Creatinine 0.44 - 1.00 mg/dL 0.75 0.73 0.79  Sodium 135 - 145 mmol/L 138 138 137  Potassium 3.5 - 5.1 mmol/L 3.7 3.9 3.9  Chloride 98 - 111 mmol/L 103 99 103  CO2 22 - 32 mmol/L 30 30 26   Calcium 8.9 - 10.3 mg/dL 8.6(L) 9.1 8.7(L)  Total Protein 6.5 - 8.1 g/dL 6.3(L) 7.7 -  Total Bilirubin 0.3 - 1.2 mg/dL 0.2(L) 0.3 -  Alkaline Phos 38 - 126 U/L 80 97 -  AST 15 - 41 U/L 11(L) 14(L) -  ALT 0 - 44 U/L 7 8 -     Radiology Studies: CT ABDOMEN PELVIS W CONTRAST  Result Date: 02/14/2022 CLINICAL DATA:  86 year old female with acute abdominal pain. EXAM: CT ABDOMEN AND PELVIS WITH CONTRAST TECHNIQUE: Multidetector CT imaging of the abdomen and pelvis was performed using the standard protocol following bolus administration of intravenous contrast. RADIATION DOSE REDUCTION: This exam was performed according to the departmental dose-optimization program which includes automated exposure control, adjustment of the mA and/or kV according to patient size and/or use of iterative reconstruction technique. CONTRAST:  171mL OMNIPAQUE IOHEXOL 300 MG/ML  SOLN COMPARISON:  03/16/2020. GI bleeding study 09/09/2021. FINDINGS: Lower chest: Lung base bronchiectasis. Lung base atelectasis and mild postinflammatory lung nodularity. No consolidation or pleural effusion. Hepatobiliary: Stable, negative. Pancreas: Negative. Spleen: Negative. Adrenals/Urinary Tract: Progressed and now large left renal staghorn calculus, 5 cm all told. The left kidney and renal pelvis appear mildly inflamed, but no delayed nephrogram on that side. Contralateral punctate calculus in the dependent right renal pelvis. No acute right hydronephrosis or inflammation. There is left ureter urothelial thickening. Unremarkable bladder. Stomach/Bowel: Large stool ball in the rectum, up to 13 mm long axis and similar appearance in 2021. Questionable rectal wall thickening and mild inflammation. Numerous superimposed pelvic phleboliths. Nondilated upstream  large bowel. No other large bowel inflammation identified. Diminutive or absent appendix. No dilated small bowel. Small gastric hiatal hernia. Decompressed intra-abdominal stomach, duodenum. No free air or free fluid. Vascular/Lymphatic: Aortoiliac calcified atherosclerosis. Bilateral iliac artery stenosis. Visible major arterial structures remain patent. Portal venous system is patent. No lymphadenopathy identified. Reproductive: Absent uterus. Diminutive or absent ovaries. Other: No pelvic free fluid. Musculoskeletal: Osteopenia. Multilevel previous spinal compression fracture and augmentation. Prior sacroplasty. No acute osseous abnormality identified. IMPRESSION: 1. Progressed since 2021 and now large left renal staghorn calculus - 5 cm. Left kidney and renal pelvis appear inflamed, but no delayed nephrogram to indicate acute obstructive uropathy. Consider Pyonephrosis. 2. Large stool ball in the rectum (13 cm) with fecal impaction, possible Stercoral Colitis. 3. No other acute or inflammatory process identified in the abdomen or pelvis. 4. Advanced Aortic Atherosclerosis (ICD10-I70.0). Bronchiectasis. Chronic previously augmented spinal and sacral fractures. Electronically Signed   By: Genevie Ann M.D.   On: 02/14/2022 08:49     Scheduled Meds:  amLODipine  2.5 mg Oral Daily   [START ON 02/16/2022] amoxicillin-clavulanate  1 tablet Oral Q12H   diclofenac  1 patch Transdermal BID   DULoxetine  60 mg Oral Daily   psyllium  1 packet Oral TID   senna-docusate  1  tablet Oral BID   cyanocobalamin  1,000 mcg Oral Daily   Continuous Infusions:  sodium chloride 75 mL/hr at 02/15/22 0316     LOS: 1 day   Time spent: [place], MD [facility] To contact the attending provider between 7A-7P or the covering provider during after hours 7P-7A, please log into the web site [email or web address] and access using universal [place] password for that web site. If you do not have the password,  please call the hospital operator.  02/15/2022, 12:09 PM

## 2022-02-15 NOTE — Evaluation (Signed)
Physical Therapy Evaluation Patient Details Name: [name] MRN: [id number] DOB: November 28, 1936 Today's Date: 02/15/2022  History of Present Illness  Pt admitted from home 2* constipation and with hx of COPD on 3L home O2, osteoporosis, L-spine compression fx, and CVA - resisdual L side weakness  Clinical Impression  Pt admitted as above and presenting with functional mobility limitations 2* generalized weakness (L weaker vs R following CVA), limited endurance, balance deficits and elevated anxiety with fear of falling.  This date pt requiring increased time and encouragement but up to sit at EOB and able to balance IND prior to assist to standing and able to walk limited distance in room.  Pt states she has been considering re-starting home PT to regain some IND and would definitely benefit from same.  Pt states she plans DC home with son and will not consider SNF level rehab.     Recommendations for follow up therapy are one component of a multi-disciplinary discharge planning process, led by the attending physician.  Recommendations may be updated based on patient status, additional functional criteria and insurance authorization.  Follow Up Recommendations Home health PT    Assistance Recommended at Discharge Frequent or constant Supervision/Assistance  Patient can return home with the following  A lot of help with walking and/or transfers;A lot of help with bathing/dressing/bathroom;Assist for transportation;Help with stairs or ramp for entrance;Assistance with cooking/housework    Equipment Recommendations None recommended by PT  Recommendations for Other Services  OT consult    Functional Status Assessment Patient has had a recent decline in their functional status and demonstrates the ability to make significant improvements in function in a reasonable and predictable amount of time.     Precautions / Restrictions Precautions Precautions: Fall Restrictions Weight Bearing  Restrictions: No      Mobility  Bed Mobility Overal bed mobility: Needs Assistance Bed Mobility: Supine to Sit, Sit to Supine     Supine to sit: Mod assist Sit to supine: Mod assist   General bed mobility comments: Increased time with cues for sequence and assist to roll to side, bring legs over EOB and bring trunk to upright.  Assist to manage LEs back onto bed and to control trunk on return    Transfers Overall transfer level: Needs assistance Equipment used: Rolling walker (2 wheels) Transfers: Sit to/from Stand Sit to Stand: Min assist, +2 physical assistance, +2 safety/equipment, From elevated surface           General transfer comment: cues for use of UEs to self assist; physical assist to bring wt up and fwd and to balance in standing    Ambulation/Gait Ambulation/Gait assistance: Min assist, +2 physical assistance, +2 safety/equipment Gait Distance (Feet): 5 Feet Assistive device: Rolling walker (2 wheels) Gait Pattern/deviations: Step-to pattern, Decreased step length - right, Decreased step length - left, Shuffle, Trunk flexed Gait velocity: decr     General Gait Details: Pt side-stepped up side of bed and took 3 steps fwd and back;  Cues for posture and position from RW; assist for balance/support and RW management  Stairs            Wheelchair Mobility    Modified Rankin (Stroke Patients Only)       Balance Overall balance assessment: Needs assistance Sitting-balance support: No upper extremity supported, Feet unsupported Sitting balance-Leahy Scale: Fair     Standing balance support: Bilateral upper extremity supported Standing balance-Leahy Scale: Poor  Pertinent Vitals/Pain Pain Assessment Pain Assessment: No/denies pain    Home Living Family/patient expects to be discharged to:: Private residence Living Arrangements: Children Available Help at Discharge: Family;Friend(s) Type of Home:  House Home Access: Stairs to enter Entrance Stairs-Rails: None Entrance Stairs-Number of Steps: 3   Home Layout: One level Home Equipment: Conservation officer, nature (2 wheels);Rollator (4 wheels);BSC/3in1;Shower seat;Grab bars - toilet;Grab bars - tub/shower;Hospital bed Additional Comments: Pt states 2 people carry her down steps when she leaves home. Son has moved in and is there 24/7 to assist    Prior Function Prior Level of Function : Needs assist             Mobility Comments: Pt states very limited mobilization last several weeks; Typically using RW in home for short distances and WC out of home       Hand Dominance   Dominant Hand: Right    Extremity/Trunk Assessment   Upper Extremity Assessment Upper Extremity Assessment: Generalized weakness;LUE deficits/detail LUE Deficits / Details: L U/E weaker vs R and pt reports can not feel as well as R UE but unable to elaborate LUE Sensation: decreased light touch    Lower Extremity Assessment Lower Extremity Assessment: Generalized weakness;LLE deficits/detail LLE Deficits / Details: L LE weaker vs R and pt reports can not feel as well but can not elaborate beyond LLE Sensation: decreased light touch    Cervical / Trunk Assessment Cervical / Trunk Assessment: Kyphotic  Communication   Communication: No difficulties  Cognition Arousal/Alertness: Awake/alert Behavior During Therapy: WFL for tasks assessed/performed Overall Cognitive Status: Within Functional Limits for tasks assessed                                          General Comments      Exercises     Assessment/Plan    PT Assessment Patient needs continued PT services  PT Problem List Decreased strength;Decreased activity tolerance;Decreased mobility;Decreased balance;Decreased knowledge of use of DME;Decreased safety awareness       PT Treatment Interventions DME instruction;Gait training;Functional mobility training;Therapeutic  activities;Therapeutic exercise;Patient/family education;Balance training    PT Goals (Current goals can be found in the Care Plan section)  Acute Rehab PT Goals Patient Stated Goal: Regain  some IND PT Goal Formulation: With patient Time For Goal Achievement: 03/01/22 Potential to Achieve Goals: Fair    Frequency Min 3X/week     Co-evaluation               AM-PAC PT "6 Clicks" Mobility  Outcome Measure Help needed turning from your back to your side while in a flat bed without using bedrails?: A Lot Help needed moving from lying on your back to sitting on the side of a flat bed without using bedrails?: A Lot Help needed moving to and from a bed to a chair (including a wheelchair)?: A Lot Help needed standing up from a chair using your arms (e.g., wheelchair or bedside chair)?: A Lot Help needed to walk in hospital room?: Total Help needed climbing 3-5 steps with a railing? : Total 6 Click Score: 10    End of Session Equipment Utilized During Treatment: Gait belt;Oxygen Activity Tolerance: Patient tolerated treatment well;Patient limited by fatigue Patient left: in bed;with call bell/phone within reach;with bed alarm set Nurse Communication: Mobility status PT Visit Diagnosis: Muscle weakness (generalized) (M62.81);History of falling (Z91.81);Difficulty in walking, not elsewhere classified (R26.2)  Time: 1415-1500 PT Time Calculation (min) (ACUTE ONLY): 45 min   Charges:   PT Evaluation $PT Eval Low Complexity: 1 Low PT Treatments $Therapeutic Activity: 23-37 mins        Debe Coder PT Acute Rehabilitation Services Pager [phone] Office [phone]   [facility] 02/15/2022, 3:34 PM

## 2022-02-15 NOTE — Progress Notes (Signed)
Stercoral colitis  Subjective: Pt states she had several BM's yesterday but none today. Denies abd pain or rectal bleeding  Objective: Vital signs in last 24 hours: Temp:  [96.9 F (36.1 C)-97.8 F (36.6 C)] 97.8 F (36.6 C) (02/18 2111) Pulse Rate:  [75-111] 75 (02/19 0550) Resp:  [12-18] 16 (02/19 0550) BP: (98-135)/(51-87) 122/68 (02/19 0550) SpO2:  [96 %-100 %] 100 % (02/19 0550) Weight:  [38 kg] 38 kg (02/18 1654) Last BM Date : 02/14/22  Intake/Output from previous day: 02/18 0701 - 02/19 0700 In: 1576.2 [P.O.:420; I.V.:857.5; IV Piggyback:298.7] Out: 200 [Urine:200] Intake/Output this shift: No intake/output data recorded.  General appearance: alert and cooperative GI: soft, nondistended  Lab Results:  Results for orders placed or performed during the hospital encounter of 02/14/22 (from the past 24 hour(s))  Urinalysis, Routine w reflex microscopic     Status: Abnormal   Collection Time: 02/14/22 10:03 AM  Result Value Ref Range   Color, Urine YELLOW YELLOW   APPearance CLOUDY (A) CLEAR   Specific Gravity, Urine 1.042 (H) 1.005 - 1.030   pH 7.0 5.0 - 8.0   Glucose, UA NEGATIVE NEGATIVE mg/dL   Hgb urine dipstick MODERATE (A) NEGATIVE   Bilirubin Urine NEGATIVE NEGATIVE   Ketones, ur NEGATIVE NEGATIVE mg/dL   Protein, ur 100 (A) NEGATIVE mg/dL   Nitrite POSITIVE (A) NEGATIVE   Leukocytes,Ua LARGE (A) NEGATIVE   RBC / HPF >50 (H) 0 - 5 RBC/hpf   WBC, UA >50 (H) 0 - 5 WBC/hpf   Bacteria, UA FEW (A) NONE SEEN   Squamous Epithelial / LPF 0-5 0 - 5   WBC Clumps PRESENT    Mucus PRESENT   Resp Panel by RT-PCR (Flu A&B, Covid) Nasopharyngeal Swab     Status: None   Collection Time: 02/14/22 10:03 AM   Specimen: Nasopharyngeal Swab; Nasopharyngeal(NP) swabs in vial transport medium  Result Value Ref Range   SARS Coronavirus 2 by RT PCR NEGATIVE NEGATIVE   Influenza A by PCR NEGATIVE NEGATIVE   Influenza B by PCR NEGATIVE NEGATIVE  Comprehensive metabolic panel      Status: Abnormal   Collection Time: 02/15/22  4:32 AM  Result Value Ref Range   Sodium 138 135 - 145 mmol/L   Potassium 3.7 3.5 - 5.1 mmol/L   Chloride 103 98 - 111 mmol/L   CO2 30 22 - 32 mmol/L   Glucose, Bld 82 70 - 99 mg/dL   BUN 13 8 - 23 mg/dL   Creatinine, Ser 0.75 0.44 - 1.00 mg/dL   Calcium 8.6 (L) 8.9 - 10.3 mg/dL   Total Protein 6.3 (L) 6.5 - 8.1 g/dL   Albumin 2.5 (L) 3.5 - 5.0 g/dL   AST 11 (L) 15 - 41 U/L   ALT 7 0 - 44 U/L   Alkaline Phosphatase 80 38 - 126 U/L   Total Bilirubin 0.2 (L) 0.3 - 1.2 mg/dL   GFR, Estimated >60 >60 mL/min   Anion gap 5 5 - 15  CBC     Status: Abnormal   Collection Time: 02/15/22  4:32 AM  Result Value Ref Range   WBC 6.5 4.0 - 10.5 K/uL   RBC 4.91 3.87 - 5.11 MIL/uL   Hemoglobin 12.4 12.0 - 15.0 g/dL   HCT 42.2 36.0 - 46.0 %   MCV 85.9 80.0 - 100.0 fL   MCH 25.3 (L) 26.0 - 34.0 pg   MCHC 29.4 (L) 30.0 - 36.0 g/dL   RDW 13.9 11.5 -  15.5 %   Platelets 431 (H) 150 - 400 K/uL   nRBC 0.0 0.0 - 0.2 %     Studies/Results Radiology     MEDS, Scheduled  amLODipine  2.5 mg Oral Daily   DULoxetine  60 mg Oral Daily   polyethylene glycol  17 g Oral Daily   polyethylene glycol powder  1 Container Oral Once   psyllium  1 packet Oral TID   cyanocobalamin  1,000 mcg Oral Daily     Assessment: Stercoral colitis   Plan: Cont bowel regimen indefinitely.  Abx x 5 days    LOS: 1 day    Rosario Adie, MD [facility]  Patient's medical decision making was straightforward (25 mins met or exceeded with patient care and documentation).   02/15/2022 8:17 AM

## 2022-02-16 LAB — COMPREHENSIVE METABOLIC PANEL
ALT: 8 U/L (ref 0–44)
AST: 10 U/L — ABNORMAL LOW (ref 15–41)
Albumin: 2.4 g/dL — ABNORMAL LOW (ref 3.5–5.0)
Alkaline Phosphatase: 72 U/L (ref 38–126)
Anion gap: 5 (ref 5–15)
BUN: 9 mg/dL (ref 8–23)
CO2: 29 mmol/L (ref 22–32)
Calcium: 8.1 mg/dL — ABNORMAL LOW (ref 8.9–10.3)
Chloride: 103 mmol/L (ref 98–111)
Creatinine, Ser: 0.53 mg/dL (ref 0.44–1.00)
GFR, Estimated: >60 mL/min (ref >60)
Glucose, Bld: 79 mg/dL (ref 70–99)
Potassium: 3.1 mmol/L — ABNORMAL LOW (ref 3.5–5.1)
Sodium: 137 mmol/L (ref 135–145)
Total Bilirubin: 0.3 mg/dL (ref 0.3–1.2)
Total Protein: 5.8 g/dL — ABNORMAL LOW (ref 6.5–8.1)

## 2022-02-16 LAB — CBC WITH DIFFERENTIAL/PLATELET
Abs Immature Granulocytes: 0.02 10*3/uL (ref 0.00–0.07)
Basophils Absolute: 0 10*3/uL (ref 0.0–0.1)
Basophils Relative: 1 %
Eosinophils Absolute: 0.1 10*3/uL (ref 0.0–0.5)
Eosinophils Relative: 2 %
HCT: 39.5 % (ref 36.0–46.0)
Hemoglobin: 11.5 g/dL — ABNORMAL LOW (ref 12.0–15.0)
Immature Granulocytes: 0 %
Lymphocytes Relative: 17 %
Lymphs Abs: 1 10*3/uL (ref 0.7–4.0)
MCH: 24.9 pg — ABNORMAL LOW (ref 26.0–34.0)
MCHC: 29.1 g/dL — ABNORMAL LOW (ref 30.0–36.0)
MCV: 85.7 fL (ref 80.0–100.0)
Monocytes Absolute: 0.3 10*3/uL (ref 0.1–1.0)
Monocytes Relative: 6 %
Neutro Abs: 4.3 10*3/uL (ref 1.7–7.7)
Neutrophils Relative %: 74 %
Platelets: 374 10*3/uL (ref 150–400)
RBC: 4.61 MIL/uL (ref 3.87–5.11)
RDW: 13.9 % (ref 11.5–15.5)
WBC: 5.8 10*3/uL (ref 4.0–10.5)
nRBC: 0 % (ref 0.0–0.2)

## 2022-02-16 LAB — URINE CULTURE: Culture: >=100000 — AB

## 2022-02-16 MED ORDER — POTASSIUM CHLORIDE CRYS ER 20 MEQ PO TBCR: 40.0000 meq | EXTENDED_RELEASE_TABLET | Freq: Every day | ORAL | 0 refills | Status: DC

## 2022-02-16 MED ORDER — ACETAMINOPHEN 325 MG PO TABS: 650.0000 mg | ORAL_TABLET | Freq: Four times a day (QID) | ORAL | Status: AC | PRN

## 2022-02-16 MED ORDER — AMOXICILLIN-POT CLAVULANATE 875-125 MG PO TABS: 1.0000 | ORAL_TABLET | Freq: Two times a day (BID) | ORAL | 0 refills | Status: DC

## 2022-02-16 MED ORDER — SENNOSIDES-DOCUSATE SODIUM 8.6-50 MG PO TABS: 1.0000 | ORAL_TABLET | Freq: Two times a day (BID) | ORAL | 3 refills | Status: DC

## 2022-02-16 MED ORDER — POTASSIUM CHLORIDE CRYS ER 20 MEQ PO TBCR
40.0000 meq | EXTENDED_RELEASE_TABLET | Freq: Every day | ORAL | Status: DC
  Administered 2022-02-16: 40 meq via ORAL
  Filled 2022-02-16: qty 2

## 2022-02-16 NOTE — TOC Transition Note (Signed)
Transition of Care [facility]) - CM/SW Discharge Note   Patient Details  Name: [name] MRN: [id number] Date of Birth: 1936-01-16  Transition of Care [facility]) CM/SW Contact:  Lennart Pall, LCSW Phone Number: 02/16/2022, 11:32 AM   Clinical Narrative:     Met with pt today to review dc needs.  Orders placed for HHPT and pt agreeable with no agency preference - referral placed with [facility].  Pt requires PTAR transport home - called at 11:32 and confirmed plan with pt and son. No further TOC needs.  Final next level of care: [place] Barriers to Discharge: Barriers Resolved   Patient Goals and CMS Choice Patient states their goals for this hospitalization and ongoing recovery are:: return home      Discharge Placement                       Discharge Plan and Services                DME Arranged: N/A DME Agency: NA       HH Arranged: PT HH Agency: [facility] [place] Date [place]: 02/16/22 Time [place]: 7829 Representative spoke with at [place]: [place] ([place]) Interventions     Readmission Risk Interventions Readmission Risk Prevention Plan 02/16/2022 06/20/2019  Post Dischage Appt Complete -  Medication Screening Complete -  Transportation Screening Complete Complete  PCP or Specialist Appt within 5-7 Days - Complete  Home Care Screening - Complete  Medication Review (RN CM) - Complete  Some recent data might be hidden

## 2022-02-16 NOTE — Discharge Summary (Signed)
Physician Discharge Summary   Patient: [name] MRN: [id number] DOB: 07-Nov-1936  Admit date:     02/14/2022  Discharge date: 02/16/22  Discharge Physician: Nita Sells   PCP: [name], MD   Recommendations at discharge:   Will need to be on indefinite senna twice daily given several episodes in the past of obstipation constipation and fecal impaction--patient is intolerant to liquid formulation of purgative's Can use enemas if does not stool within 3 to 4 days Will need outpatient coordination of care for staghorn calculus with [facility]-I have [facility] resident/attending with regards to this care coordination that she is going home-she will probably have to call their office Needs screening Chem-12 CBC in the outpatient setting Complete antibiotics as per general surgery  Discharge Diagnoses: Principal Problem:   Stercoral colitis Active Problems:   Hypertension   COPD (chronic obstructive pulmonary disease) ([place])   Chronic respiratory failure ([place])   Depression  Resolved Problems:   * No resolved hospital problems. *   Hospital Course:  86 year old white female home dwelling COPD mMRC stage III-IV on 2 to 3 L oxygen at home.  HTN Underlying chronic thrombocytosis (JAK2 genotype normal) Prior GI bleed (history hemorrhoids,) work-up 08/2021 on admission-felt to be diverticular-transfused 4 units at that time-colonoscopy--December 2020 = tubular adenoma prior remote stercoral colitis in addition--- Carries a history of erosive focal pill induced esophagitis 05/23/2019--- has been followed by [facility] GI Dr. 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 Service: Hospitalist  Impaction/stercoral colitis Appreciate GI/GEN surge input  intolerant of liquid cathartics-(gets nausea)-senna twice daily effective Will needs FLEETs if no stool at home 2 vw AXr neg for large stool burden IV antibiotics-->Augmentin twice daily Saline 75 cc/H For abdominal pain avoid opiates-Flector patch, Tylenol Staghorn calculus-appreciate urology input Complete antibiotics as above-outpatient coordination removal of the same COPD stage II-III Gold 2 to 3 L oxygen baseline Continue oxygen Inhalers as per prior Complicated GI bleed history-diverticular disease, hemorrhoids, pill induced esophagitis as per narrative Avoid heparinoid's Need sASA 81 2/2 prior CVA Insomnia, depression Continue Cymbalta 60 a.m., patient on Advil PM [d/c'd]           Consultants: gen surg, gi Procedures performed: none  Disposition: Home health Diet recommendation:  Cardiac diet  DISCHARGE MEDICATION: Allergies as of 02/16/2022       Reactions   Ambien [zolpidem] Other (See Comments)   Hallucinations and paranoid   Codeine Hives, Nausea And Vomiting        Medication List     STOP taking these medications    Advil PM 200-38 MG Tabs Generic drug: Ibuprofen-diphenhydrAMINE Cit       TAKE these medications    acetaminophen 325 MG tablet Commonly known as: TYLENOL Take 2 tablets (650 mg total) by mouth every 6 (six) hours as needed for mild pain (or Fever >/= 101).   albuterol 108 (90 Base) MCG/ACT inhaler Commonly known as: VENTOLIN HFA Inhale 2 puffs into the lungs every  4 (four) hours as needed for wheezing, cough or shortness of breath.   amLODipine 2.5 MG tablet Commonly known as: NORVASC Take 2.5 mg by mouth daily.    amoxicillin-clavulanate 875-125 MG tablet Commonly known as: AUGMENTIN Take 1 tablet by mouth every 12 (twelve) hours.   aspirin EC 81 MG tablet Take 81 mg by mouth daily. Swallow whole.   cyanocobalamin 1000 MCG tablet Take 1 tablet (1,000 mcg total) by mouth daily.   DULoxetine 60 MG capsule Commonly known as: CYMBALTA Take 60 mg by mouth daily.   Muscle Rub 10-15 % Crea Apply 1 application topically 2 (two) times daily as needed for muscle pain.   potassium chloride SA 20 MEQ tablet Commonly known as: KLOR-CON M Take 2 tablets (40 mEq total) by mouth daily. Start taking on: February 17, 2022   psyllium 95 % Pack Commonly known as: HYDROCIL/METAMUCIL Take 1 packet by mouth 3 (three) times daily.   senna-docusate 8.6-50 MG tablet Commonly known as: Senokot-S Take 1 tablet by mouth 2 (two) times daily.         Discharge Exam: Filed Weights   02/14/22 0458 02/14/22 1654  Weight: 41.3 kg 38 kg   Awake alert kyphotic No distress Cta b no rales rhonchi Abd soft--slight tender over kidney Neuro intact On oxygen   Condition at discharge: fair  The results of significant diagnostics from this hospitalization (including imaging, microbiology, ancillary and laboratory) are listed below for reference.   Imaging Studies: CT ABDOMEN PELVIS W CONTRAST  Result Date: 02/14/2022 CLINICAL DATA:  86 year old female with acute abdominal pain. EXAM: CT ABDOMEN AND PELVIS WITH CONTRAST TECHNIQUE: Multidetector CT imaging of the abdomen and pelvis was performed using the standard protocol following bolus administration of intravenous contrast. RADIATION DOSE REDUCTION: This exam was performed according to the departmental dose-optimization program which includes automated exposure control, adjustment of the mA and/or kV according to patient size and/or use of iterative reconstruction technique. CONTRAST:  175mL OMNIPAQUE IOHEXOL 300 MG/ML  SOLN COMPARISON:  03/16/2020. GI bleeding  study 09/09/2021. FINDINGS: Lower chest: Lung base bronchiectasis. Lung base atelectasis and mild postinflammatory lung nodularity. No consolidation or pleural effusion. Hepatobiliary: Stable, negative. Pancreas: Negative. Spleen: Negative. Adrenals/Urinary Tract: Progressed and now large left renal staghorn calculus, 5 cm all told. The left kidney and renal pelvis appear mildly inflamed, but no delayed nephrogram on that side. Contralateral punctate calculus in the dependent right renal pelvis. No acute right hydronephrosis or inflammation. There is left ureter urothelial thickening. Unremarkable bladder. Stomach/Bowel: Large stool ball in the rectum, up to 13 mm long axis and similar appearance in 2021. Questionable rectal wall thickening and mild inflammation. Numerous superimposed pelvic phleboliths. Nondilated upstream large bowel. No other large bowel inflammation identified. Diminutive or absent appendix. No dilated small bowel. Small gastric hiatal hernia. Decompressed intra-abdominal stomach, duodenum. No free air or free fluid. Vascular/Lymphatic: Aortoiliac calcified atherosclerosis. Bilateral iliac artery stenosis. Visible major arterial structures remain patent. Portal venous system is patent. No lymphadenopathy identified. Reproductive: Absent uterus. Diminutive or absent ovaries. Other: No pelvic free fluid. Musculoskeletal: Osteopenia. Multilevel previous spinal compression fracture and augmentation. Prior sacroplasty. No acute osseous abnormality identified. IMPRESSION: 1. Progressed since 2021 and now large left renal staghorn calculus - 5 cm. Left kidney and renal pelvis appear inflamed, but no delayed nephrogram to indicate acute obstructive uropathy. Consider Pyonephrosis. 2. Large stool ball in the rectum (13 cm) with fecal impaction, possible Stercoral Colitis. 3. No other acute or inflammatory process identified in the  abdomen or pelvis. 4. Advanced Aortic Atherosclerosis (ICD10-I70.0).  Bronchiectasis. Chronic previously augmented spinal and sacral fractures. Electronically Signed   By: Genevie Ann M.D.   On: 02/14/2022 08:49   DG Abd 2 Views  Result Date: 02/15/2022 CLINICAL DATA:  Constipation.  Abdominal pain. EXAM: ABDOMEN - 2 VIEW COMPARISON:  Abdomen CT 02/14/2022 FINDINGS: Upright film shows no evidence for intraperitoneal free air. Supine film shows no gaseous small bowel dilatation to suggest obstruction. Gas is seen scattered along the course of the nondilated colon. Large left renal stone again noted. Contrast material in the bladder is compatible with yesterday's CT scan. Multilevel augmentation noted in the thoracolumbar spine and sacrum. IMPRESSION: 1. No evidence for bowel obstruction or perforation. No substantial stool volume. 2. Large left renal stone. Electronically Signed   By: Misty Stanley M.D.   On: 02/15/2022 16:08    Microbiology: Results for orders placed or performed during the hospital encounter of 02/14/22  Urine Culture     Status: Abnormal   Collection Time: 02/14/22 10:03 AM   Specimen: In/Out Cath Urine  Result Value Ref Range Status   Specimen Description   Final    IN/OUT CATH URINE Performed at [facility] [address] [place], [place] [address]    Special Requests   Final    NONE Performed at [facility], [place] [address], [place], [place] [address]    Culture >=100,000 COLONIES/mL PROTEUS MIRABILIS (A)  Final   Report Status 02/16/2022 FINAL  Final   Organism ID, Bacteria PROTEUS MIRABILIS (A)  Final      Susceptibility   Proteus mirabilis - MIC*    AMPICILLIN <=2 SENSITIVE Sensitive     CEFAZOLIN <=4 SENSITIVE Sensitive     CEFEPIME <=0.12 SENSITIVE Sensitive     CEFTRIAXONE <=0.25 SENSITIVE Sensitive     CIPROFLOXACIN <=0.25 SENSITIVE Sensitive     GENTAMICIN <=1 SENSITIVE Sensitive     IMIPENEM 2 SENSITIVE Sensitive     NITROFURANTOIN 128 RESISTANT Resistant     TRIMETH/SULFA <=20  SENSITIVE Sensitive     AMPICILLIN/SULBACTAM <=2 SENSITIVE Sensitive     PIP/TAZO <=4 SENSITIVE Sensitive     * >=100,000 COLONIES/mL PROTEUS MIRABILIS  Resp Panel by RT-PCR (Flu A&B, Covid) Nasopharyngeal Swab     Status: None   Collection Time: 02/14/22 10:03 AM   Specimen: Nasopharyngeal Swab; Nasopharyngeal(NP) swabs in vial transport medium  Result Value Ref Range Status   SARS Coronavirus 2 by RT PCR NEGATIVE NEGATIVE Final    Comment: (NOTE) SARS-CoV-2 target nucleic acids are NOT DETECTED.  The SARS-CoV-2 RNA is generally detectable in upper respiratory specimens during the acute phase of infection. The lowest concentration of SARS-CoV-2 viral copies this assay can detect is 138 copies/mL. A negative result does not preclude SARS-Cov-2 infection and should not be used as the sole basis for treatment or other patient management decisions. A negative result may occur with  improper specimen collection/handling, submission of specimen other than nasopharyngeal swab, presence of viral mutation(s) within the areas targeted by this assay, and inadequate number of viral copies(<138 copies/mL). A negative result must be combined with clinical observations, patient history, and epidemiological information. The expected result is Negative.  Fact Sheet for Patients:  EntrepreneurPulse.com.au  Fact Sheet for Healthcare Providers:  IncredibleEmployment.be  This test is no t yet approved or cleared by the [place] FDA and  has been authorized for detection and/or diagnosis of SARS-CoV-2 by FDA under an Emergency Use  Authorization (EUA). This EUA will remain  in effect (meaning this test can be used) for the duration of the COVID-19 declaration under Section 564(b)(1) of the Act, 21 U.S.C.section 360bbb-3(b)(1), unless the authorization is terminated  or revoked sooner.       Influenza A by PCR NEGATIVE NEGATIVE Final   Influenza B by PCR  NEGATIVE NEGATIVE Final    Comment: (NOTE) The Xpert Xpress SARS-CoV-2/FLU/RSV plus assay is intended as an aid in the diagnosis of influenza from Nasopharyngeal swab specimens and should not be used as a sole basis for treatment. Nasal washings and aspirates are unacceptable for Xpert Xpress SARS-CoV-2/FLU/RSV testing.  Fact Sheet for Patients: EntrepreneurPulse.com.au  Fact Sheet for Healthcare Providers: IncredibleEmployment.be  This test is not yet approved or cleared by the [place] FDA and has been authorized for detection and/or diagnosis of SARS-CoV-2 by FDA under an Emergency Use Authorization (EUA). This EUA will remain in effect (meaning this test can be used) for the duration of the COVID-19 declaration under Section 564(b)(1) of the Act, 21 U.S.C. section 360bbb-3(b)(1), unless the authorization is terminated or revoked.  Performed at [facility], [place] [address], [place], [place] [address]     Labs: CBC: Recent Labs  Lab 02/14/22 [phone] 02/15/22 0432 02/16/22 0429  WBC 9.4 6.5 5.8  NEUTROABS 7.5  --  4.3  HGB 13.8 12.4 11.5*  HCT 46.8* 42.2 39.5  MCV 83.9 85.9 85.7  PLT 511* 431* 945   Basic Metabolic Panel: Recent Labs  Lab 02/14/22 0709 02/15/22 0432 02/16/22 0429  NA 138 138 137  K 3.9 3.7 3.1*  CL 99 103 103  CO2 30 30 29   GLUCOSE 104* 82 79  BUN 16 13 9   CREATININE 0.73 0.75 0.53  CALCIUM 9.1 8.6* 8.1*   Liver Function Tests: Recent Labs  Lab 02/14/22 0709 02/15/22 0432 02/16/22 0429  AST 14* 11* 10*  ALT 8 7 8   ALKPHOS 97 80 72  BILITOT 0.3 0.2* 0.3  PROT 7.7 6.3* 5.8*  ALBUMIN 3.0* 2.5* 2.4*   CBG: No results for input(s): GLUCAP in the last 168 hours.  Discharge time spent: greater than 30 minutes.  Signed: Nita Sells, MD [facility] 02/16/2022

## 2022-02-16 NOTE — Progress Notes (Signed)
Stercoral colitis  Subjective: Reports having a BM yesterday after the enema.  States a large stool ball came out followed by some softer stool.  Denies significant flatus.  Denies nausea or vomiting.  Objective: Vital signs in last 24 hours: Temp:  [97.5 F (36.4 C)-98.1 F (36.7 C)] 98.1 F (36.7 C) (02/20 0420) Pulse Rate:  [72-82] 72 (02/20 0420) Resp:  [14-16] 16 (02/20 0420) BP: (115-128)/(59-74) 128/59 (02/20 0420) SpO2:  [96 %-100 %] 100 % (02/20 0420) Last BM Date : 02/15/22  Intake/Output from previous day: 02/19 0701 - 02/20 0700 In: 2345 [P.O.:660; I.V.:1685] Out: 250 [Urine:250] Intake/Output this shift: No intake/output data recorded.  General appearance: alert and cooperative GI: soft, nondistended , some firmness just superior to the umbilicus -mobile  Lab Results:  Results for orders placed or performed during the hospital encounter of 02/14/22 (from the past 24 hour(s))  Comprehensive metabolic panel     Status: Abnormal   Collection Time: 02/16/22  4:29 AM  Result Value Ref Range   Sodium 137 135 - 145 mmol/L   Potassium 3.1 (L) 3.5 - 5.1 mmol/L   Chloride 103 98 - 111 mmol/L   CO2 29 22 - 32 mmol/L   Glucose, Bld 79 70 - 99 mg/dL   BUN 9 8 - 23 mg/dL   Creatinine, Ser 0.53 0.44 - 1.00 mg/dL   Calcium 8.1 (L) 8.9 - 10.3 mg/dL   Total Protein 5.8 (L) 6.5 - 8.1 g/dL   Albumin 2.4 (L) 3.5 - 5.0 g/dL   AST 10 (L) 15 - 41 U/L   ALT 8 0 - 44 U/L   Alkaline Phosphatase 72 38 - 126 U/L   Total Bilirubin 0.3 0.3 - 1.2 mg/dL   GFR, Estimated >60 >60 mL/min   Anion gap 5 5 - 15  CBC with Differential/Platelet     Status: Abnormal   Collection Time: 02/16/22  4:29 AM  Result Value Ref Range   WBC 5.8 4.0 - 10.5 K/uL   RBC 4.61 3.87 - 5.11 MIL/uL   Hemoglobin 11.5 (L) 12.0 - 15.0 g/dL   HCT 39.5 36.0 - 46.0 %   MCV 85.7 80.0 - 100.0 fL   MCH 24.9 (L) 26.0 - 34.0 pg   MCHC 29.1 (L) 30.0 - 36.0 g/dL   RDW 13.9 11.5 - 15.5 %   Platelets 374 150 - 400 K/uL    nRBC 0.0 0.0 - 0.2 %   Neutrophils Relative % 74 %   Neutro Abs 4.3 1.7 - 7.7 K/uL   Lymphocytes Relative 17 %   Lymphs Abs 1.0 0.7 - 4.0 K/uL   Monocytes Relative 6 %   Monocytes Absolute 0.3 0.1 - 1.0 K/uL   Eosinophils Relative 2 %   Eosinophils Absolute 0.1 0.0 - 0.5 K/uL   Basophils Relative 1 %   Basophils Absolute 0.0 0.0 - 0.1 K/uL   Immature Granulocytes 0 %   Abs Immature Granulocytes 0.02 0.00 - 0.07 K/uL     Studies/Results Radiology     MEDS, Scheduled  amLODipine  2.5 mg Oral Daily   amoxicillin-clavulanate  1 tablet Oral Q12H   diclofenac  1 patch Transdermal BID   DULoxetine  60 mg Oral Daily   potassium chloride  40 mEq Oral Daily   psyllium  1 packet Oral TID   senna-docusate  1 tablet Oral BID   cyanocobalamin  1,000 mcg Oral Daily     Assessment: Stercoral colitis Plan: Now having bowel function, no  free air on KUB from yesterday, no clinical evidence of perforation or change in abdominal exam.  Cont bowel regimen indefinitely. Abx x 5 days No surgical needs.  General surgery will sign off.  Please call as needed   LOS: 2 days    Obie Dredge, [facility], [place]  Patient's medical decision making was straightforward (25 mins met or exceeded with patient care and documentation).   02/16/2022 8:49 AM

## 2023-05-14 ENCOUNTER — Emergency Department (HOSPITAL_COMMUNITY): Payer: Medicare Other

## 2023-05-14 ENCOUNTER — Inpatient Hospital Stay (HOSPITAL_COMMUNITY)
Admission: EM | Admit: 2023-05-14 | Discharge: 2023-05-22 | DRG: 193 | Disposition: A | Discharge status: Skilled Nursing Facility | Payer: Medicare Other | Attending: Family Medicine | Admitting: Family Medicine

## 2023-05-14 ENCOUNTER — Other Ambulatory Visit: Payer: Self-pay

## 2023-05-14 ENCOUNTER — Encounter (HOSPITAL_COMMUNITY): Payer: Self-pay

## 2023-05-14 DIAGNOSIS — F32A Depression, unspecified: Secondary | ICD-10-CM | POA: Diagnosis present

## 2023-05-14 DIAGNOSIS — N2889 Other specified disorders of kidney and ureter: Secondary | ICD-10-CM | POA: Diagnosis present

## 2023-05-14 DIAGNOSIS — R64 Cachexia: Secondary | ICD-10-CM | POA: Diagnosis present

## 2023-05-14 DIAGNOSIS — Z833 Family history of diabetes mellitus: Secondary | ICD-10-CM

## 2023-05-14 DIAGNOSIS — Z91199 Patient's noncompliance with other medical treatment and regimen due to unspecified reason: Secondary | ICD-10-CM

## 2023-05-14 DIAGNOSIS — N200 Calculus of kidney: Secondary | ICD-10-CM

## 2023-05-14 DIAGNOSIS — K5900 Constipation, unspecified: Secondary | ICD-10-CM | POA: Diagnosis not present

## 2023-05-14 DIAGNOSIS — Z87891 Personal history of nicotine dependence: Secondary | ICD-10-CM | POA: Diagnosis not present

## 2023-05-14 DIAGNOSIS — J13 Pneumonia due to Streptococcus pneumoniae: Secondary | ICD-10-CM | POA: Diagnosis present

## 2023-05-14 DIAGNOSIS — Z90710 Acquired absence of both cervix and uterus: Secondary | ICD-10-CM

## 2023-05-14 DIAGNOSIS — I5043 Acute on chronic combined systolic (congestive) and diastolic (congestive) heart failure: Secondary | ICD-10-CM | POA: Diagnosis present

## 2023-05-14 DIAGNOSIS — Z8701 Personal history of pneumonia (recurrent): Secondary | ICD-10-CM

## 2023-05-14 DIAGNOSIS — I5082 Biventricular heart failure: Secondary | ICD-10-CM | POA: Diagnosis present

## 2023-05-14 DIAGNOSIS — I110 Hypertensive heart disease with heart failure: Secondary | ICD-10-CM | POA: Diagnosis present

## 2023-05-14 DIAGNOSIS — Z7982 Long term (current) use of aspirin: Secondary | ICD-10-CM

## 2023-05-14 DIAGNOSIS — M810 Age-related osteoporosis without current pathological fracture: Secondary | ICD-10-CM | POA: Diagnosis present

## 2023-05-14 DIAGNOSIS — E876 Hypokalemia: Secondary | ICD-10-CM | POA: Diagnosis present

## 2023-05-14 DIAGNOSIS — Z8249 Family history of ischemic heart disease and other diseases of the circulatory system: Secondary | ICD-10-CM | POA: Diagnosis not present

## 2023-05-14 DIAGNOSIS — J9621 Acute and chronic respiratory failure with hypoxia: Secondary | ICD-10-CM | POA: Diagnosis present

## 2023-05-14 DIAGNOSIS — M4854XA Collapsed vertebra, not elsewhere classified, thoracic region, initial encounter for fracture: Secondary | ICD-10-CM | POA: Diagnosis present

## 2023-05-14 DIAGNOSIS — E46 Unspecified protein-calorie malnutrition: Secondary | ICD-10-CM | POA: Diagnosis present

## 2023-05-14 DIAGNOSIS — Z7189 Other specified counseling: Secondary | ICD-10-CM | POA: Diagnosis not present

## 2023-05-14 DIAGNOSIS — G8929 Other chronic pain: Secondary | ICD-10-CM | POA: Diagnosis present

## 2023-05-14 DIAGNOSIS — D509 Iron deficiency anemia, unspecified: Secondary | ICD-10-CM | POA: Diagnosis present

## 2023-05-14 DIAGNOSIS — J449 Chronic obstructive pulmonary disease, unspecified: Secondary | ICD-10-CM | POA: Diagnosis present

## 2023-05-14 DIAGNOSIS — R008 Other abnormalities of heart beat: Secondary | ICD-10-CM | POA: Diagnosis not present

## 2023-05-14 DIAGNOSIS — Z635 Disruption of family by separation and divorce: Secondary | ICD-10-CM

## 2023-05-14 DIAGNOSIS — J440 Chronic obstructive pulmonary disease with acute lower respiratory infection: Secondary | ICD-10-CM | POA: Diagnosis present

## 2023-05-14 DIAGNOSIS — Z1152 Encounter for screening for COVID-19: Secondary | ICD-10-CM

## 2023-05-14 DIAGNOSIS — J189 Pneumonia, unspecified organism: Secondary | ICD-10-CM | POA: Diagnosis not present

## 2023-05-14 DIAGNOSIS — Z681 Body mass index (BMI) 19 or less, adult: Secondary | ICD-10-CM

## 2023-05-14 DIAGNOSIS — I2729 Other secondary pulmonary hypertension: Secondary | ICD-10-CM | POA: Diagnosis present

## 2023-05-14 DIAGNOSIS — G9341 Metabolic encephalopathy: Secondary | ICD-10-CM | POA: Diagnosis present

## 2023-05-14 DIAGNOSIS — N390 Urinary tract infection, site not specified: Secondary | ICD-10-CM

## 2023-05-14 DIAGNOSIS — R0602 Shortness of breath: Secondary | ICD-10-CM | POA: Diagnosis present

## 2023-05-14 DIAGNOSIS — L89151 Pressure ulcer of sacral region, stage 1: Secondary | ICD-10-CM | POA: Diagnosis present

## 2023-05-14 DIAGNOSIS — Z66 Do not resuscitate: Secondary | ICD-10-CM | POA: Diagnosis present

## 2023-05-14 DIAGNOSIS — I10 Essential (primary) hypertension: Secondary | ICD-10-CM | POA: Diagnosis present

## 2023-05-14 DIAGNOSIS — Z9981 Dependence on supplemental oxygen: Secondary | ICD-10-CM

## 2023-05-14 DIAGNOSIS — Z515 Encounter for palliative care: Secondary | ICD-10-CM | POA: Diagnosis not present

## 2023-05-14 DIAGNOSIS — Z79899 Other long term (current) drug therapy: Secondary | ICD-10-CM

## 2023-05-14 DIAGNOSIS — K439 Ventral hernia without obstruction or gangrene: Secondary | ICD-10-CM | POA: Diagnosis present

## 2023-05-14 DIAGNOSIS — Z885 Allergy status to narcotic agent status: Secondary | ICD-10-CM

## 2023-05-14 DIAGNOSIS — R627 Adult failure to thrive: Secondary | ICD-10-CM | POA: Diagnosis present

## 2023-05-14 DIAGNOSIS — Z5320 Procedure and treatment not carried out because of patient's decision for unspecified reasons: Secondary | ICD-10-CM | POA: Diagnosis present

## 2023-05-14 DIAGNOSIS — D649 Anemia, unspecified: Secondary | ICD-10-CM | POA: Diagnosis present

## 2023-05-14 DIAGNOSIS — R1310 Dysphagia, unspecified: Secondary | ICD-10-CM | POA: Diagnosis present

## 2023-05-14 LAB — COMPREHENSIVE METABOLIC PANEL
ALT: 7 U/L (ref 0–44)
AST: 10 U/L — ABNORMAL LOW (ref 15–41)
Albumin: 3.4 g/dL — ABNORMAL LOW (ref 3.5–5.0)
Alkaline Phosphatase: 59 U/L (ref 38–126)
Anion gap: 12 (ref 5–15)
BUN: 26 mg/dL — ABNORMAL HIGH (ref 8–23)
CO2: 28 mmol/L (ref 22–32)
Calcium: 8.5 mg/dL — ABNORMAL LOW (ref 8.9–10.3)
Chloride: 94 mmol/L — ABNORMAL LOW (ref 98–111)
Creatinine, Ser: 0.84 mg/dL (ref 0.44–1.00)
GFR, Estimated: >60 mL/min (ref >60)
Glucose, Bld: 80 mg/dL (ref 70–99)
Potassium: 4 mmol/L (ref 3.5–5.1)
Sodium: 134 mmol/L — ABNORMAL LOW (ref 135–145)
Total Bilirubin: 1.3 mg/dL — ABNORMAL HIGH (ref 0.3–1.2)
Total Protein: 8 g/dL (ref 6.5–8.1)

## 2023-05-14 LAB — CBC WITH DIFFERENTIAL/PLATELET
Abs Immature Granulocytes: 0.05 10*3/uL (ref 0.00–0.07)
Basophils Absolute: 0 10*3/uL (ref 0.0–0.1)
Basophils Relative: 0 %
Eosinophils Absolute: 0 10*3/uL (ref 0.0–0.5)
Eosinophils Relative: 0 %
HCT: 38.5 % (ref 36.0–46.0)
Hemoglobin: 11.9 g/dL — ABNORMAL LOW (ref 12.0–15.0)
Immature Granulocytes: 0 %
Lymphocytes Relative: 3 %
Lymphs Abs: 0.5 10*3/uL — ABNORMAL LOW (ref 0.7–4.0)
MCH: 28.4 pg (ref 26.0–34.0)
MCHC: 30.9 g/dL (ref 30.0–36.0)
MCV: 91.9 fL (ref 80.0–100.0)
Monocytes Absolute: 0.7 10*3/uL (ref 0.1–1.0)
Monocytes Relative: 4 %
Neutro Abs: 13.9 10*3/uL — ABNORMAL HIGH (ref 1.7–7.7)
Neutrophils Relative %: 93 %
Platelets: 342 10*3/uL (ref 150–400)
RBC: 4.19 MIL/uL (ref 3.87–5.11)
RDW: 14 % (ref 11.5–15.5)
WBC: 15.2 10*3/uL — ABNORMAL HIGH (ref 4.0–10.5)
nRBC: 0 % (ref 0.0–0.2)

## 2023-05-14 LAB — URINALYSIS, W/ REFLEX TO CULTURE (INFECTION SUSPECTED)
Bilirubin Urine: NEGATIVE
Glucose, UA: NEGATIVE mg/dL
Ketones, ur: 20 mg/dL — AB
Nitrite: NEGATIVE
Protein, ur: 30 mg/dL — AB
RBC / HPF: >50 RBC/hpf (ref 0–5)
Specific Gravity, Urine: 1.026 (ref 1.005–1.030)
WBC, UA: >50 WBC/hpf (ref 0–5)
pH: 7 (ref 5.0–8.0)

## 2023-05-14 LAB — LIPASE, BLOOD: Lipase: 28 U/L (ref 11–51)

## 2023-05-14 MED ORDER — SORBITOL 70 % SOLN
960.0000 mL | TOPICAL_OIL | ORAL | Status: DC | PRN
  Administered 2023-05-15: 960 mL via RECTAL
  Filled 2023-05-14 (×2): qty 240

## 2023-05-14 MED ORDER — ONDANSETRON HCL 4 MG/2ML IJ SOLN
4.0000 mg | Freq: Four times a day (QID) | INTRAMUSCULAR | Status: DC | PRN
  Administered 2023-05-14 – 2023-05-20 (×4): 4 mg via INTRAVENOUS
  Filled 2023-05-14 (×4): qty 2

## 2023-05-14 MED ORDER — IOHEXOL 300 MG/ML  SOLN
80.0000 mL | Freq: Once | INTRAMUSCULAR | Status: AC | PRN
  Administered 2023-05-14: 75 mL via INTRAVENOUS

## 2023-05-14 MED ORDER — LIDOCAINE 5 % EX PTCH
1.0000 | MEDICATED_PATCH | Freq: Once | CUTANEOUS | Status: AC
  Administered 2023-05-14: 1 via TRANSDERMAL
  Filled 2023-05-14: qty 1

## 2023-05-14 MED ORDER — SODIUM CHLORIDE 0.9 % IV SOLN
500.0000 mg | Freq: Once | INTRAVENOUS | Status: AC
  Administered 2023-05-14: 500 mg via INTRAVENOUS
  Filled 2023-05-14: qty 5

## 2023-05-14 MED ORDER — SODIUM CHLORIDE 0.9 % IV SOLN
1.0000 g | Freq: Once | INTRAVENOUS | Status: AC
  Administered 2023-05-14: 1 g via INTRAVENOUS
  Filled 2023-05-14: qty 10

## 2023-05-14 MED ORDER — MAGNESIUM CITRATE PO SOLN
0.5000 | Freq: Once | ORAL | Status: AC
  Administered 2023-05-14: 0.5 via ORAL
  Filled 2023-05-14 (×2): qty 296

## 2023-05-14 MED ORDER — ONDANSETRON HCL 4 MG PO TABS
4.0000 mg | ORAL_TABLET | Freq: Four times a day (QID) | ORAL | Status: DC | PRN
  Administered 2023-05-21: 4 mg via ORAL
  Filled 2023-05-14: qty 1

## 2023-05-14 MED ORDER — SODIUM CHLORIDE 0.9 % IV BOLUS
1000.0000 mL | Freq: Once | INTRAVENOUS | Status: AC
  Administered 2023-05-14: 1000 mL via INTRAVENOUS

## 2023-05-14 MED ORDER — SODIUM CHLORIDE 0.9 % IV SOLN: INTRAVENOUS | Status: AC

## 2023-05-14 MED ORDER — ACETAMINOPHEN 650 MG RE SUPP: 650.0000 mg | Freq: Four times a day (QID) | RECTAL | Status: DC | PRN

## 2023-05-14 MED ORDER — HYDROMORPHONE HCL 1 MG/ML IJ SOLN
0.5000 mg | Freq: Once | INTRAMUSCULAR | Status: AC
  Administered 2023-05-14: 0.5 mg via INTRAVENOUS
  Filled 2023-05-14: qty 1

## 2023-05-14 MED ORDER — ALBUTEROL SULFATE (2.5 MG/3ML) 0.083% IN NEBU
2.5000 mg | INHALATION_SOLUTION | Freq: Four times a day (QID) | RESPIRATORY_TRACT | Status: DC | PRN
  Administered 2023-05-14 – 2023-05-17 (×2): 2.5 mg via RESPIRATORY_TRACT
  Filled 2023-05-14 (×2): qty 3

## 2023-05-14 MED ORDER — SENNOSIDES-DOCUSATE SODIUM 8.6-50 MG PO TABS: 1.0000 | ORAL_TABLET | Freq: Two times a day (BID) | ORAL | Status: DC

## 2023-05-14 MED ORDER — HYDROMORPHONE HCL 1 MG/ML IJ SOLN
0.5000 mg | INTRAMUSCULAR | Status: DC | PRN
  Administered 2023-05-15 – 2023-05-17 (×3): 0.5 mg via INTRAVENOUS
  Filled 2023-05-14 (×3): qty 0.5

## 2023-05-14 MED ORDER — SODIUM CHLORIDE 0.9 % IV SOLN
500.0000 mg | INTRAVENOUS | Status: AC
  Administered 2023-05-15 – 2023-05-18 (×4): 500 mg via INTRAVENOUS
  Filled 2023-05-14 (×5): qty 5

## 2023-05-14 MED ORDER — DULOXETINE HCL 60 MG PO CPEP
60.0000 mg | ORAL_CAPSULE | Freq: Every day | ORAL | Status: DC
  Administered 2023-05-14 – 2023-05-22 (×6): 60 mg via ORAL
  Filled 2023-05-14 (×9): qty 1

## 2023-05-14 MED ORDER — ACETAMINOPHEN 325 MG PO TABS
650.0000 mg | ORAL_TABLET | Freq: Once | ORAL | Status: AC
  Administered 2023-05-14: 650 mg via ORAL
  Filled 2023-05-14: qty 2

## 2023-05-14 MED ORDER — SODIUM CHLORIDE 0.9 % IV SOLN
1.0000 g | INTRAVENOUS | Status: AC
  Administered 2023-05-15 – 2023-05-18 (×4): 1 g via INTRAVENOUS
  Filled 2023-05-14 (×4): qty 10

## 2023-05-14 MED ORDER — FENTANYL CITRATE PF 50 MCG/ML IJ SOSY
50.0000 ug | PREFILLED_SYRINGE | Freq: Once | INTRAMUSCULAR | Status: AC
  Administered 2023-05-14: 50 ug via INTRAVENOUS
  Filled 2023-05-14: qty 1

## 2023-05-14 MED ORDER — SENNOSIDES-DOCUSATE SODIUM 8.6-50 MG PO TABS
1.0000 | ORAL_TABLET | Freq: Two times a day (BID) | ORAL | Status: DC
  Administered 2023-05-14 – 2023-05-22 (×9): 1 via ORAL
  Filled 2023-05-14 (×10): qty 1

## 2023-05-14 MED ORDER — MELATONIN 3 MG PO TABS
6.0000 mg | ORAL_TABLET | Freq: Every evening | ORAL | Status: AC | PRN
  Administered 2023-05-14 – 2023-05-20 (×2): 6 mg via ORAL
  Filled 2023-05-14 (×2): qty 2

## 2023-05-14 MED ORDER — SODIUM CHLORIDE 0.9 % IV SOLN: INTRAVENOUS | Status: DC

## 2023-05-14 MED ORDER — IPRATROPIUM-ALBUTEROL 0.5-2.5 (3) MG/3ML IN SOLN
3.0000 mL | Freq: Two times a day (BID) | RESPIRATORY_TRACT | Status: DC
  Administered 2023-05-14 – 2023-05-22 (×16): 3 mL via RESPIRATORY_TRACT
  Filled 2023-05-14 (×17): qty 3

## 2023-05-14 MED ORDER — SODIUM CHLORIDE (PF) 0.9 % IJ SOLN
INTRAMUSCULAR | Status: AC
  Filled 2023-05-14: qty 50

## 2023-05-14 MED ORDER — ACETAMINOPHEN 325 MG PO TABS
650.0000 mg | ORAL_TABLET | Freq: Four times a day (QID) | ORAL | Status: DC | PRN
  Administered 2023-05-14 – 2023-05-15 (×3): 650 mg via ORAL
  Filled 2023-05-14 (×3): qty 2

## 2023-05-14 MED ORDER — CLOTRIMAZOLE 1 % EX CREA
TOPICAL_CREAM | Freq: Two times a day (BID) | CUTANEOUS | Status: DC
  Administered 2023-05-20: 1 via TOPICAL
  Filled 2023-05-14: qty 15

## 2023-05-14 MED ORDER — ASPIRIN 81 MG PO TBEC
81.0000 mg | DELAYED_RELEASE_TABLET | Freq: Every day | ORAL | Status: DC
  Administered 2023-05-14 – 2023-05-15 (×2): 81 mg via ORAL
  Filled 2023-05-14 (×4): qty 1

## 2023-05-14 NOTE — H&P (Addendum)
History and Physical    Patient: [name] UJW:[id number] DOB: 03-Oct-1936 DOA: 05/14/2023 DOS: the patient was seen and examined on 05/14/2023 PCP: Annita Brod, MD  Patient coming from: Home  Chief Complaint:  Chief Complaint  Patient presents with   Constipation   HPI: [name] is a 87 y.o. female with medical history significant of osteoarthritis, chronic back pain, history of colitis, lumbar vertebrae compression fracture, sacral fracture, COPD, delirium, hypertension, osteoporosis, history of multiple episodes of pneumonia who presented to the emergency department complaints of constipation associated with LLQ pain.  She stated she has not had a full bowel movement for the past 2 to 3 weeks, but stated last evening she had a small amount bowel movement.  She stated the stools were very solid.  Her appetite is decreased, no nausea, emesis, diarrhea, melena or hematochezia.   No flank pain, dysuria, frequency or hematuria. No fever, chills or night sweats. No sore throat, rhinorrhea, dyspnea, wheezing or hemoptysis.  No chest pain, palpitations, diaphoresis, PND, orthopnea or pitting edema of the lower extremities.  No polyuria, polydipsia, polyphagia or blurred vision.  Lab work: Urinalysis was cloudy with large hemoglobin and large leukocyte esterase.  Ketones of 20 and protein of 30 mg/dL.  Microscopic examination shows more than 50 RBC per hpf, more than 50 WBC per hpf, rare bacteria and positive WBC clumps.  CBC with a white count of 15.2 with 93% neutrophils, hemoglobin 11.9 g/dL platelets 562.  Lipase was normal.  CMP showed his sodium 134, chloride 94 mmol/L.  BUN was 26 total bilirubin 1.3 mg/dL.  Albumin 3.4 g/dL.  The rest of the CMP measurements were unremarkable.  Imaging: Portable 1 view chest radiograph with hazy bibasilar airspace opacities that could represent atelectasis or infection.  CT abdomen/pelvis with contrast showing left lateral abdominal wall hernia just  above the level of the inguinal ring containing a small loop of proximal sigmoid colon without findings of strangulation or obstruction.  There is a large staghorn calculus in the left renal collecting system measuring up to 5.9 cm.  There is also left proximal hide ureter with mild abnormal enhancement of the wall of the left collecting system in the left proximal ureter suggesting ureteritis.  2 nonobstructive right kidney lower pole calculi.  Scattered air-fluid levels in nondilated loops of small bowel, questionable mild ileus.  Mild cardiomegaly with aortic and coronary arthrosclerosis.  Mitral and aortic valve calcifications.  Mild intrahepatic and extrahepatic biliary dilatation.  No visualized choledocholithiasis.  Please see images and full radiology report for further details.   ED course: Initial vital signs were temperature 98.2 F pulse 98, respiration 18, BP 127/63 mmHg O2 sat 95% on room air.  Patient received acetaminophen 650 mg p.o. x 1, azithromycin 500 mg IVPB, ceftriaxone 1 g IVPB, fentanyl 50 mcg IVP, hydromorphone 0.5 mg IVP x 2 and 1000 mL of normal saline bolus.  Review of Systems: As mentioned in the history of present illness. All other systems reviewed and are negative. Past Medical History:  Diagnosis Date   Arthritis    CAP (community acquired pneumonia)    multiple admissions   Chronic back pain 08/22/2018   Colitis 03/19/2014   Compression fx, lumbar spine (HCC)    COPD (chronic obstructive pulmonary disease) (HCC)    Delirium    Hypertension    On home oxygen therapy 06-14-13   continuos 2.5l/m nasally-24/7   Osteoporosis    Sacral fracture (HCC) 2014   Past Surgical  History:  Procedure Laterality Date   ABDOMINAL HYSTERECTOMY     BIOPSY  05/24/2019   Procedure: BIOPSY;  Surgeon: Bernette Redbird, MD;  Location: [facility] ENDOSCOPY;  Service: Endoscopy;;   COLONOSCOPY WITH PROPOFOL Left 03/20/2020   Procedure: COLONOSCOPY WITH PROPOFOL;  Surgeon: Charlott Rakes, MD;   Location: [facility] ENDOSCOPY;  Service: Gastroenterology;  Laterality: Left;  needs ultraslim colonoscope   ESOPHAGOGASTRODUODENOSCOPY N/A 06/15/2013   Procedure: ESOPHAGOGASTRODUODENOSCOPY (EGD);  Surgeon: Shirley Friar, MD;  Location: [facility] ENDOSCOPY;  Service: Endoscopy;  Laterality: N/A;   ESOPHAGOGASTRODUODENOSCOPY (EGD) WITH PROPOFOL N/A 05/24/2019   Procedure: ESOPHAGOGASTRODUODENOSCOPY (EGD) WITH PROPOFOL;  Surgeon: Bernette Redbird, MD;  Location: [facility] ENDOSCOPY;  Service: Endoscopy;  Laterality: N/A;   IR GENERIC HISTORICAL  10/15/2016   IR KYPHO LUMBAR INC FX REDUCE BONE BX UNI/BIL CANNULATION INC/IMAGING 10/15/2016 Julieanne Cotton, MD [facility]-INTERV RAD   POLYPECTOMY  03/20/2020   Procedure: POLYPECTOMY;  Surgeon: Charlott Rakes, MD;  Location: [facility] ENDOSCOPY;  Service: Gastroenterology;;   sacroplasty  06-14-13   05-10-13 IVR CONE for fracture stabilization   Social History:  reports that she quit smoking about 11 years ago. Her smoking use included cigarettes. She has never used smokeless tobacco. She reports that she does not drink alcohol and does not use drugs.  Allergies  Allergen Reactions   Ambien [Zolpidem] Other (See Comments)    Hallucinations and paranoid   Codeine Hives and Nausea And Vomiting    Family History  Problem Relation Age of Onset   Hypertension Sister    Diabetes Mellitus II Sister     Prior to Admission medications   Medication Sig Start Date End Date Taking? Authorizing Provider  acetaminophen (TYLENOL) 325 MG tablet Take 2 tablets (650 mg total) by mouth every 6 (six) hours as needed for mild pain (or Fever >/= 101). 02/16/22   Rhetta Mura, MD  albuterol (VENTOLIN HFA) 108 (90 Base) MCG/ACT inhaler Inhale 2 puffs into the lungs every 4 (four) hours as needed for wheezing, cough or shortness of breath.    [provider]  amLODipine (NORVASC) 2.5 MG tablet Take 2.5 mg by mouth daily.    [provider]  amoxicillin-clavulanate  (AUGMENTIN) 875-125 MG tablet Take 1 tablet by mouth every 12 (twelve) hours. 02/16/22   Rhetta Mura, MD  aspirin EC 81 MG tablet Take 81 mg by mouth daily. Swallow whole. Patient not taking: Reported on 02/14/2022    [provider]  DULoxetine (CYMBALTA) 60 MG capsule Take 60 mg by mouth daily.    [provider]  Menthol-Methyl Salicylate (MUSCLE RUB) 10-15 % CREA Apply 1 application topically 2 (two) times daily as needed for muscle pain. Patient not taking: Reported on 02/14/2022 05/26/19   Love, Evlyn Kanner, PA-C  potassium chloride SA (KLOR-CON M) 20 MEQ tablet Take 2 tablets (40 mEq total) by mouth daily. 02/17/22   Rhetta Mura, MD  psyllium (HYDROCIL/METAMUCIL) 95 % PACK Take 1 packet by mouth 3 (three) times daily. Patient not taking: Reported on 02/14/2022 09/11/21   Lewie Chamber, MD  senna-docusate (SENOKOT-S) 8.6-50 MG tablet Take 1 tablet by mouth 2 (two) times daily. 02/16/22   Rhetta Mura, MD  vitamin B-12 1000 MCG tablet Take 1 tablet (1,000 mcg total) by mouth daily. 09/13/21   Lewie Chamber, MD    Physical Exam: Vitals:   05/14/23 0900 05/14/23 0930 05/14/23 1000 05/14/23 1300  BP: (!) 153/88 (!) 161/71  133/78  Pulse: (!) 126 (!) 117  (!) 129  Resp: 19  20  (!) 22  Temp:   98.3 F (36.8 C) 97.6 F (36.4 C)  TempSrc:   Oral Oral  SpO2: 96% 100%  (!) 88%   Physical Exam Vitals reviewed.  Constitutional:      General: She is awake. She is not in acute distress.    Appearance: She is underweight. She is ill-appearing.  HENT:     Head: Normocephalic.     Nose: No rhinorrhea.     Mouth/Throat:     Mouth: Mucous membranes are dry.  Eyes:     General: No scleral icterus.    Pupils: Pupils are equal, round, and reactive to light.  Neck:     Vascular: No JVD.  Cardiovascular:     Rate and Rhythm: Regular rhythm. Tachycardia present.     Heart sounds: S1 normal and S2 normal.  Pulmonary:     Effort: Pulmonary effort is normal.      Breath sounds: Examination of the right-lower field reveals decreased breath sounds. Examination of the left-lower field reveals decreased breath sounds. Decreased breath sounds and rhonchi present. No wheezing or rales.  Abdominal:     General: Bowel sounds are normal.     Palpations: Abdomen is soft.  Musculoskeletal:     Cervical back: Neck supple.     Right lower leg: No edema.     Left lower leg: No edema.  Skin:    General: Skin is warm and dry.     Findings: Erythema, lesion and rash present.     Comments: Anterior chest wall erythematous rash with hyperkeratosis.  Neurological:     General: No focal deficit present.     Mental Status: She is alert and oriented to person, place, and time.  Psychiatric:        Mood and Affect: Mood normal.        Behavior: Behavior normal. Behavior is cooperative.   Data Reviewed:  There are no new results to review at this time.  Assessment and Plan: Principal Problem:   Acute on chronic respiratory failure with hypoxia (HCC) In the setting of:   CAP (community acquired pneumonia)   COPD (chronic obstructive pulmonary disease) (HCC) Admit to PCU/inpatient. Continue supplemental oxygen. Scheduled and as needed bronchodilators. Continue ceftriaxone 1 g IVPB daily. Continue azithromycin 500 mg IVPB daily. Check strep pneumoniae urinary antigen. Check sputum Gram stain, culture and sensitivity. Follow-up blood culture and sensitivity. Follow-up CBC and chemistry in the morning.  Active Problems:   Ureteritis   Staghorn calculus Urology recommended antibiotics as needed. Urology recommended outpatient follow-up at the office.    Constipation Continue IV hydration. Will treat aggressively with: Magnesium citrate 150 mL p.o. x 1. As needed smog enemas. Begin stool softeners twice daily with meals.    Normocytic anemia Monitor hematocrit and hemoglobin.    Hypertension Blood pressures are soft. As needed antihypertensive.     Depression Continue duloxetine 60 mg p.o. daily.     Advance Care Planning:   Code Status: Full Code   Consults:   Family Communication:   Severity of Illness: The appropriate patient status for this patient is INPATIENT. Inpatient status is judged to be reasonable and necessary in order to provide the required intensity of service to ensure the patient's safety. The patient's presenting symptoms, physical exam findings, and initial radiographic and laboratory data in the context of their chronic comorbidities is felt to place them at high risk for further clinical deterioration. Furthermore, it is not anticipated that the  patient will be medically stable for discharge from the hospital within 2 midnights of admission.   * I certify that at the point of admission it is my clinical judgment that the patient will require inpatient hospital care spanning beyond 2 midnights from the point of admission due to high intensity of service, high risk for further deterioration and high frequency of surveillance required.*  Author: Bobette Mo, MD 05/14/2023 1:40 PM  For on call review [email or web address].  This document was prepared using Dragon voice recognition software and may contain some unintended transcription errors.

## 2023-05-14 NOTE — ED Provider Notes (Signed)
[place] EMERGENCY DEPARTMENT AT [facility] Provider Note  CSN: [id number] Arrival date & time: 05/14/23 4098  Chief Complaint(s) Constipation  HPI [name] is a 87 y.o. female with a past medical history listed below who presents to the emergency department with concern for constipation.  She reports not having any bowel movements in 2 to 3 weeks.  She reports that this evening she did pass a very solid stool but was very difficult to get out.  She is complaining of left lower quadrant abdominal pain that has been ongoing for several days.  Pain is worse with movement and palpation.  She denies any nausea or vomiting.  Stool today was nonbloody.  No urinary symptoms.  Patient reports that she has been taking diarrhea medicine.  The history is provided by the patient.    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   Malnutrition of moderate degree 09/10/2021   Microcytic anemia 09/09/2021   GIB (gastrointestinal bleeding) 04/15/2020   Fecal impaction in rectum (HCC)    Thrombocytosis 03/16/2020   Pressure injury of skin 03/16/2020   Left knee pain 06/18/2019   Upper GI bleed 06/17/2019   Wrist arthritis 05/28/2019   Acute blood loss anemia    Symptomatic anemia    Supplemental oxygen dependent    Right pontine stroke (HCC) 05/02/2019   Stroke (HCC) 05/01/2019   Acute renal failure superimposed on stage 3 chronic kidney disease (HCC) 05/01/2019   Acute ischemic stroke (HCC) 05/01/2019   AKI (acute kidney  injury) (HCC) 08/29/2018   Encephalopathy acute 08/22/2018   Chronic back pain 08/22/2018   Vitamin B12 deficiency 08/19/2018   Dehydration    Tachycardia    Altered mental state 08/18/2018   Closed compression fracture of L1 vertebra (HCC)    Acute cystitis without hematuria    Pain management    Intractable pain    Back pain 10/10/2016   L2 vertebral fracture (HCC) 10/10/2016   Acute encephalopathy 12/25/2015   Drug-induced delirium 12/25/2015   Acute on chronic respiratory failure (HCC) 12/25/2015   CAP (community acquired pneumonia) 12/19/2015   Chronic respiratory failure (HCC) 03/19/2014   Depression 03/19/2014   Aspiration pneumonia (HCC) 03/10/2014   Acute respiratory failure with hypoxia (HCC) 03/10/2014   Sepsis (HCC) 03/10/2014   Hypokalemia 03/10/2014   Hypertension 03/10/2014   COPD (chronic obstructive pulmonary disease) (HCC) 03/10/2014   Tobacco abuse 03/10/2014   Normocytic anemia 06/15/2013   Generalized abdominal pain 06/15/2013   Hematochezia 06/15/2013   Home Medication(s) Prior to Admission medications   Medication Sig Start Date End Date Taking? Authorizing Provider  acetaminophen (TYLENOL) 325 MG tablet Take 2 tablets (650 mg total) by mouth every 6 (six) hours as needed for mild pain (or Fever >/= 101). 02/16/22   Rhetta Mura, MD  albuterol (VENTOLIN HFA) 108 (90 Base) MCG/ACT inhaler Inhale 2 puffs into the lungs every 4 (four) hours as needed for wheezing, cough or shortness of breath.  [provider]  amLODipine (NORVASC) 2.5 MG tablet Take 2.5 mg by mouth daily.    [provider]  amoxicillin-clavulanate (AUGMENTIN) 875-125 MG tablet Take 1 tablet by mouth every 12 (twelve) hours. 02/16/22   Rhetta Mura, MD  aspirin EC 81 MG tablet Take 81 mg by mouth daily. Swallow whole. Patient not taking: Reported on 02/14/2022    [provider]  DULoxetine (CYMBALTA) 60 MG capsule Take 60 mg by mouth daily.     [provider]  Menthol-Methyl Salicylate (MUSCLE RUB) 10-15 % CREA Apply 1 application topically 2 (two) times daily as needed for muscle pain. Patient not taking: Reported on 02/14/2022 05/26/19   Love, Evlyn Kanner, PA-C  potassium chloride SA (KLOR-CON M) 20 MEQ tablet Take 2 tablets (40 mEq total) by mouth daily. 02/17/22   Rhetta Mura, MD  psyllium (HYDROCIL/METAMUCIL) 95 % PACK Take 1 packet by mouth 3 (three) times daily. Patient not taking: Reported on 02/14/2022 09/11/21   Lewie Chamber, MD  senna-docusate (SENOKOT-S) 8.6-50 MG tablet Take 1 tablet by mouth 2 (two) times daily. 02/16/22   Rhetta Mura, MD  vitamin B-12 1000 MCG tablet Take 1 tablet (1,000 mcg total) by mouth daily. 09/13/21   Lewie Chamber, MD                                                                                                                                    Allergies Ambien [zolpidem] and Codeine  Review of Systems Review of Systems As noted in HPI  Physical Exam Vital Signs  I have reviewed the triage vital signs BP 127/63 (BP Location: Left Arm)   Pulse 98   Temp 98.2 F (36.8 C) (Oral)   Resp 18   SpO2 95%   Physical Exam Vitals reviewed.  Constitutional:      General: She is not in acute distress.    Appearance: She is well-developed. She is not diaphoretic.  HENT:     Head: Normocephalic and atraumatic.     Right Ear: External ear normal.     Left Ear: External ear normal.     Nose: Nose normal.  Eyes:     General: No scleral icterus.    Conjunctiva/sclera: Conjunctivae normal.  Neck:     Trachea: Phonation normal.  Cardiovascular:     Rate and Rhythm: Normal rate and regular rhythm.  Pulmonary:     Effort: Pulmonary effort is normal. No respiratory distress.     Breath sounds: No stridor.  Abdominal:     General: There is no distension.     Tenderness: There is abdominal tenderness in the periumbilical area, suprapubic area and left lower quadrant.      Hernia: A hernia is present. Hernia is present in the ventral area.  Musculoskeletal:        General: Normal range of motion.     Cervical back: Normal range of motion.  Neurological:  Mental Status: She is alert and oriented to person, place, and time.  Psychiatric:        Behavior: Behavior normal.     ED Results and Treatments Labs (all labs ordered are listed, but only abnormal results are displayed) Labs Reviewed  COMPREHENSIVE METABOLIC PANEL  LIPASE, BLOOD  CBC WITH DIFFERENTIAL/PLATELET  URINALYSIS, W/ REFLEX TO CULTURE (INFECTION SUSPECTED)                                                                                                                         EKG  EKG Interpretation  Date/Time:    Ventricular Rate:    PR Interval:    QRS Duration:   QT Interval:    QTC Calculation:   R Axis:     Text Interpretation:         Radiology No results found.  Medications Ordered in ED Medications  sodium chloride 0.9 % bolus 1,000 mL (has no administration in time range)    And  0.9 %  sodium chloride infusion (has no administration in time range)  fentaNYL (SUBLIMAZE) injection 50 mcg (has no administration in time range)                                                                                                                                     Procedures Procedures  (including critical care time)  Medical Decision Making / ED Course  Click here for ABCD2, HEART and other calculators  Medical Decision Making   This patient presents to the ED for: Abd pain with possible constipation   Key initial findings: Likely hernia in LLQ, TTP  Co-morbidities/SDOH that complicate the patient evaluation/care: Prior fecal impaction with stercoral colitis  Additional history obtained: CT from Feb 2023: Large stool ball in the rectum (13 cm) with fecal impaction, possible Stercoral Colitis.  Presentation involves an extensive number of treatment  options, and is a complaint that carries with it a high risk of complications and morbidity. The differential diagnosis includes but not limited to:  Fecal impaction, stercoral colitis, incarcerated hernia, bowel obstruction. Will assess for UTI.   Hospitalization considered:  yes  Initial intervention:  IVF  IV fentanyl   Work up Interpretation and Management:   Laboratory Tests ordered listed below with my independent interpretation:     Imaging Studies ordered listed below with my independent interpretation:   ED Course: 7:18  AM Labs and imaging pending at this time. Patient care turned over to oncoming provider. Patient case and results discussed in detail; please see their note for further ED managment.    Final Clinical Impression(s) / ED Diagnoses Final diagnoses:  None           This chart was dictated using voice recognition software.  Despite best efforts to proofread,  errors can occur which can change the documentation meaning.    Nira Conn, MD 05/14/23 [phone]

## 2023-05-14 NOTE — ED Provider Notes (Signed)
Provider Note MRN:  [id number]  Arrival date & time: 05/14/23    ED Course and Medical Decision Making  Assumed care from Dr Eudelia Bunch at shift change.  See note from prior team for complete details, in brief:   Clinical Course as of 05/14/23 1440  Fri May 14, 2023  0711 87 yo f, hx copd on home o2, here w/ LLQ pain + constipation, ?hernia llq, prior fecal impaction w/ stercoracolitis, ct pend [SG]  1002 "2. There is an unusually large staghorn calculus casting throughout most of the left renal collecting systems, infundibula, and calices, and measuring up to 5.9 cm in long axis. There is some excretion of contrast in the left kidney with little in the way of delayed nephrogram compared to the right. There is also left proximal hydroureter extending to the level of the iliac vessel cross over, with mild abnormal enhancement of the wall of the left collecting system and left proximal ureter suggesting ureteritis." [SG]  1002 Creatinine: 0.84 [SG]  1057 Spoke w/ Dr Pete Glatter in regards to calculus and ureteritis, if UA is neg can f/u in office, if +tive treat infection. Similar to size on previous imaging  [SG]  1223 94 points Risk Class IV, 8.2-9.3% mortality. Hospitalization recommended based on risk. [SG]  1307 Requiring 6L [place] [SG]    Clinical Course User Index [SG] Sloan Leiter, DO     Patient history presented with cough, abdominal pain, query constipation. Labs concerning for leukocytosis, she is also tachycardic, increased oxygen requirement, now on 6 L nasal cannula history of COPD and home oxygen use CT imaging concerning for large sacral calculus similar to prior size and ureteritis Chest x-ray concerning for pneumonia, she has cough, body aches, chills, increased difficulty breathing PSI score is class IV, started Rocephin azithromycin Patient does have increased respiratory demand, requiring 6 L nasal cannula, low threshold to start HFNC, she would prefer to avoid bipap  if possible  Recommend admission for pneumonia (class 4 PSI) vs COPD exacerbation, ureteritis/UTI; pt agreeable D/w Dr Robb Matar who accepts pt for admit    .Critical Care  Performed by: Sloan Leiter, DO Authorized by: Sloan Leiter, DO   Critical care provider statement:    Critical care time (minutes):  32   Critical care time was exclusive of:  Separately billable procedures and treating other patients   Critical care was necessary to treat or prevent imminent or life-threatening deterioration of the following conditions:  Respiratory failure   Critical care was time spent personally by me on the following activities:  Development of treatment plan with patient or surrogate, discussions with consultants, evaluation of patient's response to treatment, examination of patient, ordering and review of laboratory studies, ordering and review of radiographic studies, ordering and performing treatments and interventions, pulse oximetry, re-evaluation of patient's condition, review of old charts and obtaining history from patient or surrogate   Care discussed with: admitting provider     Final Clinical Impressions(s) / ED Diagnoses     ICD-10-CM   1. Community acquired pneumonia, unspecified laterality  J18.9     2. Ureteritis  N28.89     3. Staghorn calculus  N20.0     4. Urinary tract infection with hematuria, site unspecified  N39.0    R31.9       ED Discharge Orders     None       Discharge Instructions   None        Sloan Leiter, DO 05/14/23  1440  

## 2023-05-14 NOTE — ED Notes (Signed)
ED TO INPATIENT HANDOFF REPORT  ED Nurse Name and Phone #: Amie Critchley Name/Age/Gender [name] 87 y.o. female Room/Bed: WA23/WA23  Code Status   Code Status: Prior  Home/SNF/Other Nursing Home Patient oriented to: self, place, time, and situation Is this baseline? Yes   Triage Complete: Triage complete  Chief Complaint Acute respiratory failure with hypoxia (HCC) [J96.01]  Triage Note Pt presents to ED for evaluation of constipation with LLQ pain    Allergies Allergies  Allergen Reactions   Ambien [Zolpidem] Other (See Comments)    Hallucinations and paranoid   Codeine Hives and Nausea And Vomiting    Level of Care/Admitting Diagnosis ED Disposition     ED Disposition  Admit   Condition  --   Comment  Hospital Area: [facility] [[id number]]  Level of Care: Progressive [102]  Admit to Progressive based on following criteria: RESPIRATORY PROBLEMS hypoxemic/hypercapnic respiratory failure that is responsive to NIPPV (BiPAP) or High Flow Nasal Cannula (6-80 lpm). Frequent assessment/intervention, no > Q2 hrs < Q4 hrs, to maintain oxygenation and pulmonary hygiene.  Admit to Progressive based on following criteria: MULTISYSTEM THREATS such as stable sepsis, metabolic/electrolyte imbalance with or without encephalopathy that is responding to early treatment.  May admit patient to [facility] or [facility] if equivalent level of care is available:: No  Covid Evaluation: Asymptomatic - no recent exposure (last 10 days) testing not required  Diagnosis: Acute respiratory failure with hypoxia [facility]) [[id number]]  Admitting Physician: Bobette Mo [[id number]]  Attending Physician: Bobette Mo [[id number]]  Certification:: I certify this patient will need inpatient services for at least 2 midnights  Estimated Length of Stay: 2          B Medical/Surgery History Past Medical History:  Diagnosis Date   Arthritis    CAP (community  acquired pneumonia)    multiple admissions   Chronic back pain 08/22/2018   Colitis 03/19/2014   Compression fx, lumbar spine (HCC)    COPD (chronic obstructive pulmonary disease) (HCC)    Delirium    Hypertension    On home oxygen therapy 06-14-13   continuos 2.5l/m nasally-24/7   Osteoporosis    Sacral fracture (HCC) 2014   Past Surgical History:  Procedure Laterality Date   ABDOMINAL HYSTERECTOMY     BIOPSY  05/24/2019   Procedure: BIOPSY;  Surgeon: Bernette Redbird, MD;  Location: [facility] ENDOSCOPY;  Service: Endoscopy;;   COLONOSCOPY WITH PROPOFOL Left 03/20/2020   Procedure: COLONOSCOPY WITH PROPOFOL;  Surgeon: Charlott Rakes, MD;  Location: [facility] ENDOSCOPY;  Service: Gastroenterology;  Laterality: Left;  needs ultraslim colonoscope   ESOPHAGOGASTRODUODENOSCOPY N/A 06/15/2013   Procedure: ESOPHAGOGASTRODUODENOSCOPY (EGD);  Surgeon: Shirley Friar, MD;  Location: [facility]COPY;  Service: Endoscopy;  Laterality: N/A;   ESOPHAGOGASTRODUODENOSCOPY (EGD) WITH PROPOFOL N/A 05/24/2019   Procedure: ESOPHAGOGASTRODUODENOSCOPY (EGD) WITH PROPOFOL;  Surgeon: Bernette Redbird, MD;  Location: [facility] ENDOSCOPY;  Service: Endoscopy;  Laterality: N/A;   IR GENERIC HISTORICAL  10/15/2016   IR KYPHO LUMBAR INC FX REDUCE BONE BX UNI/BIL CANNULATION INC/IMAGING 10/15/2016 Julieanne Cotton, MD [facility]-INTERV RAD   POLYPECTOMY  03/20/2020   Procedure: POLYPECTOMY;  Surgeon: Charlott Rakes, MD;  Location: [facility] ENDOSCOPY;  Service: Gastroenterology;;   sacroplasty  06-14-13   05-10-13 IVR CONE for fracture stabilization     A IV Location/Drains/Wounds Patient Lines/Drains/Airways Status     Active Line/Drains/Airways     Name Placement date Placement time Site Days   Peripheral IV 05/14/23 20 G 1"  Right Antecubital 05/14/23  0654  Antecubital  less than 1   External Urinary Catheter 09/09/21  1857  --  612   External Urinary Catheter 02/14/22  1708  --  454   Pressure Injury 02/14/22 Buttocks Right Stage 2 -   Partial thickness loss of dermis presenting as a shallow open injury with a red, pink wound bed without slough. 1.5x1.5 mm 02/14/22  1749  -- 454            Intake/Output Last 24 hours  Intake/Output Summary (Last 24 hours) at 05/14/2023 1357 Last data filed at 05/14/2023 1312 Gross per 24 hour  Intake 1100 ml  Output --  Net 1100 ml    Labs/Imaging Results for orders placed or performed during the hospital encounter of 05/14/23 (from the past 48 hour(s))  Comprehensive metabolic panel     Status: Abnormal   Collection Time: 05/14/23  6:28 AM  Result Value Ref Range   Sodium 134 (L) 135 - 145 mmol/L   Potassium 4.0 3.5 - 5.1 mmol/L   Chloride 94 (L) 98 - 111 mmol/L   CO2 28 22 - 32 mmol/L   Glucose, Bld 80 70 - 99 mg/dL    Comment: Glucose reference range applies only to samples taken after fasting for at least 8 hours.   BUN 26 (H) 8 - 23 mg/dL   Creatinine, Ser 1.61 0.44 - 1.00 mg/dL   Calcium 8.5 (L) 8.9 - 10.3 mg/dL   Total Protein 8.0 6.5 - 8.1 g/dL   Albumin 3.4 (L) 3.5 - 5.0 g/dL   AST 10 (L) 15 - 41 U/L   ALT 7 0 - 44 U/L   Alkaline Phosphatase 59 38 - 126 U/L   Total Bilirubin 1.3 (H) 0.3 - 1.2 mg/dL   GFR, Estimated >09 >60 mL/min    Comment: (NOTE) Calculated using the CKD-EPI Creatinine Equation (2021)    Anion gap 12 5 - 15    Comment: Performed at [facility], [address], [place], [place] [address]  Lipase, blood     Status: None   Collection Time: 05/14/23  6:28 AM  Result Value Ref Range   Lipase 28 11 - 51 U/L    Comment: Performed at [facility], [address] [place], [place] [address]  CBC with Diff     Status: Abnormal   Collection Time: 05/14/23  6:28 AM  Result Value Ref Range   WBC 15.2 (H) 4.0 - 10.5 K/uL   RBC 4.19 3.87 - 5.11 MIL/uL   Hemoglobin 11.9 (L) 12.0 - 15.0 g/dL   HCT 47.8 29.5 - 62.1 %   MCV 91.9 80.0 - 100.0 fL   MCH 28.4 26.0 - 34.0 pg   MCHC 30.9 30.0 - 36.0 g/dL   RDW 30.8  65.7 - 84.6 %   Platelets 342 150 - 400 K/uL   nRBC 0.0 0.0 - 0.2 %   Neutrophils Relative % 93 %   Neutro Abs 13.9 (H) 1.7 - 7.7 K/uL   Lymphocytes Relative 3 %   Lymphs Abs 0.5 (L) 0.7 - 4.0 K/uL   Monocytes Relative 4 %   Monocytes Absolute 0.7 0.1 - 1.0 K/uL   Eosinophils Relative 0 %   Eosinophils Absolute 0.0 0.0 - 0.5 K/uL   Basophils Relative 0 %   Basophils Absolute 0.0 0.0 - 0.1 K/uL   Immature Granulocytes 0 %   Abs Immature Granulocytes 0.05 0.00 - 0.07 K/uL  Comment: Performed at [facility], [address], [place], [place] [address]  Urinalysis, w/ Reflex to Culture (Infection Suspected) -Urine, Clean Catch     Status: Abnormal   Collection Time: 05/14/23 10:17 AM  Result Value Ref Range   Specimen Source URINE, CLEAN CATCH    Color, Urine YELLOW YELLOW   APPearance CLOUDY (A) CLEAR   Specific Gravity, Urine 1.026 1.005 - 1.030   pH 7.0 5.0 - 8.0   Glucose, UA NEGATIVE NEGATIVE mg/dL   Hgb urine dipstick LARGE (A) NEGATIVE   Bilirubin Urine NEGATIVE NEGATIVE   Ketones, ur 20 (A) NEGATIVE mg/dL   Protein, ur 30 (A) NEGATIVE mg/dL   Nitrite NEGATIVE NEGATIVE   Leukocytes,Ua LARGE (A) NEGATIVE   RBC / HPF >50 0 - 5 RBC/hpf   WBC, UA >50 0 - 5 WBC/hpf    Comment:        Reflex urine culture not performed if WBC <=10, OR if Squamous epithelial cells >5. If Squamous epithelial cells >5 suggest recollection.    Bacteria, UA RARE (A) NONE SEEN   Squamous Epithelial / HPF 6-10 0 - 5 /HPF   WBC Clumps PRESENT     Comment: Performed at [facility], [address], , [place] [address]   DG Chest Portable 1 View  Result Date: 05/14/2023 CLINICAL DATA:  Cough EXAM: PORTABLE CHEST 1 VIEW COMPARISON:  CXR 05/02/20 FINDINGS: No pleural effusion. No pneumothorax. Trace left pleural effusion, decreased from prior exam. Kyphoplasty changes in the mid and lower thoracic spine. Contrast opacify the renal collecting system on the  right. Bibasilar hazy airspace opacities are nonspecific and could represent atelectasis or infection. No radiographically apparent displaced rib fractures. Visualized upper abdomen is unremarkable. IMPRESSION: Hazy bibasilar airspace opacities could represent atelectasis or infection. Electronically Signed   By: Lorenza Cambridge M.D.   On: 05/14/2023 10:48   CT ABDOMEN PELVIS W CONTRAST  Result Date: 05/14/2023 CLINICAL DATA:  Left lower quadrant abdominal pain and constipation. EXAM: CT ABDOMEN AND PELVIS WITH CONTRAST TECHNIQUE: Multidetector CT imaging of the abdomen and pelvis was performed using the standard protocol following bolus administration of intravenous contrast. RADIATION DOSE REDUCTION: This exam was performed according to the departmental dose-optimization program which includes automated exposure control, adjustment of the mA and/or kV according to patient size and/or use of iterative reconstruction technique. CONTRAST:  75mL OMNIPAQUE IOHEXOL 300 MG/ML  SOLN COMPARISON:  02/14/2022 FINDINGS: Lower chest: Mild cardiomegaly. Aortic and coronary atherosclerosis. Mitral and aortic valve calcifications. Dependent atelectasis in both lower lobes. Hepatobiliary: Mild intrahepatic biliary prominence with the common hepatic duct at 1.1 cm in the common bile duct at 0.9 cm, mildly dilated. Previously these measured 0.8 cm and 0.6 cm, respectively. No directly visualized choledocholithiasis. No significant focal parenchymal lesions in the liver observed. Pancreas: Unremarkable Spleen: Unremarkable Adrenals/Urinary Tract: Both adrenal glands appear normal. There continues to be an unusually large staghorn calculus casting throughout most of the left renal collecting systems, infundibula, and calices, and measuring proximally 3.7 by 2.5 by 5.9 cm. Despite the presence of this large stone, there does appear to be some excretion of contrast in the left kidney with little in the way of delayed nephrogram  compared to the right. There is some mild enhancement along the wall of the left collecting system probably from chronic irritation, as well as left proximal hydroureter extending to the level of the iliac vessel cross over, and mild abnormal enhancement of the wall of  the left proximal ureter suggesting ureteritis. The left ureter distal to the iliac vessel cross over is relatively unremarkable. Two nonobstructive right kidney lower pole calculi are present, the larger measuring 0.5 cm in long axis. Urinary bladder unremarkable aside from a suspected left bladder diverticulum. Stomach/Bowel: Just above the level of the inguinal ring on the left, there is a left lateral abdominal wall hernia containing a small loop of proximal sigmoid colon as shown on image 54 series 2 and image 32 series 7. No findings of strangulation or resulting obstruction. Mild sigmoid colon diverticulosis is present. Scattered air-fluid levels are present in nondilated loops of small bowel. No compelling findings of small-bowel obstruction. Vascular/Lymphatic: Atherosclerosis is present, including aortoiliac atherosclerotic disease. Nonocclusive atheromatous plaque proximally in the superior mesenteric artery. There is also some dorsal nonocclusive plaque at the origin of the celiac trunk. Reproductive: Uterus absent.  Adnexa unremarkable. Other: No supplemental non-categorized findings. Musculoskeletal: In addition to the left lateral lower anterior abdominal wall hernia just above the inguinal ring, we demonstrate two fat containing supraumbilical hernias as shown on image 78 series 9. Prior sacroplasty and prior vertebral augmentations at the T11, L1, and L3 levels without substantial change. There continues to be about 6 mm of posterior retropulsion at the L1 fracture site contributing to central narrowing of the thecal sac, and grade 1 retrolisthesis of L3 on L4. Moderate degenerative arthropathy of both hips. The patient's left hand  was incidentally included in the imaging region, demonstrating severe hyper extension at the MCP joint of the left thumb, degenerative arthropathy and possible subluxation at the first carpometacarpal joint, and suspected widening of the scapholunate interval suggesting scapholunate ligament tear. IMPRESSION: 1. Left lateral abdominal wall hernia just above the level of the inguinal ring containing a small loop of proximal sigmoid colon, without findings of strangulation or resulting obstruction. 2. There is an unusually large staghorn calculus casting throughout most of the left renal collecting systems, infundibula, and calices, and measuring up to 5.9 cm in long axis. There is some excretion of contrast in the left kidney with little in the way of delayed nephrogram compared to the right. There is also left proximal hydroureter extending to the level of the iliac vessel cross over, with mild abnormal enhancement of the wall of the left collecting system and left proximal ureter suggesting ureteritis. 3. Two nonobstructive right kidney lower pole calculi. 4. Scattered air-fluid levels in nondilated loops of small bowel, query mild ileus. 5. Mild cardiomegaly with aortic and coronary atherosclerosis. Mitral and aortic valve calcifications. 6. Mild intrahepatic and extrahepatic biliary dilatation, increased from prior. No directly visualized choledocholithiasis. 7. Prior sacroplasty and prior vertebral augmentations at T11, L1, and L3. 8. Moderate degenerative arthropathy of both hips. 9. Two fat containing supraumbilical hernias. 10. Incidental inclusion of the left hand suggests probable scapholunate ligament tear, hyperextension of the MCP joint of the thumb, and degenerative arthropathy and possible subluxation at the first carpometacarpal articulation. Aortic Atherosclerosis (ICD10-I70.0). Electronically Signed   By: Gaylyn Rong M.D.   On: 05/14/2023 09:03    Pending Labs Unresulted Labs (From  admission, onward)     Start     Ordered   05/14/23 1314  Blood culture (routine x 2)  BLOOD CULTURE X 2,   R      05/14/23 1313            Vitals/Pain Today's Vitals   05/14/23 0930 05/14/23 1000 05/14/23 1133 05/14/23 1300  BP: (!) 161/71   133/78  Pulse: (!) 117   (!) 129  Resp: 20   (!) 22  Temp:  98.3 F (36.8 C)  97.6 F (36.4 C)  TempSrc:  Oral  Oral  SpO2: 100%   (!) 88%  PainSc:   0-No pain     Isolation Precautions No active isolations  Medications Medications  sodium chloride 0.9 % bolus 1,000 mL (0 mLs Intravenous Stopped 05/14/23 0904)    And  0.9 %  sodium chloride infusion ( Intravenous New Bag/Given 05/14/23 0830)  azithromycin (ZITHROMAX) 500 mg in sodium chloride 0.9 % 250 mL IVPB (has no administration in time range)  acetaminophen (TYLENOL) tablet 650 mg (has no administration in time range)  HYDROmorphone (DILAUDID) injection 0.5 mg (has no administration in time range)  lidocaine (LIDODERM) 5 % 1 patch (has no administration in time range)  fentaNYL (SUBLIMAZE) injection 50 mcg (50 mcg Intravenous Given 05/14/23 0711)  iohexol (OMNIPAQUE) 300 MG/ML solution 80 mL (75 mLs Intravenous Contrast Given 05/14/23 0806)  HYDROmorphone (DILAUDID) injection 0.5 mg (0.5 mg Intravenous Given 05/14/23 1055)  cefTRIAXone (ROCEPHIN) 1 g in sodium chloride 0.9 % 100 mL IVPB (0 g Intravenous Stopped 05/14/23 1312)    Mobility non-ambulatory     Focused Assessments    R Recommendations: See Admitting Provider Note  Report given to:   Additional Notes: NA

## 2023-05-14 NOTE — ED Notes (Signed)
O2 increased to 5 liters via nasal cannula

## 2023-05-14 NOTE — ED Triage Notes (Signed)
Pt presents to ED for evaluation of constipation with LLQ pain

## 2023-05-15 DIAGNOSIS — J9621 Acute and chronic respiratory failure with hypoxia: Secondary | ICD-10-CM | POA: Diagnosis not present

## 2023-05-15 LAB — COMPREHENSIVE METABOLIC PANEL
ALT: 10 U/L (ref 0–44)
AST: 15 U/L (ref 15–41)
Albumin: 2.6 g/dL — ABNORMAL LOW (ref 3.5–5.0)
Alkaline Phosphatase: 49 U/L (ref 38–126)
Anion gap: 9 (ref 5–15)
BUN: 20 mg/dL (ref 8–23)
CO2: 24 mmol/L (ref 22–32)
Calcium: 7.8 mg/dL — ABNORMAL LOW (ref 8.9–10.3)
Chloride: 103 mmol/L (ref 98–111)
Creatinine, Ser: 0.67 mg/dL (ref 0.44–1.00)
GFR, Estimated: >60 mL/min (ref >60)
Glucose, Bld: 114 mg/dL — ABNORMAL HIGH (ref 70–99)
Potassium: 4 mmol/L (ref 3.5–5.1)
Sodium: 136 mmol/L (ref 135–145)
Total Bilirubin: 0.6 mg/dL (ref 0.3–1.2)
Total Protein: 6.2 g/dL — ABNORMAL LOW (ref 6.5–8.1)

## 2023-05-15 LAB — STREP PNEUMONIAE URINARY ANTIGEN: Strep Pneumo Urinary Antigen: POSITIVE — AB

## 2023-05-15 LAB — CBC
HCT: 32.5 % — ABNORMAL LOW (ref 36.0–46.0)
Hemoglobin: 9.8 g/dL — ABNORMAL LOW (ref 12.0–15.0)
MCH: 28.8 pg (ref 26.0–34.0)
MCHC: 30.2 g/dL (ref 30.0–36.0)
MCV: 95.6 fL (ref 80.0–100.0)
Platelets: 244 10*3/uL (ref 150–400)
RBC: 3.4 MIL/uL — ABNORMAL LOW (ref 3.87–5.11)
RDW: 13.9 % (ref 11.5–15.5)
WBC: 13.1 10*3/uL — ABNORMAL HIGH (ref 4.0–10.5)
nRBC: 0 % (ref 0.0–0.2)

## 2023-05-15 LAB — CULTURE, BLOOD (ROUTINE X 2)

## 2023-05-15 MED ORDER — POLYETHYLENE GLYCOL 3350 17 G PO PACK
17.0000 g | PACK | Freq: Three times a day (TID) | ORAL | Status: DC
  Administered 2023-05-15 – 2023-05-22 (×8): 17 g via ORAL
  Filled 2023-05-15 (×12): qty 1

## 2023-05-15 MED ORDER — POLYETHYLENE GLYCOL 3350 17 G PO PACK: 17.0000 g | PACK | Freq: Two times a day (BID) | ORAL | Status: DC

## 2023-05-15 MED ORDER — THIAMINE HCL 100 MG/ML IJ SOLN
100.0000 mg | Freq: Every day | INTRAMUSCULAR | Status: DC
  Administered 2023-05-15: 100 mg via INTRAVENOUS
  Filled 2023-05-15: qty 2

## 2023-05-15 MED ORDER — DEXTROSE IN LACTATED RINGERS 5 % IV SOLN: INTRAVENOUS | Status: DC

## 2023-05-15 MED ORDER — ORAL CARE MOUTH RINSE: 15.0000 mL | OROMUCOSAL | Status: DC | PRN

## 2023-05-15 NOTE — Evaluation (Addendum)
Clinical/Bedside Swallow Evaluation Patient Details  Name: [name] MRN: [id number] Date of Birth: Sep 02, 1936  Today's Date: 05/15/2023 Time: SLP Start Time (ACUTE ONLY): 1417 SLP Stop Time (ACUTE ONLY): 1432 SLP Time Calculation (min) (ACUTE ONLY): 15 min  Past Medical History:  Past Medical History:  Diagnosis Date   Arthritis    CAP (community acquired pneumonia)    multiple admissions   Chronic back pain 08/22/2018   Colitis 03/19/2014   Compression fx, lumbar spine (HCC)    COPD (chronic obstructive pulmonary disease) (HCC)    Delirium    Hypertension    On home oxygen therapy 06-14-13   continuos 2.5l/m nasally-24/7   Osteoporosis    Sacral fracture (HCC) 2014   Past Surgical History:  Past Surgical History:  Procedure Laterality Date   ABDOMINAL HYSTERECTOMY     BIOPSY  05/24/2019   Procedure: BIOPSY;  Surgeon: Bernette Redbird, MD;  Location: [facility] ENDOSCOPY;  Service: Endoscopy;;   COLONOSCOPY WITH PROPOFOL Left 03/20/2020   Procedure: COLONOSCOPY WITH PROPOFOL;  Surgeon: Charlott Rakes, MD;  Location[facility] ENDOSCOPY;  Service: Gastroenterology;  Laterality: Left;  needs ultraslim colonoscope   ESOPHAGOGASTRODUODENOSCOPY N/A 06/15/2013   Procedure: ESOPHAGOGASTRODUODENOSCOPY (EGD);  Surgeon: Shirley Friar, MD;  Location: [facility] ENDOSCOPY;  Service: Endoscopy;  Laterality: N/A;   ESOPHAGOGASTRODUODENOSCOPY (EGD) WITH PROPOFOL N/A 05/24/2019   Procedure: ESOPHAGOGASTRODUODENOSCOPY (EGD) WITH PROPOFOL;  Surgeon: Bernette Redbird, MD;  Location: [facility] ENDOSCOPY;  Service: Endoscopy;  Laterality: N/A;   IR GENERIC HISTORICAL  10/15/2016   IR KYPHO LUMBAR INC FX REDUCE BONE BX UNI/BIL CANNULATION INC/IMAGING 10/15/2016 Julieanne Cotton, MD [facility] RAD   POLYPECTOMY  03/20/2020   Procedure: POLYPECTOMY;  Surgeon: Charlott Rakes, MD;  Location: [facility] ENDOSCOPY;  Service: Gastroenterology;;   sacroplasty  06-14-13   05-10-13 IVR CONE for fracture stabilization   HPI:  [name] is a 87 y.o. female who presented to the emergency department complaints of constipation associated with LLQ pain.  CXR 5/17: "Hazy bibasilar airspace opacities could represent atelectasis or infection." Pt with medical history significant of osteoarthritis, chronic back pain, history of colitis, lumbar vertebrae compression fracture, sacral fracture, COPD, delirium, hypertension, osteoporosis, history of multiple episodes of pneumonia    Assessment / Plan / Recommendation  Clinical Impression  Pt tolerated all conisistencies trialed, but a concern for prandial aspiration still exists given pt's presentation with pneumonia.  Pt's vocal quality was very dysphonic, harsh, gravelly, with fair to poor phonation and intermittent aphonia.  Pt reports this is not consistent with baseline.  There was no change in vocal quality with POs.  Pt tolerated thin liquid by straw, including serial sips, and puree with no overt s/s of aspiration.  Belching noted intermittently. Pt with small blue/purple black excressence on L anterior lingual surface.  Pt stated she might have bitten her tongue recently, but this was also noted on clinical swallow assessment in 2021.  Recommend further workup if indicated.  Pt declined to eat graham cracker.  She says there are not a lot of solids that she eats. She says she is nervous with solid food since her son died on a camping trip while eating a saugsage biscuit.  She does endorse eating chicken breast if it's tender though.  Pt has had multiple pneumonias.  Recommend further assessment of pharyngeal swallow function with instrumental assessment. Will plan for MBSS as radiology and SLP schedules permit.    Recommend puree diet with thin liquid.   Pt expresses desire to work  with PT/OT on mobility and wishes she had continued [facility] services at an earlier point.  PT/OT consults are already placed.    SLP Visit Diagnosis: Dysphagia, unspecified (R13.10)    Aspiration Risk  Mild  aspiration risk    Diet Recommendation Dysphagia 1 (Puree);Thin liquid   Liquid Administration via: Cup;Straw Medication Administration: Whole meds with liquid Supervision: Staff to assist with self feeding Compensations: Slow rate;Small sips/bites Postural Changes: Seated upright at 90 degrees    Other  Recommendations Oral Care Recommendations: Oral care BID    Recommendations for follow up therapy are one component of a multi-disciplinary discharge planning process, led by the attending physician.  Recommendations may be updated based on patient status, additional functional criteria and insurance authorization.  Follow up Recommendations  (TBD)      Assistance Recommended at Discharge  N/A  Functional Status Assessment  TBD  Frequency and Duration  (TBD)          Prognosis Prognosis for improved oropharyngeal function:  (TBD)      Swallow Study   General Date of Onset: 05/14/23 HPI: [name] is a 87 y.o. female who presented to the emergency department complaints of constipation associated with LLQ pain.  CXR 5/17: "Hazy bibasilar airspace opacities could represent atelectasis or infection." Pt with medical history significant of osteoarthritis, chronic back pain, history of colitis, lumbar vertebrae compression fracture, sacral fracture, COPD, delirium, hypertension, osteoporosis, history of multiple episodes of pneumonia Type of Study: Bedside Swallow Evaluation Previous Swallow Assessment: CSE 03/18/20 with recs for D3/thin Diet Prior to this Study: Clear liquid diet Temperature Spikes Noted: No Respiratory Status: Nasal cannula History of Recent Intubation: No Behavior/Cognition: Alert;Cooperative;Pleasant mood Oral Cavity Assessment: Within Functional Limits Oral Care Completed by SLP: No Patient Positioning: Upright in bed Baseline Vocal Quality: Low vocal intensity;Hoarse;Breathy (Harsh, dysphonic) Volitional Cough: Weak    Oral/Motor/Sensory Function  Overall Oral Motor/Sensory Function: Within functional limits Facial ROM: Within Functional Limits Facial Symmetry: Within Functional Limits Lingual ROM: Within Functional Limits Lingual Symmetry: Within Functional Limits Lingual Strength: Within Functional Limits Velum: Within Functional Limits Mandible: Within Functional Limits   Ice Chips Ice chips: Not tested   Thin Liquid Thin Liquid: Within functional limits Presentation: Straw    Nectar Thick Nectar Thick Liquid: Not tested   Honey Thick Honey Thick Liquid: Not tested   Puree Puree: Within functional limits Presentation: Spoon   Solid     Solid: Not tested      Kerrie Pleasure, MA, CCC-SLP Acute Rehabilitation Services Office: [phone] 05/15/2023,3:46 PM

## 2023-05-15 NOTE — Progress Notes (Signed)
Urine output minimal 130 ml total in canister. Pt had large output around [place] late morning. Bladder scanned 3/3 attempts with 119 noted in bladder at most. Notified Dr. Lowell Guitar

## 2023-05-15 NOTE — Progress Notes (Signed)
PROGRESS NOTE    [name]  ZOX:[id number] DOB: 06-05-1936 DOA: 05/14/2023 PCP: Annita Brod, MD  Chief Complaint  Patient presents with   Constipation    Brief Narrative:   [name] is [name] 87 y.o. female with medical history significant of osteoarthritis, chronic back pain, history of colitis, lumbar vertebrae compression fracture, sacral fracture, COPD, delirium, hypertension, osteoporosis, history of multiple episodes of pneumonia who presented to the emergency department complaints of constipation associated with LLQ pain.  She stated she has not had [name] full bowel movement for the past 2 to 3 weeks, but stated last evening she had [name] small amount bowel movement.  She stated the stools were very solid.  Her appetite is decreased, no nausea, emesis, diarrhea, melena or hematochezia.   No flank pain, dysuria, frequency or hematuria. No fever, chills or night sweats. No sore throat, rhinorrhea, dyspnea, wheezing or hemoptysis.  No chest pain, palpitations, diaphoresis, PND, orthopnea or pitting edema of the lower extremities.  No polyuria, polydipsia, polyphagia or blurred vision.   Lab work: Urinalysis was cloudy with large hemoglobin and large leukocyte esterase.  Ketones of 20 and protein of 30 mg/dL.  Microscopic examination shows more than 50 RBC per hpf, more than 50 WBC per hpf, rare bacteria and positive WBC clumps.  CBC with [name] white count of 15.2 with 93% neutrophils, hemoglobin 11.9 g/dL platelets 811.  Lipase was normal.  CMP showed his sodium 134, chloride 94 mmol/L.  BUN was 26 total bilirubin 1.3 mg/dL.  Albumin 3.4 g/dL.  The rest of the CMP measurements were unremarkable.   Imaging: Portable 1 view chest radiograph with hazy bibasilar airspace opacities that could represent atelectasis or infection.  CT abdomen/pelvis with contrast showing left lateral abdominal wall hernia just above the level of the inguinal ring containing [name] small loop of proximal sigmoid colon without  findings of strangulation or obstruction.  There is [name] large staghorn calculus in the left renal collecting system measuring up to 5.9 cm.  There is also left proximal hide ureter with mild abnormal enhancement of the wall of the left collecting system in the left proximal ureter suggesting ureteritis.  2 nonobstructive right kidney lower pole calculi.  Scattered air-fluid levels in nondilated loops of small bowel, questionable mild ileus.  Mild cardiomegaly with aortic and coronary arthrosclerosis.  Mitral and aortic valve calcifications.  Mild intrahepatic and extrahepatic biliary dilatation.  No visualized choledocholithiasis.  Please see images and full radiology report for further details.   ED course: Initial vital signs were temperature 98.2 F pulse 98, respiration 18, BP 127/63 mmHg O2 sat 95% on room air.  Patient received acetaminophen 650 mg p.o. x 1, azithromycin 500 mg IVPB, ceftriaxone 1 g IVPB, fentanyl 50 mcg IVP, hydromorphone 0.5 mg IVP x 2 and 1000 mL of normal saline bolus.  Assessment & Plan:   Principal Problem:   Acute on chronic respiratory failure with hypoxia (HCC) Active Problems:   Normocytic anemia   Hypertension   COPD (chronic obstructive pulmonary disease) (HCC)   Depression   CAP (community acquired pneumonia)   Constipation   Ureteritis   Staghorn calculus  Acute Metabolic Encephalopathy Seems lethargic this morning, difficult as I don't know her baseline, son notes she's typically alert/oriented.  States only time she's "real confused" is when she first wakes up.  Maybe her presentation this morning to me represents this, will continue to monitor for now.  If it doesn't improve, likely warrants further evaluation.  Son won't be able to get here until tmrw or so. Delirium precautions TSH, B12, folate Treating below   Community Acquired Pneumonia  Acute Hypoxic Respiratory Failure  COPD Currently on 4 L [place]  Hazy bibasilar airspace opacities on CXR Urine  strep, sputum cx Negative covid, flu, RSV Blood cultures pending Ceftriaxone/azithromycin No wheezing, no need for steroids Wean O2 as tolerated  Ureteritis  Staghorn Calculus  On abx as above Follow UA/reflex culture Per admitting provider, urology rec abx prn and outpatient follow up  Constipation Miralax  Hypertension Not on BP meds  Anemia Downtrend, suspect component of dilution B12, folate, iron, ferritin  Left Lateral Abdominal Wall Hernia with [name] loop of proximal sigmoid colon Fat containing Supraumbilical Hernias No findings of strangulation or obstruction Follow outpatient  Air Fluid Levels in Nondilated Loops of Small Bowel ? Mild ileus, follow  Intrahepatic and Extrahepatic Biliary Dilatation Bilirubin, alk phos, and LFT's wnl Follow   Prior Sacroplasty and Prior Vertebral Augmentations Degenerative Arthropathy of Both HIps Scapholunate Ligament Tear, Hyperextension of MCP Joint of thumb and generative arthropathy and possible subluxation of First carpometacarpal articulation Follow outpatient      DVT prophylaxis: SCD Code Status: full Family Communication: none Disposition:   Status is: Inpatient Remains inpatient appropriate because: need for continued inpatient management   Consultants:  none  Procedures:  none  Antimicrobials:  Anti-infectives (From admission, onward)    Start     Dose/Rate Route Frequency Ordered Stop   05/15/23 1600  azithromycin (ZITHROMAX) 500 mg in sodium chloride 0.9 % 250 mL IVPB        500 mg 250 mL/hr over 60 Minutes Intravenous Every 24 hours 05/14/23 1453 05/20/23 1559   05/15/23 1100  cefTRIAXone (ROCEPHIN) 1 g in sodium chloride 0.9 % 100 mL IVPB        1 g 200 mL/hr over 30 Minutes Intravenous Every 24 hours 05/14/23 1453 05/20/23 1059   05/14/23 1315  azithromycin (ZITHROMAX) 500 mg in sodium chloride 0.9 % 250 mL IVPB        500 mg 250 mL/hr over 60 Minutes Intravenous  Once 05/14/23 1313 05/14/23  1722   05/14/23 1115  cefTRIAXone (ROCEPHIN) 1 g in sodium chloride 0.9 % 100 mL IVPB        1 g 200 mL/hr over 30 Minutes Intravenous  Once 05/14/23 1110 05/14/23 1312       Subjective: No complaints After she wakes up denies complaints Discussed with son, walks with walker, lives with son, sometimes in morning she's confused when she just wakes up, sometimes she can fix Marzell [employer] sandwich for herself, typically oriented  Objective: Vitals:   05/14/23 2018 05/14/23 2332 05/15/23 0556 05/15/23 0846  BP:  (!) 117/57 (!) 107/58   Pulse:  100 91   Resp:  19 17   Temp:  98.2 F (36.8 C) 98 F (36.7 C)   TempSrc:  Oral    SpO2: 92% 90% 95% 97%    Intake/Output Summary (Last 24 hours) at 05/15/2023 1155 Last data filed at 05/15/2023 8295 Gross per 24 hour  Intake 2463.77 ml  Output 600 ml  Net 1863.77 ml   There were no vitals filed for this visit.  Examination:  General exam: appears chronically ill appearing, lethargic Respiratory system: diminished Cardiovascular system: RRR Gastrointestinal system: Abdomen is nondistended, soft and nontender.  Central nervous system: lethargic, follows commands. Extremities: no LEE   Data Reviewed: I have personally reviewed following labs and imaging studies  CBC: Recent Labs  Lab 05/14/23 0628 05/15/23 0448  WBC 15.2* 13.1*  NEUTROABS 13.9*  --   HGB 11.9* 9.8*  HCT 38.5 32.5*  MCV 91.9 95.6  PLT 342 244    Basic Metabolic Panel: Recent Labs  Lab 05/14/23 0628 05/15/23 0448  NA 134* 136  K 4.0 4.0  CL 94* 103  CO2 28 24  GLUCOSE 80 114*  BUN 26* 20  CREATININE 0.84 0.67  CALCIUM 8.5* 7.8*    GFR: CrCl cannot be calculated (Unknown ideal weight.).  Liver Function Tests: Recent Labs  Lab 05/14/23 0628 05/15/23 0448  AST 10* 15  ALT 7 10  ALKPHOS 59 49  BILITOT 1.3* 0.6  PROT 8.0 6.2*  ALBUMIN 3.4* 2.6*    CBG: No results for input(s): "GLUCAP" in the last 168 hours.   Recent Results (from the past 240  hour(s))  Blood culture (routine x 2)     Status: None (Preliminary result)   Collection Time: 05/14/23  2:55 PM   Specimen: BLOOD  Result Value Ref Range Status   Specimen Description   Final    BLOOD SITE NOT SPECIFIED Performed at [facility], [address] [place], [place] [address]    Special Requests   Final    BOTTLES DRAWN AEROBIC AND ANAEROBIC Blood Culture adequate volume Performed at [facility], [address], [place], [place] [address]    Culture   Final    NO GROWTH < 24 HOURS Performed at [facility], [address], [place], [place] [address]    Report Status PENDING  Incomplete  Blood culture (routine x 2)     Status: None (Preliminary result)   Collection Time: 05/14/23  3:05 PM   Specimen: BLOOD  Result Value Ref Range Status   Specimen Description   Final    BLOOD SITE NOT SPECIFIED Performed at [facility], [address] [place], [place] [address]    Special Requests   Final    BOTTLES DRAWN AEROBIC ONLY Blood Culture results may not be optimal due to an inadequate volume of blood received in culture bottles Performed at [facility], [address] [place], [place] [address]    Culture   Final    NO GROWTH < 24 HOURS Performed at [facility], [address], [place], [place] [address]    Report Status PENDING  Incomplete         Radiology Studies: DG Chest Portable 1 View  Result Date: 05/14/2023 CLINICAL DATA:  Cough EXAM: PORTABLE CHEST 1 VIEW COMPARISON:  CXR 05/02/20 FINDINGS: No pleural effusion. No pneumothorax. Trace left pleural effusion, decreased from prior exam. Kyphoplasty changes in the mid and lower thoracic spine. Contrast opacify the renal collecting system on the right. Bibasilar hazy airspace opacities are nonspecific and could represent atelectasis or infection. No radiographically apparent displaced rib fractures. Visualized upper  abdomen is unremarkable. IMPRESSION: Hazy bibasilar airspace opacities could represent atelectasis or infection. Electronically Signed   By: Lorenza Cambridge M.D.   On: 05/14/2023 10:48   CT ABDOMEN PELVIS W CONTRAST  Result Date: 05/14/2023 CLINICAL DATA:  Left lower quadrant abdominal pain and constipation. EXAM: CT ABDOMEN AND PELVIS WITH CONTRAST TECHNIQUE: Multidetector CT imaging of the abdomen and pelvis was performed using the standard protocol following bolus administration of intravenous contrast. RADIATION DOSE REDUCTION: This exam was performed according to the departmental dose-optimization program which includes automated exposure control,  adjustment of the mA and/or kV according to patient size and/or use of iterative reconstruction technique. CONTRAST:  75mL OMNIPAQUE IOHEXOL 300 MG/ML  SOLN COMPARISON:  02/14/2022 FINDINGS: Lower chest: Mild cardiomegaly. Aortic and coronary atherosclerosis. Mitral and aortic valve calcifications. Dependent atelectasis in both lower lobes. Hepatobiliary: Mild intrahepatic biliary prominence with the common hepatic duct at 1.1 cm in the common bile duct at 0.9 cm, mildly dilated. Previously these measured 0.8 cm and 0.6 cm, respectively. No directly visualized choledocholithiasis. No significant focal parenchymal lesions in the liver observed. Pancreas: Unremarkable Spleen: Unremarkable Adrenals/Urinary Tract: Both adrenal glands appear normal. There continues to be an unusually large staghorn calculus casting throughout most of the left renal collecting systems, infundibula, and calices, and measuring proximally 3.7 by 2.5 by 5.9 cm. Despite the presence of this large stone, there does appear to be some excretion of contrast in the left kidney with little in the way of delayed nephrogram compared to the right. There is some mild enhancement along the wall of the left collecting system probably from chronic irritation, as well as left proximal hydroureter extending  to the level of the iliac vessel cross over, and mild abnormal enhancement of the wall of the left proximal ureter suggesting ureteritis. The left ureter distal to the iliac vessel cross over is relatively unremarkable. Two nonobstructive right kidney lower pole calculi are present, the larger measuring 0.5 cm in long axis. Urinary bladder unremarkable aside from [name] suspected left bladder diverticulum. Stomach/Bowel: Just above the level of the inguinal ring on the left, there is [name] left lateral abdominal wall hernia containing [name] small loop of proximal sigmoid colon as shown on image 54 series 2 and image 32 series 7. No findings of strangulation or resulting obstruction. Mild sigmoid colon diverticulosis is present. Scattered air-fluid levels are present in nondilated loops of small bowel. No compelling findings of small-bowel obstruction. Vascular/Lymphatic: Atherosclerosis is present, including aortoiliac atherosclerotic disease. Nonocclusive atheromatous plaque proximally in the superior mesenteric artery. There is also some dorsal nonocclusive plaque at the origin of the celiac trunk. Reproductive: Uterus absent.  Adnexa unremarkable. Other: No supplemental non-categorized findings. Musculoskeletal: In addition to the left lateral lower anterior abdominal wall hernia just above the inguinal ring, we demonstrate two fat containing supraumbilical hernias as shown on image 78 series 9. Prior sacroplasty and prior vertebral augmentations at the T11, L1, and L3 levels without substantial change. There continues to be about 6 mm of posterior retropulsion at the L1 fracture site contributing to central narrowing of the thecal sac, and grade 1 retrolisthesis of L3 on L4. Moderate degenerative arthropathy of both hips. The patient's left hand was incidentally included in the imaging region, demonstrating severe hyper extension at the MCP joint of the left thumb, degenerative arthropathy and possible subluxation at the  first carpometacarpal joint, and suspected widening of the scapholunate interval suggesting scapholunate ligament tear. IMPRESSION: 1. Left lateral abdominal wall hernia just above the level of the inguinal ring containing [name] small loop of proximal sigmoid colon, without findings of strangulation or resulting obstruction. 2. There is an unusually large staghorn calculus casting throughout most of the left renal collecting systems, infundibula, and calices, and measuring up to 5.9 cm in long axis. There is some excretion of contrast in the left kidney with little in the way of delayed nephrogram compared to the right. There is also left proximal hydroureter extending to the level of the iliac vessel cross over, with mild abnormal enhancement of the wall of  the left collecting system and left proximal ureter suggesting ureteritis. 3. Two nonobstructive right kidney lower pole calculi. 4. Scattered air-fluid levels in nondilated loops of small bowel, query mild ileus. 5. Mild cardiomegaly with aortic and coronary atherosclerosis. Mitral and aortic valve calcifications. 6. Mild intrahepatic and extrahepatic biliary dilatation, increased from prior. No directly visualized choledocholithiasis. 7. Prior sacroplasty and prior vertebral augmentations at T11, L1, and L3. 8. Moderate degenerative arthropathy of both hips. 9. Two fat containing supraumbilical hernias. 10. Incidental inclusion of the left hand suggests probable scapholunate ligament tear, hyperextension of the MCP joint of the thumb, and degenerative arthropathy and possible subluxation at the first carpometacarpal articulation. Aortic Atherosclerosis (ICD10-I70.0). Electronically Signed   By: Gaylyn Rong M.D.   On: 05/14/2023 09:03        Scheduled Meds:  aspirin EC  81 mg Oral Daily   clotrimazole   Topical BID   DULoxetine  60 mg Oral Daily   ipratropium-albuterol  3 mL Nebulization BID   polyethylene glycol  17 g Oral BID   senna-docusate   1 tablet Oral BID   Continuous Infusions:  azithromycin     cefTRIAXone (ROCEPHIN)  IV 1 g (05/15/23 1114)     LOS: 1 day    Time spent: over 30 min    Lacretia Nicks, MD [facility]   To contact the attending provider between 7A-7P or the covering provider during after hours 7P-7A, please log into the web site [email or web address] and access using universal [place] password for that web site. If you do not have the password, please call the hospital operator.  05/15/2023, 11:55 AM

## 2023-05-16 DIAGNOSIS — J9621 Acute and chronic respiratory failure with hypoxia: Secondary | ICD-10-CM | POA: Diagnosis not present

## 2023-05-16 LAB — IRON AND TIBC
Iron: 17 ug/dL — ABNORMAL LOW (ref 28–170)
Saturation Ratios: 11 % (ref 10.4–31.8)
TIBC: 162 ug/dL — ABNORMAL LOW (ref 250–450)
UIBC: 145 ug/dL

## 2023-05-16 LAB — CBC WITH DIFFERENTIAL/PLATELET
Abs Immature Granulocytes: 0.04 10*3/uL (ref 0.00–0.07)
Basophils Absolute: 0 10*3/uL (ref 0.0–0.1)
Basophils Relative: 0 %
Eosinophils Absolute: 0 10*3/uL (ref 0.0–0.5)
Eosinophils Relative: 0 %
HCT: 32.1 % — ABNORMAL LOW (ref 36.0–46.0)
Hemoglobin: 9.4 g/dL — ABNORMAL LOW (ref 12.0–15.0)
Immature Granulocytes: 0 %
Lymphocytes Relative: 8 %
Lymphs Abs: 0.7 10*3/uL (ref 0.7–4.0)
MCH: 28.2 pg (ref 26.0–34.0)
MCHC: 29.3 g/dL — ABNORMAL LOW (ref 30.0–36.0)
MCV: 96.4 fL (ref 80.0–100.0)
Monocytes Absolute: 0.5 10*3/uL (ref 0.1–1.0)
Monocytes Relative: 6 %
Neutro Abs: 7.6 10*3/uL (ref 1.7–7.7)
Neutrophils Relative %: 86 %
Platelets: 287 10*3/uL (ref 150–400)
RBC: 3.33 MIL/uL — ABNORMAL LOW (ref 3.87–5.11)
RDW: 14.1 % (ref 11.5–15.5)
WBC: 9 10*3/uL (ref 4.0–10.5)
nRBC: 0 % (ref 0.0–0.2)

## 2023-05-16 LAB — COMPREHENSIVE METABOLIC PANEL
ALT: 9 U/L (ref 0–44)
AST: 14 U/L — ABNORMAL LOW (ref 15–41)
Albumin: 2.5 g/dL — ABNORMAL LOW (ref 3.5–5.0)
Alkaline Phosphatase: 54 U/L (ref 38–126)
Anion gap: 8 (ref 5–15)
BUN: 18 mg/dL (ref 8–23)
CO2: 28 mmol/L (ref 22–32)
Calcium: 8.1 mg/dL — ABNORMAL LOW (ref 8.9–10.3)
Chloride: 104 mmol/L (ref 98–111)
Creatinine, Ser: 0.7 mg/dL (ref 0.44–1.00)
GFR, Estimated: >60 mL/min (ref >60)
Glucose, Bld: 195 mg/dL — ABNORMAL HIGH (ref 70–99)
Potassium: 4.2 mmol/L (ref 3.5–5.1)
Sodium: 140 mmol/L (ref 135–145)
Total Bilirubin: 0.5 mg/dL (ref 0.3–1.2)
Total Protein: 6.2 g/dL — ABNORMAL LOW (ref 6.5–8.1)

## 2023-05-16 LAB — VITAMIN B12: Vitamin B-12: 758 pg/mL (ref 180–914)

## 2023-05-16 LAB — TSH: TSH: 1.952 u[IU]/mL (ref 0.350–4.500)

## 2023-05-16 LAB — PHOSPHORUS: Phosphorus: 3 mg/dL (ref 2.5–4.6)

## 2023-05-16 LAB — CULTURE, BLOOD (ROUTINE X 2): Special Requests: ADEQUATE

## 2023-05-16 LAB — FOLATE: Folate: 6.1 ng/mL (ref >5.9)

## 2023-05-16 LAB — FERRITIN: Ferritin: 135 ng/mL (ref 11–307)

## 2023-05-16 LAB — MAGNESIUM: Magnesium: 2.7 mg/dL — ABNORMAL HIGH (ref 1.7–2.4)

## 2023-05-16 MED ORDER — ORAL CARE MOUTH RINSE: 15.0000 mL | OROMUCOSAL | Status: DC | PRN

## 2023-05-16 MED ORDER — FERROUS GLUCONATE 324 (38 FE) MG PO TABS
324.0000 mg | ORAL_TABLET | ORAL | Status: DC
  Administered 2023-05-18 – 2023-05-22 (×3): 324 mg via ORAL
  Filled 2023-05-16 (×4): qty 1

## 2023-05-16 MED ORDER — THIAMINE MONONITRATE 100 MG PO TABS
100.0000 mg | ORAL_TABLET | Freq: Every day | ORAL | Status: DC
  Administered 2023-05-18 – 2023-05-22 (×4): 100 mg via ORAL
  Filled 2023-05-16 (×6): qty 1

## 2023-05-16 NOTE — Progress Notes (Signed)
PROGRESS NOTE    [name]  ZOX:[id number] DOB: December 28, 1936 DOA: 05/14/2023 PCP: Annita Brod, MD  Chief Complaint  Patient presents with   Constipation    Brief Narrative:   [name] is [name] 87 y.o. female with medical history significant of osteoarthritis, chronic back pain, history of colitis, lumbar vertebrae compression fracture, sacral fracture, COPD, delirium, hypertension, osteoporosis, history of multiple episodes of pneumonia who presented to the emergency department complaints of constipation associated with LLQ pain.  She stated she has not had [name] full bowel movement for the past 2 to 3 weeks, but stated last evening she had [name] small amount bowel movement.  She stated the stools were very solid.  Her appetite is decreased, no nausea, emesis, diarrhea, melena or hematochezia.   No flank pain, dysuria, frequency or hematuria. No fever, chills or night sweats. No sore throat, rhinorrhea, dyspnea, wheezing or hemoptysis.  No chest pain, palpitations, diaphoresis, PND, orthopnea or pitting edema of the lower extremities.  No polyuria, polydipsia, polyphagia or blurred vision.   Lab work: Urinalysis was cloudy with large hemoglobin and large leukocyte esterase.  Ketones of 20 and protein of 30 mg/dL.  Microscopic examination shows more than 50 RBC per hpf, more than 50 WBC per hpf, rare bacteria and positive WBC clumps.  CBC with [name] white count of 15.2 with 93% neutrophils, hemoglobin 11.9 g/dL platelets 811.  Lipase was normal.  CMP showed his sodium 134, chloride 94 mmol/L.  BUN was 26 total bilirubin 1.3 mg/dL.  Albumin 3.4 g/dL.  The rest of the CMP measurements were unremarkable.   Imaging: Portable 1 view chest radiograph with hazy bibasilar airspace opacities that could represent atelectasis or infection.  CT abdomen/pelvis with contrast showing left lateral abdominal wall hernia just above the level of the inguinal ring containing [name] small loop of proximal sigmoid colon without  findings of strangulation or obstruction.  There is [name] large staghorn calculus in the left renal collecting system measuring up to 5.9 cm.  There is also left proximal hide ureter with mild abnormal enhancement of the wall of the left collecting system in the left proximal ureter suggesting ureteritis.  2 nonobstructive right kidney lower pole calculi.  Scattered air-fluid levels in nondilated loops of small bowel, questionable mild ileus.  Mild cardiomegaly with aortic and coronary arthrosclerosis.  Mitral and aortic valve calcifications.  Mild intrahepatic and extrahepatic biliary dilatation.  No visualized choledocholithiasis.  Please see images and full radiology report for further details.   ED course: Initial vital signs were temperature 98.2 F pulse 98, respiration 18, BP 127/63 mmHg O2 sat 95% on room air.  Patient received acetaminophen 650 mg p.o. x 1, azithromycin 500 mg IVPB, ceftriaxone 1 g IVPB, fentanyl 50 mcg IVP, hydromorphone 0.5 mg IVP x 2 and 1000 mL of normal saline bolus.  Assessment & Plan:   Principal Problem:   Acute on chronic respiratory failure with hypoxia (HCC) Active Problems:   Normocytic anemia   Hypertension   COPD (chronic obstructive pulmonary disease) (HCC)   Depression   CAP (community acquired pneumonia)   Constipation   Ureteritis   Staghorn calculus  Acute Metabolic Encephalopathy resolved Delirium precautions TSH , B12, folate all wnl Treating below   Community Acquired Pneumonia  Acute on Chronic Hypoxic Respiratory Failure  COPD Currently on 5 L [place] (typically on 2-3 L at home) Hazy bibasilar airspace opacities on CXR Urine strep, sputum cx Negative covid, flu, RSV Blood cultures pending Ceftriaxone/azithromycin No  wheezing, no need for steroids Wean O2 as tolerated  Ureteritis  Staghorn Calculus  On abx as above Follow UA/reflex culture On abx for pneumonia above Per admitting provider, urology rec abx prn and outpatient follow  up  Constipation Miralax  Hypertension Not on BP meds  Anemia  Iron Def Downtrend, suspect component of dilution B12 wnl, folate wnl Evidence of iron def - supplemental iron   Left Lateral Abdominal Wall Hernia with Samani Deal loop of proximal sigmoid colon Fat containing Supraumbilical Hernias No findings of strangulation or obstruction Follow outpatient  Air Fluid Levels in Nondilated Loops of Small Bowel ? Mild ileus, follow  Intrahepatic and Extrahepatic Biliary Dilatation Bilirubin, alk phos, and LFT's wnl Follow   Prior Sacroplasty and Prior Vertebral Augmentations Degenerative Arthropathy of Both HIps Scapholunate Ligament Tear, Hyperextension of MCP Joint of thumb and generative arthropathy and possible subluxation of First carpometacarpal articulation Follow outpatient      DVT prophylaxis: SCD Code Status: full Family Communication: none Disposition:   Status is: Inpatient Remains inpatient appropriate because: need for continued inpatient management   Consultants:  none  Procedures:  none  Antimicrobials:  Anti-infectives (From admission, onward)    Start     Dose/Rate Route Frequency Ordered Stop   05/15/23 1600  azithromycin (ZITHROMAX) 500 mg in sodium chloride 0.9 % 250 mL IVPB        500 mg 250 mL/hr over 60 Minutes Intravenous Every 24 hours 05/14/23 1453 05/19/23 1559   05/15/23 1100  cefTRIAXone (ROCEPHIN) 1 g in sodium chloride 0.9 % 100 mL IVPB        1 g 200 mL/hr over 30 Minutes Intravenous Every 24 hours 05/14/23 1453 05/19/23 1059   05/14/23 1315  azithromycin (ZITHROMAX) 500 mg in sodium chloride 0.9 % 250 mL IVPB        500 mg 250 mL/hr over 60 Minutes Intravenous  Once 05/14/23 1313 05/14/23 1722   05/14/23 1115  cefTRIAXone (ROCEPHIN) 1 g in sodium chloride 0.9 % 100 mL IVPB        1 g 200 mL/hr over 30 Minutes Intravenous  Once 05/14/23 1110 05/14/23 1312       Subjective: Aggravated, wants to go home  Objective: Vitals:    05/15/23 2002 05/16/23 0611 05/16/23 0826 05/16/23 1234  BP: 121/62 129/73  120/66  Pulse: 94 (!) 106  95  Resp: 16 17  16   Temp: 97.9 F (36.6 C) 97.7 F (36.5 C)  98.3 F (36.8 C)  TempSrc:  Oral  Oral  SpO2: 91% 91% 92% 100%    Intake/Output Summary (Last 24 hours) at 05/16/2023 1427 Last data filed at 05/16/2023 1200 Gross per 24 hour  Intake 918.66 ml  Output 230 ml  Net 688.66 ml   There were no vitals filed for this visit.  Examination:  General: No acute distress. Cardiovascular: RRR Lungs: diminished Abdomen: Soft, nontender, nondistended Neurological: Alert and oriented 3. Moves all extremities 4 with equal strength. Cranial nerves II through XII grossly intact. Extremities: No clubbing or cyanosis. No edema.   Data Reviewed: I have personally reviewed following labs and imaging studies  CBC: Recent Labs  Lab 05/14/23 0628 05/15/23 0448 05/16/23 0401  WBC 15.2* 13.1* 9.0  NEUTROABS 13.9*  --  7.6  HGB 11.9* 9.8* 9.4*  HCT 38.5 32.5* 32.1*  MCV 91.9 95.6 96.4  PLT 342 244 287    Basic Metabolic Panel: Recent Labs  Lab 05/14/23 0628 05/15/23 0448 05/16/23 0401  NA 134*  136 140  K 4.0 4.0 4.2  CL 94* 103 104  CO2 28 24 28   GLUCOSE 80 114* 195*  BUN 26* 20 18  CREATININE 0.84 0.67 0.70  CALCIUM 8.5* 7.8* 8.1*  MG  --   --  2.7*  PHOS  --   --  3.0    GFR: CrCl cannot be calculated (Unknown ideal weight.).  Liver Function Tests: Recent Labs  Lab 05/14/23 0628 05/15/23 0448 05/16/23 0401  AST 10* 15 14*  ALT 7 10 9   ALKPHOS 59 49 54  BILITOT 1.3* 0.6 0.5  PROT 8.0 6.2* 6.2*  ALBUMIN 3.4* 2.6* 2.5*    CBG: No results for input(s): "GLUCAP" in the last 168 hours.   Recent Results (from the past 240 hour(s))  Blood culture (routine x 2)     Status: None (Preliminary result)   Collection Time: 05/14/23  2:55 PM   Specimen: BLOOD  Result Value Ref Range Status   Specimen Description   Final    BLOOD SITE NOT  SPECIFIED Performed at [facility], [address], [place], [place] [address]    Special Requests   Final    BOTTLES DRAWN AEROBIC AND ANAEROBIC Blood Culture adequate volume Performed at [facility], [address] [place], [place] [address]    Culture   Final    NO GROWTH 2 DAYS Performed at [facility], [address] [place], [place] [address]    Report Status PENDING  Incomplete  Blood culture (routine x 2)     Status: None (Preliminary result)   Collection Time: 05/14/23  3:05 PM   Specimen: BLOOD  Result Value Ref Range Status   Specimen Description   Final    BLOOD SITE NOT SPECIFIED Performed at [facility], [address], [place], [place] [address]    Special Requests   Final    BOTTLES DRAWN AEROBIC ONLY Blood Culture results may not be optimal due to an inadequate volume of blood received in culture bottles Performed at [facility], [address], [place], [place] [address]    Culture   Final    NO GROWTH 2 DAYS Performed at [facility], [address] [place], [place] [address]    Report Status PENDING  Incomplete         Radiology Studies: No results found.      Scheduled Meds:  aspirin EC  81 mg Oral Daily   clotrimazole   Topical BID   DULoxetine  60 mg Oral Daily   ipratropium-albuterol  3 mL Nebulization BID   polyethylene glycol  17 g Oral TID   senna-docusate  1 tablet Oral BID   thiamine  100 mg Oral Daily   Continuous Infusions:  azithromycin Stopped (05/15/23 1649)   cefTRIAXone (ROCEPHIN)  IV 1 g (05/16/23 1144)   dextrose 5% lactated ringers 50 mL/hr at 05/15/23 1902     LOS: 2 days    Time spent: over 30 min    Lacretia Nicks, MD [facility]   To contact the attending provider between 7A-7P or the covering provider during after hours 7P-7A, please log into the web site [email or web address] and access using universal [facility] password for that web site. If you do not have the password, please call the hospital operator.  05/16/2023, 2:27 PM

## 2023-05-16 NOTE — Evaluation (Signed)
Physical Therapy Evaluation Patient Details Name: [name] MRN: [id number] DOB: 26-Jan-1936 Today's Date: 05/16/2023  History of Present Illness  [name] is a 87 y.o. female with medical history significant of osteoarthritis, chronic back pain, history of colitis, lumbar vertebrae compression fracture, sacral fracture, COPD, delirium, hypertension, osteoporosis, history of multiple episodes of pneumonia who presented to the emergency department complaints of constipation associated with LLQ pain. Patient admitted for acute on chronic respiratory failure seconday to pneumonia.  Clinical Impression  Pt admitted with above diagnosis.  Pt currently with functional limitations due to the deficits listed below (see PT Problem List). Pt will benefit from acute skilled PT to increase their independence and safety with mobility to allow discharge.  Pt requiring increased assist for bed mobility and transferring this afternoon.  Pt also with decreased cognition but able to follow simple one step commands for OOB to [facility] and then recliner.  Pt does report that she is not performing much mobility at baseline.  Previous level and function and home environment from OT evaluation for which pt seemed more coherent then this afternoon.  Uncertain if family will be able to provide increased level of assist, so pt may need post acute rehab upon d/c.        Recommendations for follow up therapy are one component of a multi-disciplinary discharge planning process, led by the attending physician.  Recommendations may be updated based on patient status, additional functional criteria and insurance authorization.  Follow Up Recommendations Can patient physically be transported by private vehicle: No     Assistance Recommended at Discharge Intermittent Supervision/Assistance  Patient can return home with the following  A lot of help with walking and/or transfers;A lot of help with  bathing/dressing/bathroom;Assist for transportation;Assistance with cooking/housework;Help with stairs or ramp for entrance;Direct supervision/assist for financial management;Direct supervision/assist for medications management    Equipment Recommendations None recommended by PT  Recommendations for Other Services       Functional Status Assessment Patient has had a recent decline in their functional status and demonstrates the ability to make significant improvements in function in a reasonable and predictable amount of time.     Precautions / Restrictions Precautions Precautions: Fall Precaution Comments: monitor o2 Restrictions Weight Bearing Restrictions: No      Mobility  Bed Mobility Overal bed mobility: Needs Assistance Bed Mobility: Supine to Sit     Supine to sit: Total assist, +2 for physical assistance     General bed mobility comments: pt agreeable to mobilize but not initiating and required total assist to get to EOB    Transfers Overall transfer level: Needs assistance Equipment used: None Transfers: Sit to/from Stand, Bed to chair/wheelchair/BSC Sit to Stand: Mod assist, +2 safety/equipment Stand pivot transfers: Min assist, +2 safety/equipment         General transfer comment: cues for hand placement for transfer to [facility], reliant on UE support/assist, assist for rise and steady as well as controlling descent; pt able to void on [facility] then stand again with assist to remove BSC and replace with recliner; remained on 5L O2 [place] during session and SPO2 91-92% with mobility    Ambulation/Gait                  Stairs            Wheelchair Mobility    Modified Rankin (Stroke Patients Only)       Balance Overall balance assessment: Needs assistance Sitting-balance support: No upper extremity supported,  Feet supported Sitting balance-Leahy Scale: Fair Sitting balance - Comments: once cued to sit upright without assist, pt was able to maintain  static sitting   Standing balance support: Bilateral upper extremity supported, During functional activity Standing balance-Leahy Scale: Poor                               Pertinent Vitals/Pain Pain Assessment Pain Assessment: Faces Faces Pain Scale: Hurts a little bit Pain Location: Back Pain Descriptors / Indicators: Grimacing Pain Intervention(s): Repositioned, Monitored during session    Home Living Family/patient expects to be discharged to:: Private residence Living Arrangements: Children Available Help at Discharge: Family Type of Home: House Home Access: Stairs to enter   Secretary/administrator of Steps: 3   Home Layout: One level Home Equipment: Agricultural consultant (2 wheels);Rollator (4 wheels);BSC/3in1;Shower seat;Grab bars - toilet;Grab bars - tub/shower;Hospital bed Additional Comments: Reports son is home 24/7    Prior Function Prior Level of Function : Needs assist             Mobility Comments: reports predominantly in bed but gets up "When I need to" to bathroom or sometimes walks to kitchen. Reports chronic back pain limits her mobility. ADLs Comments: has assistance for ADLs - reports she can get up to the [employer] "sometimes" by herself.     Hand Dominance   Dominant Hand: Right    Extremity/Trunk Assessment   Upper Extremity Assessment Upper Extremity Assessment: Overall WFL for tasks assessed (did not test strength due to patient reporting upper and lower back pain. Able to assist with bed transfer but appears generally weak.)    Lower Extremity Assessment Lower Extremity Assessment: Generalized weakness    Cervical / Trunk Assessment Cervical / Trunk Assessment: Kyphotic  Communication   Communication: No difficulties  Cognition Arousal/Alertness: Lethargic Behavior During Therapy: WFL for tasks assessed/performed Overall Cognitive Status: No family/caregiver present to determine baseline cognitive functioning                                  General Comments: sleepy during session, also making statements out of context like "they're working on her clothes, she needs to wear more.";  RN reports cognition wax/wanes        General Comments      Exercises     Assessment/Plan    PT Assessment Patient needs continued PT services  PT Problem List Decreased strength;Decreased activity tolerance;Decreased mobility;Decreased balance;Decreased knowledge of use of DME;Decreased cognition       PT Treatment Interventions DME instruction;Gait training;Therapeutic exercise;Functional mobility training;Therapeutic activities;Patient/family education;Balance training    PT Goals (Current goals can be found in the Care Plan section)  Acute Rehab PT Goals PT Goal Formulation: Patient unable to participate in goal setting Time For Goal Achievement: 05/30/23 Potential to Achieve Goals: Fair    Frequency Min 1X/week     Co-evaluation               AM-PAC PT "6 Clicks" Mobility  Outcome Measure Help needed turning from your back to your side while in a flat bed without using bedrails?: Total Help needed moving from lying on your back to sitting on the side of a flat bed without using bedrails?: Total Help needed moving to and from a bed to a chair (including a wheelchair)?: A Lot Help needed standing up from a chair using your arms (  e.g., wheelchair or bedside chair)?: A Lot Help needed to walk in hospital room?: Total Help needed climbing 3-5 steps with a railing? : Total 6 Click Score: 8    End of Session Equipment Utilized During Treatment: Gait belt;Oxygen Activity Tolerance: Patient tolerated treatment well Patient left: in chair;with call bell/phone within reach;with chair alarm set;with nursing/sitter in room Nurse Communication: Mobility status PT Visit Diagnosis: Unsteadiness on feet (R26.81);Muscle weakness (generalized) (M62.81)    Time: 5621-3086 PT Time Calculation (min) (ACUTE  ONLY): 20 min   Charges:   PT Evaluation $PT Eval Low Complexity: 1 Low        Kati PT, DPT Physical Therapist Acute Rehabilitation Services Office: [phone]   Janan Halter [place] 05/16/2023, 4:43 PM

## 2023-05-16 NOTE — Evaluation (Signed)
Occupational Therapy Evaluation Patient Details Name: [name] MRN: [id number] DOB: July 11, 1936 Today's Date: 05/16/2023   History of Present Illness [name] is a 87 y.o. female with medical history significant of osteoarthritis, chronic back pain, history of colitis, lumbar vertebrae compression fracture, sacral fracture, COPD, delirium, hypertension, osteoporosis, history of multiple episodes of pneumonia who presented to the emergency department complaints of constipation associated with LLQ pain. Patient admitted for acute on chronic respiratory failure seconday to pna.   Clinical Impression   [name] is an 87 year old woman who presents with generalized weakness, decreased activity tolerance, pain,  and impaired cardiopulmonary endurance and currently on 5 LNC. On evaluation she was limited by back pain and only sat edge of bed. She can assist with UB ADLs but needs significant assistance for toileting and LB dressing. She reports she has 24/7 assist of son at home and is predominantly in bed but gets up "When I need too." Unsure of how much assistance she is needing from son especially for ADLs. Patient will benefit from skilled OT services while in hospital to improve deficits and learn compensatory strategies as needed in order to return to PLOF.       Recommendations for follow up therapy are one component of a multi-disciplinary discharge planning process, led by the attending physician.  Recommendations may be updated based on patient status, additional functional criteria and insurance authorization.   Assistance Recommended at Discharge Frequent or constant Supervision/Assistance  Patient can return home with the following A little help with walking and/or transfers;A lot of help with bathing/dressing/bathroom;Assistance with cooking/housework;Direct supervision/assist for medications management;Direct supervision/assist for financial management;Help with stairs or  ramp for entrance;Assist for transportation    Functional Status Assessment  Patient has had a recent decline in their functional status and/or demonstrates limited ability to make significant improvements in function in a reasonable and predictable amount of time  Equipment Recommendations  None recommended by OT    Recommendations for Other Services       Precautions / Restrictions Precautions Precautions: Fall Precaution Comments: monitor o2 Restrictions Weight Bearing Restrictions: No      Mobility Bed Mobility Overal bed mobility: Needs Assistance Bed Mobility: Supine to Sit, Sit to Supine     Supine to sit: Mod assist Sit to supine: Mod assist   General bed mobility comments: Mod assist for trunk to transfer to edge of bed. Mod assist for LE to return to supine. Patient declined standing or transfer to chair secondary to back pain.    Transfers                          Balance Overall balance assessment: Needs assistance Sitting-balance support: No upper extremity supported, Feet supported Sitting balance-Leahy Scale: Fair                                     ADL either performed or assessed with clinical judgement   ADL Overall ADL's : Needs assistance/impaired Eating/Feeding: Set up;Sitting   Grooming: Set up;Sitting   Upper Body Bathing: Minimal assistance;Sitting   Lower Body Bathing: Maximal assistance;Sitting/lateral leans   Upper Body Dressing : Minimal assistance;Sitting   Lower Body Dressing: Maximal assistance;Bed level       Toileting- Clothing Manipulation and Hygiene: Total assistance;Bed level       Functional mobility during ADLs: Moderate assistance General  ADL Comments: Mod assist for bed mobility. Declined standing.     Vision Patient Visual Report: No change from baseline Vision Assessment?: No apparent visual deficits     Perception     Praxis      Pertinent Vitals/Pain Pain Assessment Pain  Assessment: Faces Faces Pain Scale: Hurts little more Pain Location: Back Pain Descriptors / Indicators: Grimacing Pain Intervention(s): Monitored during session     Hand Dominance Right   Extremity/Trunk Assessment Upper Extremity Assessment Upper Extremity Assessment: Overall WFL for tasks assessed (did not test strength due to patient reporting upper and lower back pain. Able to assist with bed transfer but appears generally weak.)   Lower Extremity Assessment Lower Extremity Assessment: Defer to PT evaluation   Cervical / Trunk Assessment Cervical / Trunk Assessment: Kyphotic   Communication Communication Communication: No difficulties   Cognition Arousal/Alertness: Awake/alert Behavior During Therapy: WFL for tasks assessed/performed Overall Cognitive Status: No family/caregiver present to determine baseline cognitive functioning                                 General Comments: Oriented to self, month but not year. Able to answer PLOF questions appropriately but unsure of reliability.     General Comments       Exercises     Shoulder Instructions      Home Living Family/patient expects to be discharged to:: Private residence Living Arrangements: Children Available Help at Discharge: Family Type of Home: House Home Access: Stairs to enter Secretary/administrator of Steps: 3   Home Layout: One level     Bathroom Shower/Tub: Chief Strategy Officer: Standard Bathroom Accessibility: Yes How Accessible: Accessible via walker Home Equipment: Rolling Walker (2 wheels);Rollator (4 wheels);BSC/3in1;Shower seat;Grab bars - toilet;Grab bars - tub/shower;Hospital bed   Additional Comments: Reports son is home 24/7      Prior Functioning/Environment Prior Level of Function : Needs assist             Mobility Comments: reports predominantly in bed but gets up "When I need to" to bathroom or sometimes walks to kitchen. Reports chronic  back pain limits her mobility. ADLs Comments: has assistance for ADLs - reports she can get up to the [facility] "sometimes" by herself.        OT Problem List: Decreased strength;Decreased activity tolerance;Impaired balance (sitting and/or standing);Pain;Cardiopulmonary status limiting activity;Decreased knowledge of use of DME or AE;Decreased safety awareness      OT Treatment/Interventions: Self-care/ADL training;Therapeutic exercise;DME and/or AE instruction;Therapeutic activities;Balance training;Patient/family education    OT Goals(Current goals can be found in the care plan section) Acute Rehab OT Goals Patient Stated Goal: to rest OT Goal Formulation: With patient Time For Goal Achievement: 05/30/23 Potential to Achieve Goals: Fair  OT Frequency: Min 2X/week    Co-evaluation              AM-PAC OT "6 Clicks" Daily Activity     Outcome Measure Help from another person eating meals?: A Little Help from another person taking care of personal grooming?: A Little Help from another person toileting, which includes using toliet, bedpan, or urinal?: Total Help from another person bathing (including washing, rinsing, drying)?: A Lot Help from another person to put on and taking off regular upper body clothing?: A Little Help from another person to put on and taking off regular lower body clothing?: Total 6 Click Score: 13   End of Session Equipment  Utilized During Treatment: Oxygen Nurse Communication: Mobility status  Activity Tolerance: Patient limited by fatigue;Patient limited by pain Patient left: in bed;with call bell/phone within reach;with bed alarm set  OT Visit Diagnosis: Muscle weakness (generalized) (M62.81)                Time: 0981-1914 OT Time Calculation (min): 16 min Charges:  OT General Charges $OT Visit: 1 Visit OT Evaluation $OT Eval Low Complexity: 1 Low  Donnella Sham, OTR/L Acute Care Rehab Services  Office [phone]   Kelli Churn 05/16/2023, 1:18 PM

## 2023-05-17 ENCOUNTER — Inpatient Hospital Stay (HOSPITAL_COMMUNITY): Payer: Medicare Other

## 2023-05-17 DIAGNOSIS — J9621 Acute and chronic respiratory failure with hypoxia: Secondary | ICD-10-CM | POA: Diagnosis not present

## 2023-05-17 LAB — COMPREHENSIVE METABOLIC PANEL
ALT: 28 U/L (ref 0–44)
AST: 49 U/L — ABNORMAL HIGH (ref 15–41)
Albumin: 2.5 g/dL — ABNORMAL LOW (ref 3.5–5.0)
Alkaline Phosphatase: 51 U/L (ref 38–126)
Anion gap: 6 (ref 5–15)
BUN: 15 mg/dL (ref 8–23)
CO2: 29 mmol/L (ref 22–32)
Calcium: 8 mg/dL — ABNORMAL LOW (ref 8.9–10.3)
Chloride: 106 mmol/L (ref 98–111)
Creatinine, Ser: 0.8 mg/dL (ref 0.44–1.00)
GFR, Estimated: >60 mL/min (ref >60)
Glucose, Bld: 154 mg/dL — ABNORMAL HIGH (ref 70–99)
Potassium: 3.9 mmol/L (ref 3.5–5.1)
Sodium: 141 mmol/L (ref 135–145)
Total Bilirubin: 0.3 mg/dL (ref 0.3–1.2)
Total Protein: 6.2 g/dL — ABNORMAL LOW (ref 6.5–8.1)

## 2023-05-17 LAB — CULTURE, BLOOD (ROUTINE X 2)

## 2023-05-17 LAB — HEMOGLOBIN AND HEMATOCRIT, BLOOD
HCT: 32.1 % — ABNORMAL LOW (ref 36.0–46.0)
HCT: 32.3 % — ABNORMAL LOW (ref 36.0–46.0)
Hemoglobin: 9.3 g/dL — ABNORMAL LOW (ref 12.0–15.0)
Hemoglobin: 9.2 g/dL — ABNORMAL LOW (ref 12.0–15.0)

## 2023-05-17 LAB — TYPE AND SCREEN
ABO/RH(D): O POS
Antibody Screen: NEGATIVE

## 2023-05-17 LAB — CBC WITH DIFFERENTIAL/PLATELET
Abs Immature Granulocytes: 0.06 10*3/uL (ref 0.00–0.07)
Basophils Absolute: 0 10*3/uL (ref 0.0–0.1)
Basophils Relative: 0 %
Eosinophils Absolute: 0 10*3/uL (ref 0.0–0.5)
Eosinophils Relative: 0 %
HCT: 31.1 % — ABNORMAL LOW (ref 36.0–46.0)
Hemoglobin: 9.3 g/dL — ABNORMAL LOW (ref 12.0–15.0)
Immature Granulocytes: 1 %
Lymphocytes Relative: 10 %
Lymphs Abs: 0.8 10*3/uL (ref 0.7–4.0)
MCH: 28.9 pg (ref 26.0–34.0)
MCHC: 29.9 g/dL — ABNORMAL LOW (ref 30.0–36.0)
MCV: 96.6 fL (ref 80.0–100.0)
Monocytes Absolute: 0.7 10*3/uL (ref 0.1–1.0)
Monocytes Relative: 8 %
Neutro Abs: 6.4 10*3/uL (ref 1.7–7.7)
Neutrophils Relative %: 81 %
Platelets: 245 10*3/uL (ref 150–400)
RBC: 3.22 MIL/uL — ABNORMAL LOW (ref 3.87–5.11)
RDW: 13.9 % (ref 11.5–15.5)
WBC: 7.9 10*3/uL (ref 4.0–10.5)
nRBC: 0 % (ref 0.0–0.2)

## 2023-05-17 LAB — BRAIN NATRIURETIC PEPTIDE: B Natriuretic Peptide: 1307.6 pg/mL — ABNORMAL HIGH (ref 0.0–100.0)

## 2023-05-17 LAB — BLOOD GAS, VENOUS
Acid-Base Excess: 6.7 mmol/L — ABNORMAL HIGH (ref 0.0–2.0)
Bicarbonate: 33.3 mmol/L — ABNORMAL HIGH (ref 20.0–28.0)
O2 Saturation: 91.2 %
Patient temperature: 36.6
pCO2, Ven: 58 mmHg (ref 44–60)
pH, Ven: 7.37 (ref 7.25–7.43)
pO2, Ven: 50 mmHg — ABNORMAL HIGH (ref 32–45)

## 2023-05-17 LAB — PHOSPHORUS: Phosphorus: 2.8 mg/dL (ref 2.5–4.6)

## 2023-05-17 LAB — MAGNESIUM: Magnesium: 2.4 mg/dL (ref 1.7–2.4)

## 2023-05-17 MED ORDER — HYDROMORPHONE HCL 1 MG/ML IJ SOLN
0.2500 mg | INTRAMUSCULAR | Status: DC | PRN
  Administered 2023-05-18 – 2023-05-22 (×2): 0.25 mg via INTRAVENOUS
  Filled 2023-05-17 (×2): qty 0.5

## 2023-05-17 MED ORDER — FUROSEMIDE 10 MG/ML IJ SOLN
40.0000 mg | Freq: Two times a day (BID) | INTRAMUSCULAR | Status: DC
  Administered 2023-05-17 – 2023-05-20 (×6): 40 mg via INTRAVENOUS
  Filled 2023-05-17 (×6): qty 4

## 2023-05-17 MED ORDER — LIDOCAINE 5 % EX PTCH
1.0000 | MEDICATED_PATCH | CUTANEOUS | Status: DC
  Administered 2023-05-17 – 2023-05-18 (×2): 1 via TRANSDERMAL
  Filled 2023-05-17 (×2): qty 1

## 2023-05-17 MED ORDER — ACETAMINOPHEN 500 MG PO TABS
1000.0000 mg | ORAL_TABLET | Freq: Three times a day (TID) | ORAL | Status: AC
  Administered 2023-05-18 – 2023-05-20 (×4): 1000 mg via ORAL
  Filled 2023-05-17 (×5): qty 2

## 2023-05-17 MED ORDER — ALBUMIN HUMAN 25 % IV SOLN
25.0000 g | Freq: Once | INTRAVENOUS | Status: AC
  Administered 2023-05-17: 25 g via INTRAVENOUS
  Filled 2023-05-17: qty 100

## 2023-05-17 MED ORDER — PANTOPRAZOLE SODIUM 40 MG IV SOLR
40.0000 mg | Freq: Two times a day (BID) | INTRAVENOUS | Status: DC
  Administered 2023-05-17 – 2023-05-22 (×11): 40 mg via INTRAVENOUS
  Filled 2023-05-17 (×11): qty 10

## 2023-05-17 NOTE — Progress Notes (Signed)
Speech Language Pathology Treatment: Dysphagia  Patient Details Name: [name] MRN: [id number] DOB: 05-Jun-1936 Today's Date: 05/17/2023 Time: 4098-1191 SLP Time Calculation (min) (ACUTE ONLY): 20 min  Assessment / Plan / Recommendation Clinical Impression  Patient seen by SLP for skilled treatment focused on dysphagia goals. Earlier today, patient was asleep and unable to be adequately aroused. When SLP entered room this afternoon (1315), patient was awake, alert and communicative.  Her voice is weak, hoarse and with frequent loss of voicing when talking. She was oriented to place and her current age, but stated year as "87", month as "June". She recognized her nurse when she saw her walk by room. She appears confused, telling SLP that she was in pain but does not trust anyone to give her medications as "they're trying to kill me". She repeated this a couple other times in conversation. She told SLP that she was not hungry but she was agreeable to some liquids, requesting water and Sprite. When SLP brought both drinks, she wanted the lemon lime soda, she drank approximately 30% of it before indicating she was done. Swallow initiation appeared timely and no overt s/s aspiration observed and no change in vitals. Patient declined to have any other PO's and told SLP that she has no appetite. SLP will plan to follow up next 1-2 dates to determine if objective swallow study (MBS) is still warranted.   HPI HPI: [name] is a 87 y.o. female who presented to the emergency department complaints of constipation associated with LLQ pain.  CXR 5/17: "Hazy bibasilar airspace opacities could represent atelectasis or infection." Pt with medical history significant of osteoarthritis, chronic back pain, history of colitis, lumbar vertebrae compression fracture, sacral fracture, COPD, delirium, hypertension, osteoporosis, history of multiple episodes of pneumonia      SLP Plan  Continue with current plan of  care      Recommendations for follow up therapy are one component of a multi-disciplinary discharge planning process, led by the attending physician.  Recommendations may be updated based on patient status, additional functional criteria and insurance authorization.    Recommendations  Diet recommendations: Dysphagia 1 (puree);Thin liquid Liquids provided via: Straw;Cup Medication Administration: Whole meds with liquid Supervision: Full supervision/cueing for compensatory strategies;Staff to assist with self feeding;Patient able to self feed Compensations: Slow rate;Small sips/bites Postural Changes and/or Swallow Maneuvers: Seated upright 90 degrees                  Oral care BID   Frequent or constant Supervision/Assistance Dysphagia, unspecified (R13.10)     Continue with current plan of care  Angela Nevin, MA, CCC-SLP Speech Therapy

## 2023-05-17 NOTE — Progress Notes (Addendum)
   05/17/23 1710 05/17/23 1712  Vitals  Temp 98.1 F (36.7 C)  --   Temp Source Oral  --   BP (!) 89/57 (!) 90/54  MAP (mmHg) 69 66  BP Location Right Arm Left Arm  BP Method Automatic Automatic  Patient Position (if appropriate) Lying Lying  Pulse Rate 98 (!) 101  Pulse Rate Source Monitor Monitor  MEWS COLOR  MEWS Score Color Green Yellow  Oxygen Therapy  SpO2 94 % 92 %  O2 Device Nasal Cannula Nasal Cannula  O2 Flow Rate (L/min)  --  3 L/min   Notified Dr. Lowell Guitar & asking if Lasix 40 IV should still be admin. Attempted several times to obtain manual BP in both arms. Pt extremely upset & yelling. Best attempt, obtained 88/54 on RUE, but very difficult to hear exact, due to pt being so irritated.  Dr. Lowell Guitar ordered albumin, follow up with lasix.

## 2023-05-17 NOTE — Progress Notes (Signed)
PROGRESS NOTE    ABBIEGAIL HAMPEL  ZOX:[id number] DOB: 1936/06/09 DOA: 05/14/2023 PCP: Annita Brod, MD  Chief Complaint  Patient presents with   Constipation    Brief Narrative:   [name] is [name] 87 y.o. female with medical history significant of osteoarthritis, chronic back pain, history of colitis, lumbar vertebrae compression fracture, sacral fracture, COPD, delirium, hypertension, osteoporosis, history of multiple episodes of pneumonia who presented to the emergency department complaints of constipation associated with LLQ pain.  She stated she has not had [name] full bowel movement for the past 2 to 3 weeks, but stated last evening she had Adriana Castagnola small amount bowel movement.  She stated the stools were very solid.  Her appetite is decreased, no nausea, emesis, diarrhea, melena or hematochezia.   No flank pain, dysuria, frequency or hematuria. No fever, chills or night sweats. No sore throat, rhinorrhea, dyspnea, wheezing or hemoptysis.  No chest pain, palpitations, diaphoresis, PND, orthopnea or pitting edema of the lower extremities.  No polyuria, polydipsia, polyphagia or blurred vision.   Lab work: Urinalysis was cloudy with large hemoglobin and large leukocyte esterase.  Ketones of 20 and protein of 30 mg/dL.  Microscopic examination shows more than 50 RBC per hpf, more than 50 WBC per hpf, rare bacteria and positive WBC clumps.  CBC with [name] white count of 15.2 with 93% neutrophils, hemoglobin 11.9 g/dL platelets 811.  Lipase was normal.  CMP showed his sodium 134, chloride 94 mmol/L.  BUN was 26 total bilirubin 1.3 mg/dL.  Albumin 3.4 g/dL.  The rest of the CMP measurements were unremarkable.   Imaging: Portable 1 view chest radiograph with hazy bibasilar airspace opacities that could represent atelectasis or infection.  CT abdomen/pelvis with contrast showing left lateral abdominal wall hernia just above the level of the inguinal ring containing [name] small loop of proximal sigmoid colon without  findings of strangulation or obstruction.  There is [name] large staghorn calculus in the left renal collecting system measuring up to 5.9 cm.  There is also left proximal hide ureter with mild abnormal enhancement of the wall of the left collecting system in the left proximal ureter suggesting ureteritis.  2 nonobstructive right kidney lower pole calculi.  Scattered air-fluid levels in nondilated loops of small bowel, questionable mild ileus.  Mild cardiomegaly with aortic and coronary arthrosclerosis.  Mitral and aortic valve calcifications.  Mild intrahepatic and extrahepatic biliary dilatation.  No visualized choledocholithiasis.  Please see images and full radiology report for further details.   ED course: Initial vital signs were temperature 98.2 F pulse 98, respiration 18, BP 127/63 mmHg O2 sat 95% on room air.  Patient received acetaminophen 650 mg p.o. x 1, azithromycin 500 mg IVPB, ceftriaxone 1 g IVPB, fentanyl 50 mcg IVP, hydromorphone 0.5 mg IVP x 2 and 1000 mL of normal saline bolus.  Assessment & Plan:   Principal Problem:   Acute on chronic respiratory failure with hypoxia (HCC) Active Problems:   Normocytic anemia   Hypertension   COPD (chronic obstructive pulmonary disease) (HCC)   Depression   Adriana (community acquired pneumonia)   Constipation   Ureteritis   Staghorn calculus  Goals of Care Discussed with son over phone, he notes she'd want full code.  Declined palliative care involvement at this time.  Asked him to let us know how he thinks she looks compared to baseline when he gets here (notes he spoke to her this AM and she sounded alright at that time)  Acute Metabolic  Encephalopathy Worse this morning, ? If related to dilaudid (will decrease dose, trial tylenol and lidocaine patch if able) Delirium precautions TSH , B12, folate all wnl  Community Acquired Pneumonia  Acute on Chronic Hypoxic Respiratory Failure  COPD Currently on 3-8 L [place] (typically on 2-3 L at home)  (was on venturi mask overnight - ? Related to dilaudid) Hazy bibasilar airspace opacities on CXR Repeat CXR pending  Follow BNP Urine strep, sputum cx Negative covid, flu, RSV Blood cultures pending Ceftriaxone/azithromycin No wheezing, no need for steroids Wean O2 as tolerated  Concern for Melena Hold ASA Type and screen, repeat Hb/Hct (stable argues against Kayloni Rocco hemodynamically significant bleed) PPI BID Trend H/H  With respiratory illness, not great candidate for procedure at this time, will hold off on GI consult for now, but if recurrent episode or dropping H/H, will consult GI   Ureteritis  Staghorn Calculus  On abx as above Follow UA/reflex culture pending collection On abx for pneumonia above Per admitting provider, urology rec abx prn and outpatient follow up  Constipation Miralax S/p enema with good response  Hypertension Not on BP meds BP ok  Anemia  Iron Def Downtrend, suspect component of dilution B12 wnl, folate wnl Evidence of iron def - supplemental iron   Left Lateral Abdominal Wall Hernia with [name] loop of proximal sigmoid colon Fat containing Supraumbilical Hernias No findings of strangulation or obstruction Follow outpatient (discussed with surgery)  Air Fluid Levels in Nondilated Loops of Small Bowel ? Mild ileus, follow  Intrahepatic and Extrahepatic Biliary Dilatation Bilirubin, alk phos, and LFT's wnl Follow   Prior Sacroplasty and Prior Vertebral Augmentations Degenerative Arthropathy of Both HIps Scapholunate Ligament Tear, Hyperextension of MCP Joint of thumb and generative arthropathy and possible subluxation of First carpometacarpal articulation Follow outpatient      DVT prophylaxis: SCD Code Status: full Family Communication: none Disposition:   Status is: Inpatient Remains inpatient appropriate because: need for continued inpatient management   Consultants:  none  Procedures:  none  Antimicrobials:  Anti-infectives  (From admission, onward)    Start     Dose/Rate Route Frequency Ordered Stop   05/15/23 1600  azithromycin (ZITHROMAX) 500 mg in sodium chloride 0.9 % 250 mL IVPB        500 mg 250 mL/hr over 60 Minutes Intravenous Every 24 hours 05/14/23 1453 05/19/23 1559   05/15/23 1100  cefTRIAXone (ROCEPHIN) 1 g in sodium chloride 0.9 % 100 mL IVPB        1 g 200 mL/hr over 30 Minutes Intravenous Every 24 hours 05/14/23 1453 05/19/23 1059   05/14/23 1315  azithromycin (ZITHROMAX) 500 mg in sodium chloride 0.9 % 250 mL IVPB        500 mg 250 mL/hr over 60 Minutes Intravenous  Once 05/14/23 1313 05/14/23 1722   05/14/23 1115  cefTRIAXone (ROCEPHIN) 1 g in sodium chloride 0.9 % 100 mL IVPB        1 g 200 mL/hr over 30 Minutes Intravenous  Once 05/14/23 1110 05/14/23 1312       Subjective: Lethargic   Objective: Vitals:   05/17/23 0438 05/17/23 0544 05/17/23 0843 05/17/23 0917  BP: 121/66     Pulse: 94     Resp:      Temp: 99.2 F (37.3 C)     TempSrc: Axillary     SpO2: 99% 94% 99% 99%  Weight:  44.5 kg    Height:  5' (1.524 m)      Intake/Output  Summary (Last 24 hours) at 05/17/2023 1009 Last data filed at 05/17/2023 0739 Gross per 24 hour  Intake 1481.98 ml  Output 100 ml  Net 1381.98 ml   Filed Weights   05/17/23 0544  Weight: 44.5 kg    Examination:  General: No acute distress. Cardiovascular: RRR Lungs: diminished Abdomen: Soft, nontender, nondistended  Neurological: more lethargic today Extremities: No clubbing or cyanosis. No edema.    Data Reviewed: I have personally reviewed following labs and imaging studies  CBC: Recent Labs  Lab 05/14/23 0628 05/15/23 0448 05/16/23 0401 05/17/23 0417 05/17/23 0902  WBC 15.2* 13.1* 9.0 7.9  --   NEUTROABS 13.9*  --  7.6 6.4  --   HGB 11.9* 9.8* 9.4* 9.3* 9.3*  HCT 38.5 32.5* 32.1* 31.1* 32.1*  MCV 91.9 95.6 96.4 96.6  --   PLT 342 244 287 245  --     Basic Metabolic Panel: Recent Labs  Lab 05/14/23 0628  05/15/23 0448 05/16/23 0401 05/17/23 0417  NA 134* 136 140 141  K 4.0 4.0 4.2 3.9  CL 94* 103 104 106  CO2 28 24 28 29   GLUCOSE 80 114* 195* 154*  BUN 26* 20 18 15   CREATININE 0.84 0.67 0.70 0.80  CALCIUM 8.5* 7.8* 8.1* 8.0*  MG  --   --  2.7* 2.4  PHOS  --   --  3.0 2.8    GFR: Estimated Creatinine Clearance: 36.8 mL/min (by C-G formula based on SCr of 0.8 mg/dL).  Liver Function Tests: Recent Labs  Lab 05/14/23 0628 05/15/23 0448 05/16/23 0401 05/17/23 0417  AST 10* 15 14* 49*  ALT 7 10 9 28   ALKPHOS 59 49 54 51  BILITOT 1.3* 0.6 0.5 0.3  PROT 8.0 6.2* 6.2* 6.2*  ALBUMIN 3.4* 2.6* 2.5* 2.5*    CBG: No results for input(s): "GLUCAP" in the last 168 hours.   Recent Results (from the past 240 hour(s))  Blood culture (routine x 2)     Status: None (Preliminary result)   Collection Time: 05/14/23  2:55 PM   Specimen: BLOOD  Result Value Ref Range Status   Specimen Description   Final    BLOOD SITE NOT SPECIFIED Performed at [facility], [address] [place], [place] [address]    Special Requests   Final    BOTTLES DRAWN AEROBIC AND ANAEROBIC Blood Culture adequate volume Performed at [facility], [address] [place], [place] [address]    Culture   Final    NO GROWTH 3 DAYS Performed at [facility], [address], [place], [place] [address]    Report Status PENDING  Incomplete  Blood culture (routine x 2)     Status: None (Preliminary result)   Collection Time: 05/14/23  3:05 PM   Specimen: BLOOD  Result Value Ref Range Status   Specimen Description   Final    BLOOD SITE NOT SPECIFIED Performed at [facility], [address] [place], [place] [address]    Special Requests   Final    BOTTLES DRAWN AEROBIC ONLY Blood Culture results may not be optimal due to an inadequate volume of blood received in culture bottles Performed at [facility], [address]  [place], [place] [address]    Culture   Final    NO GROWTH 3 DAYS Performed at [facility], [address] [place], [place] [address]    Report Status PENDING  Incomplete  Radiology Studies: No results found.      Scheduled Meds:  acetaminophen  1,000 mg Oral Q8H   clotrimazole   Topical BID   DULoxetine  60 mg Oral Daily   ferrous gluconate  324 mg Oral QODAY   ipratropium-albuterol  3 mL Nebulization BID   lidocaine  1 patch Transdermal Q24H   pantoprazole (PROTONIX) IV  40 mg Intravenous Q12H   polyethylene glycol  17 g Oral TID   senna-docusate  1 tablet Oral BID   thiamine  100 mg Oral Daily   Continuous Infusions:  azithromycin Stopped (05/16/23 1625)   cefTRIAXone (ROCEPHIN)  IV Stopped (05/16/23 1221)   dextrose 5% lactated ringers 50 mL/hr at 05/17/23 0739     LOS: 3 days    Time spent: over 30 min    Lacretia Nicks, MD [facility]   To contact the attending provider between 7A-7P or the covering provider during after hours 7P-7A, please log into the web site [email or web address] and access using universal [place] password for that web site. If you do not have the password, please call the hospital operator.  05/17/2023, 10:09 AM

## 2023-05-17 NOTE — TOC Initial Note (Signed)
Transition of Care [facility]) - Initial/Assessment Note    Patient Details  Name: [name] MRN: [id number] Date of Birth: 1936-06-03  Transition of [facility]) CM/SW Contact:    Larrie Kass, LCSW Phone Number: 05/17/2023, 10:46 AM  Clinical Narrative:                 CSW spoke with pt's son to discuss SNF placement. Pt's son stated he would like CSW to talk with his mother to see what she thinks.   CSW spoke with pt who was lethargic, pt stated to talk with her son. CSW attempted to contact pt's son again to discuss rec, no answer unable to leave VM. CSW will continue to attempt to contact pt's son to discuss SNF rec.  TOC to follow.    Expected Discharge Plan:  (TBD) Barriers to Discharge: Continued Medical Work up   Patient Goals and CMS Choice            Expected Discharge Plan and Services                                              Prior Living Arrangements/Services     Patient language and need for interpreter reviewed:: Yes        Need for Family Participation in Patient Care: Yes (Comment) Care giver support system in place?: Yes (comment)   Criminal Activity/Legal Involvement Pertinent to Current Situation/Hospitalization: No - Comment as needed  Activities of Daily Living Home Assistive Devices/Equipment: Walker (specify type) ADL Screening (condition at time of admission) Patient's cognitive ability adequate to safely complete daily activities?: Yes Is the patient deaf or have difficulty hearing?: Yes Does the patient have difficulty seeing, even when wearing glasses/contacts?: No Does the patient have difficulty concentrating, remembering, or making decisions?: No Patient able to express need for assistance with ADLs?: Yes Does the patient have difficulty dressing or bathing?: No Independently performs ADLs?: No Communication: Needs assistance Is this a change from baseline?: Change from baseline, expected to last <3  days Dressing (OT): Needs assistance Is this a change from baseline?: Change from baseline, expected to last <3days Grooming: Needs assistance Is this a change from baseline?: Change from baseline, expected to last <3 days Feeding: Independent Bathing: Needs assistance Is this a change from baseline?: Change from baseline, expected to last <3 days Toileting: Needs assistance Is this a change from baseline?: Change from baseline, expected to last <3 days In/Out Bed: Needs assistance Is this a change from baseline?: Pre-admission baseline Walks in Home: Needs assistance Is this a change from baseline?: Pre-admission baseline Does the patient have difficulty walking or climbing stairs?: Yes Weakness of Legs: Both Weakness of Arms/Hands: Both  Permission Sought/Granted                  Emotional Assessment Appearance:: Appears stated age Attitude/Demeanor/Rapport: Unable to Assess Affect (typically observed): Unable to Assess Orientation: : Oriented to Self      Admission diagnosis:  Ureteritis [N28.89] Staghorn calculus [N20.0] Acute respiratory failure with hypoxia (HCC) [J96.01] Urinary tract infection with hematuria, site unspecified [N39.0, R31.9] Community acquired pneumonia, unspecified laterality [J18.9] Patient Active Problem List   Diagnosis Date Noted   Acute on chronic respiratory failure with hypoxia (HCC) 05/14/2023   Constipation 05/14/2023   Ureteritis 05/14/2023   Staghorn calculus 05/14/2023   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  Malnutrition of moderate degree 09/10/2021   Microcytic anemia 09/09/2021   GIB (gastrointestinal bleeding) 04/15/2020   Fecal impaction in rectum (HCC)    Thrombocytosis 03/16/2020   Pressure injury of skin 03/16/2020   Left knee pain 06/18/2019   Upper GI bleed 06/17/2019   Wrist arthritis 05/28/2019   Acute blood loss anemia    Symptomatic anemia    Supplemental oxygen dependent    Right pontine stroke (HCC) 05/02/2019    Stroke (HCC) 05/01/2019   Acute renal failure superimposed on stage 3 chronic kidney disease (HCC) 05/01/2019   Acute ischemic stroke (HCC) 05/01/2019   AKI (acute kidney injury) (HCC) 08/29/2018   Encephalopathy acute 08/22/2018   Chronic back pain 08/22/2018   Vitamin B12 deficiency 08/19/2018   Dehydration    Tachycardia    Altered mental state 08/18/2018   Closed compression fracture of L1 vertebra (HCC)    Acute cystitis without hematuria    Pain management    Intractable pain    Back pain 10/10/2016   L2 vertebral fracture (HCC) 10/10/2016   Acute encephalopathy 12/25/2015   Drug-induced delirium 12/25/2015   Acute on chronic respiratory failure (HCC) 12/25/2015   CAP (community acquired pneumonia) 12/19/2015   Chronic respiratory failure (HCC) 03/19/2014   Depression 03/19/2014   Aspiration pneumonia (HCC) 03/10/2014   Acute respiratory failure with hypoxia (HCC) 03/10/2014   Sepsis (HCC) 03/10/2014   Hypokalemia 03/10/2014   Hypertension 03/10/2014   COPD (chronic obstructive pulmonary disease) (HCC) 03/10/2014   Tobacco abuse 03/10/2014   Normocytic anemia 06/15/2013   Generalized abdominal pain 06/15/2013   Hematochezia 06/15/2013   PCP:  Annita Brod, MD Pharmacy:   [id number] [place], [place] - [address] AT [facility] [address] [place] [address] Phone: [phone] Fax: [phone]     Social Determinants of Health (SDOH) Social History: SDOH Screenings   Food Insecurity: No Food Insecurity (05/14/2023)  Housing: Low Risk  (05/14/2023)  Transportation Needs: No Transportation Needs (05/14/2023)  Utilities: Not At Risk (05/14/2023)  Depression (PHQ2-9): Low Risk  (06/08/2019)  Tobacco Use: Medium Risk (05/14/2023)   SDOH Interventions:     Readmission Risk Interventions    02/16/2022   11:24 AM  Readmission Risk Prevention Plan  Post Dischage Appt Complete  Medication Screening Complete   Transportation Screening Complete

## 2023-05-17 NOTE — Progress Notes (Signed)
Switched pt over to MX from tele box, call TELE tech & confirmed ability to see pt's readings. Both IV site dressings were removed, catheters repositioned, obtained + bld return in each & secured both with new dressings & loosely attached small ACE wraps. Pt screamed & fought staff for entire time. She is now lying comfortably on left side with first bottle of albumin infusing LUE.

## 2023-05-17 NOTE — Progress Notes (Addendum)
Patient's o2 saturation had been fluctuating ranging from 85-90. The patient is on nasal cannula and is mouth breather. Respiratory therapist was informed and hooked the patient to venturi mask at 8liters and 02 went up to 98%. This nurse will continue to monitor this patient.

## 2023-05-17 NOTE — Progress Notes (Signed)
SLP Cancellation Note  Patient Details Name: [name] MRN: [id number] DOB: Apr 24, 1936   Cancelled treatment:       Reason Eval/Treat Not Completed: Patient's level of consciousness. Patient opens eyes minimally to verbal and tactile stimulation, turns head away and keeps lips closed when SLP attempting oral care. Not appropriate for PO's or MBS at this time. SLP will plan to follow up later this date.   Angela Nevin, MA, CCC-SLP Speech Therapy

## 2023-05-17 NOTE — Progress Notes (Signed)
Night shift RN reported pt's O2 sats dropped to approx 85% on 5.5 L. Last pm. Nurse & RT placed pt on 8 L Venturi mask. Pt recovered & able to decrease to 6L vent mask & pt maintained O2.  Also of note, pt's stool was very large last pm. Stated no visible blood with smear, but was very black & smelled of blood.   Reporting to Dr. Lowell Guitar above.

## 2023-05-18 ENCOUNTER — Inpatient Hospital Stay (HOSPITAL_COMMUNITY): Payer: Medicare Other

## 2023-05-18 DIAGNOSIS — J9621 Acute and chronic respiratory failure with hypoxia: Secondary | ICD-10-CM | POA: Diagnosis not present

## 2023-05-18 DIAGNOSIS — R008 Other abnormalities of heart beat: Secondary | ICD-10-CM | POA: Diagnosis not present

## 2023-05-18 LAB — COMPREHENSIVE METABOLIC PANEL
ALT: 20 U/L (ref 0–44)
AST: 20 U/L (ref 15–41)
Albumin: 2.9 g/dL — ABNORMAL LOW (ref 3.5–5.0)
Alkaline Phosphatase: 44 U/L (ref 38–126)
Anion gap: 11 (ref 5–15)
BUN: 10 mg/dL (ref 8–23)
CO2: 36 mmol/L — ABNORMAL HIGH (ref 22–32)
Calcium: 7.7 mg/dL — ABNORMAL LOW (ref 8.9–10.3)
Chloride: 94 mmol/L — ABNORMAL LOW (ref 98–111)
Creatinine, Ser: 0.66 mg/dL (ref 0.44–1.00)
GFR, Estimated: >60 mL/min (ref >60)
Glucose, Bld: 102 mg/dL — ABNORMAL HIGH (ref 70–99)
Potassium: 3.2 mmol/L — ABNORMAL LOW (ref 3.5–5.1)
Sodium: 141 mmol/L (ref 135–145)
Total Bilirubin: 0.6 mg/dL (ref 0.3–1.2)
Total Protein: 6.3 g/dL — ABNORMAL LOW (ref 6.5–8.1)

## 2023-05-18 LAB — ECHOCARDIOGRAM COMPLETE
AR max vel: 1.87 cm2
AV Area VTI: 1.57 cm2
AV Area mean vel: 1.59 cm2
AV Mean grad: 4 mmHg
AV Peak grad: 7.4 mmHg
Ao pk vel: 1.36 m/s
Area-P 1/2: 3.17 cm2
Height: 60 in
S' Lateral: 1.7 cm
Weight: 1568 oz

## 2023-05-18 LAB — CBC WITH DIFFERENTIAL/PLATELET
Abs Immature Granulocytes: 0.07 10*3/uL (ref 0.00–0.07)
Basophils Absolute: 0 10*3/uL (ref 0.0–0.1)
Basophils Relative: 0 %
Eosinophils Absolute: 0 10*3/uL (ref 0.0–0.5)
Eosinophils Relative: 0 %
HCT: 31.2 % — ABNORMAL LOW (ref 36.0–46.0)
Hemoglobin: 9.2 g/dL — ABNORMAL LOW (ref 12.0–15.0)
Immature Granulocytes: 1 %
Lymphocytes Relative: 8 %
Lymphs Abs: 0.6 10*3/uL — ABNORMAL LOW (ref 0.7–4.0)
MCH: 28.3 pg (ref 26.0–34.0)
MCHC: 29.5 g/dL — ABNORMAL LOW (ref 30.0–36.0)
MCV: 96 fL (ref 80.0–100.0)
Monocytes Absolute: 0.6 10*3/uL (ref 0.1–1.0)
Monocytes Relative: 8 %
Neutro Abs: 5.9 10*3/uL (ref 1.7–7.7)
Neutrophils Relative %: 83 %
Platelets: 253 10*3/uL (ref 150–400)
RBC: 3.25 MIL/uL — ABNORMAL LOW (ref 3.87–5.11)
RDW: 13.8 % (ref 11.5–15.5)
WBC: 7.1 10*3/uL (ref 4.0–10.5)
nRBC: 0 % (ref 0.0–0.2)

## 2023-05-18 LAB — MAGNESIUM: Magnesium: 1.8 mg/dL (ref 1.7–2.4)

## 2023-05-18 LAB — PHOSPHORUS: Phosphorus: 3 mg/dL (ref 2.5–4.6)

## 2023-05-18 LAB — CULTURE, BLOOD (ROUTINE X 2): Culture: NO GROWTH

## 2023-05-18 MED ORDER — POTASSIUM CHLORIDE CRYS ER 20 MEQ PO TBCR
40.0000 meq | EXTENDED_RELEASE_TABLET | ORAL | Status: AC
  Filled 2023-05-18: qty 2

## 2023-05-18 MED ORDER — SODIUM CHLORIDE 0.9 % IV SOLN
1.0000 g | INTRAVENOUS | Status: AC
  Administered 2023-05-19 – 2023-05-20 (×2): 1 g via INTRAVENOUS
  Filled 2023-05-18 (×2): qty 10

## 2023-05-18 MED ORDER — IOHEXOL 350 MG/ML SOLN
75.0000 mL | Freq: Once | INTRAVENOUS | Status: AC | PRN
  Administered 2023-05-18: 75 mL via INTRAVENOUS

## 2023-05-18 MED ORDER — VITAMIN D 25 MCG (1000 UNIT) PO TABS
1000.0000 [IU] | ORAL_TABLET | Freq: Every day | ORAL | Status: DC
  Administered 2023-05-18 – 2023-05-22 (×4): 1000 [IU] via ORAL
  Filled 2023-05-18 (×5): qty 1

## 2023-05-18 MED ORDER — CALCIUM CARBONATE ANTACID 500 MG PO CHEW
400.0000 mg | CHEWABLE_TABLET | Freq: Two times a day (BID) | ORAL | Status: DC
  Administered 2023-05-18 – 2023-05-21 (×3): 400 mg via ORAL
  Filled 2023-05-18 (×7): qty 2

## 2023-05-18 MED ORDER — OXYCODONE HCL 5 MG PO TABS
2.5000 mg | ORAL_TABLET | ORAL | Status: DC | PRN
  Administered 2023-05-19 – 2023-05-22 (×6): 2.5 mg via ORAL
  Filled 2023-05-18 (×7): qty 1

## 2023-05-18 MED ORDER — POTASSIUM CHLORIDE CRYS ER 20 MEQ PO TBCR: 40.0000 meq | EXTENDED_RELEASE_TABLET | ORAL | Status: DC

## 2023-05-18 MED ORDER — LIDOCAINE 5 % EX PTCH: 3.0000 | MEDICATED_PATCH | CUTANEOUS | Status: DC

## 2023-05-18 MED ORDER — LIDOCAINE 5 % EX PTCH
3.0000 | MEDICATED_PATCH | CUTANEOUS | Status: DC
  Administered 2023-05-19 – 2023-05-22 (×4): 3 via TRANSDERMAL
  Filled 2023-05-18 (×5): qty 3

## 2023-05-18 MED ORDER — CALCITONIN (SALMON) 200 UNIT/ACT NA SOLN
1.0000 | Freq: Every day | NASAL | Status: DC
  Administered 2023-05-18 – 2023-05-22 (×4): 1 via NASAL
  Filled 2023-05-18: qty 3.7

## 2023-05-18 NOTE — Plan of Care (Signed)

## 2023-05-18 NOTE — Care Management Important Message (Signed)
Important Message  Patient Details IM Letter given. Name: [name] MRN: [id number] Date of Birth: 06/11/1936   Medicare Important Message Given:  Yes     Caren Macadam 05/18/2023, 9:55 AM

## 2023-05-18 NOTE — Progress Notes (Signed)
PROGRESS NOTE    [name]  ZOX:[id number] DOB: 10-22-1936 DOA: 05/14/2023 PCP: Annita Brod, MD  Chief Complaint  Patient presents with   Constipation    Brief Narrative:   [name] is [name] 87 y.o. female with medical history significant of osteoarthritis, chronic back pain, history of colitis, lumbar vertebrae compression fracture, sacral fracture, COPD, delirium, hypertension, osteoporosis, history of multiple episodes of pneumonia who presented to the emergency department complaints of constipation associated with LLQ pain.    She had CXR concerning for pneumonia and she was admitted for acute on chronic hypoxic respiratory failure due to CAP.  Hospitalization c/b delirium.  Echo with right sided HF.    See below for additional details   Assessment & Plan:   Principal Problem:   Acute on chronic respiratory failure with hypoxia (HCC) Active Problems:   Normocytic anemia   Hypertension   COPD (chronic obstructive pulmonary disease) (HCC)   Depression   CAP (community acquired pneumonia)   Constipation   Ureteritis   Staghorn calculus  Goals of Care Discussed with son over phone 5/20, he notes she'd want full code.  Declined palliative care involvement at this time.  Will continue to discuss.   Acute Metabolic Encephalopathy Waxing, waning Delirium precautions TSH , B12, folate all wnl  Community Acquired Pneumonia  Strep Pneumoniae Pneumonia  Acute on Chronic Hypoxic Respiratory Failure  COPD  Currently on 3 [place] (typically on 2-3 L at home) CT chest today with R>L effusions with increasing dependent consolidation in both lower lobes (likely atelectasis though aspiration not excluded) Urine strep (positive), sputum cx (pending collection) Negative covid, flu, RSV Blood cultures NGx4 Ceftriaxone/azithromycin, will continue ceftriaxone x7 days No wheezing, no need for steroids Wean O2 as tolerated  Bilateral Pleural Effusions  Right Sided Heart Failure   Elevated PASP Echo with moderately reduced RVSF, severely enlarged RV, mildly elevated PASP.  Grade 1 diastolic dysfunction. IVC dilated with <50% resp variability CT PE protocol negative for PE Will continue diuresis Consult cardiology  Severe Compression at T7 She's been c/o back pain Continue scheduled apap, low dose oxy prn.  Will add calcitonin.  Lidocaine patch. Follow with therapy  Concern for Melena Hold ASA Type and screen, repeat Hb/Hct (stable argues against Ollie Esty hemodynamically significant bleed) PPI BID Trend H/H  With respiratory illness, not great candidate for procedure at this time, will hold off on GI consult for now, but if recurrent episode or dropping H/H, will consult GI   Ureteritis  Staghorn Calculus  On abx as above Follow UA/reflex culture pending collection On abx for pneumonia above Per admitting provider, urology rec abx prn and outpatient follow up  Constipation Miralax S/p enema with good response  Hypertension Not on BP meds BP ok  Anemia  Iron Def Downtrend, suspect component of dilution B12 wnl, folate wnl Evidence of iron def - supplemental iron   Left Lateral Abdominal Wall Hernia with Sadiq Mccauley loop of proximal sigmoid colon Fat containing Supraumbilical Hernias No findings of strangulation or obstruction Follow outpatient (discussed with surgery)  Air Fluid Levels in Nondilated Loops of Small Bowel ? Mild ileus, follow  Intrahepatic and Extrahepatic Biliary Dilatation Bilirubin, alk phos, and LFT's wnl Follow   Prior Sacroplasty and Prior Vertebral Augmentations Degenerative Arthropathy of Both HIps Scapholunate Ligament Tear, Hyperextension of MCP Joint of thumb and generative arthropathy and possible subluxation of First carpometacarpal articulation Follow outpatient      DVT prophylaxis: SCD Code Status: full Family Communication: none  Disposition:   Status is: Inpatient Remains inpatient appropriate because: need for  continued inpatient management   Consultants:  none  Procedures:  none  Antimicrobials:  Anti-infectives (From admission, onward)    Start     Dose/Rate Route Frequency Ordered Stop   05/15/23 1600  azithromycin (ZITHROMAX) 500 mg in sodium chloride 0.9 % 250 mL IVPB        500 mg 250 mL/hr over 60 Minutes Intravenous Every 24 hours 05/14/23 1453 05/19/23 1559   05/15/23 1100  cefTRIAXone (ROCEPHIN) 1 g in sodium chloride 0.9 % 100 mL IVPB        1 g 200 mL/hr over 30 Minutes Intravenous Every 24 hours 05/14/23 1453 05/19/23 1059   05/14/23 1315  azithromycin (ZITHROMAX) 500 mg in sodium chloride 0.9 % 250 mL IVPB        500 mg 250 mL/hr over 60 Minutes Intravenous  Once 05/14/23 1313 05/14/23 1722   05/14/23 1115  cefTRIAXone (ROCEPHIN) 1 g in sodium chloride 0.9 % 100 mL IVPB        1 g 200 mL/hr over 30 Minutes Intravenous  Once 05/14/23 1110 05/14/23 1312       Subjective: C/o back pain, better after pain meds  Objective: Vitals:   05/17/23 0438 05/17/23 0544 05/17/23 0843 05/17/23 0917  BP: 121/66     Pulse: 94     Resp:      Temp: 99.2 F (37.3 C)     TempSrc: Axillary     SpO2: 99% 94% 99% 99%  Weight:  44.5 kg    Height:  5' (1.524 m)      Intake/Output Summary (Last 24 hours) at 05/17/2023 1009 Last data filed at 05/17/2023 0739 Gross per 24 hour  Intake 1481.98 ml  Output 100 ml  Net 1381.98 ml   Filed Weights   05/17/23 0544  Weight: 44.5 kg    Examination:  General: No acute distress. Cardiovascular: RRR Lungs: unlabored Abdomen: Soft, nontender, nondistended  Neurological: Alert and oriented 3. Moves all extremities 4 with equal strength. Cranial nerves II through XII grossly intact.. Extremities: No clubbing or cyanosis. No edema.   Data Reviewed: I have personally reviewed following labs and imaging studies  CBC: Recent Labs  Lab 05/14/23 0628 05/15/23 0448 05/16/23 0401 05/17/23 0417 05/17/23 0902  WBC 15.2* 13.1* 9.0 7.9  --    NEUTROABS 13.9*  --  7.6 6.4  --   HGB 11.9* 9.8* 9.4* 9.3* 9.3*  HCT 38.5 32.5* 32.1* 31.1* 32.1*  MCV 91.9 95.6 96.4 96.6  --   PLT 342 244 287 245  --     Basic Metabolic Panel: Recent Labs  Lab 05/14/23 0628 05/15/23 0448 05/16/23 0401 05/17/23 0417  NA 134* 136 140 141  K 4.0 4.0 4.2 3.9  CL 94* 103 104 106  CO2 28 24 28 29   GLUCOSE 80 114* 195* 154*  BUN 26* 20 18 15   CREATININE 0.84 0.67 0.70 0.80  CALCIUM 8.5* 7.8* 8.1* 8.0*  MG  --   --  2.7* 2.4  PHOS  --   --  3.0 2.8    GFR: Estimated Creatinine Clearance: 36.8 mL/min (by C-G formula based on SCr of 0.8 mg/dL).  Liver Function Tests: Recent Labs  Lab 05/14/23 0628 05/15/23 0448 05/16/23 0401 05/17/23 0417  AST 10* 15 14* 49*  ALT 7 10 9 28   ALKPHOS 59 49 54 51  BILITOT 1.3* 0.6 0.5 0.3  PROT 8.0 6.2* 6.2*  6.2*  ALBUMIN 3.4* 2.6* 2.5* 2.5*    CBG: No results for input(s): "GLUCAP" in the last 168 hours.   Recent Results (from the past 240 hour(s))  Blood culture (routine x 2)     Status: None (Preliminary result)   Collection Time: 05/14/23  2:55 PM   Specimen: BLOOD  Result Value Ref Range Status   Specimen Description   Final    BLOOD SITE NOT SPECIFIED Performed at [facility], [address], [place], [place] [address]    Special Requests   Final    BOTTLES DRAWN AEROBIC AND ANAEROBIC Blood Culture adequate volume Performed at [facility], [address], [place], [place] [address]    Culture   Final    NO GROWTH 3 DAYS Performed at [facility], [address], [place], [place] [address]    Report Status PENDING  Incomplete  Blood culture (routine x 2)     Status: None (Preliminary result)   Collection Time: 05/14/23  3:05 PM   Specimen: BLOOD  Result Value Ref Range Status   Specimen Description   Final    BLOOD SITE NOT SPECIFIED Performed at [facility], [address], [place], [place] [address]    Special  Requests   Final    BOTTLES DRAWN AEROBIC ONLY Blood Culture results may not be optimal due to an inadequate volume of blood received in culture bottles Performed at [facility], [address] [place], [place] [address]    Culture   Final    NO GROWTH 3 DAYS Performed at [facility], [address], [place], [place] [address]    Report Status PENDING  Incomplete         Radiology Studies: No results found.      Scheduled Meds:  acetaminophen  1,000 mg Oral Q8H   clotrimazole   Topical BID   DULoxetine  60 mg Oral Daily   ferrous gluconate  324 mg Oral QODAY   ipratropium-albuterol  3 mL Nebulization BID   lidocaine  1 patch Transdermal Q24H   pantoprazole (PROTONIX) IV  40 mg Intravenous Q12H   polyethylene glycol  17 g Oral TID   senna-docusate  1 tablet Oral BID   thiamine  100 mg Oral Daily   Continuous Infusions:  azithromycin Stopped (05/16/23 1625)   cefTRIAXone (ROCEPHIN)  IV Stopped (05/16/23 1221)   dextrose 5% lactated ringers 50 mL/hr at 05/17/23 0739     LOS: 3 days    Time spent: over 30 min    Lacretia Nicks, MD [facility]   To contact the attending provider between 7A-7P or the covering provider during after hours 7P-7A, please log into the web site [email or web address] and access using universal [place] password for that web site. If you do not have the password, please call the hospital operator.  05/17/2023, 10:09 AM

## 2023-05-18 NOTE — Progress Notes (Signed)
Echocardiogram 2D Echocardiogram has been performed.  [name] 05/18/2023, 9:13 AM

## 2023-05-18 NOTE — TOC Progression Note (Addendum)
Transition of Care [facility]) - Progression Note    Patient Details  Name: [name] MRN: [id number] Date of Birth: [birth year]  [facility]) CM/SW Contact  Larrie Kass, LCSW Phone Number: 05/18/2023, 10:11 AM  Clinical Narrative:     CSW spoke with pt's son, he reports pt has expressed she does not want SNF. He reports he thinks she would benefit from going, however, she is scared she will have to stay long-term. Pt's son reports pt lives with him and he can help her if d/c home. Pt's son stated pt had home health in the past. He reports he will talk with pt again to convince her to go to SNF. TOC to follow.  Adden  3:47pm  CSW spoke with pt's son, he stated his mother would like to come home. Pt's son has declined SNF placement. HH PT, OT, Adie, and SW were arranged through [employer]. Will need HH orders, MD made aware. TOC to follow.     Expected Discharge Plan:  (TBD) Barriers to Discharge: Continued Medical Work up  Expected Discharge Plan and Services                                               Social Determinants of Health (SDOH) Interventions SDOH Screenings   Food Insecurity: No Food Insecurity (05/14/2023)  Housing: Low Risk  (05/14/2023)  Transportation Needs: No Transportation Needs (05/14/2023)  Utilities: Not At Risk (05/14/2023)  Depression (PHQ2-9): Low Risk  (06/08/2019)  Tobacco Use: Medium Risk (05/14/2023)    Readmission Risk Interventions    02/16/2022   11:24 AM  Readmission Risk Prevention Plan  Post Dischage Appt Complete  Medication Screening Complete  Transportation Screening Complete

## 2023-05-18 NOTE — Progress Notes (Signed)
Occupational Therapy Treatment Patient Details Name: [name] MRN: [id number] DOB: 08-25-1936 Today's Date: 05/18/2023   History of present illness [name] is a 87 y.o. female with medical history significant of osteoarthritis, chronic back pain, history of colitis, lumbar vertebrae compression fracture, sacral fracture, COPD, delirium, hypertension, osteoporosis, history of multiple episodes of pneumonia who presented to the emergency department complaints of constipation associated with LLQ pain. Patient admitted for acute on chronic respiratory failure seconday to pneumonia.   OT comments  Pt was noted to be with deconditioning, generalized weakness, compromised endurance, impaired ADL performance, and impaired functional mobility. She required occasional re-orientation during the session, as she was disoriented to place, year, and situation, and she intermittently referenced nonsensical or tangential topics. She was assisted into sitting EOB, requiring occasional min assist for sitting balance & cues to correct posterior lean. Upon performing 2 sit to stand transfers using a RW, she was unable to lift and advance her BLE, in order to take lateral steps along the EOB. Without continued OT services, she is at risk for further weakness and deconditioning, as well as restricted ADL participation.    Recommendations for follow up therapy are one component of a multi-disciplinary discharge planning process, led by the attending physician.  Recommendations may be updated based on patient status, additional functional criteria and insurance authorization.    Assistance Recommended at Discharge Frequent or constant Supervision/Assistance  Patient can return home with the following  A lot of help with bathing/dressing/bathroom;Assistance with cooking/housework;A lot of help with walking and/or transfers;Direct supervision/assist for medications management   Equipment Recommendations  None  recommended by OT       Precautions / Restrictions Precautions Precautions: Fall Precaution Comments: monitor O2 Restrictions Weight Bearing Restrictions: No       Mobility Bed Mobility Overal bed mobility: Needs Assistance Bed Mobility: Supine to Sit, Rolling Rolling: Max assist   Supine to sit: Max assist Sit to supine: Mod assist, +2 for physical assistance   General bed mobility comments: Pt presented with delayed initiation, and subsequently required cues for transfer technique/sequencing, including advancing BLE and trunk positioning    Transfers Overall transfer level: Needs assistance Equipment used: Rolling walker (2 wheels) Transfers: Sit to/from Stand Sit to Stand: Mod assist, +2 safety/equipment Stand pivot transfers: Min assist, +2 safety/equipment         General transfer comment: Pt performed 2 sit to stand transfers from EOB using a RW for support. She required cues to bend her knees underneath her, as well as for hand placement & upright posture once in standing. She required a seated rest break between stands. In standing, she was unable to lift and advance her BLE, in order to take lateral steps along the EOB         ADL either performed or assessed with clinical judgement   ADL Overall ADL's : Needs assistance/impaired Eating/Feeding: Minimal assistance;Sitting Eating/Feeding Details (indicate cue type and reason): based on clinical judgement Grooming: Bed level;Minimal assistance           Upper Body Dressing : Moderate assistance;Bed level   Lower Body Dressing: Maximal assistance;Bed level       Toileting- Clothing Manipulation and Hygiene: Total assistance;Bed level               Vision Baseline Vision/History: 1 Wears glasses     Perception     Praxis      Cognition Arousal/Alertness: Awake/alert   Overall Cognitive Status: No family/caregiver present to  determine baseline cognitive functioning Area of Impairment:  Orientation, Awareness                 Orientation Level: Disoriented to, Time, Situation, Place           Problem Solving: Slow processing                     Pertinent Vitals/ Pain       Pain Assessment Pain Assessment: Faces Pain Score: 3  Pain Location: generalized with activity Pain Intervention(s): Limited activity within patient's tolerance, Repositioned, Monitored during session         Frequency  Min 1X/week        Progress Toward Goals  OT Goals(current goals can now be found in the care plan section)     Acute Rehab OT Goals Patient Stated Goal: she did not specifically state this session OT Goal Formulation: With patient Time For Goal Achievement: 05/30/23 Potential to Achieve Goals: Fair  Plan Discharge plan remains appropriate       AM-PAC OT "6 Clicks" Daily Activity     Outcome Measure   Help from another person eating meals?: A Little Help from another person taking care of personal grooming?: A Little Help from another person toileting, which includes using toliet, bedpan, or urinal?: Total Help from another person bathing (including washing, rinsing, drying)?: A Lot Help from another person to put on and taking off regular upper body clothing?: A Lot Help from another person to put on and taking off regular lower body clothing?: Total 6 Click Score: 12    End of Session Equipment Utilized During Treatment: Oxygen;Gait belt;Rolling walker (2 wheels)  OT Visit Diagnosis: Muscle weakness (generalized) (M62.81);Unsteadiness on feet (R26.81)   Activity Tolerance Patient limited by fatigue   Patient Left in bed;with call bell/phone within reach;with bed alarm set   Nurse Communication Mobility status        Time: 1610-9604 OT Time Calculation (min): 22 min  Charges: OT General Charges $OT Visit: 1 Visit OT Treatments $Therapeutic Activity: 8-22 mins     Reuben Likes, OTR/L 05/18/2023, 2:24 PM

## 2023-05-18 NOTE — Care Management Important Message (Signed)
Important Message  Patient Details IM Letter placed in Patient's room for family. Name: GAL CHMIEL MRN: [id number] Date of Birth: 07-Jan-1936   Medicare Important Message Given:  Yes     Caren Macadam 05/18/2023, 11:18 AM

## 2023-05-19 DIAGNOSIS — N200 Calculus of kidney: Secondary | ICD-10-CM | POA: Diagnosis not present

## 2023-05-19 DIAGNOSIS — J189 Pneumonia, unspecified organism: Secondary | ICD-10-CM | POA: Diagnosis not present

## 2023-05-19 DIAGNOSIS — K5900 Constipation, unspecified: Secondary | ICD-10-CM | POA: Diagnosis not present

## 2023-05-19 DIAGNOSIS — J9621 Acute and chronic respiratory failure with hypoxia: Secondary | ICD-10-CM | POA: Diagnosis not present

## 2023-05-19 LAB — CBC
HCT: 37.5 % (ref 36.0–46.0)
Hemoglobin: 11.2 g/dL — ABNORMAL LOW (ref 12.0–15.0)
MCH: 27.8 pg (ref 26.0–34.0)
MCHC: 29.9 g/dL — ABNORMAL LOW (ref 30.0–36.0)
MCV: 93.1 fL (ref 80.0–100.0)
Platelets: 378 10*3/uL (ref 150–400)
RBC: 4.03 MIL/uL (ref 3.87–5.11)
RDW: 13.6 % (ref 11.5–15.5)
WBC: 10.7 10*3/uL — ABNORMAL HIGH (ref 4.0–10.5)
nRBC: 0 % (ref 0.0–0.2)

## 2023-05-19 LAB — BASIC METABOLIC PANEL
Anion gap: 13 (ref 5–15)
BUN: 11 mg/dL (ref 8–23)
CO2: 44 mmol/L — ABNORMAL HIGH (ref 22–32)
Calcium: 8 mg/dL — ABNORMAL LOW (ref 8.9–10.3)
Chloride: 82 mmol/L — ABNORMAL LOW (ref 98–111)
Creatinine, Ser: 0.68 mg/dL (ref 0.44–1.00)
GFR, Estimated: >60 mL/min (ref >60)
Glucose, Bld: 104 mg/dL — ABNORMAL HIGH (ref 70–99)
Potassium: 3.2 mmol/L — ABNORMAL LOW (ref 3.5–5.1)
Sodium: 139 mmol/L (ref 135–145)

## 2023-05-19 LAB — CULTURE, BLOOD (ROUTINE X 2): Culture: NO GROWTH

## 2023-05-19 LAB — VITAMIN D 25 HYDROXY (VIT D DEFICIENCY, FRACTURES): Vit D, 25-Hydroxy: 4.32 ng/mL — ABNORMAL LOW (ref 30–100)

## 2023-05-19 MED ORDER — POTASSIUM CHLORIDE 20 MEQ PO PACK
40.0000 meq | PACK | Freq: Two times a day (BID) | ORAL | Status: DC
  Filled 2023-05-19: qty 2

## 2023-05-19 MED ORDER — POTASSIUM CHLORIDE 10 MEQ/100ML IV SOLN
10.0000 meq | INTRAVENOUS | Status: AC
  Administered 2023-05-19 (×4): 10 meq via INTRAVENOUS
  Filled 2023-05-19 (×4): qty 100

## 2023-05-19 NOTE — Progress Notes (Addendum)
Progress Note   Patient: [name] WRU:[id number] DOB: August 26, 1936 DOA: 05/14/2023     5 DOS: the patient was seen and examined on 05/19/2023   Brief hospital course: 86yow presented with constipation and left lower quadrant pain.  Admitted for acute on chronic respiratory failure with hypoxia, pneumonia.  Assessment and Plan: Acute on chronic respiratory failure with hypoxia (HCC) on 2 to 3 L at home Streptococcus pneumoniae pneumonia COPD (chronic obstructive pulmonary disease) (HCC) Now at baseline oxygen requirement.  COPD appears to be stable.  Continue antibiotics  New diagnosis right-sided systolic heart failure, chronic Pulmonary hypertension etiology unknown Based on 2D echocardiogram.  CT negative for PE. Seen by cardiology.  Medical management recommended.  Continue Lasix.  Follow BMP.   Acute metabolic encephalopathy Confused and paranoid presently. Workup unrevealing.  Ureteritis Staghorn calculus No urine culture was sent Follow-up with urology as an outpatient.  Renal function preserved.   Constipation Last bowel movement 5/21.  Continue bowel regimen.   Normocytic anemia Monitor hematocrit and hemoglobin.  Hemoglobin stable.   Abdominal wall hernia Follow-up with general surgery as an outpatient  Severe compression fracture at T7 Chronicity unclear.  Supportive care.  Chart records concern for melena.  The genesis for this concern is unclear from chart review.  Hemoglobin is stable.  I do not see any nursing notes of concern.  No fecal occult blood was ordered.  No further evaluation suggested.  Scapholunate Ligament Tear, Hyperextension of MCP Joint of thumb and generative arthropathy and possible subluxation of First carpometacarpal articulation Follow outpatient   Goals of care.  DNR is on file nevertheless apparently the son stated full CODE STATUS 5/20.  Palliative care was declined.  Pressure injury stage 1 sacrum present on admission (at the  time of the admission order)     Subjective:  Refusing meds per RN "I'm not answering that"  Physical Exam: Vitals:   05/19/23 1300 05/19/23 1400 05/19/23 1500 05/19/23 1600  BP: 105/61 110/61 (!) 92/54 (!) 96/52  Pulse: 83     Resp: 16     Temp: (!) 97.3 F (36.3 C)     TempSrc: Oral     SpO2: 97%     Weight:      Height:       Physical Exam Vitals reviewed.  Constitutional:      General: She is not in acute distress.    Appearance: She is not ill-appearing or toxic-appearing.  Cardiovascular:     Rate and Rhythm: Normal rate and regular rhythm.     Heart sounds: No murmur heard. Pulmonary:     Effort: Pulmonary effort is normal. No respiratory distress.     Breath sounds: No wheezing, rhonchi or rales.  Musculoskeletal:     Right lower leg: No edema.     Left lower leg: No edema.  Neurological:     Mental Status: She is alert.  Psychiatric:        Attention and Perception: Attention normal.        Mood and Affect: Affect is labile.        Speech: She is noncommunicative.        Behavior: Behavior is uncooperative and agitated.        Thought Content: Thought content is paranoid.        Cognition and Memory: Cognition is impaired.        Judgment: Judgment is inappropriate.     Data Reviewed: K+ 3.2 CBC noted  Family Communication:  Disposition: Status is: Inpatient  Planned Discharge Destination:  TBD    Time spent: 35 minutes  Author: Brendia Sacks, MD 05/19/2023 5:47 PM  For on call review [email or web address].

## 2023-05-19 NOTE — Progress Notes (Signed)
SLP Cancellation Note  Patient Details Name: [name] MRN: [id number] DOB: 03/22/1936   Cancelled treatment:       Reason Eval/Treat Not Completed: Other (comment) (pt not taking in any po per NT and is agitated this am)  Rolena Infante, MS [facility] SLP Acute Rehab Services Office [phone]   Chales Abrahams 05/19/2023, 1:01 PM

## 2023-05-19 NOTE — Progress Notes (Signed)
Physical Therapy Treatment Patient Details Name: [name] MRN: [id number] DOB: 1936/04/17 Today's Date: 05/19/2023   History of Present Illness [name] is a 87 y.o. female with medical history significant of osteoarthritis, chronic back pain, history of colitis, lumbar vertebrae compression fracture, sacral fracture, COPD, delirium, hypertension, osteoporosis, history of multiple episodes of pneumonia who presented to the emergency department complaints of constipation associated with LLQ pain. Patient admitted for acute on chronic respiratory failure seconday to pneumonia.    PT Comments    Pt awake, wanting to know where her son is, unsure of where she is (city or building), easily reoriented, needing increased time and cues with mobility. Pt educated on log rolling, needing total A for rolling to side then pushing up to sitting. Pt sits EOB for ~2 minutes with min-mod A, gripping to bed tightly with hands, requesting to return to supine. Pt perseverating on needing to pee, but declines transfer to [facility], assisted back to supine and scooted up in bed. Pt noted to be soiled in BM once scooted up in bed, notified NT. Pt on O2 with SpO2 96% with mobility.   Recommendations for follow up therapy are one component of a multi-disciplinary discharge planning process, led by the attending physician.  Recommendations may be updated based on patient status, additional functional criteria and insurance authorization.  Follow Up Recommendations  Can patient physically be transported by private vehicle: No    Assistance Recommended at Discharge Frequent or constant Supervision/Assistance  Patient can return home with the following A lot of help with walking and/or transfers;A lot of help with bathing/dressing/bathroom;Assist for transportation;Assistance with cooking/housework;Help with stairs or ramp for entrance;Direct supervision/assist for financial management   Equipment Recommendations   None recommended by PT    Recommendations for Other Services       Precautions / Restrictions Precautions Precautions: Fall Precaution Comments: monitor O2 Restrictions Weight Bearing Restrictions: No     Mobility  Bed Mobility Overal bed mobility: Needs Assistance  Rolling: Total assist  Supine to sit: Total assist Sit to supine: Total assist  General bed mobility comments: educated pt on log rolling, pt needing total A to roll to side then push up into sitting, mod A to sit EOB for ~2 minutes and total A to return to supine and scoot up in bed with use of bedpad    Transfers  General transfer comment: pt declines, perseverating on needing to pee once sitting EOB, attempted to educate pt on using BSC since pt reports using one at home but unable to encourage    Ambulation/Gait    Stairs             Wheelchair Mobility    Modified Rankin (Stroke Patients Only)       Balance Overall balance assessment: Needs assistance Sitting-balance support: Feet supported Sitting balance-Leahy Scale: Poor Sitting balance - Comments: mod A to sit EOB     Cognition Arousal/Alertness: Awake/alert Behavior During Therapy: WFL for tasks assessed/performed Overall Cognitive Status: No family/caregiver present to determine baseline cognitive functioning  General Comments: pt awake, wanting to know where her son is, perseverating on needing to pee, encouraged to use BSC but pt declines. Pt unaware she is in the hospital, reports "I'm not sure" regarding city and hospital        Exercises      General Comments        Pertinent Vitals/Pain Pain Assessment Pain Assessment: Faces Faces Pain Scale: Hurts little more Pain Location:  generalized with mobility Pain Descriptors / Indicators:  ("I hurt all over all the time") Pain Intervention(s): Limited activity within patient's tolerance, Monitored during session    Home Living                          Prior  Function            PT Goals (current goals can now be found in the care plan section) Acute Rehab PT Goals PT Goal Formulation: Patient unable to participate in goal setting Time For Goal Achievement: 05/30/23 Potential to Achieve Goals: Fair Progress towards PT goals: Progressing toward goals    Frequency    Min 1X/week      PT Plan Current plan remains appropriate    Co-evaluation              AM-PAC PT "6 Clicks" Mobility   Outcome Measure  Help needed turning from your back to your side while in a flat bed without using bedrails?: Total Help needed moving from lying on your back to sitting on the side of a flat bed without using bedrails?: Total Help needed moving to and from a bed to a chair (including a wheelchair)?: Total Help needed standing up from a chair using your arms (e.g., wheelchair or bedside chair)?: Total Help needed to walk in hospital room?: Total Help needed climbing 3-5 steps with a railing? : Total 6 Click Score: 6    End of Session Equipment Utilized During Treatment: Oxygen Activity Tolerance: Patient tolerated treatment well Patient left: in bed;with call bell/phone within reach;with bed alarm set Nurse Communication: Mobility status;Other (comment) (BM) PT Visit Diagnosis: Unsteadiness on feet (R26.81);Muscle weakness (generalized) (M62.81)     Time: 1610-9604 PT Time Calculation (min) (ACUTE ONLY): 11 min  Charges:  $Therapeutic Activity: 8-22 mins                      Tori Ruben Pyka PT, DPT 05/19/23, 12:12 PM

## 2023-05-19 NOTE — Progress Notes (Signed)
Attempted to give pt all her medications. Able to give IV meds, but absolutely refuses oral intake. Placed potassium powder in pt's favorite drink (lemon lime). Absolutely would not take even one sip or even offered water. States in one sentence, "I just want to die" altered with "you're trying to kill me" Unable to console pt. Stated "just leave me alone!" Informed Dr. Irene Limbo & Yvonna Alanis, PA with cardiology of refusals. Also spoke with Kathlene November (pt's son) updated him & expressed concern r/t pt's demeanor & lack of compliance with medications.

## 2023-05-19 NOTE — Hospital Course (Signed)
86yow presented with constipation and left lower quadrant pain.  Admitted for acute on chronic respiratory failure with hypoxia, pneumonia.

## 2023-05-19 NOTE — Consult Note (Addendum)
Cardiology Consultation   Patient ID: [name] MRN: [id number]; DOB: 02/23/1936  Admit date: 05/14/2023 Date of Consult: 05/19/2023  PCP:  Annita Brod, MD   [facility] Providers Cardiologist:  None   {   Patient Profile:   [name] is a 87 y.o. female with a hx of osteoarthritis, chronic back pain, history of colitis, lumbar vertebrae compression fracture, sacral fracture, COPD (chronically on 2-3L), delirium, hypertension, osteoporosis, history of multiple episodes of pneumonia , erosive esophagitis, chronic thrombocytosis (JAK2 genotyping) who is being seen 05/19/2023 for the evaluation of new onset of right sided heart failure at the request of Dr. Irene Limbo.  History of Present Illness:   [name] has no prior cardiac history.  She was admitted on 05/14/2023 for CAP with respiratory failure, but presented with complaints of constipation, poor oral intake left lower quadrant pain.  Per chart review, she reported that she has not had a bowel movement in 2 to 3 weeks.  Upon further evaluation chest x-ray revealed pulmonary infiltrates suggesting community-acquired pneumonia  (negative blood cultures x 4) and had small bilateral pleural effusions. Her urine was positive for strep.  She has a large staghorn calculus in the left renal collecting system and possible ureteritis and possible mild ileus.  She has been started on IV antibiotics.  There has also been some concern for melena, but has had stable H/H.   Upon further evaluation patient has had an abnormal echocardiogram that revealed new onset of moderately reduced RVSF with normal LVEF of 65-70%.  Severely enlarged RV, with mild elevated pulmonary artery systolic pressure. RVSP 41.4.  Severely dilated RA.  She had midsystolic notching of the pulmonary outflow tract suggesting severely elevated pulmonary vascular resistance. This is a significant reduction in RV function in comparison to a normal echocardiogram done  in May 2020. CTA negative for PE. She is currently being diuresed on IV lasix 40mg  BID.   I attempted to evaluate patient at the bedside however patient was extremely sluggish/tired and would not respond to any of my questions.  Nursing staff has also said that she has been very delirious, refuses medications, has had poor oral intake.  I asked directly if patient was refusing to see me and she muttered some inaudible words so given no consent, physical exam and interview was not performed.  I reached out to the son via phone and attempted to get HPI however he is also very poor historian and could not provide much detail about his mother's health, despite living with her.  He did state that for the past few weeks she has had some shortness of breath but had denied any symptoms of orthopnea, peripheral edema, shortness of breath.  It appears that she remains very sedentary in her bed and rarely ambulates and when she does its with a walker.  At home is also has had very poor oral intake.    Past Medical History:  Diagnosis Date   Arthritis    CAP (community acquired pneumonia)    multiple admissions   Chronic back pain 08/22/2018   Colitis 03/19/2014   Compression fx, lumbar spine (HCC)    COPD (chronic obstructive pulmonary disease) (HCC)    Delirium    Hypertension    On home oxygen therapy 06-14-13   continuos 2.5l/m nasally-24/7   Osteoporosis    Sacral fracture (HCC) 2014    Past Surgical History:  Procedure Laterality Date   ABDOMINAL HYSTERECTOMY     BIOPSY  05/24/2019   Procedure: BIOPSY;  Surgeon: Bernette Redbird, MD;  Location: [facility] ENDOSCOPY;  Service: Endoscopy;;   COLONOSCOPY WITH PROPOFOL Left 03/20/2020   Procedure: COLONOSCOPY WITH PROPOFOL;  Surgeon: Charlott Rakes, MD;  Location: [facility] ENDOSCOPY;  Service: Gastroenterology;  Laterality: Left;  needs ultraslim colonoscope   ESOPHAGOGASTRODUODENOSCOPY N/A 06/15/2013   Procedure: ESOPHAGOGASTRODUODENOSCOPY (EGD);  Surgeon:  Shirley Friar, MD;  Location: [facility] ENDOSCOPY;  Service: Endoscopy;  Laterality: N/A;   ESOPHAGOGASTRODUODENOSCOPY (EGD) WITH PROPOFOL N/A 05/24/2019   Procedure: ESOPHAGOGASTRODUODENOSCOPY (EGD) WITH PROPOFOL;  Surgeon: Bernette Redbird, MD;  Location: [facility] ENDOSCOPY;  Service: Endoscopy;  Laterality: N/A;   IR GENERIC HISTORICAL  10/15/2016   IR KYPHO LUMBAR INC FX REDUCE BONE BX UNI/BIL CANNULATION INC/IMAGING 10/15/2016 Julieanne Cotton, MD [facility]-INTERV RAD   POLYPECTOMY  03/20/2020   Procedure: POLYPECTOMY;  Surgeon: Charlott Rakes, MD;  Location: [facility] ENDOSCOPY;  Service: Gastroenterology;;   sacroplasty  06-14-13   05-10-13 IVR CONE for fracture stabilization     Inpatient Medications: Scheduled Meds:  acetaminophen  1,000 mg Oral Q8H   calcitonin (salmon)  1 spray Alternating Nares Daily   calcium carbonate  400 mg of elemental calcium Oral BID   cholecalciferol  1,000 Units Oral Daily   clotrimazole   Topical BID   DULoxetine  60 mg Oral Daily   ferrous gluconate  324 mg Oral QODAY   furosemide  40 mg Intravenous BID   ipratropium-albuterol  3 mL Nebulization BID   lidocaine  3 patch Transdermal Q24H   pantoprazole (PROTONIX) IV  40 mg Intravenous Q12H   polyethylene glycol  17 g Oral TID   senna-docusate  1 tablet Oral BID   thiamine  100 mg Oral Daily   Continuous Infusions:  cefTRIAXone (ROCEPHIN)  IV     PRN Meds: albuterol, HYDROmorphone (DILAUDID) injection, melatonin, ondansetron **OR** ondansetron (ZOFRAN) IV, mouth rinse, mouth rinse, oxyCODONE, sorbitol, milk of mag, mineral oil, glycerin (SMOG) enema  Allergies:    Allergies  Allergen Reactions   Ambien [Zolpidem] Other (See Comments)    Hallucinations and paranoid   Codeine Hives and Nausea And Vomiting    Social History:   Social History   Socioeconomic History   Marital status: Divorced    Spouse name: Not on file   Number of children: Not on file   Years of education: Not on file   Highest  education level: Not on file  Occupational History   Not on file  Tobacco Use   Smoking status: Former    Types: Cigarettes    Quit date: 06/15/2011    Years since quitting: 11.9   Smokeless tobacco: Never  Substance and Sexual Activity   Alcohol use: No   Drug use: No   Sexual activity: Never  Other Topics Concern   Not on file  Social History Narrative   Not on file   Social Determinants of Health   Financial Resource Strain: Not on file  Food Insecurity: No Food Insecurity (05/14/2023)   Hunger Vital Sign    Worried About Running Out of Food in the Last Year: Never true    Ran Out of Food in the Last Year: Never true  Transportation Needs: No Transportation Needs (05/14/2023)   PRAPARE - Administrator, Civil Service (Medical): No    Lack of Transportation (Non-Medical): No  Physical Activity: Not on file  Stress: Not on file  Social Connections: Not on file  Intimate Partner Violence: Not At Risk (05/14/2023)  Humiliation, Afraid, Rape, and Kick questionnaire    Fear of Current or Ex-Partner: No    Emotionally Abused: No    Physically Abused: No    Sexually Abused: No    Family History:   Family History  Problem Relation Age of Onset   Hypertension Sister    Diabetes Mellitus II Sister      ROS:  Please see the history of present illness.  All other ROS reviewed and negative.     Physical Exam/Data:   Vitals:   05/18/23 1318 05/18/23 2006 05/18/23 2039 05/19/23 0500  BP:  116/69  133/80  Pulse:  87  85  Resp:  16  16  Temp: (!) 97.4 F (36.3 C) (!) 97.4 F (36.3 C)  97.9 F (36.6 C)  TempSrc: Oral Oral  Oral  SpO2:  96% 94% 99%  Weight:      Height:        Intake/Output Summary (Last 24 hours) at 05/19/2023 0729 Last data filed at 05/19/2023 0400 Gross per 24 hour  Intake 250 ml  Output 1700 ml  Net -1450 ml      05/17/2023    5:44 AM 02/14/2022    4:54 PM 02/14/2022    4:58 AM  Last 3 Weights  Weight (lbs) 98 lb 83 lb 12.4 oz 91  lb  Weight (kg) 44.453 kg 38 kg 41.277 kg     Body mass index is 19.14 kg/m.  Patient did not consent to physical exam General: Cachectic, ill-appearing, malnourished HEENT:  Neck: unable to assess properly for JVD. Visually does not appear to have any.  Vascular:  Cardiac:   Lungs:   Abd:  Ext: no edema Musculoskeletal:   Skin:  Neuro:   Psych: Confused, sluggish, confused   EKG:  The EKG was personally reviewed and demonstrates: normal sinus rhythm, heart rate 81.  T wave inversions in septal leads and in inferior leads.  Telemetry:  Telemetry was personally reviewed and demonstrates: Normal sinus rhythm with heart rates 80s to 90s with frequent PVCs.  Relevant CV Studies: Echocardiogram 05/18/2023 1. Left ventricular ejection fraction, by estimation, is 65 to 70%. The  left ventricle has normal function. The left ventricle has no regional  wall motion abnormalities. Left ventricular diastolic parameters are  consistent with Grade I diastolic  dysfunction (impaired relaxation).   2. Right ventricular systolic function is moderately reduced. The right  ventricular size is severely enlarged. There is mildly elevated pulmonary  artery systolic pressure.   3. Left atrial size was moderately dilated.   4. Right atrial size was severely dilated.   5. The mitral valve is normal in structure. No evidence of mitral valve  regurgitation. No evidence of mitral stenosis.   6. The aortic valve is tricuspid. There is moderate calcification of the  aortic valve. There is moderate thickening of the aortic valve. Aortic  valve regurgitation is not visualized. Aortic valve  sclerosis/calcification is present, without any evidence  of aortic stenosis.   7. Midsystolic notching of the pulmonary outflow tract velocity profile  suggests severely elevated pulmonary vascular resistance.   8. The inferior vena cava is dilated in size with <50% respiratory  variability, suggesting right atrial  pressure of 15 mmHg.   Comparison(s): Prior images reviewed side by side. Changes from prior  study are noted. The right ventricular systolic function is significantly  worse. The right ventricle is now severely enlarged, as is the right  atrium.    Laboratory Data:  High Sensitivity Troponin:  No results for input(s): "TROPONINIHS" in the last 720 hours.   Chemistry Recent Labs  Lab 05/16/23 0401 05/17/23 0417 05/18/23 0435  NA 140 141 141  K 4.2 3.9 3.2*  CL 104 106 94*  CO2 28 29 36*  GLUCOSE 195* 154* 102*  BUN 18 15 10   CREATININE 0.70 0.80 0.66  CALCIUM 8.1* 8.0* 7.7*  MG 2.7* 2.4 1.8  GFRNONAA >60 >60 >60  ANIONGAP 8 6 11     Recent Labs  Lab 05/16/23 0401 05/17/23 0417 05/18/23 0435  PROT 6.2* 6.2* 6.3*  ALBUMIN 2.5* 2.5* 2.9*  AST 14* 49* 20  ALT 9 28 20   ALKPHOS 54 51 44  BILITOT 0.5 0.3 0.6   Lipids No results for input(s): "CHOL", "TRIG", "HDL", "LABVLDL", "LDLCALC", "CHOLHDL" in the last 168 hours.  Hematology Recent Labs  Lab 05/16/23 0401 05/17/23 0417 05/17/23 0902 05/17/23 1526 05/18/23 0435  WBC 9.0 7.9  --   --  7.1  RBC 3.33* 3.22*  --   --  3.25*  HGB 9.4* 9.3* 9.3* 9.2* 9.2*  HCT 32.1* 31.1* 32.1* 32.3* 31.2*  MCV 96.4 96.6  --   --  96.0  MCH 28.2 28.9  --   --  28.3  MCHC 29.3* 29.9*  --   --  29.5*  RDW 14.1 13.9  --   --  13.8  PLT 287 245  --   --  253   Thyroid  Recent Labs  Lab 05/16/23 0401  TSH 1.952    BNP Recent Labs  Lab 05/17/23 1040  BNP 1,307.6*    DDimer No results for input(s): "DDIMER" in the last 168 hours.   Radiology/Studies:  CT Angio Chest Pulmonary Embolism (PE) W or WO Contrast  Result Date: 05/18/2023 CLINICAL DATA:  Acute respiratory failure with hypoxia. Abnormal echocardiography (progressive right ventricular dilatation and dysfunction with biatrial enlargement). EXAM: CT ANGIOGRAPHY CHEST WITH CONTRAST TECHNIQUE: Multidetector CT imaging of the chest was performed using the standard  protocol during bolus administration of intravenous contrast. Multiplanar CT image reconstructions and MIPs were obtained to evaluate the vascular anatomy. RADIATION DOSE REDUCTION: This exam was performed according to the departmental dose-optimization program which includes automated exposure control, adjustment of the mA and/or kV according to patient size and/or use of iterative reconstruction technique. CONTRAST:  75mL OMNIPAQUE IOHEXOL 350 MG/ML SOLN COMPARISON:  Radiographs 05/17/2023 and 05/14/2023. Chest CTA 05/23/2007. Abdominopelvic CT 05/14/2023. FINDINGS: Cardiovascular: The pulmonary arteries are well opacified with contrast to the level of the segmental branches. There is no evidence of acute pulmonary embolism. Mild central enlargement of the pulmonary arteries. There is diffuse atherosclerosis of the aorta, great vessels and coronary arteries. The aorta is suboptimally opacified, but no acute systemic arterial abnormalities are identified. There are calcifications of the aortic valve. Mild cardiomegaly with predominately right-sided chamber enlargement. No significant pericardial effusion. Mediastinum/Nodes: There are no enlarged mediastinal, hilar or axillary lymph nodes. The thyroid gland, trachea and esophagus demonstrate no significant findings. Lungs/Pleura: Small right-greater-than-left pleural effusions with dependent consolidation in both lower lobes, likely due to atelectasis, although aspiration not excluded. These pulmonary opacities have increased compared with the recent abdominal CT. Underlying chronic biapical scarring, mild central airway thickening and scattered subpleural reticulation. No suspicious pulmonary nodules are identified. Upper abdomen: No definite acute findings are seen within the visualized upper abdomen. There is prominent reflux of contrast into IVC, hepatic veins and right renal vein. A large left-sided staghorn calculus is partially imaged,  grossly unchanged.  There is a smaller nonobstructing right renal calculus. Musculoskeletal/Chest wall: Interval development of a severe compression fracture at T7 which also appears new compared with chest radiographs 08/18/2018). There are chronic appearing compression deformities at T10, T11 and L1, status post spinal augmentation at the T11 and L1 levels. Review of the MIP images confirms the above findings. IMPRESSION: 1. No evidence of acute pulmonary embolism. Central enlargement of the pulmonary arteries consistent with pulmonary arterial hypertension. 2. Cardiomegaly with predominately right-sided chamber enlargement and prominent reflux of contrast into the IVC, hepatic veins and right renal vein consistent with elevated right heart pressures. 3. Small right-greater-than-left pleural effusions with increasing dependent consolidation in both lower lobes, likely due to atelectasis, although aspiration not excluded. 4. Interval development of a severe compression fracture at T7 which appears new compared with chest radiographs 08/18/2018. Chronic appearing compression deformities at T10, T11 and L1, status post spinal augmentation at T11 and L1. 5.  Aortic Atherosclerosis (ICD10-I70.0). Electronically Signed   By: Carey Bullocks M.D.   On: 05/18/2023 13:15   ECHOCARDIOGRAM COMPLETE  Result Date: 05/18/2023    ECHOCARDIOGRAM REPORT   Patient Name:   KAMREY STEIMLE Date of Exam: 05/18/2023 Medical Rec #:  [id number]     Height:       60.0 in Accession #:    [id number]    Weight:       98.0 lb Date of Birth:  [birth year]    BSA:          1.378 m Patient Age:    86 years      BP:           142/73 mmHg Patient Gender: F             HR:           78 bpm. Exam Location:  Inpatient Procedure: 2D Echo, Cardiac Doppler and Color Doppler Indications:    Other abnormalities of the heart R00.8  History:        Patient has prior history of Echocardiogram examinations, most                 recent 05/01/2019. COPD and Stroke,  Signs/Symptoms:Shortness of                 Breath; Risk Factors:Hypertension and Former Smoker.  Sonographer:    Darlys Gales Referring Phys: [phone] A CALDWELL POWELL JR IMPRESSIONS  1. Left ventricular ejection fraction, by estimation, is 65 to 70%. The left ventricle has normal function. The left ventricle has no regional wall motion abnormalities. Left ventricular diastolic parameters are consistent with Grade I diastolic dysfunction (impaired relaxation).  2. Right ventricular systolic function is moderately reduced. The right ventricular size is severely enlarged. There is mildly elevated pulmonary artery systolic pressure.  3. Left atrial size was moderately dilated.  4. Right atrial size was severely dilated.  5. The mitral valve is normal in structure. No evidence of mitral valve regurgitation. No evidence of mitral stenosis.  6. The aortic valve is tricuspid. There is moderate calcification of the aortic valve. There is moderate thickening of the aortic valve. Aortic valve regurgitation is not visualized. Aortic valve sclerosis/calcification is present, without any evidence of aortic stenosis.  7. Midsystolic notching of the pulmonary outflow tract velocity profile suggests severely elevated pulmonary vascular resistance.  8. The inferior vena cava is dilated in size with <50% respiratory variability, suggesting right atrial pressure of 15 mmHg. Comparison(s): Prior images reviewed side  by side. Changes from prior study are noted. The right ventricular systolic function is significantly worse. The right ventricle is now severely enlarged, as is the right atrium. FINDINGS  Left Ventricle: Left ventricular ejection fraction, by estimation, is 65 to 70%. The left ventricle has normal function. The left ventricle has no regional wall motion abnormalities. The left ventricular internal cavity size was normal in size. There is  no left ventricular hypertrophy. Left ventricular diastolic parameters are consistent  with Grade I diastolic dysfunction (impaired relaxation). Normal left ventricular filling pressure. Right Ventricle: The right ventricular size is severely enlarged. Right vetricular wall thickness was not well visualized. Right ventricular systolic function is moderately reduced. There is mildly elevated pulmonary artery systolic pressure. The tricuspid regurgitant velocity is 2.57 m/s, and with an assumed right atrial pressure of 15 mmHg, the estimated right ventricular systolic pressure is 41.4 mmHg. Left Atrium: Left atrial size was moderately dilated. Right Atrium: Right atrial size was severely dilated. Pericardium: There is no evidence of pericardial effusion. Mitral Valve: The mitral valve is normal in structure. Mild to moderate mitral annular calcification. No evidence of mitral valve regurgitation. No evidence of mitral valve stenosis. Tricuspid Valve: The tricuspid valve is normal in structure. Tricuspid valve regurgitation is trivial. Aortic Valve: The aortic valve is tricuspid. There is moderate calcification of the aortic valve. There is moderate thickening of the aortic valve. Aortic valve regurgitation is not visualized. Aortic valve sclerosis/calcification is present, without any  evidence of aortic stenosis. Aortic valve mean gradient measures 4.0 mmHg. Aortic valve peak gradient measures 7.4 mmHg. Aortic valve area, by VTI measures 1.57 cm. Pulmonic Valve: Midsystolic notching of the pulmonary outflow tract velocity profile suggests severely elevated pulmonary vascular resistance. The pulmonic valve was grossly normal. Pulmonic valve regurgitation is trivial. Aorta: The aortic root is normal in size and structure. Venous: The inferior vena cava is dilated in size with less than 50% respiratory variability, suggesting right atrial pressure of 15 mmHg. IAS/Shunts: No atrial level shunt detected by color flow Doppler.  LEFT VENTRICLE PLAX 2D LVIDd:         3.40 cm   Diastology LVIDs:         1.70  cm   LV e' medial:    5.33 cm/s LV PW:         1.10 cm   LV E/e' medial:  7.5 LV IVS:        0.80 cm   LV e' lateral:   7.51 cm/s LVOT diam:     1.94 cm   LV E/e' lateral: 5.3 LV SV:         47 LV SV Index:   34 LVOT Area:     2.96 cm  RIGHT VENTRICLE RV S prime:     10.00 cm/s TAPSE (M-mode): 1.2 cm LEFT ATRIUM             Index        RIGHT ATRIUM           Index LA Vol (A2C):   50.8 ml 36.85 ml/m  RA Area:     24.60 cm LA Vol (A4C):   58.6 ml 42.51 ml/m  RA Volume:   83.00 ml  60.21 ml/m LA Biplane Vol: 54.8 ml 39.76 ml/m  AORTIC VALVE AV Area (Vmax):    1.87 cm AV Area (Vmean):   1.59 cm AV Area (VTI):     1.57 cm AV Vmax:  136.00 cm/s AV Vmean:          98.300 cm/s AV VTI:            0.300 m AV Peak Grad:      7.4 mmHg AV Mean Grad:      4.0 mmHg LVOT Vmax:         86.20 cm/s LVOT Vmean:        52.900 cm/s LVOT VTI:          0.159 m LVOT/AV VTI ratio: 0.53 MITRAL VALVE               TRICUSPID VALVE MV Area (PHT): 3.17 cm    TR Peak grad:   26.4 mmHg MV Decel Time: 239 msec    TR Vmax:        257.00 cm/s MV E velocity: 39.80 cm/s MV A velocity: 90.00 cm/s  SHUNTS MV E/A ratio:  0.44        Systemic VTI:  0.16 m                            Systemic Diam: 1.94 cm Rachelle Hora Croitoru MD Electronically signed by Thurmon Fair MD Signature Date/Time: 05/18/2023/9:59:53 AM    Final    DG CHEST PORT 1 VIEW  Result Date: 05/17/2023 CLINICAL DATA:  Shortness of breath EXAM: PORTABLE CHEST 1 VIEW COMPARISON:  CXR 05/14/23 FINDINGS: Small bilateral pleural effusions, right-greater-than-left, new from prior exam. New bibasilar airspace opacities could represent atelectasis or infection. Unchanged cardiac and mediastinal contours. No radiographically apparent displaced rib fractures. Visualized upper abdomen is unremarkable. Kyphoplasty changes in the lower thoracic and upper lumbar spine. IMPRESSION: 1. Small bilateral pleural effusions, right-greater-than-left, new from prior exam. 2. New bibasilar airspace  opacities could represent atelectasis or infection. Electronically Signed   By: Lorenza Cambridge M.D.   On: 05/17/2023 10:27     Assessment and Plan:   New onset right-sided heart failure with small bilateral pleural effusions Patient has LVEF 65 to 70% with moderately reduced RV systolic function with severely enlarged RV.  Severely dilated RA.  RVSP 41.  She had midsystolic notching in the pulmonary outflow tract suggesting severely elevated pulmonary vascular resistance.  BNP over 1300+.  Her IVC was dilated with less than 50% variability.  Currently is being diuresed on 40 mg of IV Lasix twice daily.  Previous echocardiogram in May 2020 was normal. Difficult to attribute her RV failure to L sided heart failure given no evidence LVH and normal LVEF. BP appears well controlled. Underlying cause of her RV failure is unknown at this time, could be attributed to chronic COPD, but question other causes. Would need right heart cath to better assess for pulmonary hypertension however given her current condition would not be a candidate (AMS, frailty, CAP, anemia, possible melena). Will consult with MD about further evaluation and plans.  Could consider VQ scan for chronic PE Agree with continued diuresis plan.  CTA neg for PE  New anterior/inferior T wave inversions Given mentation difficult to properly assess for any underlying ischemic causes and given above still would not be a good candidate for invasive procedures. If mentation and current condition improved can reevaluate.   HTN  Well controlled and not on any meds currently.   Hypokalemia  Likely due to poor oral intake. Will supplement.   Altered mental status, combative Patient appears to be severely confused antibiotics throughout entire admission.  She has declined multiple  medications and has had poor oral intake. Declined PE and interview with me.   Goals of care, poor prognosis, malnourishment, failure to thrive Talked with patient's  son and he is agreeable to discussion for palliative care.  Prior notes state that she is still full code.  Will help to arrange this.  Community-acquired pneumonia on 3 L  COPD on 2-3 chronically  Ureteritis, staghorn calculus Constipation Possible melena  Anemia  Mild ileus   Risk Assessment/Risk Scores:  New York Heart Association (NYHA) Functional Class Unable to assess.   For questions or updates, please contact [facility] Please consult [email or web address] for contact info under  Signed, Abagail Kitchens, PA-C  05/19/2023 7:29 AM    Patient seen and examined, note reviewed with the signed Advanced Practice Provider. I personally reviewed laboratory data, imaging studies and relevant notes. I independently examined the patient and formulated the important aspects of the plan. I have personally discussed the plan with the patient and/or family. Comments or changes to the note/plan are indicated below.  Seen examined her bedside.  Very combative during our visit.    New onset of right-sided heart failure with bilateral pleural effusion Abnormal EKG Hypertension Electrolyte abnormalities Altered mental status Failure to thrive  Her echocardiogram showed normal left atrial systolic function with moderately reduced RV systolic function which she also shows severely enlarged RV noted pulmonary hypertension.  Unclear etiology of her pulmonary heart hypertension suspect likely group 2 due to chronic COPD.  A not quite sure how much a comprehensive evaluation for pulmonary hypertension will serve at this time especially given this should be an outpatient workup. Unable to solicit if there is any anginal symptoms with the patient given her mentation.  For now we will continue with the medical management.  No patient for any hemic evaluation also.  Will continue the Lasix, please continue to replete electrolyte keep K above 4 mag above 2.  Ideally with multiple comorbidities including  significant failure to thrive suspected closer to end-stage palliative care consultation is very appropriate at this time.   Thomasene Ripple DO, MS [facility] Attending Cardiologist [facility]  [address] [place], [place] [address] [phone] Website: [email or web address]

## 2023-05-20 DIAGNOSIS — J9621 Acute and chronic respiratory failure with hypoxia: Secondary | ICD-10-CM | POA: Diagnosis not present

## 2023-05-20 DIAGNOSIS — Z515 Encounter for palliative care: Secondary | ICD-10-CM

## 2023-05-20 DIAGNOSIS — Z7189 Other specified counseling: Secondary | ICD-10-CM

## 2023-05-20 DIAGNOSIS — J189 Pneumonia, unspecified organism: Secondary | ICD-10-CM | POA: Diagnosis not present

## 2023-05-20 LAB — BASIC METABOLIC PANEL
Anion gap: 13 (ref 5–15)
BUN: 21 mg/dL (ref 8–23)
CO2: 42 mmol/L — ABNORMAL HIGH (ref 22–32)
Calcium: 8.1 mg/dL — ABNORMAL LOW (ref 8.9–10.3)
Chloride: 82 mmol/L — ABNORMAL LOW (ref 98–111)
Creatinine, Ser: 0.86 mg/dL (ref 0.44–1.00)
GFR, Estimated: >60 mL/min (ref >60)
Glucose, Bld: 118 mg/dL — ABNORMAL HIGH (ref 70–99)
Potassium: 3.4 mmol/L — ABNORMAL LOW (ref 3.5–5.1)
Sodium: 137 mmol/L (ref 135–145)

## 2023-05-20 MED ORDER — POTASSIUM CHLORIDE 10 MEQ/100ML IV SOLN
10.0000 meq | INTRAVENOUS | Status: AC
  Administered 2023-05-20: 10 meq via INTRAVENOUS
  Filled 2023-05-20: qty 100

## 2023-05-20 MED ORDER — POTASSIUM CHLORIDE 10 MEQ/100ML IV SOLN
10.0000 meq | INTRAVENOUS | Status: AC
  Administered 2023-05-20 (×2): 10 meq via INTRAVENOUS
  Filled 2023-05-20 (×2): qty 100

## 2023-05-20 MED ORDER — POTASSIUM CHLORIDE 10 MEQ/100ML IV SOLN
INTRAVENOUS | Status: AC
  Filled 2023-05-20: qty 100

## 2023-05-20 NOTE — TOC Progression Note (Signed)
Transition of Care [facility]) - Progression Note    Patient Details  Name: [name] MRN: [id number] Date of Birth: 1936/08/09  [facility] CM/SW Contact  Larrie Kass, LCSW Phone Number: 05/20/2023, 3:45 PM  Clinical Narrative:    CSW received a message from MD that pt and son changed their mind about SNF placement. CSW spoke with pt's son, he reports he will speak with his mother tomorrow. CSW inquired if it is ok for CSW to fax pt's information and further discuss placement tomorrow, pt's son has agreed to this. TOC to follow.     Expected Discharge Plan:  (TBD) Barriers to Discharge: Continued Medical Work up  Expected Discharge Plan and Services                                               Social Determinants of Health (SDOH) Interventions SDOH Screenings   Food Insecurity: No Food Insecurity (05/14/2023)  Housing: Low Risk  (05/14/2023)  Transportation Needs: No Transportation Needs (05/14/2023)  Utilities: Not At Risk (05/14/2023)  Depression (PHQ2-9): Low Risk  (06/08/2019)  Tobacco Use: Medium Risk (05/14/2023)    Readmission Risk Interventions    02/16/2022   11:24 AM  Readmission Risk Prevention Plan  Post Dischage Appt Complete  Medication Screening Complete  Transportation Screening Complete

## 2023-05-20 NOTE — Progress Notes (Signed)
Speech Language Pathology Treatment: Dysphagia  Patient Details Name: [name] MRN: [id number] DOB: [birth year] Today's Date: 05/20/2023 Time: 1831-1900 SLP Time Calculation (min) (ACUTE ONLY): 29 min  Assessment / Plan / Recommendation Clinical Impression  Session focused on addressing patient's dysphagia goals. Initially SLP offered to purchas snack to assess her ability to consume solids. She advised she enjoys pastries but declined to consume anything including liquids. After SLP communicated with physician and created swallow precautions sign patient willing to consume Sprite. Obtained during before patient which she consumed via straw and cup but her arthritis impairs her ability to hold started on cup. SLP obtained two-handled cup with a lid for patient and she demonstrated much easier ability to control cup. Patient then readily consumed graham crackers and Sprite with no indication of difficulty. She will benefit from set up assistance due to her physical limitations. Ms. Lions admits that her intake is poor because she does not like the food and given significantly improved mentation, articulate and swallow advancement and diet is advised. In discussion with patient she prefers a soft diet as she reports difficulties swallowing hard foods. Educated patient to General precautions and encouraged her to continue p.o. intake using advised precautions given her history of esophageal issues, She adamanatly does not like Ensure. At this time SLP will sign off as patient is satisfied with her diet advancement and tools to help with eating/swallowing. Thank you for allowing this SLP to help with patient's care plan     HPI HPI: [name] is a 87 y.o. female who presented to the emergency department complaints of constipation associated with LLQ pain.  CXR 5/17: "Hazy bibasilar airspace opacities could represent atelectasis or infection." Pt with medical history significant of osteoarthritis,  chronic back pain, history of colitis, lumbar vertebrae compression fracture, sacral fracture, COPD, delirium, hypertension, osteoporosis, history of multiple episodes of pneumonia.  She has been placed on a pured and thin liquid diet based on clinical swallow evaluation.  SLP follow-up to determine indication for instrumental evaluation versus advancing diet chronically.      SLP Plan  All goals met      Recommendations for follow up therapy are one component of a multi-disciplinary discharge planning process, led by the attending physician.  Recommendations may be updated based on patient status, additional functional criteria and insurance authorization.    Recommendations  Diet recommendations: Dysphagia 3 (mechanical soft);Thin liquid (per pt wishes) Liquids provided via: Straw;Cup Medication Administration: Other (Comment) (as tolerated) Supervision: Intermittent supervision to cue for compensatory strategies Compensations: Slow rate;Small sips/bites Postural Changes and/or Swallow Maneuvers: Upright 30-60 min after meal (upright as able)                  Oral care BID     Dysphagia, unspecified (R13.10)     All goals met    Rolena Infante, MS [facility] SLP Acute Rehab Services Office [phone]  Chales Abrahams  05/20/2023, 7:14 PM

## 2023-05-20 NOTE — Progress Notes (Signed)
Progress Note   Patient: [name] LOV:[id number] DOB: 1936-04-01 DOA: 05/14/2023     6 DOS: the patient was seen and examined on 05/20/2023   Brief hospital course: 86yow presented with constipation and left lower quadrant pain.  Admitted for acute on chronic respiratory failure with hypoxia, pneumonia.  Assessment and Plan: Acute on chronic respiratory failure with hypoxia (HCC) on 2 to 3 L at home Streptococcus pneumoniae pneumonia COPD (chronic obstructive pulmonary disease) (HCC) At baseline oxygen requirement.  COPD appears to be stable.  Continue antibiotics   New diagnosis right-sided systolic heart failure, chronic Pulmonary hypertension etiology unknown Based on 2D echocardiogram.  CT negative for PE. Seen by cardiology.  Medical management recommended.  Stop Lasix.  Follow BMP. DC on PO lasix 40mg  PRN.    Acute metabolic encephalopathy Much improved today, will monitor Workup unrevealing.   Ureteritis Staghorn calculus No urine culture was sent Follow-up with urology as an outpatient.  Renal function preserved.  Dysphagia  Dysphagia 1 diet, thin liquid per ST eval 5/18   Constipation Last bowel movement 5/22.  Continue bowel regimen.   Normocytic anemia Monitor hematocrit and hemoglobin.  Hemoglobin stable.   Abdominal wall hernia Follow-up with general surgery as an outpatient   Severe compression fracture at T7 Chronicity unclear.  Supportive care.   Chart records concern for melena.  The genesis for this concern appears to be an RN note from 5/20, which noted a dark stool but no visible blood.  Hemoglobin is stable. No fecal occult blood was ordered.  No further evaluation suggested.   Scapholunate Ligament Tear, Hyperextension of MCP Joint of thumb and generative arthropathy and possible subluxation of First carpometacarpal articulation Follow outpatient    Goals of care.  DNR is on file nevertheless apparently the son stated full CODE STATUS 5/20.   Palliative care was declined initially, but now patient and son are open to it -- will reconsult   Pressure injury stage 1 sacrum present on admission (at the time of the admission order)      Subjective:  Feels better today But SOB, feels weak  Physical Exam: Vitals:   05/19/23 2036 05/20/23 0457 05/20/23 0858 05/20/23 1256  BP: (!) 117/55 112/65  (!) 114/59  Pulse:      Resp: 18     Temp: 98.7 F (37.1 C) 98.3 F (36.8 C)  (!) 97.5 F (36.4 C)  TempSrc: Oral Oral  Oral  SpO2:   94%   Weight:      Height:       Physical Exam Vitals reviewed.  Constitutional:      General: She is not in acute distress.    Appearance: She is not ill-appearing or toxic-appearing.  Cardiovascular:     Rate and Rhythm: Normal rate and regular rhythm.     Heart sounds: No murmur heard. Pulmonary:     Effort: No respiratory distress.     Breath sounds: No wheezing, rhonchi or rales.  Neurological:     Mental Status: She is alert.     Comments: Oriented self, location, month, not year  Psychiatric:        Mood and Affect: Mood normal.        Behavior: Behavior normal.     Data Reviewed: K+ 3.4  Family Communication: son by telephone  Disposition: Status is: Inpatient Remains inpatient appropriate because: pneumonia  Planned Discharge Destination: Skilled nursing facility    Time spent: 35 minutes  Author: Brendia Sacks, MD 05/20/2023  6:22 PM  For on call review [email or web address].

## 2023-05-20 NOTE — [place] FL2 (Signed)
[place] MEDICAID FL2 LEVEL OF CARE FORM     IDENTIFICATION  Patient Name: [name] Birthdate: 26-Apr-1936 Sex: female Admission Date (Current Location): 05/14/2023  [facility] and [place] Number:  Producer, television/film/video and Address:  [facility],  [address] [place], [place] [address]      Provider Number: [id number]  Attending Physician Name and Address:  Standley Brooking, MD  Relative Name and Phone Number:  Blanca Friend [phone] [facility] Phone)    Current Level of Care: Hospital Recommended Level of Care: Skilled Nursing Facility Prior Approval Number:    Date Approved/Denied:   PASRR Number: pending  Discharge Plan: SNF    Current Diagnoses: Patient Active Problem List   Diagnosis Date Noted   Acute on chronic respiratory failure with hypoxia (HCC) 05/14/2023   Constipation 05/14/2023   Ureteritis 05/14/2023   Staghorn calculus 05/14/2023   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   Malnutrition of moderate degree 09/10/2021   Microcytic anemia 09/09/2021   GIB (gastrointestinal bleeding) 04/15/2020   Fecal impaction in rectum (HCC)    Thrombocytosis 03/16/2020   Pressure injury of skin 03/16/2020   Left knee pain 06/18/2019   Upper GI bleed 06/17/2019   Wrist arthritis 05/28/2019   Acute blood loss anemia    Symptomatic anemia    Supplemental oxygen dependent    Right pontine stroke (HCC) 05/02/2019   Stroke (HCC) 05/01/2019   Acute renal failure superimposed on stage 3 chronic kidney disease (HCC) 05/01/2019   Acute ischemic stroke (HCC) 05/01/2019   AKI (acute kidney injury) (HCC) 08/29/2018   Encephalopathy acute 08/22/2018   Chronic back pain 08/22/2018   Vitamin B12 deficiency 08/19/2018   Dehydration    Tachycardia    Altered mental state 08/18/2018   Closed compression fracture of L1 vertebra (HCC)    Acute cystitis without hematuria    Pain management    Intractable pain    Back pain 10/10/2016   L2 vertebral fracture (HCC)  10/10/2016   Acute encephalopathy 12/25/2015   Drug-induced delirium 12/25/2015   Acute on chronic respiratory failure (HCC) 12/25/2015   CAP (community acquired pneumonia) 12/19/2015   Chronic respiratory failure (HCC) 03/19/2014   Depression 03/19/2014   Aspiration pneumonia (HCC) 03/10/2014   Acute respiratory failure with hypoxia (HCC) 03/10/2014   Sepsis (HCC) 03/10/2014   Hypokalemia 03/10/2014   Hypertension 03/10/2014   COPD (chronic obstructive pulmonary disease) (HCC) 03/10/2014   Tobacco abuse 03/10/2014   Normocytic anemia 06/15/2013   Generalized abdominal pain 06/15/2013   Hematochezia 06/15/2013    Orientation RESPIRATION BLADDER Height & Weight     Time, Situation, Place, Self  O2 (4L) Incontinent Weight: 98 lb (44.5 kg) Height:  5' (152.4 cm)  BEHAVIORAL SYMPTOMS/MOOD NEUROLOGICAL BOWEL NUTRITION STATUS      Incontinent Diet (DYS 1)  AMBULATORY STATUS COMMUNICATION OF NEEDS Skin   Extensive Assist Verbally PU Stage and Appropriate Care PU Stage 1 Dressing:  (PRN)                     Personal Care Assistance Level of Assistance  Bathing, Feeding, Dressing Bathing Assistance: Limited assistance Feeding assistance: Independent Dressing Assistance: Limited assistance     Functional Limitations Info  Sight, Hearing, Speech Sight Info: Adequate Hearing Info: Adequate Speech Info: Adequate    SPECIAL CARE FACTORS FREQUENCY  PT (By licensed PT), OT (By licensed OT)     PT Frequency: 5 x a week OT Frequency: 5 x a week  Contractures Contractures Info: Not present    Additional Factors Info  Code Status, Allergies Code Status Info: full Allergies Info: Ambien (Zolpidem)  Codeine           Current Medications (05/20/2023):  This is the current hospital active medication list Current Facility-Administered Medications  Medication Dose Route Frequency Provider Last Rate Last Admin   albuterol (PROVENTIL) (2.5 MG/3ML) 0.083%  nebulizer solution 2.5 mg  2.5 mg Nebulization QID PRN Bobette Mo, MD   2.5 mg at 05/17/23 0150   calcitonin (salmon) (MIACALCIN/FORTICAL) nasal spray 1 spray  1 spray Alternating Nares Daily Zigmund Daniel., MD   1 spray at 05/20/23 1610   calcium carbonate (TUMS - dosed in mg elemental calcium) chewable tablet 400 mg of elemental calcium  400 mg of elemental calcium Oral BID Zigmund Daniel., MD   400 mg of elemental calcium at 05/20/23 0901   cholecalciferol (VITAMIN D3) 25 MCG (1000 UNIT) tablet 1,000 Units  1,000 Units Oral Daily Zigmund Daniel., MD   1,000 Units at 05/20/23 9604   clotrimazole (LOTRIMIN) 1 % cream   Topical BID Bobette Mo, MD   1 Application at 05/20/23 0905   DULoxetine (CYMBALTA) DR capsule 60 mg  60 mg Oral Daily Bobette Mo, MD   60 mg at 05/20/23 0902   ferrous gluconate (FERGON) tablet 324 mg  324 mg Oral Katherine Roan., MD   324 mg at 05/20/23 5409   HYDROmorphone (DILAUDID) injection 0.25 mg  0.25 mg Intravenous Q4H PRN Zigmund Daniel., MD   0.25 mg at 05/18/23 1038   ipratropium-albuterol (DUONEB) 0.5-2.5 (3) MG/3ML nebulizer solution 3 mL  3 mL Nebulization BID Bobette Mo, MD   3 mL at 05/20/23 0857   lidocaine (LIDODERM) 5 % 3 patch  3 patch Transdermal Q24H Zigmund Daniel., MD   3 patch at 05/20/23 8119   melatonin tablet 6 mg  6 mg Oral QHS PRN Anthoney Harada, NP   6 mg at 05/14/23 2112   ondansetron (ZOFRAN) tablet 4 mg  4 mg Oral Q6H PRN Bobette Mo, MD       Or   ondansetron [facility]) injection 4 mg  4 mg Intravenous Q6H PRN Bobette Mo, MD   4 mg at 05/20/23 1101   Oral care mouth rinse  15 mL Mouth Rinse PRN Bobette Mo, MD       Oral care mouth rinse  15 mL Mouth Rinse PRN Zigmund Daniel., MD       oxyCODONE (Oxy IR/ROXICODONE) immediate release tablet 2.5 mg  2.5 mg Oral Q4H PRN Zigmund Daniel., MD   2.5 mg at 05/20/23 0855   pantoprazole  (PROTONIX) injection 40 mg  40 mg Intravenous Q12H Zigmund Daniel., MD   40 mg at 05/20/23 1478   polyethylene glycol (MIRALAX / GLYCOLAX) packet 17 g  17 g Oral TID Zigmund Daniel., MD   17 g at 05/20/23 2956   senna-docusate (Senokot-S) tablet 1 tablet  1 tablet Oral BID Bobette Mo, MD   1 tablet at 05/20/23 0902   sorbitol, milk of mag, mineral oil, glycerin (SMOG) enema  960 mL Rectal PRN Bobette Mo, MD   960 mL at 05/15/23 1933   thiamine (VITAMIN B1) tablet 100 mg  100 mg Oral Daily Hessie Knows, RPH   100 mg at 05/20/23 2130  Discharge Medications: Please see discharge summary for a list of discharge medications.  Relevant Imaging Results:  Relevant Lab Results:   Additional Information SSN:[id number]  Valentina Shaggy Kasim Mccorkle, LCSW

## 2023-05-20 NOTE — Progress Notes (Addendum)
Rounding Note    Patient Name: [name] Date of Encounter: 05/20/2023  [facility] Cardiologist: None   Subjective   Patient's mentation significantly improved from yesterday.  Today we are actually able to have a conversation.  Does not have any significant complaints right now but appears very weak and has not been eating or drinking.  Inpatient Medications    Scheduled Meds:  acetaminophen  1,000 mg Oral Q8H   calcitonin (salmon)  1 spray Alternating Nares Daily   calcium carbonate  400 mg of elemental calcium Oral BID   cholecalciferol  1,000 Units Oral Daily   clotrimazole   Topical BID   DULoxetine  60 mg Oral Daily   ferrous gluconate  324 mg Oral QODAY   furosemide  40 mg Intravenous BID   ipratropium-albuterol  3 mL Nebulization BID   lidocaine  3 patch Transdermal Q24H   pantoprazole (PROTONIX) IV  40 mg Intravenous Q12H   polyethylene glycol  17 g Oral TID   senna-docusate  1 tablet Oral BID   thiamine  100 mg Oral Daily   Continuous Infusions:  cefTRIAXone (ROCEPHIN)  IV Stopped (05/19/23 1139)   PRN Meds: albuterol, HYDROmorphone (DILAUDID) injection, melatonin, ondansetron **OR** ondansetron (ZOFRAN) IV, mouth rinse, mouth rinse, oxyCODONE, sorbitol, milk of mag, mineral oil, glycerin (SMOG) enema   Vital Signs    Vitals:   05/19/23 1600 05/19/23 2014 05/19/23 2036 05/20/23 0457  BP: (!) 96/52  (!) 117/55 112/65  Pulse:      Resp:   18   Temp:   98.7 F (37.1 C) 98.3 F (36.8 C)  TempSrc:   Oral Oral  SpO2:  97%    Weight:      Height:        Intake/Output Summary (Last 24 hours) at 05/20/2023 0813 Last data filed at 05/20/2023 0457 Gross per 24 hour  Intake 567.8 ml  Output 1250 ml  Net -682.2 ml      05/17/2023    5:44 AM 02/14/2022    4:54 PM 02/14/2022    4:58 AM  Last 3 Weights  Weight (lbs) 98 lb 83 lb 12.4 oz 91 lb  Weight (kg) 44.453 kg 38 kg 41.277 kg      Telemetry    Normal sinus rhythm heart rates in the  80s- Personally Reviewed  ECG    No new tracings- Personally Reviewed  Physical Exam   GEN: Cachectic, ill-appearing, malnourished Neck: No JVD Cardiac: RRR, no murmurs, rubs, or gallops.  Respiratory: Slight crackles left lower base GI: Soft, nontender, non-distended  MS: No edema; No deformity. Neuro:  Nonfocal  Psych: Sluggish, slightly confused  Labs    High Sensitivity Troponin:  No results for input(s): "TROPONINIHS" in the last 720 hours.   Chemistry Recent Labs  Lab 05/16/23 0401 05/17/23 0417 05/18/23 0435 05/19/23 0835 05/20/23 0427  NA 140 141 141 139 137  K 4.2 3.9 3.2* 3.2* 3.4*  CL 104 106 94* 82* 82*  CO2 28 29 36* 44* 42*  GLUCOSE 195* 154* 102* 104* 118*  BUN 18 15 10 11 21   CREATININE 0.70 0.80 0.66 0.68 0.86  CALCIUM 8.1* 8.0* 7.7* 8.0* 8.1*  MG 2.7* 2.4 1.8  --   --   PROT 6.2* 6.2* 6.3*  --   --   ALBUMIN 2.5* 2.5* 2.9*  --   --   AST 14* 49* 20  --   --   ALT 9 28 20   --   --  ALKPHOS 54 51 44  --   --   BILITOT 0.5 0.3 0.6  --   --   GFRNONAA >60 >60 >60 >60 >60  ANIONGAP 8 6 11 13 13     Lipids No results for input(s): "CHOL", "TRIG", "HDL", "LABVLDL", "LDLCALC", "CHOLHDL" in the last 168 hours.  Hematology Recent Labs  Lab 05/17/23 0417 05/17/23 0902 05/17/23 1526 05/18/23 0435 05/19/23 0835  WBC 7.9  --   --  7.1 10.7*  RBC 3.22*  --   --  3.25* 4.03  HGB 9.3*   < > 9.2* 9.2* 11.2*  HCT 31.1*   < > 32.3* 31.2* 37.5  MCV 96.6  --   --  96.0 93.1  MCH 28.9  --   --  28.3 27.8  MCHC 29.9*  --   --  29.5* 29.9*  RDW 13.9  --   --  13.8 13.6  PLT 245  --   --  253 378   < > = values in this interval not displayed.   Thyroid  Recent Labs  Lab 05/16/23 0401  TSH 1.952    BNP Recent Labs  Lab 05/17/23 1040  BNP 1,307.6*    DDimer No results for input(s): "DDIMER" in the last 168 hours.   Radiology    CT Angio Chest Pulmonary Embolism (PE) W or WO Contrast  Result Date: 05/18/2023 CLINICAL DATA:  Acute respiratory  failure with hypoxia. Abnormal echocardiography (progressive right ventricular dilatation and dysfunction with biatrial enlargement). EXAM: CT ANGIOGRAPHY CHEST WITH CONTRAST TECHNIQUE: Multidetector CT imaging of the chest was performed using the standard protocol during bolus administration of intravenous contrast. Multiplanar CT image reconstructions and MIPs were obtained to evaluate the vascular anatomy. RADIATION DOSE REDUCTION: This exam was performed according to the departmental dose-optimization program which includes automated exposure control, adjustment of the mA and/or kV according to patient size and/or use of iterative reconstruction technique. CONTRAST:  75mL OMNIPAQUE IOHEXOL 350 MG/ML SOLN COMPARISON:  Radiographs 05/17/2023 and 05/14/2023. Chest CTA 05/23/2007. Abdominopelvic CT 05/14/2023. FINDINGS: Cardiovascular: The pulmonary arteries are well opacified with contrast to the level of the segmental branches. There is no evidence of acute pulmonary embolism. Mild central enlargement of the pulmonary arteries. There is diffuse atherosclerosis of the aorta, great vessels and coronary arteries. The aorta is suboptimally opacified, but no acute systemic arterial abnormalities are identified. There are calcifications of the aortic valve. Mild cardiomegaly with predominately right-sided chamber enlargement. No significant pericardial effusion. Mediastinum/Nodes: There are no enlarged mediastinal, hilar or axillary lymph nodes. The thyroid gland, trachea and esophagus demonstrate no significant findings. Lungs/Pleura: Small right-greater-than-left pleural effusions with dependent consolidation in both lower lobes, likely due to atelectasis, although aspiration not excluded. These pulmonary opacities have increased compared with the recent abdominal CT. Underlying chronic biapical scarring, mild central airway thickening and scattered subpleural reticulation. No suspicious pulmonary nodules are  identified. Upper abdomen: No definite acute findings are seen within the visualized upper abdomen. There is prominent reflux of contrast into IVC, hepatic veins and right renal vein. A large left-sided staghorn calculus is partially imaged, grossly unchanged. There is a smaller nonobstructing right renal calculus. Musculoskeletal/Chest wall: Interval development of a severe compression fracture at T7 which also appears new compared with chest radiographs 08/18/2018). There are chronic appearing compression deformities at T10, T11 and L1, status post spinal augmentation at the T11 and L1 levels. Review of the MIP images confirms the above findings. IMPRESSION: 1. No evidence of acute pulmonary embolism. Central  enlargement of the pulmonary arteries consistent with pulmonary arterial hypertension. 2. Cardiomegaly with predominately right-sided chamber enlargement and prominent reflux of contrast into the IVC, hepatic veins and right renal vein consistent with elevated right heart pressures. 3. Small right-greater-than-left pleural effusions with increasing dependent consolidation in both lower lobes, likely due to atelectasis, although aspiration not excluded. 4. Interval development of a severe compression fracture at T7 which appears new compared with chest radiographs 08/18/2018. Chronic appearing compression deformities at T10, T11 and L1, status post spinal augmentation at T11 and L1. 5.  Aortic Atherosclerosis (ICD10-I70.0). Electronically Signed   By: Carey Bullocks M.D.   On: 05/18/2023 13:15   ECHOCARDIOGRAM COMPLETE  Result Date: 05/18/2023    ECHOCARDIOGRAM REPORT   Patient Name:   EMALIA HERTEL Date of Exam: 05/18/2023 Medical Rec #:  [id number]     Height:       60.0 in Accession #:    [id number]    Weight:       98.0 lb Date of Birth:  08-11-1936    BSA:          1.378 m Patient Age:    86 years      BP:           142/73 mmHg Patient Gender: F             HR:           78 bpm. Exam Location:   Inpatient Procedure: 2D Echo, Cardiac Doppler and Color Doppler Indications:    Other abnormalities of the heart R00.8  History:        Patient has prior history of Echocardiogram examinations, most                 recent 05/01/2019. COPD and Stroke, Signs/Symptoms:Shortness of                 Breath; Risk Factors:Hypertension and Former Smoker.  Sonographer:    Darlys Gales Referring Phys: [phone] A CALDWELL POWELL JR IMPRESSIONS  1. Left ventricular ejection fraction, by estimation, is 65 to 70%. The left ventricle has normal function. The left ventricle has no regional wall motion abnormalities. Left ventricular diastolic parameters are consistent with Grade I diastolic dysfunction (impaired relaxation).  2. Right ventricular systolic function is moderately reduced. The right ventricular size is severely enlarged. There is mildly elevated pulmonary artery systolic pressure.  3. Left atrial size was moderately dilated.  4. Right atrial size was severely dilated.  5. The mitral valve is normal in structure. No evidence of mitral valve regurgitation. No evidence of mitral stenosis.  6. The aortic valve is tricuspid. There is moderate calcification of the aortic valve. There is moderate thickening of the aortic valve. Aortic valve regurgitation is not visualized. Aortic valve sclerosis/calcification is present, without any evidence of aortic stenosis.  7. Midsystolic notching of the pulmonary outflow tract velocity profile suggests severely elevated pulmonary vascular resistance.  8. The inferior vena cava is dilated in size with <50% respiratory variability, suggesting right atrial pressure of 15 mmHg. Comparison(s): Prior images reviewed side by side. Changes from prior study are noted. The right ventricular systolic function is significantly worse. The right ventricle is now severely enlarged, as is the right atrium. FINDINGS  Left Ventricle: Left ventricular ejection fraction, by estimation, is 65 to 70%. The left  ventricle has normal function. The left ventricle has no regional wall motion abnormalities. The left ventricular internal cavity size was normal in size. There is  no left ventricular hypertrophy. Left ventricular diastolic parameters are consistent with Grade I diastolic dysfunction (impaired relaxation). Normal left ventricular filling pressure. Right Ventricle: The right ventricular size is severely enlarged. Right vetricular wall thickness was not well visualized. Right ventricular systolic function is moderately reduced. There is mildly elevated pulmonary artery systolic pressure. The tricuspid regurgitant velocity is 2.57 m/s, and with an assumed right atrial pressure of 15 mmHg, the estimated right ventricular systolic pressure is 41.4 mmHg. Left Atrium: Left atrial size was moderately dilated. Right Atrium: Right atrial size was severely dilated. Pericardium: There is no evidence of pericardial effusion. Mitral Valve: The mitral valve is normal in structure. Mild to moderate mitral annular calcification. No evidence of mitral valve regurgitation. No evidence of mitral valve stenosis. Tricuspid Valve: The tricuspid valve is normal in structure. Tricuspid valve regurgitation is trivial. Aortic Valve: The aortic valve is tricuspid. There is moderate calcification of the aortic valve. There is moderate thickening of the aortic valve. Aortic valve regurgitation is not visualized. Aortic valve sclerosis/calcification is present, without any  evidence of aortic stenosis. Aortic valve mean gradient measures 4.0 mmHg. Aortic valve peak gradient measures 7.4 mmHg. Aortic valve area, by VTI measures 1.57 cm. Pulmonic Valve: Midsystolic notching of the pulmonary outflow tract velocity profile suggests severely elevated pulmonary vascular resistance. The pulmonic valve was grossly normal. Pulmonic valve regurgitation is trivial. Aorta: The aortic root is normal in size and structure. Venous: The inferior vena cava is  dilated in size with less than 50% respiratory variability, suggesting right atrial pressure of 15 mmHg. IAS/Shunts: No atrial level shunt detected by color flow Doppler.  LEFT VENTRICLE PLAX 2D LVIDd:         3.40 cm   Diastology LVIDs:         1.70 cm   LV e' medial:    5.33 cm/s LV PW:         1.10 cm   LV E/e' medial:  7.5 LV IVS:        0.80 cm   LV e' lateral:   7.51 cm/s LVOT diam:     1.94 cm   LV E/e' lateral: 5.3 LV SV:         47 LV SV Index:   34 LVOT Area:     2.96 cm  RIGHT VENTRICLE RV S prime:     10.00 cm/s TAPSE (M-mode): 1.2 cm LEFT ATRIUM             Index        RIGHT ATRIUM           Index LA Vol (A2C):   50.8 ml 36.85 ml/m  RA Area:     24.60 cm LA Vol (A4C):   58.6 ml 42.51 ml/m  RA Volume:   83.00 ml  60.21 ml/m LA Biplane Vol: 54.8 ml 39.76 ml/m  AORTIC VALVE AV Area (Vmax):    1.87 cm AV Area (Vmean):   1.59 cm AV Area (VTI):     1.57 cm AV Vmax:           136.00 cm/s AV Vmean:          98.300 cm/s AV VTI:            0.300 m AV Peak Grad:      7.4 mmHg AV Mean Grad:      4.0 mmHg LVOT Vmax:         86.20 cm/s LVOT Vmean:  52.900 cm/s LVOT VTI:          0.159 m LVOT/AV VTI ratio: 0.53 MITRAL VALVE               TRICUSPID VALVE MV Area (PHT): 3.17 cm    TR Peak grad:   26.4 mmHg MV Decel Time: 239 msec    TR Vmax:        257.00 cm/s MV E velocity: 39.80 cm/s MV A velocity: 90.00 cm/s  SHUNTS MV E/A ratio:  0.44        Systemic VTI:  0.16 m                            Systemic Diam: 1.94 cm Thurmon Fair MD Electronically signed by Thurmon Fair MD Signature Date/Time: 05/18/2023/9:59:53 AM    Final     Cardiac Studies   Echocardiogram 05/18/2023 1. Left ventricular ejection fraction, by estimation, is 65 to 70%. The  left ventricle has normal function. The left ventricle has no regional  wall motion abnormalities. Left ventricular diastolic parameters are  consistent with Grade I diastolic  dysfunction (impaired relaxation).   2. Right ventricular systolic function  is moderately reduced. The right  ventricular size is severely enlarged. There is mildly elevated pulmonary  artery systolic pressure.   3. Left atrial size was moderately dilated.   4. Right atrial size was severely dilated.   5. The mitral valve is normal in structure. No evidence of mitral valve  regurgitation. No evidence of mitral stenosis.   6. The aortic valve is tricuspid. There is moderate calcification of the  aortic valve. There is moderate thickening of the aortic valve. Aortic  valve regurgitation is not visualized. Aortic valve  sclerosis/calcification is present, without any evidence  of aortic stenosis.   7. Midsystolic notching of the pulmonary outflow tract velocity profile  suggests severely elevated pulmonary vascular resistance.   8. The inferior vena cava is dilated in size with <50% respiratory  variability, suggesting right atrial pressure of 15 mmHg.   Comparison(s): Prior images reviewed side by side. Changes from prior  study are noted. The right ventricular systolic function is significantly  worse. The right ventricle is now severely enlarged, as is the right atrium  Patient Profile     87 y.o. female hx of osteoarthritis, chronic back pain, history of colitis, lumbar vertebrae compression fracture, sacral fracture, COPD (chronically on 2-3L), delirium, hypertension, osteoporosis, history of multiple episodes of pneumonia , erosive esophagitis, chronic thrombocytosis (JAK2 genotyping) who was admitted on 05/19/2023 for the evaluation of new onset of right sided heart failure.   Assessment & Plan    New onset right-sided heart failure with small bilateral pleural effusions Patient has LVEF 65 to 70% with moderately reduced RV systolic function with severely enlarged RV.  Severely dilated RA.  RVSP 41.  She had midsystolic notching in the pulmonary outflow tract suggesting severely elevated pulmonary vascular resistance.  BNP over 1300+.  Her IVC was dilated  with less than 50% variability.  Currently is being diuresed on 40 mg of IV Lasix twice daily.  Previous echocardiogram in May 2020 was normal. Difficult to attribute her RV failure to L sided heart failure given no evidence LVH and normal LVEF. BP appears well controlled. Underlying cause of her RV failure is unknown at this time, could be attributed to chronic COPD, but question other causes. Would need right heart cath to better assess for pulmonary  hypertension however given her current condition would not be a candidate (AMS, frailty, CAP, anemia, possible melena).  Presently not a candidate for cardiac catheterization.  Appears euvolemic on exam today and dry, but with slight crackles noted in the left lung base.  Although within normal limits BUN has increased to 21 today with some decrease in renal function.  She will get morning dose of IV lasix dose, but will discontinue. Would DC on PO lasix 40mg  PRN.  CTA neg for PE Will get weights. I/O -1.2L last 24 hrs. Clinically looks dry.   New anterior/inferior T wave inversions Given mentation difficult to properly assess for any underlying ischemic causes and given above still would not be a good candidate for invasive procedures. If mentation and current condition improved can reevaluate.    HTN  Well controlled and not on any meds currently.    Hypokalemia  Likely due to poor oral intake.  Patient has refused potassium powder.   Altered mental status, combative Mentation improved today and able to have a conversation.  Still slightly confused.   Goals of care, poor prognosis, malnourishment, failure to thrive, noncompliance Talked with patient's son and he is agreeable to discussion for palliative care.  Prior notes state that she is still full code.  Patient's prognosis is very poor given noncompliance and poor oral intake.  Hopefully palliative care can see today.   Community-acquired pneumonia on 3 L  COPD on 2-3 chronically   Ureteritis, staghorn calculus Constipation Possible melena  Anemia  Mild ileus   For questions or updates, please contact  [facility] Please consult [email or web address] for contact info under  Signed, Abagail Kitchens, PA-C  05/20/2023, 8:13 AM     Patient seen and examined, note reviewed with the signed Advanced Practice Provider. I personally reviewed laboratory data, imaging studies and relevant notes. I independently examined the patient and formulated the important aspects of the plan. I have personally discussed the plan with the patient and/or family. Comments or changes to the note/plan are indicated below.  Patient seen examined her bedside.  Clinically appears to be euvolemic please stop Lasix.  Continue to replete electrolytes.  At this time no need for any ischemic evaluation.  echocardiogram showed normal left atrial systolic function with moderately reduced RV systolic function which she also shows severely enlarged RV noted pulmonary hypertension. Unclear etiology of her pulmonary heart hypertension suspect likely group 2 due to chronic COPD. A not quite sure how much a comprehensive evaluation for pulmonary hypertension will serve at this time especially given this should be an outpatient workup.   Ideally with multiple comorbidities including significant failure to thrive suspected closer to end-stage palliative care consultation is very appropriate at this time.    Thomasene Ripple DO, MS [facility] Attending Cardiologist [facility]  [address] [place], [place] [address] [phone] Website: [email or web address]

## 2023-05-20 NOTE — Consult Note (Signed)
Palliative Care Consult Note                                  Date: 05/20/2023   Patient Name: [name]  DOB: 12-10-1936  MRN: [id number]  Age / Sex: 87 y.o., female  PCP: Annita Brod, MD Referring Physician: Standley Brooking, MD  Reason for Consultation: Establishing goals of care  HPI/Patient Profile: 87 y.o. female  with past medical history of COPD and chronic respiratory failure on home O2, arthritis, osteoporosis, and HTN.  She presented to the ED on 05/14/2023 with constipation and left lower quadrant pain.  Admitted for acute on chronic respiratory failure with hypoxia and pneumonia.  She was also found to have new right-sided heart failure.  Palliative Medicine was consulted for goals of care.   Subjective:   I have reviewed medical records including progress notes, labs and imaging, and discussed with Dr. Irene Limbo.  Patient's son had previously (5/20) declined palliative involvement, but today was agreeable.  I met with patient at bedside to discuss diagnosis, prognosis, GOC, EOL wishes, disposition, and options.  She is alert and oriented.  She tells me she is "not good" but is not able to specify a specific complaint.  She is able to tell me she is in the hospital with pneumonia and that she has underlying COPD.  I introduced Palliative Medicine as specialized medical care for people living with serious illness. It focuses on providing relief from the symptoms and stress of a serious illness.   We discussed patient's current illness and what it means in the larger context of his/her ongoing co-morbidities. Current clinical status was reviewed. Natural disease trajectory of chronic illness was discussed.  Created space and opportunity for patient and family to explore thoughts and feelings regarding current medical situation.Values and goals of care were attempted to be elicited.  A discussion was had today regarding  advanced directives. Concepts specific to code status, artifical feeding and hydration, continued IV antibiotics and rehospitalization was had.  The MOST form was introduced and discussed.   Life Review: [name] is originally from the [place] area.  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  She separated from her husband after 22 years of marriage.  They shared 2 sons, [name] and [name].  Sadly [name] passed away 2 years ago.  Functional Status: [name] lives in her home, and her son [name] lives with her.  At baseline, she was ambulatory (for short distances) with a rollator. She could prepare simple foods for herself.  She does need assistance with bathing.  GOC Discussion: Jeidi shares that she is depressed and that her quality of life is not good. However, she has a difficult time expressing her goals of care and treatment preferences.  She verbalizes understanding that she is debilitated and chronically ill, but was not willing to engage in any meaningful conversation around end-of-life. She states she "has never thought about this". She also seems to be interested in all offered and full scope medical interventions to prolong life. When I offered education and conversation around code status, she seems confused regarding this concept. She eventually does state she would want to be on life support because she "doesn't want to die".   Of note, Kateri continues to have some mild/intermittent paranoia. During our discussion, she asks "why are are you taking notes?",  and "why are you talking to me out of all the people in the hospital?".  ____________________________________________________________________________________  I later spoke with son/[name] by phone. Introduced myself and the role of palliative care. I summarized the conversation (as be above) that I had with his mother. He verbalizes understanding that she is chronically ill and that her  functional status has declined. His goal for her at this time is rehab to improve functional status enough to be able to return home. He is receptive to a basic discussion about GOC, scope of treatment, and advanced care planning. I encouraged him to continue discussions with his mother, so that he will know what her wishes are in the event of decline.   [name] is appreciative of the phone call. At bedside I left a "hard choices" book, MOST form, and PMT contact card. I encouraged him to call with questions or concerns.    Review of Systems  Respiratory:  Positive for shortness of breath.   Neurological:  Positive for weakness.    Objective:   Primary Diagnoses: Present on Admission:  CAP (community acquired pneumonia)  COPD (chronic obstructive pulmonary disease) (HCC)  Hypertension  Acute on chronic respiratory failure with hypoxia (HCC)  Normocytic anemia  Depression  Constipation  Ureteritis   Physical Exam Vitals reviewed.  Constitutional:      Comments: Frail and chronically ill-appearing  Cardiovascular:     Rate and Rhythm: Normal rate.  Pulmonary:     Effort: Pulmonary effort is normal.  Neurological:     Mental Status: She is alert and oriented to person, place, and time.     Motor: Weakness present.     Vital Signs:  BP (!) 114/59   Pulse 83   Temp (!) 97.5 F (36.4 C) (Oral)   Resp 18   Ht 5' (1.524 m)   Wt 44.5 kg   SpO2 94%   BMI 19.14 kg/m   Palliative Assessment/Data: PPS 30%     Assessment & Plan:   SUMMARY OF RECOMMENDATIONS   Continue full scope care Goal of care - medical stabilization and rehab with the hope to improve functional status PMT will continue to follow  Primary Decision Maker: PATIENT, but would recommend involvement of son/[name] for any major or complex medical decision.  [name] is patient's only next of kin.  Code Status/Advance Care Planning: Full code  Prognosis:  Unable to determine  Discharge Planning:   SNF/rehab  Discussed with: Dr. Irene Limbo   Thank you for allowing [place] to participate in the care of [name]  MDM - High  Signed by: Sherlean Foot, NP Palliative Medicine Team  Team Phone # [phone]  For individual providers, please see AMION

## 2023-05-21 DIAGNOSIS — J9621 Acute and chronic respiratory failure with hypoxia: Secondary | ICD-10-CM | POA: Diagnosis not present

## 2023-05-21 DIAGNOSIS — J449 Chronic obstructive pulmonary disease, unspecified: Secondary | ICD-10-CM | POA: Diagnosis not present

## 2023-05-21 DIAGNOSIS — J189 Pneumonia, unspecified organism: Secondary | ICD-10-CM | POA: Diagnosis not present

## 2023-05-21 LAB — BASIC METABOLIC PANEL
Anion gap: 12 (ref 5–15)
BUN: 20 mg/dL (ref 8–23)
CO2: 35 mmol/L — ABNORMAL HIGH (ref 22–32)
Calcium: 8.2 mg/dL — ABNORMAL LOW (ref 8.9–10.3)
Chloride: 88 mmol/L — ABNORMAL LOW (ref 98–111)
Creatinine, Ser: 0.73 mg/dL (ref 0.44–1.00)
GFR, Estimated: >60 mL/min (ref >60)
Glucose, Bld: 134 mg/dL — ABNORMAL HIGH (ref 70–99)
Potassium: 4 mmol/L (ref 3.5–5.1)
Sodium: 135 mmol/L (ref 135–145)

## 2023-05-21 MED ORDER — MELATONIN 3 MG PO TABS
6.0000 mg | ORAL_TABLET | Freq: Once | ORAL | Status: AC
  Administered 2023-05-21: 6 mg via ORAL
  Filled 2023-05-21: qty 2

## 2023-05-21 NOTE — Progress Notes (Signed)
Physical Therapy Treatment Patient Details Name: RIKA BEAM MRN: [id number] DOB: May 20, 1936 Today's Date: 05/21/2023   History of Present Illness ADINA MCMORRAN is a 87 y.o. female with medical history significant of osteoarthritis, chronic back pain, history of colitis, lumbar vertebrae compression fracture, sacral fracture, COPD, delirium, hypertension, osteoporosis, history of multiple episodes of pneumonia who presented to the emergency department complaints of constipation associated with LLQ pain. Patient admitted for acute on chronic respiratory failure seconday to pneumonia.    PT Comments    Pt needing verbal cues for back protection with bed mobility, able to mobilize more but needing min A to roll and mod A for supine<>sit transfer. Pt tolerates seated exercises EOB with fair sitting balance. Pt performs 1 STS but noted to be soiled in BM, unable to tolerate standing pericare and unable to transfer to [facility] so assisted back to supine. Pt rolling R/L with min A for pericare and linen change; cues for reaching for bedrail and LE placement with good carryover. Pt appears overall more alert and oriented today, following 1 step commands consistently and reminiscing on prior job and frustrated with current functional status.    Recommendations for follow up therapy are one component of a multi-disciplinary discharge planning process, led by the attending physician.  Recommendations may be updated based on patient status, additional functional criteria and insurance authorization.  Follow Up Recommendations  Can patient physically be transported by private vehicle: No    Assistance Recommended at Discharge Frequent or constant Supervision/Assistance  Patient can return home with the following A lot of help with walking and/or transfers;A lot of help with bathing/dressing/bathroom;Assist for transportation;Assistance with cooking/housework;Help with stairs or ramp for entrance   Equipment  Recommendations  None recommended by PT    Recommendations for Other Services       Precautions / Restrictions Precautions Precautions: Fall Precaution Comments: monitor O2 Restrictions Weight Bearing Restrictions: No     Mobility  Bed Mobility Overal bed mobility: Needs Assistance Bed Mobility: Rolling Rolling: Min assist  Supine to sit: Mod assist Sit to supine: Mod assist  General bed mobility comments: educated pt on log rolling to protect back, able to inch BLE towards EOB and roll into sidelying with min A, mod A to upright trunk into sitting and scoot out to EOB with bedpad; mod A to lift BLE back into bed and reposition to comfort; min A for rolling R/L to assist with pericare    Transfers Overall transfer level: Needs assistance Equipment used: Rolling walker (2 wheels) Transfers: Sit to/from Stand Sit to Stand: Max assist  General transfer comment: max A to power up to standing with RW, verbal cues for hand placement and widening BOS, pt noted to be soiled with BM upon rising, pt unable to tolerate standing to assist with pericare so assisted back to supine    Ambulation/Gait    Stairs             Wheelchair Mobility    Modified Rankin (Stroke Patients Only)       Balance Overall balance assessment: Needs assistance Sitting-balance support: Feet supported Sitting balance-Leahy Scale: Fair Sitting balance - Comments: seated EOB   Standing balance support: Reliant on assistive device for balance, During functional activity, Bilateral upper extremity supported Standing balance-Leahy Scale: Poor     Cognition Arousal/Alertness: Awake/alert Behavior During Therapy: WFL for tasks assessed/performed Overall Cognitive Status: No family/caregiver present to determine baseline cognitive functioning  General Comments: pt awake, alert and oriented to  self, city and hospital, pt able to follow 1 step commands consistently        Exercises General  Exercises - Lower Extremity Long Arc Quad: Seated, AROM, Strengthening, Both, 10 reps Hip Flexion/Marching: Seated, AROM, Strengthening, Both, 10 reps    General Comments        Pertinent Vitals/Pain Pain Assessment Pain Assessment: No/denies pain    Home Living                          Prior Function            PT Goals (current goals can now be found in the care plan section) Acute Rehab PT Goals PT Goal Formulation: Patient unable to participate in goal setting Time For Goal Achievement: 05/30/23 Potential to Achieve Goals: Fair Progress towards PT goals: Progressing toward goals    Frequency    Min 1X/week      PT Plan Current plan remains appropriate    Co-evaluation              AM-PAC PT "6 Clicks" Mobility   Outcome Measure  Help needed turning from your back to your side while in a flat bed without using bedrails?: A Little Help needed moving from lying on your back to sitting on the side of a flat bed without using bedrails?: A Lot Help needed moving to and from a bed to a chair (including a wheelchair)?: A Lot Help needed standing up from a chair using your arms (e.g., wheelchair or bedside chair)?: A Lot Help needed to walk in hospital room?: Total Help needed climbing 3-5 steps with a railing? : Total 6 Click Score: 11    End of Session Equipment Utilized During Treatment: Oxygen;Gait belt Activity Tolerance: Patient tolerated treatment well Patient left: in bed;with call bell/phone within reach;with bed alarm set Nurse Communication: Mobility status PT Visit Diagnosis: Unsteadiness on feet (R26.81);Muscle weakness (generalized) (M62.81)     Time: 1610-9604 PT Time Calculation (min) (ACUTE ONLY): 34 min  Charges:  $Therapeutic Exercise: 8-22 mins $Therapeutic Activity: 8-22 mins                      Tori Robbie Rideaux PT, DPT 05/21/23, 1:23 PM

## 2023-05-21 NOTE — Progress Notes (Signed)
Occupational Therapy Treatment Patient Details Name: [name] MRN: [id number] DOB: 1936-12-21 Today's Date: 05/21/2023   History of present illness [name] is a 87 y.o. female with medical history significant of osteoarthritis, chronic back pain, history of colitis, lumbar vertebrae compression fracture, sacral fracture, COPD, delirium, hypertension, osteoporosis, history of multiple episodes of pneumonia who presented to the emergency department complaints of constipation associated with LLQ pain. Patient admitted for acute on chronic respiratory failure seconday to pneumonia.   OT comments  The pt occasionally required increased time and effort for progressive activity, given deconditioning and compromised endurance. She demonstrated fair sitting edge of bed, and poor standing balance. She required increased assist for sit to stand, as well as for taking lateral steps towards the head of the bed using a RW. She needed intermittent therapeutic rest breaks, as well as reinforcement on her abilities. She will continue to benefit from further OT services to facilitate improved ADL performance and to decrease the risk for further deconditioning.    Recommendations for follow up therapy are one component of a multi-disciplinary discharge planning process, led by the attending physician.  Recommendations may be updated based on patient status, additional functional criteria and insurance authorization.    Assistance Recommended at Discharge Frequent or constant Supervision/Assistance  Patient can return home with the following  A lot of help with bathing/dressing/bathroom;Assistance with cooking/housework;A lot of help with walking and/or transfers;Direct supervision/assist for medications management   Equipment Recommendations  None recommended by OT       Precautions / Restrictions Precautions Precautions: Fall Precaution Comments: monitor O2 Restrictions Weight Bearing Restrictions:  No       Mobility Bed Mobility Overal bed mobility: Needs Assistance Bed Mobility: Supine to Sit, Sit to Supine, Rolling Rolling: Min assist   Supine to sit: Mod assist Sit to supine: Mod assist        Transfers Overall transfer level: Needs assistance Equipment used: Rolling walker (2 wheels) Transfers: Sit to/from Stand Sit to Stand: Mod assist           General transfer comment: The pt required cues for increasing BLE base of support in standing and to correct occasional posterior lean. She was instructed on lateral stepping along the EOB using a RW, for which she required mod assist, cues for improved posture, and assist for walker advancement         ADL either performed or assessed with clinical judgement   ADL Overall ADL's : Needs assistance/impaired Eating/Feeding: Set up;Sitting Eating/Feeding Details (indicate cue type and reason): based on clinical judgement Grooming: Bed level;Minimal assistance;Sitting           Upper Body Dressing : Moderate assistance;Sitting   Lower Body Dressing: Maximal assistance;Sit to/from stand       Toileting- Architect and Hygiene: Maximal assistance;Bed level                Cognition Arousal/Alertness: Awake/alert Behavior During Therapy: WFL for tasks assessed/performed        General Comments: pt awake, alert and oriented to self, city and hospital, pt able to follow 1 step commands consistently                   Pertinent Vitals/ Pain       Pain Assessment Pain Assessment: No/denies pain         Frequency  Min 1X/week        Progress Toward Goals  OT Goals(current goals can now be found  in the care plan section)  Progress towards OT goals: Progressing toward goals  Acute Rehab OT Goals Patient Stated Goal: to get better and return home OT Goal Formulation: With patient Time For Goal Achievement: 05/30/23 Potential to Achieve Goals: Fair  Plan Discharge plan remains  appropriate       AM-PAC OT "6 Clicks" Daily Activity     Outcome Measure   Help from another person eating meals?: None Help from another person taking care of personal grooming?: A Little Help from another person toileting, which includes using toliet, bedpan, or urinal?: Total Help from another person bathing (including washing, rinsing, drying)?: A Lot Help from another person to put on and taking off regular upper body clothing?: A Lot Help from another person to put on and taking off regular lower body clothing?: A Lot 6 Click Score: 14    End of Session Equipment Utilized During Treatment: Oxygen;Gait belt;Rolling walker (2 wheels)  OT Visit Diagnosis: Muscle weakness (generalized) (M62.81);Unsteadiness on feet (R26.81)   Activity Tolerance Other (comment) (Fair tolerance. Limited by compromised endurance)   Patient Left in bed;with call bell/phone within reach;with bed alarm set   Nurse Communication Mobility status        Time: 6063-0160 OT Time Calculation (min): 24 min  Charges: OT General Charges $OT Visit: 1 Visit OT Treatments $Therapeutic Activity: 23-37 mins     Reuben Likes, OTR/L 05/21/2023, 2:48 PM

## 2023-05-21 NOTE — TOC Progression Note (Addendum)
Transition of Care [facility]) - Progression Note    Patient Details  Name: [name] MRN: [id number] Date of Birth: 12-19-1936  [facility] CM/SW Contact  Larrie Kass, LCSW Phone Number: 05/21/2023, 1:48 PM  Clinical Narrative:    CSW met with pt and pt's son to discuss, SNF recommendations. Pt and pt's son has agreed , CSW presented bed offers. They are requesting time to review. Pt's PASRR is pending, clinicals uploaded. TOC to follow.  3:50pm CSW spoke with pt and pt's son they have chosen [facility].pt's PASRR was assigned [id number] A. CSW to start insurance auth. TOC to follow.   4:35pm Pt's Berkley Harvey was approved can d/c to [facility] in the morning. Please call Breanna with admissions. TOC to follow.    Expected Discharge Plan:  (TBD) Barriers to Discharge: Continued Medical Work up  Expected Discharge Plan and Services                                               Social Determinants of Health (SDOH) Interventions SDOH Screenings   Food Insecurity: No Food Insecurity (05/14/2023)  Housing: Low Risk  (05/14/2023)  Transportation Needs: No Transportation Needs (05/14/2023)  Utilities: Not At Risk (05/14/2023)  Depression (PHQ2-9): Low Risk  (06/08/2019)  Tobacco Use: Medium Risk (05/14/2023)    Readmission Risk Interventions    02/16/2022   11:24 AM  Readmission Risk Prevention Plan  Post Dischage Appt Complete  Medication Screening Complete  Transportation Screening Complete

## 2023-05-21 NOTE — Progress Notes (Addendum)
Progress Note  Patient Name: [name] Date of Encounter: 05/21/2023  Primary Cardiologist: Thomasene Ripple, DO  Subjective   States she doesn't feel any worse today. Breathing about the same. When asked the date she says it's 1951. No CP. Hoarse voice today.  Inpatient Medications    Scheduled Meds:  calcitonin (salmon)  1 spray Alternating Nares Daily   calcium carbonate  400 mg of elemental calcium Oral BID   cholecalciferol  1,000 Units Oral Daily   clotrimazole   Topical BID   DULoxetine  60 mg Oral Daily   ferrous gluconate  324 mg Oral QODAY   ipratropium-albuterol  3 mL Nebulization BID   lidocaine  3 patch Transdermal Q24H   pantoprazole (PROTONIX) IV  40 mg Intravenous Q12H   polyethylene glycol  17 g Oral TID   senna-docusate  1 tablet Oral BID   thiamine  100 mg Oral Daily   Continuous Infusions:  PRN Meds: albuterol, HYDROmorphone (DILAUDID) injection, ondansetron **OR** ondansetron (ZOFRAN) IV, mouth rinse, mouth rinse, oxyCODONE, sorbitol, milk of mag, mineral oil, glycerin (SMOG) enema   Vital Signs    Vitals:   05/20/23 2058 05/21/23 0500 05/21/23 0817 05/21/23 1000  BP:  133/79    Pulse:  77    Resp:  18    Temp:  97.9 F (36.6 C)    TempSrc:  Oral    SpO2: 97% 100% 93%   Weight:    35.1 kg  Height:        Intake/Output Summary (Last 24 hours) at 05/21/2023 1004 Last data filed at 05/21/2023 0500 Gross per 24 hour  Intake 440 ml  Output 2500 ml  Net -2060 ml      05/21/2023   10:00 AM 05/17/2023    5:44 AM 02/14/2022    4:54 PM  Last 3 Weights  Weight (lbs) 77 lb 6.4 oz 98 lb 83 lb 12.4 oz  Weight (kg) 35.108 kg 44.453 kg 38 kg     Telemetry    NSR - Personally Reviewed  ECG    No new tracings - Personally Reviewed  Physical Exam   GEN: No acute distress. Cachetic appearing HEENT: Normocephalic, atraumatic, sclera non-icteric. Hoarse voice Neck: No JVD or bruits. Cardiac: RRR no murmurs, rubs, or gallops.  Respiratory: Clear  to auscultation bilaterally. Breathing is unlabored. GI: Soft, nontender, non-distended, BS +x 4. MS: no deformity. Extremities: No clubbing or cyanosis. No edema. Distal pedal pulses are 2+ and equal bilaterally. Neuro:  A+O to self, WL, not date. Follows commands. Psych:  Responds to questions appropriately with a normal affect.  Labs    High Sensitivity Troponin:  No results for input(s): "TROPONINIHS" in the last 720 hours.    Cardiac EnzymesNo results for input(s): "TROPONINI" in the last 168 hours. No results for input(s): "TROPIPOC" in the last 168 hours.   Chemistry Recent Labs  Lab 05/16/23 0401 05/17/23 0417 05/18/23 0435 05/19/23 0835 05/20/23 0427  NA 140 141 141 139 137  K 4.2 3.9 3.2* 3.2* 3.4*  CL 104 106 94* 82* 82*  CO2 28 29 36* 44* 42*  GLUCOSE 195* 154* 102* 104* 118*  BUN 18 15 10 11 21   CREATININE 0.70 0.80 0.66 0.68 0.86  CALCIUM 8.1* 8.0* 7.7* 8.0* 8.1*  PROT 6.2* 6.2* 6.3*  --   --   ALBUMIN 2.5* 2.5* 2.9*  --   --   AST 14* 49* 20  --   --   ALT 9 28  20  --   --   ALKPHOS 54 51 44  --   --   BILITOT 0.5 0.3 0.6  --   --   GFRNONAA >60 >60 >60 >60 >60  ANIONGAP 8 6 11 13 13      Hematology Recent Labs  Lab 05/17/23 0417 05/17/23 0902 05/17/23 1526 05/18/23 0435 05/19/23 0835  WBC 7.9  --   --  7.1 10.7*  RBC 3.22*  --   --  3.25* 4.03  HGB 9.3*   < > 9.2* 9.2* 11.2*  HCT 31.1*   < > 32.3* 31.2* 37.5  MCV 96.6  --   --  96.0 93.1  MCH 28.9  --   --  28.3 27.8  MCHC 29.9*  --   --  29.5* 29.9*  RDW 13.9  --   --  13.8 13.6  PLT 245  --   --  253 378   < > = values in this interval not displayed.    BNP Recent Labs  Lab 05/17/23 1040  BNP 1,307.6*     DDimer No results for input(s): "DDIMER" in the last 168 hours.   Radiology    No results found.  Cardiac Studies   2D echo 05/18/23   1. Left ventricular ejection fraction, by estimation, is 65 to 70%. The  left ventricle has normal function. The left ventricle has no  regional  wall motion abnormalities. Left ventricular diastolic parameters are  consistent with Grade I diastolic  dysfunction (impaired relaxation).   2. Right ventricular systolic function is moderately reduced. The right  ventricular size is severely enlarged. There is mildly elevated pulmonary  artery systolic pressure.   3. Left atrial size was moderately dilated.   4. Right atrial size was severely dilated.   5. The mitral valve is normal in structure. No evidence of mitral valve  regurgitation. No evidence of mitral stenosis.   6. The aortic valve is tricuspid. There is moderate calcification of the  aortic valve. There is moderate thickening of the aortic valve. Aortic  valve regurgitation is not visualized. Aortic valve  sclerosis/calcification is present, without any evidence  of aortic stenosis.   7. Midsystolic notching of the pulmonary outflow tract velocity profile  suggests severely elevated pulmonary vascular resistance.   8. The inferior vena cava is dilated in size with <50% respiratory  variability, suggesting right atrial pressure of 15 mmHg.   Comparison(s): Prior images reviewed side by side. Changes from prior  study are noted. The right ventricular systolic function is significantly  worse. The right ventricle is now severely enlarged, as is the right  atrium.   Patient Profile     87 y.o. female with osteoarthritis, chronic back pain, history of colitis, lumbar vertebrae compression fracture, sacral fracture, COPD with chronic respiratory failure chronically on 2-3L, delirium, hypertension, osteoporosis, history of multiple episodes of pneumonia, erosive esophagitis, chronic thrombocytosis (JAK2 genotyping). Admitted 05/14/23 with respiratory failure, constipation, poor oral intake, LLQ pain. Found to have CAP, large staghorn calculus in the left renal collecting system and possible ureteritis and possible mild ileus, some concern for melena. Found to have AMS,  combative. Cardiology consulted for new right heart failure.  Assessment & Plan    1. Multiple medical issues to include a/c respiratory failure with hypoxia, strep pneumoniae pneumonia - additionally acute metabolic encelopathy, ureteritis, staghorn calculus, constipation, abdominal wall hernia, severe compression fx T7, dark stool but stable H/H, pressure ulcer - concern noted for malnourishment, failure to  thrive, episodic refusal of meds (I.e. potassium) - primary team consulting palliative team  2. New onset right sided heart failure - 2D Echo 05/18/23 EF 65-70%, G1DD, moderately reduced RVSF with severely enlarged RV, mildly elevated PASP - mid systolic notching of PV outflow tract suggesting severely elevated PVR - CTA negative for PE; etiology felt potentially related to significant h/o COPD/chronic respiratory failure exacerbated by pulmonary stressors of CAP, pleural effusions - difficult to attribute RV failure to left sided heart failure given no clinical history of such, normal LVEF - not a good candidate for RHC - per Dr. Servando Salina "not quite sure how much a comprehensive evaluation for pulmonary hypertension will serve at this time especially given this should be an outpatient workup" - received IV Lasix through 05/20/23, stopped due to euvolemia - net total -481 but not sure if fully tracked (had 2.5L UOP yesterday) - will review plans for oral dosing with MD, perhaps PRN going forward  3. Abnormal EKG - nonspecific TW changes, no chest pain - no ischemic evaluation planned  4. Hypokalemia  - repletion per medicine team; patient had been refusing oral dosing so has gotten sporadic IV dosing  Remainder of medical issues per IM Tentatively arranged f/u 6/11 with cardiology APP, but recommend closer 1 week f/u with PCP upon DC.  For questions or updates, please contact [facility] Please consult [email or web address] for contact info under Cardiology/STEMI.  Signed, Laurann Montana, PA-C 05/21/2023, 10:04 AM    Patient seen and examined, note reviewed with the signed Advanced Practice Provider. I personally reviewed laboratory data, imaging studies and relevant notes. I independently examined the patient and formulated the important aspects of the plan. I have personally discussed the plan with the patient and/or family. Comments or changes to the note/plan are indicated below.   Patient seen examined her bedside.  Clinically appears to be euvolemi, at the time of discharge could not benefit from cautious diuretics in the outpatient setting Lasix 20 mg weekly.  Continue to replete electrolytes.   At this time no need for any ischemic evaluation.   echocardiogram showed normal left atrial systolic function with moderately reduced RV systolic function which she also shows severely enlarged RV noted pulmonary hypertension. Unclear etiology of her pulmonary heart hypertension suspect likely group 2 due to chronic COPD. A not quite sure how much a comprehensive evaluation for pulmonary hypertension will serve at this time especially given this should be an outpatient workup.    Ideally with multiple comorbidities including significant failure to thrive suspected closer to end-stage palliative care consultation is very appropriate at this time.   Will sign off at this time.     Thomasene Ripple DO, MS [facility] Attending Cardiologist [facility]  [address] [place], [place] [address] [phone] Website: [email or web address]

## 2023-05-21 NOTE — Progress Notes (Addendum)
  Progress Note   Patient: [name] ZOX:[id number] DOB: Jun 02, 1936 DOA: 05/14/2023     7 DOS: the patient was seen and examined on 05/21/2023   Brief hospital course: 86yow presented with constipation and left lower quadrant pain.  Admitted for acute on chronic respiratory failure with hypoxia, pneumonia.  Assessment and Plan: Acute on chronic respiratory failure with hypoxia (HCC) on 2 to 3 L at home Streptococcus pneumoniae pneumonia COPD (chronic obstructive pulmonary disease) (HCC) At baseline oxygen requirement.  COPD appears to be stable.  Completed antibiotics   New diagnosis right-sided systolic heart failure, acute on chronic; acute on chronic diastolic CHF Pulmonary hypertension etiology unknown but thought group 2 likely secondary to chronic COPD. Based on 2D echocardiogram.  CT negative for PE. Seen by cardiology.  Medical management recommended.  Stopped Lasix.  Creatinine stable. DC on PO lasix 20 mg as needed Follow-up with cardiology June 11  Dysphagia, unspecified (R13.10)  Diet recommendations: Dysphagia 3 (mechanical soft);Thin liquid (per pt wishes)    Acute metabolic encephalopathy Appears resolved Workup unrevealing.   Ureteritis Staghorn calculus No urine culture was sent Follow-up with urology as an outpatient.  Renal function preserved.   Constipation Last bowel movement 5/23.  Continue bowel regimen.   Normocytic anemia Monitor hematocrit and hemoglobin.  Hemoglobin stable.   Abdominal wall hernia Follow-up with general surgery as an outpatient   Severe compression fracture at T7 Chronicity unclear.  Supportive care.   Chart records concern for melena.  The genesis for this concern appears to be an RN note from 5/20, which noted a dark stool but no visible blood.  Hemoglobin is stable. No fecal occult blood was ordered.  No further evaluation suggested.   Scapholunate Ligament Tear, Hyperextension of MCP Joint of thumb and generative  arthropathy and possible subluxation of First carpometacarpal articulation Follow outpatient    Goals of care.  Full code   Pressure injury stage 1 sacrum present on admission (at the time of the admission order)    Stable for transfer to SNF      Subjective:  Feels weak  Physical Exam: Vitals:   05/20/23 2000 05/20/23 2058 05/21/23 0500 05/21/23 0817  BP: 110/64  133/79   Pulse: 90  77   Resp: 17  18   Temp: 98 F (36.7 C)  97.9 F (36.6 C)   TempSrc: Oral  Oral   SpO2: 100% 97% 100% 93%  Weight:      Height:       Physical Exam Vitals reviewed.  Constitutional:      General: She is not in acute distress.    Appearance: She is not ill-appearing or toxic-appearing.  Cardiovascular:     Rate and Rhythm: Normal rate and regular rhythm.     Heart sounds: No murmur heard. Pulmonary:     Effort: Pulmonary effort is normal. No respiratory distress.     Breath sounds: No wheezing, rhonchi or rales.  Neurological:     Mental Status: She is alert.  Psychiatric:        Mood and Affect: Mood normal.        Behavior: Behavior normal.   , Data Reviewed: No new data  Family Communication: son at bedside  Disposition: Status is: Inpatient Remains inpatient appropriate because: await SNF  Planned Discharge Destination: Skilled nursing facility    Time spent: 25 minutes  Author: Brendia Sacks, MD 05/21/2023 8:46 AM  For on call review [email or web address].

## 2023-05-21 NOTE — Care Management Important Message (Signed)
Important Message  Patient Details IM Letter given. Name: [name] MRN: [id number] Date of Birth: 1936/07/13   Medicare Important Message Given:  Yes     Caren Macadam 05/21/2023, 1:45 PM

## 2023-05-22 DIAGNOSIS — J189 Pneumonia, unspecified organism: Secondary | ICD-10-CM | POA: Diagnosis not present

## 2023-05-22 DIAGNOSIS — N200 Calculus of kidney: Secondary | ICD-10-CM | POA: Diagnosis not present

## 2023-05-22 DIAGNOSIS — J449 Chronic obstructive pulmonary disease, unspecified: Secondary | ICD-10-CM | POA: Diagnosis not present

## 2023-05-22 DIAGNOSIS — J9621 Acute and chronic respiratory failure with hypoxia: Secondary | ICD-10-CM | POA: Diagnosis not present

## 2023-05-22 MED ORDER — FUROSEMIDE 20 MG PO TABS: 20.0000 mg | ORAL_TABLET | ORAL | Status: AC | PRN

## 2023-05-22 MED ORDER — SENNOSIDES-DOCUSATE SODIUM 8.6-50 MG PO TABS: 1.0000 | ORAL_TABLET | Freq: Two times a day (BID) | ORAL | Status: AC

## 2023-05-22 MED ORDER — LIDOCAINE 5 % EX PTCH: 1.0000 | MEDICATED_PATCH | CUTANEOUS | Status: AC

## 2023-05-22 MED ORDER — OXYCODONE HCL 5 MG PO TABS: 2.5000 mg | ORAL_TABLET | Freq: Four times a day (QID) | ORAL | 0 refills | Status: AC | PRN

## 2023-05-22 MED ORDER — ORAL CARE MOUTH RINSE: 15.0000 mL | OROMUCOSAL | Status: DC | PRN

## 2023-05-22 MED ORDER — OXYCODONE HCL 5 MG PO TABS: 2.5000 mg | ORAL_TABLET | Freq: Four times a day (QID) | ORAL | 0 refills | Status: DC | PRN

## 2023-05-22 MED ORDER — VITAMIN B-1 100 MG PO TABS: 100.0000 mg | ORAL_TABLET | Freq: Every day | ORAL | Status: AC

## 2023-05-22 MED ORDER — VITAMIN D3 25 MCG PO TABS: 1000.0000 [IU] | ORAL_TABLET | Freq: Every day | ORAL | Status: AC

## 2023-05-22 MED ORDER — ORAL CARE MOUTH RINSE
15.0000 mL | OROMUCOSAL | Status: DC
  Administered 2023-05-22 (×3): 15 mL via OROMUCOSAL

## 2023-05-22 MED ORDER — POLYETHYLENE GLYCOL 3350 17 G PO PACK: 17.0000 g | PACK | Freq: Three times a day (TID) | ORAL | 0 refills | Status: AC | PRN

## 2023-05-22 NOTE — TOC Progression Note (Signed)
Transition of Care [facility]) - Progression Note    Patient Details  Name: [name] MRN: [id number] Date of Birth: 07-Jun-1936  [facility] CM/SW Contact  Carmina Miller, LCSWA Phone Number: 05/22/2023, 9:47 AM  Clinical Narrative:     CSW attempted to reach [name] to confirm pt dc today, no answer, vm left.   Expected Discharge Plan:  (TBD) Barriers to Discharge: Continued Medical Work up  Expected Discharge Plan and Services                                               Social Determinants of Health (SDOH) Interventions SDOH Screenings   Food Insecurity: No Food Insecurity (05/14/2023)  Housing: Low Risk  (05/14/2023)  Transportation Needs: No Transportation Needs (05/14/2023)  Utilities: Not At Risk (05/14/2023)  Depression (PHQ2-9): Low Risk  (06/08/2019)  Tobacco Use: Medium Risk (05/14/2023)    Readmission Risk Interventions    02/16/2022   11:24 AM  Readmission Risk Prevention Plan  Post Dischage Appt Complete  Medication Screening Complete  Transportation Screening Complete

## 2023-05-22 NOTE — Progress Notes (Signed)
Patient left facility via stretcher. Alert and oriented. 4L/. Son, [name], notified that patient is leaving and going to [employer]. All belongings sent with patient, including glasses which patient is wearing. V/S stable. This RN attempted to call [facility] to give updates, but there was no answer.

## 2023-05-22 NOTE — TOC Transition Note (Signed)
Transition of Care [facility]) - CM/SW Discharge Note   Patient Details  Name: PESSY CHEATHAM MRN: [id number] Date of Birth: Sep 15, 1936  [facility]) CM/SW Contact:  Carmina Miller, LCSWA Phone Number: 05/22/2023, 1:58 PM   Clinical Narrative:     [facility] will [facility] Anticipated DC date: 05/22/23 Family notified: Son, Kathlene November Transport by: Sharin Mons   Per MD patient ready for DC to [facility]. RN to call report prior to discharge [phone]. RN, patient, patient's family, and facility notified of DC. Discharge Summary and FL2 sent to facility. DC packet on chart. Ambulance transport requested for patient.   CSW will sign off for now as social work intervention is no longer needed. Please consult [place] again if new needs arise.      Barriers to Discharge: Continued Medical Work up   Patient Goals and CMS Choice      Discharge Placement                         Discharge Plan and Services Additional resources added to the After Visit Summary for                                       Social Determinants of Health (SDOH) Interventions SDOH Screenings   Food Insecurity: No Food Insecurity (05/14/2023)  Housing: Low Risk  (05/14/2023)  Transportation Needs: No Transportation Needs (05/14/2023)  Utilities: Not At Risk (05/14/2023)  Depression (PHQ2-9): Low Risk  (06/08/2019)  Tobacco Use: Medium Risk (05/14/2023)     Readmission Risk Interventions    02/16/2022   11:24 AM  Readmission Risk Prevention Plan  Post Dischage Appt Complete  Medication Screening Complete  Transportation Screening Complete

## 2023-05-22 NOTE — Plan of Care (Signed)
  Problem: Activity: Goal: Ability to tolerate increased activity will improve Outcome: Progressing   Problem: Safety: Goal: Ability to remain free from injury will improve Outcome: Progressing   Problem: Skin Integrity: Goal: Risk for impaired skin integrity will decrease Outcome: Progressing   Problem: Coping: Goal: Level of anxiety will decrease Outcome: Progressing

## 2023-05-22 NOTE — Discharge Summary (Addendum)
Physician Discharge Summary   Patient: [name] MRN: [id number] DOB: [birth year]  Admit date:     05/14/2023  Discharge date: 05/22/23  Discharge Physician: Brendia Sacks   PCP: Annita Brod, MD   Recommendations at discharge:    New diagnosis right-sided systolic heart failure, acute on chronic; acute on chronic diastolic CHF Pulmonary hypertension etiology unknown but thought group 2 likely secondary to chronic COPD. DC on PO lasix 20 mg as needed Follow-up with cardiology June 11.  Daily weights.  Low-salt diet.   Dysphagia, unspecified (R13.10)  Diet recommendations: Dysphagia 3 (mechanical soft);Thin liquid (per pt wishes).  Low-salt diet.  Ureteritis Staghorn calculus Follow-up with urology as an outpatient.  Renal function preserved.   Abdominal wall hernia Follow-up with general surgery as an outpatient   Severe compression fracture at T7 Chronicity unclear.  Supportive care, pain control.   Scapholunate Ligament Tear, Hyperextension of MCP Joint of thumb and generative arthropathy and possible subluxation of First carpometacarpal articulation Consider outpatient follow-up outpatient    Pressure injury stage 1 sacrum present on admission   Discharge Diagnoses: Principal Problem:   Acute on chronic respiratory failure with hypoxia (HCC) Active Problems:   Normocytic anemia   Hypertension   COPD (chronic obstructive pulmonary disease) (HCC)   Depression   CAP (community acquired pneumonia)   Constipation   Ureteritis   Staghorn calculus Acute on chronic respiratory failure with hypoxia (HCC) on 2 to 3 L at home Streptococcus pneumoniae pneumonia COPD (chronic obstructive pulmonary disease) (HCC) New diagnosis right-sided systolic heart failure, acute on chronic; acute on chronic diastolic CHF Pulmonary hypertension etiology unknown but thought group 2 likely secondary to chronic COPD. Dysphagia, unspecified (R13.10)  Acute metabolic  encephalopathy Ureteritis Staghorn calculus Normocytic anemia Abdominal wall hernia Severe compression fracture at T7 Scapholunate Ligament Tear, Hyperextension of MCP Joint of thumb and generative arthropathy and possible subluxation of First carpometacarpal articulation Pressure injury stage 1 sacrum present on admission  Resolved Problems:   * No resolved hospital problems. [facility] Course: 87yow presented with constipation and left lower quadrant pain.  Admitted for acute on chronic respiratory failure with hypoxia, pneumonia.  Treated with antibiotics with gradual clinical improvement.  Also developed acute heart failure, further investigation revealed a new diagnosis of right-sided systolic heart failure and pulmonary hypertension.  Seen by cardiology with recommendation for medical management and outpatient follow-up.  Seen by therapy with recommendation for SNF.  Individual details as below.  Acute on chronic respiratory failure with hypoxia (HCC) on 2 to 3 L at home Streptococcus pneumoniae pneumonia COPD (chronic obstructive pulmonary disease) (HCC) Oxygen requirement stable.  COPD appears to be stable.  Completed antibiotics   New diagnosis right-sided systolic heart failure, acute on chronic; acute on chronic diastolic CHF Pulmonary hypertension etiology unknown but thought group 2 likely secondary to chronic COPD. Based on 2D echocardiogram.  CT negative for PE. Seen by cardiology.  Medical management recommended.  Stopped scheduled Lasix.  Creatinine stable. DC on PO lasix 20 mg as needed Follow-up with cardiology June 11.  Daily weights.  Low-salt diet.   Dysphagia, unspecified (R13.10)  Diet recommendations: Dysphagia 3 (mechanical soft);Thin liquid (per pt wishes).  Low-salt diet.   Acute metabolic encephalopathy Resolved. Workup unrevealing.   Ureteritis Staghorn calculus No urine culture was sent Follow-up with urology as an outpatient.  Renal function  preserved.   Constipation Last bowel movement 5/23.  Continue bowel regimen.   Normocytic anemia Monitor hematocrit and hemoglobin.  Hemoglobin  stable.   Abdominal wall hernia Follow-up with general surgery as an outpatient   Severe compression fracture at T7 Chronicity unclear.  Supportive care, pain control.   Chart records concern for melena.  The genesis for this concern appears to be an RN note from 5/20, which noted a dark stool but no visible blood.  Hemoglobin is stable. No fecal occult blood was ordered.  No further evaluation suggested.   Scapholunate Ligament Tear, Hyperextension of MCP Joint of thumb and generative arthropathy and possible subluxation of First carpometacarpal articulation Follow-up outpatient    Goals of care.  Full code   Pressure injury stage 1 sacrum present on admission (at the time of the admission order)        Pain control - [facility] Controlled Substance Reporting System database was reviewed Consultants:  None  Procedures performed:  None   Disposition: Skilled nursing facility Diet recommendation:  Diet Orders (From admission, onward)     Start     Ordered   05/20/23 1830  DIET DYS 3 Fluid consistency: Thin  Diet effective now       Question:  Fluid consistency:  Answer:  Thin   05/20/23 1829            DISCHARGE MEDICATION: Allergies as of 05/22/2023       Reactions   Ambien [zolpidem] Other (See Comments)   Hallucinations and paranoid   Codeine Hives, Nausea And Vomiting        Medication List     TAKE these medications    acetaminophen 325 MG tablet Commonly known as: TYLENOL Take 2 tablets (650 mg total) by mouth every 6 (six) hours as needed for mild pain (or Fever >/= 101).   albuterol 108 (90 Base) MCG/ACT inhaler Commonly known as: VENTOLIN HFA Inhale 2 puffs into the lungs every 4 (four) hours as needed for wheezing, cough or shortness of breath.   aspirin EC 81 MG tablet Take 81 mg by mouth  daily. Swallow whole.   DULoxetine 60 MG capsule Commonly known as: CYMBALTA Take 60 mg by mouth daily.   ferrous sulfate 325 (65 FE) MG EC tablet Take 325 mg by mouth daily.   furosemide 20 MG tablet Commonly known as: Lasix Take 1 tablet (20 mg total) by mouth every other day as needed for edema or fluid.   lidocaine 5 % Commonly known as: LIDODERM Place 1 patch onto the skin daily. Remove & Discard patch within 12 hours or as directed by MD Start taking on: May 23, 2023   oxyCODONE 5 MG immediate release tablet Commonly known as: Oxy IR/ROXICODONE Take 0.5 tablets (2.5 mg total) by mouth every 6 (six) hours as needed for severe pain or moderate pain.   polyethylene glycol 17 g packet Commonly known as: MIRALAX / GLYCOLAX Take 17 g by mouth 3 (three) times daily as needed for mild constipation or moderate constipation.   senna-docusate 8.6-50 MG tablet Commonly known as: Senokot-S Take 1 tablet by mouth 2 (two) times daily.   thiamine 100 MG tablet Commonly known as: Vitamin B-1 Take 1 tablet (100 mg total) by mouth daily. Start taking on: May 23, 2023   vitamin D3 25 MCG tablet Commonly known as: CHOLECALCIFEROL Take 1 tablet (1,000 Units total) by mouth daily. Start taking on: May 23, 2023               Discharge Care Instructions  (From admission, onward)  Start     Ordered   05/22/23 0000  Discharge wound care:       Comments: Monitor sacrum stage I, foam dressing   05/22/23 0954            Contact information for follow-up providers     Marjie Skiff E, PA-C Follow up.   Specialty: Cardiology Why: [facility] location - cardiology follow-up arranged on Tuesday June 08, 2023 at 10:55 AM (Arrive by 10:40 AM). Contact information: [address] [place] [address] [phone]         [facility]. Schedule an appointment as soon as possible for a visit in 2 week(s).   Contact  information: [address] [place] [address] [phone]        [facility] [place]. Schedule an appointment as soon as possible for a visit in 4 week(s).   Specialty: General Surgery Contact information: [address] [place] [address] [phone]             Contact information for after-discharge care     Destination     [facility] Preferred SNF .   Service: Skilled Nursing Contact information: [address] [place] [address] [phone]                    Discharge Exam: Filed Weights   05/17/23 0544 05/21/23 1000 05/22/23 0500  Weight: 44.5 kg 35.1 kg 34.9 kg   Physical Exam Vitals reviewed.  Constitutional:      General: She is not in acute distress.    Appearance: She is not ill-appearing or toxic-appearing.  Cardiovascular:     Rate and Rhythm: Normal rate and regular rhythm.     Heart sounds: No murmur heard. Neurological:     Mental Status: She is alert.  Psychiatric:        Mood and Affect: Mood normal.        Behavior: Behavior normal.      Condition at discharge: good  The results of significant diagnostics from this hospitalization (including imaging, microbiology, ancillary and laboratory) are listed below for reference.   Imaging Studies: CT Angio Chest Pulmonary Embolism (PE) W or WO Contrast  Result Date: 05/18/2023 CLINICAL DATA:  Acute respiratory failure with hypoxia. Abnormal echocardiography (progressive right ventricular dilatation and dysfunction with biatrial enlargement). EXAM: CT ANGIOGRAPHY CHEST WITH CONTRAST TECHNIQUE: Multidetector CT imaging of the chest was performed using the standard protocol during bolus administration of intravenous contrast. Multiplanar CT image reconstructions and MIPs were obtained to evaluate the vascular anatomy. RADIATION DOSE REDUCTION: This exam was performed according to the  departmental dose-optimization program which includes automated exposure control, adjustment of the mA and/or kV according to patient size and/or use of iterative reconstruction technique. CONTRAST:  75mL OMNIPAQUE IOHEXOL 350 MG/ML SOLN COMPARISON:  Radiographs 05/17/2023 and 05/14/2023. Chest CTA 05/23/2007. Abdominopelvic CT 05/14/2023. FINDINGS: Cardiovascular: The pulmonary arteries are well opacified with contrast to the level of the segmental branches. There is no evidence of acute pulmonary embolism. Mild central enlargement of the pulmonary arteries. There is diffuse atherosclerosis of the aorta, great vessels and coronary arteries. The aorta is suboptimally opacified, but no acute systemic arterial abnormalities are identified. There are calcifications of the aortic valve. Mild cardiomegaly with predominately right-sided chamber enlargement. No significant pericardial effusion. Mediastinum/Nodes: There are no enlarged mediastinal, hilar or axillary lymph nodes. The thyroid gland, trachea and esophagus  demonstrate no significant findings. Lungs/Pleura: Small right-greater-than-left pleural effusions with dependent consolidation in both lower lobes, likely due to atelectasis, although aspiration not excluded. These pulmonary opacities have increased compared with the recent abdominal CT. Underlying chronic biapical scarring, mild central airway thickening and scattered subpleural reticulation. No suspicious pulmonary nodules are identified. Upper abdomen: No definite acute findings are seen within the visualized upper abdomen. There is prominent reflux of contrast into IVC, hepatic veins and right renal vein. A large left-sided staghorn calculus is partially imaged, grossly unchanged. There is a smaller nonobstructing right renal calculus. Musculoskeletal/Chest wall: Interval development of a severe compression fracture at T7 which also appears new compared with chest radiographs 08/18/2018). There are  chronic appearing compression deformities at T10, T11 and L1, status post spinal augmentation at the T11 and L1 levels. Review of the MIP images confirms the above findings. IMPRESSION: 1. No evidence of acute pulmonary embolism. Central enlargement of the pulmonary arteries consistent with pulmonary arterial hypertension. 2. Cardiomegaly with predominately right-sided chamber enlargement and prominent reflux of contrast into the IVC, hepatic veins and right renal vein consistent with elevated right heart pressures. 3. Small right-greater-than-left pleural effusions with increasing dependent consolidation in both lower lobes, likely due to atelectasis, although aspiration not excluded. 4. Interval development of a severe compression fracture at T7 which appears new compared with chest radiographs 08/18/2018. Chronic appearing compression deformities at T10, T11 and L1, status post spinal augmentation at T11 and L1. 5.  Aortic Atherosclerosis (ICD10-I70.0). Electronically Signed   By: Carey Bullocks M.D.   On: 05/18/2023 13:15   ECHOCARDIOGRAM COMPLETE  Result Date: 05/18/2023    ECHOCARDIOGRAM REPORT   Patient Name:   BRAILYNN MILOTA Date of Exam: 05/18/2023 Medical Rec #:  [id number]     Height:       60.0 in Accession #:    [id number]    Weight:       98.0 lb Date of Birth:  April 16, 1936    BSA:          1.378 m Patient Age:    86 years      BP:           142/73 mmHg Patient Gender: F             HR:           78 bpm. Exam Location:  Inpatient Procedure: 2D Echo, Cardiac Doppler and Color Doppler Indications:    Other abnormalities of the heart R00.8  History:        Patient has prior history of Echocardiogram examinations, most                 recent 05/01/2019. COPD and Stroke, Signs/Symptoms:Shortness of                 Breath; Risk Factors:Hypertension and Former Smoker.  Sonographer:    Darlys Gales Referring Phys: [phone] A CALDWELL POWELL JR IMPRESSIONS  1. Left ventricular ejection fraction, by estimation, is  65 to 70%. The left ventricle has normal function. The left ventricle has no regional wall motion abnormalities. Left ventricular diastolic parameters are consistent with Grade I diastolic dysfunction (impaired relaxation).  2. Right ventricular systolic function is moderately reduced. The right ventricular size is severely enlarged. There is mildly elevated pulmonary artery systolic pressure.  3. Left atrial size was moderately dilated.  4. Right atrial size was severely dilated.  5. The mitral valve is normal in structure. No evidence of mitral valve regurgitation.  No evidence of mitral stenosis.  6. The aortic valve is tricuspid. There is moderate calcification of the aortic valve. There is moderate thickening of the aortic valve. Aortic valve regurgitation is not visualized. Aortic valve sclerosis/calcification is present, without any evidence of aortic stenosis.  7. Midsystolic notching of the pulmonary outflow tract velocity profile suggests severely elevated pulmonary vascular resistance.  8. The inferior vena cava is dilated in size with <50% respiratory variability, suggesting right atrial pressure of 15 mmHg. Comparison(s): Prior images reviewed side by side. Changes from prior study are noted. The right ventricular systolic function is significantly worse. The right ventricle is now severely enlarged, as is the right atrium. FINDINGS  Left Ventricle: Left ventricular ejection fraction, by estimation, is 65 to 70%. The left ventricle has normal function. The left ventricle has no regional wall motion abnormalities. The left ventricular internal cavity size was normal in size. There is  no left ventricular hypertrophy. Left ventricular diastolic parameters are consistent with Grade I diastolic dysfunction (impaired relaxation). Normal left ventricular filling pressure. Right Ventricle: The right ventricular size is severely enlarged. Right vetricular wall thickness was not well visualized. Right ventricular  systolic function is moderately reduced. There is mildly elevated pulmonary artery systolic pressure. The tricuspid regurgitant velocity is 2.57 m/s, and with an assumed right atrial pressure of 15 mmHg, the estimated right ventricular systolic pressure is 41.4 mmHg. Left Atrium: Left atrial size was moderately dilated. Right Atrium: Right atrial size was severely dilated. Pericardium: There is no evidence of pericardial effusion. Mitral Valve: The mitral valve is normal in structure. Mild to moderate mitral annular calcification. No evidence of mitral valve regurgitation. No evidence of mitral valve stenosis. Tricuspid Valve: The tricuspid valve is normal in structure. Tricuspid valve regurgitation is trivial. Aortic Valve: The aortic valve is tricuspid. There is moderate calcification of the aortic valve. There is moderate thickening of the aortic valve. Aortic valve regurgitation is not visualized. Aortic valve sclerosis/calcification is present, without any  evidence of aortic stenosis. Aortic valve mean gradient measures 4.0 mmHg. Aortic valve peak gradient measures 7.4 mmHg. Aortic valve area, by VTI measures 1.57 cm. Pulmonic Valve: Midsystolic notching of the pulmonary outflow tract velocity profile suggests severely elevated pulmonary vascular resistance. The pulmonic valve was grossly normal. Pulmonic valve regurgitation is trivial. Aorta: The aortic root is normal in size and structure. Venous: The inferior vena cava is dilated in size with less than 50% respiratory variability, suggesting right atrial pressure of 15 mmHg. IAS/Shunts: No atrial level shunt detected by color flow Doppler.  LEFT VENTRICLE PLAX 2D LVIDd:         3.40 cm   Diastology LVIDs:         1.70 cm   LV e' medial:    5.33 cm/s LV PW:         1.10 cm   LV E/e' medial:  7.5 LV IVS:        0.80 cm   LV e' lateral:   7.51 cm/s LVOT diam:     1.94 cm   LV E/e' lateral: 5.3 LV SV:         47 LV SV Index:   34 LVOT Area:     2.96 cm  RIGHT  VENTRICLE RV S prime:     10.00 cm/s TAPSE (M-mode): 1.2 cm LEFT ATRIUM             Index        RIGHT ATRIUM  Index LA Vol (A2C):   50.8 ml 36.85 ml/m  RA Area:     24.60 cm LA Vol (A4C):   58.6 ml 42.51 ml/m  RA Volume:   83.00 ml  60.21 ml/m LA Biplane Vol: 54.8 ml 39.76 ml/m  AORTIC VALVE AV Area (Vmax):    1.87 cm AV Area (Vmean):   1.59 cm AV Area (VTI):     1.57 cm AV Vmax:           136.00 cm/s AV Vmean:          98.300 cm/s AV VTI:            0.300 m AV Peak Grad:      7.4 mmHg AV Mean Grad:      4.0 mmHg LVOT Vmax:         86.20 cm/s LVOT Vmean:        52.900 cm/s LVOT VTI:          0.159 m LVOT/AV VTI ratio: 0.53 MITRAL VALVE               TRICUSPID VALVE MV Area (PHT): 3.17 cm    TR Peak grad:   26.4 mmHg MV Decel Time: 239 msec    TR Vmax:        257.00 cm/s MV E velocity: 39.80 cm/s MV A velocity: 90.00 cm/s  SHUNTS MV E/A ratio:  0.44        Systemic VTI:  0.16 m                            Systemic Diam: 1.94 cm Rachelle Hora Croitoru MD Electronically signed by Thurmon Fair MD Signature Date/Time: 05/18/2023/9:59:53 AM    Final    DG CHEST PORT 1 VIEW  Result Date: 05/17/2023 CLINICAL DATA:  Shortness of breath EXAM: PORTABLE CHEST 1 VIEW COMPARISON:  CXR 05/14/23 FINDINGS: Small bilateral pleural effusions, right-greater-than-left, new from prior exam. New bibasilar airspace opacities could represent atelectasis or infection. Unchanged cardiac and mediastinal contours. No radiographically apparent displaced rib fractures. Visualized upper abdomen is unremarkable. Kyphoplasty changes in the lower thoracic and upper lumbar spine. IMPRESSION: 1. Small bilateral pleural effusions, right-greater-than-left, new from prior exam. 2. New bibasilar airspace opacities could represent atelectasis or infection. Electronically Signed   By: Lorenza Cambridge M.D.   On: 05/17/2023 10:27   DG Chest Portable 1 View  Result Date: 05/14/2023 CLINICAL DATA:  Cough EXAM: PORTABLE CHEST 1 VIEW COMPARISON:   CXR 05/02/20 FINDINGS: No pleural effusion. No pneumothorax. Trace left pleural effusion, decreased from prior exam. Kyphoplasty changes in the mid and lower thoracic spine. Contrast opacify the renal collecting system on the right. Bibasilar hazy airspace opacities are nonspecific and could represent atelectasis or infection. No radiographically apparent displaced rib fractures. Visualized upper abdomen is unremarkable. IMPRESSION: Hazy bibasilar airspace opacities could represent atelectasis or infection. Electronically Signed   By: Lorenza Cambridge M.D.   On: 05/14/2023 10:48   CT ABDOMEN PELVIS W CONTRAST  Result Date: 05/14/2023 CLINICAL DATA:  Left lower quadrant abdominal pain and constipation. EXAM: CT ABDOMEN AND PELVIS WITH CONTRAST TECHNIQUE: Multidetector CT imaging of the abdomen and pelvis was performed using the standard protocol following bolus administration of intravenous contrast. RADIATION DOSE REDUCTION: This exam was performed according to the departmental dose-optimization program which includes automated exposure control, adjustment of the mA and/or kV according to patient size and/or use of iterative reconstruction technique. CONTRAST:  75mL  OMNIPAQUE IOHEXOL 300 MG/ML  SOLN COMPARISON:  02/14/2022 FINDINGS: Lower chest: Mild cardiomegaly. Aortic and coronary atherosclerosis. Mitral and aortic valve calcifications. Dependent atelectasis in both lower lobes. Hepatobiliary: Mild intrahepatic biliary prominence with the common hepatic duct at 1.1 cm in the common bile duct at 0.9 cm, mildly dilated. Previously these measured 0.8 cm and 0.6 cm, respectively. No directly visualized choledocholithiasis. No significant focal parenchymal lesions in the liver observed. Pancreas: Unremarkable Spleen: Unremarkable Adrenals/Urinary Tract: Both adrenal glands appear normal. There continues to be an unusually large staghorn calculus casting throughout most of the left renal collecting systems, infundibula,  and calices, and measuring proximally 3.7 by 2.5 by 5.9 cm. Despite the presence of this large stone, there does appear to be some excretion of contrast in the left kidney with little in the way of delayed nephrogram compared to the right. There is some mild enhancement along the wall of the left collecting system probably from chronic irritation, as well as left proximal hydroureter extending to the level of the iliac vessel cross over, and mild abnormal enhancement of the wall of the left proximal ureter suggesting ureteritis. The left ureter distal to the iliac vessel cross over is relatively unremarkable. Two nonobstructive right kidney lower pole calculi are present, the larger measuring 0.5 cm in long axis. Urinary bladder unremarkable aside from a suspected left bladder diverticulum. Stomach/Bowel: Just above the level of the inguinal ring on the left, there is a left lateral abdominal wall hernia containing a small loop of proximal sigmoid colon as shown on image 54 series 2 and image 32 series 7. No findings of strangulation or resulting obstruction. Mild sigmoid colon diverticulosis is present. Scattered air-fluid levels are present in nondilated loops of small bowel. No compelling findings of small-bowel obstruction. Vascular/Lymphatic: Atherosclerosis is present, including aortoiliac atherosclerotic disease. Nonocclusive atheromatous plaque proximally in the superior mesenteric artery. There is also some dorsal nonocclusive plaque at the origin of the celiac trunk. Reproductive: Uterus absent.  Adnexa unremarkable. Other: No supplemental non-categorized findings. Musculoskeletal: In addition to the left lateral lower anterior abdominal wall hernia just above the inguinal ring, we demonstrate two fat containing supraumbilical hernias as shown on image 78 series 9. Prior sacroplasty and prior vertebral augmentations at the T11, L1, and L3 levels without substantial change. There continues to be about 6 mm  of posterior retropulsion at the L1 fracture site contributing to central narrowing of the thecal sac, and grade 1 retrolisthesis of L3 on L4. Moderate degenerative arthropathy of both hips. The patient's left hand was incidentally included in the imaging region, demonstrating severe hyper extension at the MCP joint of the left thumb, degenerative arthropathy and possible subluxation at the first carpometacarpal joint, and suspected widening of the scapholunate interval suggesting scapholunate ligament tear. IMPRESSION: 1. Left lateral abdominal wall hernia just above the level of the inguinal ring containing a small loop of proximal sigmoid colon, without findings of strangulation or resulting obstruction. 2. There is an unusually large staghorn calculus casting throughout most of the left renal collecting systems, infundibula, and calices, and measuring up to 5.9 cm in long axis. There is some excretion of contrast in the left kidney with little in the way of delayed nephrogram compared to the right. There is also left proximal hydroureter extending to the level of the iliac vessel cross over, with mild abnormal enhancement of the wall of the left collecting system and left proximal ureter suggesting ureteritis. 3. Two nonobstructive right kidney lower pole calculi. 4.  Scattered air-fluid levels in nondilated loops of small bowel, query mild ileus. 5. Mild cardiomegaly with aortic and coronary atherosclerosis. Mitral and aortic valve calcifications. 6. Mild intrahepatic and extrahepatic biliary dilatation, increased from prior. No directly visualized choledocholithiasis. 7. Prior sacroplasty and prior vertebral augmentations at T11, L1, and L3. 8. Moderate degenerative arthropathy of both hips. 9. Two fat containing supraumbilical hernias. 10. Incidental inclusion of the left hand suggests probable scapholunate ligament tear, hyperextension of the MCP joint of the thumb, and degenerative arthropathy and possible  subluxation at the first carpometacarpal articulation. Aortic Atherosclerosis (ICD10-I70.0). Electronically Signed   By: Gaylyn Rong M.D.   On: 05/14/2023 09:03    Microbiology: Results for orders placed or performed during the hospital encounter of 05/14/23  Blood culture (routine x 2)     Status: None   Collection Time: 05/14/23  2:55 PM   Specimen: BLOOD  Result Value Ref Range Status   Specimen Description   Final    BLOOD SITE NOT SPECIFIED Performed at [facility], [address], [place], [place] [address]    Special Requests   Final    BOTTLES DRAWN AEROBIC AND ANAEROBIC Blood Culture adequate volume Performed at [facility], [address] [place], [place] [address]    Culture   Final    NO GROWTH 5 DAYS Performed at [facility], [address] [place], [place] [address]    Report Status 05/19/2023 FINAL  Final  Blood culture (routine x 2)     Status: None   Collection Time: 05/14/23  3:05 PM   Specimen: BLOOD  Result Value Ref Range Status   Specimen Description   Final    BLOOD SITE NOT SPECIFIED Performed at [facility], [address] [place], [place] [address]    Special Requests   Final    BOTTLES DRAWN AEROBIC ONLY Blood Culture results may not be optimal due to an inadequate volume of blood received in culture bottles Performed at [facility] [address], [place], [place] [address]    Culture   Final    NO GROWTH 5 DAYS Performed at [facility], [address] [place], [place] [address]    Report Status 05/19/2023 FINAL  Final    Labs: CBC: Recent Labs  Lab 05/16/23 0401 05/17/23 0417 05/17/23 0902 05/17/23 1526 05/18/23 0435 05/19/23 0835  WBC 9.0 7.9  --   --  7.1 10.7*  NEUTROABS 7.6 6.4  --   --  5.9  --   HGB 9.4* 9.3* 9.3* 9.2* 9.2* 11.2*  HCT 32.1* 31.1* 32.1* 32.3* 31.2* 37.5  MCV 96.4 96.6  --   --  96.0 93.1  PLT 287 245  --    --  253 378   Basic Metabolic Panel: Recent Labs  Lab 05/16/23 0401 05/17/23 0417 05/18/23 0435 05/19/23 0835 05/20/23 0427 05/21/23 1013  NA 140 141 141 139 137 135  K 4.2 3.9 3.2* 3.2* 3.4* 4.0  CL 104 106 94* 82* 82* 88*  CO2 28 29 36* 44* 42* 35*  GLUCOSE 195* 154* 102* 104* 118* 134*  BUN 18 15 10 11 21 20   CREATININE 0.70 0.80 0.66 0.68 0.86 0.73  CALCIUM 8.1* 8.0* 7.7* 8.0* 8.1* 8.2*  MG 2.7* 2.4 1.8  --   --   --   PHOS 3.0 2.8 3.0  --   --   --    Liver Function Tests:  Recent Labs  Lab 05/16/23 0401 05/17/23 0417 05/18/23 0435  AST 14* 49* 20  ALT 9 28 20   ALKPHOS 54 51 44  BILITOT 0.5 0.3 0.6  PROT 6.2* 6.2* 6.3*  ALBUMIN 2.5* 2.5* 2.9*   CBG: No results for input(s): "GLUCAP" in the last 168 hours.  Discharge time spent: less than 30 minutes.  Signed: Brendia Sacks, MD [facility] 05/22/2023

## 2023-05-22 NOTE — Progress Notes (Signed)
Called and spoke to patient's son [name] while in the room with the patient regarding discharge to [facility]. Sone Radiographer, therapeutic has already given him the address and phone number. Confirmed only patient belongings are a gown, robe, and glasses. All additional questions/concerns addressed. Will call son to notify when EMS has arrived to transport patient

## 2023-05-22 NOTE — Progress Notes (Signed)
Attempted to call report - phone went to voicemail. Will re-attempt at a later time

## 2023-06-05 NOTE — Progress Notes (Unsigned)
Cardiology Office Note:    Date:  06/05/2023   ID:  [name], DOB [birth year], MRN [id number]  PCP:  Annita Brod, MD  Cardiologist:  Thomasene Ripple, DO  Electrophysiologist:  None   Referring MD: Annita Brod, MD   Chief Complaint: hospital follow-up of right sided CHF  History of Present Illness:    [name] is a 87 y.o. female with a history of chronic HFpEF with RV failure, COPD on 2-3L of O2 at home, multiple admissions for pneumonia,  hypertension, osteoarthritis, lumbar vertebrae and sacral fracture, and chronic back pain who is followed by Dr. Servando Salina and presents today for hospital follow-up of right sided heart failure.   Patient was first seen by Cardiology during recent hospitalization. She was admitted from 05/14/2023 to 05/22/2023 for community acquired pneumonia with acute respiratory failure althouh she actually presented with left lower quadrant pain and constipation. Work-up also showed new right sided heart failure, small bilateral pleural effusion, abdominal wall hernia, large staghorn calculus in the left renal collecting system with possible ureteritis, and possible mild ileus. Echo sowed LVEF of 65-70% with grade 1 diastolic dysfunction, severely enlarged RV with moderately reduced RV function, biatrial enlargement (right > left), mildly elevated PASP but midsystolic notching of the pulmonary outflow tract velocity profile suggesting severely elevated pulmonary vascular resistance. Hospitalization complicated by acute metabolic encephalopathy, malnourishment, failure to thrive, and episodic refusal of medications. He was diuresed with IV Lasix and then discharged on PRN Lasix. Etiology of RV failure unclear. Chest CTA was negative for PE. However, suspected to be due to COPD with chronic respiratory failure. Given other comorbidities including frailty and failure to thrive, RHC was not felt to be indicated. Palliative care was consulted and patient/ family wanted to  proceed with full scope of care.  Patient presents today for hospital follow-up. ***  Chronic HFpEF Right Sided CHF Patient was recently admitted for community acquired pneumonia and was found to have right sided heart failure as well. Echo showed LVEF of 65-70% with grade 1 diastolic dysfunction, severely enlarged RV with moderately reduced RV function, biatrial enlargement (right > left), mildly elevated PASP but midsystolic notching of the pulmonary outflow tract velocity profile suggesting severely elevated pulmonary vascular resistance. Etiology unclear. Chest CTA was negative for PE. Suspected to likely be due to COPD with chronic respiratory failure requiring O2 at home (she has also had multiple admission for pneumonia).  He was diuresed with IV Lasix and then discharged on PRN Lasix. Hospitalization was complicated by acute metabolic encephalopathy and failure to thrive so invasive work-up was not felt to be indicated. - Euvolemic on exam.  - Continue Lasix 20mg  daily as needed for weight gain and edema.  - ***  Hypertension History of hypertension but not on any antihypertensives.  - BP well controlled. ***  COPD  On 2-3 L of O2 at home.  - Management per PCP and Pulmonology. ***  Past Medical History:  Diagnosis Date   Arthritis    CAP (community acquired pneumonia)    multiple admissions   Chronic back pain 08/22/2018   Colitis 03/19/2014   Compression fx, lumbar spine (HCC)    COPD (chronic obstructive pulmonary disease) (HCC)    Delirium    Hypertension    On home oxygen therapy 06-14-13   continuos 2.5l/m nasally-24/7   Osteoporosis    Sacral fracture (HCC) 2014    Past Surgical History:  Procedure Laterality Date   ABDOMINAL HYSTERECTOMY  BIOPSY  05/24/2019   Procedure: BIOPSY;  Surgeon: Bernette Redbird, MD;  Location: [facility] ENDOSCOPY;  Service: Endoscopy;;   COLONOSCOPY WITH PROPOFOL Left 03/20/2020   Procedure: COLONOSCOPY WITH PROPOFOL;  Surgeon: Charlott Rakes, MD;  Location: [facility] ENDOSCOPY;  Service: Gastroenterology;  Laterality: Left;  needs ultraslim colonoscope   ESOPHAGOGASTRODUODENOSCOPY N/A 06/15/2013   Procedure: ESOPHAGOGASTRODUODENOSCOPY (EGD);  Surgeon: Shirley Friar, MD;  Location: [facility] ENDOSCOPY;  Service: Endoscopy;  Laterality: N/A;   ESOPHAGOGASTRODUODENOSCOPY (EGD) WITH PROPOFOL N/A 05/24/2019   Procedure: ESOPHAGOGASTRODUODENOSCOPY (EGD) WITH PROPOFOL;  Surgeon: Bernette Redbird, MD;  Location: [facility] ENDOSCOPY;  Service: Endoscopy;  Laterality: N/A;   IR GENERIC HISTORICAL  10/15/2016   IR KYPHO LUMBAR INC FX REDUCE BONE BX UNI/BIL CANNULATION INC/IMAGING 10/15/2016 Julieanne Cotton, MD [facility]INTER[facility] RAD   POLYPECTOMY  03/20/2020   Procedure: POLYPECTOMY;  Surgeon: Charlott Rakes, MD;  Location: [facility] ENDOSCOPY;  Service: Gastroenterology;;   sacroplasty  06-14-13   05-10-13 IVR CONE for fracture stabilization    Current Medications: No outpatient medications have been marked as taking for the 06/08/23 encounter (Appointment) with Corrin Parker, PA-C.     Allergies:   Ambien [zolpidem] and Codeine   Social History   Socioeconomic History   Marital status: Divorced    Spouse name: Not on file   Number of children: Not on file   Years of education: Not on file   Highest education level: Not on file  Occupational History   Not on file  Tobacco Use   Smoking status: Former    Types: Cigarettes    Quit date: 06/15/2011    Years since quitting: 11.9   Smokeless tobacco: Never  Substance and Sexual Activity   Alcohol use: No   Drug use: No   Sexual activity: Never  Other Topics Concern   Not on file  Social History Narrative   Not on file   Social Determinants of Health   Financial Resource Strain: Not on file  Food Insecurity: No Food Insecurity (05/14/2023)   Hunger Vital Sign    Worried About Running Out of Food in the Last Year: Never true    Ran Out of Food in the Last Year: Never true  Transportation  Needs: No Transportation Needs (05/14/2023)   PRAPARE - Administrator, Civil Service (Medical): No    Lack of Transportation (Non-Medical): No  Physical Activity: Not on file  Stress: Not on file  Social Connections: Not on file     Family History: The patient's family history includes Diabetes Mellitus II in her sister; Hypertension in her sister.  ROS:   Please see the history of present illness.     EKGs/Labs/Other Studies Reviewed:    The following studies were reviewed:  Echocardiogram 05/18/2023: Impressions: 1. Left ventricular ejection fraction, by estimation, is 65 to 70%. The  left ventricle has normal function. The left ventricle has no regional  wall motion abnormalities. Left ventricular diastolic parameters are  consistent with Grade I diastolic  dysfunction (impaired relaxation).   2. Right ventricular systolic function is moderately reduced. The right  ventricular size is severely enlarged. There is mildly elevated pulmonary  artery systolic pressure.   3. Left atrial size was moderately dilated.   4. Right atrial size was severely dilated.   5. The mitral valve is normal in structure. No evidence of mitral valve  regurgitation. No evidence of mitral stenosis.   6. The aortic valve is tricuspid. There is moderate calcification of the  aortic valve. There is moderate thickening of the aortic valve. Aortic  valve regurgitation is not visualized. Aortic valve  sclerosis/calcification is present, without any evidence  of aortic stenosis.   7. Midsystolic notching of the pulmonary outflow tract velocity profile  suggests severely elevated pulmonary vascular resistance.   8. The inferior vena cava is dilated in size with <50% respiratory  variability, suggesting right atrial pressure of 15 mmHg.    EKG:  EKG not ordered today.   Recent Labs: 05/16/2023: TSH 1.952 05/17/2023: B Natriuretic Peptide 1,307.6 05/18/2023: ALT 20; Magnesium 1.8 05/19/2023:  Hemoglobin 11.2; Platelets 378 05/21/2023: BUN 20; Creatinine, Ser 0.73; Potassium 4.0; Sodium 135  Recent Lipid Panel    Component Value Date/Time   CHOL 170 05/01/2019 0518   TRIG 96 05/01/2019 0518   HDL 45 05/01/2019 0518   CHOLHDL 3.8 05/01/2019 0518   VLDL 19 05/01/2019 0518   LDLCALC 106 (H) 05/01/2019 0518    Physical Exam:    Vital Signs: There were no vitals taken for this visit.    Wt Readings from Last 3 Encounters:  05/22/23 76 lb 15.1 oz (34.9 kg)  02/14/22 83 lb 12.4 oz (38 kg)  09/09/21 91 lb 3.2 oz (41.4 kg)     General: 87 y.o. female in no acute distress. HEENT: Normocephalic and atraumatic. Sclera clear. EOMs intact. Neck: Supple. No carotid bruits. No JVD. Heart: *** RRR. Distinct S1 and S2. No murmurs, gallops, or rubs. Radial and distal pedal pulses 2+ and equal bilaterally. Lungs: No increased work of breathing. Clear to ausculation bilaterally. No wheezes, rhonchi, or rales.  Abdomen: Soft, non-distended, and non-tender to palpation. Bowel sounds present in all 4 quadrants.  MSK: Normal strength and tone for age. *** Extremities: No lower extremity edema.    Skin: Warm and dry. Neuro: Alert and oriented x3. No focal deficits. Psych: Normal affect. Responds appropriately.   Assessment:    No diagnosis found.  Plan:     Disposition: Follow up in ***   Medication Adjustments/Labs and Tests Ordered: Current medicines are reviewed at length with the patient today.  Concerns regarding medicines are outlined above.  No orders of the defined types were placed in this encounter.  No orders of the defined types were placed in this encounter.   There are no Patient Instructions on file for this visit.   Signed, Corrin Parker, PA-C  06/05/2023 6:48 PM    [facility]

## 2023-06-08 ENCOUNTER — Ambulatory Visit: Payer: Medicare Other | Attending: Student | Admitting: Student

## 2023-06-08 ENCOUNTER — Encounter: Payer: Self-pay | Admitting: Student

## 2023-06-08 VITALS — BP 130/66 | HR 99 | Ht 60.0 in | Wt 80.0 lb

## 2023-06-08 DIAGNOSIS — I50812 Chronic right heart failure: Secondary | ICD-10-CM | POA: Diagnosis not present

## 2023-06-08 DIAGNOSIS — I5032 Chronic diastolic (congestive) heart failure: Secondary | ICD-10-CM

## 2023-06-08 DIAGNOSIS — J449 Chronic obstructive pulmonary disease, unspecified: Secondary | ICD-10-CM

## 2023-06-08 DIAGNOSIS — I10 Essential (primary) hypertension: Secondary | ICD-10-CM

## 2023-06-08 NOTE — Patient Instructions (Signed)
Medication Instructions:  Lasix 20 mg ( Take 1 Tablet Daily As Needed For Weight Gain of 3 lbs overnight and 5 lbs. In A Week) *If you need a refill on your cardiac medications before your next appointment, please call your pharmacy*   Lab Work: No Labs If you have labs (blood work) drawn today and your tests are completely normal, you will receive your results only by: MyChart Message (if you have MyChart) OR A paper copy in the mail If you have any lab test that is abnormal or we need to change your treatment, we will call you to review the results.   Testing/Procedures: No Testing   Follow-Up: At [facility], you and your health needs are our priority.  As part of our continuing mission to provide you with exceptional heart care, we have created designated Provider Care Teams.  These Care Teams include your primary Cardiologist (physician) and Advanced Practice Providers (APPs -  Physician Assistants and Nurse Practitioners) who all work together to provide you with the care you need, when you need it.  We recommend signing up for the patient portal called "MyChart".  Sign up information is provided on this After Visit Summary.  MyChart is used to connect with patients for Virtual Visits (Telemedicine).  Patients are able to view lab/test results, encounter notes, upcoming appointments, etc.  Non-urgent messages can be sent to your provider as well.   To learn more about what you can do with MyChart, go to [email or web address].    Your next appointment:   3 month(s)  Provider:   Thomasene Ripple, DO

## 2023-07-16 ENCOUNTER — Telehealth: Payer: Self-pay | Admitting: Cardiology

## 2023-07-16 NOTE — Telephone Encounter (Signed)
Attempted to call the patient, unable to leave voicemail message.

## 2023-07-16 NOTE — Telephone Encounter (Signed)
Pt c/o swelling/edema: STAT if pt has developed SOB within 24 hours  If swelling, where is the swelling located? Feet  How much weight have you gained and in what time span? NA  Have you gained 2 pounds in a day or 5 pounds in a week? No  Do you have a log of your daily weights (if so, list)? No  Are you currently taking a fluid pill? Yes  Are you currently SOB? No  Have you traveled recently in a car or plane for an extended period of time? No

## 2023-07-20 NOTE — Telephone Encounter (Signed)
Call to patient. No VM, unable to LM

## 2023-07-21 NOTE — Telephone Encounter (Signed)
Cal to patient.  Unable to LM, no voicemail

## 2023-07-22 NOTE — Telephone Encounter (Signed)
Multiple attempts to contact patient.  Unable to LM, will wait for her to call if needs appt.

## 2023-09-08 ENCOUNTER — Ambulatory Visit: Payer: Medicare Other | Admitting: Cardiology

## 2024-08-24 ENCOUNTER — Emergency Department (HOSPITAL_COMMUNITY)
Admission: EM | Admit: 2024-08-24 | Discharge: 2024-08-24 | Disposition: A | Discharge status: Home / Self Care | Attending: Emergency Medicine | Admitting: Emergency Medicine

## 2024-08-24 ENCOUNTER — Emergency Department (HOSPITAL_COMMUNITY)

## 2024-08-24 ENCOUNTER — Encounter (HOSPITAL_COMMUNITY): Payer: Self-pay

## 2024-08-24 DIAGNOSIS — Z7982 Long term (current) use of aspirin: Secondary | ICD-10-CM | POA: Insufficient documentation

## 2024-08-24 DIAGNOSIS — N390 Urinary tract infection, site not specified: Secondary | ICD-10-CM

## 2024-08-24 DIAGNOSIS — K5900 Constipation, unspecified: Secondary | ICD-10-CM | POA: Insufficient documentation

## 2024-08-24 DIAGNOSIS — R82998 Other abnormal findings in urine: Secondary | ICD-10-CM | POA: Insufficient documentation

## 2024-08-24 DIAGNOSIS — J449 Chronic obstructive pulmonary disease, unspecified: Secondary | ICD-10-CM | POA: Insufficient documentation

## 2024-08-24 DIAGNOSIS — I10 Essential (primary) hypertension: Secondary | ICD-10-CM | POA: Diagnosis not present

## 2024-08-24 DIAGNOSIS — R197 Diarrhea, unspecified: Secondary | ICD-10-CM | POA: Diagnosis present

## 2024-08-24 DIAGNOSIS — K5641 Fecal impaction: Secondary | ICD-10-CM

## 2024-08-24 DIAGNOSIS — K439 Ventral hernia without obstruction or gangrene: Secondary | ICD-10-CM | POA: Insufficient documentation

## 2024-08-24 LAB — DIFFERENTIAL
Abs Immature Granulocytes: 0.01 K/uL (ref 0.00–0.07)
Basophils Absolute: 0.1 K/uL (ref 0.0–0.1)
Basophils Relative: 1 %
Eosinophils Absolute: 0.2 K/uL (ref 0.0–0.5)
Eosinophils Relative: 4 %
Immature Granulocytes: 0 %
Lymphocytes Relative: 16 %
Lymphs Abs: 1.1 K/uL (ref 0.7–4.0)
Monocytes Absolute: 0.4 K/uL (ref 0.1–1.0)
Monocytes Relative: 6 %
Neutro Abs: 5 K/uL (ref 1.7–7.7)
Neutrophils Relative %: 73 %

## 2024-08-24 LAB — URINALYSIS, ROUTINE W REFLEX MICROSCOPIC
Bilirubin Urine: NEGATIVE
Glucose, UA: NEGATIVE mg/dL
Ketones, ur: NEGATIVE mg/dL
Nitrite: NEGATIVE
Protein, ur: NEGATIVE mg/dL
Specific Gravity, Urine: 1.017 (ref 1.005–1.030)
pH: 7 (ref 5.0–8.0)

## 2024-08-24 LAB — CBC
HCT: 39.1 % (ref 36.0–46.0)
Hemoglobin: 11.9 g/dL — ABNORMAL LOW (ref 12.0–15.0)
MCH: 27.7 pg (ref 26.0–34.0)
MCHC: 30.4 g/dL (ref 30.0–36.0)
MCV: 91.1 fL (ref 80.0–100.0)
Platelets: 346 K/uL (ref 150–400)
RBC: 4.29 MIL/uL (ref 3.87–5.11)
RDW: 13.9 % (ref 11.5–15.5)
WBC: 6.9 K/uL (ref 4.0–10.5)
nRBC: 0 % (ref 0.0–0.2)

## 2024-08-24 LAB — COMPREHENSIVE METABOLIC PANEL WITH GFR
ALT: <5 U/L (ref 0–44)
AST: 16 U/L (ref 15–41)
Albumin: 3.8 g/dL (ref 3.5–5.0)
Alkaline Phosphatase: 82 U/L (ref 38–126)
Anion gap: 11 (ref 5–15)
BUN: 17 mg/dL (ref 8–23)
CO2: 28 mmol/L (ref 22–32)
Calcium: 9.8 mg/dL (ref 8.9–10.3)
Chloride: 102 mmol/L (ref 98–111)
Creatinine, Ser: 0.7 mg/dL (ref 0.44–1.00)
GFR, Estimated: >60 mL/min (ref >60)
Glucose, Bld: 81 mg/dL (ref 70–99)
Potassium: 4.4 mmol/L (ref 3.5–5.1)
Sodium: 140 mmol/L (ref 135–145)
Total Bilirubin: 0.5 mg/dL (ref 0.0–1.2)
Total Protein: 7 g/dL (ref 6.5–8.1)

## 2024-08-24 LAB — POC OCCULT BLOOD, ED: Fecal Occult Bld: NEGATIVE

## 2024-08-24 LAB — LIPASE, BLOOD: Lipase: 21 U/L (ref 11–51)

## 2024-08-24 MED ORDER — CEFADROXIL 500 MG PO CAPS: 500.0000 mg | ORAL_CAPSULE | Freq: Two times a day (BID) | ORAL | 0 refills | Status: AC

## 2024-08-24 MED ORDER — IOHEXOL 300 MG/ML  SOLN
75.0000 mL | Freq: Once | INTRAMUSCULAR | Status: AC | PRN
  Administered 2024-08-24: 75 mL via INTRAVENOUS

## 2024-08-24 MED ORDER — DOCUSATE SODIUM 100 MG PO CAPS: 100.0000 mg | ORAL_CAPSULE | Freq: Every day | ORAL | 0 refills | Status: AC

## 2024-08-24 MED ORDER — ACETAMINOPHEN 500 MG PO TABS
1000.0000 mg | ORAL_TABLET | Freq: Once | ORAL | Status: AC
  Administered 2024-08-24: 1000 mg via ORAL
  Filled 2024-08-24: qty 2

## 2024-08-24 NOTE — ED Provider Notes (Signed)
 [place] EMERGENCY DEPARTMENT AT [facility] Provider Note   CSN: [id number] Arrival date & time: 08/24/24  9957     Patient presents with: Diarrhea   [name] is a 88 y.o. female.   The history is provided by the patient and medical records.  Diarrhea  88 y.o. F with history of COPD on chronic oxygen, depression, hypertension, chronic back pain, history of GI bleeding, presenting to the ED for diarrhea.  Reports this has been ongoing x3 days.  States initially she felt constipated but had a large BM a few days ago, since then has been having loose, watery diarrhea.  Denies any bloody stools.  Reports she feels bloated.  Ultimately called EMS today because her son was having issues cleaning her up.  Denies fever/chills.  No vomiting.  Has had prior hysterectomy.  Prior to Admission medications   Medication Sig Start Date End Date Taking? Authorizing Provider  acetaminophen  (TYLENOL ) 325 MG tablet Take 2 tablets (650 mg total) by mouth every 6 (six) hours as needed for mild pain (or Fever >/= 101). 02/16/22   Samtani, Jai-Gurmukh, MD  albuterol  (VENTOLIN  HFA) 108 (90 Base) MCG/ACT inhaler Inhale 2 puffs into the lungs every 4 (four) hours as needed for wheezing, cough or shortness of breath.    [provider]  aspirin  EC 81 MG tablet Take 81 mg by mouth daily. Swallow whole.    [provider]  cholecalciferol  (CHOLECALCIFEROL ) 25 MCG tablet Take 1 tablet (1,000 Units total) by mouth daily. 05/23/23   [name], MD  DULoxetine  (CYMBALTA ) 60 MG capsule Take 60 mg by mouth daily.    [provider]  ferrous sulfate  325 (65 FE) MG EC tablet Take 325 mg by mouth daily. 02/02/23   [provider]  furosemide  (LASIX ) 20 MG tablet Take 1 tablet (20 mg total) by mouth every other day as needed for edema or fluid. 05/22/23 05/21/24  [name], MD  furosemide  (LASIX ) 20 MG tablet Take 20 mg by mouth daily. Take 1 Tablet Daily As  Needed For Weight Gain of 3 lbs. Overnight and 5 lbs. In a Week    [provider]  lidocaine  (LIDODERM ) 5 % Place 1 patch onto the skin daily. Remove & Discard patch within 12 hours or as directed by MD 05/23/23   [name], MD  oxyCODONE  (OXY IR/ROXICODONE ) 5 MG immediate release tablet Take 0.5 tablets (2.5 mg total) by mouth every 6 (six) hours as needed for severe pain or moderate pain. 05/22/23   [name], MD  polyethylene glycol (MIRALAX  / GLYCOLAX ) 17 g packet Take 17 g by mouth 3 (three) times daily as needed for mild constipation or moderate constipation. 05/22/23   [name], MD  senna-docusate (SENOKOT-S) 8.6-50 MG tablet Take 1 tablet by mouth 2 (two) times daily. 05/22/23   [name], MD  thiamine  (VITAMIN B-1) 100 MG tablet Take 1 tablet (100 mg total) by mouth daily. 05/23/23   [name], MD    Allergies: Ambien  [zolpidem ] and Codeine     Review of Systems  Gastrointestinal:  Positive for diarrhea.  All other systems reviewed and are negative.   Updated Vital Signs BP (!) 122/52   Pulse 64   Temp 98.5 F (36.9 C) (Oral)   Resp 18   SpO2 95%   Physical Exam Vitals and nursing note reviewed.  Constitutional:      Appearance: She is well-developed.  Comments: elderly  HENT:     Head: Normocephalic and atraumatic.  Eyes:     Conjunctiva/sclera: Conjunctivae normal.     Pupils: Pupils are equal, round, and reactive to light.  Cardiovascular:     Rate and Rhythm: Normal rate and regular rhythm.     Heart sounds: Normal heart sounds.  Pulmonary:     Effort: Pulmonary effort is normal.     Breath sounds: Normal breath sounds.     Comments: 3L O2 in use (baseline) Abdominal:     General: Bowel sounds are normal.     Palpations: Abdomen is soft.     Comments: Overall non-tender, not distended in appearance  Musculoskeletal:        General: Normal range of motion.     Cervical back: Normal range of motion.   Skin:    General: Skin is warm and dry.  Neurological:     Mental Status: She is alert and oriented to person, place, and time.     (all labs ordered are listed, but only abnormal results are displayed) Labs Reviewed  CBC - Abnormal; Notable for the following components:      Result Value   Hemoglobin 11.9 (*)    All other components within normal limits  URINE CULTURE  LIPASE, BLOOD  COMPREHENSIVE METABOLIC PANEL WITH GFR  DIFFERENTIAL  URINALYSIS, ROUTINE W REFLEX MICROSCOPIC  POC OCCULT BLOOD, ED    EKG: None  Radiology: No results found.   Procedures   Medications Ordered in the ED - No data to display                                  Medical Decision Making Amount and/or Complexity of Data Reviewed Labs: ordered. Radiology: ordered and independent interpretation performed. ECG/medicine tests: ordered and independent interpretation performed.  Risk Prescription drug management.   88 year old female presenting to the ED with diarrhea for the past 3 days.  Initially felt constipated but had a large BM, reports some diarrhea since then.  States she still feels bloated.  Denies any bloody stools.  She is afebrile and nontoxic in appearance here.  She is on her baseline 3 L supplemental O2.  Denies any change in respiratory status.  Her abdomen is soft and nontender, no peritoneal signs.    Labs as above--no leukocytosis.  Hemoglobin is stable at 11.9.  No significant electrolyte derangement.  Normal lipase.  Denies any sick contacts or recent antibiotics use.  Will obtain CT to assess for possible colitis or similar.  6:36 AM Patient in CT now.  Care signed out to oncoming provider.  Final diagnoses:  Diarrhea, unspecified type    ED Discharge Orders     None          Jarold Olam HERO, PA-C 08/24/24 0636    Carita Senior, MD 08/24/24 [phone]

## 2024-08-24 NOTE — ED Triage Notes (Signed)
 Pt comes via [facility] from home for diarrhea for the past 3 days, wears 3L of O2 at all times

## 2024-08-24 NOTE — Discharge Instructions (Signed)
 Take colace daily, if you have not had a bowel movement in 2-3 days can use 1 cap full of miralax . You urine showed some bacteria take antibiotics twice daily for the next 7 days. Call to schedule close follow up with your primary doctor,

## 2024-08-24 NOTE — ED Provider Notes (Signed)
 Care assumed from PA Olam Slocumb at shift change, please see her note for full details, in brief [name] is a 88 y.o. female presented with diarrhea for 3 days after initially being constipated.  Signed out pending CT scan of the abdomen pelvis, lab work has been reassuring.  Physical Exam  BP 115/64 (BP Location: Right Arm)   Pulse 100   Temp 98 F (36.7 C) (Oral)   Resp 17   SpO2 100%   Physical Exam Vitals and nursing note reviewed.  Constitutional:      General: She is not in acute distress.    Appearance: Normal appearance. She is well-developed. She is not ill-appearing or diaphoretic.     Comments: Elderly female alert and in no acute distress  HENT:     Head: Normocephalic and atraumatic.  Eyes:     General:        Right eye: No discharge.        Left eye: No discharge.  Pulmonary:     Effort: Pulmonary effort is normal. No respiratory distress.  Abdominal:     General: There is no distension.     Palpations: There is no mass.     Tenderness: There is no abdominal tenderness.  Genitourinary:    Comments: Chaperone present during rectal exam, after enema large collection of firm stool has moved down.  Patient unable to stool, large amount of soft brown stool removed with manual disimpaction Neurological:     Mental Status: She is alert and oriented to person, place, and time.     Coordination: Coordination normal.  Psychiatric:        Mood and Affect: Mood normal.        Behavior: Behavior normal.     Procedures  Procedures  ED Course / MDM   Labs Reviewed  URINALYSIS, ROUTINE W REFLEX MICROSCOPIC - Abnormal; Notable for the following components:      Result Value   APPearance HAZY (*)    Hgb urine dipstick LARGE (*)    Leukocytes,Ua LARGE (*)    Bacteria, UA RARE (*)    All other components within normal limits  CBC - Abnormal; Notable for the following components:   Hemoglobin 11.9 (*)    All other components within normal limits  URINE CULTURE   LIPASE, BLOOD  COMPREHENSIVE METABOLIC PANEL WITH GFR  DIFFERENTIAL  POC OCCULT BLOOD, ED   CT ABDOMEN PELVIS W CONTRAST Result Date: 08/24/2024 EXAM: CT ABDOMEN AND PELVIS WITH CONTRAST 08/24/2024 06:39:39 AM TECHNIQUE: CT of the abdomen and pelvis was performed with the administration of intravenous contrast. Multiplanar reformatted images are provided for review. Automated exposure control, iterative reconstruction, and/or weight-based adjustment of the mA/kV was utilized to reduce the radiation dose to as low as reasonably achievable. COMPARISON: 05/14/2023 CLINICAL HISTORY: Diarrhea; diarrhea, abdominal pain. Diarrhea for the past 3 days, GFR>60. FINDINGS: LOWER CHEST: Chronic scarring and volume loss within the lingula and left base. Left posterior diaphragmatic hernia contains fat only. Diffuse bronchial wall thickening and mild bronchiectasis. No pleural effusion or consolidative change noted. LIVER: Normal size and contour. GALLBLADDER AND BILE DUCTS: There is fusiform dilatation of the common bile duct measuring up to 1.1 cm which is also unchanged. No calcified stones identified along the course of the common bile duct. SPLEEN: Normal size. No focal lesion. PANCREAS: No mass. No ductal dilatation. ADRENAL GLANDS: Normal appearance. No mass. KIDNEYS, URETERS AND BLADDER: There is a stone identified within the dependent infundibulum of  the right extrarenal pelvis measuring 6 mm, image 19/2. No right hydronephrosis. Large left kidney staghorn calculus is identified on today's exam, similar in size and configuration to the prior study, measuring up to 4.8 cm. Mild asymmetric left renal atrophy compared with the right. No left-sided hydronephrosis. Urinary bladder appears normal. GI AND BOWEL: Large or small bowel dilatation or inflammation . Within the left lower quadrant of the abdomen there is a ventral hernia which contains a loop of non-obstructed proximal sigmoid colon, axial 53/2. This appears  similar to the previous exam. The appendix is not confidently identified. There is a moderate amount of desiccated stool identified within the rectum. PERITONEUM AND RETROPERITONEUM: No free air. VASCULATURE: Extensive aortic atherosclerotic calcification. No aneurysm identified. LYMPH NODES: No signs of abdominal or pelvic adenopathy. REPRODUCTIVE ORGANS: Uterus appears surgically absent. No adnexal mass. BONES AND SOFT TISSUES: Bones are diffusely osteopenic. Status post bilateral sacroplasty. Vertebral blasting has been performed at the T11, L1 and L3 level. No acute or suspicious osseous findings identified at this time. Several small midline ventral abdominal wall hernias are identified which contain fat only. IMPRESSION: 1. No acute findings within the abdomen or pelvis. No signs of bowel obstruction or inflammation. 2. Large left kidney staghorn calculus, similar in size and configuration to the prior study, measuring up to 4.8 cm. 3. Stone within the dependent infection of the right extrarenal pelvis measuring 6 mm, no right hydronephrosis. 4. Unchanged common bile duct dilatation and mild intrahepatic bile duct dilatation without signs of calcified choledocholithiasis or obstructing mass. 5. Ventral hernia in the left lower quadrant containing a loop of non-obstructed proximal sigmoid colon, similar to the previous exam. Electronically signed by: Waddell Calk MD 08/24/2024 07:22 AM EDT RP Workstation: [id number]    Medical Decision Making Amount and/or Complexity of Data Reviewed Labs: ordered. Radiology: ordered.  Risk OTC drugs. Prescription drug management.   CT head without acute intra-abdominal abnormality, large amount of stool noted just proximal to the rectum.  After enema patient still unable to pass stool.  Able to remove large amount of stool with manual disimpaction.  At this time patient is appropriate for discharge home, will have patient use daily stool softener and MiraLAX  if  no bowel movement in 2 to 3 days.  Called and discussed these recommendations with the patient's son.  Her urinalysis does show some bacteria we will treat with cefadroxil .  Discharged home in good condition.       Alva Larraine FALCON, PA-C 08/24/24 1251    Mannie Pac T, DO 08/24/24 [phone]

## 2024-08-24 NOTE — ED Notes (Signed)
 Called son Garrel, son is not at home, but neighbor Dick pick the call and confirm he is in the house and Garrel is on the way home.

## 2024-08-26 LAB — URINE CULTURE: Culture: 70000 — AB

## 2024-08-27 ENCOUNTER — Telehealth (HOSPITAL_BASED_OUTPATIENT_CLINIC_OR_DEPARTMENT_OTHER): Payer: Self-pay | Admitting: *Deleted

## 2024-08-27 NOTE — Telephone Encounter (Signed)
 Post ED Visit - Positive Culture Follow-up  Culture report reviewed by antimicrobial stewardship pharmacist: [facility] []  Rankin Dee, Pharm.D. []  Venetia Gully, Pharm.D., BCPS AQ-ID []  Garrel Crews, Pharm.D., BCPS []  Almarie Lunger, Pharm.D., BCPS []  [place], [address].D., BCPS, AAHIVP []  Rosaline Bihari, Pharm.D., BCPS, AAHIVP []  Vernell Meier, PharmD, BCPS []  Latanya Hint, PharmD, BCPS []  Donald Medley, PharmD, BCPS []  Rocky Bold, PharmD []  Dorothyann Alert, PharmD, BCPS []  Morene Babe, PharmD  [facility] []  Rosaline Edison, PharmD []  Romona Bliss, PharmD []  Dolphus Roller, PharmD []  Veva Seip, Rph []  Vernell Daunt) Leonce, PharmD []  Eva Allis, PharmD []  Rosaline Millet, PharmD []  Iantha Batch, PharmD []  Arvin Gauss, PharmD []  Wanda Hasting, PharmD []  Ronal Rav, PharmD []  Rocky Slade, PharmD [x]  Eleanor Agent, PharmD   Positive urine culture Treated with Cefadrolxil, organism sensitive to the same and no further patient follow-up is required at this time.  Albino Alan Novak 08/27/2024, 10:51 AM

## 2024-08-31 ENCOUNTER — Other Ambulatory Visit: Payer: Self-pay

## 2024-08-31 ENCOUNTER — Encounter (HOSPITAL_COMMUNITY): Payer: Self-pay

## 2024-08-31 ENCOUNTER — Emergency Department (HOSPITAL_COMMUNITY): Admission: EM | Admit: 2024-08-31 | Discharge: 2024-08-31 | Disposition: A | Discharge status: Home / Self Care

## 2024-08-31 DIAGNOSIS — K5900 Constipation, unspecified: Secondary | ICD-10-CM | POA: Diagnosis present

## 2024-08-31 DIAGNOSIS — K5641 Fecal impaction: Secondary | ICD-10-CM | POA: Diagnosis not present

## 2024-08-31 DIAGNOSIS — Z7982 Long term (current) use of aspirin: Secondary | ICD-10-CM | POA: Insufficient documentation

## 2024-08-31 NOTE — ED Triage Notes (Signed)
 Pt BIB [facility] from home c/o abd pain, buttocks pain, constipation x 1 day. Seen at [facility] last week. Laxative taken today along with antidiarrheal medication. Wears 3-4L of [place] at baseline.

## 2024-08-31 NOTE — ED Provider Notes (Signed)
 [place] EMERGENCY DEPARTMENT AT [facility] Provider Note   CSN: [id number] Arrival date & time: 08/31/24  1319     Patient presents with: Constipation   [name] is a 88 y.o. female with history of fecal impaction who presents to the emergency department today for further evaluation of fecal impaction.  She states her last known bowel movement was approximately 2 weeks ago.  She has been taking antidiarrheals and suppositories.  She was seen evaluated for similar symptoms at [facility] on 08/24/2024.  CT scan was done at that time there was no intra-abdominal abnormalities.  There was a large amount of stool noted in the proximal rectum.  Manual disimpaction was done at that time.  Complaining of rectal pain today.  No nausea, abdominal pain, vomiting, fever, chills.    Constipation      Prior to Admission medications   Medication Sig Start Date End Date Taking? Authorizing Provider  acetaminophen  (TYLENOL ) 325 MG tablet Take 2 tablets (650 mg total) by mouth every 6 (six) hours as needed for mild pain (or Fever >/= 101). 02/16/22   Samtani, Jai-Gurmukh, MD  albuterol  (VENTOLIN  HFA) 108 (90 Base) MCG/ACT inhaler Inhale 2 puffs into the lungs every 4 (four) hours as needed for wheezing, cough or shortness of breath.    [provider]  aspirin  EC 81 MG tablet Take 81 mg by mouth daily. Swallow whole.    [provider]  cefadroxil  (DURICEF) 500 MG capsule Take 1 capsule (500 mg total) by mouth 2 (two) times daily for 7 days. 08/24/24 08/31/24  Kehrli, Kelsey F, PA-C  cholecalciferol  (CHOLECALCIFEROL ) 25 MCG tablet Take 1 tablet (1,000 Units total) by mouth daily. 05/23/23   [name], MD  docusate sodium  (COLACE) 100 MG capsule Take 1 capsule (100 mg total) by mouth daily. 08/24/24   Alva Larraine FALCON, PA-C  DULoxetine  (CYMBALTA ) 60 MG capsule Take 60 mg by mouth daily.    [provider]  ferrous sulfate  325 (65 FE) MG EC tablet Take 325 mg  by mouth daily. 02/02/23   [provider]  furosemide  (LASIX ) 20 MG tablet Take 1 tablet (20 mg total) by mouth every other day as needed for edema or fluid. 05/22/23 05/21/24  [name], MD  furosemide  (LASIX ) 20 MG tablet Take 20 mg by mouth daily. Take 1 Tablet Daily As Needed For Weight Gain of 3 lbs. Overnight and 5 lbs. In a Week    [provider]  lidocaine  (LIDODERM ) 5 % Place 1 patch onto the skin daily. Remove & Discard patch within 12 hours or as directed by MD 05/23/23   [name], MD  oxyCODONE  (OXY IR/ROXICODONE ) 5 MG immediate release tablet Take 0.5 tablets (2.5 mg total) by mouth every 6 (six) hours as needed for severe pain or moderate pain. 05/22/23   [name], MD  polyethylene glycol (MIRALAX  / GLYCOLAX ) 17 g packet Take 17 g by mouth 3 (three) times daily as needed for mild constipation or moderate constipation. 05/22/23   [name], MD  senna-docusate (SENOKOT-S) 8.6-50 MG tablet Take 1 tablet by mouth 2 (two) times daily. 05/22/23   [name], MD  thiamine  (VITAMIN B-1) 100 MG tablet Take 1 tablet (100 mg total) by mouth daily. 05/23/23   [name], MD    Allergies: Ambien  Bette ] and Codeine     Review of Systems  Gastrointestinal:  Positive for constipation.  All other systems reviewed  and are negative.   Updated Vital Signs BP 126/80 (BP Location: Right Arm)   Pulse 88   Temp 98.1 F (36.7 C) (Oral)   Resp 16   Ht 5' (1.524 m)   Wt 36.3 kg   SpO2 98%   BMI 15.62 kg/m   Physical Exam Vitals and nursing note reviewed.  Constitutional:      General: She is not in acute distress.    Appearance: Normal appearance.  HENT:     Head: Normocephalic and atraumatic.  Eyes:     General:        Right eye: No discharge.        Left eye: No discharge.  Cardiovascular:     Comments: Regular rate and rhythm.  S1/S2 are distinct without any evidence of murmur, rubs, or gallops.  Radial pulses are 2+  bilaterally.  Dorsalis pedis pulses are 2+ bilaterally.  No evidence of pedal edema. Pulmonary:     Comments: Clear to auscultation bilaterally.  Normal effort.  No respiratory distress.  No evidence of wheezes, rales, or rhonchi heard throughout. Abdominal:     General: Abdomen is flat. Bowel sounds are normal. There is no distension.     Tenderness: There is no abdominal tenderness. There is no guarding or rebound.  Genitourinary:    Comments: Chaperone was present during the rectal exam.  Large amount of stool in the rectal vault that was  Dried and hard.  I performed manual disimpaction and got some stool out. Musculoskeletal:        General: Normal range of motion.     Cervical back: Neck supple.  Skin:    General: Skin is warm and dry.     Findings: No rash.  Neurological:     General: No focal deficit present.     Mental Status: She is alert.  Psychiatric:        Mood and Affect: Mood normal.        Behavior: Behavior normal.     (all labs ordered are listed, but only abnormal results are displayed) Labs Reviewed - No data to display  EKG: None  Radiology: No results found.   .Fecal disimpaction  Date/Time: 08/31/2024 3:22 PM  Performed by: Theotis Cameron HERO, PA-C Authorized by: Theotis Cameron HERO, PA-C  Consent: Verbal consent obtained Consent given by: patient Patient understanding: patient states understanding of the procedure being performed Patient consent: the patient's understanding of the procedure matches consent given Procedure consent: procedure consent matches procedure scheduled Relevant documents: relevant documents present and verified Test results: test results available and properly labeled Site marked: the operative site was marked Imaging studies: imaging studies available Patient identity confirmed: verbally with patient Preparation: Patient was prepped and draped in the usual sterile fashion. Local anesthesia used: no  Anesthesia: Local  anesthesia used: no  Sedation: Patient sedated: no  Patient tolerance: patient tolerated the procedure well with no immediate complications Comments: All of the stool was evacuated from the rectal vault. Patient noted immediate improvement.       Medications Ordered in the ED - No data to display   Medical Decision Making [name] is a 88 y.o. female patient who presents to the emergency department today for further evaluation of fecal impaction.  I attempted manual disimpaction.  Got some stool out.  Will try enema.  Low suspicion for bowel obstruction at this time.  She had a normal CT scan last week.  Do not feel that we need  to repeat this today.  Second attempt at fecal disimpaction was successful.  Soapsuds enema was ineffective.  Patient noted immediate improvement after manual disimpaction.  Large amount of stool was taken out of the rectal vault.  No evidence of blood.  Will have her follow-up with her primary care physician.  Strict return precautions were discussed.  We also discussed appropriate fiber intake, fluids, and for her to stop taking Imodium.  She is safe for discharge.     Final diagnoses:  Fecal impaction [facility])    ED Discharge Orders     None          Theotis Peers [place], [place] 08/31/24 1524    Ula Prentice SAUNDERS, MD 08/31/24 [phone]

## 2024-08-31 NOTE — Discharge Instructions (Signed)
 As we discussed, I would not highly recommend you stop taking Imodium this is likely contributing to the fecal impactions you keep getting.  I would recommend starting to take some Colace and fiber.  Please drink plenty of fluids.  I would like for you to follow-up with your primary care doctor for further evaluation.  You may return to the emergency room for any worsening symptoms.

## 2024-08-31 NOTE — ED Notes (Signed)
 Soap suds enema completed with no stool output.

## 2024-09-15 ENCOUNTER — Emergency Department (HOSPITAL_COMMUNITY)
Admission: EM | Admit: 2024-09-15 | Discharge: 2024-09-16 | Disposition: A | Discharge status: Home / Self Care | Attending: Emergency Medicine | Admitting: Emergency Medicine

## 2024-09-15 ENCOUNTER — Emergency Department (HOSPITAL_COMMUNITY)

## 2024-09-15 ENCOUNTER — Other Ambulatory Visit: Payer: Self-pay

## 2024-09-15 ENCOUNTER — Encounter (HOSPITAL_COMMUNITY): Payer: Self-pay

## 2024-09-15 DIAGNOSIS — Z7982 Long term (current) use of aspirin: Secondary | ICD-10-CM | POA: Diagnosis not present

## 2024-09-15 DIAGNOSIS — M79671 Pain in right foot: Secondary | ICD-10-CM | POA: Diagnosis present

## 2024-09-15 DIAGNOSIS — Z23 Encounter for immunization: Secondary | ICD-10-CM | POA: Diagnosis not present

## 2024-09-15 LAB — CBC
HCT: 38.1 % (ref 36.0–46.0)
Hemoglobin: 11.2 g/dL — ABNORMAL LOW (ref 12.0–15.0)
MCH: 26.7 pg (ref 26.0–34.0)
MCHC: 29.4 g/dL — ABNORMAL LOW (ref 30.0–36.0)
MCV: 90.7 fL (ref 80.0–100.0)
Platelets: 383 K/uL (ref 150–400)
RBC: 4.2 MIL/uL (ref 3.87–5.11)
RDW: 13.6 % (ref 11.5–15.5)
WBC: 7.5 K/uL (ref 4.0–10.5)
nRBC: 0 % (ref 0.0–0.2)

## 2024-09-15 LAB — BASIC METABOLIC PANEL WITH GFR
Anion gap: 11 (ref 5–15)
BUN: 15 mg/dL (ref 8–23)
CO2: 27 mmol/L (ref 22–32)
Calcium: 8.9 mg/dL (ref 8.9–10.3)
Chloride: 103 mmol/L (ref 98–111)
Creatinine, Ser: 0.59 mg/dL (ref 0.44–1.00)
GFR, Estimated: >60 mL/min (ref >60)
Glucose, Bld: 100 mg/dL — ABNORMAL HIGH (ref 70–99)
Potassium: 4 mmol/L (ref 3.5–5.1)
Sodium: 140 mmol/L (ref 135–145)

## 2024-09-15 MED ORDER — ACETAMINOPHEN 500 MG PO TABS
1000.0000 mg | ORAL_TABLET | Freq: Once | ORAL | Status: AC
  Administered 2024-09-15: 1000 mg via ORAL
  Filled 2024-09-15: qty 2

## 2024-09-15 MED ORDER — IOHEXOL 350 MG/ML SOLN
80.0000 mL | Freq: Once | INTRAVENOUS | Status: AC | PRN
  Administered 2024-09-15: 80 mL via INTRAVENOUS

## 2024-09-15 MED ORDER — TETANUS-DIPHTH-ACELL PERTUSSIS 5-2.5-18.5 LF-MCG/0.5 IM SUSY
0.5000 mL | PREFILLED_SYRINGE | Freq: Once | INTRAMUSCULAR | Status: AC
  Administered 2024-09-15: 0.5 mL via INTRAMUSCULAR
  Filled 2024-09-15: qty 0.5

## 2024-09-15 NOTE — ED Notes (Signed)
 Patient stated she needed to use the bathroom. Nothing was in the bedpan. Bladder scan patient. Only 9ml RN made aware

## 2024-09-15 NOTE — ED Triage Notes (Signed)
 Pt BIB EMS from home for left sided foot and flank pain. Pt stated when she takes a deep breath the flank pain increases. Pt stated she's short of breath as well.  O2 2L at home

## 2024-09-15 NOTE — Discharge Instructions (Signed)
 Return for any problem.  ?

## 2024-09-15 NOTE — ED Provider Notes (Signed)
 [place] EMERGENCY DEPARTMENT AT [facility] Provider Note   CSN: [id number] Arrival date & time: 09/15/24  1835     Patient presents with: Shortness of Breath and Flank Pain (/)   [name] is a 88 y.o. female.   88 year old female with prior medical history as detailed below presents for evaluation.  Patient complains of right foot pain primarily.  She uses a walker to ambulate at home.  She lives with her son.  She reports increased pain to the right foot x 1 week.  The history is provided by the patient and medical records.       Prior to Admission medications   Medication Sig Start Date End Date Taking? Authorizing Provider  acetaminophen  (TYLENOL ) 325 MG tablet Take 2 tablets (650 mg total) by mouth every 6 (six) hours as needed for mild pain (or Fever >/= 101). 02/16/22   Samtani, Jai-Gurmukh, MD  albuterol  (VENTOLIN  HFA) 108 (90 Base) MCG/ACT inhaler Inhale 2 puffs into the lungs every 4 (four) hours as needed for wheezing, cough or shortness of breath.    [provider]  aspirin  EC 81 MG tablet Take 81 mg by mouth daily. Swallow whole.    [provider]  cholecalciferol  (CHOLECALCIFEROL ) 25 MCG tablet Take 1 tablet (1,000 Units total) by mouth daily. 05/23/23   [name], MD  docusate sodium  (COLACE) 100 MG capsule Take 1 capsule (100 mg total) by mouth daily. 08/24/24   Alva Larraine FALCON, PA-C  DULoxetine  (CYMBALTA ) 60 MG capsule Take 60 mg by mouth daily.    [provider]  ferrous sulfate  325 (65 FE) MG EC tablet Take 325 mg by mouth daily. 02/02/23   [provider]  furosemide  (LASIX ) 20 MG tablet Take 1 tablet (20 mg total) by mouth every other day as needed for edema or fluid. 05/22/23 05/21/24  [name], MD  furosemide  (LASIX ) 20 MG tablet Take 20 mg by mouth daily. Take 1 Tablet Daily As Needed For Weight Gain of 3 lbs. Overnight and 5 lbs. In a Week    [provider]  lidocaine  (LIDODERM ) 5  % Place 1 patch onto the skin daily. Remove & Discard patch within 12 hours or as directed by MD 05/23/23   [name], MD  oxyCODONE  (OXY IR/ROXICODONE ) 5 MG immediate release tablet Take 0.5 tablets (2.5 mg total) by mouth every 6 (six) hours as needed for severe pain or moderate pain. 05/22/23   [name], MD  polyethylene glycol (MIRALAX  / GLYCOLAX ) 17 g packet Take 17 g by mouth 3 (three) times daily as needed for mild constipation or moderate constipation. 05/22/23   [name], MD  senna-docusate (SENOKOT-S) 8.6-50 MG tablet Take 1 tablet by mouth 2 (two) times daily. 05/22/23   [name], MD  thiamine  (VITAMIN B-1) 100 MG tablet Take 1 tablet (100 mg total) by mouth daily. 05/23/23   [name], MD    Allergies: Ambien  [zolpidem ] and Codeine     Review of Systems  All other systems reviewed and are negative.   Updated Vital Signs BP (!) 140/70   Pulse 79   Temp 97.8 F (36.6 C) (Oral)   Resp 19   SpO2 94%   Physical Exam Vitals and nursing note reviewed.  Constitutional:      General: She is not in acute distress.    Appearance: Normal appearance. She is well-developed.  HENT:     Head: Normocephalic  and atraumatic.  Eyes:     Conjunctiva/sclera: Conjunctivae normal.     Pupils: Pupils are equal, round, and reactive to light.  Cardiovascular:     Rate and Rhythm: Normal rate and regular rhythm.     Heart sounds: Normal heart sounds.  Pulmonary:     Effort: Pulmonary effort is normal. No respiratory distress.     Breath sounds: Normal breath sounds.  Abdominal:     General: There is no distension.     Palpations: Abdomen is soft.     Tenderness: There is no abdominal tenderness.  Musculoskeletal:        General: No deformity. Normal range of motion.     Cervical back: Normal range of motion and neck supple.     Comments: Small metallic pin removed from where it was partially inserted into the right second toe.  See image below.   Skin:    General: Skin is warm and dry.  Neurological:     General: No focal deficit present.     Mental Status: She is alert and oriented to person, place, and time.     (all labs ordered are listed, but only abnormal results are displayed) Labs Reviewed  BASIC METABOLIC PANEL WITH GFR - Abnormal; Notable for the following components:      Result Value   Glucose, Bld 100 (*)    All other components within normal limits  CBC - Abnormal; Notable for the following components:   Hemoglobin 11.2 (*)    MCHC 29.4 (*)    All other components within normal limits    EKG: None  Radiology: No results found.   Procedures   Medications Ordered in the ED  Tdap (BOOSTRIX) injection 0.5 mL (has no administration in time range)                                    Medical Decision Making Patient presented with primary complaint of right foot pain.  On exam a small metallic needle was stuck in her right second toe.  This was removed without difficulty.  I suspect this was the cause of her foot pain.  Subsequently, patient complained of right-sided chest discomfort.  She reported increased pain with breathing.  CTA fortunately is without evidence of acute pathology.    On reevaluation the patient feels much improved.  She desires discharge.  She declines additional workup and/or observation.  Importance of close follow-up was stressed.  Strict return precautions given and understood.  Amount and/or Complexity of Data Reviewed Labs: ordered. Radiology: ordered.  Risk OTC drugs. Prescription drug management.        Final diagnoses:  Right foot pain    ED Discharge Orders     None          Laurice Maude BROCKS, MD 09/15/24 2244

## 2024-09-15 NOTE — ED Notes (Addendum)
 [facility] called and transportation setup for Pt to return back home.Per [facility], Pt is 5th on the list.

## 2024-09-16 MED ORDER — ACETAMINOPHEN 325 MG PO TABS
650.0000 mg | ORAL_TABLET | Freq: Once | ORAL | Status: AC
  Administered 2024-09-16: 650 mg via ORAL
  Filled 2024-09-16: qty 2

## 2024-09-16 NOTE — ED Notes (Signed)
 This RN called to get an update for Pt. Pt remains on the list but no update on [facility]'s arrival at this time.
